# Patient Record
Sex: Female | Born: 1935 | Race: White | Hispanic: No | Smoking: Never smoker
Health system: Southern US, Community
[De-identification: ages and names within clinical notes are randomized; demographics above are authoritative.]

## PROBLEM LIST (undated history)

## (undated) ENCOUNTER — Emergency Department (HOSPITAL_COMMUNITY): Admission: EM | Payer: No Typology Code available for payment source

## (undated) DIAGNOSIS — Z9889 Other specified postprocedural states: Secondary | ICD-10-CM

## (undated) DIAGNOSIS — Z87440 Personal history of urinary (tract) infections: Secondary | ICD-10-CM

## (undated) DIAGNOSIS — Z973 Presence of spectacles and contact lenses: Secondary | ICD-10-CM

## (undated) DIAGNOSIS — K219 Gastro-esophageal reflux disease without esophagitis: Secondary | ICD-10-CM

## (undated) DIAGNOSIS — IMO0002 Reserved for concepts with insufficient information to code with codable children: Secondary | ICD-10-CM

## (undated) DIAGNOSIS — Z8042 Family history of malignant neoplasm of prostate: Secondary | ICD-10-CM

## (undated) DIAGNOSIS — Z860100 Personal history of colon polyps, unspecified: Secondary | ICD-10-CM

## (undated) DIAGNOSIS — G8929 Other chronic pain: Secondary | ICD-10-CM

## (undated) DIAGNOSIS — N312 Flaccid neuropathic bladder, not elsewhere classified: Secondary | ICD-10-CM

## (undated) DIAGNOSIS — E785 Hyperlipidemia, unspecified: Secondary | ICD-10-CM

## (undated) DIAGNOSIS — K449 Diaphragmatic hernia without obstruction or gangrene: Secondary | ICD-10-CM

## (undated) DIAGNOSIS — R112 Nausea with vomiting, unspecified: Secondary | ICD-10-CM

## (undated) DIAGNOSIS — N183 Chronic kidney disease, stage 3 unspecified: Secondary | ICD-10-CM

## (undated) DIAGNOSIS — Z923 Personal history of irradiation: Secondary | ICD-10-CM

## (undated) DIAGNOSIS — M545 Low back pain, unspecified: Secondary | ICD-10-CM

## (undated) DIAGNOSIS — C50411 Malignant neoplasm of upper-outer quadrant of right female breast: Principal | ICD-10-CM

## (undated) DIAGNOSIS — Z978 Presence of other specified devices: Secondary | ICD-10-CM

## (undated) DIAGNOSIS — Z803 Family history of malignant neoplasm of breast: Secondary | ICD-10-CM

## (undated) DIAGNOSIS — E119 Type 2 diabetes mellitus without complications: Secondary | ICD-10-CM

## (undated) DIAGNOSIS — R339 Retention of urine, unspecified: Secondary | ICD-10-CM

## (undated) DIAGNOSIS — Z800 Family history of malignant neoplasm of digestive organs: Secondary | ICD-10-CM

## (undated) DIAGNOSIS — I452 Bifascicular block: Secondary | ICD-10-CM

## (undated) DIAGNOSIS — D649 Anemia, unspecified: Secondary | ICD-10-CM

## (undated) DIAGNOSIS — Z974 Presence of external hearing-aid: Secondary | ICD-10-CM

## (undated) DIAGNOSIS — K5790 Diverticulosis of intestine, part unspecified, without perforation or abscess without bleeding: Secondary | ICD-10-CM

## (undated) DIAGNOSIS — M1990 Unspecified osteoarthritis, unspecified site: Secondary | ICD-10-CM

## (undated) DIAGNOSIS — Z972 Presence of dental prosthetic device (complete) (partial): Secondary | ICD-10-CM

## (undated) DIAGNOSIS — R600 Localized edema: Secondary | ICD-10-CM

## (undated) DIAGNOSIS — Z960 Presence of urogenital implants: Secondary | ICD-10-CM

## (undated) HISTORY — PX: BACK SURGERY: SHX140

## (undated) HISTORY — DX: Family history of malignant neoplasm of prostate: Z80.42

## (undated) HISTORY — DX: Diverticulosis of intestine, part unspecified, without perforation or abscess without bleeding: K57.90

## (undated) HISTORY — DX: Family history of malignant neoplasm of digestive organs: Z80.0

## (undated) HISTORY — DX: Gastro-esophageal reflux disease without esophagitis: K21.9

## (undated) HISTORY — PX: BREAST SURGERY: SHX581

## (undated) HISTORY — PX: TOTAL KNEE ARTHROPLASTY: SHX125

## (undated) HISTORY — DX: Unspecified osteoarthritis, unspecified site: M19.90

## (undated) HISTORY — DX: Malignant neoplasm of upper-outer quadrant of right female breast: C50.411

## (undated) HISTORY — PX: JOINT REPLACEMENT: SHX530

## (undated) HISTORY — DX: Reserved for concepts with insufficient information to code with codable children: IMO0002

## (undated) HISTORY — DX: Family history of malignant neoplasm of breast: Z80.3

## (undated) HISTORY — PX: HERNIA REPAIR: SHX51

## (undated) HISTORY — PX: CATARACT EXTRACTION W/ INTRAOCULAR LENS  IMPLANT, BILATERAL: SHX1307

## (undated) HISTORY — PX: EYE SURGERY: SHX253

## (undated) NOTE — *Deleted (*Deleted)
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  History of chronic back pain with decompression microdiscectomy 2017.  Per chart review patient lives with spouse.  1 level home with ramped entrance.  Husband is limited physically.  Daughter does assist both both patient and her father mid afternoon to dinnertime daily.  Patient used a rolling walker prior to admission.  Patient has been at [facility] in the past for debility.  Presented 04/24/2020 with complaints of increasing back pain radiating to the lower extremities.  MRI and imaging lumbar showed multilevel lumbar spinal listhesis and spinal stenosis.  Patient underwent bilateral redo of L2-3 3-4 and L4-5 laminotomy/laminectomy/foraminotomies to decompress the bilateral L3-4 and L5 nerve roots as well as transforaminal lumbar interbody fusion 04/24/2020 per Dr. Arnoldo Morale.  Back brace when out of bed.  Hospital course anemia 6.9-7.5 as well as bouts of hypotension.  Bouts of hypoglycemia 04/28/2020 of 35 and received diabetic gel.  Therapy evaluations completed and patient was admitted for a comprehensive rehab program.  Review of Systems  Constitutional: Negative for chills and fever.  HENT: Negative for hearing loss.   Eyes: Negative for blurred vision, double vision and discharge.  Respiratory: Negative for cough and shortness of breath.   Cardiovascular: Positive for leg swelling. Negative for chest pain.  Gastrointestinal: Positive for constipation. Negative for heartburn, nausea and vomiting.       GERD  Genitourinary: Negative for  dysuria, flank pain and hematuria.       Bouts of urinary incontinence  Musculoskeletal: Positive for back pain, joint pain and myalgias.  Skin: Negative for rash.  All other systems reviewed and are negative.  Past Medical History:  Diagnosis Date  . Anemia   . Arthritis   . Bilateral edema of lower extremity   . 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 Chronic lower back pain   . CKD (chronic kidney disease) stage 3, GFR 30-59 ml/min (HCC)   . DDD (degenerative disc disease)    lumbar  . Diverticulosis   . Family history of breast cancer   . Family history of pancreatic cancer   . Family history of prostate cancer   . Flaccid neuropathic bladder, not elsewhere classified   . Foley catheter in place   . GERD (gastroesophageal reflux disease)   . Hemorrhoids   . Hiatal hernia   . History of colon polyps   . History of radiation therapy 09-12-2015 to 10-12-2015   42.72 gray in 16 fractions directed right breast w/ boost of 12 gray in 6 fractions directed at the lumpectomy cavity- Total dose: 54.72y  . History of recurrent UTIs   . Hyperlipidemia   . PONV (postoperative nausea and vomiting)   . RBBB (right bundle branch block with left anterior fascicular block)   . Type 2 diabetes mellitus ([place])   . Urinary retention with incomplete bladder emptying   . Wears dentures    full upper  and lower partial  . Wears glasses   . Wears hearing aid    bilateral but does not wear   Past Surgical History:  Procedure Laterality Date  . BACK SURGERY    . BREAST LUMPECTOMY WITH RADIOACTIVE SEED LOCALIZATION Right 08/03/2015   Procedure: BREAST LUMPECTOMY WITH RADIOACTIVE SEED LOCALIZATION;  Surgeon: [name], MD;  Location: [facility];  Service: General;   Laterality: Right;  . BREAST SURGERY    . CARDIOVASCULAR STRESS TEST  01/14/2012   Low risk nuclear study w/ small mild apical and apical lateral reversible perfusion defect represents a small area of ischemia versus shifting breast artifact/  normal LV funciton and wall motion, ef 86%  . CARPAL TUNNEL RELEASE Right 1977  . CATARACT EXTRACTION W/ INTRAOCULAR LENS  IMPLANT, BILATERAL Bilateral left 1996/  right 1997  . CLOSED RIGHT KNEE MANIPULATION  08/11/2002   post TKA  . CYSTOSTOMY N/A 05/17/2016   Procedure: CYSTOSCOPY WITH  SUPRAPUBIC PLACMENT;  Surgeon: [name], MD;  Location: [facility];  Service: Urology;  Laterality: N/A;  . EYE SURGERY    . GANGLION CYST EXCISION Right 12/09/2001   right palm  . HERNIA REPAIR    . JOINT REPLACEMENT    . KNEE ARTHROSCOPY Right 12/14/2002   w/ Lysis Adhesions  . LUMBAR LAMINECTOMY/DECOMPRESSION MICRODISCECTOMY N/A 03/06/2016   Procedure: CENTRAL DECOMPRESSION L3 - L4 ,L4 - L5;  Surgeon: [name], MD;  Location: [facility];  Service: Orthopedics;  Laterality: N/A;  . SHOULDER HEMI-ARTHROPLASTY Right 02/20/2009   avascular necrosis   . TOTAL KNEE ARTHROPLASTY Bilateral right 06-23-2002/  left 07-11-2008  . [place]  . VAGINAL HYSTERECTOMY  1975   Family History  Problem Relation Age of Onset  . Pancreatic cancer Mother 50  . Diabetes Mother   . Prostate cancer Brother   . Diabetes Father   . Heart disease Father   . Diabetes Brother   . Heart attack Brother   . Diabetes Sister   . Diabetes Daughter   . Diabetes Son   . Coronary artery disease Brother        MI in his 64s  . Colon cancer Neg Hx   . Stomach cancer Neg Hx    Social History:  reports that she has never smoked. She has never used smokeless tobacco. She reports that she does not drink alcohol and does not use drugs. Allergies:  Allergies  Allergen Reactions  . Phenergan [Promethazine]     Slurred speech, acted very confused    Medications Prior to Admission  Medication Sig Dispense Refill  . acetaminophen (TYLENOL) 500 MG tablet Take 500 mg by mouth every 6 (six) hours as needed for mild pain or moderate pain.     Marland Kitchen atorvastatin (LIPITOR) 10 MG tablet Take 1 tablet (10 mg total) by mouth daily. 30 tablet 0  . bisacodyl (DULCOLAX) 10 MG suppository Place 1 suppository (10 mg total) rectally daily as needed for moderate constipation. 12 suppository 0  . esomeprazole (NEXIUM) 40 MG capsule TAKE 1 CAPSULE(40 MG) BY MOUTH DAILY (Patient taking differently: Take 40 mg by mouth daily. ) 90 capsule 1  . furosemide (LASIX) 40 MG tablet Take 1 tablet (40 mg total) by mouth daily. Or as directed 30 tablet 0  . gabapentin (NEURONTIN) 300 MG capsule Take 1 capsule (300 mg total) by mouth at bedtime. 90 capsule 1  . glipiZIDE (GLUCOTROL) 10 MG tablet Take 1 tablet (10 mg  total) by mouth in the morning. 90 tablet 3  . ondansetron (ZOFRAN-ODT) 4 MG disintegrating tablet Take 1 tablet (4 mg total) by mouth every 8 (eight) hours as needed for nausea or vomiting. 15 tablet 0  . Potassium 99 MG TABS Take 1 tablet (99 mg total) by mouth daily. 30 tablet 0  . traMADol (ULTRAM) 50 MG tablet Take 50 mg by mouth 2 (two) times daily.    Marland Kitchen aspirin EC 81 MG tablet Take 81 mg by mouth daily at 12 noon.     . Cholecalciferol (VITAMIN D) 50 MCG (2000 UT) tablet Take 2,000 Units by mouth daily.    . cyanocobalamin 1000 MCG tablet Take 1,000 mcg by mouth at bedtime.     Marland Kitchen glipiZIDE (GLUCOTROL XL) 5 MG 24 hr tablet Take 1 tablet (5 mg total) by mouth every evening. (Patient not taking: Reported on 04/25/2020) 30 tablet 0  . Multiple Vitamins-Minerals (PRESERVISION AREDS PO) Take 1 capsule by mouth in the morning and at bedtime.    . polyethylene glycol (MIRALAX / GLYCOLAX) 17 g packet Take 17 g by mouth daily as needed for moderate constipation or severe constipation. (Patient not taking: Reported on 04/25/2020) 14 each 0    Drug Regimen Review Drug  regimen was reviewed and remains appropriate with no significant issues identified  Home: Home Living Family/patient expects to be discharged to:: Private residence Living Arrangements: Spouse/significant other Available Help at Discharge: Family, Available 24 hours/day Type of Home: House Home Access: Ramped entrance Home Layout: One level Bathroom Shower/Tub: Multimedia programmer: Handicapped height Home Equipment: Environmental consultant - 2 wheels, Bedside commode, Shower seat Additional Comments: reports daughter can help a few days   Functional History: Prior Function Level of Independence: Needs assistance Gait / Transfers Assistance Needed: pt reports walking with RW since d/c from SNF a few months ago ADL's / Homemaking Assistance Needed: Pt reports assist for sponge bathing on posterior aspect of body, husband/daughter do cleaning and meal prep Comments: Pt reports 0 falls  Functional Status:  Mobility: Bed Mobility Overal bed mobility: Needs Assistance Bed Mobility: Supine to Sit, Rolling Rolling: Min assist Supine to sit: Mod assist Sit to sidelying: Min assist General bed mobility comments: Pt was received sitting up in the recliner.  Transfers Overall transfer level: Needs assistance Equipment used: Rolling walker (2 wheeled) Transfers: Sit to/from Stand, [employer] Transfers Sit to Stand: Min assist Stand pivot transfers: Min assist General transfer comment: VC's for hand placement on seated surface for safety. Pt with difficulty scooting out to prepare for stand, and with increased effort to transition hands from arm rests of chair to the walker.  Ambulation/Gait Ambulation/Gait assistance: Min guard, Min assist Gait Distance (Feet): 50 Feet Assistive device: Rolling walker (2 wheeled) Gait Pattern/deviations: Step-through pattern, Decreased stride length, Shuffle, Trunk flexed General Gait Details: Min guard with occasional min assist provided for knee buckling  episodes. Pt reports difficulty advancing the RW and assist provided for pain control - especially during turns.  Gait velocity: Decreased Gait velocity interpretation: <1.31 ft/sec, indicative of household ambulator    ADL: ADL Overall ADL's : Needs assistance/impaired Eating/Feeding: Set up, Bed level Eating/Feeding Details (indicate cue type and reason): supervision for safety as pt continues to be lethargic although dtr states pt always talks with her eyes closed Grooming: Supervision/safety, Set up, Sitting, Standing Grooming Details (indicate cue type and reason): education on not bending for ADLs, pt attempted to stand at sink for oral care but noted  to lean on sink. deferred oral care to sitting in recliner. Upper Body Bathing: Moderate assistance Lower Body Bathing: Total assistance Lower Body Bathing Details (indicate cue type and reason): education provided on not bending, however pt states she does sponge baths Upper Body Dressing : Sitting, Total assistance Upper Body Dressing Details (indicate cue type and reason): unable to don brace , total (A)  Lower Body Dressing: Maximal assistance, Sit to/from stand, Cueing for safety, Cueing for sequencing, With adaptive equipment Lower Body Dressing Details (indicate cue type and reason): pt able to don pants with reacher with MAX A. pt required cues for sequencing of task and overall technique. pt reports she prefers to wear gowns at home Toilet Transfer: Minimal assistance, RW, Ambulation Toilet Transfer Details (indicate cue type and reason): cues for RW mgmt and cues to remind pt to keep eyes open during ambulation. pt reports she rarely sits on the toilet d/t her catheter and wearing diaper. Toileting - Clothing Manipulation Details (indicate cue type and reason): education and visual demo of maintaining back precautions during posterior pericare. pt able to demo lateral leans from recliner but would likely need MAX A for cleanliness.  issued dtr and pt education on availbale DME for pericare with pt and dtr verbalizing understanding Tub/Shower Transfer Details (indicate cue type and reason): pt reports completing sponge baths at home Functional mobility during ADLs: Minimal assistance, Cueing for safety, Rolling walker General ADL Comments: pt continues to be limited by lethargy, pain, back precautions and decreased ability to care for self impacting pts ability to complete BADLs  Cognition: Cognition Overall Cognitive Status: Difficult to assess Orientation Level: Oriented X4 Cognition Arousal/Alertness: Lethargic Behavior During Therapy: Flat affect Overall Cognitive Status: Difficult to assess General Comments: Limited verbalizations throughout session with eyes closed often. Difficult to assess due to: Hard of hearing/deaf  Physical Exam: Blood pressure (!) 102/53, pulse 99, temperature 98.8 F (37.1 C), temperature source Axillary, resp. rate 16, height 5' 0.5" (1.537 m), weight 66.7 kg, SpO2 93 %. Physical Exam Skin:    Comments: Back incision is dressed.  Neurological:     Comments: Patient is alert.  Complains of back pain.  Makes eye contact with examiner.  Oriented x3 and follows commands.     Results for orders placed or performed during the hospital encounter of 04/24/20 (from the past 48 hour(s))  CBC with Differential/Platelet     Status: Abnormal   Collection Time: 04/26/20  7:36 AM  Result Value Ref Range   WBC 8.3 4.0 - 10.5 K/uL   RBC 2.56 (L) 3.87 - 5.11 MIL/uL   Hemoglobin 7.5 (L) 12.0 - 15.0 g/dL   HCT 23.7 (L) 36 - 46 %   MCV 92.6 80.0 - 100.0 fL   MCH 29.3 26.0 - 34.0 pg   MCHC 31.6 30.0 - 36.0 g/dL   RDW 14.2 11.5 - 15.5 %   Platelets 163 150 - 400 K/uL   nRBC 0.0 0.0 - 0.2 %   Neutrophils Relative % 69 %   Neutro Abs 5.8 1.7 - 7.7 K/uL   Lymphocytes Relative 21 %   Lymphs Abs 1.7 0.7 - 4.0 K/uL   Monocytes Relative 8 %   Monocytes Absolute 0.7 0.1 - 1.0 K/uL   Eosinophils  Relative 1 %   Eosinophils Absolute 0.0 0.0 - 0.5 K/uL   Basophils Relative 0 %   Basophils Absolute 0.0 0.0 - 0.1 K/uL   Immature Granulocytes 1 %   Abs Immature Granulocytes 0.04  0.00 - 0.07 K/uL    Comment: Performed at [facility], [place] [address], [place], [place] [address]  Glucose, capillary     Status: Abnormal   Collection Time: 04/26/20 11:23 AM  Result Value Ref Range   Glucose-Capillary 148 (H) 70 - 99 mg/dL    Comment: Glucose reference range applies only to samples taken after fasting for at least 8 hours.  Glucose, capillary     Status: Abnormal   Collection Time: 04/26/20  5:39 PM  Result Value Ref Range   Glucose-Capillary 163 (H) 70 - 99 mg/dL    Comment: Glucose reference range applies only to samples taken after fasting for at least 8 hours.  Glucose, capillary     Status: Abnormal   Collection Time: 04/26/20  9:52 PM  Result Value Ref Range   Glucose-Capillary 154 (H) 70 - 99 mg/dL    Comment: Glucose reference range applies only to samples taken after fasting for at least 8 hours.   Comment 1 Notify RN    Comment 2 Document in Chart   Glucose, capillary     Status: Abnormal   Collection Time: 04/27/20  6:25 AM  Result Value Ref Range   Glucose-Capillary 156 (H) 70 - 99 mg/dL    Comment: Glucose reference range applies only to samples taken after fasting for at least 8 hours.   Comment 1 Notify RN    Comment 2 Document in Chart    No results found.     Medical Problem List and Plan: 1.  Decreased functional mobility secondary to lumbar spondylolisthesis/spinal stenosis/radiculopathy.  Status post bilateral redo L2-3 3-4 and L4-5 laminotomy foraminotomies and decompression with transforaminal lumbar interbody fusion 04/24/2020.  Back brace when out of bed.  -patient may *** shower  -ELOS/Goals: *** 2.  Antithrombotics: -DVT/anticoagulation: SCDs.  Check vascular study  -antiplatelet therapy: N/A 3. Pain Management: Neurontin 300 mg nightly,  tramadol 50 mg twice daily, Flexeril and hydrocodone as needed 4. Mood: Provide emotional support  -antipsychotic agents: N/A 5. Neuropsych: This patient is capable of making decisions on her own behalf. 6. Skin/Wound Care: Routine skin checks 7. Fluids/Electrolytes/Nutrition: Routine in and outs with follow-up chemistries 8.  Acute blood loss anemia.  Continue iron supplement.  Follow-up CBC 9.  Type 2 diabetes mellitus.  Glucotrol 10 mg every morning 5 mg every evening.  Monitor blood sugars closely. 10.  CKD stage III.  Creatinine baseline 1.43.  Follow-up chemistries 11.  Flaccid neurogenic bladder.  Patient with chronic Foley tube.DO NOT REMOVE FOLEY TUBE. 12.  Hypertension.  Lasix 40 mg daily.  Monitor with increased mobility 13.  GERD.  Protonix 14.  Hyperlipidemia.  Lipitor 15.  History of right breast cancer with lumpectomy and radiation 2017.  Follow-up outpatient oncology 16.  Constipation.  MiraLAX as needed. ***  Lavon Paganini Ell Tiso, PA-C 04/28/2020

---
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
## 1973-07-08 HISTORY — PX: VAGINAL HYSTERECTOMY: SUR661

## 1975-07-09 HISTORY — PX: CARPAL TUNNEL RELEASE: SHX101

## 1998-08-28 ENCOUNTER — Other Ambulatory Visit: Admission: RE | Admit: 1998-08-28 | Discharge: 1998-08-28 | Payer: Self-pay | Admitting: Obstetrics and Gynecology

## 1998-09-05 ENCOUNTER — Encounter: Payer: Self-pay | Admitting: Obstetrics and Gynecology

## 1998-09-05 ENCOUNTER — Ambulatory Visit (HOSPITAL_COMMUNITY): Admission: RE | Admit: 1998-09-05 | Discharge: 1998-09-05 | Payer: Self-pay | Admitting: Obstetrics and Gynecology

## 1998-09-25 ENCOUNTER — Ambulatory Visit: Admission: RE | Admit: 1998-09-25 | Discharge: 1998-09-25 | Payer: Self-pay | Admitting: Family Medicine

## 1999-10-02 ENCOUNTER — Other Ambulatory Visit: Admission: RE | Admit: 1999-10-02 | Discharge: 1999-10-02 | Payer: Self-pay | Admitting: Family Medicine

## 2001-12-09 ENCOUNTER — Ambulatory Visit (HOSPITAL_BASED_OUTPATIENT_CLINIC_OR_DEPARTMENT_OTHER): Admission: RE | Admit: 2001-12-09 | Discharge: 2001-12-09 | Payer: Self-pay | Admitting: *Deleted

## 2001-12-09 ENCOUNTER — Encounter (INDEPENDENT_AMBULATORY_CARE_PROVIDER_SITE_OTHER): Payer: Self-pay | Admitting: *Deleted

## 2001-12-09 HISTORY — PX: GANGLION CYST EXCISION: SHX1691

## 2002-06-18 ENCOUNTER — Encounter: Payer: Self-pay | Admitting: Orthopedic Surgery

## 2002-06-23 ENCOUNTER — Inpatient Hospital Stay (HOSPITAL_COMMUNITY): Admission: RE | Admit: 2002-06-23 | Discharge: 2002-06-29 | Payer: Self-pay | Admitting: Orthopedic Surgery

## 2002-07-19 ENCOUNTER — Encounter: Admission: RE | Admit: 2002-07-19 | Discharge: 2002-10-17 | Payer: Self-pay | Admitting: Orthopedic Surgery

## 2002-08-11 ENCOUNTER — Ambulatory Visit (HOSPITAL_COMMUNITY): Admission: RE | Admit: 2002-08-11 | Discharge: 2002-08-12 | Payer: Self-pay | Admitting: Orthopedic Surgery

## 2002-08-11 HISTORY — PX: OTHER SURGICAL HISTORY: SHX169

## 2002-10-18 ENCOUNTER — Encounter: Admission: RE | Admit: 2002-10-18 | Discharge: 2002-11-15 | Payer: Self-pay | Admitting: Orthopedic Surgery

## 2002-12-14 HISTORY — PX: KNEE ARTHROSCOPY: SUR90

## 2002-12-15 ENCOUNTER — Inpatient Hospital Stay (HOSPITAL_COMMUNITY): Admission: EM | Admit: 2002-12-15 | Discharge: 2002-12-17 | Payer: Self-pay | Admitting: Orthopedic Surgery

## 2002-12-20 ENCOUNTER — Encounter: Admission: RE | Admit: 2002-12-20 | Discharge: 2003-02-21 | Payer: Self-pay | Admitting: Orthopedic Surgery

## 2003-09-28 ENCOUNTER — Other Ambulatory Visit: Admission: RE | Admit: 2003-09-28 | Discharge: 2003-09-28 | Payer: Self-pay | Admitting: Family Medicine

## 2004-05-24 ENCOUNTER — Ambulatory Visit: Payer: Self-pay | Admitting: Family Medicine

## 2004-10-02 ENCOUNTER — Ambulatory Visit: Payer: Self-pay | Admitting: Family Medicine

## 2004-10-08 ENCOUNTER — Encounter: Payer: Self-pay | Admitting: Cardiology

## 2004-10-09 ENCOUNTER — Ambulatory Visit: Payer: Self-pay

## 2004-10-25 ENCOUNTER — Ambulatory Visit: Payer: Self-pay | Admitting: Family Medicine

## 2005-05-11 ENCOUNTER — Ambulatory Visit: Payer: Self-pay | Admitting: Family Medicine

## 2005-10-03 ENCOUNTER — Other Ambulatory Visit: Admission: RE | Admit: 2005-10-03 | Discharge: 2005-10-03 | Payer: Self-pay | Admitting: Family Medicine

## 2005-10-03 ENCOUNTER — Encounter: Payer: Self-pay | Admitting: Family Medicine

## 2005-10-03 ENCOUNTER — Ambulatory Visit: Payer: Self-pay | Admitting: Family Medicine

## 2005-10-10 ENCOUNTER — Encounter: Payer: Self-pay | Admitting: Family Medicine

## 2005-10-10 LAB — CONVERTED CEMR LAB: Pap Smear: NORMAL

## 2005-11-21 LAB — CONVERTED CEMR LAB
OCCULT 1: POSITIVE
OCCULT 2: NEGATIVE
OCCULT 3: POSITIVE

## 2005-11-22 ENCOUNTER — Ambulatory Visit: Payer: Self-pay | Admitting: Family Medicine

## 2006-03-24 ENCOUNTER — Encounter: Payer: Self-pay | Admitting: Family Medicine

## 2006-04-01 ENCOUNTER — Ambulatory Visit: Payer: Self-pay | Admitting: Family Medicine

## 2006-10-07 ENCOUNTER — Ambulatory Visit: Payer: Self-pay | Admitting: Family Medicine

## 2006-10-07 LAB — CONVERTED CEMR LAB
ALT: 17 units/L (ref 0–40)
AST: 20 units/L (ref 0–37)
Albumin: 4 g/dL (ref 3.5–5.2)
Alkaline Phosphatase: 55 units/L (ref 39–117)
BUN: 19 mg/dL (ref 6–23)
Basophils Absolute: 0 10*3/uL (ref 0.0–0.1)
Basophils Relative: 0.7 % (ref 0.0–1.0)
Bilirubin, Direct: 0.1 mg/dL (ref 0.0–0.3)
CO2: 32 meq/L (ref 19–32)
Calcium: 10 mg/dL (ref 8.4–10.5)
Chloride: 100 meq/L (ref 96–112)
Cholesterol: 192 mg/dL (ref 0–200)
Creatinine, Ser: 1.1 mg/dL (ref 0.4–1.2)
Creatinine,U: 160.1 mg/dL
Direct LDL: 95.4 mg/dL
Eosinophils Absolute: 0.2 10*3/uL (ref 0.0–0.6)
Eosinophils Relative: 2.8 % (ref 0.0–5.0)
GFR calc Af Amer: 62 mL/min
GFR calc non Af Amer: 52 mL/min
Glucose, Bld: 114 mg/dL — ABNORMAL HIGH (ref 70–99)
HCT: 40.8 % (ref 36.0–46.0)
HDL: 48 mg/dL (ref >39.0)
Hemoglobin: 14.1 g/dL (ref 12.0–15.0)
Hgb A1c MFr Bld: 5.8 % (ref 4.6–6.0)
Lymphocytes Relative: 36.4 % (ref 12.0–46.0)
MCHC: 34.6 g/dL (ref 30.0–36.0)
MCV: 87.7 fL (ref 78.0–100.0)
Microalb Creat Ratio: 2.5 mg/g (ref 0.0–30.0)
Microalb, Ur: 0.4 mg/dL (ref 0.0–1.9)
Monocytes Absolute: 0.5 10*3/uL (ref 0.2–0.7)
Monocytes Relative: 8.9 % (ref 3.0–11.0)
Neutro Abs: 3.1 10*3/uL (ref 1.4–7.7)
Neutrophils Relative %: 51.2 % (ref 43.0–77.0)
Platelets: 253 10*3/uL (ref 150–400)
Potassium: 4.2 meq/L (ref 3.5–5.1)
RBC: 4.65 M/uL (ref 3.87–5.11)
RDW: 12.5 % (ref 11.5–14.6)
Sodium: 139 meq/L (ref 135–145)
TSH: 2.21 microintl units/mL (ref 0.35–5.50)
Total Bilirubin: 0.8 mg/dL (ref 0.3–1.2)
Total CHOL/HDL Ratio: 4
Total Protein: 7.9 g/dL (ref 6.0–8.3)
Triglycerides: 259 mg/dL (ref 0–149)
VLDL: 52 mg/dL — ABNORMAL HIGH (ref 0–40)
WBC: 5.9 10*3/uL (ref 4.5–10.5)

## 2006-11-07 ENCOUNTER — Encounter: Payer: Self-pay | Admitting: Family Medicine

## 2006-11-07 DIAGNOSIS — R42 Dizziness and giddiness: Secondary | ICD-10-CM | POA: Insufficient documentation

## 2006-11-07 DIAGNOSIS — E785 Hyperlipidemia, unspecified: Secondary | ICD-10-CM | POA: Insufficient documentation

## 2006-11-07 DIAGNOSIS — K219 Gastro-esophageal reflux disease without esophagitis: Secondary | ICD-10-CM | POA: Insufficient documentation

## 2006-11-10 ENCOUNTER — Ambulatory Visit: Payer: Self-pay | Admitting: Gastroenterology

## 2006-11-27 ENCOUNTER — Ambulatory Visit: Payer: Self-pay | Admitting: Gastroenterology

## 2006-11-27 LAB — HM COLONOSCOPY

## 2006-12-07 LAB — HM MAMMOGRAPHY

## 2007-03-10 ENCOUNTER — Telehealth: Payer: Self-pay | Admitting: Family Medicine

## 2007-03-26 ENCOUNTER — Encounter: Payer: Self-pay | Admitting: Family Medicine

## 2007-03-27 ENCOUNTER — Encounter: Payer: Self-pay | Admitting: Family Medicine

## 2007-04-07 ENCOUNTER — Encounter: Payer: Self-pay | Admitting: Family Medicine

## 2007-04-30 ENCOUNTER — Ambulatory Visit: Payer: Self-pay | Admitting: Family Medicine

## 2007-10-08 ENCOUNTER — Other Ambulatory Visit: Admission: RE | Admit: 2007-10-08 | Discharge: 2007-10-08 | Payer: Self-pay | Admitting: Family Medicine

## 2007-10-08 ENCOUNTER — Ambulatory Visit: Payer: Self-pay | Admitting: Family Medicine

## 2007-10-08 ENCOUNTER — Encounter: Payer: Self-pay | Admitting: Family Medicine

## 2007-10-08 DIAGNOSIS — M899 Disorder of bone, unspecified: Secondary | ICD-10-CM | POA: Insufficient documentation

## 2007-10-08 DIAGNOSIS — M1990 Unspecified osteoarthritis, unspecified site: Secondary | ICD-10-CM | POA: Insufficient documentation

## 2007-10-08 DIAGNOSIS — D649 Anemia, unspecified: Secondary | ICD-10-CM | POA: Insufficient documentation

## 2007-10-08 DIAGNOSIS — T50995A Adverse effect of other drugs, medicaments and biological substances, initial encounter: Secondary | ICD-10-CM | POA: Insufficient documentation

## 2007-10-08 DIAGNOSIS — R609 Edema, unspecified: Secondary | ICD-10-CM | POA: Insufficient documentation

## 2007-10-08 DIAGNOSIS — E039 Hypothyroidism, unspecified: Secondary | ICD-10-CM | POA: Insufficient documentation

## 2007-10-08 DIAGNOSIS — M129 Arthropathy, unspecified: Secondary | ICD-10-CM

## 2007-10-08 DIAGNOSIS — M940 Chondrocostal junction syndrome [Tietze]: Secondary | ICD-10-CM | POA: Insufficient documentation

## 2007-10-08 DIAGNOSIS — M949 Disorder of cartilage, unspecified: Secondary | ICD-10-CM

## 2007-10-08 DIAGNOSIS — N390 Urinary tract infection, site not specified: Secondary | ICD-10-CM | POA: Insufficient documentation

## 2007-10-08 LAB — CONVERTED CEMR LAB: Pap Smear: NORMAL

## 2007-10-15 ENCOUNTER — Encounter: Payer: Self-pay | Admitting: Family Medicine

## 2007-10-21 ENCOUNTER — Ambulatory Visit: Payer: Self-pay | Admitting: Family Medicine

## 2007-10-21 LAB — CONVERTED CEMR LAB
OCCULT 1: NEGATIVE
OCCULT 2: NEGATIVE
OCCULT 3: NEGATIVE

## 2007-10-22 ENCOUNTER — Encounter: Payer: Self-pay | Admitting: Family Medicine

## 2007-10-29 ENCOUNTER — Encounter: Payer: Self-pay | Admitting: Family Medicine

## 2007-12-11 ENCOUNTER — Telehealth: Payer: Self-pay | Admitting: Family Medicine

## 2007-12-15 ENCOUNTER — Ambulatory Visit: Payer: Self-pay | Admitting: Family Medicine

## 2007-12-24 LAB — CONVERTED CEMR LAB: Vit D, 1,25-Dihydroxy: 42 (ref 30–89)

## 2008-01-26 ENCOUNTER — Ambulatory Visit: Payer: Self-pay | Admitting: Family Medicine

## 2008-02-08 LAB — CONVERTED CEMR LAB: Hgb A1c MFr Bld: 5.8 % (ref 4.6–6.0)

## 2008-03-10 ENCOUNTER — Ambulatory Visit (HOSPITAL_COMMUNITY): Admission: RE | Admit: 2008-03-10 | Discharge: 2008-03-10 | Payer: Self-pay | Admitting: Orthopedic Surgery

## 2008-04-13 ENCOUNTER — Ambulatory Visit: Payer: Self-pay | Admitting: Family Medicine

## 2008-04-27 ENCOUNTER — Encounter: Payer: Self-pay | Admitting: Family Medicine

## 2008-05-18 ENCOUNTER — Ambulatory Visit: Payer: Self-pay | Admitting: Family Medicine

## 2008-06-07 ENCOUNTER — Ambulatory Visit: Payer: Self-pay | Admitting: Family Medicine

## 2008-07-11 ENCOUNTER — Inpatient Hospital Stay (HOSPITAL_COMMUNITY): Admission: RE | Admit: 2008-07-11 | Discharge: 2008-07-15 | Payer: Self-pay | Admitting: Orthopedic Surgery

## 2008-08-23 ENCOUNTER — Ambulatory Visit: Payer: Self-pay | Admitting: Family Medicine

## 2008-08-23 DIAGNOSIS — E0590 Thyrotoxicosis, unspecified without thyrotoxic crisis or storm: Secondary | ICD-10-CM | POA: Insufficient documentation

## 2008-08-23 DIAGNOSIS — Z96659 Presence of unspecified artificial knee joint: Secondary | ICD-10-CM | POA: Insufficient documentation

## 2008-08-23 DIAGNOSIS — M7150 Other bursitis, not elsewhere classified, unspecified site: Secondary | ICD-10-CM | POA: Insufficient documentation

## 2008-08-25 ENCOUNTER — Ambulatory Visit: Payer: Self-pay

## 2008-08-25 ENCOUNTER — Encounter: Payer: Self-pay | Admitting: Family Medicine

## 2008-08-25 LAB — CONVERTED CEMR LAB
Free T4: 0.6 ng/dL (ref 0.6–1.6)
T3 Uptake Ratio: 40.4 % — ABNORMAL HIGH (ref 22.5–37.0)
T3, Free: 2.7 pg/mL (ref 2.3–4.2)
T4, Total: 7.8 ug/dL (ref 5.0–12.5)
TSH: 1.34 microintl units/mL (ref 0.35–5.50)

## 2008-09-01 ENCOUNTER — Ambulatory Visit: Payer: Self-pay | Admitting: Family Medicine

## 2008-09-01 LAB — CONVERTED CEMR LAB: Hemoglobin: 12.6 g/dL

## 2008-10-17 ENCOUNTER — Encounter: Payer: Self-pay | Admitting: Family Medicine

## 2008-11-02 ENCOUNTER — Ambulatory Visit: Payer: Self-pay | Admitting: Family Medicine

## 2008-11-02 LAB — CONVERTED CEMR LAB
Blood in Urine, dipstick: NEGATIVE
Glucose, Urine, Semiquant: NEGATIVE
Nitrite: NEGATIVE
Specific Gravity, Urine: 1.025
Urobilinogen, UA: 0.2
WBC Urine, dipstick: NEGATIVE
pH: 5

## 2008-11-04 LAB — CONVERTED CEMR LAB
ALT: 14 units/L (ref 0–35)
AST: 16 units/L (ref 0–37)
Albumin: 3.9 g/dL (ref 3.5–5.2)
Alkaline Phosphatase: 78 units/L (ref 39–117)
BUN: 21 mg/dL (ref 6–23)
Basophils Absolute: 0 10*3/uL (ref 0.0–0.1)
Basophils Relative: 0.4 % (ref 0.0–3.0)
Bilirubin, Direct: 0 mg/dL (ref 0.0–0.3)
CO2: 33 meq/L — ABNORMAL HIGH (ref 19–32)
Calcium: 9.4 mg/dL (ref 8.4–10.5)
Chloride: 105 meq/L (ref 96–112)
Cholesterol: 207 mg/dL — ABNORMAL HIGH (ref 0–200)
Creatinine, Ser: 1.2 mg/dL (ref 0.4–1.2)
Creatinine,U: 273 mg/dL
Direct LDL: 114.2 mg/dL
Eosinophils Absolute: 0.2 10*3/uL (ref 0.0–0.7)
Eosinophils Relative: 3.2 % (ref 0.0–5.0)
GFR calc non Af Amer: 46.34 mL/min (ref >60)
Glucose, Bld: 121 mg/dL — ABNORMAL HIGH (ref 70–99)
HCT: 39.6 % (ref 36.0–46.0)
HDL: 45.2 mg/dL (ref >39.00)
Hemoglobin: 13.7 g/dL (ref 12.0–15.0)
Hgb A1c MFr Bld: 6.1 % (ref 4.6–6.5)
Lymphocytes Relative: 38.8 % (ref 12.0–46.0)
Lymphs Abs: 2 10*3/uL (ref 0.7–4.0)
MCHC: 34.6 g/dL (ref 30.0–36.0)
MCV: 87.1 fL (ref 78.0–100.0)
Microalb Creat Ratio: 3.7 mg/g (ref 0.0–30.0)
Microalb, Ur: 1 mg/dL (ref 0.0–1.9)
Monocytes Absolute: 0.4 10*3/uL (ref 0.1–1.0)
Monocytes Relative: 8.7 % (ref 3.0–12.0)
Neutro Abs: 2.5 10*3/uL (ref 1.4–7.7)
Neutrophils Relative %: 48.9 % (ref 43.0–77.0)
Platelets: 243 10*3/uL (ref 150.0–400.0)
Potassium: 4 meq/L (ref 3.5–5.1)
RBC: 4.55 M/uL (ref 3.87–5.11)
RDW: 14.5 % (ref 11.5–14.6)
Sodium: 143 meq/L (ref 135–145)
TSH: 2.31 microintl units/mL (ref 0.35–5.50)
Total Bilirubin: 0.9 mg/dL (ref 0.3–1.2)
Total CHOL/HDL Ratio: 5
Total Protein: 7.5 g/dL (ref 6.0–8.3)
Triglycerides: 207 mg/dL — ABNORMAL HIGH (ref 0.0–149.0)
VLDL: 41.4 mg/dL — ABNORMAL HIGH (ref 0.0–40.0)
Vit D, 25-Hydroxy: 29 ng/mL — ABNORMAL LOW (ref 30–89)
WBC: 5.1 10*3/uL (ref 4.5–10.5)

## 2008-11-21 ENCOUNTER — Ambulatory Visit: Payer: Self-pay | Admitting: Family Medicine

## 2008-12-07 ENCOUNTER — Telehealth (INDEPENDENT_AMBULATORY_CARE_PROVIDER_SITE_OTHER): Payer: Self-pay | Admitting: *Deleted

## 2008-12-19 ENCOUNTER — Encounter: Admission: RE | Admit: 2008-12-19 | Discharge: 2008-12-19 | Payer: Self-pay | Admitting: Specialist

## 2008-12-19 ENCOUNTER — Encounter: Payer: Self-pay | Admitting: Family Medicine

## 2008-12-22 ENCOUNTER — Ambulatory Visit: Payer: Self-pay | Admitting: Family Medicine

## 2008-12-22 ENCOUNTER — Telehealth: Payer: Self-pay | Admitting: Family Medicine

## 2008-12-22 DIAGNOSIS — F341 Dysthymic disorder: Secondary | ICD-10-CM | POA: Insufficient documentation

## 2008-12-22 DIAGNOSIS — M479 Spondylosis, unspecified: Secondary | ICD-10-CM | POA: Insufficient documentation

## 2008-12-22 DIAGNOSIS — M25519 Pain in unspecified shoulder: Secondary | ICD-10-CM | POA: Insufficient documentation

## 2009-01-20 ENCOUNTER — Ambulatory Visit: Payer: Self-pay | Admitting: Family Medicine

## 2009-01-20 LAB — CONVERTED CEMR LAB
OCCULT 1: NEGATIVE
OCCULT 2: NEGATIVE
OCCULT 3: NEGATIVE

## 2009-01-24 ENCOUNTER — Encounter: Payer: Self-pay | Admitting: Family Medicine

## 2009-01-25 ENCOUNTER — Ambulatory Visit: Payer: Self-pay | Admitting: Family Medicine

## 2009-02-20 ENCOUNTER — Ambulatory Visit (HOSPITAL_COMMUNITY): Admission: RE | Admit: 2009-02-20 | Discharge: 2009-02-22 | Payer: Self-pay | Admitting: Orthopedic Surgery

## 2009-02-20 HISTORY — PX: SHOULDER HEMI-ARTHROPLASTY: SHX5049

## 2009-05-17 ENCOUNTER — Ambulatory Visit: Payer: Self-pay | Admitting: Family Medicine

## 2009-06-10 ENCOUNTER — Encounter: Payer: Self-pay | Admitting: Family Medicine

## 2009-11-15 ENCOUNTER — Ambulatory Visit: Payer: Self-pay | Admitting: Family Medicine

## 2009-11-15 ENCOUNTER — Other Ambulatory Visit: Admission: RE | Admit: 2009-11-15 | Discharge: 2009-11-15 | Payer: Self-pay | Admitting: Family Medicine

## 2009-11-15 DIAGNOSIS — E119 Type 2 diabetes mellitus without complications: Secondary | ICD-10-CM | POA: Insufficient documentation

## 2009-11-15 DIAGNOSIS — E669 Obesity, unspecified: Secondary | ICD-10-CM | POA: Insufficient documentation

## 2009-11-15 DIAGNOSIS — E559 Vitamin D deficiency, unspecified: Secondary | ICD-10-CM | POA: Insufficient documentation

## 2009-11-15 LAB — HM PAP SMEAR

## 2009-12-06 ENCOUNTER — Ambulatory Visit: Payer: Self-pay | Admitting: Family Medicine

## 2009-12-25 ENCOUNTER — Encounter: Payer: Self-pay | Admitting: Family Medicine

## 2009-12-25 LAB — CONVERTED CEMR LAB
OCCULT 1: NEGATIVE
OCCULT 2: NEGATIVE
OCCULT 3: NEGATIVE

## 2010-04-24 ENCOUNTER — Ambulatory Visit: Payer: Self-pay | Admitting: Family Medicine

## 2010-06-11 ENCOUNTER — Encounter: Payer: Self-pay | Admitting: Family Medicine

## 2010-07-31 ENCOUNTER — Telehealth: Payer: Self-pay | Admitting: Family Medicine

## 2010-08-05 LAB — CONVERTED CEMR LAB
ALT: 12 units/L (ref 0–35)
ALT: 19 units/L (ref 0–35)
AST: 15 units/L (ref 0–37)
AST: 20 units/L (ref 0–37)
Albumin: 4 g/dL (ref 3.5–5.2)
Albumin: 4 g/dL (ref 3.5–5.2)
Alkaline Phosphatase: 58 units/L (ref 39–117)
Alkaline Phosphatase: 61 units/L (ref 39–117)
BUN: 21 mg/dL (ref 6–23)
BUN: 27 mg/dL — ABNORMAL HIGH (ref 6–23)
Basophils Absolute: 0 10*3/uL (ref 0.0–0.1)
Basophils Absolute: 0 10*3/uL (ref 0.0–0.1)
Basophils Relative: 0.8 % (ref 0.0–1.0)
Basophils Relative: 0.8 % (ref 0.0–3.0)
Bilirubin Urine: NEGATIVE
Bilirubin Urine: NEGATIVE
Bilirubin, Direct: 0.1 mg/dL (ref 0.0–0.3)
Bilirubin, Direct: 0.1 mg/dL (ref 0.0–0.3)
Blood in Urine, dipstick: NEGATIVE
Blood in Urine, dipstick: NEGATIVE
CO2: 31 meq/L (ref 19–32)
CO2: 33 meq/L — ABNORMAL HIGH (ref 19–32)
Calcium: 9.5 mg/dL (ref 8.4–10.5)
Calcium: 9.7 mg/dL (ref 8.4–10.5)
Chloride: 100 meq/L (ref 96–112)
Chloride: 101 meq/L (ref 96–112)
Cholesterol: 202 mg/dL — ABNORMAL HIGH (ref 0–200)
Cholesterol: 209 mg/dL (ref 0–200)
Creatinine, Ser: 1.1 mg/dL (ref 0.4–1.2)
Creatinine, Ser: 1.1 mg/dL (ref 0.4–1.2)
Creatinine,U: 218.9 mg/dL
Creatinine,U: 225.6 mg/dL
Direct LDL: 110.7 mg/dL
Direct LDL: 86.1 mg/dL
Eosinophils Absolute: 0.1 10*3/uL (ref 0.0–0.7)
Eosinophils Absolute: 0.2 10*3/uL (ref 0.0–0.7)
Eosinophils Relative: 2.8 % (ref 0.0–5.0)
Eosinophils Relative: 3.4 % (ref 0.0–5.0)
GFR calc Af Amer: 62 mL/min
GFR calc non Af Amer: 51 mL/min
GFR calc non Af Amer: 51.09 mL/min (ref >60)
Glucose, Bld: 118 mg/dL — ABNORMAL HIGH (ref 70–99)
Glucose, Bld: 128 mg/dL — ABNORMAL HIGH (ref 70–99)
Glucose, Urine, Semiquant: NEGATIVE
Glucose, Urine, Semiquant: NEGATIVE
HCT: 36 % (ref 36.0–46.0)
HCT: 40.6 % (ref 36.0–46.0)
HDL: 44.3 mg/dL (ref >39.00)
HDL: 48.6 mg/dL (ref >39.0)
Hemoglobin: 12.5 g/dL (ref 12.0–15.0)
Hemoglobin: 13.4 g/dL (ref 12.0–15.0)
Hgb A1c MFr Bld: 6 % (ref 4.6–6.0)
Hgb A1c MFr Bld: 6.1 % (ref 4.6–6.5)
Ketones, urine, test strip: NEGATIVE
Ketones, urine, test strip: NEGATIVE
Lymphocytes Relative: 37.9 % (ref 12.0–46.0)
Lymphocytes Relative: 38.7 % (ref 12.0–46.0)
Lymphs Abs: 1.7 10*3/uL (ref 0.7–4.0)
MCHC: 32.9 g/dL (ref 30.0–36.0)
MCHC: 34.8 g/dL (ref 30.0–36.0)
MCV: 89.7 fL (ref 78.0–100.0)
MCV: 90.5 fL (ref 78.0–100.0)
Microalb Creat Ratio: 0.5 mg/g (ref 0.0–30.0)
Microalb Creat Ratio: 8.7 mg/g (ref 0.0–30.0)
Microalb, Ur: 1.2 mg/dL (ref 0.0–1.9)
Microalb, Ur: 1.9 mg/dL (ref 0.0–1.9)
Monocytes Absolute: 0.4 10*3/uL (ref 0.1–1.0)
Monocytes Absolute: 0.5 10*3/uL (ref 0.1–1.0)
Monocytes Relative: 8.5 % (ref 3.0–12.0)
Monocytes Relative: 9.3 % (ref 3.0–12.0)
Neutro Abs: 2.2 10*3/uL (ref 1.4–7.7)
Neutro Abs: 2.5 10*3/uL (ref 1.4–7.7)
Neutrophils Relative %: 48.6 % (ref 43.0–77.0)
Neutrophils Relative %: 49.2 % (ref 43.0–77.0)
Nitrite: NEGATIVE
Nitrite: NEGATIVE
Pap Smear: NEGATIVE
Pap Smear: NORMAL
Platelets: 207 10*3/uL (ref 150.0–400.0)
Platelets: 242 10*3/uL (ref 150–400)
Potassium: 4 meq/L (ref 3.5–5.1)
Potassium: 4.5 meq/L (ref 3.5–5.1)
RBC: 3.98 M/uL (ref 3.87–5.11)
RBC: 4.53 M/uL (ref 3.87–5.11)
RDW: 12.6 % (ref 11.5–14.6)
RDW: 13.4 % (ref 11.5–14.6)
Sodium: 140 meq/L (ref 135–145)
Sodium: 143 meq/L (ref 135–145)
Specific Gravity, Urine: 1.02
Specific Gravity, Urine: 1.025
TSH: 2.04 microintl units/mL (ref 0.35–5.50)
TSH: 2.46 microintl units/mL (ref 0.35–5.50)
Total Bilirubin: 0.7 mg/dL (ref 0.3–1.2)
Total Bilirubin: 0.7 mg/dL (ref 0.3–1.2)
Total CHOL/HDL Ratio: 4.3
Total CHOL/HDL Ratio: 5
Total Protein: 7 g/dL (ref 6.0–8.3)
Total Protein: 7.2 g/dL (ref 6.0–8.3)
Triglycerides: 218 mg/dL (ref 0–149)
Triglycerides: 357 mg/dL — ABNORMAL HIGH (ref 0.0–149.0)
Urobilinogen, UA: 0.2
Urobilinogen, UA: 0.2
VLDL: 44 mg/dL — ABNORMAL HIGH (ref 0–40)
VLDL: 71.4 mg/dL — ABNORMAL HIGH (ref 0.0–40.0)
Vit D, 1,25-Dihydroxy: 26 — ABNORMAL LOW (ref 30–89)
Vit D, 25-Hydroxy: 51 ng/mL (ref 30–89)
WBC Urine, dipstick: NEGATIVE
WBC Urine, dipstick: NEGATIVE
WBC: 4.5 10*3/uL (ref 4.5–10.5)
WBC: 5 10*3/uL (ref 4.5–10.5)
pH: 5
pH: 7.5

## 2010-08-07 NOTE — Assessment & Plan Note (Signed)
Summary: patient fasting/mhf   Vital Signs:  Patient Profile:   77 Years Old Female Weight:      162 pounds Temp:     98 degrees F Pulse rate:   88 / minute BP sitting:   140 / 80  Vitals Entered By: g hudy rn                 Chief Complaint:  yrly ck up c/o knee pain back pain.  History of Present Illness: Pt in for yearly evaluation colonoscopic 11/27/06, diverticulosis, polyps CBGs 95-115 complains of pain left knee, has had knee replacement rt knee,also pain back after walking very much Low back pain occasional cough, non productive, [place] persist, 1 nexium per day to schedule mammogram and bone density cramps at nifht relieved with 1 tsp mustard EKG repeated and unchanged, had adenosine myoview without ischemia no other complaints     Current Allergies (reviewed today): No known allergies     Risk Factors:  Mammogram History:     Date of Last Mammogram:  12/07/2006    Results:  Abnormal Left   Colonoscopy History:     Date of Last Colonoscopy:  11/27/2006    Results:  Diverticulosis   PAP Smear History:     Date of Last PAP Smear:  10/03/2005    Results:  Normal    Review of Systems      See HPI  General      Denies chills, fatigue, fever, loss of appetite, malaise, sleep disorder, sweats, weakness, and weight loss.  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  CV      See HPI  Resp      Denies chest discomfort, chest pain with inspiration, cough, coughing up blood, excessive snoring, hypersomnolence, morning headaches, pleuritic, shortness of breath, sputum productive, and wheezing.  GI      See HPI  GU      Denies abnormal vaginal bleeding, decreased libido, discharge, dysuria, genital sores, hematuria, incontinence, nocturia, urinary  frequency, and urinary hesitancy.  MS      Denies joint pain, joint redness, joint swelling, loss of strength, low back pain, mid back pain, muscle aches, muscle , cramps, muscle weakness, stiffness, and thoracic pain.  Derm      Denies changes in color of skin, changes in nail beds, dryness, excessive perspiration, flushing, hair loss, insect bite(s), itching, lesion(s), poor wound healing, and rash.  Neuro      Denies brief paralysis, difficulty with concentration, disturbances in coordination, falling down, headaches, inability to speak, memory loss, numbness, poor balance, seizures, sensation of room spinning, tingling, tremors, visual disturbances, and weakness.  Psych      Denies alternate hallucination ( auditory/visual), anxiety, depression, easily angered, easily tearful, irritability, mental problems, panic attacks, sense of great danger, suicidal thoughts/plans, thoughts of violence, unusual visions or sounds, and thoughts /plans of harming others.  Endo      Denies cold intolerance, excessive hunger, excessive thirst, excessive urination, heat intolerance, polyuria, and weight change.  Heme      Denies abnormal bruising, bleeding, enlarge lymph nodes, fevers, pallor, and skin discoloration.  Allergy      Denies hives or rash, itching eyes, persistent infections, seasonal allergies, and sneezing.  ENT      Denies decreased hearing, difficulty  swallowing, ear discharge, earache, hoarseness, nasal congestion, nosebleeds, postnasal drainage, ringing in ears, sinus pressure, and sore throat.   Physical Exam  General:     Well-developed,well-nourished,in no acute distress; alert,appropriate and cooperative throughout examinationoverweight-appearing.   Head:     Normocephalic and atraumatic without obvious abnormalities. No apparent alopecia or balding. Eyes:     glasses otherwise neg Ears:     External ear exam shows no significant lesions or deformities.  Otoscopic  examination reveals clear canals, tympanic membranes are intact bilaterally without bulging, retraction, inflammation or discharge. Hearing is grossly normal bilaterally. Nose:     External nasal examination shows no deformity or inflammation. Nasal mucosa are pink and moist without lesions or exudates. Mouth:     Oral mucosa and oropharynx without lesions or exudates.  Teeth in good repair. Neck:     No deformities, masses, or tenderness noted. Chest Wall:     tenderness rt costochondral joints 3-5 Breasts:     No mass, nodules, thickening, tenderness, bulging, retraction, inflamation, nipple discharge or skin changes noted.   Lungs:     Normal respiratory effort, chest expands symmetrically. Lungs are clear to auscultation, no crackles or wheezes. Heart:     Normal rate and regular rhythm. S1 and S2 normal without gallop, murmur, click, rub or other extra sounds. Abdomen:     Bowel sounds positive,abdomen soft and non-tender without masses, organomegaly or hernias noted. Rectal:     NE Genitalia:     NE, pap next yr Msk:     tender left knee and left SI joint Pulses:     R and L carotid,radial,femoral,dorsalis pedis and posterior tibial pulses are full and equal bilaterally Extremities:     rt knee replace, left knee tender,left pretibial edema and right pretibial edema.   Neurologic:     No cranial nerve deficits noted. Station and gait are normal. Plantar reflexes are down-going bilaterally. DTRs are symmetrical throughout. Sensory, motor and coordinative functions appear intact. Skin:     Intact without suspicious lesions or rashes Cervical Nodes:     No lymphadenopathy noted Axillary Nodes:     No palpable lymphadenopathy Inguinal Nodes:     No significant adenopathy Psych:     Cognition and judgment appear intact. Alert and cooperative with normal attention span and concentration. No apparent delusions, illusions, hallucinations    Impression & Recommendations:   Problem # 1:  ARTHRITIS (ICD-716.90) Assessment: Deteriorated start diclofenac 75mg  two times a day   Problem # 2:  DM (ICD-250.00) Assessment: Improved  The following medications were removed from the medication list:    Glipizide 5 Mg Tabs (Glipizide) .Marland Kitchen... Take 1 tablet by mouth once a day  Her updated medication list for this problem includes:    Glipizide Xl 5 Mg Tb24 (Glipizide) ..... Once daily  Orders: Venipuncture [id number]) TLB-A1C / Hgb A1C (Glycohemoglobin) (83036-A1C) TLB-Microalbumin/Creat Ratio, Urine (82043-MALB)   Problem # 3:  COSTOCHONDRITIS (ICD-733.6) Assessment: New  Her updated medication list for this problem includes:    Vitamin D 50000 Unit Caps (Ergocalciferol) .Marland Kitchen... Take 1 by mouth weeklky for 8 weeks.   Problem # 4:  GERD (ICD-530.81) Assessment: Unchanged  Her updated medication list for this problem includes:    Nexium 40 Mg Cpdr (Esomeprazole magnesium) ..... Once daily   Complete Medication List: 1)  Onetouch Finepoint Lancets Misc (Lancets) .... Use 1 strip as directed 2)  Onetouch Ultra Test Strp (Glucose blood) 3)  Glipizide Xl 5 Mg Tb24 (Glipizide) .Marland KitchenMarland KitchenMarland Kitchen  Once daily 4)  Lipitor 10 Mg Tabs (Atorvastatin calcium) .... Once daily 5)  Nexium 40 Mg Cpdr (Esomeprazole magnesium) .... Once daily 6)  Estradiol 1 Mg Tabs (Estradiol) .... Once daily 7)  Calcium With Vit D  .... Two times a day 8)  Furosemide 40 Mg Tabs (Furosemide) .Marland Kitchen.. 1 each morning for edema 9)  Diclofenac Sodium 75 Mg Tbec (Diclofenac sodium) .Marland Kitchen.. 1 two times a day after meals for arthritis 10)  Vitamin D 50000 Unit Caps (Ergocalciferol) .... Take 1 by mouth weeklky for 8 weeks.  Other Orders: T- * Misc. Laboratory test [id number]) UA Dipstick w/o Micro (automated)  (81003) EKG w/ Interpretation (93000) TLB-Lipid Panel (80061-LIPID) TLB-BMP (Basic Metabolic Panel-BMET) (99991111) TLB-CBC Platelet - w/Differential (85025-CBCD) TLB-Hepatic/Liver Function Pnl  (80076-HEPATIC) TLB-TSH (Thyroid Stimulating Hormone) PB:[id number])  Colorectal Screening:  Colonoscopy Results:    Date of Exam: 11/27/2006    Results: Diverticulosis  Next Colonoscopy Due:    11/27/2011  PAP Screening:    Last PAP smear:  10/10/2005  PAP Smear Results:    Date of Exam:  10/03/2005    Results:  Normal  Mammogram Screening:    Last Mammogram:  12/07/2006  Mammogram Results:    Date of Exam:  12/07/2006    Results:  Abnormal Left  Mammogram Comments:    per pt was repeated  Next Mammogram Due:    12/07/2007  Osteoporosis Risk Assessment:  Risk Factors for Fracture or Low Bone Density:   Race (White or Asian):     yes   Thyroid disease:     yes  Immunization & Chemoprophylaxis:    Influenza vaccine: Fluvax MCR  (04/30/2007)    Pneumovax: Pneumovax  (05/09/1999)   Patient Instructions: 1)  EKG unchaned and no symptoms so no furtur tsting needed at this time 2)  start new medications as instructed    Prescriptions: VITAMIN D 16109 UNIT  CAPS (ERGOCALCIFEROL) take 1 by mouth weeklky for 8 weeks.  #8 x 0   Entered by:   Levora Angel, RN   Authorized by:   Emeterio Reeve MD   Signed by:   Levora Angel, RN on 10/19/2007   Method used:   Electronically sent to ...       [facility] [address] [place], [place]  [address]       Ph: [phone]       Fax: [phone]   RxID:   [id number] DICLOFENAC SODIUM 75 MG  TBEC (DICLOFENAC SODIUM) 1 two times a day after meals for arthritis  #60 x 11   Entered and Authorized by:   Emeterio Reeve MD   Signed by:   Emeterio Reeve MD on 10/08/2007   Method used:   Electronically sent to ...       [facility] [address] [place], [place]  [address]       Ph: [phone]       Fax: [phone]   RxID:   [id number] FUROSEMIDE 40 MG  TABS (FUROSEMIDE) 1 each morning for edema  #30 x 11   Entered and  Authorized by:   Emeterio Reeve MD   Signed by:   Emeterio Reeve MD on 10/08/2007   Method used:   Electronically sent to .[facility]  [facility] [address] [place],   [address]       Ph: [phone]       Fax: [phone]   RxID:   [id number] ESTRADIOL 1 MG  TABS (ESTRADIOL) once daily  #90 x 3   Entered and Authorized by:   Emeterio Reeve MD   Signed by:   Emeterio Reeve MD on 10/08/2007   Method used:   Print then Give to Patient   RxID:   [id number] NEXIUM 40 MG  CPDR (ESOMEPRAZOLE MAGNESIUM) once daily  #90 x 3   Entered and Authorized by:   Emeterio Reeve MD   Signed by:   Emeterio Reeve MD on 10/08/2007   Method used:   Print then Give to Patient   RxID:   [id number] LIPITOR 10 MG  TABS (ATORVASTATIN CALCIUM) once daily  #90 x 3   Entered and Authorized by:   Emeterio Reeve MD   Signed by:   Emeterio Reeve MD on 10/08/2007   Method used:   Print then Give to Patient   RxID:   [id number] GLIPIZIDE XL 5 MG  TB24 (GLIPIZIDE) once daily  #90 x 3   Entered and Authorized by:   Emeterio Reeve MD   Signed by:   Emeterio Reeve MD on 10/08/2007   Method used:   Print then Give to Patient   RxID:   [id number]  ] Laboratory Results   Urine Tests    Routine Urinalysis   Color: yellow Appearance: Clear Glucose: negative   (Normal Range: Negative) Bilirubin: negative   (Normal Range: Negative) Ketone: negative   (Normal Range: Negative) Spec. Gravity: 1.020   (Normal Range: 1.003-1.035) Blood: negative   (Normal Range: Negative) pH: 7.5   (Normal Range: 5.0-8.0) Protein: 1+   (Normal Range: Negative) Urobilinogen: 0.2   (Normal Range: 0-1) Nitrite: negative   (Normal Range: Negative) Leukocyte Esterace: negative   (Normal Range: Negative)    Comments: ...................................................................Milica Zimonjic  October 08, 2007 10:48 AM

## 2010-08-07 NOTE — Assessment & Plan Note (Signed)
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 Pt cries all the time also has new pain in left upper arm and neck. Had MRI  rt shoulder this week and to see Dr. Theda Sers next week diabetes doing fine  Allergies (verified): No Known Drug Allergies  Review of Systems General:  See HPI; Denies chills, fatigue, fever, loss of appetite, malaise, sleep disorder, sweats, weakness, and weight loss. CV:  Denies bluish discoloration of lips or nails, chest pain or discomfort, difficulty breathing at night, difficulty breathing while lying down, fainting, fatigue, leg cramps with exertion, lightheadness, near fainting, palpitations, shortness of breath with exertion, swelling of feet, swelling of hands, and weight gain. Resp:  Denies chest discomfort, chest pain with inspiration, cough, coughing up blood, excessive snoring, hypersomnolence, morning headaches, pleuritic, shortness of breath, sputum productive, and wheezing. GI:  Denies abdominal pain, bloody stools, change in bowel habits, constipation, dark tarry stools, diarrhea, excessive appetite, gas, hemorrhoids, indigestion, loss of appetite, nausea, vomiting, vomiting blood, and yellowish skin color. GU:  Denies abnormal vaginal bleeding, decreased libido, discharge, dysuria, genital sores, hematuria, incontinence, nocturia, urinary frequency, and urinary hesitancy. MS:  Complains of joint pain; rt shoulder painful on movement, neck painful and referred pain to left upper arm.  Physical  Exam  General:  Well-developed,well-nourished,in no acute distress; alert,appropriate and cooperative throughout examinationoverweight-appearing.   Lungs:  Normal respiratory effort, chest expands symmetrically. Lungs are clear to auscultation, no crackles or wheezes. Heart:  Normal rate and regular rhythm. S1 and S2 normal without gallop, murmur, click, rub or other extra sounds. Msk:  cervical tenderness to palpation pain on movement rt shoulder Pulses:  R and L carotid,radial,femoral,dorsalis pedis and posterior tibial pulses are full and equal bilaterally Extremities:  No clubbing, cyanosis, edema, or deformity noted with normal full range of motion of all joints.   Psych:  tense and depressed, cries easily   Impression & Recommendations:  Problem # 1:  SHOULDER PAIN, RIGHT (ICD-719.41) Assessment Deteriorated  Her updated medication list for this problem includes:    Adult Aspirin Ec Low Strength 81 Mg Tbec (Aspirin)    Methocarbamol 500 Mg Tabs (Methocarbamol) .Marland Kitchen... 1 by mouth three times a day.    Hydrocodone-acetaminophen 7.5-325 Mg Tabs (Hydrocodone-acetaminophen) .Marland Kitchen... 1 to 2 tabs every 4 to 6 hours for pain.    Percocet 10-650 Mg Tabs (Oxycodone-acetaminophen) .Marland Kitchen... 1 q4h as needed pain    Diclofenac Sodium 75 Mg Tbec (Diclofenac sodium) .Marland Kitchen... 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 Use 1 strip as directed 2)  Onetouch Ultra Test Strp (Glucose blood) 3)   Glipizide Xl 5 Mg Tb24 (Glipizide) .... Once daily 4)  Lipitor 10 Mg Tabs (Atorvastatin calcium) .... Once daily 5)  Nexium 40 Mg Cpdr (Esomeprazole magnesium) .... Once daily 6)  Estradiol 1 Mg Tabs (Estradiol) .... Once daily 7)  Calcium With Vit D  .... Two times a day 8)  Furosemide 40 Mg Tabs (Furosemide) .Marland Kitchen.. 1 each morning for edema 9)  Adult Aspirin Ec Low Strength 81 Mg Tbec (Aspirin) 10)  Methocarbamol 500 Mg Tabs (Methocarbamol) .Marland Kitchen.. 1 by mouth three times a day. 11)  Hydrocodone-acetaminophen 7.5-325 Mg Tabs (Hydrocodone-acetaminophen) .Marland Kitchen.. 1 to 2 tabs every 4 to 6 hours for pain. 12)  Percocet 10-650 Mg Tabs (Oxycodone-acetaminophen) .Marland Kitchen.. 1 q4h as needed pain 13)  Temazepam 30 Mg Caps (Temazepam) .Marland Kitchen.. 1 nhs for sleep 14)  Vitamin D (ergocalciferol) 50000 Unit Caps (Ergocalciferol) .Marland Kitchen.. 1 by mouth weekly 15)  Diclofenac Sodium 75 Mg Tbec (Diclofenac sodium) .Marland Kitchen.. 1 two times a day pc 16)  Lexapro 10 Mg Tabs (Escitalopram oxalate) .Marland Kitchen.. 1 two times a day for depressi0on  Patient Instructions: 1)  Depressed and cries easily over death of son 2)  to take Lexapro 10 mg each day 3)  Start Diclofenac  two times a day 4)   for neck and left arm, also may help rt shoulder 5)  Letter written for couts Prescriptions: VITAMIN D (ERGOCALCIFEROL) 50000 UNIT CAPS (ERGOCALCIFEROL) 1 by mouth weekly  #12 x 1   Entered by:   Levora Angel, RN   Authorized by:   Emeterio Reeve MD   Signed by:   Levora Angel, RN on 12/22/2008   Method used:   Electronically to        [facility] [id number] (retail)       [place], [place]  [address]       Ph: [phone]       Fax: [phone]   RxID:   [id number]

## 2010-08-07 NOTE — Letter (Signed)
Summary: Results Follow-up Letter  [facility]   [place], [place] [address]   Phone: [phone]  Fax: [phone]    10/22/2007  [place], [place]  [address]  Dear [name],   The following are the results of your recent test(s):   [name].   Sincerely,  gina , Armed forces operational officer at [employer]

## 2010-08-07 NOTE — Assessment & Plan Note (Signed)
Summary: surgical clearance/mhf   Vital Signs:  Patient Profile:   77 Years Old Female Weight:      158 pounds O2 Sat:      95 % Temp:     98.2 degrees F Pulse rate:   95 / minute BP sitting:   150 / 90  (left arm)  Vitals Entered By: Levora Angel, RN (June 07, 2008 5:16 PM)                 Chief Complaint:  surgical clearance and needs report for dr Maureen Ralphs.  History of Present Illness: Pt in for mewdical clearance  for knee replacement lef knee in January No other complaints CBG 90-100 ,under good control system review negative relates pain lateral aspect rt knee ,previously knee replacement, only taking 1 diclofenac once daily,instructed two times a day Pt had planned on earlier surgery but son was killed and surgery was postponed was previously cleared in July        Current Allergies: No known allergies      Review of Systems      See HPI  General      Denies chills, fatigue, fever, loss of appetite, malaise, sleep disorder, sweats, weakness, and weight loss.  Eyes      Denies blurring, discharge, double vision, eye irritation, eye pain, halos, itching, light sensitivity, red eye, vision loss-1 eye, and vision loss-both eyes.  ENT      Denies decreased hearing, difficulty swallowing, ear discharge, earache, hoarseness, nasal congestion, nosebleeds, postnasal drainage, ringing in ears, sinus pressure, and sore throat.  CV      Denies bluish discoloration of lips or nails, chest pain or discomfort, difficulty breathing at night, difficulty breathing while lying down, fainting, fatigue, leg cramps with exertion, lightheadness, near fainting, palpitations, shortness of breath with exertion, swelling of feet, swelling of hands, and weight gain.  Resp      Denies chest discomfort, chest pain with inspiration, cough, coughing up blood, excessive snoring, hypersomnolence, morning headaches, pleuritic, shortness of breath, sputum productive, and wheezing.   GI      Denies abdominal pain, bloody stools, change in bowel habits, constipation, dark tarry stools, diarrhea, excessive appetite, gas, hemorrhoids, indigestion, loss of appetite, nausea, vomiting, vomiting blood, and yellowish skin color.  GU      Denies abnormal vaginal bleeding, decreased libido, discharge, dysuria, genital sores, hematuria, incontinence, nocturia, urinary frequency, and urinary hesitancy.  MS      Denies joint pain, joint redness, joint swelling, loss of strength, low back pain, mid back pain, muscle aches, muscle , cramps, muscle weakness, stiffness, and thoracic pain.  Derm      Denies changes in color of skin, changes in nail beds, dryness, excessive perspiration, flushing, hair loss, insect bite(s), itching, lesion(s), poor wound healing, and rash.  Neuro      Denies brief paralysis, difficulty with concentration, disturbances in coordination, falling down, headaches, inability to speak, memory loss, numbness, poor balance, seizures, sensation of room spinning, tingling, tremors, visual disturbances, and weakness.  Psych      Denies alternate hallucination ( auditory/visual), anxiety, depression, easily angered, easily tearful, irritability, mental problems, panic attacks, sense of great danger, suicidal thoughts/plans, thoughts of violence, unusual visions or sounds, and thoughts /plans of harming others.  Endo      Denies cold intolerance, excessive hunger, excessive thirst, excessive urination, heat intolerance, polyuria, and weight change.  Heme      Denies abnormal bruising, bleeding, enlarge lymph nodes,  fevers, pallor, and skin discoloration.  Allergy      Denies hives or rash, itching eyes, persistent infections, seasonal allergies, and sneezing.   Physical Exam  General:     Well-developed,well-nourished,in no acute distress; alert,appropriate and cooperative throughout examinationoverweight-appearing.   Head:     Normocephalic and atraumatic  without obvious abnormalities. No apparent alopecia or balding. Eyes:     No corneal or conjunctival inflammation noted. EOMI. Perrla. Funduscopic exam benign, without hemorrhages, exudates or papilledema. Vision grossly normal. Ears:     External ear exam shows no significant lesions or deformities.  Otoscopic examination reveals clear canals, tympanic membranes are intact bilaterally without bulging, retraction, inflammation or discharge. Hearing is grossly normal bilaterally. Nose:     External nasal examination shows no deformity or inflammation. Nasal mucosa are pink and moist without lesions or exudates. Mouth:     Oral mucosa and oropharynx without lesions or exudates.  Teeth in good repair. Neck:     No deformities, masses, or tenderness noted. Chest Wall:     No deformities, masses, or tenderness noted. Breasts:     No mass, nodules, thickening, tenderness, bulging, retraction, inflamation, nipple discharge or skin changes noted.   Lungs:     Normal respiratory effort, chest expands symmetrically. Lungs are clear to auscultation, no crackles or wheezes. Heart:     Normal rate and regular rhythm. S1 and S2 normal without gallop, murmur, click, rub or other extra sounds. Abdomen:     Bowel sounds positive,abdomen soft and non-tender without masses, organomegaly or hernias noted. Rectal:     NE Genitalia:     NE Msk:     Rt knee replacement, tender lateral aspect Left knee swollen, tender medial aspect Pulses:     R and L carotid,radial,femoral,dorsalis pedis and posterior tibial pulses are full and equal bilaterally Extremities:     left pretibial edema and right pretibial edema.   Neurologic:     No cranial nerve deficits noted. Station and gait are normal. Plantar reflexes are down-going bilaterally. DTRs are symmetrical throughout. Sensory, motor and coordinative functions appear intact.    Impression & Recommendations:  Problem # 1:  UNSPECIFIED PRE-OPERATIVE  EXAMINATION (ICD-V72.84) Assessment: New  Problem # 2:  ARTHRITIS (ICD-716.90) Assessment: Unchanged  Complete Medication List: 1)  Onetouch Finepoint Lancets Misc (Lancets) .... Use 1 strip as directed 2)  Onetouch Ultra Test Strp (Glucose blood) 3)  Glipizide Xl 5 Mg Tb24 (Glipizide) .... Once daily 4)  Lipitor 10 Mg Tabs (Atorvastatin calcium) .... Once daily 5)  Nexium 40 Mg Cpdr (Esomeprazole magnesium) .... Once daily 6)  Estradiol 1 Mg Tabs (Estradiol) .... Once daily 7)  Calcium With Vit D  .... Two times a day 8)  Furosemide 40 Mg Tabs (Furosemide) .Marland Kitchen.. 1 each morning for edema 9)  Diclofenac Sodium 75 Mg Tbec (Diclofenac sodium) .Marland Kitchen.. 1 two times a day after meals for arthritis   Patient Instructions: 1)  Medically clear for Knee replacement  by Dr. Wynelle Link 2)  To have left knee replaced 3)  Sice lateral aspect of rt knee hurts atime, to increase diclofenac to two times a day 4)  If knee is painful after increasing dosage to change to abumetone 750 mg two times a day   ]

## 2010-08-07 NOTE — Progress Notes (Signed)
Summary: Needs new script  one touch ultra smart lancets and strips  Phone Note Call from Patient   Summary of Call: Pharmacy calling to say that they no longer have any record of patient's lancets and test strips. Patient needs a new script sent to the pharmacy. Pharmacy # [phone] [facility]) Initial call taken by: Marcelino Freestone RMA,  December 22, 2008 4:21 PM  Follow-up for Phone Call        pls call pt and find out from her what type of blood sugar machine she has that her pharmacy don't know and in order to order supplies we need to know which type machine she has  thanks  Additional Follow-up for Phone Call Additional follow up Details #1::        No answer. Additional Follow-up by: Marcelino Freestone RMA,  December 22, 2008 4:53 PM    Additional Follow-up for Phone Call Additional follow up Details #2::    One touch Ultra smart. Patient wants lancets and strips sent to [facility] [address] Follow-up by: Marcelino Freestone RMA,  December 22, 2008 4:55 PM  New/Updated Medications: ONETOUCH TEST  STRP (GLUCOSE BLOOD) test daily ONETOUCH LANCETS  MISC (LANCETS) test daily ONETOUCH FINEPOINT LANCETS  MISC (LANCETS)    Prescriptions: ONETOUCH LANCETS  MISC (LANCETS) test daily  #100 x 2   Entered by:   Levora Angel, RN   Authorized by:   Emeterio Reeve MD   Signed by:   Levora Angel, RN on 12/22/2008   Method used:   Electronically to        [id number] (retail)       [place], [place]  [address]       Ph: [phone]       Fax: [phone]   RxID:   [id number] [facility] TEST  STRP (GLUCOSE BLOOD) test daily  #100 x 2   Entered by:   Levora Angel, RN   Authorized by:   Emeterio Reeve MD   Signed by:   Levora Angel, RN on 12/22/2008   Method used:   Electronically to        [id number]* (retail)       [place],   [address]       Ph: [phone]       Fax: [phone]   RxID:    [id number]

## 2010-08-07 NOTE — Letter (Signed)
Summary: [facility] [facility] [employer] RE: Son's Estate  [place] [facility] [address] RE: Son's Estate   Imported By: Laural Benes 12/27/2008 15:23:42  _____________________________________________________________________  External Attachment:    Type:   Image     Comment:   External Document

## 2010-08-07 NOTE — Assessment & Plan Note (Signed)
Summary: flu shot/jls   Nurse Visit    Prior Medications: ONETOUCH FINEPOINT LANCETS  MISC (LANCETS) Use 1 strip as directed ONETOUCH ULTRA TEST  STRP (GLUCOSE BLOOD)  GLIPIZIDE XL 5 MG  TB24 (GLIPIZIDE) once daily LIPITOR 10 MG  TABS (ATORVASTATIN CALCIUM) once daily NEXIUM 40 MG  CPDR (ESOMEPRAZOLE MAGNESIUM) once daily ESTRADIOL 1 MG  TABS (ESTRADIOL) once daily CALCIUM WITH VIT D () two times a day FUROSEMIDE 40 MG  TABS (FUROSEMIDE) 1 each morning for edema DICLOFENAC SODIUM 75 MG  TBEC (DICLOFENAC SODIUM) 1 two times a day after meals for arthritis Current Allergies: No known allergies   Flu Vaccine Consent Questions     Do you have a history of severe allergic reactions to this vaccine? no    Any prior history of allergic reactions to egg and/or gelatin? no    Do you have a sensitivity to the preservative Thimersol? no    Do you have a past history of Guillan-Barre Syndrome? no    Do you currently have an acute febrile illness? no    Have you ever had a severe reaction to latex? no    Vaccine information given and explained to patient? yes    Are you currently pregnant? no    Lot Number:[id number]   Site Given  Left Deltoid IM   Orders Added: 1)  Admin 1st Vaccine [90471] 2)  Flu Vaccine 11yrs + [place].Seats    ]

## 2010-08-07 NOTE — Letter (Signed)
Summary: [name]   Imported By: Laural Benes 02/15/2009 10:21:44  _____________________________________________________________________  External Attachment:    Type:   Image     Comment:   External Document

## 2010-08-07 NOTE — Assessment & Plan Note (Signed)
Summary: flu shot//ccm   Nurse Visit   Review of Systems       Flu Vaccine Consent Questions     Do you have a history of severe allergic reactions to this vaccine? no    Any prior history of allergic reactions to egg and/or gelatin? no    Do you have a sensitivity to the preservative Thimersol? no    Do you have a past history of Guillan-Barre Syndrome? no    Do you currently have an acute febrile illness? no    Have you ever had a severe reaction to latex? no    Vaccine information given and explained to patient? yes    Are you currently pregnant? no    Lot Number:[id number]   Exp Date:01/04/2010   Site Given  Left Deltoid IM    Allergies: No Known Drug Allergies  Orders Added: 1)  Flu Vaccine 71yrs + [90658] 2)  Administration Flu vaccine - MCR [id number]

## 2010-08-07 NOTE — Assessment & Plan Note (Signed)
Summary: surgical clearance for knee replacement/mhf   Vital Signs:  Patient Profile:   77 Years Old Female Weight:      164 pounds Temp:     98.1 degrees F Pulse rate:   82 / minute BP sitting:   132 / 80  (left arm)  Vitals Entered By: Levora Angel, RN (January 26, 2008 4:14 PM)                 Chief Complaint:  needs clearance fro left knee surgery on sept  14 with dr Alphonzo Severance.  History of Present Illness: Pt in for evaluation regarding surgery Knee replacement ,left , on Sept 14 Has pain also  rt knee but left much worse CBGs 94-106 ROS neg otherwise     Current Allergies (reviewed today): No known allergies      Review of Systems      See HPI  General      Denies chills, fatigue, fever, loss of appetite, malaise, sleep disorder, sweats, weakness, and weight loss.  Eyes      Denies blurring, discharge, double vision, eye irritation, eye pain, halos, itching, light sensitivity, red eye, vision loss-1 eye, and vision loss-both eyes.  ENT      Denies decreased hearing, difficulty swallowing, ear discharge, earache, hoarseness, nasal congestion, nosebleeds, postnasal drainage, ringing in ears, sinus pressure, and sore throat.  CV      Denies bluish discoloration of lips or nails, chest pain or discomfort, difficulty breathing at night, difficulty breathing while lying down, fainting, fatigue, leg cramps with exertion, lightheadness, near fainting, palpitations, shortness of breath with exertion, swelling of feet, swelling of hands, and weight gain.  Resp      Denies chest discomfort, chest pain with inspiration, cough, coughing up blood, excessive snoring, hypersomnolence, morning headaches, pleuritic, shortness of breath, sputum productive, and wheezing.  GI      Denies abdominal pain, bloody stools, change in bowel habits, constipation, dark tarry stools, diarrhea, excessive appetite, gas, hemorrhoids, indigestion, loss of appetite, nausea, vomiting, vomiting  blood, and yellowish skin color.  MS      See HPI  Derm      Denies changes in color of skin, changes in nail beds, dryness, excessive perspiration, flushing, hair loss, insect bite(s), itching, lesion(s), poor wound healing, and rash.  Neuro      Denies brief paralysis, difficulty with concentration, disturbances in coordination, falling down, headaches, inability to speak, memory loss, numbness, poor balance, seizures, sensation of room spinning, tingling, tremors, visual disturbances, and weakness.  Psych      Denies alternate hallucination ( auditory/visual), anxiety, depression, easily angered, easily tearful, irritability, mental problems, panic attacks, sense of great danger, suicidal thoughts/plans, thoughts of violence, unusual visions or sounds, and thoughts /plans of harming others.   Physical Exam  General:     Well-developed,well-nourished,in no acute distress; alert,appropriate and cooperative throughout examination Head:     Normocephalic and atraumatic without obvious abnormalities. No apparent alopecia or balding. Eyes:     No corneal or conjunctival inflammation noted. EOMI. Perrla. Funduscopic exam benign, without hemorrhages, exudates or papilledema. Vision grossly normal. Ears:     External ear exam shows no significant lesions or deformities.  Otoscopic examination reveals clear canals, tympanic membranes are intact bilaterally without bulging, retraction, inflammation or discharge. Hearing is grossly normal bilaterally. Nose:     External nasal examination shows no deformity or inflammation. Nasal mucosa are pink and moist without lesions or exudates. Mouth:  Oral mucosa and oropharynx without lesions or exudates.  Teeth in good repair. Neck:     No deformities, masses, or tenderness noted. Chest Wall:     No deformities, masses, or tenderness noted. Breasts:     No mass, nodules, thickening, tenderness, bulging, retraction, inflamation, nipple discharge or  skin changes noted.   Lungs:     Normal respiratory effort, chest expands symmetrically. Lungs are clear to auscultation, no crackles or wheezes. Heart:     Normal rate and regular rhythm. S1 and S2 normal without gallop, murmur, click, rub or other extra sounds. Abdomen:     Bowel sounds positive,abdomen soft and non-tender without masses, organomegaly or hernias noted. Rectal:     NE  Genitalia:     NE Msk:     Pain and tenderness both knees , left greater than rt Pulses:     R and L carotid,radial,femoral,dorsalis pedis and posterior tibial pulses are full and equal bilaterally Extremities:     No clubbing, cyanosis, edema, or deformity noted with normal full range of motion of all joints.   Neurologic:     No cranial nerve deficits noted. Station and gait are normal. Plantar reflexes are down-going bilaterally. DTRs are symmetrical throughout. Sensory, motor and coordinative functions appear intact.    Impression & Recommendations:  Problem # 1:  ARTHRITIS (ICD-716.90) Assessment: Deteriorated  Problem # 2:  OSTEOPENIA (ICD-733.90) Assessment: Unchanged  The following medications were removed from the medication list:    Vitamin D 50000 Unit Caps (Ergocalciferol) .Marland Kitchen... Take 1 by mouth weeklky for 8 weeks.   Problem # 3:  GERD (ICD-530.81) Assessment: Improved  Problem # 4:  DM (ICD-250.00) Assessment: Improved  Her updated medication list for this problem includes:    Glipizide Xl 5 Mg Tb24 (Glipizide) ..... Once daily  Orders: Venipuncture IM:[id number]) TLB-A1C / Hgb A1C (Glycohemoglobin) (83036-A1C)   Complete Medication List: 1)  Onetouch Finepoint Lancets Misc (Lancets) .... Use 1 strip as directed 2)  Onetouch Ultra Test Strp (Glucose blood) 3)  Glipizide Xl 5 Mg Tb24 (Glipizide) .... Once daily 4)  Lipitor 10 Mg Tabs (Atorvastatin calcium) .... Once daily 5)  Nexium 40 Mg Cpdr (Esomeprazole magnesium) .... Once daily 6)  Estradiol 1 Mg Tabs (Estradiol) ....  Once daily 7)  Calcium With Vit D  .... Two times a day 8)  Furosemide 40 Mg Tabs (Furosemide) .Marland Kitchen.. 1 each morning for edema 9)  Diclofenac Sodium 75 Mg Tbec (Diclofenac sodium) .Marland Kitchen.. 1 two times a day after meals for arthritis   Patient Instructions: 1)  satisfactory for surgery   ]

## 2010-08-07 NOTE — Letter (Signed)
Summary: Results Follow-up Letter  [facility]   [place], [place] [address]   Phone: [phone]  Fax: [phone]    12/25/2009  [place], [place]  [address]  Dear Ms. Vorhees,   The following are the results of your recent test(s):     Sincerely,       Dr Viona Gilmore.R.Stafford  at [facility]: Results Follow-up Letter negative pt aware.

## 2010-08-07 NOTE — Assessment & Plan Note (Signed)
Summary: FLU SHOT/CCM  Nurse Visit    Prior Medications: GLIPIZIDE 5 MG TABS (GLIPIZIDE) Take 1 tablet by mouth once a day ONETOUCH FINEPOINT LANCETS  MISC (LANCETS) Use 1 strip as directed ONETOUCH ULTRA TEST  STRP (GLUCOSE BLOOD)     Influenza Vaccine    Vaccine Type: Fluvax MCR    Given by: Townsend Roger, CMA  Flu Vaccine Consent Questions    Do you have a history of severe allergic reactions to this vaccine? no    Any prior history of allergic reactions to egg and/or gelatin? no    Do you have a sensitivity to the preservative Thimersol? no    Do you have a past history of Guillan-Barre Syndrome? no    Do you currently have an acute febrile illness? no    Have you ever had a severe reaction to latex? no    Vaccine information given and explained to patient? yes    Are you currently pregnant? no   Impression & Recommendations:  lot [id number], EXP 30 jun 09, [employer]  right deltoid IM, 0.5 cc.  Complete Medication List: 1)  Glipizide 5 Mg Tabs (Glipizide) .... Take 1 tablet by mouth once a day 2)  Onetouch Finepoint Lancets Misc (Lancets) .... Use 1 strip as directed 3)  Onetouch Ultra Test Strp (Glucose blood)   Orders Added: 1)  Influenza Vaccine MCR [00025]    ]

## 2010-08-07 NOTE — Assessment & Plan Note (Signed)
Summary: pneumonia shot/mhf   Nurse Visit   Vital Signs:  Patient Profile:   77 Years Old Female O2 Sat:      95 % Temp:     97.6 degrees F Pulse rate:   89 / minute  Vitals Entered By: Levora Angel, RN (May 18, 2008 11:27 AM)                 Prior Medications: ONETOUCH FINEPOINT LANCETS  MISC (LANCETS) Use 1 strip as directed ONETOUCH ULTRA TEST  STRP (GLUCOSE BLOOD)  GLIPIZIDE XL 5 MG  TB24 (GLIPIZIDE) once daily LIPITOR 10 MG  TABS (ATORVASTATIN CALCIUM) once daily NEXIUM 40 MG  CPDR (ESOMEPRAZOLE MAGNESIUM) once daily ESTRADIOL 1 MG  TABS (ESTRADIOL) once daily CALCIUM WITH VIT D () two times a day FUROSEMIDE 40 MG  TABS (FUROSEMIDE) 1 each morning for edema DICLOFENAC SODIUM 75 MG  TBEC (DICLOFENAC SODIUM) 1 two times a day after meals for arthritis Current Allergies: No known allergies    Pneumovax Vaccine    Vaccine Type: Pneumovax    Site: left deltoid    Mfr: Merck    Dose: 0.5 ml    Route: IM    Given by: Levora Angel, RN    Exp. Date: 06/09/3009    Lot #: [id number]   Orders Added: 1)  Pneumococcal Vaccine [id number] 2)  Admin 1st Vaccine Joey.[email or web address]    ]

## 2010-08-07 NOTE — Assessment & Plan Note (Signed)
Summary: arm and shoulder pain/mhf   Vital Signs:  Patient Profile:   77 Years Old Female Weight:      159 pounds O2 Sat:      95 % Temp:     98.0 degrees F Pulse rate:   82 / minute BP sitting:   130 / 70  (left arm) Cuff size:   regular  Vitals Entered By: Levora Angel, RN (August 23, 2008 9:59 AM)                 Chief Complaint:  Sweeling in left leg. Right arm and shoulder pain. Marland Kitchen  History of Present Illness: Pt had left knee replaced Jan 4th, doing OK until past 2-3 weks and has edema lower lrg, some pain anteriorily, has not taken furosemide Having pain in rt shoulder at rest and also on movement, unable to elevate arm no other complaints    Current Allergies: No known allergies      Review of Systems      See HPI  General      Denies chills, fatigue, fever, loss of appetite, malaise, sleep disorder, sweats, weakness, and weight loss.  ENT      Denies decreased hearing, difficulty swallowing, ear discharge, earache, hoarseness, nasal congestion, nosebleeds, postnasal drainage, ringing in ears, sinus pressure, and sore throat.  CV      Denies bluish discoloration of lips or nails, chest pain or discomfort, difficulty breathing at night, difficulty breathing while lying down, fainting, fatigue, leg cramps with exertion, lightheadness, near fainting, palpitations, shortness of breath with exertion, swelling of feet, swelling of hands, and weight gain.  Resp      Denies chest discomfort, chest pain with inspiration, cough, coughing up blood, excessive snoring, hypersomnolence, morning headaches, pleuritic, shortness of breath, sputum productive, and wheezing.  GI      Denies abdominal pain, bloody stools, change in bowel habits, constipation, dark tarry stools, diarrhea, excessive appetite, gas, hemorrhoids, indigestion, loss of appetite, nausea, vomiting, vomiting blood, and yellowish skin color.  GU      Denies abnormal vaginal bleeding, decreased  libido, discharge, dysuria, genital sores, hematuria, incontinence, nocturia, urinary frequency, and urinary hesitancy.  MS      See HPI      Complains of joint pain.      edema left lower leg   Physical Exam  General:     Well-developed,well-nourished,in no acute distress; alert,appropriate and cooperative throughout examination Lungs:     Normal respiratory effort, chest expands symmetrically. Lungs are clear to auscultation, no crackles or wheezes. Heart:     Normal rate and regular rhythm. S1 and S2 normal without gallop, murmur, click, rub or other extra sounds. Msk:     left knee replacement and 2 plus edema lower leg, tender over tibia but no calf tenderness rt shoulder, tenderness over subdeltoid bursa, pain on movement arm whn attemptint raise ARM Extremities:     left pretibial edema.      Impression & Recommendations:  Problem # 1:  TOTAL KNEE REPLACEMENT, LEFT, HX OF (ICD-V43.65) Assessment: New  Problem # 2:  BURSITIS (T8460880.3) Assessment: New  Orders: T-Shoulder Right (73030TC) Joint Aspirate / Injection, Large (20610) Depo- Medrol 40mg  (J1030) Depo- Medrol 80mg  (J1040)   Problem # 3:  EDEMA (ICD-782.3) Assessment: Deteriorated  Her updated medication list for this problem includes:    Furosemide 40 Mg Tabs (Furosemide) .Marland Kitchen... 1 each morning for edema   Problem # 4:  DM (ICD-250.00) Assessment: Improved  Her updated medication list for this problem includes:    Glipizide Xl 5 Mg Tb24 (Glipizide) ..... Once daily    Adult Aspirin Ec Low Strength 81 Mg Tbec (Aspirin)   Complete Medication List: 1)  Onetouch Finepoint Lancets Misc (Lancets) .... Use 1 strip as directed 2)  Onetouch Ultra Test Strp (Glucose blood) 3)  Glipizide Xl 5 Mg Tb24 (Glipizide) .... Once daily 4)  Lipitor 10 Mg Tabs (Atorvastatin calcium) .... Once daily 5)  Nexium 40 Mg Cpdr (Esomeprazole magnesium) .... Once daily 6)  Estradiol 1 Mg Tabs (Estradiol) .... Once daily 7)   Calcium With Vit D  .... Two times a day 8)  Furosemide 40 Mg Tabs (Furosemide) .Marland Kitchen.. 1 each morning for edema 9)  Diclofenac Sodium 75 Mg Tbec (Diclofenac sodium) .Marland Kitchen.. 1 two times a day after meals for arthritis 10)  Adult Aspirin Ec Low Strength 81 Mg Tbec (Aspirin) 11)  Lyrica 75 Mg Caps (Pregabalin) .Marland Kitchen.. 1 by mouth two times a day. 12)  Methocarbamol 500 Mg Tabs (Methocarbamol) .Marland Kitchen.. 1 by mouth three times a day. 13)  Hydrocodone-acetaminophen 7.5-325 Mg Tabs (Hydrocodone-acetaminophen) .Marland Kitchen.. 1 to 2 tabs every 4 to 6 hours for pain. 14)  Sterapred Ds 12 Day 10 Mg Tabs (Prednisone) .... As directed per pkge 15)  Percocet 10-650 Mg Tabs (Oxycodone-acetaminophen) .Marland Kitchen.. 1 q4h as needed pain  Other Orders: Venipuncture [id number]) Doppler Referral (Doppler) TLB-TSH (Thyroid Stimulating Hormone) (84443-TSH) TLB-T3 Uptake (84479-T3UP) TLB-T4 (Thyrox), Free [phone]) TLB-T4 (Thyrox), Total (84436-T4) TLB-T3, Free (Triiodothyronine) (84481-T3FREE) Hgb [id number])   Patient Instructions: 1)  Since knee surgery has now started having edema left leg ,to get doppler leg 2)  start diclofenac 75 mg  two times a day 3)  start furosemide 40 mg each day 4)  subdeltoid bursitis rt shoulder, injedted with depomedrol 5)  cold pack 20 mintes, wait 5 minutes and apply heat 20-30 minutes 6)  cont lyrica   Prescriptions: PERCOCET 10-650 MG TABS (OXYCODONE-ACETAMINOPHEN) 1 q4h as needed pain  #100 x 0   Entered and Authorized by:   Emeterio Reeve MD   Signed by:   Emeterio Reeve MD on 09/01/2008   Method used:   Print then Give to Patient   RxID:   [id number] STERAPRED DS 12 DAY 10 MG TABS (PREDNISONE) as directed per pkge  #1 x 1   Entered and Authorized by:   Emeterio Reeve MD   Signed by:   Emeterio Reeve MD on 09/01/2008   Method used:   Electronically to        [id number] (retail)       [place], [place]  [address]       Ph:  [phone]       Fax: [phone]   RxID:   [id number] METHOCARBAMOL 500 MG TABS (METHOCARBAMOL) 1 by mouth three times a day.  #90 x 11   Entered and Authorized by:   Emeterio Reeve MD   Signed by:   Emeterio Reeve MD on 09/01/2008   Method used:   Electronically to        [facility]. [id number] (retail)       [place], [place]  [address]       Ph: [phone]       Fax: [phone]   RxID:  [id number]  

## 2010-08-07 NOTE — Letter (Signed)
Summary: Request for Surgical Clearance/[facility]  Request for Surgical Clearance/[facility]   Imported By: Laural Benes 03/09/2008 13:04:12  _____________________________________________________________________  External Attachment:    Type:   Image     Comment:   External Document

## 2010-08-07 NOTE — Letter (Signed)
Summary: Results Follow-up Letter  [facility]   [place], [place] [address]   Phone: [phone]  Fax: [phone]    10/15/2007  [place], [place]  [address]  Dear Ms. Kawecki,   The following are the results of your recent test(s):  Test     Result     Pap Smear    Normal____yes  ____    Sincerely,  gina , RN  at [facility]

## 2010-08-07 NOTE — Consult Note (Signed)
Summary: Dr Herbert Deaner note  Dr Herbert Deaner note   Imported By: Jamelle Haring 11/06/2007 08:39:16  _____________________________________________________________________  External Attachment:    Type:   Image     Comment:   Dr Herbert Deaner note

## 2010-08-07 NOTE — Assessment & Plan Note (Signed)
Summary: shoulder still hurts,stay cold alltime/jls   Vital Signs:  Patient Profile:   77 Years Old Female Weight:      151 pounds O2 Sat:      96 % Temp:     97.5 degrees F Pulse rate:   94 / minute BP sitting:   140 / 70  (left arm) Cuff size:   regular  Vitals Entered By: Levora Angel, RN (September 01, 2008 11:10 AM)                 Chief Complaint:  shoulder/arm pain. stays cold all the time.Marland Kitchen  History of Present Illness: Pt continues having rt shoulder pain despite in jection on 08/23/08 slight improvement pt complains of being cold all the time, recent TSH normal , to check Hgb Knee replacement is improving, continues to have therapy CBGS 100-110 gerd under control    Current Allergies: No known allergies      Review of Systems      See HPI  General      Denies chills, fatigue, fever, loss of appetite, malaise, sleep disorder, sweats, weakness, and weight loss.  CV      Denies bluish discoloration of lips or nails, chest pain or discomfort, difficulty breathing at night, difficulty breathing while lying down, fainting, fatigue, leg cramps with exertion, lightheadness, near fainting, palpitations, shortness of breath with exertion, swelling of feet, swelling of hands, and weight gain.  Resp      Denies chest discomfort, chest pain with inspiration, cough, coughing up blood, excessive snoring, hypersomnolence, morning headaches, pleuritic, shortness of breath, sputum productive, and wheezing.  GI      Denies abdominal pain, bloody stools, change in bowel habits, constipation, dark tarry stools, diarrhea, excessive appetite, gas, hemorrhoids, indigestion, loss of appetite, nausea, vomiting, vomiting blood, and yellowish skin color.  GU      Denies abnormal vaginal bleeding, decreased libido, discharge, dysuria, genital sores, hematuria, incontinence, nocturia, urinary frequency, and urinary hesitancy.  MS      See HPI      Complains of joint pain.  left knee replacement , edema improved rt shoulder continues to be very painful   Physical Exam  General:     Well-developed,well-nourished,in no acute distress; alert,appropriate and cooperative throughout examination Lungs:     Normal respiratory effort, chest expands symmetrically. Lungs are clear to auscultation, no crackles or wheezes. Heart:     Normal rate and regular rhythm. S1 and S2 normal without gallop, murmur, click, rub or other extra sounds. Msk:     edema left leg only trace rt shoulder continues to have kimited movement and painful Extremities:     only trace edema left leg    Impression & Recommendations:  Problem # 1:  EDEMA (ICD-782.3) Assessment: Improved  Her updated medication list for this problem includes:    Furosemide 40 Mg Tabs (Furosemide) .Marland Kitchen... 1 each morning for edema   Problem # 2:  BURSITIS ([id number]) Assessment: Unchanged  Complete Medication List: 1)  Onetouch Finepoint Lancets Misc (Lancets) .... Use 1 strip as directed 2)  Onetouch Ultra Test Strp (Glucose blood) 3)  Glipizide Xl 5 Mg Tb24 (Glipizide) .... Once daily 4)  Lipitor 10 Mg Tabs (Atorvastatin calcium) .... Once daily 5)  Nexium 40 Mg Cpdr (Esomeprazole magnesium) .... Once daily 6)  Estradiol 1 Mg Tabs (Estradiol) .... Once daily 7)  Calcium With Vit D  .... Two times a day 8)  Furosemide 40 Mg Tabs (Furosemide) .Marland KitchenMarland KitchenMarland Kitchen 1  each morning for edema 9)  Diclofenac Sodium 75 Mg Tbec (Diclofenac sodium) .Marland Kitchen.. 1 two times a day after meals for arthritis 10)  Adult Aspirin Ec Low Strength 81 Mg Tbec (Aspirin) 11)  Lyrica 75 Mg Caps (Pregabalin) .Marland Kitchen.. 1 by mouth two times a day. 12)  Methocarbamol 500 Mg Tabs (Methocarbamol) .Marland Kitchen.. 1 by mouth three times a day. 13)  Hydrocodone-acetaminophen 7.5-325 Mg Tabs (Hydrocodone-acetaminophen) .Marland Kitchen.. 1 to 2 tabs every 4 to 6 hours for pain. 14)  Sterapred Ds 12 Day 10 Mg Tabs (Prednisone) .... As directed per pkge 15)  Percocet 10-650 Mg Tabs  (Oxycodone-acetaminophen) .Marland Kitchen.. 1 q4h as needed pain  Other Orders: Hgb [id number]) Fingerstick [id number])   Patient Instructions: 1)  Hgb 12.6 good 2)  To tx bursitis with sterapred dosepke 3)  increase vicodin for pain 4)  to Xray rt shoulder    Laboratory Results   Blood Tests    Date/Time Reported: September 01, 2008 12:09 PM    CBC HGB:  12.6 g/dL   (Normal Range: 13.0-17.0 in Males, 12.0-15.0 in Females) Comments: Doy Hutching, CMA  September 01, 2008 12:09 PM

## 2010-08-07 NOTE — Assessment & Plan Note (Signed)
Summary: FLU SHOT/PT SPOUSE HAS APPT AT 10:15AM/[facility]   Nurse Visit   Allergies: No Known Drug Allergies  Review of Systems       Flu Vaccine Consent Questions     Do you have a history of severe allergic reactions to this vaccine? no    Any prior history of allergic reactions to egg and/or gelatin? no    Do you have a sensitivity to the preservative Thimersol? no    Do you have a past history of Guillan-Barre Syndrome? no    Do you currently have an acute febrile illness? no    Have you ever had a severe reaction to latex? no    Vaccine information given and explained to patient? yes    Are you currently pregnant? no    Lot Number:[id number]   Exp Date:01/05/2011   Site Given  Left Deltoid IM Gardenia Phlegm RMA  April 24, 2010 10:11 AM    Orders Added: 1)  Flu Vaccine 15yrs + MEDICARE PATIENTS [Q2039] 2)  Administration Flu vaccine - MCR [id number]

## 2010-08-07 NOTE — Assessment & Plan Note (Signed)
Summary: emp patient fasting/mhf   Vital Signs:  Patient profile:   77 year old female Height:      61 inches Weight:      150 pounds BMI:     28.44 O2 Sat:      96 % Temp:     98.2 degrees F Pulse rate:   102 / minute BP sitting:   140 / 80  (left arm)  Vitals Entered By: Levora Angel, RN (November 02, 2008 9:17 AM)  Contraindications/Deferment of Procedures/Staging:    Test/Procedure: TD vaccine    Reason for deferment: declined   History of Present Illness: Pt in to discuss problems with shoulders and knees , as well as diabetes which is under good control a6t present, cbgs 105-110 geneerALLY nEEDS 90 DAY REFILLS MEDICATIONS pAP09, BONE DENSITY 09, EYE EXAM dR. [place] DUE 2013 mammogram 2009 neg, to schedule Shoulder much improved, tx Dr. Virgina Jock replacements doing well, continues to need analgesics at times Problem insomnia  Allergies (verified): No Known Drug Allergies  Review of Systems      See HPI  The patient denies anorexia, fever, weight loss, weight gain, vision loss, decreased hearing, hoarseness, chest pain, syncope, dyspnea on exertion, peripheral edema, prolonged cough, headaches, hemoptysis, abdominal pain, melena, hematochezia, severe indigestion/heartburn, hematuria, incontinence, genital sores, muscle weakness, suspicious skin lesions, transient blindness, difficulty walking, depression, unusual weight change, abnormal bleeding, enlarged lymph nodes, angioedema, breast masses, and testicular masses.   General:  See HPI; Denies chills, fatigue, fever, loss of appetite, malaise, sleep disorder, sweats, weakness, and weight loss. Eyes:  Denies blurring, discharge, double vision, eye irritation, eye pain, halos, itching, light sensitivity, red eye, vision loss-1 eye, and vision loss-both eyes; glasses long term. ENT:  Denies decreased hearing, difficulty swallowing, ear discharge, earache, hoarseness, nasal congestion, nosebleeds,  postnasal drainage, ringing in ears, sinus pressure, and sore throat. CV:  Denies bluish discoloration of lips or nails, chest pain or discomfort, difficulty breathing at night, difficulty breathing while lying down, fainting, fatigue, leg cramps with exertion, lightheadness, near fainting, palpitations, shortness of breath with exertion, swelling of feet, swelling of hands, and weight gain. Resp:  Denies chest discomfort, chest pain with inspiration, cough, coughing up blood, excessive snoring, hypersomnolence, morning headaches, pleuritic, shortness of breath, sputum productive, and wheezing. GI:  Complains of indigestion; denies abdominal pain, bloody stools, change in bowel habits, constipation, dark tarry stools, diarrhea, excessive appetite, gas, hemorrhoids, loss of appetite, nausea, vomiting, vomiting blood, and yellowish skin color; gerd controlled with Nexium. GU:  Denies abnormal vaginal bleeding, decreased libido, discharge, dysuria, genital sores, hematuria, incontinence, nocturia, urinary frequency, and urinary hesitancy. MS:  See HPI. Derm:  Denies changes in color of skin, changes in nail beds, dryness, excessive perspiration, flushing, hair loss, insect bite(s), itching, lesion(s), poor wound healing, and rash. Neuro:  Denies brief paralysis, difficulty with concentration, disturbances in coordination, falling down, headaches, inability to speak, memory loss, numbness, poor balance, seizures, sensation of room spinning, tingling, tremors, visual disturbances, and weakness. Psych:  Denies alternate hallucination ( auditory/visual), anxiety, depression, easily angered, easily tearful, irritability, mental problems, panic attacks, sense of great danger, suicidal thoughts/plans, thoughts of violence, unusual visions or sounds, and thoughts /plans of harming others. Endo:  Denies cold intolerance, excessive hunger, excessive thirst, excessive urination, heat intolerance, polyuria, and weight  change. Heme:  Denies abnormal bruising, bleeding, enlarge lymph nodes, fevers, pallor, and skin discoloration. Allergy:  Denies hives or rash, itching eyes, persistent infections, seasonal allergies,  and sneezing.  Physical Exam  General:  Well-developed,well-nourished,in no acute distress; alert,appropriate and cooperative throughout examination Head:  Normocephalic and atraumatic without obvious abnormalities. No apparent alopecia or balding. Eyes:  No corneal or conjunctival inflammation noted. EOMI. Perrla. Funduscopic exam benign, without hemorrhages, exudates or papilledema. Vision grossly normal. Ears:  External ear exam shows no significant lesions or deformities.  Otoscopic examination reveals clear canals, tympanic membranes are intact bilaterally without bulging, retraction, inflammation or discharge. Hearing is grossly normal bilaterally. Nose:  External nasal examination shows no deformity or inflammation. Nasal mucosa are pink and moist without lesions or exudates. Mouth:  Oral mucosa and oropharynx without lesions or exudates.  Teeth in good repair. Neck:  No deformities, masses, or tenderness noted. Chest Wall:  No deformities, masses, or tenderness noted. Breasts:  No mass, nodules, thickening, tenderness, bulging, retraction, inflamation, nipple discharge or skin changes noted.   Lungs:  Normal respiratory effort, chest expands symmetrically. Lungs are clear to auscultation, no crackles or wheezes. Heart:  Normal rate and regular rhythm. S1 and S2 normal without gallop, murmur, click, rub or other extra sounds. Abdomen:  Bowel sounds positive,abdomen soft and non-tender without masses, organomegaly or hernias noted. Rectal:  NE Genitalia:  NE Msk:  No deformity or scoliosis noted of thoracic or lumbar spine.  , knee replacements, shouder much improved with movement, cont therpy left knee painful and tender, hardly past 90 degrees Pulses:  R and L  carotid,radial,femoral,dorsalis pedis and posterior tibial pulses are full and equal bilaterally Extremities:  No clubbing, cyanosis, edema, or deformity noted with normal full range of motion of all joints.   Neurologic:  No cranial nerve deficits noted. Station and gait are normal. Plantar reflexes are down-going bilaterally. DTRs are symmetrical throughout. Sensory, motor and coordinative functions appear intact. Skin:  Intact without suspicious lesions or rashes Cervical Nodes:  No lymphadenopathy noted Axillary Nodes:  No palpable lymphadenopathy Inguinal Nodes:  No significant adenopathy Psych:  Cognition and judgment appear intact. Alert and cooperative with normal attention span and concentration. No apparent delusions, illusions, hallucinations   Impression & Recommendations:  Problem # 1:  TOTAL KNEE REPLACEMENT, LEFT, HX OF (ICD-V43.65) Assessment Improved  Problem # 2:  BURSITIS (T8460880.3) Assessment: Improved  Problem # 3:  EDEMA (ICD-782.3) Assessment: Improved  Her updated medication list for this problem includes:    Furosemide 40 Mg Tabs (Furosemide) .Marland Kitchen... 1 each morning for edema  Problem # 4:  ARTHRITIS (ICD-716.90) Assessment: Improved  Problem # 5:  HYPERLIPIDEMIA (ICD-272.4) Assessment: Improved  Her updated medication list for this problem includes:    Lipitor 10 Mg Tabs (Atorvastatin calcium) ..... Once daily  Orders: TLB-Lipid Panel (80061-LIPID) TLB-Hepatic/Liver Function Pnl (80076-HEPATIC)  Problem # 6:  GERD (ICD-530.81) Assessment: Improved  Her updated medication list for this problem includes:    Nexium 40 Mg Cpdr (Esomeprazole magnesium) ..... Once daily  Orders: Prescription Created Electronically [phone])  Complete Medication List: 1)  Onetouch Finepoint Lancets Misc (Lancets) .... Use 1 strip as directed 2)  Onetouch Ultra Test Strp (Glucose blood) 3)  Glipizide Xl 5 Mg Tb24 (Glipizide) .... Once daily 4)  Lipitor 10 Mg Tabs  (Atorvastatin calcium) .... Once daily 5)  Nexium 40 Mg Cpdr (Esomeprazole magnesium) .... Once daily 6)  Estradiol 1 Mg Tabs (Estradiol) .... Once daily 7)  Calcium With Vit D  .... Two times a day 8)  Furosemide 40 Mg Tabs (Furosemide) .Marland Kitchen.. 1 each morning for edema 9)  Adult Aspirin Ec Low Strength  81 Mg Tbec (Aspirin) 10)  Methocarbamol 500 Mg Tabs (Methocarbamol) .Marland Kitchen.. 1 by mouth three times a day. 11)  Hydrocodone-acetaminophen 7.5-325 Mg Tabs (Hydrocodone-acetaminophen) .Marland Kitchen.. 1 to 2 tabs every 4 to 6 hours for pain. 12)  Percocet 10-650 Mg Tabs (Oxycodone-acetaminophen) .Marland Kitchen.. 1 q4h as needed pain 13)  Temazepam 30 Mg Caps (Temazepam) .Marland Kitchen.. 1 nhs for sleep 14)  Freestyle Control Solution Liqd (Blood glucose calibration) 15)  Freestyle Lite Test Strp (Glucose blood) .... Check bs 3x week 16)  Freestyle Lancets Misc (Lancets)  Other Orders: UA Dipstick w/o Micro (automated)  (81003) Venipuncture [id number]) T-Vitamin D (25-Hydroxy) [id number]) TLB-BMP (Basic Metabolic Panel-BMET) (99991111) TLB-CBC Platelet - w/Differential (85025-CBCD) TLB-TSH (Thyroid Stimulating Hormone) (84443-TSH) TLB-A1C / Hgb A1C (Glycohemoglobin) (83036-A1C) TLB-Microalbumin/Creat Ratio, Urine (82043-MALB)  Patient Instructions: 1)  to left knee cold pacgk 15 minutes, wait 5-10 minuteds then apply heat 20-30 minutes 2)  continue exercises rt shoulder 3)  refilled medications Prescriptions: TEMAZEPAM 30 MG CAPS (TEMAZEPAM) 1 nhs for sleep  #30 x 5   Entered and Authorized by:   Emeterio Reeve MD   Signed by:   Emeterio Reeve MD on 11/02/2008   Method used:   Print then Give to Patient   RxID:   [id number] FUROSEMIDE 40 MG  TABS (FUROSEMIDE) 1 each morning for edema  #30 x 11   Entered and Authorized by:   Emeterio Reeve MD   Signed by:   Emeterio Reeve MD on 11/02/2008   Method used:   Electronically to        [id number] (retail)       [place], [place]  [address]       Ph: [phone]       Fax: [phone]   RxID:   [id number] ESTRADIOL 1 MG  TABS (ESTRADIOL) once daily  #90 x 3   Entered and Authorized by:   Emeterio Reeve MD   Signed by:   Emeterio Reeve MD on 11/02/2008   Method used:   Electronically to        [facility] (mail-order)             ,          Ph: [phone]       Fax: [phone]   RxID:   [id number] NEXIUM 40 MG  CPDR (ESOMEPRAZOLE MAGNESIUM) once daily  #90 x 3   Entered and Authorized by:   Emeterio Reeve MD   Signed by:   Emeterio Reeve MD on 11/02/2008   Method used:   Electronically to        [facility] (mail-order)             ,          Ph: [phone]       Fax: [phone]   RxID:   [id number] LIPITOR 10 MG  TABS (ATORVASTATIN CALCIUM) once daily  #90 x 3   Entered and Authorized by:   Emeterio Reeve MD   Signed by:   Emeterio Reeve MD on 11/02/2008   Method used:   Faxed to ...       [facility] (mail-order)             ,          Ph: [phone]  Fax: [phone]   [id number] GLIPIZIDE XL 5 MG  TB24 (GLIPIZIDE) once daily  #90 x 3   Entered and Authorized by:   Emeterio Reeve MD   Signed by:   Emeterio Reeve MD on 11/02/2008   Method used:   Electronically to        [facility] (mail-order)             ,          Ph: [phone]       Fax: [phone]   RxID:   [id number]        Preventive Care Screening  Hemoccult:    Date:  10/21/2007    Results:  negative x 3   Pap Smear:    Date:  10/08/2007    Results:  normal   Bone Density:    Date:  10/07/2006    Results:  abnormal std dev   Laboratory Results   Urine Tests    Routine Urinalysis   Color: yellow Appearance: Clear Glucose: negative   (Normal Range: Negative) Bilirubin: 1+   (Normal Range: Negative) Ketone: trace (5)   (Normal Range: Negative) Spec. Gravity: 1.025   (Normal  Range: 1.003-1.035) Blood: negative   (Normal Range: Negative) pH: 5.0   (Normal Range: 5.0-8.0) Protein: 1+   (Normal Range: Negative) Urobilinogen: 0.2   (Normal Range: 0-1) Nitrite: negative   (Normal Range: Negative) Leukocyte Esterace: negative   (Normal Range: Negative)    Comments: Joyce Gross  November 02, 2008 1:57 PM

## 2010-08-07 NOTE — Assessment & Plan Note (Signed)
Summary: EMP-WILL FAST//CCM   Vital Signs:  Patient profile:   77 year old female Menstrual status:  hysterectomy Height:      61 inches Weight:      164 pounds BMI:     31.10 O2 Sat:      94 % Temp:     98 degrees F Pulse rate:   100 / minute BP sitting:   140 / 76  (left arm)  Vitals Entered By: Levora Angel, RN (Nov 15, 2009 8:38 AM)  Contraindications/Deferment of Procedures/Staging:    Test/Procedure: TD vaccine    Reason for deferment: declined  CC: refill to [facility] go over problems fasting for labs   Is Patient Diabetic? Yes     Menstrual Status hysterectomy Last PAP Result normal   History of Present Illness: This 77 year old white overweight very female is in to go over her medical problems and refill of less certain medication she is known diabetic and her CBGs have been 100-120 He relates her right shoulder which had surgery at 4 and 7 and she has no other major complaints except that she has gained considerable amount of weight Head mammogram and 2010 and will schedule this year. Colonoscopic exam is due in 2013 EKG last year was normal and no chest pain or coronary symptoms Hysterectomy 1975 No other complaint  Allergies (verified): No Known Drug Allergies  Past History:  Past Medical History: Last updated: 11/07/2006 Dizziness or vertigo GERD Hyperlipidemia  Past Surgical History: Carpal tunnel release Hysterectomy partial  1975  Total knee replacement volar retinacular ganglion rt index finger  Review of Systems  The patient denies anorexia, fever, weight loss, weight gain, vision loss, decreased hearing, hoarseness, chest pain, syncope, dyspnea on exertion, peripheral edema, prolonged cough, headaches, hemoptysis, abdominal pain, melena, hematochezia, severe indigestion/heartburn, hematuria, incontinence, genital sores, muscle weakness, suspicious skin lesions, transient blindness, difficulty walking, depression, unusual weight change, abnormal  bleeding, enlarged lymph nodes, angioedema, breast masses, and testicular masses.    Physical Exam  General:  Well-developed,well-nourished,in no acute distress; alert,appropriate and cooperative throughout examinationoverweight-appearing.   Head:  Normocephalic and atraumatic without obvious abnormalities. No apparent alopecia or balding. Eyes:  No corneal or conjunctival inflammation noted. EOMI. Perrla. Funduscopic exam benign, without hemorrhages, exudates or papilledema. Vision grossly normal. Ears:  External ear exam shows no significant lesions or deformities.  Otoscopic examination reveals clear canals, tympanic membranes are intact bilaterally without bulging, retraction, inflammation or discharge. Hearing is grossly normal bilaterally. Nose:  External nasal examination shows no deformity or inflammation. Nasal mucosa are pink and moist without lesions or exudates. Mouth:  Oral mucosa and oropharynx without lesions or exudates.  Teeth in good repair. Neck:  No deformities, masses, or tenderness noted. Chest Wall:  No deformities, masses, or tenderness noted. Breasts:  No mass, nodules, thickening, tenderness, bulging, retraction, inflamation, nipple discharge or skin changes noted.   Lungs:  Normal respiratory effort, chest expands symmetrically. Lungs are clear to auscultation, no crackles or wheezes. Heart:  Normal rate and regular rhythm. S1 and S2 normal without gallop, murmur, click, rub or other extra sounds. Abdomen:  Bowel sounds positive,abdomen soft and non-tender without masses, organomegaly or hernias noted. Rectal:  not examined Genitalia:  none exam Msk:  No deformity or scoliosis noted of thoracic or lumbar spine.  right shoulder scar full range of motion of the right shoulderleft knee replacement on the exam otherwise Pulses:  R and L carotid,radial,femoral,dorsalis pedis and posterior tibial pulses are full and equal bilaterally  Extremities:  No clubbing, cyanosis,  edema, or deformity noted with normal full range of motion of all joints.   Neurologic:  No cranial nerve deficits noted. Station and gait are normal. Plantar reflexes are down-going bilaterally. DTRs are symmetrical throughout. Sensory, motor and coordinative functions appear intact. Skin:  Intact without suspicious lesions or rashes Cervical Nodes:  No lymphadenopathy noted Axillary Nodes:  No palpable lymphadenopathy Inguinal Nodes:  No significant adenopathy Psych:  Cognition and judgment appear intact. Alert and cooperative with normal attention span and concentration. No apparent delusions, illusions, hallucinations   Impression & Recommendations:  Problem # 1:  ARTHRITIS, CERVICAL SPINE (ICD-721.90) Assessment Improved  Problem # 2:  EDEMA (ICD-782.3) Assessment: Improved  Her updated medication list for this problem includes:    Furosemide 40 Mg Tabs (Furosemide) .Marland Kitchen... 1 each morning for edema  Problem # 3:  TOTAL KNEE REPLACEMENT, LEFT, HX OF (ICD-V43.65) Assessment: Improved  Problem # 4:  ARTHRITIS (ICD-716.90) Assessment: Improved  Problem # 5:  EXOGENOUS OBESITY (ICD-278.00) Assessment: New weight reduction diet and increase exercise  Problem # 6:  DIABETES MELLITUS, TYPE II, CONTROLLED (ICD-250.00) Assessment: Improved  Her updated medication list for this problem includes:    Glipizide Xl 5 Mg Tb24 (Glipizide) ..... Once daily    Adult Aspirin Ec Low Strength 81 Mg Tbec (Aspirin)  Orders: Prescription Created Electronically [phone])  Problem # 7:  OSTEOPENIA (ICD-733.90) Assessment: Unchanged  Her updated medication list for this problem includes:    Vitamin D (ergocalciferol) 50000 Unit Caps (Ergocalciferol) .Marland Kitchen... 1 by mouth weekly  Orders: T-Vitamin D (25-Hydroxy) [phone])  Complete Medication List: 1)  Onetouch Finepoint Lancets Misc (Lancets) .... Use 1 strip as directed 2)  Onetouch Ultra Test Strp (Glucose blood) 3)  Glipizide Xl 5 Mg Tb24  (Glipizide) .... Once daily 4)  Lipitor 10 Mg Tabs (Atorvastatin calcium) .... Once daily 5)  Nexium 40 Mg Cpdr (Esomeprazole magnesium) .... Once daily 6)  Estradiol 1 Mg Tabs (Estradiol) .... Once daily 7)  Calcium With Vit D  .... Two times a day 8)  Furosemide 40 Mg Tabs (Furosemide) .Marland Kitchen.. 1 each morning for edema 9)  Adult Aspirin Ec Low Strength 81 Mg Tbec (Aspirin) 10)  Hydrocodone-acetaminophen 7.5-325 Mg Tabs (Hydrocodone-acetaminophen) .Marland Kitchen.. 1 to 2 tabs every 4 to 6 hours for pain. 11)  Percocet 10-650 Mg Tabs (Oxycodone-acetaminophen) .Marland Kitchen.. 1 q4h as needed pain 12)  Vitamin D (ergocalciferol) 50000 Unit Caps (Ergocalciferol) .Marland Kitchen.. 1 by mouth weekly 13)  Diclofenac Sodium 75 Mg Tbec (Diclofenac sodium) .Marland Kitchen.. 1 two times a day pc 14)  Onetouch Test Strp (Glucose blood) .... Test daily 15)  Onetouch Lancets Misc (Lancets) .... Test daily 16)  Onetouch Finepoint Lancets Misc (Lancets)  Other Orders: Venipuncture [id number]) UA Dipstick w/o Micro (automated)  (81003) EKG w/ Interpretation (93000) TLB-Lipid Panel (80061-LIPID) TLB-BMP (Basic Metabolic Panel-BMET) (99991111) TLB-CBC Platelet - w/Differential (85025-CBCD) TLB-Hepatic/Liver Function Pnl (80076-HEPATIC) TLB-TSH (Thyroid Stimulating Hormone) (84443-TSH) TLB-A1C / Hgb A1C (Glycohemoglobin) (83036-A1C) TLB-Microalbumin/Creat Ratio, Urine (82043-MALB)  Patient Instructions: 1)  It is important that you exercise reguarly at least 20 minutes 5 times a week. If you develop chest pain, have severe difficulty breathing, or feel very tired, stop exercising immediately and seek medical attention.  2)  You need to lose weight. Consider a lower calorie diet and regular exercise.  3)  have refilled her necessary medications and will call you regarding the labs to Prescriptions: FUROSEMIDE 40 MG  TABS (FUROSEMIDE) 1 each morning for edema  #  90 x 3   Entered and Authorized by:   Emeterio Reeve MD   Signed by:   Emeterio Reeve MD on 11/15/2009   Method used:   Faxed to ...       [facility] (mail-order)             ,          Ph: [phone]       Fax: [phone]   [id number] ESTRADIOL 1 MG  TABS (ESTRADIOL) once daily  #90 x 3   Entered and Authorized by:   Emeterio Reeve MD   Signed by:   Emeterio Reeve MD on 11/15/2009   Method used:   Faxed to ...       [facility] (mail-order)             ,          Ph: [phone]       Fax: [phone]   [id number] NEXIUM 40 MG  CPDR (ESOMEPRAZOLE MAGNESIUM) once daily  #90 x 3   Entered and Authorized by:   Emeterio Reeve MD   Signed by:   Emeterio Reeve MD on 11/15/2009   Method used:   Faxed to ...       [facility] (mail-order)             ,          Ph: [phone]       Fax: [phone]   [id number] LIPITOR 10 MG  TABS (ATORVASTATIN CALCIUM) once daily  #90 x 3   Entered and Authorized by:   Emeterio Reeve MD   Signed by:   Emeterio Reeve MD on 11/15/2009   Method used:   Faxed to ...       [facility] (mail-order)             ,          Ph: [phone]       Fax: [phone]   [id number] FUROSEMIDE 40 MG  TABS (FUROSEMIDE) 1 each morning for edema  #90 x 3   Entered and Authorized by:   Emeterio Reeve MD   Signed by:   Emeterio Reeve MD on 11/15/2009   Method used:   Electronically to        [facility] (mail-order)             ,          Ph: [phone]       Fax: [phone]   [id number] ESTRADIOL 1 MG  TABS (ESTRADIOL) once daily  #90 x 3   Entered and Authorized by:   Emeterio Reeve MD   Signed by:   Emeterio Reeve MD on 11/15/2009   Method used:   Electronically to        [facility] (mail-order)             ,          Ph: [phone]       Fax: [phone]   RxID:   [id number] NEXIUM 40 MG  CPDR (ESOMEPRAZOLE MAGNESIUM) once daily  #90 x 3   Entered and Authorized  by:   Emeterio Reeve MD   Signed by:  Emeterio Reeve MD on 11/15/2009   Method used:   Electronically to        [facility] (mail-order)             ,          Ph: [phone]       Fax: [phone]   RxID:   [id number] LIPITOR 10 MG  TABS (ATORVASTATIN CALCIUM) once daily  #90 x 3   Entered and Authorized by:   Emeterio Reeve MD   Signed by:   Emeterio Reeve MD on 11/15/2009   Method used:   Electronically to        [facility] (mail-order)             ,          Ph: [phone]       Fax: [phone]   RxID:   [id number] GLIPIZIDE XL 5 MG  TB24 (GLIPIZIDE) once daily  #90 x 3   Entered and Authorized by:   Emeterio Reeve MD   Signed by:   Emeterio Reeve MD on 11/15/2009   Method used:   Electronically to        [facility] (mail-order)             ,          Ph: [phone]       Fax: [phone]   RxID:   [id number]   Laboratory Results   Urine Tests  Date/Time Recieved: Nov 15, 2009 11:08 AM  Date/Time Reported: Nov 15, 2009 11:08 AM   Routine Urinalysis   Color: yellow Appearance: Clear Glucose: negative   (Normal Range: Negative) Bilirubin: negative   (Normal Range: Negative) Ketone: negative   (Normal Range: Negative) Spec. Gravity: 1.025   (Normal Range: 1.003-1.035) Blood: negative   (Normal Range: Negative) pH: 5.0   (Normal Range: 5.0-8.0) Protein: trace   (Normal Range: Negative) Urobilinogen: 0.2   (Normal Range: 0-1) Nitrite: negative   (Normal Range: Negative) Leukocyte Esterace: negative   (Normal Range: Negative)    Comments: Doy Hutching, CMA  Nov 15, 2009 11:08 AM

## 2010-08-07 NOTE — Assessment & Plan Note (Signed)
Summary: medical clearance/dm   Vital Signs:  Patient profile:   77 year old female Weight:      158 pounds O2 Sat:      95 % Temp:     98.5 degrees F Pulse rate:   100 / minute BP sitting:   140 / 82  (left arm) Cuff size:   regular  Vitals Entered By: Levora Angel, RN (January 25, 2009 1:58 PM) CC: needs medical release for surgical clearance.    History of Present Illness: Pt in for medical clearance surgery rt shouder. Has no complaints , has been doing well Diabetes under control with good CBGs 110-120 Systems review negative  Allergies (verified): No Known Drug Allergies  Review of Systems      See HPI General:  Denies chills, fatigue, fever, loss of appetite, malaise, sleep disorder, sweats, weakness, and weight loss. Eyes:  Denies blurring, discharge, double vision, eye irritation, eye pain, halos, itching, light sensitivity, red eye, vision loss-1 eye, and vision loss-both eyes. ENT:  Denies decreased hearing, difficulty swallowing, ear discharge, earache, hoarseness, nasal congestion, nosebleeds, postnasal drainage, ringing in ears, sinus pressure, and sore throat. GI:  Denies abdominal pain, bloody stools, change in bowel habits, constipation, dark tarry stools, diarrhea, excessive appetite, gas, hemorrhoids, indigestion, loss of appetite, nausea, vomiting, vomiting blood, and yellowish skin color. GU:  Denies abnormal vaginal bleeding, decreased libido, discharge, dysuria, genital sores, hematuria, incontinence, nocturia, urinary frequency, and urinary hesitancy. MS:  See HPI; Complains of joint pain; rt shoulder pain, for surgery. Derm:  Denies changes in color of skin, changes in nail beds, dryness, excessive perspiration, flushing, hair loss, insect bite(s), itching, lesion(s), poor wound healing, and rash. Neuro:  Denies brief paralysis, difficulty with concentration, disturbances in coordination, falling down, headaches, inability to speak, memory loss, numbness,  poor balance, seizures, sensation of room spinning, tingling, tremors, visual disturbances, and weakness.  Physical Exam  General:  Well-developed,well-nourished,in no acute distress; alert,appropriate and cooperative throughout examination Head:  Normocephalic and atraumatic without obvious abnormalities. No apparent alopecia or balding. Eyes:  No corneal or conjunctival inflammation noted. EOMI. Perrla. Funduscopic exam benign, without hemorrhages, exudates or papilledema. Vision grossly normal. Ears:  External ear exam shows no significant lesions or deformities.  Otoscopic examination reveals clear canals, tympanic membranes are intact bilaterally without bulging, retraction, inflammation or discharge. Hearing is grossly normal bilaterally. Nose:  External nasal examination shows no deformity or inflammation. Nasal mucosa are pink and moist without lesions or exudates. Mouth:  Oral mucosa and oropharynx without lesions or exudates.  Teeth in good repair. Neck:  No deformities, masses, or tenderness noted. Chest Wall:  No deformities, masses, or tenderness noted. Breasts:  NE Lungs:  Normal respiratory effort, chest expands symmetrically. Lungs are clear to auscultation, no crackles or wheezes. Heart:  Normal rate and regular rhythm. S1 and S2 normal without gallop, murmur, click, rub or other extra sounds. Abdomen:  Bowel sounds positive,abdomen soft and non-tender without masses, organomegaly or hernias noted. Rectal:  NE Genitalia:  NE Msk:  shoulder pain on movement Pulses:  R and L carotid,radial,femoral,dorsalis pedis and posterior tibial pulses are full and equal bilaterally Extremities:  No clubbing, cyanosis, edema, or deformity noted with normal full range of motion of all joints.   Neurologic:  No cranial nerve deficits noted. Station and gait are normal. Plantar reflexes are down-going bilaterally. DTRs are symmetrical throughout. Sensory, motor and coordinative functions appear  intact. Skin:  Intact without suspicious lesions or rashes Cervical Nodes:  No lymphadenopathy noted Axillary Nodes:  No palpable lymphadenopathy Inguinal Nodes:  No significant adenopathy Psych:  Cognition and judgment appear intact. Alert and cooperative with normal attention span and concentration. No apparent delusions, illusions, hallucinations   Impression & Recommendations:  Problem # 1:  SHOULDER PAIN, RIGHT (ICD-719.41) Assessment Deteriorated  Her updated medication list for this problem includes:    Adult Aspirin Ec Low Strength 81 Mg Tbec (Aspirin)    Methocarbamol 500 Mg Tabs (Methocarbamol) .Marland Kitchen... 1 by mouth three times a day.    Hydrocodone-acetaminophen 7.5-325 Mg Tabs (Hydrocodone-acetaminophen) .Marland Kitchen... 1 to 2 tabs every 4 to 6 hours for pain.    Percocet 10-650 Mg Tabs (Oxycodone-acetaminophen) .Marland Kitchen... 1 q4h as needed pain    Diclofenac Sodium 75 Mg Tbec (Diclofenac sodium) .Marland Kitchen... 1 two times a day pc  Problem # 2:  ARTHRITIS, CERVICAL SPINE (ICD-721.90) Assessment: Improved  Problem # 3:  TOTAL KNEE REPLACEMENT, LEFT, HX OF (ICD-V43.65) Assessment: Improved  Problem # 4:  ARTHRITIS (ICD-716.90) Assessment: Unchanged  Problem # 5:  DM (ICD-250.00)  Her updated medication list for this problem includes:    Glipizide Xl 5 Mg Tb24 (Glipizide) ..... Once daily    Adult Aspirin Ec Low Strength 81 Mg Tbec (Aspirin)  Problem # 6:  HYPERLIPIDEMIA (ICD-272.4) Assessment: Improved  Her updated medication list for this problem includes:    Lipitor 10 Mg Tabs (Atorvastatin calcium) ..... Once daily  Problem # 7:  GERD (ICD-530.81) Assessment: Improved  Her updated medication list for this problem includes:    Nexium 40 Mg Cpdr (Esomeprazole magnesium) ..... Once daily  Complete Medication List: 1)  Onetouch Finepoint Lancets Misc (Lancets) .... Use 1 strip as directed 2)  Onetouch Ultra Test Strp (Glucose blood) 3)  Glipizide Xl 5 Mg Tb24 (Glipizide) .... Once daily  4)  Lipitor 10 Mg Tabs (Atorvastatin calcium) .... Once daily 5)  Nexium 40 Mg Cpdr (Esomeprazole magnesium) .... Once daily 6)  Estradiol 1 Mg Tabs (Estradiol) .... Once daily 7)  Calcium With Vit D  .... Two times a day 8)  Furosemide 40 Mg Tabs (Furosemide) .Marland Kitchen.. 1 each morning for edema 9)  Adult Aspirin Ec Low Strength 81 Mg Tbec (Aspirin) 10)  Methocarbamol 500 Mg Tabs (Methocarbamol) .Marland Kitchen.. 1 by mouth three times a day. 11)  Hydrocodone-acetaminophen 7.5-325 Mg Tabs (Hydrocodone-acetaminophen) .Marland Kitchen.. 1 to 2 tabs every 4 to 6 hours for pain. 12)  Percocet 10-650 Mg Tabs (Oxycodone-acetaminophen) .Marland Kitchen.. 1 q4h as needed pain 13)  Temazepam 30 Mg Caps (Temazepam) .Marland Kitchen.. 1 nhs for sleep 14)  Vitamin D (ergocalciferol) 50000 Unit Caps (Ergocalciferol) .Marland Kitchen.. 1 by mouth weekly 15)  Diclofenac Sodium 75 Mg Tbec (Diclofenac sodium) .Marland Kitchen.. 1 two times a day pc 16)  Lexapro 10 Mg Tabs (Escitalopram oxalate) .Marland Kitchen.. 1 two times a day for depressi0on 17)  Onetouch Test Strp (Glucose blood) .... Test daily 18)  Onetouch Lancets Misc (Lancets) .... Test daily 19)  Onetouch Finepoint Lancets Misc (Lancets)  Patient Instructions: 1)  Pt examined and found to be satisfactory for surgery at this time

## 2010-08-09 NOTE — Progress Notes (Signed)
Summary: REFILL REQUEST FOR GENERIC LIPITOR  Phone Note Refill Request Message from:  Patient on July 31, 2010 4:19 PM  Refills Requested: Medication #1:  LIPITOR 10 MG  TABS once daily (GENERIC FOR MED)   Notes: Pt needs GENERIC for medication sent to [facility]  at  [phone].    Initial call taken by: Duanne Moron,  July 31, 2010 4:21 PM  Follow-up for Phone Call        Rx called to pharmacy Follow-up by: Townsend Roger, [place],  July 31, 2010 5:19 PM    Prescriptions: LIPITOR 10 MG  TABS (ATORVASTATIN CALCIUM) once daily  #90 x 3   Entered by:   Townsend Roger, [place] by:   Emeterio Reeve MD   Signed by:   Townsend Roger, CMA on 07/31/2010   Method used:   Electronically to        [facility]* (mail-order)       [place]  [address]       Ph: [phone]       Fax: [phone]   RxID:   [id number]

## 2010-10-11 ENCOUNTER — Ambulatory Visit (INDEPENDENT_AMBULATORY_CARE_PROVIDER_SITE_OTHER): Payer: Medicare Other | Admitting: Family Medicine

## 2010-10-11 VITALS — BP 150/74 | HR 101 | Temp 98.3°F | Wt 163.0 lb

## 2010-10-11 DIAGNOSIS — E119 Type 2 diabetes mellitus without complications: Secondary | ICD-10-CM

## 2010-10-11 DIAGNOSIS — B369 Superficial mycosis, unspecified: Secondary | ICD-10-CM

## 2010-10-11 DIAGNOSIS — N61 Mastitis without abscess: Secondary | ICD-10-CM

## 2010-10-11 MED ORDER — CLOTRIMAZOLE-BETAMETHASONE 1-0.05 % EX CREA: TOPICAL_CREAM | CUTANEOUS | Status: AC

## 2010-10-11 MED ORDER — DOXYCYCLINE HYCLATE 100 MG PO TABS: 100.0000 mg | ORAL_TABLET | Freq: Two times a day (BID) | ORAL | Status: AC

## 2010-10-13 LAB — BASIC METABOLIC PANEL WITH GFR
BUN: 13 mg/dL (ref 6–23)
BUN: 15 mg/dL (ref 6–23)
CO2: 27 meq/L (ref 19–32)
CO2: 27 meq/L (ref 19–32)
Calcium: 8.4 mg/dL (ref 8.4–10.5)
Calcium: 8.9 mg/dL (ref 8.4–10.5)
Chloride: 101 meq/L (ref 96–112)
Chloride: 102 meq/L (ref 96–112)
Creatinine, Ser: 0.93 mg/dL (ref 0.4–1.2)
Creatinine, Ser: 1.24 mg/dL — ABNORMAL HIGH (ref 0.4–1.2)
GFR calc non Af Amer: 42 mL/min — ABNORMAL LOW
GFR calc non Af Amer: 58 mL/min — ABNORMAL LOW
Glucose, Bld: 129 mg/dL — ABNORMAL HIGH (ref 70–99)
Glucose, Bld: 132 mg/dL — ABNORMAL HIGH (ref 70–99)
Potassium: 3.9 meq/L (ref 3.5–5.1)
Potassium: 4.1 meq/L (ref 3.5–5.1)
Sodium: 135 meq/L (ref 135–145)
Sodium: 137 meq/L (ref 135–145)

## 2010-10-13 LAB — CBC
HCT: 28.8 % — ABNORMAL LOW (ref 36.0–46.0)
HCT: 34.7 % — ABNORMAL LOW (ref 36.0–46.0)
HCT: 38.6 % (ref 36.0–46.0)
Hemoglobin: 10 g/dL — ABNORMAL LOW (ref 12.0–15.0)
Hemoglobin: 12.3 g/dL (ref 12.0–15.0)
Hemoglobin: 13.2 g/dL (ref 12.0–15.0)
MCHC: 34.6 g/dL (ref 30.0–36.0)
MCHC: 35.3 g/dL (ref 30.0–36.0)
MCHC: 34.3 g/dL (ref 30.0–36.0)
MCV: 87.8 fL (ref 78.0–100.0)
MCV: 89.9 fL (ref 78.0–100.0)
MCV: 87.6 fL (ref 78.0–100.0)
Platelets: 181 10*3/uL (ref 150–400)
Platelets: 212 10*3/uL (ref 150–400)
Platelets: 202 10*3/uL (ref 150–400)
RBC: 3.21 MIL/uL — ABNORMAL LOW (ref 3.87–5.11)
RBC: 3.96 MIL/uL (ref 3.87–5.11)
RBC: 4.41 MIL/uL (ref 3.87–5.11)
RDW: 14 % (ref 11.5–15.5)
RDW: 14.5 % (ref 11.5–15.5)
RDW: 14.2 % (ref 11.5–15.5)
WBC: 7.3 10*3/uL (ref 4.0–10.5)
WBC: 7.4 10*3/uL (ref 4.0–10.5)
WBC: 5 10*3/uL (ref 4.0–10.5)

## 2010-10-13 LAB — DIFFERENTIAL
Basophils Absolute: 0 10*3/uL (ref 0.0–0.1)
Basophils Relative: 1 % (ref 0–1)
Eosinophils Absolute: 0.2 10*3/uL (ref 0.0–0.7)
Eosinophils Relative: 4 % (ref 0–5)
Lymphocytes Relative: 35 % (ref 12–46)
Lymphs Abs: 1.8 10*3/uL (ref 0.7–4.0)
Monocytes Absolute: 0.5 10*3/uL (ref 0.1–1.0)
Monocytes Relative: 9 % (ref 3–12)
Neutro Abs: 2.5 10*3/uL (ref 1.7–7.7)
Neutrophils Relative %: 50 % (ref 43–77)

## 2010-10-13 LAB — BASIC METABOLIC PANEL
BUN: 19 mg/dL (ref 6–23)
CO2: 30 mEq/L (ref 19–32)
Calcium: 9.5 mg/dL (ref 8.4–10.5)
Chloride: 102 mEq/L (ref 96–112)
Creatinine, Ser: 0.99 mg/dL (ref 0.4–1.2)
GFR calc Af Amer: >60 mL/min (ref >60)
GFR calc non Af Amer: 55 mL/min — ABNORMAL LOW (ref >60)
Glucose, Bld: 93 mg/dL (ref 70–99)
Potassium: 4.3 mEq/L (ref 3.5–5.1)
Sodium: 139 mEq/L (ref 135–145)

## 2010-10-13 LAB — GLUCOSE, CAPILLARY
Glucose-Capillary: 112 mg/dL — ABNORMAL HIGH (ref 70–99)
Glucose-Capillary: 113 mg/dL — ABNORMAL HIGH (ref 70–99)
Glucose-Capillary: 124 mg/dL — ABNORMAL HIGH (ref 70–99)
Glucose-Capillary: 129 mg/dL — ABNORMAL HIGH (ref 70–99)
Glucose-Capillary: 136 mg/dL — ABNORMAL HIGH (ref 70–99)
Glucose-Capillary: 138 mg/dL — ABNORMAL HIGH (ref 70–99)
Glucose-Capillary: 138 mg/dL — ABNORMAL HIGH (ref 70–99)

## 2010-10-13 LAB — URINALYSIS, ROUTINE W REFLEX MICROSCOPIC
Bilirubin Urine: NEGATIVE
Glucose, UA: NEGATIVE mg/dL
Hgb urine dipstick: NEGATIVE
Ketones, ur: NEGATIVE mg/dL
Nitrite: NEGATIVE
Protein, ur: NEGATIVE mg/dL
Specific Gravity, Urine: 1.018 (ref 1.005–1.030)
Urobilinogen, UA: 0.2 mg/dL (ref 0.0–1.0)
pH: 6.5 (ref 5.0–8.0)

## 2010-10-13 LAB — PROTIME-INR
INR: 1 (ref 0.00–1.49)
Prothrombin Time: 12.9 seconds (ref 11.6–15.2)

## 2010-10-13 LAB — APTT: aPTT: 27 s (ref 24–37)

## 2010-10-18 ENCOUNTER — Telehealth: Payer: Self-pay | Admitting: *Deleted

## 2010-10-18 NOTE — Telephone Encounter (Signed)
Pt is doing much better, and the swelling is better and her nipple is better. No more leaking.

## 2010-10-22 LAB — BASIC METABOLIC PANEL
BUN: 12 mg/dL (ref 6–23)
BUN: 15 mg/dL (ref 6–23)
BUN: 16 mg/dL (ref 6–23)
BUN: 18 mg/dL (ref 6–23)
CO2: 27 mEq/L (ref 19–32)
CO2: 28 mEq/L (ref 19–32)
CO2: 28 mEq/L (ref 19–32)
CO2: 29 mEq/L (ref 19–32)
Calcium: 7.9 mg/dL — ABNORMAL LOW (ref 8.4–10.5)
Calcium: 7.9 mg/dL — ABNORMAL LOW (ref 8.4–10.5)
Calcium: 8.2 mg/dL — ABNORMAL LOW (ref 8.4–10.5)
Calcium: 8.3 mg/dL — ABNORMAL LOW (ref 8.4–10.5)
Chloride: 102 mEq/L (ref 96–112)
Chloride: 104 mEq/L (ref 96–112)
Chloride: 105 mEq/L (ref 96–112)
Chloride: 106 mEq/L (ref 96–112)
Creatinine, Ser: 0.94 mg/dL (ref 0.4–1.2)
Creatinine, Ser: 1.02 mg/dL (ref 0.4–1.2)
Creatinine, Ser: 1.04 mg/dL (ref 0.4–1.2)
Creatinine, Ser: 1.1 mg/dL (ref 0.4–1.2)
GFR calc Af Amer: 58 mL/min — ABNORMAL LOW (ref >60)
GFR calc Af Amer: >60 mL/min (ref >60)
GFR calc Af Amer: >60 mL/min (ref >60)
GFR calc Af Amer: >60 mL/min (ref >60)
GFR calc non Af Amer: 48 mL/min — ABNORMAL LOW (ref >60)
GFR calc non Af Amer: 52 mL/min — ABNORMAL LOW (ref >60)
GFR calc non Af Amer: 53 mL/min — ABNORMAL LOW (ref >60)
GFR calc non Af Amer: 58 mL/min — ABNORMAL LOW (ref >60)
Glucose, Bld: 125 mg/dL — ABNORMAL HIGH (ref 70–99)
Glucose, Bld: 131 mg/dL — ABNORMAL HIGH (ref 70–99)
Glucose, Bld: 169 mg/dL — ABNORMAL HIGH (ref 70–99)
Glucose, Bld: 61 mg/dL — ABNORMAL LOW (ref 70–99)
Potassium: 3.7 mEq/L (ref 3.5–5.1)
Potassium: 4 mEq/L (ref 3.5–5.1)
Potassium: 4.1 mEq/L (ref 3.5–5.1)
Potassium: 4.2 mEq/L (ref 3.5–5.1)
Sodium: 137 mEq/L (ref 135–145)
Sodium: 137 mEq/L (ref 135–145)
Sodium: 138 mEq/L (ref 135–145)
Sodium: 139 mEq/L (ref 135–145)

## 2010-10-22 LAB — CBC
HCT: 23.7 % — ABNORMAL LOW (ref 36.0–46.0)
HCT: 26.8 % — ABNORMAL LOW (ref 36.0–46.0)
HCT: 28.3 % — ABNORMAL LOW (ref 36.0–46.0)
HCT: 30.4 % — ABNORMAL LOW (ref 36.0–46.0)
Hemoglobin: 10.5 g/dL — ABNORMAL LOW (ref 12.0–15.0)
Hemoglobin: 8.1 g/dL — ABNORMAL LOW (ref 12.0–15.0)
Hemoglobin: 9 g/dL — ABNORMAL LOW (ref 12.0–15.0)
Hemoglobin: 9.6 g/dL — ABNORMAL LOW (ref 12.0–15.0)
MCHC: 33.5 g/dL (ref 30.0–36.0)
MCHC: 33.9 g/dL (ref 30.0–36.0)
MCHC: 34.1 g/dL (ref 30.0–36.0)
MCHC: 34.4 g/dL (ref 30.0–36.0)
MCV: 91 fL (ref 78.0–100.0)
MCV: 91 fL (ref 78.0–100.0)
MCV: 91.7 fL (ref 78.0–100.0)
MCV: 91.9 fL (ref 78.0–100.0)
Platelets: 170 10*3/uL (ref 150–400)
Platelets: 175 10*3/uL (ref 150–400)
Platelets: 176 10*3/uL (ref 150–400)
Platelets: 192 10*3/uL (ref 150–400)
RBC: 2.58 MIL/uL — ABNORMAL LOW (ref 3.87–5.11)
RBC: 2.93 MIL/uL — ABNORMAL LOW (ref 3.87–5.11)
RBC: 3.11 MIL/uL — ABNORMAL LOW (ref 3.87–5.11)
RBC: 3.34 MIL/uL — ABNORMAL LOW (ref 3.87–5.11)
RDW: 12.8 % (ref 11.5–15.5)
RDW: 12.8 % (ref 11.5–15.5)
RDW: 13 % (ref 11.5–15.5)
RDW: 14 % (ref 11.5–15.5)
WBC: 5.4 10*3/uL (ref 4.0–10.5)
WBC: 5.7 10*3/uL (ref 4.0–10.5)
WBC: 5.9 10*3/uL (ref 4.0–10.5)
WBC: 7.1 10*3/uL (ref 4.0–10.5)

## 2010-10-22 LAB — GLUCOSE, CAPILLARY
Glucose-Capillary: 119 mg/dL — ABNORMAL HIGH (ref 70–99)
Glucose-Capillary: 128 mg/dL — ABNORMAL HIGH (ref 70–99)
Glucose-Capillary: 131 mg/dL — ABNORMAL HIGH (ref 70–99)
Glucose-Capillary: 135 mg/dL — ABNORMAL HIGH (ref 70–99)
Glucose-Capillary: 135 mg/dL — ABNORMAL HIGH (ref 70–99)
Glucose-Capillary: 136 mg/dL — ABNORMAL HIGH (ref 70–99)
Glucose-Capillary: 143 mg/dL — ABNORMAL HIGH (ref 70–99)
Glucose-Capillary: 155 mg/dL — ABNORMAL HIGH (ref 70–99)
Glucose-Capillary: 157 mg/dL — ABNORMAL HIGH (ref 70–99)
Glucose-Capillary: 165 mg/dL — ABNORMAL HIGH (ref 70–99)
Glucose-Capillary: 188 mg/dL — ABNORMAL HIGH (ref 70–99)
Glucose-Capillary: 41 mg/dL — ABNORMAL LOW (ref 70–99)
Glucose-Capillary: 46 mg/dL — ABNORMAL LOW (ref 70–99)
Glucose-Capillary: 61 mg/dL — ABNORMAL LOW (ref 70–99)
Glucose-Capillary: 74 mg/dL (ref 70–99)
Glucose-Capillary: 78 mg/dL (ref 70–99)
Glucose-Capillary: 89 mg/dL (ref 70–99)
Glucose-Capillary: 95 mg/dL (ref 70–99)
Glucose-Capillary: 99 mg/dL (ref 70–99)

## 2010-10-22 LAB — PROTIME-INR
INR: 1.1 (ref 0.00–1.49)
INR: 1.4 (ref 0.00–1.49)
INR: 1.7 — ABNORMAL HIGH (ref 0.00–1.49)
INR: 1.8 — ABNORMAL HIGH (ref 0.00–1.49)
Prothrombin Time: 14.3 seconds (ref 11.6–15.2)
Prothrombin Time: 17.2 seconds — ABNORMAL HIGH (ref 11.6–15.2)
Prothrombin Time: 20.8 seconds — ABNORMAL HIGH (ref 11.6–15.2)
Prothrombin Time: 21.5 seconds — ABNORMAL HIGH (ref 11.6–15.2)

## 2010-10-22 LAB — TYPE AND SCREEN
ABO/RH(D): O POS
Antibody Screen: NEGATIVE

## 2010-10-22 LAB — ABO/RH: ABO/RH(D): O POS

## 2010-10-22 LAB — PREPARE RBC (CROSSMATCH)

## 2010-10-22 NOTE — Telephone Encounter (Signed)
Does pt need to come back to see Dr. Joni Fears?

## 2010-10-23 NOTE — Telephone Encounter (Signed)
If pt feels she is completely weel does not need to come in, but if any reoccurance of problem, will be happy to se her

## 2010-10-26 ENCOUNTER — Encounter: Payer: Self-pay | Admitting: Family Medicine

## 2010-10-26 NOTE — Progress Notes (Signed)
  Subjective:    Patient ID: [name], female    DOB: 17-Oct-1929, 77 y.o.   MRN: [id number] This 77 year old white married female diabetes mellitus type 2 complaining of right breast itching around her nipple approximately 2 weeks ago and then in the past today began to oozing liquid had no pain Burns when it itches her scratches at. She had no symptoms of the left breast CBG 109 this a.m. Patient had a mammogram June 12, 1999 level was negative No other complaint HPI    Review of SystemsC. History of present illness    Objective:   Physical Exam the patient is a well-developed well-nourished white female slightly overweight appears to be in no distress Right breast examination reveals erythematous slightly swollen difficult for her to the left Right we'll lower lateral quadrant is thickened tender to palpation no masses felt Left breast normal Axilla negative no lymphadenopathy       Assessment & Plan:

## 2010-10-26 NOTE — Patient Instructions (Signed)
Her diagnosis is out of cystic mastitis and the right breast 2 treat with doxycycline 100 mg b.i.d. For 10 days Apply  Feet 23rd of minutes t.i.d. Lotrisone to rash around the nipple

## 2010-11-20 NOTE — H&P (Signed)
[name], [name]               ACCOUNT NO.:  [id number]   MEDICAL RECORD NO.:  [id number]          PATIENT TYPE:  INP   LOCATION:                               FACILITY:  [facility]   PHYSICIAN:  Gaynelle Arabian, M.D.    DATE OF BIRTH:  [birth year]   DATE OF ADMISSION:  07/11/2008  DATE OF DISCHARGE:                              HISTORY & PHYSICAL   CHIEF COMPLAINT:  Left knee pain.   HISTORY OF PRESENT ILLNESS:  The patient is a 77 year old female who has  been seen by Dr. Wynelle Link for ongoing left knee pain.  She has had  progressive worsening pain and difficulty and stiffness with her knee.  She is found to have bone-on-bone arthritis.  She has been seen by Dr.  Joni Fears preoperatively and felt to be satisfactory physically for her  knee surgery.  Risks and benefits discussed.  Patient subsequently  admitted to the hospital.   ALLERGIES:  NO KNOWN DRUG ALLERGIES.   CURRENT MEDICATIONS:  Lipitor, Nexium, glipizide, diclofenac, estradiol,  calcium, furosemide.   PAST MEDICAL HISTORY:  Migraines, vertigo, cataracts, varicose veins,  hiatal hernia, non-insulin-dependent diabetes mellitus, postmenopausal.   PAST SURGICAL HISTORY:  Partial hysterectomy, right knee replacement,  hernia repair.   FAMILY HISTORY:  Deceased father, deceased at 36 with heart problems.  Mother deceased at age 68 with pancreatic cancer.   SOCIAL HISTORY:  Married, retired, nonsmoker, no alcohol, two children.  Husband will be assistive with care after surgery.   REVIEW OF SYSTEMS:  GENERAL:  No fevers, chills, night sweats.  NEURO:  No seizures, syncope or paralysis.  RESPIRATORY:  No shortness of  breath, productive cough or hemoptysis.  CARDIOVASCULAR:  No chest pain,  no orthopnea.  GI:  A little bit nocturia, no dysuria or hematuria.  MUSCULOSKELETAL:  Joint pain at the left knee.   PHYSICAL EXAMINATION:  VITAL SIGNS:  Pulse 76, respirations 14, blood  pressure 148/74.  GENERAL:  A 35-year white  female, well-nourished, well-developed, mildly  anxious, no acute distress.  Alert and oriented and cooperative,  pleasant.  HEENT: Normocephalic, atraumatic.  Pupils are round and reactive, EOMs  intact.  NECK:  Supple.  CHEST:  Clear.  HEART:  Regular rate and rhythm, no murmur, S1-S2.  Abdomen:  Soft, nontender.  Bowel sounds present.  Rectal, breasts genitalia not done and not pertinent to the present  illness.  EXTREMITIES:  Left knee slight varus, marked crepitus.  Range of motion  5-120.   IMPRESSION:  Osteoarthritis left knee.   PLAN:  The patient admitted to [facility] to undergo a left  total knee surgery, will be performed by Dr. Gaynelle Arabian.      Alexzandrew L. Perkins, P.A.C.      Gaynelle Arabian, M.D.  Electronically Signed    ALP/MEDQ  D:  07/10/2008  T:  07/11/2008  Job:  [id number]   cc:   Gaynelle Arabian, M.D.  Fax: [phone]   Shanna Cisco., MD  [place]  [place] [address]

## 2010-11-20 NOTE — Op Note (Signed)
[name], [name]               ACCOUNT NO.:  [id number]   MEDICAL RECORD NO.:  [id number]          PATIENT TYPE:  INP   LOCATION:  [place]                         FACILITY:  [facility]   PHYSICIAN:  Gaynelle Arabian, M.D.    DATE OF BIRTH:  1929-11-04   DATE OF PROCEDURE:  07/11/2008  DATE OF DISCHARGE:                               OPERATIVE REPORT   PREOPERATIVE DIAGNOSIS:  Osteoarthritis left knee.   POSTOPERATIVE DIAGNOSIS:  Osteoarthritis left knee.   PROCEDURE:  Left total knee arthroplasty.   SURGEON:  Gaynelle Arabian, M.D.   ASSISTANT:  Alexzandrew L. Perkins, P.A.C.   ANESTHESIA:  Spinal.   ESTIMATED BLOOD LOSS:  Minimal.   DRAIN:  None.   TOURNIQUET TIME:  33 minutes at 300 mmHg.   COMPLICATIONS:  None.   CONDITION:  Stable to recovery.   BRIEF CLINICAL NOTE:  [name] is a 77 year old female with end-stage  arthritis of the left knee with progressively worsening pain and  dysfunction.  She has failed nonoperative management and presents now  for a left total knee arthroplasty.   PROCEDURE IN DETAIL:  After the successful administration of spinal  anesthetic a tourniquet was placed high on her left thigh and the left  lower extremity prepped and draped in the usual sterile fashion.  The  extremity was wrapped in an Esmarch, knee flexed and tourniquet inflated  to 300 mmHg.  A midline incision was made with a 10 blade through  subcutaneous tissue to the level of the extensor mechanism.  A fresh  blade was used make a medial parapatellar arthrotomy.  Soft tissue over  the proximal medial tibia was subperiosteally elevated to the joint line  with the knife and into the semimembranosus bursa with a Cobb elevator.  Soft tissue laterally was elevated with attention being paid to avoid  the patellar tendon on the tibial tubercle.  The patella subluxed  laterally and the knee flexed 90 degrees and ACL and PCL removed.  A  drill was used create a starting hole in the distal  femur and the canal  was thoroughly irrigated.  A 5 degrees left valgus alignment guide was  placed and referencing off the posterior condyles, rotation marked and  block pin to remove 10 mm off the distal femur.  Distal femoral  resection was made with an oscillating saw.  A sizing block was placed,  a size 3 was the most appropriate.  Rotation was marked off the  epicondylar axis.  A size 3 cutting block was placed and the anterior,  posterior and chamfer cuts made.   The tibia subluxed forward and menisci removed.  Extramedullary tibial  alignment guide was placed referencing proximally at the medial aspect  of the tibial tubercle and distally along the second metatarsal axis and  tibial crest.  A block pin to remove 10 mm off the non-deficient lateral  side.  Tibial resection was made with an oscillating saw.  A size 2.5  was most the appropriate tibial component and the proximal tibia was  prepared with the modular drill and keel punch for the  size 2.5.  Femoral preparation was completed with the intercondylar cut for the  size 3.   A size 2.5 mobile bearing tibial trial, size 3 posterior stabilized  femoral trial and a 10 mm posterior stabilized rotating platform insert  trial were placed.  With the 10, full extension was achieved with  excellent varus-valgus, anterior and posterior balance throughout full  range of motion.  The patella was everted and thickness measured to be  22 mm.  Freehand resection was taken to 12 mm, 38 template was placed,  lug holes were drilled, trial patella was placed and it tracked  normally.  Osteophytes removed off the posterior femur with the trial in  place.  All trials were removed and the cut bone surfaces were prepared  with pulsatile lavage.  Cement was mixed and once ready for  implantation, the size 2.5 mobile bearing tibial tray, size 3 posterior  stabilized femur and the 38 patella were cemented into place and patella  was held with a  clamp.  The trial 10-mm insert was placed and the knee  held in full extension and all extruded cement removed.  When the cement  had fully hardened, then the permanent 10-mm posterior stabilized  rotating platform insert was placed into the tibial tray.  The wound was  copiously irrigated with saline solution.  FloSeal was then injected on  the posterior capsule, mediolateral gutters and suprapatellar area.  A  moist sponge was placed and tourniquet released for a total time of 33  minutes.  The sponges held for 2 minutes and then removed.  Minimal  bleeding was encountered.  The bleeding that was encountered was stopped  with electrocautery.  The wound was again irrigated with saline solution  and the arthrotomy closed with interrupted #1 PDS.  Flexion against  gravity was 135 degrees.  Subcu was then closed with interrupted 2-0  Vicryl and subcuticular running 4-0 Monocryl.  The incision was cleaned  and dried and Steri-Strips and a bulky sterile dressing applied.  She  was then placed into a knee immobilizer, awakened and transported to  recovery in stable condition.      Gaynelle Arabian, M.D.  Electronically Signed     FA/MEDQ  D:  07/11/2008  T:  07/11/2008  Job:  [id number]

## 2010-11-20 NOTE — Discharge Summary (Signed)
NAMEAMAHRI, HOPPES               ACCOUNT NO.:  [id number]   MEDICAL RECORD NO.:  [id number]          PATIENT TYPE:  OIB   LOCATION:  5034                         FACILITY:  [facility]   PHYSICIAN:  Marcello Moores B. Dixon, P.A.  DATE OF BIRTH:  12-06-1929   DATE OF ADMISSION:  02/20/2009  DATE OF DISCHARGE:  02/22/2009                               DISCHARGE SUMMARY   ADMISSION DIAGNOSIS:  Right shoulder avascular necrosis.   DISCHARGE DIAGNOSIS:  Right shoulder avascular necrosis, status post  hemiarthroplasty.   BRIEF HISTORY:  The patient is a 77 year old female with worsening right  shoulder pain secondary to avascular necrosis.  The patient was elected  for surgical management for pain relief.   PROCEDURE:  The patient had right shoulder hemiarthroplasty by Dr.  Esmond Plants on February 20, 2009.  Assistant was [employer], PA-C.  General anesthesia was used.  No complications.   HOSPITAL COURSE:  The patient was admitted on February 20, 2009 for the  above-stated procedure, which she tolerated well.  After adequate time  in the postanesthesia care unit, she was transferred up to 5000.  On  postop day #1, the patient complained about some mild pain to the right  shoulder, but was able to work with physical therapy on postop day #1  and 2#, was feeling much better on postop day #2.  Labs were within  acceptable limits.  Neurologically, she is intact.  Thus, she was  discharged home after physical therapy.   DISCHARGE PLAN:  The patient will be discharged home with follow up in 2  weeks with Dr. Esmond Plants.   Her condition is stable.  Diet is regular.  The patient has allergies to  [facility] and AMITRIPTYLINE.   DISCHARGE MEDICATIONS:  1. Aspirin 81 mg p.o. daily.  2. Voltaren 75 mg b.i.d. p.r.n.  3. Estradiol 1 mg daily.  4. Lasix 40 mg daily.  5. Glucotrol 5 mg daily.  6. Insulin per sliding scale.  7. Protonix 40 mg daily.  8. Robaxin 500 mg p.o. q.6 h.  9. Percocet 5/325 one  to two tablets p.o. q.4-6 h. p.r.n. pain.   FOLLOWUP:  The patient is to follow up back with Dr. Esmond Plants in 2  weeks.      Thomas B. Mayo Ao.    TBD/MEDQ  D:  02/22/2009  T:  02/22/2009  Job:  [id number]

## 2010-11-20 NOTE — Op Note (Signed)
NAMEARIELLA, [name]               ACCOUNT NO.:  [id number]   MEDICAL RECORD NO.:  [id number]          PATIENT TYPE:  OIB   LOCATION:  5034                         FACILITY:  [facility]   PHYSICIAN:  Doran Heater. Veverly Fells, M.D. DATE OF BIRTH:  Nov 18, 1929   DATE OF PROCEDURE:  02/20/2009  DATE OF DISCHARGE:                               OPERATIVE REPORT   PREOPERATIVE DIAGNOSIS:  Right shoulder avascular necrosis with  collapse.   POSTOPERATIVE DIAGNOSIS:  Right shoulder avascular necrosis with  collapse.   PROCEDURE PERFORMED:  Right shoulder hemiarthroplasty using [place].   ATTENDING SURGEON:  Doran Heater. Veverly Fells, MD   ASSISTANT:  Abbott Pao. Dixon, PA-C   ANESTHESIA:  General anesthesia was used plus interscalene block.   ESTIMATED BLOOD LOSS:  Minimal.   FLUID REPLACEMENT:  1200 mL of crystalloid.   INSTRUMENT COUNTS:  Correct.   COMPLICATIONS:  None.   Antibiotics were given.   INDICATIONS:  The patient is a 77 year old female with worsening right  shoulder pain secondary to AVN of her humeral head with collapse.  The  patient has failed conservative management up to this point and has had  cortisone injection therapy, activity modifications and presents now  with refractory shoulder pain desiring operative treatment.  Informed  consent obtained.   DESCRIPTION OF PROCEDURE:  After an adequate level of anesthesia  achieved, the patient was positioned in the modified beach-chair  position.  Right shoulder exam under anesthesia, the patient had severe  stiffness including about a 20 degree arc of internal and external  rotation and a passive forward elevation _____________about 60 degrees.  Following this, we went in and sterilely prepped and draped the shoulder  and the arm in the usual manner.  We entered the shoulder through  deltopectoral approach using a 10 blade scalpel.  Dissection was carried  sharply down through the subcutaneous tissues.  We  identified the  cephalic vein and took that laterally with the deltoid, the pectoralis  was taken medially and the upper centimeter of its tendon released.  The  conjoined tendon was identified and taken medially.  We identified the  subscapularis tendon and released that off of the lesser tuberosity  placing #2 FiberWire sutures into the free edge of the tendon.  We then  went ahead and released the soft tissues off the humerus externally  rotating the humerus.  The humeral head was completely collapsed with  the articular cartilage basically shearing off.  We went ahead and  performed an osteotomy with an oscillating saw using a neck resection  guide with about 10-15 degrees of retroversion on the cut.  Once we made  that cut, we inspected the glenoid.  There was a degenerative labral  tear which we basically removed the torn portion of the labrum and  performed a biceps tenotomy and tenodesis.  We then went ahead and  inspected the remaining cartilage on the glenoid which was intact and  the posterior, inferior and anterior labrum was intact.  We released the  subscapularis that was free to move from the underlying capsule, it  was  contracted and attached to the structures.  We were careful to protect  the axillary nerve during the procedure.  At this point, we went ahead  and prepared our humerus with sequential reaming.  We reamed up to a  size 8.  In trying to ream to attempt, we simply could not get the  reamer down and went ahead and selected the size 8 stem.  We broach for  an 8.  With the 8 stem in place, we reduced the shoulder with a 18 x 40  head and we were happy with soft tissue balancing and tensioning.  At  this point, we removed the trial components.  We then went ahead and  selected the real size 8 stem.  We placed a #2 FiberWire suture through  drill holes in the humerus to help repair of the subscap.  We then went  ahead and used some impaction grafting technique for  the proximal  humerus and impacted the size 8 Global Advantage Stem by DePuy in place  again with about 10 degrees of retroversion.  At this point, we had  excellent coverage with the size 40 head so we went ahead and selected  the real size 40 x 18 head and impacted that into place and we did have  one round-the-world suture through the medial fin that we had placed  prior to putting the stem down.  We then repaired meticulously the  subscapularis anatomically with combination of the Mason-Allen sutures  that we had in the free edge of the tendon also the sutures through the  drill holes in the bone and through the stem and then oversewn the  rotator interval.  We were happy with that low profile repair which was  not under significant tension even external rotating up to about 45  degrees.  We thoroughly irrigated and closed the deltopectoral interval  with 0 Vicryl suture followed by 2-0 Vicryl subcutaneous closure and 4-0  Monocryl for the skin.  Steri-Strips applied followed by a sterile  dressing.  The patient tolerated the surgery well.      Doran Heater. Veverly Fells, M.D.  Electronically Signed     SRN/MEDQ  D:  02/21/2009  T:  02/21/2009  Job:  [id number]

## 2010-11-20 NOTE — H&P (Signed)
[name], [name]               ACCOUNT NO.:  [id number]   MEDICAL RECORD NO.:  [id number]          PATIENT TYPE:  INP   LOCATION:  NA                           FACILITY:  [facility]   PHYSICIAN:  Gaynelle Arabian, M.D.    DATE OF BIRTH:  1930/04/07   DATE OF ADMISSION:  03/21/2008  DATE OF DISCHARGE:                              HISTORY & PHYSICAL   DATE OF OFFICE VISIT/ HISTORY AND PHYSICAL:  February 11, 2008.   DATE OF ADMISSION:  March 21, 2008   CHIEF COMPLAINT:  Left knee pain.   HISTORY OF PRESENT ILLNESS:  The patient is a 77 year old female who was  seen by Dr. Wynelle Link for evaluation of her knees.  Has been ongoing for  quite some time now.  She has had a right total knee in the past and  actually is doing pretty well with this.  Unfortunately, the left knee  has end-stage arthritis.  She is felt to benefit from undergoing  surgical intervention.  Risks and benefits have been discussed.  She  elects to proceed with surgery.  She has been seen by Dr. Joni Fears and  felt to be stable for surgery.   ALLERGIES:  No known drug allergies.   CURRENT MEDICATIONS:  Glipizide XL, Lipitor, Estradiol, diclofenac,  calcium, fish oil, Women's Nutrition and furosemide.   PAST MEDICAL HISTORY:  1. Migraines.  2. Vertigo.  3. History of cataracts status post bilateral surgery.  4. Hiatal hernia.  5. Non-insulin dependent diabetes mellitus.  6. History of arthritis.   PAST SURGICAL HISTORY:  1. Right knee replacement.  2. Cataract left eye.  3. Cataract right eye.  4. Partial hysterectomy.  5. Hernia repair.   FAMILY HISTORY:  Father deceased at age 58 with diabetes and history of  gangrene.  Mother deceased at age 20 with diabetes and pancreatic  cancer.  One brother deceased age 52 with pneumonia and diabetes.  Another brother deceased in his 8s with MI, prostate cancer and  aneurysm.  Another sister deceased with osteoporosis, history of stroke,  passed away about age 39 or  29.   SOCIAL HISTORY:  Married, retired.  Nonsmoker.  No alcohol.  Two  children.  Her husband will be assisting with her care after surgery.   REVIEW OF SYSTEMS:  GENERAL:  No fevers, chills or night sweats.  NEURO:  No seizures, syncope or paralysis.  RESPIRATORY: No shortness of breath,  productive cough or hemoptysis.  CARDIOVASCULAR:  No chest pain,  orthopnea.  GI:  No nausea, vomiting, diarrhea or constipation.  GU:  Little bit of nocturia.  No dysuria, hematuria.  MUSCULOSKELETAL: Left  knee.   PHYSICAL EXAMINATION:  VITAL SIGNS: Pulse 72, respirations 12, blood  pressure 138/70.  GENERAL:  A 77 year old white female, well-nourished, well-developed, no  acute distress.  She is alert, oriented and cooperative, pleasant.  Good  historian.  HEENT: Normocephalic, atraumatic.  Pupils are round and reactive.  Oropharynx clear.  EOMs intact.  NECK:  Supple.  CHEST: Clear.  HEART: Regular rate and rhythm without murmur.  ABDOMEN: Soft, nontender.  Bowel  sounds present.  RECTAL/BREASTS/GENITALIA:  Not done.  Not pertinent to present illness.  EXTREMITIES:  Left knee slight varus, marked crepitus, tender more  medial than lateral.  Range of motion 5-120.  No instability.   IMPRESSION:  Osteoarthritis left knee.   PLAN:  The patient will be admitted to [facility] to undergo a  left total knee replacement arthroplasty.  Surgery will be performed by  Dr. Gaynelle Arabian.      Alexzandrew L. Perkins, P.A.C.      Gaynelle Arabian, M.D.  Electronically Signed    ALP/MEDQ  D:  03/20/2008  T:  03/20/2008  Job:  [id number]   cc:   Shanna Cisco., MD  [address]  [place]  [place] [address]   Gaynelle Arabian, M.D.  Fax: [phone]

## 2010-11-20 NOTE — Discharge Summary (Signed)
[name], [name]               ACCOUNT NO.:  [id number]   MEDICAL RECORD NO.:  [id number]          PATIENT TYPE:  INP   LOCATION:  [place]                         FACILITY:  [facility]   PHYSICIAN:  Gaynelle Arabian, M.D.    DATE OF BIRTH:  [birth year]   DATE OF ADMISSION:  07/11/2008  DATE OF DISCHARGE:  07/15/2008                               DISCHARGE SUMMARY   ADMITTING DIAGNOSES:  1. Osteoarthritis, left knee.  2. Migraines.  3. Vertigo.  4. Cataracts.  5. Varicose veins.  6. Hiatal hernia.  7. Non-insulin-dependent diabetes mellitus.  8. Postmenopausal.   DISCHARGE DIAGNOSES:  1. Osteoarthritis, left knee, status post left total knee replacement      arthroplasty.  2. Acute postop blood loss anemia.  3. Sinus tachycardia.  4. Status post transfusion without sequelae.  5. Migraines.  6. Vertigo.  7. Cataracts.  8. Varicose veins.  9. Hiatal hernia.  10.Non-insulin-dependent diabetes mellitus.  11.Postmenopausal.   PROCEDURE:  July 11, 2008, left total knee.   SURGEON:  Dr. Wynelle Link.   ASSISTANT:  Arlee Muslim, P.A.-C.   Spinal anesthesia.   CONSULTS:  None.   BRIEF HISTORY:  Ms. Schwarting is a 77 year old female with end-stage  arthritis of the left knee, progressively worsening pain and  dysfunction, failed nonoperative management, now presents for a left  total knee arthroplasty.   LABORATORY DATA:  Preop CBC showed hemoglobin 12.0, hematocrit of 35.6,  white count 4.1, red cell count 3.88, and platelets 192.  Postop  hemoglobin 9.0, drifted down to 9, got as low as 8.1, given 2 units of  blood, post transfusion hemoglobin 10.5 and 30.4.  PT/PTT preop 13.3 and  27 respectively.  INR 1.0.  Serial pro times followed per Coumadin  protocol.  Last noted PT/INR 20.8 and 1.7.  Chem panel on admission all  within normal limits.  Serial BMETs were followed.  Electrolytes  remained within normal limits.  Preop UA negative.  Blood group type O+.  EKG, July 05, 2008,  normal sinus rhythm, possible left atrial  enlargement, low-voltage QRS, left anterior fascicular block, right  bundle branch block, no previous tracing, confirmed by Dr. Aldona Bar.   HOSPITAL COURSE:  Patient admitted to [facility], taken to the  OR, underwent above-stated procedure without complication.  Patient  tolerated procedure well and later transferred to recovery room and then  to the orthopedic floor.  Had a rough night on the evening of surgery  with pain, doing a little bit better on the morning of day 1.  Got  discharge planning involved since patient wanted to go home after the  hospital stay.  Patient had a little bit of fast pulse rate, 100 to 110,  felt to be due to a pain component.  Hemoglobin was down a little bit to  9.6 so we put her on iron supplement.  Started on sliding scale because  of her diabetes.  She did have a little bit of soft blood pressure with  systolic about [id number] to 123456.  She did have Duramorph under spinal so we  gave supplementation  fluids to help keep her pressure up.  Started  getting up out of bed on day 1.  By day 2, patient was still had some  tachycardia, it was felt it was due to pain component, possibly due to  low hemoglobin.  She did not want to do blood.  She was asymptomatic  with the exception of the fast heart rate.  She continued on the iron.  Her urine output which started out a little bit slow but picked up  greatly and had excellent urine output by day 2.  Dressing change and  incision looked good.  She was getting up and tolerating therapy,  walking about 70 feet or so.  Dressing changed on day 2, incision looked  good.  By day 3, unfortunately her pulse rate was still between 110 and  115 but her hemoglobin was much lower to 8.1.  It was felt the sinus  tachycardia was due to the blood loss.  Recommended that she take the  blood.  She was in agreement.  She had to get 2 units at that time.  She  felt much better after  the blood.  By day 4, hemoglobin was up to 10.5  and sinus tachycardia had improved.  Her pulse was back down to high 90s  around 100.  At this point, she had been weaned over to p.o. meds,  ambulating well, and was discharged home.   DISCHARGE PLAN:  1. Patient discharged home on July 15, 2008.  2. Discharge diagnoses:  Please see above.  3. Discharge meds:  Percocet, Robaxin, and Coumadin.   DIET:  Heart-healthy, diabetic diet.   ACTIVITIES:  Weightbearing as tolerated, left leg.  Total knee protocol.  Home health PT.  Home health nursing.   FOLLOWUP:  Two weeks.   DISPOSITION:  Home.   CONDITION UPON DISCHARGE:  Improved.      [name], P.A.C.      Gaynelle Arabian, M.D.  Electronically Signed    ALP/MEDQ  D:  08/30/2008  T:  08/30/2008  Job:  [id number]   cc:   Gaynelle Arabian, M.D.  Fax: [phone]   Shanna Cisco., MD  [address]  [place] [address]

## 2010-11-20 NOTE — H&P (Signed)
[name], [name]               ACCOUNT NO.:  [id number]   MEDICAL RECORD NO.:  [id number]          PATIENT TYPE:  AMB   LOCATION:  DAY                          FACILITY:  [facility]   PHYSICIAN:  Doran Heater. Veverly Fells, M.D. DATE OF BIRTH:  1929-12-09   DATE OF ADMISSION:  02/20/2009  DATE OF DISCHARGE:                              HISTORY & PHYSICAL   CHIEF COMPLAINTS:  Right shoulder pain.   HISTORY OF PRESENT ILLNESS:  The patient is a 77 year old female with  worsening right shoulder pain secondary to avascular necrosis.  The  patient has elected for right total shoulder arthroplasty versus  hemiarthroplasty by Dr. Esmond Plants.   PAST MEDICAL HISTORY:  Migraines, hyperlipidemia, and diabetes.   FAMILY MEDICAL HISTORY:  Pancreatic cancer.   SOCIAL HISTORY:  The patient of Dr. Gladys Damme.  Does not smoke or use  alcohol.   ALLERGIES:  None.   MEDICATIONS:  1. Glipizide 5 mg daily.  2. Lipitor 10 mg daily.  3. Nexium 40 mg daily.  4. Estradiol 1 mg daily.  5. Lasix 40 mg 3 tablets a week.  6. Diclofenac 75 mg b.i.d.  7. Aspirin 81 mg daily.  8. Colace 100 mg b.i.d. p.r.n.  9. Tylenol Arthritis p.r.n.   REVIEW OF SYSTEMS:  Pain with range of motion of the right upper  extremity.   PHYSICAL EXAMINATION:  VITAL SIGNS:  Pulse 80, respirations 61, and  blood pressure 128/82.  GENERAL:  The patient is a healthy-appearing 77 year old female in no  acute distress, pleasant mood and affect, alert and oriented x3.  NECK:  Full range of motion without any tenderness.  CHEST:  Active breath sounds bilaterally.  No wheezes, rhonchi, or  rales.  HEART:  Regular rate and rhythm.  No murmur.  ABDOMEN:  Nontender and nondistended.  EXTREMITIES:  Moderate tenderness of the bilateral shoulder.  She has  forward flexion of 90 degrees of external rotation and 20 degrees of  internal rotation.  ABDOMEN:  No rashes or edema noted.  NEUROLOGIC:  Otherwise, she is intact.   X-rays show right  shoulder avascular necrosis.   IMPRESSION:  Right shoulder avascular necrosis.   PLAN OF ACTION:  A right total shoulder arthroplasty or hemiarthroplasty  by Dr. Esmond Plants.      Thomas B. Doren Custard, P.A.      Doran Heater. Veverly Fells, M.D.  Electronically Signed    TBD/MEDQ  D:  02/08/2009  T:  02/08/2009  Job:  [id number]

## 2010-11-21 ENCOUNTER — Encounter: Payer: Self-pay | Admitting: Family Medicine

## 2010-11-21 ENCOUNTER — Ambulatory Visit (INDEPENDENT_AMBULATORY_CARE_PROVIDER_SITE_OTHER): Payer: Medicare Other | Admitting: Family Medicine

## 2010-11-21 DIAGNOSIS — E039 Hypothyroidism, unspecified: Secondary | ICD-10-CM

## 2010-11-21 DIAGNOSIS — Z780 Asymptomatic menopausal state: Secondary | ICD-10-CM

## 2010-11-21 DIAGNOSIS — E559 Vitamin D deficiency, unspecified: Secondary | ICD-10-CM

## 2010-11-21 DIAGNOSIS — E119 Type 2 diabetes mellitus without complications: Secondary | ICD-10-CM

## 2010-11-21 DIAGNOSIS — E785 Hyperlipidemia, unspecified: Secondary | ICD-10-CM

## 2010-11-21 DIAGNOSIS — D649 Anemia, unspecified: Secondary | ICD-10-CM

## 2010-11-21 DIAGNOSIS — R609 Edema, unspecified: Secondary | ICD-10-CM

## 2010-11-21 DIAGNOSIS — R3915 Urgency of urination: Secondary | ICD-10-CM

## 2010-11-21 DIAGNOSIS — I10 Essential (primary) hypertension: Secondary | ICD-10-CM

## 2010-11-21 LAB — POCT URINALYSIS DIPSTICK
Bilirubin, UA: NEGATIVE
Blood, UA: NEGATIVE
Glucose, UA: NEGATIVE
Ketones, UA: NEGATIVE
Leukocytes, UA: NEGATIVE
Nitrite, UA: NEGATIVE
Spec Grav, UA: 1.02
Urobilinogen, UA: 0.2
pH, UA: 7

## 2010-11-21 LAB — LIPID PANEL
Cholesterol: 172 mg/dL (ref 0–200)
HDL: 44.3 mg/dL (ref >39.00)
Total CHOL/HDL Ratio: 4
Triglycerides: 280 mg/dL — ABNORMAL HIGH (ref 0.0–149.0)
VLDL: 56 mg/dL — ABNORMAL HIGH (ref 0.0–40.0)

## 2010-11-21 LAB — CBC WITH DIFFERENTIAL/PLATELET
Basophils Absolute: 0 10*3/uL (ref 0.0–0.1)
Basophils Relative: 0.9 % (ref 0.0–3.0)
Eosinophils Absolute: 0.2 10*3/uL (ref 0.0–0.7)
Eosinophils Relative: 4.3 % (ref 0.0–5.0)
HCT: 36.4 % (ref 36.0–46.0)
Hemoglobin: 12.5 g/dL (ref 12.0–15.0)
Lymphocytes Relative: 39 % (ref 12.0–46.0)
Lymphs Abs: 1.6 10*3/uL (ref 0.7–4.0)
MCHC: 34.4 g/dL (ref 30.0–36.0)
MCV: 90.2 fl (ref 78.0–100.0)
Monocytes Absolute: 0.4 10*3/uL (ref 0.1–1.0)
Monocytes Relative: 8.9 % (ref 3.0–12.0)
Neutro Abs: 2 10*3/uL (ref 1.4–7.7)
Neutrophils Relative %: 46.9 % (ref 43.0–77.0)
Platelets: 206 10*3/uL (ref 150.0–400.0)
RBC: 4.04 Mil/uL (ref 3.87–5.11)
RDW: 13.7 % (ref 11.5–14.6)
WBC: 4.2 10*3/uL — ABNORMAL LOW (ref 4.5–10.5)

## 2010-11-21 LAB — BASIC METABOLIC PANEL
BUN: 18 mg/dL (ref 6–23)
CO2: 30 mEq/L (ref 19–32)
Calcium: 9.2 mg/dL (ref 8.4–10.5)
Chloride: 105 mEq/L (ref 96–112)
Creatinine, Ser: 1.2 mg/dL (ref 0.4–1.2)
GFR: 45.22 mL/min — ABNORMAL LOW (ref >60.00)
Glucose, Bld: 111 mg/dL — ABNORMAL HIGH (ref 70–99)
Potassium: 4.3 mEq/L (ref 3.5–5.1)
Sodium: 142 mEq/L (ref 135–145)

## 2010-11-21 LAB — HEPATIC FUNCTION PANEL
ALT: 16 U/L (ref 0–35)
AST: 17 U/L (ref 0–37)
Albumin: 3.6 g/dL (ref 3.5–5.2)
Alkaline Phosphatase: 60 U/L (ref 39–117)
Bilirubin, Direct: 0.1 mg/dL (ref 0.0–0.3)
Total Bilirubin: 0.5 mg/dL (ref 0.3–1.2)
Total Protein: 6.4 g/dL (ref 6.0–8.3)

## 2010-11-21 LAB — LDL CHOLESTEROL, DIRECT: Direct LDL: 94.8 mg/dL

## 2010-11-21 LAB — HEMOGLOBIN A1C: Hgb A1c MFr Bld: 6.6 % — ABNORMAL HIGH (ref 4.6–6.5)

## 2010-11-21 LAB — TSH: TSH: 2.82 u[IU]/mL (ref 0.35–5.50)

## 2010-11-21 MED ORDER — ATORVASTATIN CALCIUM 10 MG PO TABS: ORAL_TABLET | ORAL | Status: DC

## 2010-11-21 MED ORDER — ESTRADIOL 1 MG PO TABS: 0.5000 mg | ORAL_TABLET | Freq: Every day | ORAL | Status: DC

## 2010-11-21 MED ORDER — FUROSEMIDE 40 MG PO TABS: 40.0000 mg | ORAL_TABLET | Freq: Every day | ORAL | Status: DC

## 2010-11-21 MED ORDER — GLIPIZIDE ER 5 MG PO TB24: 5.0000 mg | ORAL_TABLET | Freq: Every day | ORAL | Status: DC

## 2010-11-21 MED ORDER — ESOMEPRAZOLE MAGNESIUM 40 MG PO CPDR: 40.0000 mg | DELAYED_RELEASE_CAPSULE | Freq: Every day | ORAL | Status: DC

## 2010-11-21 MED ORDER — GLUCOSE BLOOD VI STRP: ORAL_STRIP | Status: DC

## 2010-11-21 MED ORDER — ONETOUCH ULTRASOFT LANCETS MISC: Status: DC

## 2010-11-22 LAB — VITAMIN D 25 HYDROXY (VIT D DEFICIENCY, FRACTURES): Vit D, 25-Hydroxy: 42 ng/mL (ref 30–89)

## 2010-11-23 ENCOUNTER — Encounter: Payer: Self-pay | Admitting: Family Medicine

## 2010-11-23 NOTE — Op Note (Signed)
   [name], [name]                           ACCOUNT NO.:  [id number]   MEDICAL RECORD NO.:  [id number]                   PATIENT TYPE:  OIB   LOCATION:  2876                                 FACILITY:  [facility]   PHYSICIAN:  Gaynelle Arabian, M.D.                 DATE OF BIRTH:  04/06/1930   DATE OF PROCEDURE:  08/11/2002  DATE OF DISCHARGE:                                 OPERATIVE REPORT   PREOPERATIVE DIAGNOSIS:  Arthrofibrosis of right total knee.   POSTOPERATIVE DIAGNOSIS:  Arthrofibrosis of right total knee.   OPERATION PERFORMED:  Closed manipulation, right knee.   SURGEON:  Gaynelle Arabian, M.D.   ASSISTANT:  None.   ANESTHESIA:  General.   COMPLICATIONS:  None.   CONDITION:  Stable to recovery.   INDICATIONS FOR PROCEDURE:  The patient is a 77 year old female who had a  right total knee arthroplasty done approximately seven weeks ago and has had  difficulty obtaining her range of motion.  She has had physical therapy  without benefit.  She has pain and stiffness with a range only from 0 to 60.  She presents now for closed manipulation.   DESCRIPTION OF PROCEDURE:  After successful administration of general  anesthetic with gentle manipulation I put my chest onto her proximal tibia  and gently flexed the knee.  You could audibly hear the adhesions lysing.  I  was able to get her flexed easily to 120 degrees.  Her flexing against  gravity was about 100 and then I could get her down to 120 to 125.  Her  patella mobility improved dramatically.  Once the manipulation was  completed, she was awakened and transported to recovery in stable condition.                                                Gaynelle Arabian, M.D.    FA/MEDQ  D:  08/11/2002  T:  08/11/2002  Job:  [id number]

## 2010-11-23 NOTE — H&P (Signed)
[name], [name]                           ACCOUNT NO.:  [id number]   MEDICAL RECORD NO.:  [id number]                   PATIENT TYPE:  INP   LOCATION:  [id number]                                 FACILITY:  [facility]   PHYSICIAN:  Gaynelle Arabian, M.D.                 DATE OF BIRTH:  05/23/1930   DATE OF ADMISSION:  06/23/2002  DATE OF DISCHARGE:                                HISTORY & PHYSICAL   DATE OF OFFICE VISIT HISTORY AND PHYSICAL:  June 11, 2002.   CHIEF COMPLAINT:  Right knee pain.   HISTORY OF PRESENT ILLNESS:  The patient is a 77 year old female who has a  longstanding history of bilateral knee pain and low back pain.  She has been  seen and evaluated by Dr. Wynelle Link for bilateral knee pain.  The right knee  is more symptomatic than the left.  The knees have been progressive over the  past year.  However, she has had problems over the past 10 years.  She has  tried medications and is currently on Vioxx without benefit.  She is  experiencing pain throughout the day and night.  It started to interfere  with her daily activities including cleaning her own home.  She was seen in  the office where x-rays reveal severe and significant joint space narrowing  with bone-on-bone contact in the right knee moreso than the left.  It is  felt she has reached a point where she could benefit from undergoing knee  replacement.  The risks and benefits were discussed.  The patient has  elected to proceed with surgery.   ALLERGIES:  No known drug allergies.   CURRENT MEDICATIONS:  1. Lipitor 10 mg daily.  2. Vioxx 25 mg daily.  3. Prevacid 30 mg daily.  4. Estradiol 2 mg daily.  5. Vitamin E 400 IU daily.  6. Vitamin C 500 mg daily.  7. Calcium 600 mg twice a day.  8. Valium 5 mg 1/2 tablet p.o. p.r.n.   PAST MEDICAL HISTORY:  1. Migraines.  2. Hiatal hernia.  3. Elevated cholesterol.   PAST SURGICAL HISTORY:  1. Umbilical hernia in 123XX123.  2. Partial hysterectomy in 1975.  3.  Right carpal tunnel release in 1977.  4. Left eye cataract surgery in 1996.  5. Right eye cataract surgery in 1997.  6. Right hand ganglion cyst surgery in June 2003.   SOCIAL HISTORY:  The patient is married.  Retired from [employer].  She worked there for 40 years.  Nonsmoker, no alcohol.  Has two  children.  One-story home.   FAMILY HISTORY:  Brother deceased with an aneurysm.  She has a sister who  has had a stroke.  Mother deceased with pancreatic cancer.  Heart disease  and diabetes run in the family.   REVIEW OF SYSTEMS:  GENERAL:  No fevers, chills, or night sweats.  NEUROLOGIC:  No seizures, syncope, or paralysis.  RESPIRATORY:  No shortness  of breath, productive cough, or hemoptysis.  CARDIOVASCULAR:  No chest pain,  angina, or orthopnea.  GASTROINTESTINAL:  No nausea, vomiting, diarrhea, or  constipation.  GENITOURINARY:  No dysuria or hematuria.  She does have some  urinary frequency, with some nocturia.  MUSCULOSKELETAL:  Pertinent to that  of the right knee found in the history of present illness.   PHYSICAL EXAMINATION:  VITAL SIGNS:  Pulse 80, respirations 16, blood  pressure 148/76.  GENERAL:  The patient is a 77 year old female.  Well-nourished, well-  developed.  Appears in no acute distress.  She is alert, oriented,  cooperative.  HEENT:  Normocephalic, atraumatic.  Pupils round and reactive.  Oropharynx  is clear.  EOMs are intact.  The patient does have upper denture plate with  a partial lower plate.  NECK:  Supple.  CHEST:  Clear to auscultation anterior and posterior chest walls.  HEART:  Regular rate and rhythm.  S1, S2 noted.  No rubs, thrills,  palpitations.  ABDOMEN:  Soft and nontender.  Bowel sounds are present.  RECTAL, BREASTS, GENITALIA:  Not done.  Not pertinent to present illness.  EXTREMITIES:  Limited to that of the right lower extremity.  The right knee  shows range of motion of 0-110 degrees.  There is no instability.   Marked  crepitus noted.  Motor function is intact.   IMPRESSION:  1. Osteoarthritis bilateral knees, right more symptomatic than left.  2. History of migraines.  3. Hiatal hernia.  4. Hypercholesterolemia.   PLAN:  The patient will be admitted to [facility] to undergo a  right total knee replacement arthroplasty.  Surgery will be performed by Dr.  Gaynelle Arabian.     Alexzandrew L. Dara Lords, P.A.              Gaynelle Arabian, M.D.    ALP/MEDQ  D:  06/24/2002  T:  06/25/2002  Job:  [id number]

## 2010-11-23 NOTE — Op Note (Signed)
NAMEJAHNELLE, HULLUM                           ACCOUNT NO.:  [id number]   MEDICAL RECORD NO.:  [id number]                   PATIENT TYPE:  INP   LOCATION:  0011                                 FACILITY:  [facility]   PHYSICIAN:  Gaynelle Arabian, M.D.                 DATE OF BIRTH:  1929-07-19   DATE OF PROCEDURE:  06/23/2002  DATE OF DISCHARGE:                                 OPERATIVE REPORT   PREOPERATIVE DIAGNOSES:  Osteoarthritis, right knee.   POSTOPERATIVE DIAGNOSES:  Osteoarthritis, right knee.   PROCEDURE:  Right total knee arthroplasty.   SURGEON:  Gaynelle Arabian, M.D.   ASSISTANT:  Arlee Muslim, P.A.-C.   ANESTHESIA:  Spinal.   ESTIMATED BLOOD LOSS:  Minimal.   DRAINS:  Hemovac x1.   COMPLICATIONS:  None.   TOURNIQUET TIME:  45 minutes at 300 mmHg.   CONDITION:  Stable to recovery.   BRIEF CLINICAL NOTE:  [name] is a 77 year old female with significant  osteoarthritis of the right greater than left knee with pain refractory to  nonoperative management. She presents now for elective right total knee  arthroplasty.   DESCRIPTION OF PROCEDURE:  After successful administration of spinal  anesthetic, a tourniquet was placed high on the right thigh and right lower  extremity prepped and draped in the usual sterile fashion. Extremities  wrapped in Esmarch, knee flexed, tourniquet inflated to 300 mmHg. A standard  midline incision was made with a 10 blade through subcutaneous tissue to the  level of the extensor mechanism. A fresh blade was used to make a medial  parapatellar arthrotomy and then the soft tissue over the proximal medial  tibia subperiosteally elevated to the joint line with a knife into the  semimembranosus bursa with a curved osteotome. The soft tissue over the  proximal lateral tibia was also elevated with attention being paid to  avoiding the patella tendon on the tibial tubercle. Lateral patellofemoral  ligament was cut, patella everted, knee  flexed to 90 degrees, ACL and PCL  removed. A drill was used to create a starting hole in the distal femur,  canal was irrigated and a 5 degree right valgus alignment guide placed.  Referencing off the posterior condyles, rotations marked and a block pinned  to remove 9 mm off the distal femur. Distal femoral resection was made with  an oscillating saw. A size three is the most appropriate femoral component.  Rotations marked off the epicondylar axis and a size 3 cutting block pinned  and the anterior and posterior cuts made.   Tibia subluxed forward and the menisci removed. The extramedullary tibial  alignment guide is placed referencing proximally at the medial aspect of the  tibial tubercle and distally along the second metatarsal axis and tibial  crest. The block was pinned to remove 10 mm off the nondeficient lateral  side. Tibial resection was made with an oscillating saw. A  size 2.5 was the  most appropriate tibial size and the tibial baseplate is placed with 2.5 and  the proximal tibia prepared with the modular drill and keel punch.   The femur is then completed with the intercondylar and chamfer cuts. A size  3 posterior stabilized femoral trial and size 2.5 mobile bearing tibial tray  trial and a 10 mm posterior stabilized rotating platform insert trial are  placed. Full extension is achieved with excellent varus and valgus balance  throughout full range of motion. The trials are all removed and then the  patella everted and thickness measured to be 21 mm. Free hand resection was  taken down to 12 mm. A 35 template placed, lug holes drilled, trial patella  placed and tracks normally. Please note that the trials were still in place  at this point. The trials are now removed and posterior femur is visualized  and there were no osteophytes present with the trial on. So that trial was  subsequently removed and cut bone surfaces prepared with pulsatile lavage.  Cement is mixed and  once ready for implantation, a 2.5 mobile tibial tray,  size 3 right posterior stabilized femur and the 35 patella is cemented into  place. The patella is held with a patella clamp. The trial 10 mm posterior  stabilized rotating platform insert trial is placed and the knee held in  full extension, all extruded cement removed. Once the cement is fully  hardened and the permanent 10 mm size 3 rotating platform posterior  stabilized insert was placed into the tibial tray. Again full extension was  achieved with excellent varus and valgus balance throughout full range of  motion. The wound was copiously irrigated with antibiotic solution and the  extensor mechanism closed over a Hemovac drain with interrupted #1 PDS. The  tourniquet was released with a total tourniquet time of 45 minutes, minor  bleeding stopped with cautery. Flexion against gravity was 135 degrees. The  subcu was closed with interrupted 2-0 Vicryl, subcuticular with running 4-0  Monocryl. The incision was cleaned and dried and Steri-Strips and a bulky  sterile dressing applied. The drain was hooked to suction, knee immobilizer  placed and she was awakened and transported to the recovery room in stable  condition.                                               Gaynelle Arabian, M.D.    FA/MEDQ  D:  06/23/2002  T:  06/23/2002  Job:  [id number]

## 2010-11-23 NOTE — Discharge Summary (Signed)
[name], [name]                           ACCOUNT NO.:  [id number]   MEDICAL RECORD NO.:  [id number]                   PATIENT TYPE:  INP   LOCATION:  [id number]                                 FACILITY:  [facility]   PHYSICIAN:  Gaynelle Arabian, M.D.                 DATE OF BIRTH:  [birth year]   DATE OF ADMISSION:  06/23/2002  DATE OF DISCHARGE:  06/29/2002                                 DISCHARGE SUMMARY   ADMISSION DIAGNOSES:  1. Osteoarthritis bilateral knees, right more symptomatic than left.  2. History of migraines.  3. Hiatal hernia.  4. Hypercholesterolemia.   DISCHARGE DIAGNOSES:  1. Osteoarthritis right knee, status post right total knee replacement     arthroplasty.  2. Osteoarthritis left knee.  3. Postoperative hyponatremia, improved.  4. History of migraines.  5. Hiatal hernia.  6. Hypercholesterolemia.   PROCEDURE:  The patient was taken to the operating room on June 23, 2002, and underwent a right total knee replacement arthroplasty.  Surgeon  was Dr. Pilar Plate Aluisio.  Assistant was [employer], P.A.C.  Surgery under  spinal anesthesia.  Estimated blood loss minimal.  Hemovac drain x 1.  Tourniquet time 45 minutes at 300 mmHg.   CONSULTATIONS:  Rehabilitation services.   BRIEF HISTORY:  The patient is a 77 year old female with a longstanding  history of bilateral knee pain and low back pain.  She has been evaluated by  Dr. Wynelle Link for ongoing knee pain.  The right is more symptomatic and  problematic than the left.  The knee has been progressive over the past  several years.  She was seen in the office with significant joint space  narrowing and bone-on-bone contact of the right knee moreso than the left.  It was felt she has reached a point where she could benefit from undergoing  surgery.  The risks and benefits were discussed.  The patient was  subsequently admitted to the hospital.   LABORATORY DATA:  CBC on admission showed a hemoglobin 12.9,  hematocrit  36.6, white cell count 4.5, red cell count 4.12, differential within normal  limits.  Postoperative H&H 11.1 and 32.3.  Last noted H&H 10.9 and 31.0.  PT  and PTT on admission were 13.0 and 28 respectively with an INR of 0.9.  Serial protimes were followed per Coumadin protocol.  Last noted PT and INR  21.8 and 2.2.  Chemistry panel on admission all within normal limits.  Follow-up BMET showed a drop in sodium to 129 and then further down the next  day to 127.  Sodium was back up to 134 prior to discharge.  Glucose went up  from 97 to 193 and was back down to 158.  Calcium dropped from 9.8 to 7.8  and was back up to 8.9.  Urinalysis on admission was negative.  Follow-up UA  on June 29, 2002, was negative.  Follow-up UA on June 27, 2002, only  showed trace ketones, rare epithelial cells, and 0-2 white cells, and  positive protein, positive glucose.  Blood group type O positive.  Urine  culture negative x 2.   EKG dated June 18, 2002:  Normal sinus rhythm.  Left axis deviation.  No  old tracings to compare.  Confirmed by Dr. Peter Martinique.  Follow-up EKG  June 27, 2002:  Sinus tachycardia.  Left axis deviation.  Abnormal EKG,  unconfirmed.   Chest x-ray dated June 18, 2002:  No active disease.   HOSPITAL COURSE:  The patient was admitted to [facility], taken to  the OR, underwent the above-stated procedure without complications.  The  patient tolerated the procedure well and was later transferred to the  recovery room and then to the orthopedic floor for continued postoperative  care.  The patient was given 24 hours of postoperative antibiotics, placed  on Coumadin for DVT prophylaxis.  PCA medications were used for  postoperative pain control.  Hemovac drain placed at the time of surgery was  pulled on postoperative day #1.  The patient was noted to have a drop in  sodium and potassium on postoperative day #1.  Fluids were adjusted.  She  was  also noted to have an elevated glucose of 193.  Dextrose was removed.  Glucose did improve.  Her sodium and potassium again dropped a little lower  by postoperative day #2.  She was placed on fluid restrictions.  Dressing  changes were initiated on postoperative day #2.  Incision was healing well.  A rehabilitation consult was called for consideration of inpatient  rehabilitation.  It was felt the patient may benefit from a short stay in  inpatient rehabilitation versus home, depending on her progress.  By day #3  the sodium and potassium did improve.  However, she developed some urinary  frequency.  A UA and culture were taken.  UA and culture did prove to be  negative for urinary tract infection.  Incision continued to heal well.  She  was seen by physical therapy and occupational therapy for gait training,  ambulation, and ADLs.  She was up to 80 feet by postoperative day #2 and  continued to progress up to 160 feet by postoperative day #4.  It was felt  that she was doing so well that she would not require inpatient  rehabilitation.  Therefore, discharge planning assisted with her care for  home health.  By June 28, 2002, the patient was doing much better with  her therapy, however, had not had a bowel movement.  Continued to work on  her mobility and also her bowel program.  By the following day of June 29, 2002, the patient was doing much better, had started moving her bowels.  She was tolerating her p.o. medications.  The wound looked excellent, and it  was decided she could be discharged home.   DISCHARGE PLAN:  1. The patient was discharged home on June 29, 2002.  2. Discharge diagnoses:  Please see above.  3. Discharge medications:  Coumadin for DVT prophylaxis.  Restoril p.r.n.     sleep.  Robaxin p.r.n. spasm.  Percocet p.r.n. pain.  4. Diet:  Low-cholesterol diet. 5. Activity:  Full weightbearing.  Home health PT and home health nursing     through [facility].  Total knee protocol.  6. Follow-up:  In 2 to 2-1/2 weeks from surgery.  Call the office for an  appointment.    DISPOSITION:  Home.   CONDITION ON DISCHARGE:  Improved.     Alexzandrew L. Dara Lords, P.A.              Gaynelle Arabian, M.D.    ALP/MEDQ  D:  07/28/2002  T:  07/28/2002  Job:  [id number]   cc:   Rehabilitation Services   Shanna Cisco., M.D.  [address] [place]  [place] [address]  Fax: [phone]

## 2010-11-23 NOTE — Op Note (Signed)
[name], [name]                      ACCOUNT NO.:  [id number]   MEDICAL RECORD NO.:  [id number]                   PATIENT TYPE:  AMB   LOCATION:  DAY                                  FACILITY:  [facility]   PHYSICIAN:  Gaynelle Arabian, M.D.                 DATE OF BIRTH:  10-Dec-1929   DATE OF PROCEDURE:  12/14/2002  DATE OF DISCHARGE:                                 OPERATIVE REPORT   PREOPERATIVE DIAGNOSIS:  Right knee arthrofibrosis.   POSTOPERATIVE DIAGNOSIS:  Right knee arthrofibrosis.   PROCEDURE:  Right knee arthroscopy with lysis of adhesions.   SURGEON:  Gaynelle Arabian, M.D.   ASSISTANT:  None.   ANESTHESIA:  Epidural.   ESTIMATED BLOOD LOSS:  Minimal.   DRAINS:  Hemovac x1.   COMPLICATIONS:  None. Condition stable to recovery.   INDICATIONS FOR PROCEDURE:  [name] is a 77 year old female  who had a  right  total knee arthroplasty done in December of 2003. She had  postoperative stiffness and  underwent closed manipulation in approximately  February. Post manipulation we able to flex her close to 120 degrees. She  never regained that amount of flexion. She was achieving close to 120  degrees in physical therapy passively and never actively flexed greater than  85 or 90. She has had lateral pain and she has very poor patellar mobility.  X-rays have looked fine all along. She presents now for arthroscopy,  lysis  of adhesions and associated manipulation.   DESCRIPTION OF PROCEDURE:  After successful administration of epidural  anesthesia, exam under anesthesia was performed and range of motion is 0 to  95 passively. We then prepped and draped the right lower extremity in the  usual sterile fashion. A standard superomedial inferolateral incision was  made for the arthroscopy. The inferior  cannula was passed superomedial and  the camera passed inferolateral. I describe the visualization proceeds.   The were some adhesions in the suprapatellar area. I used a  spinal needle to  localized the inferomedial portal. A small  incision was made and then a  shaver was placed. We debrided all of those adhesions with the shaver. The  majority of the adhesions were in the medial and lateral gutter and those  were also debrided with the shaver. She also had adhesions in the fat pad  and those were debrided. I then checked patellar mobility and  it was  excellent.   We removed the arthroscopic equipment and function against gravity was now  105 degrees and I could easily get her flexed to approximately 115 without  any difficulty. We then replaced the inflow cannula and passed the Hemovac  drain in through that portal. The inflow cannula was removed and all the  portal sites were then closed with interrupted 4-0 nylon. The drain was not  sewn in.   Once again her patellar mobility was back to normal. I could  easily flex her  to 115. Once the wounds were closed a bulky sterile dressing was applied.  She was subsequently taken to the recovery room in stable condition. She  will be on a continuous postoperative epidural anesthetic and will remain in  CPM.                                               Gaynelle Arabian, M.D.    FA/MEDQ  D:  12/14/2002  T:  12/14/2002  Job:  [id number]

## 2010-11-23 NOTE — Progress Notes (Signed)
  Subjective:    Patient ID: [name], female    DOB: 1930-05-08, 77 y.o.   MRN: [id number] This 77 year old white married female is in to discuss her multiple medical problems refill her medications and get necessary lab studies she relates that her breast that had mastitis is doing fine with no pain. She continues to have 1+ pretibial edema but is controlled in general with diuretics she did describe low back pain especially after sleeping but then after moving or walking the pain is decreased. Her CBGs 1:15 to 120 joint or bone 5 she continues to weigh herself so the sign and no weight loss colonoscopic exam due to 2013 immunizations are up-to-date HPI    Review of Systems see history of present illness     Objective:   Physical Exam This patient is a white female who is slightly overweight but in no distress HEENT negative carotids good thyroid nonpalpable Heart examination reveals no cardiomegaly heart sounds are good without murmurs Lungs clear to palpation percussion and auscultation no dullness no rales no wheezing Breasts full no masses palpable slight cystic area in the right lower quadrant of the right breast nipples were normal axilla clear no lymphadenopathy Abdomen liver spleen and kidneys are nonpalpable no masses felt bowel sounds normal Pelvic examination not done had Pap smear one year ago Extremities 1+ pretibial edema joints appear normal Well-healed operative scar over the left knee from knee replacement Neurological exam no abnormalities       Assessment & Plan:  Fetal patient is doing her well and should continue on her present medications Diabetes mellitus well-controlled Arthritis well controlled Postmenopausal syndrome controlled with Estrace GERD controlled with Nexium

## 2010-11-23 NOTE — Op Note (Signed)
[place]  Patient:    [name], [name] Visit Number: [id number] MRN: [id number]          Service Type: DSU Location: [facility] Attending Physician:  Carlisle Cater Dictated by:   Daryl Eastern, M.D. Proc. Date: 12/09/01 Admit Date:  12/09/2001   CC:         Scharlene Gloss., M.D.   Operative Report  PREOPERATIVE DIAGNOSIS:  Mass, right index finger.  POSTOPERATIVE DIAGNOSIS:  Mass, right index finger.  OPERATION PERFORMED:  Excision of mass, right index finger in the palm.  SURGEON:  Daryl Eastern, M.D.  ANESTHESIA:  0.5% Marcaine local with sedation.  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  DESCRIPTION OF PROCEDURE:  Under 0.5% Marcaine local anesthesia with a tourniquet on the right arm, the right hand was prepped and draped in the usual sterile fashion.  After exsanguinating the limb, the tourniquet was inflated to 250 mHg.  A V-shaped incision was made just distal to the proximal palmar crease in line with the ring finger.  Blunt dissection was carried through the subcutaneous tissues down to the mass.  It was completely excised and appeared to arise from the tendon sheath.  The tendon sheath was partially opened and no triggering was identified or inflammation around the tendon was not noted.  The wound was irrigated with saline.  The skin was closed with 4-0 nylon sutures.  Sterile dressings were applied.  The patient tolerated the procedure well.  The tourniquet was released with good circulation to the hand.  She went to the recovery room awake and stable in good condition. Dictated by:   Daryl Eastern, M.D. Attending Physician:  Carlisle Cater DD:  12/09/01 TD:  12/10/01 Job: [id number]

## 2010-12-04 NOTE — Progress Notes (Signed)
Quick Note:  Pt is aware. ______ 

## 2010-12-06 ENCOUNTER — Other Ambulatory Visit: Payer: Medicare Other

## 2010-12-06 DIAGNOSIS — D649 Anemia, unspecified: Secondary | ICD-10-CM

## 2011-04-10 LAB — COMPREHENSIVE METABOLIC PANEL
ALT: 18
AST: 21
Albumin: 3.7
Alkaline Phosphatase: 55
BUN: 24 — ABNORMAL HIGH
CO2: 26
Calcium: 9.6
Chloride: 107
Creatinine, Ser: 1.3 — ABNORMAL HIGH
GFR calc Af Amer: 48 — ABNORMAL LOW
GFR calc non Af Amer: 40 — ABNORMAL LOW
Glucose, Bld: 114 — ABNORMAL HIGH
Potassium: 4.1
Sodium: 140
Total Bilirubin: 0.7
Total Protein: 6.8

## 2011-04-10 LAB — PROTIME-INR
INR: 1
Prothrombin Time: 13.1

## 2011-04-10 LAB — CBC
HCT: 35.6 — ABNORMAL LOW
Hemoglobin: 12.2
MCHC: 34.3
MCV: 91.7
Platelets: 207
RBC: 3.88
RDW: 12.7
WBC: 4.2

## 2011-04-10 LAB — URINALYSIS, ROUTINE W REFLEX MICROSCOPIC
Bilirubin Urine: NEGATIVE
Glucose, UA: NEGATIVE
Hgb urine dipstick: NEGATIVE
Ketones, ur: NEGATIVE
Nitrite: NEGATIVE
Protein, ur: NEGATIVE
Specific Gravity, Urine: 1.026
Urobilinogen, UA: 0.2
pH: 5

## 2011-04-10 LAB — APTT: aPTT: 27

## 2011-04-11 LAB — URINALYSIS, ROUTINE W REFLEX MICROSCOPIC
Bilirubin Urine: NEGATIVE
Glucose, UA: NEGATIVE mg/dL
Hgb urine dipstick: NEGATIVE
Ketones, ur: NEGATIVE mg/dL
Nitrite: NEGATIVE
Protein, ur: NEGATIVE mg/dL
Specific Gravity, Urine: 1.024 (ref 1.005–1.030)
Urobilinogen, UA: 0.2 mg/dL (ref 0.0–1.0)
pH: 5.5 (ref 5.0–8.0)

## 2011-04-11 LAB — CBC
HCT: 35.6 % — ABNORMAL LOW (ref 36.0–46.0)
Hemoglobin: 12 g/dL (ref 12.0–15.0)
MCHC: 33.8 g/dL (ref 30.0–36.0)
MCV: 91.8 fL (ref 78.0–100.0)
Platelets: 192 10*3/uL (ref 150–400)
RBC: 3.88 MIL/uL (ref 3.87–5.11)
RDW: 12.7 % (ref 11.5–15.5)
WBC: 4.1 10*3/uL (ref 4.0–10.5)

## 2011-04-11 LAB — COMPREHENSIVE METABOLIC PANEL
ALT: 17 U/L (ref 0–35)
AST: 16 U/L (ref 0–37)
Albumin: 3.5 g/dL (ref 3.5–5.2)
Alkaline Phosphatase: 54 U/L (ref 39–117)
BUN: 22 mg/dL (ref 6–23)
CO2: 28 mEq/L (ref 19–32)
Calcium: 9.2 mg/dL (ref 8.4–10.5)
Chloride: 105 mEq/L (ref 96–112)
Creatinine, Ser: 1.09 mg/dL (ref 0.4–1.2)
GFR calc Af Amer: 59 mL/min — ABNORMAL LOW (ref >60)
GFR calc non Af Amer: 49 mL/min — ABNORMAL LOW (ref >60)
Glucose, Bld: 103 mg/dL — ABNORMAL HIGH (ref 70–99)
Potassium: 4.2 mEq/L (ref 3.5–5.1)
Sodium: 141 mEq/L (ref 135–145)
Total Bilirubin: 0.5 mg/dL (ref 0.3–1.2)
Total Protein: 6.8 g/dL (ref 6.0–8.3)

## 2011-04-11 LAB — PROTIME-INR
INR: 1 (ref 0.00–1.49)
Prothrombin Time: 13.3 seconds (ref 11.6–15.2)

## 2011-04-11 LAB — APTT: aPTT: 27 seconds (ref 24–37)

## 2011-05-07 ENCOUNTER — Ambulatory Visit: Payer: 59

## 2011-05-08 ENCOUNTER — Ambulatory Visit (INDEPENDENT_AMBULATORY_CARE_PROVIDER_SITE_OTHER): Payer: Medicare Other

## 2011-05-08 DIAGNOSIS — Z23 Encounter for immunization: Secondary | ICD-10-CM

## 2011-06-26 ENCOUNTER — Encounter: Payer: Self-pay | Admitting: Internal Medicine

## 2011-08-22 DIAGNOSIS — H16149 Punctate keratitis, unspecified eye: Secondary | ICD-10-CM | POA: Diagnosis not present

## 2011-08-22 DIAGNOSIS — H1045 Other chronic allergic conjunctivitis: Secondary | ICD-10-CM | POA: Diagnosis not present

## 2011-08-22 DIAGNOSIS — H04129 Dry eye syndrome of unspecified lacrimal gland: Secondary | ICD-10-CM | POA: Diagnosis not present

## 2011-08-22 DIAGNOSIS — H16429 Pannus (corneal), unspecified eye: Secondary | ICD-10-CM | POA: Diagnosis not present

## 2011-09-09 DIAGNOSIS — H1045 Other chronic allergic conjunctivitis: Secondary | ICD-10-CM | POA: Diagnosis not present

## 2011-09-09 DIAGNOSIS — H5789 Other specified disorders of eye and adnexa: Secondary | ICD-10-CM | POA: Diagnosis not present

## 2011-09-09 DIAGNOSIS — H04129 Dry eye syndrome of unspecified lacrimal gland: Secondary | ICD-10-CM | POA: Diagnosis not present

## 2011-09-09 DIAGNOSIS — H16149 Punctate keratitis, unspecified eye: Secondary | ICD-10-CM | POA: Diagnosis not present

## 2011-09-16 DIAGNOSIS — Z961 Presence of intraocular lens: Secondary | ICD-10-CM | POA: Diagnosis not present

## 2011-09-16 DIAGNOSIS — H16229 Keratoconjunctivitis sicca, not specified as Sjogren's, unspecified eye: Secondary | ICD-10-CM | POA: Diagnosis not present

## 2011-09-16 DIAGNOSIS — H16129 Filamentary keratitis, unspecified eye: Secondary | ICD-10-CM | POA: Diagnosis not present

## 2011-09-16 DIAGNOSIS — H1045 Other chronic allergic conjunctivitis: Secondary | ICD-10-CM | POA: Diagnosis not present

## 2011-10-23 DIAGNOSIS — H5710 Ocular pain, unspecified eye: Secondary | ICD-10-CM | POA: Diagnosis not present

## 2011-10-23 DIAGNOSIS — Z961 Presence of intraocular lens: Secondary | ICD-10-CM | POA: Diagnosis not present

## 2011-10-23 DIAGNOSIS — H04129 Dry eye syndrome of unspecified lacrimal gland: Secondary | ICD-10-CM | POA: Diagnosis not present

## 2011-10-23 DIAGNOSIS — H40019 Open angle with borderline findings, low risk, unspecified eye: Secondary | ICD-10-CM | POA: Diagnosis not present

## 2011-10-23 DIAGNOSIS — E119 Type 2 diabetes mellitus without complications: Secondary | ICD-10-CM | POA: Diagnosis not present

## 2011-10-31 ENCOUNTER — Encounter: Payer: Self-pay | Admitting: Internal Medicine

## 2011-11-06 ENCOUNTER — Encounter: Payer: Self-pay | Admitting: Internal Medicine

## 2011-11-25 ENCOUNTER — Ambulatory Visit (AMBULATORY_SURGERY_CENTER): Payer: Medicare Other | Admitting: *Deleted

## 2011-11-25 VITALS — Ht 62.0 in | Wt 163.0 lb

## 2011-11-25 DIAGNOSIS — Z1211 Encounter for screening for malignant neoplasm of colon: Secondary | ICD-10-CM

## 2011-11-25 MED ORDER — PEG-KCL-NACL-NASULF-NA ASC-C 100 G PO SOLR: ORAL | Status: DC

## 2011-11-26 ENCOUNTER — Encounter: Payer: 59 | Admitting: Family Medicine

## 2011-11-26 ENCOUNTER — Encounter: Payer: 59 | Admitting: Internal Medicine

## 2011-12-03 ENCOUNTER — Encounter: Payer: Self-pay | Admitting: Internal Medicine

## 2011-12-03 ENCOUNTER — Ambulatory Visit (INDEPENDENT_AMBULATORY_CARE_PROVIDER_SITE_OTHER): Payer: Medicare Other | Admitting: Internal Medicine

## 2011-12-03 VITALS — BP 124/80 | HR 89 | Temp 97.5°F | Ht 61.5 in | Wt 164.0 lb

## 2011-12-03 DIAGNOSIS — E785 Hyperlipidemia, unspecified: Secondary | ICD-10-CM

## 2011-12-03 DIAGNOSIS — Z0000 Encounter for general adult medical examination without abnormal findings: Secondary | ICD-10-CM | POA: Diagnosis not present

## 2011-12-03 DIAGNOSIS — R9431 Abnormal electrocardiogram [ECG] [EKG]: Secondary | ICD-10-CM

## 2011-12-03 DIAGNOSIS — M949 Disorder of cartilage, unspecified: Secondary | ICD-10-CM

## 2011-12-03 DIAGNOSIS — K219 Gastro-esophageal reflux disease without esophagitis: Secondary | ICD-10-CM

## 2011-12-03 DIAGNOSIS — E119 Type 2 diabetes mellitus without complications: Secondary | ICD-10-CM

## 2011-12-03 DIAGNOSIS — M1990 Unspecified osteoarthritis, unspecified site: Secondary | ICD-10-CM

## 2011-12-03 DIAGNOSIS — R0789 Other chest pain: Secondary | ICD-10-CM

## 2011-12-03 DIAGNOSIS — M6740 Ganglion, unspecified site: Secondary | ICD-10-CM

## 2011-12-03 DIAGNOSIS — IMO0002 Reserved for concepts with insufficient information to code with codable children: Secondary | ICD-10-CM

## 2011-12-03 DIAGNOSIS — Z780 Asymptomatic menopausal state: Secondary | ICD-10-CM

## 2011-12-03 DIAGNOSIS — M899 Disorder of bone, unspecified: Secondary | ICD-10-CM

## 2011-12-03 DIAGNOSIS — D649 Anemia, unspecified: Secondary | ICD-10-CM

## 2011-12-03 DIAGNOSIS — L299 Pruritus, unspecified: Secondary | ICD-10-CM

## 2011-12-03 DIAGNOSIS — IMO0001 Reserved for inherently not codable concepts without codable children: Secondary | ICD-10-CM | POA: Diagnosis not present

## 2011-12-03 LAB — HEPATIC FUNCTION PANEL
ALT: 14 U/L (ref 0–35)
AST: 16 U/L (ref 0–37)
Albumin: 3.6 g/dL (ref 3.5–5.2)
Alkaline Phosphatase: 64 U/L (ref 39–117)
Bilirubin, Direct: 0 mg/dL (ref 0.0–0.3)
Total Bilirubin: 0.7 mg/dL (ref 0.3–1.2)
Total Protein: 7 g/dL (ref 6.0–8.3)

## 2011-12-03 LAB — BASIC METABOLIC PANEL
BUN: 27 mg/dL — ABNORMAL HIGH (ref 6–23)
CO2: 29 mEq/L (ref 19–32)
Calcium: 9 mg/dL (ref 8.4–10.5)
Chloride: 104 mEq/L (ref 96–112)
Creatinine, Ser: 1.2 mg/dL (ref 0.4–1.2)
GFR: 46.87 mL/min — ABNORMAL LOW (ref >60.00)
Glucose, Bld: 119 mg/dL — ABNORMAL HIGH (ref 70–99)
Potassium: 3.8 mEq/L (ref 3.5–5.1)
Sodium: 141 mEq/L (ref 135–145)

## 2011-12-03 LAB — CBC WITH DIFFERENTIAL/PLATELET
Basophils Absolute: 0 10*3/uL (ref 0.0–0.1)
Basophils Relative: 1 % (ref 0.0–3.0)
Eosinophils Absolute: 0.1 10*3/uL (ref 0.0–0.7)
Eosinophils Relative: 3.1 % (ref 0.0–5.0)
HCT: 36.5 % (ref 36.0–46.0)
Hemoglobin: 12.2 g/dL (ref 12.0–15.0)
Lymphocytes Relative: 44.1 % (ref 12.0–46.0)
Lymphs Abs: 1.8 10*3/uL (ref 0.7–4.0)
MCHC: 33.5 g/dL (ref 30.0–36.0)
MCV: 89.4 fl (ref 78.0–100.0)
Monocytes Absolute: 0.4 10*3/uL (ref 0.1–1.0)
Monocytes Relative: 9.6 % (ref 3.0–12.0)
Neutro Abs: 1.8 10*3/uL (ref 1.4–7.7)
Neutrophils Relative %: 42.2 % — ABNORMAL LOW (ref 43.0–77.0)
Platelets: 197 10*3/uL (ref 150.0–400.0)
RBC: 4.09 Mil/uL (ref 3.87–5.11)
RDW: 14.1 % (ref 11.5–14.6)
WBC: 4.2 10*3/uL — ABNORMAL LOW (ref 4.5–10.5)

## 2011-12-03 LAB — MICROALBUMIN / CREATININE URINE RATIO
Creatinine,U: 197.5 mg/dL
Microalb Creat Ratio: 0.4 mg/g (ref 0.0–30.0)
Microalb, Ur: 0.8 mg/dL (ref 0.0–1.9)

## 2011-12-03 LAB — LIPID PANEL
Cholesterol: 168 mg/dL (ref 0–200)
HDL: 45.1 mg/dL (ref >39.00)
Total CHOL/HDL Ratio: 4
Triglycerides: 210 mg/dL — ABNORMAL HIGH (ref 0.0–149.0)
VLDL: 42 mg/dL — ABNORMAL HIGH (ref 0.0–40.0)

## 2011-12-03 LAB — HEMOGLOBIN A1C: Hgb A1c MFr Bld: 6.8 % — ABNORMAL HIGH (ref 4.6–6.5)

## 2011-12-03 LAB — TSH: TSH: 1.81 u[IU]/mL (ref 0.35–5.50)

## 2011-12-03 LAB — LDL CHOLESTEROL, DIRECT: Direct LDL: 91.7 mg/dL

## 2011-12-03 MED ORDER — TRIAMCINOLONE ACETONIDE 0.5 % EX OINT: TOPICAL_OINTMENT | Freq: Two times a day (BID) | CUTANEOUS | Status: DC

## 2011-12-03 NOTE — Patient Instructions (Addendum)
Like to know about laboratory test when they are back. If the cysts on her hands and feet get worse or causing you symptoms and call us for referral evaluation  Unsure why you have itching on her back although sometimes a pinch nerve contuses her skin condition can try cortisone prescription twice a day for week or so and see how it does.  Will get a referral to  see cardiology about the chest pressure but it is not typical at this point of your heart  but you do have risk factors.

## 2011-12-03 NOTE — Progress Notes (Signed)
Subjective:    Patient ID: [name], female    DOB: 06/23/1930, 78 y.o.   MRN: [id number]  HPI Patient comes in today for a Medicare wellness visit followup of disease management and transfer of care from Dr. Joni Fears. This is her first visit with me as new PCP. She has a number of medical problems and medications.   hrt low dose   at half of 1 Estrace. For a number of years Dr. Joni Fears had her continue this to help her bone health no side effects. She's had a hysterectomy.   She has had a history of mastitis in itchy rash on the right breast your ago redo with antibiotic and a topical apparently mammogram was normal. She has occasional itching and uses the cream no lumps or rashes..   DM:  129 ok .  On medication without side effect or hypoglycemia  LIPIDS  No se of meds  GERD treatment : Nexium : Has of [place]  Gets pain if not taking     Having some  New chest heaviness lasting about 15-20 minutes to 30 minutes without associated symptoms or triggers comes and goes occurs not infrequently. She doesn't know what it is and asks if it could be her heart she apparently has had a chemical stress test a number of years ago which was normal or low risk   Vit d  3 x per week. On high dose  unsure when last level was done  Cyst on feet. Noticed left foot with a couple nodules on the arch these are nontender and not associated with numbness  Has a nodule on the left palm ring finger without associated weakness numbness redness.  She takes Lasix for swelling at half a pill.  Gets itching in mid back along the spine and she uses thinks to scratch it denies any specific rash but does feel flaky at times denies any significant neck pain or burning radiation Review of Systems ROS:  GEN/ HEENT: No fever, significant weight changes sweats headaches vision problems hearing changes, CV/ PULM; No chest pain shortness of breath cough, syncope,edema  change in exercise tolerance. GI /GU: No  adominal pain, vomiting, change in bowel habits. No blood in the stool. No significant GU symptoms. She does have long-standing fecal incontinence for a number of years to get a colonoscopy soon. SKIN/HEME: ,no acute skin rashes suspicious lesions or bleeding. No lymphadenopathy, nodules, masses.  Cyst in feet  NEURO/ PSYCH:  No neurologic signs such as weakness numbness. No depression anxiety. IMM/ Allergy: No unusual infections.  Allergy .   REST of 12 system review negative except as per HPI    Hearing: ok   Vision:  No limitations at present .  Safety:  Has smoke detector and wears seat belts.  No firearms. No excess sun exposure. Sees dentist regularly.  Falls: none  Advance directive :  Reviewed  Has one.  Memory: Felt to be good  , no concern from her or her family.  Depression: No anhedonia unusual crying or depressive symptoms  Nutrition: Eats well balanced diet; adequate calcium and vitamin D. No swallowing chewiing problems.  Injury: no major injuries in the last six months.  Other healthcare providers:  Reviewed today .  Social:  Lives with husband married. No pets.   Preventive parameters: up-to-date on colonoscopy, mammogram, immunizations. Last dexa ? A few years ago.ADLS:   There are no problems or need for assistance  driving, feeding, obtaining food, dressing, toileting  and bathing, managing money using phone. She is independent.  Neg td no exercise    Outpatient Encounter Prescriptions as of 12/03/2011  Medication Sig Dispense Refill  . Ascorbic Acid (VITAMIN C) 100 MG tablet Take 100 mg by mouth daily.        Marland Kitchen aspirin 81 MG chewable tablet Chew 81 mg by mouth daily.        Marland Kitchen atorvastatin (LIPITOR) 10 MG tablet qd1  90 tablet  3  . Biotin 10 MG TABS Take by mouth.        . Blood Glucose Calibration (OT ULTRA/FASTTK CNTRL SOLN) SOLN by In Vitro route. Use as instructed       . Calcium Carbonate-Vitamin D (CALCIUM 500 + D PO) Take by mouth. 3 days         .  calcium-vitamin D (OSCAL WITH D) 250-125 MG-UNIT per tablet Take 1 tablet by mouth daily.        Marland Kitchen docusate sodium (COLACE) 100 MG capsule Take 100 mg by mouth 2 (two) times daily.        Marland Kitchen esomeprazole (NEXIUM) 40 MG capsule Take 1 capsule (40 mg total) by mouth daily before breakfast.  90 capsule  3  . estradiol (ESTRACE) 1 MG tablet Take 0.5 tablets (0.5 mg total) by mouth daily.  90 tablet  3  . fish oil-omega-3 fatty acids 1000 MG capsule Take by mouth daily.        . furosemide (LASIX) 40 MG tablet Take 1 tablet (40 mg total) by mouth daily. For edema  90 tablet  3  . glipiZIDE (GLUCOTROL) 5 MG 24 hr tablet Take 1 tablet (5 mg total) by mouth daily.  90 tablet  3  . glucose blood (ONE TOUCH ULTRA TEST) test strip Use as instructed;  100 each  3  . Lancets (ONETOUCH ULTRASOFT) lancets Use as instructed  100 each  11  . peg 3350 powder (MOVIPREP) 100 G SOLR moviprep as directed  1 kit  0  . vitamin B-12 (CYANOCOBALAMIN) 1000 MCG tablet Take 1,000 mcg by mouth daily.      . Vitamin D, Cholecalciferol, 400 UNITS CHEW Chew by mouth.        . triamcinolone ointment (KENALOG) 0.5 % Apply topically 2 (two) times daily.  30 g  0   Past history family history social history reviewed in the electronic medical record.      Objective:   Physical Exam BP 124/80  Pulse 89  Temp(Src) 97.5 F (36.4 C) (Oral)  Ht 5' 1.5" (1.562 m)  Wt 164 lb (74.39 kg)  BMI 30.49 kg/m2  SpO2 96%  Physical Exam: Vital signs reviewed [id number] is a well-developed well-nourished alert cooperative  white female who appears her stated age in no acute distress.  HEENT: normocephalic atraumatic , Eyes: PERRL EOM's full, conjunctiva clear, Nares: paten,t no deformity discharge or tenderness., Ears: no deformity EAC's clear TMs with normal landmarks. Mouth: clear OP, no lesions, edema.  Moist mucous membranes. Dentition partials and dentures NECK: supple without masses, thyromegaly or bruits. CHEST/PULM:  Clear to  auscultation and percussion breath sounds equal no wheeze , rales or rhonchi. No chest wall deformities or tenderness. Breast: normal by inspection . No dimpling, discharge, masses, tenderness or discharge . Slight amount of flaking right nipple but no masses or discharge axilla is clear  CV: PMI is nondisplaced, S1 S2 no gallops, , rubs. 2/6 systolic murmur left sternal border regular rhythm Peripheral pulses are full without  delay.No JVD .  ABDOMEN: Bowel sounds normal nontender  No guard or rebound, no hepato splenomegal no CVA tenderness.  No hernia. Extremtities:  No clubbing cyanosis or edema, no acute joint swelling or redness no focal atrophy NEURO:  Oriented x3, cranial nerves 3-12 appear to be intact, no obvious focal weakness,gait within normal limits no abnormal reflexes or asymmetrical SKIN: No acute rashes normal turgor, color, no bruising or petechiae.  Back shows some midline scaling follicular type rash excoriated areas no suspicious lesion left hand small cyst ring tendon alos arch of ffot with nt cyt area  Nl function PSYCH: Oriented, good eye contact, no obvious depression anxiety, cognition and judgment appear normal. LN: no cervical axillary inguinal adenopathy Diabetic foot exam shows normal pulses no suspicious calluses all sirs sensation to monofilament intact.  EKG shows right bundle branch block with bifascicular block no acute changes no previous EKG can be found in the electronic record to compare although the order is that it was done in 2009.    Assessment & Plan: Preventive Health Care Counseled regarding healthy nutrition, exercise, sleep, injury prevention, calcium vit d and healthy weight .     Preventive Health Care Counseled regarding healthy nutrition, exercise, sleep, injury prevention, calcium vit d and healthy weight . Diabetes  no obvious complications continue medications at this time. No neuropathy obvious Lipids no side effects of medicine check  level today GERDStable requires medication Replacement therapy minimal amount estrogen only risk-benefit discussed continue at this time. Swelling taking Lasix as needed no potassium replacement we'll check chemistry today History of right breast mastitis exam looks pretty normal today we'll follow closely. Degenerative joint disease with history of shoulder and knee replacements Dr.A Reynaldo Minium. Chest pressure atypical undefined; chest symptoms does have a risk for cardiovascular with history of diabetes at age etc. Will do cardiology opinion as to opinion of further testing. Will refer to Dr. Stanford Breed who is her husband's cardiologist. Vit D switch over to over-the-counter cells and units no reason to maintain on high-dose at this time. GI to get colonoscopy soon Dr. Henrene Pastor Itching on back  uncertain etiology but she is in a scratch itch cycle use topical steroid and follow up if persistent or progressive. No obvious neuropathy. Cyst  Hand and arch of foot watch and get referral if symptomatic If laboratory studies are good we can see her in 6 months.

## 2011-12-05 MED ORDER — ESTRADIOL 1 MG PO TABS: 0.5000 mg | ORAL_TABLET | Freq: Every day | ORAL | Status: DC

## 2011-12-05 MED ORDER — FUROSEMIDE 40 MG PO TABS: 40.0000 mg | ORAL_TABLET | Freq: Every day | ORAL | Status: DC

## 2011-12-05 MED ORDER — GLIPIZIDE ER 5 MG PO TB24: 5.0000 mg | ORAL_TABLET | Freq: Every day | ORAL | Status: DC

## 2011-12-05 MED ORDER — ATORVASTATIN CALCIUM 10 MG PO TABS: 10.0000 mg | ORAL_TABLET | Freq: Every day | ORAL | Status: DC

## 2011-12-05 MED ORDER — GLUCOSE BLOOD VI STRP: ORAL_STRIP | Status: DC

## 2011-12-05 MED ORDER — ESOMEPRAZOLE MAGNESIUM 40 MG PO CPDR: 40.0000 mg | DELAYED_RELEASE_CAPSULE | Freq: Every day | ORAL | Status: DC

## 2011-12-05 MED ORDER — ONETOUCH ULTRASOFT LANCETS MISC: Status: DC

## 2011-12-05 MED ORDER — ONETOUCH ULTRA CONTROL VI SOLN: Status: DC

## 2011-12-05 NOTE — Progress Notes (Signed)
Addended by: Colleen Can on: 12/05/2011 03:12 PM   Modules accepted: Orders

## 2011-12-09 ENCOUNTER — Encounter: Payer: Self-pay | Admitting: Internal Medicine

## 2011-12-09 ENCOUNTER — Ambulatory Visit (AMBULATORY_SURGERY_CENTER): Payer: Medicare Other | Admitting: Internal Medicine

## 2011-12-09 VITALS — BP 149/76 | HR 87 | Temp 98.5°F | Resp 20 | Ht 62.0 in | Wt 163.0 lb

## 2011-12-09 DIAGNOSIS — Z860100 Personal history of colon polyps, unspecified: Secondary | ICD-10-CM

## 2011-12-09 DIAGNOSIS — K5730 Diverticulosis of large intestine without perforation or abscess without bleeding: Secondary | ICD-10-CM

## 2011-12-09 DIAGNOSIS — Z1211 Encounter for screening for malignant neoplasm of colon: Secondary | ICD-10-CM | POA: Diagnosis not present

## 2011-12-09 DIAGNOSIS — D126 Benign neoplasm of colon, unspecified: Secondary | ICD-10-CM

## 2011-12-09 LAB — GLUCOSE, CAPILLARY
Glucose-Capillary: 169 mg/dL — ABNORMAL HIGH (ref 70–99)
Glucose-Capillary: 144 mg/dL — ABNORMAL HIGH (ref 70–99)

## 2011-12-09 MED ORDER — SODIUM CHLORIDE 0.9 % IV SOLN: 500.0000 mL | INTRAVENOUS | Status: DC

## 2011-12-09 NOTE — Progress Notes (Signed)
Patient did not experience any of the following events: a burn prior to discharge; a fall within the facility; wrong site/side/patient/procedure/implant event; or a hospital transfer or hospital admission upon discharge from the facility. (G8907) Patient did not have preoperative order for IV antibiotic SSI prophylaxis. (G8918)  

## 2011-12-09 NOTE — Patient Instructions (Signed)
Discharge instructions given with verbal understanding. Handouts on polyps,diverticulosis and hemorrhoids given. Resume previous medications. YOU HAD AN ENDOSCOPIC PROCEDURE TODAY AT THE [facility]: Refer to the procedure report that was given to you for any specific questions about what was found during the examination.  If the procedure report does not answer your questions, please call your gastroenterologist to clarify.  If you requested that your care partner not be given the details of your procedure findings, then the procedure report has been included in a sealed envelope for you to review at your convenience later.  YOU SHOULD EXPECT: Some feelings of bloating in the abdomen. Passage of more gas than usual.  Walking can help get rid of the air that was put into your GI tract during the procedure and reduce the bloating. If you had a lower endoscopy (such as a colonoscopy or flexible sigmoidoscopy) you may notice spotting of blood in your stool or on the toilet paper. If you underwent a bowel prep for your procedure, then you may not have a normal bowel movement for a few days.  DIET: Your first meal following the procedure should be a light meal and then it is ok to progress to your normal diet.  A half-sandwich or bowl of soup is an example of a good first meal.  Heavy or fried foods are harder to digest and may make you feel nauseous or bloated.  Likewise meals heavy in dairy and vegetables can cause extra gas to form and this can also increase the bloating.  Drink plenty of fluids but you should avoid alcoholic beverages for 24 hours.  ACTIVITY: Your care partner should take you home directly after the procedure.  You should plan to take it easy, moving slowly for the rest of the day.  You can resume normal activity the day after the procedure however you should NOT DRIVE or use heavy machinery for 24 hours (because of the sedation medicines used during the test).    SYMPTOMS TO  REPORT IMMEDIATELY: A gastroenterologist can be reached at any hour.  During normal business hours, 8:30 AM to 5:00 PM Monday through Friday, call [phone].  After hours and on weekends, please call the GI answering service at [phone] who will take a message and have the physician on call contact you.   Following lower endoscopy (colonoscopy or flexible sigmoidoscopy):  Excessive amounts of blood in the stool  Significant tenderness or worsening of abdominal pains  Swelling of the abdomen that is new, acute  Fever of 100F or higher  FOLLOW UP: If any biopsies were taken you will be contacted by phone or by letter within the next 1-3 weeks.  Call your gastroenterologist if you have not heard about the biopsies in 3 weeks.  Our staff will call the home number listed on your records the next business day following your procedure to check on you and address any questions or concerns that you may have at that time regarding the information given to you following your procedure. This is a courtesy call and so if there is no answer at the home number and we have not heard from you through the emergency physician on call, we will assume that you have returned to your regular daily activities without incident.  SIGNATURES/CONFIDENTIALITY: You and/or your care partner have signed paperwork which will be entered into your electronic medical record.  These signatures attest to the fact that that the information above on your After Visit   Summary has been reviewed and is understood.  Full responsibility of the confidentiality of this discharge information lies with you and/or your care-partner.  

## 2011-12-09 NOTE — Op Note (Signed)
[place] [address]. [place], [place]  [id number]  COLONOSCOPY PROCEDURE REPORT  PATIENT:  Ashley, [name]  MR#:  [id number] BIRTHDATE:  [birth year], 81 yrs. old  GENDER:  female ENDOSCOPIST:  Docia Chuck. Geri Seminole, MD REF. BY:  Surveillance Program Recall, PROCEDURE DATE:  12/09/2011 PROCEDURE:  Colonoscopy with snare polypectomy x 1 ASA CLASS:  Class II INDICATIONS:  history of polyps, surveillance and high-risk screening index 11-2006 [facility]) MEDICATIONS:   MAC sedation, administered by CRNA, propofol (Diprivan) 170 mg IV  DESCRIPTION OF PROCEDURE:   After the risks benefits and alternatives of the procedure were thoroughly explained, informed consent was obtained.  Digital rectal exam was performed and revealed no abnormalities.   The LB [id number] endoscope was introduced through the anus and advanced to the cecum, which was identified by both the appendix and ileocecal valve, without limitations.  The quality of the prep was excellent, using MoviPrep.  The instrument was then slowly withdrawn as the colon was fully examined. <<PROCEDUREIMAGES>>  FINDINGS:  A diminutive polyp was found in the mid transverse colon and snared without cautery. Retrieval was successful. Moderate diverticulosis was found in the sigmoid colon.  Otherwise normal colonoscopy without other polyps, masses, vascular ectasias, or inflammatory changes.   Retroflexed views in the rectum revealed internal hemorrhoids.    The time to cecum =  6:10 minutes. The scope was then withdrawn in   10;49  minutes from the cecum and the procedure completed.  COMPLICATIONS:  None  ENDOSCOPIC IMPRESSION: 1) Diminutive polyp in the mid transverse colon - removed 2) Moderate diverticulosis in the sigmoid colon 3) Otherwise normal colonoscopy 4) Internal  and external hemorrhoids  RECOMMENDATIONS: 1) Return to the care of your primary provider. GI follow up  as needed  ______________________________ Docia Chuck. Geri Seminole, MD  CC:  Burnis Medin, MD;  The Patient  n. eSIGNED:   Docia Chuck. Geri Seminole at 12/09/2011 10:18 AM  Jill Poling, [id number]

## 2011-12-10 ENCOUNTER — Telehealth: Payer: Self-pay | Admitting: *Deleted

## 2011-12-10 ENCOUNTER — Telehealth: Payer: Self-pay | Admitting: Family Medicine

## 2011-12-10 MED ORDER — ESOMEPRAZOLE MAGNESIUM 40 MG PO CPDR: 40.0000 mg | DELAYED_RELEASE_CAPSULE | Freq: Every day | ORAL | Status: DC

## 2011-12-10 NOTE — Telephone Encounter (Signed)
  Follow up Call-  Call back number 12/09/2011  Post procedure Call Back phone  # [phone]  Permission to leave phone message Yes     Patient questions:  Do you have a fever, pain , or abdominal swelling? no Pain Score  0 *  Have you tolerated food without any problems? yes  Have you been able to return to your normal activities? yes  Do you have any questions about your discharge instructions: Diet   no Medications  no Follow up visit  no  Do you have questions or concerns about your Care? no  Actions: * If pain score is 4 or above: No action needed, pain <4.

## 2011-12-10 NOTE — Telephone Encounter (Signed)
Pulled from Triage vmail. Pt would like you to call her about a RX to be sent to [facility] - she did not say which med it was for. Thanks.

## 2011-12-10 NOTE — Telephone Encounter (Signed)
Pt request nexium be sent to [facility]-done

## 2011-12-13 ENCOUNTER — Encounter: Payer: Self-pay | Admitting: Internal Medicine

## 2012-01-02 ENCOUNTER — Ambulatory Visit (INDEPENDENT_AMBULATORY_CARE_PROVIDER_SITE_OTHER): Payer: Medicare Other | Admitting: Cardiology

## 2012-01-02 ENCOUNTER — Encounter: Payer: Self-pay | Admitting: Cardiology

## 2012-01-02 VITALS — BP 140/77 | HR 90 | Wt 159.0 lb

## 2012-01-02 DIAGNOSIS — R079 Chest pain, unspecified: Secondary | ICD-10-CM

## 2012-01-02 DIAGNOSIS — R0789 Other chest pain: Secondary | ICD-10-CM | POA: Diagnosis not present

## 2012-01-02 DIAGNOSIS — E785 Hyperlipidemia, unspecified: Secondary | ICD-10-CM | POA: Diagnosis not present

## 2012-01-02 DIAGNOSIS — R9431 Abnormal electrocardiogram [ECG] [EKG]: Secondary | ICD-10-CM

## 2012-01-02 NOTE — Progress Notes (Signed)
HPI: 78 year old female for evaluation of chest pain. She has no prior history of cardiac disease. Over the past year she has had occasional chest pain. It is described as a pressure. It only occurs at night. It does not radiate. No associated symptoms. No associated water brash. Not pleuritic, positional, exertional or related to food. She goes back to sleep and the pain resolves. She does not have exertional chest pain but she does have some dyspnea on exertion. Pedal edema is chronic and controlled with diuretics. No orthopnea or PND. Because of her chest pain cardiology was asked to evaluate.   Current Outpatient Prescriptions  Medication Sig Dispense Refill  . Ascorbic Acid (VITAMIN C) 100 MG tablet Take 100 mg by mouth daily.        Marland Kitchen aspirin 81 MG chewable tablet Chew 81 mg by mouth daily.        Marland Kitchen atorvastatin (LIPITOR) 10 MG tablet Take 1 tablet (10 mg total) by mouth daily. qd1  90 tablet  3  . Biotin 1000 MCG tablet Take 1,000 mcg by mouth daily.      . Blood Glucose Calibration (OT ULTRA/FASTTK CNTRL SOLN) SOLN Use as instructed  1 each  3  . Calcium Carbonate-Vitamin D (CALCIUM 500 + D PO) Take by mouth. 3 days         . calcium-vitamin D (OSCAL WITH D) 250-125 MG-UNIT per tablet Take 1 tablet by mouth daily.        Marland Kitchen docusate sodium (COLACE) 100 MG capsule Take 100 mg by mouth daily.       Marland Kitchen esomeprazole (NEXIUM) 40 MG capsule Take 1 capsule (40 mg total) by mouth daily before breakfast.  90 capsule  3  . estradiol (ESTRACE) 1 MG tablet Take 0.5 tablets (0.5 mg total) by mouth daily.  90 tablet  3  . fish oil-omega-3 fatty acids 1000 MG capsule Take by mouth daily.        . furosemide (LASIX) 40 MG tablet Take 1 tablet (40 mg total) by mouth daily. For edema  90 tablet  3  . glipiZIDE (GLUCOTROL XL) 5 MG 24 hr tablet Take 1 tablet (5 mg total) by mouth daily.  90 tablet  3  . glucose blood (ONE TOUCH ULTRA TEST) test strip Use as instructed;  100 each  3  . Lancets (ONETOUCH  ULTRASOFT) lancets Use as instructed  100 each  11  . peg 3350 powder (MOVIPREP) 100 G SOLR moviprep as directed  1 kit  0  . triamcinolone ointment (KENALOG) 0.5 % Apply topically 2 (two) times daily.  30 g  0  . vitamin B-12 (CYANOCOBALAMIN) 1000 MCG tablet Take 1,000 mcg by mouth daily.      . Vitamin D, Cholecalciferol, 400 UNITS CHEW Chew by mouth.          No Known Allergies  Past Medical History  Diagnosis Date  . Arthritis   . Diabetes mellitus   . Colon polyps   . Diverticulosis   . Hemorrhoids   . Dyslipidemia   . GERD (gastroesophageal reflux disease)     Past Surgical History  Procedure Date  . Joint replacement 2010    shoulder  . Left knee replacement left 2010  . Right knee replacedment 2003  . Umbilical hernia repair [id number]  . Vaginal hysterectomy 1975  . Carpal tunnel release 1977    right hand  . Cataract extraction w/ intraocular lens  implant, bilateral     left 1996;  right 1997  . Ganglion cyst excision 2003    right hand    History   Social History  . Marital Status: Married    Spouse Name: N/A    Number of Children: 2  . Years of Education: N/A   Occupational History  . Not on file.   Social History Main Topics  . Smoking status: Never Smoker   . Smokeless tobacco: Never Used  . Alcohol Use: No  . Drug Use: No  . Sexually Active: No   Other Topics Concern  . Not on file   Social History Narrative   HH 2 marriedWorks for [employer] for 23 years retiredNo pets     Family History  Problem Relation Age of Onset  . Colon cancer Neg Hx   . Stomach cancer Neg Hx   . Coronary artery disease Brother     MI in his 61s    ROS: arthralgias but no fevers or chills, productive cough, hemoptysis, dysphasia, odynophagia, melena, hematochezia, dysuria, hematuria, rash, seizure activity, orthopnea, PND, pedal edema, claudication. Remaining systems are negative.  Physical Exam:   Blood pressure 140/77, pulse 90, weight 72.122 kg (159  lb).  General:  Well developed/well nourished in NAD Skin warm/dry Patient not depressed No peripheral clubbing Back-normal HEENT-normal/normal eyelids Neck supple/normal carotid upstroke bilaterally; no bruits; no JVD; no thyromegaly chest - CTA/ normal expansion CV - RRR/normal S1 and S2; no rubs or gallops;  PMI nondisplaced, 1/6 systolic murmur left sternal border. Abdomen -NT/ND, no HSM, no mass, + bowel sounds, no bruit 2+ femoral pulses, no bruits Ext-no edema, chords, 2+ DP, varicosities noted  Neuro-grossly nonfocal  ECG Dec 03 2011-sinus rhythm, right bundle branch block, left anterior fascicular block.

## 2012-01-02 NOTE — Patient Instructions (Addendum)
Your physician recommends that you schedule a follow-up appointment in:  AS NEEDED PENDING TEST RESULTS  Your physician has requested that you have a lexiscan myoview. For further information please visit [email or web address]. Please follow instruction sheet, as given.   

## 2012-01-02 NOTE — Assessment & Plan Note (Signed)
Management per primary care. 

## 2012-01-02 NOTE — Assessment & Plan Note (Signed)
Symptoms are atypical but multiple risk factors including diabetes mellitus. Will arrange Myoview for risk stratification.

## 2012-01-02 NOTE — Assessment & Plan Note (Signed)
Right bundle branch block and left anterior fascicular block noted on electrocardiogram.No history of syncope.

## 2012-01-13 ENCOUNTER — Ambulatory Visit (INDEPENDENT_AMBULATORY_CARE_PROVIDER_SITE_OTHER): Payer: Medicare Other | Admitting: Internal Medicine

## 2012-01-13 ENCOUNTER — Encounter: Payer: Self-pay | Admitting: Internal Medicine

## 2012-01-13 VITALS — BP 154/70 | HR 97 | Temp 98.0°F | Wt 161.0 lb

## 2012-01-13 DIAGNOSIS — J312 Chronic pharyngitis: Secondary | ICD-10-CM

## 2012-01-13 DIAGNOSIS — R0982 Postnasal drip: Secondary | ICD-10-CM

## 2012-01-13 DIAGNOSIS — R490 Dysphonia: Secondary | ICD-10-CM | POA: Diagnosis not present

## 2012-01-13 DIAGNOSIS — J329 Chronic sinusitis, unspecified: Secondary | ICD-10-CM | POA: Diagnosis not present

## 2012-01-13 MED ORDER — FLUTICASONE PROPIONATE 50 MCG/ACT NA SUSP: 2.0000 | Freq: Every day | NASAL | Status: DC

## 2012-01-13 MED ORDER — AMOXICILLIN 500 MG PO CAPS: 500.0000 mg | ORAL_CAPSULE | Freq: Three times a day (TID) | ORAL | Status: AC

## 2012-01-13 NOTE — Patient Instructions (Signed)
  Your sore throat could be from a low grade sinus infection with postnasal drainage.  Suggest we give you an antibiotic empirically begin on nasal cortisone in addition you can try an over-the-counter antihistamine such as Claritin generic.  If you're not getting better in another 2 weeks contact us and we will arrange for an ear nose and throat Dr. to see you.   Silent acid reflux could also cause these symptoms even though you're on medication.

## 2012-01-13 NOTE — Progress Notes (Signed)
  Subjective:    Patient ID: [name], female    DOB: 1930/01/08, 78 y.o.   MRN: [id number]  HPI Patient comes in today for SDA for  new problem evaluation.Work in.  [name] working her into the clinic because her husband is here for his appointment. She is concerned because she has had what she calls a sore throat since her last visit here in May. She states it feels burning feels like she has postnasal drainage and cough things up sometimes clear sometimes green. Denies any fever face pain or history of allergies. Her right ear is itchy at times. She states that her voice has become hoarse but her reflux is stable. She coughs occasionally uses sugar-free candy and occasional cough drops.  She is getting a cardiac stress test chemical tomorrow. Review of Systems Negative for fever weight loss or chills hearing loss bleeding. No true dysphasia.No hx of documented allergies . Asthma Rest as per history of present illness states her blood sugars pretty good with fasting blood sugars in the low 100s.  Past history family history social history reviewed in the electronic medical record. '    Objective:   Physical Exam BP 154/70  Pulse 97  Temp 98 F (36.7 C) (Oral)  Wt 161 lb (73.029 kg)  SpO2 95% Well-developed well-nourished healthy-appearing in no acute distress she is mildly hoarse. Cough is an upper airway type cough and minimal. Normocephalic TMs clear wax in left canal right slightly flaky but no redness or swelling Nares slight congestion face nontender OP low-lying palate slight erythema no drainage is seen Neck shows no adenopathy or bruit heard. Chest clear to auscultation quite respirations cardiac S1-S2 no gallops or rubs. No murmur. Skin no acute bruising or petechiae     Assessment & Plan:   Prolonged sore throat with hoarseness and possible postnasal drainage.  She appears to be under adequate treatment for reflux. Would treat for a low-grade sinusitis with  amoxicillin and Flonase and she can add over-the-counter loratadine. If not improving and persistent would have ear nose and throat evaluate.  DM stable  GERD rx.

## 2012-01-14 ENCOUNTER — Ambulatory Visit (HOSPITAL_COMMUNITY): Payer: Medicare Other | Attending: Cardiology | Admitting: Radiology

## 2012-01-14 VITALS — BP 141/82 | HR 85 | Ht 61.5 in | Wt 159.0 lb

## 2012-01-14 DIAGNOSIS — R0789 Other chest pain: Secondary | ICD-10-CM | POA: Diagnosis not present

## 2012-01-14 DIAGNOSIS — E119 Type 2 diabetes mellitus without complications: Secondary | ICD-10-CM | POA: Diagnosis not present

## 2012-01-14 DIAGNOSIS — E785 Hyperlipidemia, unspecified: Secondary | ICD-10-CM | POA: Diagnosis not present

## 2012-01-14 DIAGNOSIS — Z8249 Family history of ischemic heart disease and other diseases of the circulatory system: Secondary | ICD-10-CM | POA: Insufficient documentation

## 2012-01-14 DIAGNOSIS — R0602 Shortness of breath: Secondary | ICD-10-CM

## 2012-01-14 DIAGNOSIS — R079 Chest pain, unspecified: Secondary | ICD-10-CM | POA: Diagnosis not present

## 2012-01-14 DIAGNOSIS — R0609 Other forms of dyspnea: Secondary | ICD-10-CM | POA: Insufficient documentation

## 2012-01-14 DIAGNOSIS — R0989 Other specified symptoms and signs involving the circulatory and respiratory systems: Secondary | ICD-10-CM | POA: Insufficient documentation

## 2012-01-14 DIAGNOSIS — R002 Palpitations: Secondary | ICD-10-CM | POA: Insufficient documentation

## 2012-01-14 HISTORY — PX: CARDIOVASCULAR STRESS TEST: SHX262

## 2012-01-14 MED ORDER — TECHNETIUM TC 99M TETROFOSMIN IV KIT
11.0000 | PACK | Freq: Once | INTRAVENOUS | Status: AC | PRN
  Administered 2012-01-14: 11 via INTRAVENOUS

## 2012-01-14 MED ORDER — REGADENOSON 0.4 MG/5ML IV SOLN
0.4000 mg | Freq: Once | INTRAVENOUS | Status: AC
  Administered 2012-01-14: 0.4 mg via INTRAVENOUS

## 2012-01-14 MED ORDER — TECHNETIUM TC 99M TETROFOSMIN IV KIT
33.0000 | PACK | Freq: Once | INTRAVENOUS | Status: AC | PRN
  Administered 2012-01-14: 33 via INTRAVENOUS

## 2012-01-14 NOTE — Progress Notes (Signed)
[place] [address] [phone]  Cardiology Nuclear Med Study  [name] is a 78 y.o. female     MRN : [id number]     DOB: [birth year]  Procedure Date: 01/14/2012  Nuclear Med Background Indication for Stress Test:  Evaluation for Ischemia  History:  '[id number], EF=76%. Cardiac Risk Factors: Family History - CAD, Lipids and NIDDM  Symptoms:  Chest Pressure.  (last episode of chest discomfort is now, 1/10), DOE and Palpitations   Nuclear Pre-Procedure Caffeine/Decaff Intake:  None > 12 hrs NPO After: 8:00am   Lungs:  clear O2 Sat: 98% on room air. IV 0.9% NS with Angio Cath:  22g  IV Site: R Hand x 1, tolerated well IV Started by:  Irven Baltimore, RN  Chest Size (in):  42 Cup Size: C  Height: 5' 1.5" (1.562 m)  Weight:  159 lb (72.122 kg)  BMI:  Body mass index is 29.56 kg/(m^2). Tech Comments:  Held glipizide this am    Nuclear Med Study 1 or 2 day study: 1 day  Stress Test Type:  Treadmill/Lexiscan  Reading MD: Loralie Champagne, MD  Order Authorizing Provider:  Kirk Ruths, MD  Resting Radionuclide: Technetium 27m Tetrofosmin  Resting Radionuclide Dose: 11.0 mCi   Stress Radionuclide:  Technetium 36m Tetrofosmin  Stress Radionuclide Dose: 33.0 mCi           Stress Protocol Rest HR: 85 Stress HR: 125  Rest BP: 141/82 Stress BP: 172/59  Exercise Time (min): 2:00 METS: n/a   Predicted Max HR: 141 bpm % Max HR: 88.65 bpm Rate Pressure Product: 21500   Dose of Adenosine (mg):  n/a Dose of Lexiscan: 0.4 mg  Dose of Atropine (mg): n/a Dose of Dobutamine: n/a mcg/kg/min (at max HR)  Stress Test Technologist: Letta Moynahan, CMA-N  Nuclear Technologist:  Charlton Amor, CNMT     Rest Procedure:  Myocardial perfusion imaging was performed at rest 45 minutes following the intravenous administration of Technetium 87m Tetrofosmin.  Rest ECG: RBBB/LAFB.  Stress Procedure:  The patient received IV  Lexiscan 0.4 mg over 15-seconds with concurrent low level exercise and then Technetium 55m Tetrofosmin was injected at 30-seconds while the patient continued walking one more minute. There were no significant changes with Lexiscan. Quantitative spect images were obtained after a 45-minute delay.  Stress ECG: No significant change from baseline ECG  QPS Raw Data Images:  Normal; no motion artifact; normal heart/lung ratio. Stress Images:  Small, mild perfusion defect involving apical lateral wall and apex.  Rest Images:  Normal homogeneous uptake in all areas of the myocardium. Subtraction (SDS):  Small, reversible apical lateral and apical perfusion defect.  Transient Ischemic Dilatation (Normal <1.22):  0.74 Lung/Heart Ratio (Normal <0.45):  0.11  Quantitative Gated Spect Images QGS EDV:  36 ml QGS ESV:  5 ml  Impression Exercise Capacity:  Lexiscan with low level exercise. BP Response:  Normal blood pressure response. Clinical Symptoms:  Nausea, lightheaded.  ECG Impression:  No significant ST segment change suggestive of ischemia. Comparison with Prior Nuclear Study: Prior study showed no perfusion defect.   Overall Impression:  Low risk stress nuclear study.  Small mild apical and apical lateral reversible perfusion defect represents a small area of ischemia versus shifting breast artifact.   LV Ejection Fraction: 86%.  LV Wall Motion:  NL LV Function; NL Wall Motion  Loralie Champagne 01/14/2012

## 2012-01-15 ENCOUNTER — Other Ambulatory Visit: Payer: Medicare Other

## 2012-02-28 ENCOUNTER — Ambulatory Visit (INDEPENDENT_AMBULATORY_CARE_PROVIDER_SITE_OTHER): Payer: Medicare Other | Admitting: Cardiology

## 2012-02-28 ENCOUNTER — Encounter: Payer: Self-pay | Admitting: Cardiology

## 2012-02-28 VITALS — BP 135/63 | HR 90 | Ht 61.5 in | Wt 160.8 lb

## 2012-02-28 DIAGNOSIS — E785 Hyperlipidemia, unspecified: Secondary | ICD-10-CM | POA: Diagnosis not present

## 2012-02-28 DIAGNOSIS — R9430 Abnormal result of cardiovascular function study, unspecified: Secondary | ICD-10-CM | POA: Insufficient documentation

## 2012-02-28 NOTE — Progress Notes (Signed)
HPI: Pleasant female for fu of chest pain and abnormal functional study. Myoview in July of 2013 showed an ejection fraction of 86%. There was a small apical and apical lateral perfusion defect suggestive of small area of ischemia versus shifting breast attenuation. Since I last saw her, she denies dyspnea on exertion, orthopnea, PND, pedal edema or syncope. She does not have exertional chest pain. She occasionally has indigestion.   Current Outpatient Prescriptions  Medication Sig Dispense Refill  . Ascorbic Acid (VITAMIN C) 100 MG tablet Take 100 mg by mouth daily.        Marland Kitchen aspirin 81 MG chewable tablet Chew 81 mg by mouth daily.        Marland Kitchen atorvastatin (LIPITOR) 10 MG tablet Take 1 tablet (10 mg total) by mouth daily. qd1  90 tablet  3  . Biotin 1000 MCG tablet Take 1,000 mcg by mouth daily.      . Blood Glucose Calibration (OT ULTRA/FASTTK CNTRL SOLN) SOLN Use as instructed  1 each  3  . Calcium Carbonate-Vitamin D (CALCIUM 500 + D PO) Take by mouth. 3 days         . docusate sodium (COLACE) 100 MG capsule Take 100 mg by mouth daily.       Marland Kitchen esomeprazole (NEXIUM) 40 MG capsule Take 1 capsule (40 mg total) by mouth daily before breakfast.  90 capsule  3  . estradiol (ESTRACE) 1 MG tablet Take 0.5 tablets (0.5 mg total) by mouth daily.  90 tablet  3  . fish oil-omega-3 fatty acids 1000 MG capsule Take by mouth daily.        . furosemide (LASIX) 40 MG tablet Take 1 tablet (40 mg total) by mouth daily. For edema  90 tablet  3  . glipiZIDE (GLUCOTROL XL) 5 MG 24 hr tablet Take 1 tablet (5 mg total) by mouth daily.  90 tablet  3  . glucose blood (ONE TOUCH ULTRA TEST) test strip Use as instructed;  100 each  3  . Lancets (ONETOUCH ULTRASOFT) lancets Use as instructed  100 each  11  . vitamin B-12 (CYANOCOBALAMIN) 1000 MCG tablet Take 1,000 mcg by mouth daily.      . Vitamin D, Cholecalciferol, 400 UNITS CHEW Chew by mouth.           Past Medical History  Diagnosis Date  . Arthritis   .  Diabetes mellitus   . Colon polyps   . Diverticulosis   . Hemorrhoids   . Dyslipidemia   . GERD (gastroesophageal reflux disease)     Past Surgical History  Procedure Date  . Joint replacement 2010    shoulder  . Left knee replacement left 2010  . Right knee replacedment 2003  . Umbilical hernia repair [id number]  . Vaginal hysterectomy 1975  . Carpal tunnel release 1977    right hand  . Cataract extraction w/ intraocular lens  implant, bilateral     left 1996; right 1997  . Ganglion cyst excision 2003    right hand    History   Social History  . Marital Status: Married    Spouse Name: N/A    Number of Children: 2  . Years of Education: N/A   Occupational History  . Not on file.   Social History Main Topics  . Smoking status: Never Smoker   . Smokeless tobacco: Never Used  . Alcohol Use: No  . Drug Use: No  . Sexually Active: No   Other Topics  Concern  . Not on file   Social History Narrative   HH 2 marriedWorks for [employer] for 40 years retiredNo pets     ROS: no fevers or chills, productive cough, hemoptysis, dysphasia, odynophagia, melena, hematochezia, dysuria, hematuria, rash, seizure activity, orthopnea, PND, pedal edema, claudication. Remaining systems are negative.  Physical Exam: Well-developed well-nourished in no acute distress.  Skin is warm and dry.  HEENT is normal.  Neck is supple.  Chest is clear to auscultation with normal expansion.  Cardiovascular exam is regular rate and rhythm.  Abdominal exam nontender or distended. No masses palpated. Extremities show no edema. neuro grossly intact

## 2012-02-28 NOTE — Patient Instructions (Addendum)
Your physician wants you to follow-up in: 6 MONTHS WITH DR CRENSHAW You will receive a reminder letter in the mail two months in advance. If you don't receive a letter, please call our office to schedule the follow-up appointment.  

## 2012-02-28 NOTE — Assessment & Plan Note (Signed)
Continue statin.lipids and liver monitored by primary care. 

## 2012-02-28 NOTE — Assessment & Plan Note (Signed)
I have reviewed the patient's nuclear study with she and her husband. There is a small area of apical ischemia versus shifting breast attenuation. It is not a high risk study. She is not having exertional chest pain. I reviewed the options of cardiac catheterization now versus observation with followup nuclear studies in the future/cardiac catheterization if symptoms change. She would prefer conservative therapy at this point. Continue aspirin and statin. Patient instructed on symptoms such as exertional chest pain and we can reconsider if her symptoms change. Followup 6 months.

## 2012-04-21 ENCOUNTER — Ambulatory Visit (INDEPENDENT_AMBULATORY_CARE_PROVIDER_SITE_OTHER): Payer: Medicare Other | Admitting: Family Medicine

## 2012-04-21 DIAGNOSIS — Z23 Encounter for immunization: Secondary | ICD-10-CM | POA: Diagnosis not present

## 2012-06-09 ENCOUNTER — Other Ambulatory Visit (INDEPENDENT_AMBULATORY_CARE_PROVIDER_SITE_OTHER): Payer: Medicare Other

## 2012-06-09 DIAGNOSIS — E119 Type 2 diabetes mellitus without complications: Secondary | ICD-10-CM | POA: Diagnosis not present

## 2012-06-09 LAB — BASIC METABOLIC PANEL
BUN: 27 mg/dL — ABNORMAL HIGH (ref 6–23)
CO2: 31 mEq/L (ref 19–32)
Calcium: 9.1 mg/dL (ref 8.4–10.5)
Chloride: 103 mEq/L (ref 96–112)
Creatinine, Ser: 1.1 mg/dL (ref 0.4–1.2)
GFR: 48.71 mL/min — ABNORMAL LOW (ref >60.00)
Glucose, Bld: 152 mg/dL — ABNORMAL HIGH (ref 70–99)
Potassium: 3.7 mEq/L (ref 3.5–5.1)
Sodium: 141 mEq/L (ref 135–145)

## 2012-06-09 LAB — HEMOGLOBIN A1C: Hgb A1c MFr Bld: 7 % — ABNORMAL HIGH (ref 4.6–6.5)

## 2012-06-12 DIAGNOSIS — Z1231 Encounter for screening mammogram for malignant neoplasm of breast: Secondary | ICD-10-CM | POA: Diagnosis not present

## 2012-06-12 LAB — HM MAMMOGRAPHY: HM Mammogram: NEGATIVE

## 2012-06-16 ENCOUNTER — Ambulatory Visit (INDEPENDENT_AMBULATORY_CARE_PROVIDER_SITE_OTHER): Payer: Medicare Other | Admitting: Internal Medicine

## 2012-06-16 ENCOUNTER — Encounter: Payer: Self-pay | Admitting: Internal Medicine

## 2012-06-16 VITALS — BP 128/80 | Temp 97.7°F | Wt 161.0 lb

## 2012-06-16 DIAGNOSIS — H8190 Unspecified disorder of vestibular function, unspecified ear: Secondary | ICD-10-CM

## 2012-06-16 DIAGNOSIS — E119 Type 2 diabetes mellitus without complications: Secondary | ICD-10-CM | POA: Diagnosis not present

## 2012-06-16 DIAGNOSIS — L84 Corns and callosities: Secondary | ICD-10-CM | POA: Diagnosis not present

## 2012-06-16 DIAGNOSIS — R42 Dizziness and giddiness: Secondary | ICD-10-CM | POA: Diagnosis not present

## 2012-06-16 DIAGNOSIS — H8390 Unspecified disease of inner ear, unspecified ear: Secondary | ICD-10-CM

## 2012-06-16 DIAGNOSIS — Z79899 Other long term (current) drug therapy: Secondary | ICD-10-CM

## 2012-06-16 MED ORDER — DIAZEPAM 2 MG PO TABS: ORAL_TABLET | ORAL | Status: DC

## 2012-06-16 NOTE — Progress Notes (Signed)
Chief Complaint  Patient presents with  . Follow-up    mutiple problems   . Dizziness    hx of same  . Diabetes    HPI:  Patient comes in today for follow up of  multiple medical problems.  Since last visit  Eye infection  ? allergy  And infection very itchy .   Using vaseline on eye lids. Has seen yey doc in past .  No vision change with this  Patient asks for  Valium to use as needed as dr Joni Fears prescribed this with help and pt states doesn't make her fall but actually helps her intermittent vertigo and  prevents her falling  She takes a med and then lays down and then is fine. She states that Valium "helps dizzy inner ear ."   As requested has  Tried antivert no help.   Episodes   Happens   Out in wind.   And to beauty . Shop [place] . Used 1/4 of a 5 mg in the past?  DM good sugars  Has corn on foot and using sanding not getting better no pain now no ulcers   ROS: See pertinent positives and negatives per HPI.no cp sob falling bleeding   Past Medical History  Diagnosis Date  . Arthritis   . Diabetes mellitus   . Colon polyps   . Diverticulosis   . Hemorrhoids   . Dyslipidemia   . GERD (gastroesophageal reflux disease)     Family History  Problem Relation Age of Onset  . Colon cancer Neg Hx   . Stomach cancer Neg Hx   . Coronary artery disease Brother     MI in his 37s    History   Social History  . Marital Status: Married    Spouse Name: N/A    Number of Children: 2  . Years of Education: N/A   Social History Main Topics  . Smoking status: Never Smoker   . Smokeless tobacco: Never Used  . Alcohol Use: No  . Drug Use: No  . Sexually Active: No   Other Topics Concern  . None   Social History Narrative   HH 2 marriedWorks for [employer] for 40 years retiredNo pets     Outpatient Encounter Prescriptions as of 06/16/2012  Medication Sig Dispense Refill  . Ascorbic Acid (VITAMIN C) 100 MG tablet Take 100 mg by mouth daily.        Marland Kitchen aspirin 81 MG  chewable tablet Chew 81 mg by mouth daily.        Marland Kitchen atorvastatin (LIPITOR) 10 MG tablet Take 1 tablet (10 mg total) by mouth daily. qd1  90 tablet  3  . Biotin 1000 MCG tablet Take 1,000 mcg by mouth daily.      . Blood Glucose Calibration (OT ULTRA/FASTTK CNTRL SOLN) SOLN Use as instructed  1 each  3  . Calcium Carbonate-Vitamin D (CALCIUM 500 + D PO) Take by mouth. 3 days         . docusate sodium (COLACE) 100 MG capsule Take 100 mg by mouth daily.       Marland Kitchen esomeprazole (NEXIUM) 40 MG capsule Take 1 capsule (40 mg total) by mouth daily before breakfast.  90 capsule  3  . estradiol (ESTRACE) 1 MG tablet Take 0.5 tablets (0.5 mg total) by mouth daily.  90 tablet  3  . fish oil-omega-3 fatty acids 1000 MG capsule Take by mouth daily.        Marland Kitchen  furosemide (LASIX) 40 MG tablet Take 1 tablet (40 mg total) by mouth daily. For edema  90 tablet  3  . glipiZIDE (GLUCOTROL XL) 5 MG 24 hr tablet Take 1 tablet (5 mg total) by mouth daily.  90 tablet  3  . glucose blood (ONE TOUCH ULTRA TEST) test strip Use as instructed;  100 each  3  . Lancets (ONETOUCH ULTRASOFT) lancets Use as instructed  100 each  11  . vitamin B-12 (CYANOCOBALAMIN) 1000 MCG tablet Take 1,000 mcg by mouth daily.      . Vitamin D, Cholecalciferol, 400 UNITS CHEW Chew by mouth.        . diazepam (VALIUM) 2 MG tablet Take one as needed for vertigo can repeat x 1 if needed maximum 3 per day.  30 tablet  1    EXAM:  BP 128/80  Temp 97.7 F (36.5 C) (Oral)  Wt 161 lb (73.029 kg)  There is no height on file to calculate BMI.  GENERAL: vitals reviewed and listed above, alert, oriented, appears well hydrated and in no acute distress  HEENT: atraumatic, conjunctiva  clear,  Eyelids rubbed right  No discharge or rash no obvious abnormalities on inspection of external nose and ears OP : no lesion edema or exudate   NECK: no obvious masses on inspection palpation   LUNGS: clear to auscultation bilaterally, no wheezes, rales or rhonchi,  good air movement  CV: HRRR, no clubbing cyanosis or  peripheral edema nl cap refill  Feet in good repair  MS: moves all extremities without noticeable focal  Abnormality gait seems reasonable steady and unassisted   Non focal neuro exam  Foot with 2-3 mm  corn at 4th metatarsal no redness or ulcers  PSYCH: pleasant and cooperative, no obvious depression or anxiety Lab Results  Component Value Date   WBC 4.2* 12/03/2011   HGB 12.2 12/03/2011   HCT 36.5 12/03/2011   PLT 197.0 12/03/2011   GLUCOSE 152* 06/09/2012   CHOL 168 12/03/2011   TRIG 210.0* 12/03/2011   HDL 45.10 12/03/2011   LDLDIRECT 91.7 12/03/2011   ALT 14 12/03/2011   AST 16 12/03/2011   NA 141 06/09/2012   K 3.7 06/09/2012   CL 103 06/09/2012   CREATININE 1.1 06/09/2012   BUN 27* 06/09/2012   CO2 31 06/09/2012   TSH 1.81 12/03/2011   INR 1.0 02/15/2009   HGBA1C 7.0* 06/09/2012   MICROALBUR 0.8 12/03/2011    ASSESSMENT AND PLAN:  Discussed the following assessment and plan:  1. DIABETES MELLITUS, TYPE II, CONTROLLED   2. Dysfunction of inner ear per pt report     used valium in past with help   3. Vertigo recurrent episodic     recurrent  for years  per pt no progression states valium works for heras needed use 2-3 x per week max.  4. Corn of foot    appears minor but has risk  patient is aware of risk of valium and my concerns and my  hesitation to prescribe because of risk of this med  . She is certain  that this decreases her risk of falling by hx . And is emphatic that this works the best. As in the past.  Will prescribe  with caution. And follow  Not to drive or undertake other risk movements if on this med.   Not felt that balance to helpful and she states doesn't have menieres sx .  Or other neuro sx.  -Patient advised to  return or notify health care team  immediately if symptoms worsen or persist or new concerns arise.  Patient Instructions  Check bp  Readings at home.  To ensure ok Check  glucose readings  About 2  hours after eating occassionally to make sure not going to high ,  Can use valium with caution for the vertigo sx .  Trial of 2 mg  As needed  And contact hour office bout how this is doing .   These meds can cause risk of falling or mental fogginess.   Check you r feet soak before treating the corn on your foot.   Labs and rov in 4 months or so .    Standley Brooking. Panosh M.D.

## 2012-06-16 NOTE — Patient Instructions (Signed)
Check bp  Readings at home.  To ensure ok Check  glucose readings  About 2 hours after eating occassionally to make sure not going to high ,  Can use valium with caution for the vertigo sx .  Trial of 2 mg  As needed  And contact hour office bout how this is doing .   These meds can cause risk of falling or mental fogginess.   Check you r feet soak before treating the corn on your foot.   Labs and rov in 4 months or so .

## 2012-08-15 ENCOUNTER — Ambulatory Visit (INDEPENDENT_AMBULATORY_CARE_PROVIDER_SITE_OTHER): Payer: Medicare Other | Admitting: Emergency Medicine

## 2012-08-15 VITALS — BP 158/78 | HR 98 | Temp 98.0°F | Resp 16 | Ht 62.5 in | Wt 165.0 lb

## 2012-08-15 DIAGNOSIS — B029 Zoster without complications: Secondary | ICD-10-CM | POA: Diagnosis not present

## 2012-08-15 MED ORDER — VALACYCLOVIR HCL 1 G PO TABS: 1000.0000 mg | ORAL_TABLET | Freq: Three times a day (TID) | ORAL | Status: DC

## 2012-08-15 MED ORDER — HYDROCODONE-ACETAMINOPHEN 5-500 MG PO TABS: 1.0000 | ORAL_TABLET | ORAL | Status: DC | PRN

## 2012-08-15 NOTE — Patient Instructions (Addendum)

## 2012-08-15 NOTE — Progress Notes (Signed)
[facility] [address], [place] [address] [phone]  Date:  08/15/2012   Name:  [name]   DOB:  March 30, 1930   MRN:  [id number]  PCP:  Lottie Dawson, MD    Chief Complaint: Rash   History of Present Illness:  [name] is a 79 y.o. very pleasant female patient who presents with the following:  Rash on left shoulder and back.  History of chicken pox.  No fever or chills.  Rash present for one week.  No other complaints.  Rash burns and itches  Patient Active Problem List  Diagnosis  . HYPERTHYROIDISM  . HYPOTHYROIDISM  . DIABETES MELLITUS, TYPE II, CONTROLLED  . VITAMIN D DEFICIENCY  . HYPERLIPIDEMIA  . EXOGENOUS OBESITY  . ANEMIA  . ANXIETY DEPRESSION  . GERD  . ARTHRITIS  . OSTEOPENIA  . TOTAL KNEE REPLACEMENT, LEFT, HX OF  . Chest pressure  . Itching  mid back  . Abnormal EKG  . Medicare annual wellness visit, subsequent  . DJD (degenerative joint disease)  . Chronic sore throat  . Hoarse  . Nonspecific abnormal unspecified cardiovascular function study    Past Medical History  Diagnosis Date  . Arthritis   . Diabetes mellitus   . Colon polyps   . Diverticulosis   . Hemorrhoids   . Dyslipidemia   . GERD (gastroesophageal reflux disease)     Past Surgical History  Procedure Laterality Date  . Joint replacement  2010    shoulder  . Left knee replacement left  2010  . Right knee replacedment  2003  . Umbilical hernia repair  1958  . Vaginal hysterectomy  1975  . Carpal tunnel release  1977    right hand  . Cataract extraction w/ intraocular lens  implant, bilateral      left 1996; right 1997  . Ganglion cyst excision  2003    right hand    History  Substance Use Topics  . Smoking status: Never Smoker   . Smokeless tobacco: Never Used  . Alcohol Use: No    Family History  Problem Relation Age of Onset  . Colon cancer Neg Hx   . Stomach cancer Neg Hx   . Coronary artery disease Brother     MI  in his 66s    No Known Allergies  Medication list has been reviewed and updated.  Current Outpatient Prescriptions on File Prior to Visit  Medication Sig Dispense Refill  . Ascorbic Acid (VITAMIN C) 100 MG tablet Take 100 mg by mouth daily.        Marland Kitchen aspirin 81 MG chewable tablet Chew 81 mg by mouth daily.        Marland Kitchen atorvastatin (LIPITOR) 10 MG tablet Take 1 tablet (10 mg total) by mouth daily. qd1  90 tablet  3  . Biotin 1000 MCG tablet Take 1,000 mcg by mouth daily.      . Blood Glucose Calibration (OT ULTRA/FASTTK CNTRL SOLN) SOLN Use as instructed  1 each  3  . Calcium Carbonate-Vitamin D (CALCIUM 500 + D PO) Take by mouth. 3 days         . diazepam (VALIUM) 2 MG tablet Take one as needed for vertigo can repeat x 1 if needed maximum 3 per day.  30 tablet  1  . docusate sodium (COLACE) 100 MG capsule Take 100 mg by mouth daily.       Marland Kitchen esomeprazole (NEXIUM) 40 MG capsule  Take 1 capsule (40 mg total) by mouth daily before breakfast.  90 capsule  3  . estradiol (ESTRACE) 1 MG tablet Take 0.5 tablets (0.5 mg total) by mouth daily.  90 tablet  3  . fish oil-omega-3 fatty acids 1000 MG capsule Take by mouth daily.        . furosemide (LASIX) 40 MG tablet Take 1 tablet (40 mg total) by mouth daily. For edema  90 tablet  3  . glipiZIDE (GLUCOTROL XL) 5 MG 24 hr tablet Take 1 tablet (5 mg total) by mouth daily.  90 tablet  3  . glucose blood (ONE TOUCH ULTRA TEST) test strip Use as instructed;  100 each  3  . Lancets (ONETOUCH ULTRASOFT) lancets Use as instructed  100 each  11  . vitamin B-12 (CYANOCOBALAMIN) 1000 MCG tablet Take 1,000 mcg by mouth daily.      . Vitamin D, Cholecalciferol, 400 UNITS CHEW Chew by mouth.         No current facility-administered medications on file prior to visit.    Review of Systems:  As per HPI, otherwise negative.    Physical Examination: Filed Vitals:   08/15/12 1320  BP: 158/78  Pulse: 98  Temp: 98 F (36.7 C)  Resp: 16   Filed Vitals:    08/15/12 1320  Height: 5' 2.5" (1.588 m)  Weight: 165 lb (74.844 kg)   Body mass index is 29.68 kg/(m^2). Ideal Body Weight: Weight in (lb) to have BMI = 25: 138.6   GEN: WDWN, NAD, Non-toxic, Alert & Oriented x 3 HEENT: Atraumatic, Normocephalic.  Ears and Nose: No external deformity. EXTR: No clubbing/cyanosis/edema NEURO: Normal gait.  PSYCH: Normally interactive. Conversant. Not depressed or anxious appearing.  Calm demeanor.  BACK:  Rash characteristic of shingles  Assessment and Plan: Shingles famvir vicodin  Roselee Culver, MD

## 2012-09-21 ENCOUNTER — Telehealth: Payer: Self-pay | Admitting: Internal Medicine

## 2012-09-21 NOTE — Telephone Encounter (Signed)
Pt and her husband came in last year on 12/03/11 and both had physicals. Pt would like to do that again this year, but no appt available. They will come in separate in they have to. But still none available except well child in the am. Pt will come in fasting. Medicare wellness.  Pls advise.

## 2012-09-24 NOTE — Telephone Encounter (Signed)
Pt decline 12-02-12. Pt will be here on 12-09-12 with her spouse for cpxs

## 2012-09-24 NOTE — Telephone Encounter (Signed)
May 28 is open

## 2012-10-12 ENCOUNTER — Telehealth: Payer: Self-pay | Admitting: Internal Medicine

## 2012-10-12 DIAGNOSIS — M129 Arthropathy, unspecified: Secondary | ICD-10-CM

## 2012-10-12 DIAGNOSIS — E119 Type 2 diabetes mellitus without complications: Secondary | ICD-10-CM

## 2012-10-12 DIAGNOSIS — E785 Hyperlipidemia, unspecified: Secondary | ICD-10-CM

## 2012-10-12 DIAGNOSIS — E039 Hypothyroidism, unspecified: Secondary | ICD-10-CM

## 2012-10-12 DIAGNOSIS — D649 Anemia, unspecified: Secondary | ICD-10-CM

## 2012-10-12 NOTE — Telephone Encounter (Addendum)
Could you put in labs for pt for her CPX? I scheduled both he and his wife on 6/18 and labs 6/11. Thank you!

## 2012-10-13 DIAGNOSIS — H9110 Presbycusis, unspecified ear: Secondary | ICD-10-CM | POA: Diagnosis not present

## 2012-10-13 DIAGNOSIS — H905 Unspecified sensorineural hearing loss: Secondary | ICD-10-CM | POA: Diagnosis not present

## 2012-10-13 DIAGNOSIS — H903 Sensorineural hearing loss, bilateral: Secondary | ICD-10-CM | POA: Diagnosis not present

## 2012-10-13 DIAGNOSIS — H6120 Impacted cerumen, unspecified ear: Secondary | ICD-10-CM | POA: Diagnosis not present

## 2012-10-20 NOTE — Telephone Encounter (Signed)
Done  Didn't put in date  Just future orders

## 2012-10-21 NOTE — Telephone Encounter (Signed)
Thank you :)

## 2012-10-26 DIAGNOSIS — H43819 Vitreous degeneration, unspecified eye: Secondary | ICD-10-CM | POA: Diagnosis not present

## 2012-10-26 DIAGNOSIS — H04129 Dry eye syndrome of unspecified lacrimal gland: Secondary | ICD-10-CM | POA: Diagnosis not present

## 2012-10-26 DIAGNOSIS — E119 Type 2 diabetes mellitus without complications: Secondary | ICD-10-CM | POA: Diagnosis not present

## 2012-10-26 DIAGNOSIS — Z961 Presence of intraocular lens: Secondary | ICD-10-CM | POA: Diagnosis not present

## 2012-10-26 DIAGNOSIS — H40019 Open angle with borderline findings, low risk, unspecified eye: Secondary | ICD-10-CM | POA: Diagnosis not present

## 2012-10-26 LAB — HM DIABETES EYE EXAM

## 2012-10-27 ENCOUNTER — Encounter: Payer: Self-pay | Admitting: Internal Medicine

## 2012-12-09 ENCOUNTER — Encounter: Payer: Medicare Other | Admitting: Internal Medicine

## 2012-12-16 ENCOUNTER — Other Ambulatory Visit (INDEPENDENT_AMBULATORY_CARE_PROVIDER_SITE_OTHER): Payer: Medicare Other

## 2012-12-16 DIAGNOSIS — E119 Type 2 diabetes mellitus without complications: Secondary | ICD-10-CM | POA: Diagnosis not present

## 2012-12-16 DIAGNOSIS — D649 Anemia, unspecified: Secondary | ICD-10-CM | POA: Diagnosis not present

## 2012-12-16 DIAGNOSIS — E785 Hyperlipidemia, unspecified: Secondary | ICD-10-CM | POA: Diagnosis not present

## 2012-12-16 DIAGNOSIS — M129 Arthropathy, unspecified: Secondary | ICD-10-CM

## 2012-12-16 DIAGNOSIS — E039 Hypothyroidism, unspecified: Secondary | ICD-10-CM | POA: Diagnosis not present

## 2012-12-16 LAB — BASIC METABOLIC PANEL
BUN: 31 mg/dL — ABNORMAL HIGH (ref 6–23)
CO2: 29 mEq/L (ref 19–32)
Calcium: 9.8 mg/dL (ref 8.4–10.5)
Chloride: 103 mEq/L (ref 96–112)
Creatinine, Ser: 1.6 mg/dL — ABNORMAL HIGH (ref 0.4–1.2)
GFR: 33.13 mL/min — ABNORMAL LOW (ref >60.00)
Glucose, Bld: 147 mg/dL — ABNORMAL HIGH (ref 70–99)
Potassium: 4.2 mEq/L (ref 3.5–5.1)
Sodium: 140 mEq/L (ref 135–145)

## 2012-12-16 LAB — LDL CHOLESTEROL, DIRECT: Direct LDL: 94.8 mg/dL

## 2012-12-16 LAB — LIPID PANEL
Cholesterol: 171 mg/dL (ref 0–200)
HDL: 39.9 mg/dL (ref >39.00)
Total CHOL/HDL Ratio: 4
Triglycerides: 216 mg/dL — ABNORMAL HIGH (ref 0.0–149.0)
VLDL: 43.2 mg/dL — ABNORMAL HIGH (ref 0.0–40.0)

## 2012-12-16 LAB — CBC WITH DIFFERENTIAL/PLATELET
Basophils Absolute: 0 10*3/uL (ref 0.0–0.1)
Basophils Relative: 0.7 % (ref 0.0–3.0)
Eosinophils Absolute: 0.1 10*3/uL (ref 0.0–0.7)
Eosinophils Relative: 3 % (ref 0.0–5.0)
HCT: 39.4 % (ref 36.0–46.0)
Hemoglobin: 13.4 g/dL (ref 12.0–15.0)
Lymphocytes Relative: 39.9 % (ref 12.0–46.0)
Lymphs Abs: 1.8 10*3/uL (ref 0.7–4.0)
MCHC: 33.9 g/dL (ref 30.0–36.0)
MCV: 91.1 fl (ref 78.0–100.0)
Monocytes Absolute: 0.4 10*3/uL (ref 0.1–1.0)
Monocytes Relative: 8.1 % (ref 3.0–12.0)
Neutro Abs: 2.2 10*3/uL (ref 1.4–7.7)
Neutrophils Relative %: 48.3 % (ref 43.0–77.0)
Platelets: 233 10*3/uL (ref 150.0–400.0)
RBC: 4.33 Mil/uL (ref 3.87–5.11)
RDW: 14 % (ref 11.5–14.6)
WBC: 4.5 10*3/uL (ref 4.5–10.5)

## 2012-12-16 LAB — HEPATIC FUNCTION PANEL
ALT: 16 U/L (ref 0–35)
AST: 20 U/L (ref 0–37)
Albumin: 3.7 g/dL (ref 3.5–5.2)
Alkaline Phosphatase: 66 U/L (ref 39–117)
Bilirubin, Direct: 0 mg/dL (ref 0.0–0.3)
Total Bilirubin: 0.8 mg/dL (ref 0.3–1.2)
Total Protein: 7.3 g/dL (ref 6.0–8.3)

## 2012-12-16 LAB — HEMOGLOBIN A1C: Hgb A1c MFr Bld: 6.7 % — ABNORMAL HIGH (ref 4.6–6.5)

## 2012-12-16 LAB — TSH: TSH: 3.38 u[IU]/mL (ref 0.35–5.50)

## 2012-12-23 ENCOUNTER — Ambulatory Visit (INDEPENDENT_AMBULATORY_CARE_PROVIDER_SITE_OTHER): Payer: Medicare Other | Admitting: Internal Medicine

## 2012-12-23 ENCOUNTER — Encounter: Payer: Self-pay | Admitting: Internal Medicine

## 2012-12-23 VITALS — BP 114/62 | HR 73 | Temp 98.1°F | Ht 61.25 in | Wt 161.0 lb

## 2012-12-23 DIAGNOSIS — N289 Disorder of kidney and ureter, unspecified: Secondary | ICD-10-CM | POA: Insufficient documentation

## 2012-12-23 DIAGNOSIS — Z974 Presence of external hearing-aid: Secondary | ICD-10-CM | POA: Insufficient documentation

## 2012-12-23 DIAGNOSIS — E119 Type 2 diabetes mellitus without complications: Secondary | ICD-10-CM

## 2012-12-23 DIAGNOSIS — M1990 Unspecified osteoarthritis, unspecified site: Secondary | ICD-10-CM | POA: Diagnosis not present

## 2012-12-23 DIAGNOSIS — Z8619 Personal history of other infectious and parasitic diseases: Secondary | ICD-10-CM | POA: Insufficient documentation

## 2012-12-23 DIAGNOSIS — Z79890 Hormone replacement therapy (postmenopausal): Secondary | ICD-10-CM | POA: Diagnosis not present

## 2012-12-23 DIAGNOSIS — Z9889 Other specified postprocedural states: Secondary | ICD-10-CM

## 2012-12-23 DIAGNOSIS — E785 Hyperlipidemia, unspecified: Secondary | ICD-10-CM | POA: Insufficient documentation

## 2012-12-23 DIAGNOSIS — Z0000 Encounter for general adult medical examination without abnormal findings: Secondary | ICD-10-CM | POA: Diagnosis not present

## 2012-12-23 DIAGNOSIS — Z780 Asymptomatic menopausal state: Secondary | ICD-10-CM

## 2012-12-23 MED ORDER — GLIPIZIDE ER 5 MG PO TB24: 5.0000 mg | ORAL_TABLET | Freq: Every day | ORAL | Status: DC

## 2012-12-23 MED ORDER — ESTRADIOL 1 MG PO TABS: 0.5000 mg | ORAL_TABLET | Freq: Every day | ORAL | Status: DC

## 2012-12-23 MED ORDER — ONETOUCH ULTRASOFT LANCETS MISC: Status: DC

## 2012-12-23 MED ORDER — ONETOUCH ULTRA CONTROL VI SOLN: Status: DC

## 2012-12-23 MED ORDER — ATORVASTATIN CALCIUM 10 MG PO TABS: 10.0000 mg | ORAL_TABLET | Freq: Every day | ORAL | Status: DC

## 2012-12-23 MED ORDER — DIAZEPAM 2 MG PO TABS: ORAL_TABLET | ORAL | Status: DC

## 2012-12-23 MED ORDER — GLUCOSE BLOOD VI STRP: ORAL_STRIP | Status: DC

## 2012-12-23 MED ORDER — FUROSEMIDE 40 MG PO TABS: 40.0000 mg | ORAL_TABLET | Freq: Every day | ORAL | Status: DC

## 2012-12-23 MED ORDER — ESOMEPRAZOLE MAGNESIUM 40 MG PO CPDR: 40.0000 mg | DELAYED_RELEASE_CAPSULE | Freq: Every day | ORAL | Status: DC

## 2012-12-23 NOTE — Patient Instructions (Addendum)
Continue lifestyle intervention healthy eating and exercise . Diabetes is doing well.   Try weaning  Off the estrogen to  Get off as this is a risk for  Stroke  And clotting  .    Only take the valium with caution as it is a risk for falling .   Use some hydrocortisone on the itchy dry skin patch on the back  Up to 2-3 x per day.   You kidney function is  Decreased recently  Check lab in a month . If ok then plan rehceck with lab and diabetes check in 6 months or as needed   Check into cost of tetanus booster   And we can give any time  Medicare may not pay for this vaccine unless an injury.

## 2012-12-23 NOTE — Progress Notes (Signed)
Chief Complaint  Patient presents with  . Medicare Wellness  . Diabetes  . Hyperlipidemia    HPI: Patient comes in today for Preventive Medicare wellness visit . And followup of multiple disease management. No major injuries, ed visits ,hospitalizations , new medications since last visit. She did have shingles in this winter and was seen at the urgent care is a lot better but still has a little rash near its itchiness in her upper back and middle back for a long time. Puts a lot of lotions on it. Had cardiology and stress test evaluation no acute findings option of doing a catheterization because of indeterminate breast attenuation on the scan and she has no symptoms and it's been a year and decided not to get the catheter Diabetes blood sugars are good without lows occasionally going up higher recently. No side effects of medications. No retinopathy by history.  Her hearing is getting worse she is getting hearing aids readjusted.  In regard to her dizziness anxiety she only occasionally takes it does diazepam she took one a few weeks ago.  Taking Lipitor needs refill Has been on estradiol for many years needs a refill hasn't really tried to wean off. GI continues on Nexium. She takes a number of vitamin supplements and asks about these. As she is her they're supposed to be good for your health. She has not had a vitamin prescribed by an eye doctor   Hearing: Getting hearing aids  Vision:  No limitations at present . Last eye check UTD has glasses  Safety:  Has smoke detector and wears seat belts.  No firearms. No excess sun exposure. Sees dentist regularly.  Falls: None recently  Advance directive :  Reviewed    Memory: Felt to be good  , no concern from her or her family.  Depression: No anhedonia unusual crying or depressive symptoms  Nutrition: Eats well balanced diet; adequate calcium and vitamin D. No swallowing chewing problems.  Injury: no major injuries in the last  six months.  Other healthcare providers:  Reviewed today .  Social:  Lives with spouse married. No pets.   Preventive parameters: up-to-date  Reviewed except for tetanus  ADLS:   There are no problems or need for assistance  driving, feeding, obtaining food, dressing, toileting and bathing, managing money using phone. She is independent.  EXERCISE/ HABITS  Per week   No tobacco   or etoh   ROS: Breasts are sore a lot no acute changes or nodules GEN/ HEENT: No fever, significant weight changes sweats headaches vision problems hearing changes, CV/ PULM; No chest pain shortness of breath cough, syncope,edema  change in exercise tolerance. GI /GU: No adominal pain, vomiting, change in bowel habits. No blood in the stool. No significant GU symptoms. SKIN/HEME: ,no acute skin rashes suspicious lesions or bleeding. No lymphadenopathy, nodules, masses.  NEURO/ PSYCH:  No neurologic signs such as weakness numbness. No depression anxiety. IMM/ Allergy: No unusual infections.  Allergy .   REST of 12 system review negative except as per HPI   Past Medical History  Diagnosis Date  . Arthritis   . Diabetes mellitus   . Colon polyps   . Diverticulosis   . Hemorrhoids   . Dyslipidemia   . GERD (gastroesophageal reflux disease)     Family History  Problem Relation Age of Onset  . Colon cancer Neg Hx   . Stomach cancer Neg Hx   . Coronary artery disease Brother     MI  in his 52s    History   Social History  . Marital Status: Married    Spouse Name: N/A    Number of Children: 2  . Years of Education: N/A   Social History Main Topics  . Smoking status: Never Smoker   . Smokeless tobacco: Never Used  . Alcohol Use: No  . Drug Use: No  . Sexually Active: No   Other Topics Concern  . None   Social History Narrative   HH 2 married   Works for [employer] for 40 years retired   No pets     Outpatient Encounter Prescriptions as of 12/23/2012  Medication Sig Dispense Refill  .  Ascorbic Acid (VITAMIN C) 100 MG tablet Take 100 mg by mouth daily.        Marland Kitchen aspirin 81 MG chewable tablet Chew 81 mg by mouth daily.        Marland Kitchen atorvastatin (LIPITOR) 10 MG tablet Take 1 tablet (10 mg total) by mouth daily. qd1  90 tablet  3  . Blood Glucose Calibration (OT ULTRA/FASTTK CNTRL SOLN) SOLN Use as instructed  1 each  11  . Calcium Carbonate-Vitamin D (CALCIUM 500 + D PO) Take by mouth 2 (two) times daily.       . Cholecalciferol (VITAMIN D3) 2000 UNITS TABS Take by mouth. Taking 3 times weekly      . diazepam (VALIUM) 2 MG tablet Take one as needed for vertigo can repeat x 1 if needed maximum 3 per day.  30 tablet  0  . docusate sodium (COLACE) 100 MG capsule Take 100 mg by mouth daily.       Marland Kitchen esomeprazole (NEXIUM) 40 MG capsule Take 1 capsule (40 mg total) by mouth daily before breakfast.  90 capsule  3  . estradiol (ESTRACE) 1 MG tablet Take 0.5 tablets (0.5 mg total) by mouth daily.  90 tablet  3  . fish oil-omega-3 fatty acids 1000 MG capsule Take by mouth daily.        . furosemide (LASIX) 40 MG tablet Take 1 tablet (40 mg total) by mouth daily. For edema  90 tablet  3  . glipiZIDE (GLUCOTROL XL) 5 MG 24 hr tablet Take 1 tablet (5 mg total) by mouth daily.  90 tablet  3  . Lancets (ONETOUCH ULTRASOFT) lancets Use as instructed  100 each  11  . pyridOXINE (VITAMIN B-6) 100 MG tablet Take 100 mg by mouth daily.      Marland Kitchen thiamine (VITAMIN B-1) 100 MG tablet Take 100 mg by mouth daily.      . [DISCONTINUED] atorvastatin (LIPITOR) 10 MG tablet Take 1 tablet (10 mg total) by mouth daily. qd1  90 tablet  3  . [DISCONTINUED] Blood Glucose Calibration (OT ULTRA/FASTTK CNTRL SOLN) SOLN Use as instructed  1 each  3  . [DISCONTINUED] Blood Glucose Calibration (OT ULTRA/FASTTK CNTRL SOLN) SOLN Use as instructed  1 each  11  . [DISCONTINUED] diazepam (VALIUM) 2 MG tablet Take one as needed for vertigo can repeat x 1 if needed maximum 3 per day.  30 tablet  1  . [DISCONTINUED] esomeprazole  (NEXIUM) 40 MG capsule Take 1 capsule (40 mg total) by mouth daily before breakfast.  90 capsule  3  . [DISCONTINUED] estradiol (ESTRACE) 1 MG tablet Take 0.5 tablets (0.5 mg total) by mouth daily.  90 tablet  3  . [DISCONTINUED] furosemide (LASIX) 40 MG tablet Take 1 tablet (40 mg total) by mouth daily. For edema  90 tablet  3  . [DISCONTINUED] glipiZIDE (GLUCOTROL XL) 5 MG 24 hr tablet Take 1 tablet (5 mg total) by mouth daily.  90 tablet  3  . [DISCONTINUED] Lancets (ONETOUCH ULTRASOFT) lancets Use as instructed  100 each  11  . [DISCONTINUED] Lancets (ONETOUCH ULTRASOFT) lancets Use as instructed  100 each  11  . glucose blood (ONE TOUCH ULTRA TEST) test strip Use as instructed;  100 each  11  . HYDROcodone-acetaminophen (VICODIN) 5-500 MG per tablet Take 1 tablet by mouth every 4 (four) hours as needed for pain.  20 tablet  0  . valACYclovir (VALTREX) 1000 MG tablet Take 1 tablet (1,000 mg total) by mouth 3 (three) times daily.  30 tablet  0  . vitamin B-12 (CYANOCOBALAMIN) 1000 MCG tablet Take 1,000 mcg by mouth daily.      . [DISCONTINUED] Biotin 1000 MCG tablet Take 1,000 mcg by mouth daily.      . [DISCONTINUED] glucose blood (ONE TOUCH ULTRA TEST) test strip Use as instructed;  100 each  3  . [DISCONTINUED] glucose blood (ONE TOUCH ULTRA TEST) test strip Use as instructed;  100 each  11  . [DISCONTINUED] Vitamin D, Cholecalciferol, 400 UNITS CHEW Chew by mouth.         No facility-administered encounter medications on file as of 12/23/2012.    EXAM:  BP 114/62  Pulse 73  Temp(Src) 98.1 F (36.7 C) (Oral)  Ht 5' 1.25" (1.556 m)  Wt 161 lb (73.029 kg)  BMI 30.16 kg/m2  SpO2 95%  Body mass index is 30.16 kg/(m^2).  Physical Exam: Vital signs reviewed [id number] is a well-developed well-nourished alert cooperative   who appears stated age in no acute distress.  HEENT: normocephalic atraumatic , Eyes: PERRL EOM's full, conjunctiva clear, Nares: paten,t no deformity discharge or  tenderness., Ears: no deformity EAC's clear TMs with normal landmarks. Mouth: clear OP, no lesions, edema.  Moist mucous membranes. Dentition in adequate repair. NECK: supple without masses, thyromegaly or bruits. CHEST/PULM:  Clear to auscultation and percussion breath sounds equal no wheeze , rales or rhonchi. No chest wall deformities or tenderness. Breast: normal by inspection . No dimpling, discharge, masses, tenderness or discharge . CV: PMI is nondisplaced, S1 S2 no gallops, murmurs, rubs. Peripheral pulses are full without delay.No JVD .  ABDOMEN: Bowel sounds normal nontender  No guard or rebound, no hepato splenomegal no CVA tenderness.  No hernia. Extremtities:  No clubbing cyanosis or edema, no acute joint swelling or redness no focal atrophy NEURO:  Oriented x3, cranial nerves 3-12 appear to be intact, no obvious focal weakness,gait within normal limits no abnormal reflexes or asymmetrical SKIN: No acute rashes normal turgor, color, no bruising or petechiae. Large flesh-colored tried scaly area middle back. Feeding pigment area of shingles left shoulder. Right ear lobe small 2-3 mm scaly round area PSYCH: Oriented, good eye contact, no obvious depression anxiety, cognition and judgment appear normal. LN: no cervical axillary inguinal adenopathy No noted deficits in memory, attention, and speech.   Lab Results  Component Value Date   WBC 4.5 12/16/2012   HGB 13.4 12/16/2012   HCT 39.4 12/16/2012   PLT 233.0 12/16/2012   GLUCOSE 147* 12/16/2012   CHOL 171 12/16/2012   TRIG 216.0* 12/16/2012   HDL 39.90 12/16/2012   LDLDIRECT 94.8 12/16/2012   ALT 16 12/16/2012   AST 20 12/16/2012   NA 140 12/16/2012   K 4.2 12/16/2012   CL 103 12/16/2012   CREATININE 1.6*  12/16/2012   BUN 31* 12/16/2012   CO2 29 12/16/2012   TSH 3.38 12/16/2012   INR 1.0 02/15/2009   HGBA1C 6.7* 12/16/2012   MICROALBUR 0.8 12/03/2011    ASSESSMENT AND PLAN:  Discussed the following assessment and plan:  Medicare  annual wellness visit, subsequent  DIABETES MELLITUS, TYPE II, CONTROLLED  Postmenopausal HRT (hormone replacement therapy) - Discussed weaning risk may be higher than benefit of estrogen therapy but okay to refill today  HYPERLIPIDEMIA  DJD (degenerative joint disease)  Hyperlipidemia - Plan: atorvastatin (LIPITOR) 10 MG tablet  Post-menopause - Plan: estradiol (ESTRACE) 1 MG tablet  Renal insufficiency - Creatinine elevated today 1.6 as opposed to 1.1 1.2 no obvious insight worse blood pressure good recheck in a month  History of shingles  Wears hearing aid Discussed with patient caution with supplementation and high-dose vitamins as they can cause negative effects as well as positive effects suggest she does get a multivitamin with B. vitamins in it. She's not taking anti-inflammatories and to avoid these does take Tylenol for pain if needed. Tetanus vaccine night usually covered by Medicare and less than injuries she can check into insurance and get it at next visit. Patient Care Team: Burnis Medin, MD as PCP - General (Internal Medicine) Johna Sheriff, MD (Ophthalmology)  Patient Instructions  Continue lifestyle intervention healthy eating and exercise . Diabetes is doing well.   Try weaning  Off the estrogen to  Get off as this is a risk for  Stroke  And clotting  .    Only take the valium with caution as it is a risk for falling .   Use some hydrocortisone on the itchy dry skin patch on the back  Up to 2-3 x per day.   You kidney function is  Decreased recently  Check lab in a month . If ok then plan rehceck with lab and diabetes check in 6 months or as needed   Check into cost of tetanus booster   And we can give any time  Medicare may not pay for this vaccine unless an injury.     Standley Brooking. Jenifer Struve M.D.  Health Maintenance  Topic Date Due  . Foot Exam  02/20/1940  . Tetanus/tdap  [birth year]/21/1951  . Urine Microalbumin  12/02/2012  . Influenza Vaccine  03/08/2013   . Ophthalmology Exam  10/26/2013  . Colonoscopy  12/08/2021  . Pneumococcal Polysaccharide Vaccine Age 53 And Over  Completed  . Zostavax  Completed

## 2012-12-24 ENCOUNTER — Other Ambulatory Visit: Payer: Self-pay | Admitting: Family Medicine

## 2012-12-24 DIAGNOSIS — E119 Type 2 diabetes mellitus without complications: Secondary | ICD-10-CM

## 2013-01-11 ENCOUNTER — Ambulatory Visit: Payer: Medicare Other | Admitting: Family Medicine

## 2013-01-11 ENCOUNTER — Encounter: Payer: Medicare Other | Admitting: Internal Medicine

## 2013-01-11 ENCOUNTER — Ambulatory Visit (INDEPENDENT_AMBULATORY_CARE_PROVIDER_SITE_OTHER): Payer: Medicare Other | Admitting: Family Medicine

## 2013-01-11 ENCOUNTER — Other Ambulatory Visit (INDEPENDENT_AMBULATORY_CARE_PROVIDER_SITE_OTHER): Payer: Medicare Other

## 2013-01-11 DIAGNOSIS — E119 Type 2 diabetes mellitus without complications: Secondary | ICD-10-CM

## 2013-01-11 DIAGNOSIS — Z23 Encounter for immunization: Secondary | ICD-10-CM

## 2013-01-11 LAB — BASIC METABOLIC PANEL
BUN: 26 mg/dL — ABNORMAL HIGH (ref 6–23)
CO2: 30 mEq/L (ref 19–32)
Calcium: 8.8 mg/dL (ref 8.4–10.5)
Chloride: 106 mEq/L (ref 96–112)
Creatinine, Ser: 1.2 mg/dL (ref 0.4–1.2)
GFR: 46.74 mL/min — ABNORMAL LOW (ref >60.00)
Glucose, Bld: 233 mg/dL — ABNORMAL HIGH (ref 70–99)
Potassium: 4.1 mEq/L (ref 3.5–5.1)
Sodium: 139 mEq/L (ref 135–145)

## 2013-01-11 LAB — MICROALBUMIN / CREATININE URINE RATIO
Creatinine,U: 138.6 mg/dL
Microalb Creat Ratio: 0.5 mg/g (ref 0.0–30.0)
Microalb, Ur: 0.7 mg/dL (ref 0.0–1.9)

## 2013-01-14 ENCOUNTER — Other Ambulatory Visit: Payer: Self-pay

## 2013-03-22 ENCOUNTER — Ambulatory Visit (INDEPENDENT_AMBULATORY_CARE_PROVIDER_SITE_OTHER): Payer: Medicare Other | Admitting: *Deleted

## 2013-03-22 DIAGNOSIS — Z23 Encounter for immunization: Secondary | ICD-10-CM | POA: Diagnosis not present

## 2013-04-05 DIAGNOSIS — H40019 Open angle with borderline findings, low risk, unspecified eye: Secondary | ICD-10-CM | POA: Diagnosis not present

## 2013-04-05 DIAGNOSIS — H04129 Dry eye syndrome of unspecified lacrimal gland: Secondary | ICD-10-CM | POA: Diagnosis not present

## 2013-06-14 ENCOUNTER — Other Ambulatory Visit (INDEPENDENT_AMBULATORY_CARE_PROVIDER_SITE_OTHER): Payer: Medicare Other

## 2013-06-14 DIAGNOSIS — IMO0001 Reserved for inherently not codable concepts without codable children: Secondary | ICD-10-CM | POA: Diagnosis not present

## 2013-06-14 DIAGNOSIS — Z1231 Encounter for screening mammogram for malignant neoplasm of breast: Secondary | ICD-10-CM | POA: Diagnosis not present

## 2013-06-14 LAB — HEMOGLOBIN A1C: Hgb A1c MFr Bld: 7.2 % — ABNORMAL HIGH (ref 4.6–6.5)

## 2013-06-17 ENCOUNTER — Encounter: Payer: Self-pay | Admitting: Internal Medicine

## 2013-06-17 ENCOUNTER — Ambulatory Visit (INDEPENDENT_AMBULATORY_CARE_PROVIDER_SITE_OTHER): Payer: Medicare Other | Admitting: Internal Medicine

## 2013-06-17 VITALS — BP 124/54 | HR 69 | Temp 97.9°F | Wt 160.0 lb

## 2013-06-17 DIAGNOSIS — E119 Type 2 diabetes mellitus without complications: Secondary | ICD-10-CM | POA: Diagnosis not present

## 2013-06-17 DIAGNOSIS — R6889 Other general symptoms and signs: Secondary | ICD-10-CM

## 2013-06-17 DIAGNOSIS — N289 Disorder of kidney and ureter, unspecified: Secondary | ICD-10-CM | POA: Diagnosis not present

## 2013-06-17 DIAGNOSIS — M1990 Unspecified osteoarthritis, unspecified site: Secondary | ICD-10-CM

## 2013-06-17 DIAGNOSIS — R0981 Nasal congestion: Secondary | ICD-10-CM

## 2013-06-17 DIAGNOSIS — J3489 Other specified disorders of nose and nasal sinuses: Secondary | ICD-10-CM

## 2013-06-17 LAB — CBC WITH DIFFERENTIAL/PLATELET
Basophils Absolute: 0 10*3/uL (ref 0.0–0.1)
Basophils Relative: 0.7 % (ref 0.0–3.0)
Eosinophils Absolute: 0.3 10*3/uL (ref 0.0–0.7)
Eosinophils Relative: 6.2 % — ABNORMAL HIGH (ref 0.0–5.0)
HCT: 38 % (ref 36.0–46.0)
Hemoglobin: 12.7 g/dL (ref 12.0–15.0)
Lymphocytes Relative: 35.6 % (ref 12.0–46.0)
Lymphs Abs: 1.7 10*3/uL (ref 0.7–4.0)
MCHC: 33.5 g/dL (ref 30.0–36.0)
MCV: 88.9 fl (ref 78.0–100.0)
Monocytes Absolute: 0.5 10*3/uL (ref 0.1–1.0)
Monocytes Relative: 10.7 % (ref 3.0–12.0)
Neutro Abs: 2.2 10*3/uL (ref 1.4–7.7)
Neutrophils Relative %: 46.8 % (ref 43.0–77.0)
Platelets: 224 10*3/uL (ref 150.0–400.0)
RBC: 4.28 Mil/uL (ref 3.87–5.11)
RDW: 13.8 % (ref 11.5–14.6)
WBC: 4.7 10*3/uL (ref 4.5–10.5)

## 2013-06-17 LAB — BASIC METABOLIC PANEL
BUN: 25 mg/dL — ABNORMAL HIGH (ref 6–23)
CO2: 32 mEq/L (ref 19–32)
Calcium: 9.6 mg/dL (ref 8.4–10.5)
Chloride: 102 mEq/L (ref 96–112)
Creatinine, Ser: 1.4 mg/dL — ABNORMAL HIGH (ref 0.4–1.2)
GFR: 39.3 mL/min — ABNORMAL LOW (ref >60.00)
Glucose, Bld: 94 mg/dL (ref 70–99)
Potassium: 4 mEq/L (ref 3.5–5.1)
Sodium: 140 mEq/L (ref 135–145)

## 2013-06-17 LAB — T4, FREE: Free T4: 0.78 ng/dL (ref 0.60–1.60)

## 2013-06-17 LAB — IBC PANEL
Iron: 74 ug/dL (ref 42–145)
Saturation Ratios: 23.9 % (ref 20.0–50.0)
Transferrin: 221.4 mg/dL (ref 212.0–360.0)

## 2013-06-17 LAB — FERRITIN: Ferritin: 78.1 ng/mL (ref 10.0–291.0)

## 2013-06-17 LAB — TSH: TSH: 1.63 u[IU]/mL (ref 0.35–5.50)

## 2013-06-17 MED ORDER — FLUTICASONE PROPIONATE 50 MCG/ACT NA SUSP: NASAL | Status: DC

## 2013-06-17 NOTE — Progress Notes (Signed)
Chief Complaint  Patient presents with  . Follow-up    HPI: Patient comes in today for follow up of  multiple medical problems.    DM :no lows  FOOT EXAM   Diabetic Foot exam was performed with the following findings: Yes 12/23/2012 12:00 PM  Hasn't checked her blood sugars recently. No major changes in her vision  Has noted increased stuffiness in her nose she gets it every winter sometimes with sneezing makes it hard for debridement night when she really just nasal positioning. No treatment. Occasional nasal blood. No face pain fever significant cough.  RISK MEDS valium for vertigo: last time she used it was 3 weeks ago only takes it at bedtime no side effects that way no falling.  Describes cold intolerance more in the last 6 months had anemia when she was younger asks about iron and other causes. Does and even have to use the air conditioning in the summer.  Had mamm[facility] last week .   ROS: See pertinent positives and negatives per HPI. No current chest pain shortness of breath falling new neurologic symptoms.  Past Medical History  Diagnosis Date  . Arthritis   . Diabetes mellitus   . Colon polyps   . Diverticulosis   . Hemorrhoids   . Dyslipidemia   . GERD (gastroesophageal reflux disease)     Family History  Problem Relation Age of Onset  . Colon cancer Neg Hx   . Stomach cancer Neg Hx   . Coronary artery disease Brother     MI in his 30s    History   Social History  . Marital Status: Married    Spouse Name: N/A    Number of Children: 2  . Years of Education: N/A   Social History Main Topics  . Smoking status: Never Smoker   . Smokeless tobacco: Never Used  . Alcohol Use: No  . Drug Use: No  . Sexual Activity: No   Other Topics Concern  . None   Social History Narrative   HH 2 married   Works for [employer] for 40 years retired   No pets     Outpatient Encounter Prescriptions as of 06/17/2013  Medication Sig  . Ascorbic Acid (VITAMIN C)  100 MG tablet Take 100 mg by mouth daily.    Marland Kitchen aspirin 81 MG chewable tablet Chew 81 mg by mouth daily.    Marland Kitchen atorvastatin (LIPITOR) 10 MG tablet Take 1 tablet (10 mg total) by mouth daily. qd1  . Blood Glucose Calibration (OT ULTRA/FASTTK CNTRL SOLN) SOLN Use as instructed  . Calcium Carbonate-Vitamin D (CALCIUM 500 + D PO) Take by mouth 2 (two) times daily.   . Cholecalciferol (VITAMIN D3) 2000 UNITS TABS Take by mouth. Taking 3 times weekly  . diazepam (VALIUM) 2 MG tablet Take one as needed for vertigo can repeat x 1 if needed maximum 3 per day.  . esomeprazole (NEXIUM) 40 MG capsule Take 1 capsule (40 mg total) by mouth daily before breakfast.  . furosemide (LASIX) 40 MG tablet Take 1 tablet (40 mg total) by mouth daily. For edema  . glipiZIDE (GLUCOTROL XL) 5 MG 24 hr tablet Take 1 tablet (5 mg total) by mouth daily.  Marland Kitchen glucose blood (ONE TOUCH ULTRA TEST) test strip Use as instructed;  . Lancets (ONETOUCH ULTRASOFT) lancets Use as instructed  . thiamine (VITAMIN B-1) 100 MG tablet Take 100 mg by mouth daily.  . fluticasone (FLONASE) 50 MCG/ACT nasal spray 2 spray each  nostril qd  . [DISCONTINUED] docusate sodium (COLACE) 100 MG capsule Take 100 mg by mouth daily.   . [DISCONTINUED] estradiol (ESTRACE) 1 MG tablet Take 0.5 tablets (0.5 mg total) by mouth daily.  . [DISCONTINUED] fish oil-omega-3 fatty acids 1000 MG capsule Take by mouth daily.    . [DISCONTINUED] HYDROcodone-acetaminophen (VICODIN) 5-500 MG per tablet Take 1 tablet by mouth every 4 (four) hours as needed for pain.  . [DISCONTINUED] pyridOXINE (VITAMIN B-6) 100 MG tablet Take 100 mg by mouth daily.  . [DISCONTINUED] valACYclovir (VALTREX) 1000 MG tablet Take 1 tablet (1,000 mg total) by mouth 3 (three) times daily.  . [DISCONTINUED] vitamin B-12 (CYANOCOBALAMIN) 1000 MCG tablet Take 1,000 mcg by mouth daily.    EXAM:  BP 124/54  Pulse 69  Temp(Src) 97.9 F (36.6 C) (Oral)  Wt 160 lb (72.576 kg)  SpO2 95%  Body  mass index is 29.98 kg/(m^2).  GENERAL: vitals reviewed and listed above, alert, oriented, appears well hydrated and in no acute distress looks well well-groomed wearing glasses cognition intact HEENT: atraumatic, conjunctiva  clear, no obvious abnormalities on inspection of external nose and ears TMs are clear nares turbinates +1 to +2 no discharge face nontender OP : no lesion edema or exudate  NECK: no obvious masses on inspection palpation no adenopathy LUNGS: clear to auscultation bilaterally, no wheezes, rales or rhonchi, good air movement CV: HRRR, no clubbing cyanosis or  peripheral edema nl cap refill  MS: moves all extremities without noticeable focal  abnormality PSYCH: pleasant and cooperative, no obvious depression or anxiety Reviewed last set of labs below. Lab Results  Component Value Date   WBC 4.5 12/16/2012   HGB 13.4 12/16/2012   HCT 39.4 12/16/2012   PLT 233.0 12/16/2012   GLUCOSE 233* 01/11/2013   CHOL 171 12/16/2012   TRIG 216.0* 12/16/2012   HDL 39.90 12/16/2012   LDLDIRECT 94.8 12/16/2012   ALT 16 12/16/2012   AST 20 12/16/2012   NA 139 01/11/2013   K 4.1 01/11/2013   CL 106 01/11/2013   CREATININE 1.2 01/11/2013   BUN 26* 01/11/2013   CO2 30 01/11/2013   TSH 3.38 12/16/2012   INR 1.0 02/15/2009   HGBA1C 7.2* 06/14/2013   MICROALBUR 0.7 01/11/2013    ASSESSMENT AND PLAN:  Discussed the following assessment and plan:  DIABETES MELLITUS, TYPE II, CONTROLLED - Minor increase but would not change her medicine avoid hypoglycemia check readings as discussed - Plan: fluticasone (FLONASE) 50 MCG/ACT nasal spray, CBC with Differential, IBC Panel, Ferritin, TSH, T4, free, Basic metabolic panel  Nasal congestion - Nasal stuffiness could be dryness but with sneezing history and every winter could also be allergic saline nose washes trial of nasal steroids  - Plan: fluticasone (FLONASE) 50 MCG/ACT nasal spray, CBC with Differential, IBC Panel, Ferritin, TSH, T4, free, Basic metabolic  panel  Renal insufficiency - Recheck creatinine today. - Plan: fluticasone (FLONASE) 50 MCG/ACT nasal spray, CBC with Differential, IBC Panel, Ferritin, TSH, T4, free, Basic metabolic panel  DJD (degenerative joint disease) - Plan: fluticasone (FLONASE) 50 MCG/ACT nasal spray, CBC with Differential, IBC Panel, Ferritin, TSH, T4, free, Basic metabolic panel  Cold intolerance - Check iron studies thyroid. - Plan: fluticasone (FLONASE) 50 MCG/ACT nasal spray, CBC with Differential, IBC Panel, Ferritin, TSH, T4, free, Basic metabolic panel  -Patient advised to return or notify health care team  if symptoms worsen or persist or new concerns arise.  Patient Instructions   Goal is to have your fasting  blood sugar below 126 but above 100.  Check your blood sugar readings about 3 times a week fasting  If these are good check 3 times a week 2 hours after eating to see how high it peaks   goal below 160  Contact us if you're having to low blood sugars were too high.  Sometimes cold intolerance is related to iron deficiency or thyroid problems.    last anemia and thyroid check was in June of last year and was normal  However we can check these numbers earlier than June.  Nasal stuffiness in the winter could be from allergy and or dryness.  Try nasal saline sprays every night and as needed to moisturize the nose.  Also try a nasal cortisone prescription for Flonase or over-the-counter Nasacort 2 sprays in each side of the nose every day. If not helping after 2 weeks then it probably won't help.  Wellness and labs in 6 months but we can check for iron and thyroid earlier.   Standley Brooking. Panosh M.D.  Pre visit review using our clinic review tool, if applicable. No additional management support is needed unless otherwise documented below in the visit note.

## 2013-06-17 NOTE — Patient Instructions (Signed)
   Goal is to have your fasting blood sugar below 126 but above 100.  Check your blood sugar readings about 3 times a week fasting  If these are good check 3 times a week 2 hours after eating to see how high it peaks   goal below 160  Contact us if you're having to low blood sugars were too high.  Sometimes cold intolerance is related to iron deficiency or thyroid problems.    last anemia and thyroid check was in June of last year and was normal  However we can check these numbers earlier than June.  Nasal stuffiness in the winter could be from allergy and or dryness.  Try nasal saline sprays every night and as needed to moisturize the nose.  Also try a nasal cortisone prescription for Flonase or over-the-counter Nasacort 2 sprays in each side of the nose every day. If not helping after 2 weeks then it probably won't help.  Wellness and labs in 6 months but we can check for iron and thyroid earlier.

## 2013-06-21 ENCOUNTER — Ambulatory Visit: Payer: Medicare Other | Admitting: Internal Medicine

## 2013-06-22 ENCOUNTER — Encounter: Payer: Self-pay | Admitting: Family Medicine

## 2013-07-05 ENCOUNTER — Encounter: Payer: Self-pay | Admitting: Internal Medicine

## 2013-09-08 ENCOUNTER — Telehealth: Payer: Self-pay | Admitting: Family Medicine

## 2013-09-08 MED ORDER — GLUCOSE BLOOD VI STRP: ORAL_STRIP | Status: DC

## 2013-09-08 MED ORDER — ONETOUCH ULTRASOFT LANCETS MISC: Status: DC

## 2013-09-08 NOTE — Telephone Encounter (Signed)
All medications were filled for 1 year in June 2014 except for lancets and test strips.  Sent those to [facility].  Nothing else needs to be filled.

## 2013-09-08 NOTE — Telephone Encounter (Signed)
glipiZIDE (GLUCOTROL XL) 5 MG 24 hr tablet atorvastatin (LIPITOR) 10 MG tablet esomeprazole (NEXIUM) 40 MG capsule furosemide (LASIX) 40 MG tablet Lancets (ONETOUCH ULTRASOFT) lancets glucose blood (ONE TOUCH ULTRA TEST) test strip   Pt would like these sent to [facility]

## 2013-11-10 DIAGNOSIS — H903 Sensorineural hearing loss, bilateral: Secondary | ICD-10-CM | POA: Diagnosis not present

## 2013-11-10 DIAGNOSIS — H905 Unspecified sensorineural hearing loss: Secondary | ICD-10-CM | POA: Diagnosis not present

## 2013-11-15 DIAGNOSIS — E119 Type 2 diabetes mellitus without complications: Secondary | ICD-10-CM | POA: Diagnosis not present

## 2013-11-15 DIAGNOSIS — H40019 Open angle with borderline findings, low risk, unspecified eye: Secondary | ICD-10-CM | POA: Diagnosis not present

## 2013-11-15 DIAGNOSIS — H35039 Hypertensive retinopathy, unspecified eye: Secondary | ICD-10-CM | POA: Diagnosis not present

## 2013-11-15 DIAGNOSIS — H524 Presbyopia: Secondary | ICD-10-CM | POA: Diagnosis not present

## 2013-11-15 LAB — HM DIABETES EYE EXAM

## 2013-11-16 ENCOUNTER — Encounter: Payer: Self-pay | Admitting: Internal Medicine

## 2013-12-16 ENCOUNTER — Encounter: Payer: Self-pay | Admitting: Internal Medicine

## 2013-12-16 ENCOUNTER — Ambulatory Visit (INDEPENDENT_AMBULATORY_CARE_PROVIDER_SITE_OTHER): Payer: Medicare Other | Admitting: Internal Medicine

## 2013-12-16 VITALS — BP 134/70 | Temp 98.2°F | Ht 61.0 in | Wt 162.0 lb

## 2013-12-16 DIAGNOSIS — R6889 Other general symptoms and signs: Secondary | ICD-10-CM

## 2013-12-16 DIAGNOSIS — M19049 Primary osteoarthritis, unspecified hand: Secondary | ICD-10-CM

## 2013-12-16 DIAGNOSIS — Z9889 Other specified postprocedural states: Secondary | ICD-10-CM

## 2013-12-16 DIAGNOSIS — Z0000 Encounter for general adult medical examination without abnormal findings: Secondary | ICD-10-CM | POA: Diagnosis not present

## 2013-12-16 DIAGNOSIS — N289 Disorder of kidney and ureter, unspecified: Secondary | ICD-10-CM

## 2013-12-16 DIAGNOSIS — M1990 Unspecified osteoarthritis, unspecified site: Secondary | ICD-10-CM | POA: Diagnosis not present

## 2013-12-16 DIAGNOSIS — Z23 Encounter for immunization: Secondary | ICD-10-CM

## 2013-12-16 DIAGNOSIS — K625 Hemorrhage of anus and rectum: Secondary | ICD-10-CM

## 2013-12-16 DIAGNOSIS — E119 Type 2 diabetes mellitus without complications: Secondary | ICD-10-CM

## 2013-12-16 DIAGNOSIS — E785 Hyperlipidemia, unspecified: Secondary | ICD-10-CM | POA: Diagnosis not present

## 2013-12-16 DIAGNOSIS — Z79899 Other long term (current) drug therapy: Secondary | ICD-10-CM | POA: Insufficient documentation

## 2013-12-16 DIAGNOSIS — R9431 Abnormal electrocardiogram [ECG] [EKG]: Secondary | ICD-10-CM

## 2013-12-16 DIAGNOSIS — Z974 Presence of external hearing-aid: Secondary | ICD-10-CM

## 2013-12-16 LAB — CBC WITH DIFFERENTIAL/PLATELET
Basophils Absolute: 0 10*3/uL (ref 0.0–0.1)
Basophils Relative: 0.7 % (ref 0.0–3.0)
Eosinophils Absolute: 0.2 10*3/uL (ref 0.0–0.7)
Eosinophils Relative: 3.7 % (ref 0.0–5.0)
HCT: 36 % (ref 36.0–46.0)
Hemoglobin: 12.2 g/dL (ref 12.0–15.0)
Lymphocytes Relative: 35.8 % (ref 12.0–46.0)
Lymphs Abs: 1.7 10*3/uL (ref 0.7–4.0)
MCHC: 33.9 g/dL (ref 30.0–36.0)
MCV: 88.2 fl (ref 78.0–100.0)
Monocytes Absolute: 0.4 10*3/uL (ref 0.1–1.0)
Monocytes Relative: 8.6 % (ref 3.0–12.0)
Neutro Abs: 2.4 10*3/uL (ref 1.4–7.7)
Neutrophils Relative %: 51.2 % (ref 43.0–77.0)
Platelets: 216 10*3/uL (ref 150.0–400.0)
RBC: 4.09 Mil/uL (ref 3.87–5.11)
RDW: 14.1 % (ref 11.5–15.5)
WBC: 4.7 10*3/uL (ref 4.0–10.5)

## 2013-12-16 LAB — LIPID PANEL
Cholesterol: 173 mg/dL (ref 0–200)
HDL: 38.1 mg/dL — ABNORMAL LOW (ref >39.00)
LDL Cholesterol: 83 mg/dL (ref 0–99)
NonHDL: 134.9
Total CHOL/HDL Ratio: 5
Triglycerides: 260 mg/dL — ABNORMAL HIGH (ref 0.0–149.0)
VLDL: 52 mg/dL — ABNORMAL HIGH (ref 0.0–40.0)

## 2013-12-16 LAB — FERRITIN: Ferritin: 59.5 ng/mL (ref 10.0–291.0)

## 2013-12-16 LAB — TSH: TSH: 1.82 u[IU]/mL (ref 0.35–4.50)

## 2013-12-16 LAB — HEPATIC FUNCTION PANEL
ALT: 15 U/L (ref 0–35)
AST: 18 U/L (ref 0–37)
Albumin: 3.9 g/dL (ref 3.5–5.2)
Alkaline Phosphatase: 75 U/L (ref 39–117)
Bilirubin, Direct: 0 mg/dL (ref 0.0–0.3)
Total Bilirubin: 0.4 mg/dL (ref 0.2–1.2)
Total Protein: 7.1 g/dL (ref 6.0–8.3)

## 2013-12-16 LAB — BASIC METABOLIC PANEL
BUN: 25 mg/dL — ABNORMAL HIGH (ref 6–23)
CO2: 30 mEq/L (ref 19–32)
Calcium: 9.7 mg/dL (ref 8.4–10.5)
Chloride: 103 mEq/L (ref 96–112)
Creatinine, Ser: 1.3 mg/dL — ABNORMAL HIGH (ref 0.4–1.2)
GFR: 40.97 mL/min — ABNORMAL LOW (ref >60.00)
Glucose, Bld: 124 mg/dL — ABNORMAL HIGH (ref 70–99)
Potassium: 3.7 mEq/L (ref 3.5–5.1)
Sodium: 140 mEq/L (ref 135–145)

## 2013-12-16 LAB — IBC PANEL
Iron: 71 ug/dL (ref 42–145)
Saturation Ratios: 24.2 % (ref 20.0–50.0)
Transferrin: 209.2 mg/dL — ABNORMAL LOW (ref 212.0–360.0)

## 2013-12-16 LAB — HEMOGLOBIN A1C: Hgb A1c MFr Bld: 7.6 % — ABNORMAL HIGH (ref 4.6–6.5)

## 2013-12-16 MED ORDER — FUROSEMIDE 40 MG PO TABS
40.0000 mg | ORAL_TABLET | Freq: Every day | ORAL | Status: DC
Start: 2013-12-16 — End: 2014-02-24

## 2013-12-16 MED ORDER — ESOMEPRAZOLE MAGNESIUM 40 MG PO CPDR: 40.0000 mg | DELAYED_RELEASE_CAPSULE | Freq: Every day | ORAL | Status: DC

## 2013-12-16 MED ORDER — GLIPIZIDE ER 5 MG PO TB24: 5.0000 mg | ORAL_TABLET | Freq: Every day | ORAL | Status: DC

## 2013-12-16 MED ORDER — ATORVASTATIN CALCIUM 10 MG PO TABS: 10.0000 mg | ORAL_TABLET | Freq: Every day | ORAL | Status: DC

## 2013-12-16 NOTE — Patient Instructions (Addendum)
i think this is a hemorrhoidal issue  Try tucks  Plan to get you to see Gi cause of  Persistent sx.  Will notify you  of labs when available. prevnar today .   Hemorrhoids Hemorrhoids are swollen veins around the rectum or anus. There are two types of hemorrhoids:   Internal hemorrhoids. These occur in the veins just inside the rectum. They may poke through to the outside and become irritated and painful.  External hemorrhoids. These occur in the veins outside the anus and can be felt as a painful swelling or hard lump near the anus. CAUSES  Pregnancy.   Obesity.   Constipation or diarrhea.   Straining to have a bowel movement.   Sitting for long periods on the toilet.  Heavy lifting or other activity that caused you to strain.  Anal intercourse. SYMPTOMS   Pain.   Anal itching or irritation.   Rectal bleeding.   Fecal leakage.   Anal swelling.   One or more lumps around the anus.  DIAGNOSIS  Your caregiver may be able to diagnose hemorrhoids by visual examination. Other examinations or tests that may be performed include:   Examination of the rectal area with a gloved hand (digital rectal exam).   Examination of anal canal using a small tube (scope).   A blood test if you have lost a significant amount of blood.  A test to look inside the colon (sigmoidoscopy or colonoscopy). TREATMENT Most hemorrhoids can be treated at home. However, if symptoms do not seem to be getting better or if you have a lot of rectal bleeding, your caregiver may perform a procedure to help make the hemorrhoids get smaller or remove them completely. Possible treatments include:   Placing a rubber band at the base of the hemorrhoid to cut off the circulation (rubber band ligation).   Injecting a chemical to shrink the hemorrhoid (sclerotherapy).   Using a tool to burn the hemorrhoid (infrared light therapy).   Surgically removing the hemorrhoid (hemorrhoidectomy).    Stapling the hemorrhoid to block blood flow to the tissue (hemorrhoid stapling).  HOME CARE INSTRUCTIONS   Eat foods with fiber, such as whole grains, beans, nuts, fruits, and vegetables. Ask your doctor about taking products with added fiber in them (fibersupplements).  Increase fluid intake. Drink enough water and fluids to keep your urine clear or pale yellow.   Exercise regularly.   Go to the bathroom when you have the urge to have a bowel movement. Do not wait.   Avoid straining to have bowel movements.   Keep the anal area dry and clean. Use wet toilet paper or moist towelettes after a bowel movement.   Medicated creams and suppositories may be used or applied as directed.   Only take over-the-counter or prescription medicines as directed by your caregiver.   Take warm sitz baths for 15 20 minutes, 3 4 times a day to ease pain and discomfort.   Place ice packs on the hemorrhoids if they are tender and swollen. Using ice packs between sitz baths may be helpful.   Put ice in a plastic bag.   Place a towel between your skin and the bag.   Leave the ice on for 15 20 minutes, 3 4 times a day.   Do not use a donut-shaped pillow or sit on the toilet for long periods. This increases blood pooling and pain.  SEEK MEDICAL CARE IF:  You have increasing pain and swelling that is not controlled  by treatment or medicine.  You have uncontrolled bleeding.  You have difficulty or you are unable to have a bowel movement.  You have pain or inflammation outside the area of the hemorrhoids. MAKE SURE YOU:  Understand these instructions.  Will watch your condition.  Will get help right away if you are not doing well or get worse. Document Released: 06/21/2000 Document Revised: 06/10/2012 Document Reviewed: 04/28/2012 [facility] Patient Information 2014 [place].

## 2013-12-16 NOTE — Progress Notes (Signed)
Chief Complaint  Patient presents with  . Medicare Wellness    med refillm hand issues and rectal hem bleeding    HPI: Patient comes in today for Preventive Medicare wellness visit . And multiple med issues  No major injuries, ed visits ,hospitalizations , new medications since last visit. DM:Not checking  No low bg  LIPID: Taking  lipitor  No obv se  DIzziness : take valium ocass 2 x per month no se  Taking lasix 20 mg per day 1/2 40 not taking k supplement but hgh k diet   New Right hand  Hard to close fingers and grip  To see hand Dr Amedeo Plenty.   ? If hemorrhoid in rectal area  For about a long time but now with bm has some blood.   Every time for Months    Last colon  Across from [place] .  ? When and ok   Health Maintenance  Topic Date Due  . Hemoglobin A1c  12/13/2013  . Foot Exam  12/23/2013  . Urine Microalbumin  01/11/2014  . Influenza Vaccine  02/05/2014  . Ophthalmology Exam  11/16/2014  . Colonoscopy  12/08/2021  . Tetanus/tdap  01/12/2023  . Pneumococcal Polysaccharide Vaccine Age 87 And Over  Completed  . Zostavax  Completed   Health Maintenance Review     Hearing: hearing aids   Vision:  No limitations at present . Last eye check UTD hecker   Safety:  Has smoke detector and wears seat belts.  No firearms. No excess sun exposure. Sees dentist regularly.  Falls: no  Advance directive :  Reviewed    Memory: Felt to be good  , no concern from her or her family.  Depression: No anhedonia unusual crying or depressive symptoms  Nutrition: Eats well balanced diet; adequate calcium and vitamin D. No swallowing chewing problems.  Injury: no major injuries in the last six months.  Other healthcare providers:  Reviewed today .  Social:  Lives with spouse married. No pets.   Preventive parameters: up-to-date  Reviewed   ADLS:   There are no problems or need for assistance  driving, feeding, obtaining food, dressing, toileting and bathing, managing money  using phone. She is independent.  EXERCISE/ HABITS  noexerPer week   No tobacco   no etoh   ROS:  GEN/ HEENT: No fever, significant weight changes sweats headaches vision problems hearing changes, CV/ PULM; No chest pain shortness of breath cough, syncope,edema  change in exercise tolerance. Had neg eval for abn ekg in the past  GI /GU: No adominal pain, vomiting, change in bowel habitsNo significant GU symptoms. See above  SKIN/HEME: ,no acute skin rashes suspicious lesions or bleeding. No lymphadenopathy, nodules, masses.  NEURO/ PSYCH:  No neurologic signs such as weakness numbness. No depression anxiety. IMM/ Allergy: No unusual infections.  Allergy .   REST of 12 system review negative except as per HPI  Past Medical History  Diagnosis Date  . Arthritis   . Diabetes mellitus   . Colon polyps   . Diverticulosis   . Hemorrhoids   . Dyslipidemia   . GERD (gastroesophageal reflux disease)     Family History  Problem Relation Age of Onset  . Colon cancer Neg Hx   . Stomach cancer Neg Hx   . Coronary artery disease Brother     MI in his 12s    History   Social History  . Marital Status: Married    Spouse Name: N/A  Number of Children: 2  . Years of Education: N/A   Social History Main Topics  . Smoking status: Never Smoker   . Smokeless tobacco: Never Used  . Alcohol Use: No  . Drug Use: No  . Sexual Activity: No   Other Topics Concern  . None   Social History Narrative   HH 2 married   Works for [employer] for 40 years retired   No pets     Outpatient Encounter Prescriptions as of 12/16/2013  Medication Sig  . aspirin 81 MG chewable tablet Chew 81 mg by mouth daily.    Marland Kitchen atorvastatin (LIPITOR) 10 MG tablet Take 1 tablet (10 mg total) by mouth daily. qd1  . Blood Glucose Calibration (OT ULTRA/FASTTK CNTRL SOLN) SOLN Use as instructed  . Calcium Carbonate-Vitamin D (CALCIUM 500 + D PO) Take by mouth 2 (two) times daily.   . Cholecalciferol (VITAMIN D3)  2000 UNITS TABS Take by mouth. Taking 3 times weekly  . diazepam (VALIUM) 2 MG tablet Take one as needed for vertigo can repeat x 1 if needed maximum 3 per day.  . esomeprazole (NEXIUM) 40 MG capsule Take 1 capsule (40 mg total) by mouth daily before breakfast.  . furosemide (LASIX) 40 MG tablet Take 1 tablet (40 mg total) by mouth daily. For edema  . glipiZIDE (GLUCOTROL XL) 5 MG 24 hr tablet Take 1 tablet (5 mg total) by mouth daily.  Marland Kitchen glucose blood (ONE TOUCH ULTRA TEST) test strip Use as instructed;  . Lancets (ONETOUCH ULTRASOFT) lancets Use as instructed  . [DISCONTINUED] atorvastatin (LIPITOR) 10 MG tablet Take 1 tablet (10 mg total) by mouth daily. qd1  . [DISCONTINUED] esomeprazole (NEXIUM) 40 MG capsule Take 1 capsule (40 mg total) by mouth daily before breakfast.  . [DISCONTINUED] furosemide (LASIX) 40 MG tablet Take 1 tablet (40 mg total) by mouth daily. For edema  . [DISCONTINUED] glipiZIDE (GLUCOTROL XL) 5 MG 24 hr tablet Take 1 tablet (5 mg total) by mouth daily.  . [DISCONTINUED] Ascorbic Acid (VITAMIN C) 100 MG tablet Take 100 mg by mouth daily.    . [DISCONTINUED] fluticasone (FLONASE) 50 MCG/ACT nasal spray 2 spray each nostril qd  . [DISCONTINUED] thiamine (VITAMIN B-1) 100 MG tablet Take 100 mg by mouth daily.    EXAM:  BP 134/70  Temp(Src) 98.2 F (36.8 C) (Oral)  Ht 5\' 1"  (1.549 m)  Wt 162 lb (73.483 kg)  BMI 30.63 kg/m2  Body mass index is 30.63 kg/(m^2).  Physical Exam: Vital signs reviewed [id number] is a well-developed well-nourished alert cooperative   who appears stated age in no acute distress. Glasses  HEENT: normocephalic atraumatic , Eyes: PERRL EOM's full, conjunctiva clear, Nares: paten,t no deformity discharge or tenderness., Ears: no deformity EAC's clear TMs with normal landmarks. Mouth: clear OP, no lesions, edema.  Moist mucous membranes. Dentition in adequate repair. NECK: supple without masses, thyromegaly or bruits.Breast: normal by inspection  . No dimpling, discharge, masses, tenderness or discharge . CHEST/PULM:  Clear to auscultation and percussion breath sounds equal no wheeze , rales or rhonchi. No chest wall deformities or tenderness. CV: PMI is nondisplaced, S1 S2 no gallops, murmurs, rubs. Peripheral pulses are full without delay.No JVD .  ABDOMEN: Bowel sounds normal nontender  No guard or rebound, no hepato splenomegal no CVA tenderness.  No hernia. Extremtities:  No clubbing cyanosis or edema, no acute joint swelling or redness no focal atrophy right had arthritis  Dec rom unable to extend fingers  Vv  legs no sig edema NEURO:  Oriented x3, cranial nerves 3-12 appear to be intact, no obvious focal weakness,gait within normal limits no abnormal reflexes or asymmetrical SKIN: No acute rashes normal turgor, color, no bruising or petechiae. Rectal multiple tags  No masses or bleeding noted  PSYCH: Oriented, good eye contact, no obvious depression anxiety, cognition and judgment appear normal. LN: no cervical axillary inguinal adenopathy No noted deficits in memory, attention, and speech. Diabetic foot exam sense intact left dp 1+ r 2+  Lab Results  Component Value Date   WBC 4.7 12/16/2013   HGB 12.2 12/16/2013   HCT 36.0 12/16/2013   PLT 216.0 12/16/2013   GLUCOSE 124* 12/16/2013   CHOL 173 12/16/2013   TRIG 260.0* 12/16/2013   HDL 38.10* 12/16/2013   LDLDIRECT 94.8 12/16/2012   LDLCALC 83 12/16/2013   ALT 15 12/16/2013   AST 18 12/16/2013   NA 140 12/16/2013   K 3.7 12/16/2013   CL 103 12/16/2013   CREATININE 1.3* 12/16/2013   BUN 25* 12/16/2013   CO2 30 12/16/2013   TSH 1.82 12/16/2013   INR 1.0 02/15/2009   HGBA1C 7.6* 12/16/2013   MICROALBUR 0.7 01/11/2013    ASSESSMENT AND PLAN:  Discussed the following assessment and plan:  Medicare annual wellness visit, subsequent - utd advise prevnar  - Plan: EKG 12-Lead  DIABETES MELLITUS, TYPE II, CONTROLLED - no lows su higher risk discussed but bene more than risk at this timee -  Plan: Basic metabolic panel, CBC with Differential, Hepatic function panel, Hemoglobin A1c, Lipid panel, TSH, IBC panel, Ferritin  HYPERLIPIDEMIA - Plan: Basic metabolic panel, CBC with Differential, Hepatic function panel, Hemoglobin A1c, Lipid panel, TSH, IBC panel, Ferritin  Renal insufficiency - check cr and potassium as on lasiz eeithout supplement - Plan: Basic metabolic panel, CBC with Differential, Hepatic function panel, Hemoglobin A1c, Lipid panel, TSH, IBC panel, Ferritin  DJD (degenerative joint disease) - Plan: Basic metabolic panel, CBC with Differential, Hepatic function panel, Hemoglobin A1c, Lipid panel, TSH, IBC panel, Ferritin  Wears hearing aid - Plan: Basic metabolic panel, CBC with Differential, Hepatic function panel, Hemoglobin A1c, Lipid panel, TSH, IBC panel, Ferritin  Cold intolerance - Plan: Basic metabolic panel, CBC with Differential, Hepatic function panel, Hemoglobin A1c, Lipid panel, TSH, IBC panel, Ferritin  Medication management - Plan: Basic metabolic panel, CBC with Differential, Hepatic function panel, Hemoglobin A1c, Lipid panel, TSH, IBC panel, Ferritin  Abnormal EKG - no sig change  - Plan: Basic metabolic panel, CBC with Differential, Hepatic function panel, Hemoglobin A1c, Lipid panel, TSH, IBC panel, Ferritin, EKG 12-Lead  Hyperlipidemia - Plan: atorvastatin (LIPITOR) 10 MG tablet  Need for vaccination with 13-polyvalent pneumococcal conjugate vaccine - Plan: Pneumococcal conjugate vaccine 13-valent  Hand arthritis - agree with seeing dr Amedeo Plenty.  Rectal bleeding - with bm had hemorroidal tags no ov mass on exam  local care see GI - Plan: Ambulatory referral to Gastroenterology Counseled   About prevention nd "screening" UTD fall prevention esp when needs valium  Patient Care Team: Burnis Medin, MD as PCP - General (Internal Medicine) Johna Sheriff, MD (Ophthalmology)  Patient Instructions  i think this is a hemorrhoidal issue  Try tucks    Plan to get you to see Gi cause of  Persistent sx.  Will notify you  of labs when available. prevnar today .   Hemorrhoids Hemorrhoids are swollen veins around the rectum or anus. There are two types of hemorrhoids:   Internal hemorrhoids. These  occur in the veins just inside the rectum. They may poke through to the outside and become irritated and painful.  External hemorrhoids. These occur in the veins outside the anus and can be felt as a painful swelling or hard lump near the anus. CAUSES  Pregnancy.   Obesity.   Constipation or diarrhea.   Straining to have a bowel movement.   Sitting for long periods on the toilet.  Heavy lifting or other activity that caused you to strain.  Anal intercourse. SYMPTOMS   Pain.   Anal itching or irritation.   Rectal bleeding.   Fecal leakage.   Anal swelling.   One or more lumps around the anus.  DIAGNOSIS  Your caregiver may be able to diagnose hemorrhoids by visual examination. Other examinations or tests that may be performed include:   Examination of the rectal area with a gloved hand (digital rectal exam).   Examination of anal canal using a small tube (scope).   A blood test if you have lost a significant amount of blood.  A test to look inside the colon (sigmoidoscopy or colonoscopy). TREATMENT Most hemorrhoids can be treated at home. However, if symptoms do not seem to be getting better or if you have a lot of rectal bleeding, your caregiver may perform a procedure to help make the hemorrhoids get smaller or remove them completely. Possible treatments include:   Placing a rubber band at the base of the hemorrhoid to cut off the circulation (rubber band ligation).   Injecting a chemical to shrink the hemorrhoid (sclerotherapy).   Using a tool to burn the hemorrhoid (infrared light therapy).   Surgically removing the hemorrhoid (hemorrhoidectomy).   Stapling the hemorrhoid to block blood flow to  the tissue (hemorrhoid stapling).  HOME CARE INSTRUCTIONS   Eat foods with fiber, such as whole grains, beans, nuts, fruits, and vegetables. Ask your doctor about taking products with added fiber in them (fibersupplements).  Increase fluid intake. Drink enough water and fluids to keep your urine clear or pale yellow.   Exercise regularly.   Go to the bathroom when you have the urge to have a bowel movement. Do not wait.   Avoid straining to have bowel movements.   Keep the anal area dry and clean. Use wet toilet paper or moist towelettes after a bowel movement.   Medicated creams and suppositories may be used or applied as directed.   Only take over-the-counter or prescription medicines as directed by your caregiver.   Take warm sitz baths for 15 20 minutes, 3 4 times a day to ease pain and discomfort.   Place ice packs on the hemorrhoids if they are tender and swollen. Using ice packs between sitz baths may be helpful.   Put ice in a plastic bag.   Place a towel between your skin and the bag.   Leave the ice on for 15 20 minutes, 3 4 times a day.   Do not use a donut-shaped pillow or sit on the toilet for long periods. This increases blood pooling and pain.  SEEK MEDICAL CARE IF:  You have increasing pain and swelling that is not controlled by treatment or medicine.  You have uncontrolled bleeding.  You have difficulty or you are unable to have a bowel movement.  You have pain or inflammation outside the area of the hemorrhoids. MAKE SURE YOU:  Understand these instructions.  Will watch your condition.  Will get help right away if you are not doing well  or get worse. Document Released: 06/21/2000 Document Revised: 06/10/2012 Document Reviewed: 04/28/2012 [facility] Patient Information 2014 [place].     Standley Brooking. Nawaf Strange M.D.    Pre visit review using our clinic review tool, if applicable. No additional management support is needed unless  otherwise documented below in the visit note.

## 2013-12-17 DIAGNOSIS — M6530 Trigger finger, unspecified finger: Secondary | ICD-10-CM | POA: Diagnosis not present

## 2013-12-21 ENCOUNTER — Encounter: Payer: Self-pay | Admitting: Internal Medicine

## 2013-12-24 ENCOUNTER — Telehealth: Payer: Self-pay | Admitting: Internal Medicine

## 2013-12-24 NOTE — Telephone Encounter (Signed)
Pt states she never received her lab results that were done on 12/16/13.

## 2013-12-27 NOTE — Telephone Encounter (Signed)
Patient notified of results by telephone.  See results.

## 2014-01-21 ENCOUNTER — Encounter: Payer: Self-pay | Admitting: Internal Medicine

## 2014-01-21 ENCOUNTER — Ambulatory Visit (INDEPENDENT_AMBULATORY_CARE_PROVIDER_SITE_OTHER): Payer: Medicare Other | Admitting: Internal Medicine

## 2014-01-21 VITALS — BP 144/60 | Temp 98.3°F | Wt 159.0 lb

## 2014-01-21 DIAGNOSIS — E119 Type 2 diabetes mellitus without complications: Secondary | ICD-10-CM

## 2014-01-21 DIAGNOSIS — R300 Dysuria: Secondary | ICD-10-CM

## 2014-01-21 LAB — POCT URINALYSIS DIPSTICK
Bilirubin, UA: NEGATIVE
Blood, UA: 3
Glucose, UA: NEGATIVE
Nitrite, UA: POSITIVE
Protein, UA: 3
Spec Grav, UA: 1.025
Urobilinogen, UA: 0.2
pH, UA: 6.5

## 2014-01-21 MED ORDER — SULFAMETHOXAZOLE-TMP DS 800-160 MG PO TABS: 1.0000 | ORAL_TABLET | Freq: Two times a day (BID) | ORAL | Status: DC

## 2014-01-21 NOTE — Patient Instructions (Signed)
This is a uti   As you siuspected  takd antibiotic   And fluids  Expect to feel much better with in 2-3 days or less Will notify you  When  Culture test is  Available. Sometimes we need to change antibiotic  Type.    Urinary Tract Infection Urinary tract infections (UTIs) can develop anywhere along your urinary tract. Your urinary tract is your body's drainage system for removing wastes and extra water. Your urinary tract includes two kidneys, two ureters, a bladder, and a urethra. Your kidneys are a pair of bean-shaped organs. Each kidney is about the size of your fist. They are located below your ribs, one on each side of your spine. CAUSES Infections are caused by microbes, which are microscopic organisms, including fungi, viruses, and bacteria. These organisms are so small that they can only be seen through a microscope. Bacteria are the microbes that most commonly cause UTIs. SYMPTOMS  Symptoms of UTIs may vary by age and gender of the patient and by the location of the infection. Symptoms in young women typically include a frequent and intense urge to urinate and a painful, burning feeling in the bladder or urethra during urination. Older women and men are more likely to be tired, shaky, and weak and have muscle aches and abdominal pain. A fever may mean the infection is in your kidneys. Other symptoms of a kidney infection include pain in your back or sides below the ribs, nausea, and vomiting. DIAGNOSIS To diagnose a UTI, your caregiver will ask you about your symptoms. Your caregiver also will ask to provide a urine sample. The urine sample will be tested for bacteria and white blood cells. White blood cells are made by your body to help fight infection. TREATMENT  Typically, UTIs can be treated with medication. Because most UTIs are caused by a bacterial infection, they usually can be treated with the use of antibiotics. The choice of antibiotic and length of treatment depend on your  symptoms and the type of bacteria causing your infection. HOME CARE INSTRUCTIONS  If you were prescribed antibiotics, take them exactly as your caregiver instructs you. Finish the medication even if you feel better after you have only taken some of the medication.  Drink enough water and fluids to keep your urine clear or pale yellow.  Avoid caffeine, tea, and carbonated beverages. They tend to irritate your bladder.  Empty your bladder often. Avoid holding urine for long periods of time.  Empty your bladder before and after sexual intercourse.  After a bowel movement, women should cleanse from front to back. Use each tissue only once. SEEK MEDICAL CARE IF:   You have back pain.  You develop a fever.  Your symptoms do not begin to resolve within 3 days. SEEK IMMEDIATE MEDICAL CARE IF:   You have severe back pain or lower abdominal pain.  You develop chills.  You have nausea or vomiting.  You have continued burning or discomfort with urination. MAKE SURE YOU:   Understand these instructions.  Will watch your condition.  Will get help right away if you are not doing well or get worse. Document Released: 04/03/2005 Document Revised: 12/24/2011 Document Reviewed: 08/02/2011 [facility] Patient Information 2015 [place], [place]. This information is not intended to replace advice given to you by your health care provider. Make sure you discuss any questions you have with your health care provider.

## 2014-01-21 NOTE — Progress Notes (Signed)
Pre visit review using our clinic review tool, if applicable. No additional management support is needed unless otherwise documented below in the visit note.   Chief Complaint  Patient presents with  . Dysuria    Sx started on 01/20/14.  Marland Kitchen Chills  . Urinary Frequency    HPI: Patient comes in today for SDA for  new problem evaluation. Onset yesterday of frequency urgency and dysuria hurts from urinary up to mid chest when voids  No fever but cold last pm and  righ tlow back pain .   Never had uti  Or kidney infection has some nausea but no v or d.   No meds for this  ROS: See pertinent positives and negatives per HPI.no cp  No allergy to antibiotic no change in bowel habits blood abd pain otherwise  Sugars are   OK  Past Medical History  Diagnosis Date  . Arthritis   . Diabetes mellitus   . Colon polyps   . Diverticulosis   . Hemorrhoids   . Dyslipidemia   . GERD (gastroesophageal reflux disease)     Family History  Problem Relation Age of Onset  . Colon cancer Neg Hx   . Stomach cancer Neg Hx   . Coronary artery disease Brother     MI in his 32s    History   Social History  . Marital Status: Married    Spouse Name: N/A    Number of Children: 2  . Years of Education: N/A   Social History Main Topics  . Smoking status: Never Smoker   . Smokeless tobacco: Never Used  . Alcohol Use: No  . Drug Use: No  . Sexual Activity: No   Other Topics Concern  . None   Social History Narrative   HH 2 married   Works for [employer] for 40 years retired   No pets     Outpatient Encounter Prescriptions as of 01/21/2014  Medication Sig  . aspirin 81 MG chewable tablet Chew 81 mg by mouth daily.    Marland Kitchen atorvastatin (LIPITOR) 10 MG tablet Take 1 tablet (10 mg total) by mouth daily. qd1  . Blood Glucose Calibration (OT ULTRA/FASTTK CNTRL SOLN) SOLN Use as instructed  . Calcium Carbonate-Vitamin D (CALCIUM 500 + D PO) Take by mouth 2 (two) times daily.   . Cholecalciferol  (VITAMIN D3) 2000 UNITS TABS Take by mouth. Taking 3 times weekly  . diazepam (VALIUM) 2 MG tablet Take one as needed for vertigo can repeat x 1 if needed maximum 3 per day.  . esomeprazole (NEXIUM) 40 MG capsule Take 1 capsule (40 mg total) by mouth daily before breakfast.  . furosemide (LASIX) 40 MG tablet Take 1 tablet (40 mg total) by mouth daily. For edema  . glipiZIDE (GLUCOTROL XL) 5 MG 24 hr tablet Take 1 tablet (5 mg total) by mouth daily.  Marland Kitchen glucose blood (ONE TOUCH ULTRA TEST) test strip Use as instructed;  . Lancets (ONETOUCH ULTRASOFT) lancets Use as instructed  . sulfamethoxazole-trimethoprim (BACTRIM DS) 800-160 MG per tablet Take 1 tablet by mouth 2 (two) times daily.    EXAM:  BP 144/60  Temp(Src) 98.3 F (36.8 C) (Oral)  Wt 159 lb (72.122 kg)  Body mass index is 30.06 kg/(m^2).  GENERAL: vitals reviewed and listed above, alert, oriented, appears well hydrated and in no acute distress coat no  Non toxic  HEENT: atraumatic, conjunctiva  clear, no obvious abnormalities on inspection of external nose and ears NECK: no  obvious masses on inspection palpation   CV: HRRR, no clubbing cyanosis or  peripheral edema nl cap refill  Abdomen:  Sof,t normal bowel sounds without hepatosplenomegaly, no guarding rebound or masses no CVA tenderness but ppoints to right LB no masses noted  MS: moves all extremities without noticeable focal  abnormality PSYCH: pleasant and cooperative, no obvious depression or anxiety oriented  Nl gait and affect   ASSESSMENT AND PLAN:  Discussed the following assessment and plan:  Dysuria - Plan: POCT urinalysis dipstick, Urine culture  Type II or unspecified type diabetes mellitus without mention of complication, not stated as uncontrolled dont think this is upper infection but does have anorexia with chills no fever   Not allergic to sulfa  rx for 5 days pending cx  Disc about  Dx and    Expectant management.  -Patient advised to return or notify  health care team  if symptoms worsen ,persist or new concerns arise.  Patient Instructions  This is a uti   As you siuspected  takd antibiotic   And fluids  Expect to feel much better with in 2-3 days or less Will notify you  When  Culture test is  Available. Sometimes we need to change antibiotic  Type.    Urinary Tract Infection Urinary tract infections (UTIs) can develop anywhere along your urinary tract. Your urinary tract is your body's drainage system for removing wastes and extra water. Your urinary tract includes two kidneys, two ureters, a bladder, and a urethra. Your kidneys are a pair of bean-shaped organs. Each kidney is about the size of your fist. They are located below your ribs, one on each side of your spine. CAUSES Infections are caused by microbes, which are microscopic organisms, including fungi, viruses, and bacteria. These organisms are so small that they can only be seen through a microscope. Bacteria are the microbes that most commonly cause UTIs. SYMPTOMS  Symptoms of UTIs may vary by age and gender of the patient and by the location of the infection. Symptoms in young women typically include a frequent and intense urge to urinate and a painful, burning feeling in the bladder or urethra during urination. Older women and men are more likely to be tired, shaky, and weak and have muscle aches and abdominal pain. A fever may mean the infection is in your kidneys. Other symptoms of a kidney infection include pain in your back or sides below the ribs, nausea, and vomiting. DIAGNOSIS To diagnose a UTI, your caregiver will ask you about your symptoms. Your caregiver also will ask to provide a urine sample. The urine sample will be tested for bacteria and white blood cells. White blood cells are made by your body to help fight infection. TREATMENT  Typically, UTIs can be treated with medication. Because most UTIs are caused by a bacterial infection, they usually can be treated with  the use of antibiotics. The choice of antibiotic and length of treatment depend on your symptoms and the type of bacteria causing your infection. HOME CARE INSTRUCTIONS  If you were prescribed antibiotics, take them exactly as your caregiver instructs you. Finish the medication even if you feel better after you have only taken some of the medication.  Drink enough water and fluids to keep your urine clear or pale yellow.  Avoid caffeine, tea, and carbonated beverages. They tend to irritate your bladder.  Empty your bladder often. Avoid holding urine for long periods of time.  Empty your bladder before and after sexual  intercourse.  After a bowel movement, women should cleanse from front to back. Use each tissue only once. SEEK MEDICAL CARE IF:   You have back pain.  You develop a fever.  Your symptoms do not begin to resolve within 3 days. SEEK IMMEDIATE MEDICAL CARE IF:   You have severe back pain or lower abdominal pain.  You develop chills.  You have nausea or vomiting.  You have continued burning or discomfort with urination. MAKE SURE YOU:   Understand these instructions.  Will watch your condition.  Will get help right away if you are not doing well or get worse. Document Released: 04/03/2005 Document Revised: 12/24/2011 Document Reviewed: 08/02/2011 [facility] Patient Information 2015 [place], [place]. This information is not intended to replace advice given to you by your health care provider. Make sure you discuss any questions you have with your health care provider.     Standley Brooking. Panosh M.D.  Total visit 20mins > 50% spent counseling and coordinating care first UTI  Underlying dm

## 2014-01-24 LAB — URINE CULTURE: Colony Count: >=100000

## 2014-01-24 NOTE — Progress Notes (Signed)
Quick Note:  Tell patient that urine culture shows e coli sensitive to medication given . Should resolve with current treatment .FU if not better. ______ 

## 2014-02-04 ENCOUNTER — Telehealth: Payer: Self-pay | Admitting: Internal Medicine

## 2014-02-04 MED ORDER — CEPHALEXIN 500 MG PO CAPS: 500.0000 mg | ORAL_CAPSULE | Freq: Three times a day (TID) | ORAL | Status: DC

## 2014-02-04 NOTE — Telephone Encounter (Signed)
Pt seen 7/17 for poss UTI. Pt given antibiotics, however, pt still having issues. Pt states it still hurts some to urinate, but the worse part is pt feels like she cannot empty her bladder all the way, pt states she was up and down all night last night. Pt would like refill on the antibiotic.  [facility]/ [place] and [address]

## 2014-02-04 NOTE — Telephone Encounter (Signed)
Sent to the pharmacy by e-scribe.  Pt notified and denies fever.

## 2014-02-24 ENCOUNTER — Encounter: Payer: Self-pay | Admitting: Internal Medicine

## 2014-02-24 ENCOUNTER — Ambulatory Visit (INDEPENDENT_AMBULATORY_CARE_PROVIDER_SITE_OTHER): Payer: Medicare Other | Admitting: Internal Medicine

## 2014-02-24 VITALS — BP 124/60 | HR 96 | Ht 61.0 in | Wt 163.0 lb

## 2014-02-24 DIAGNOSIS — K649 Unspecified hemorrhoids: Secondary | ICD-10-CM

## 2014-02-24 DIAGNOSIS — Z86010 Personal history of colonic polyps: Secondary | ICD-10-CM

## 2014-02-24 DIAGNOSIS — K219 Gastro-esophageal reflux disease without esophagitis: Secondary | ICD-10-CM | POA: Diagnosis not present

## 2014-02-24 DIAGNOSIS — K625 Hemorrhage of anus and rectum: Secondary | ICD-10-CM | POA: Diagnosis not present

## 2014-02-24 DIAGNOSIS — K648 Other hemorrhoids: Secondary | ICD-10-CM

## 2014-02-24 MED ORDER — HYDROCORTISONE ACETATE 25 MG RE SUPP: 25.0000 mg | Freq: Every day | RECTAL | Status: DC

## 2014-02-24 NOTE — Patient Instructions (Signed)
We have sent the following medications to your pharmacy for you to pick up at your convenience:  Anusol [facility] Suppositories  Per Dr. Henrene Pastor, mix 1-2 tablespoons of metamucil with juice or water daily.  You may also use sitz baths

## 2014-02-24 NOTE — Progress Notes (Signed)
HISTORY OF PRESENT ILLNESS:  [name] is a 80 y.o. female with the below listed medical history who is sent today regarding rectal bleeding. The patient has had multiple colonoscopies in the past. Most recent colonoscopy in June 2013 revealing moderate sigmoid diverticulosis, diminutive transverse colon polyp (serrated adenoma), and internal and external hemorrhoids. She sent today regarding rectal bleeding. Patient reports a 3 month history of scant bright red blood and mucus on the tissue only. No blood in the bowel movement or toilet bowl. No rectal pain. No change in bowel habits, abdominal pain, or weight loss. GI review of systems is otherwise negative. She does have a history of GERD which is well controlled with Nexium  REVIEW OF SYSTEMS:  All non-GI ROS negative except for sleeping problems, excessive urination, urinary frequency with pain  Past Medical History  Diagnosis Date  . Arthritis   . Diabetes mellitus   . Colon polyps     adenomatous  . Diverticulosis   . Hemorrhoids   . Dyslipidemia   . GERD (gastroesophageal reflux disease)   . Renal insufficiency   . DDD (degenerative disc disease)   . Shingles     Past Surgical History  Procedure Laterality Date  . Total shoulder arthroplasty Right 2010  . Total knee arthroplasty Right 2003  . Total knee arthroplasty Left 2010  . Umbilical hernia repair  1958  . Vaginal hysterectomy  1975  . Carpal tunnel release Right 1977  . Cataract extraction w/ intraocular lens  implant, bilateral Bilateral     left 1996; right 1997  . Ganglion cyst excision Right 2003    hand    Social History [name]  reports that she has never smoked. She has never used smokeless tobacco. She reports that she does not drink alcohol or use illicit drugs.  family history includes Coronary artery disease in her brother; Diabetes in her brother, daughter, father, mother, sister, and son; Heart attack in her brother; Heart disease in  her father; Pancreatic cancer in her mother; Prostate cancer in her brother. There is no history of Colon cancer or Stomach cancer.  No Known Allergies     PHYSICAL EXAMINATION: Vital signs: BP 124/60  Pulse 96  Ht 5\' 1"  (1.549 m)  Wt 163 lb (73.936 kg)  BMI 30.81 kg/m2  Constitutional: generally well-appearing elderly female, no acute distress Psychiatric: alert and oriented x3, cooperative Eyes: extraocular movements intact, anicteric, conjunctiva pink Mouth: oral pharynx moist, no lesions Neck: supple no lymphadenopathy Cardiovascular: heart regular rate and rhythm, no murmur Lungs: clear to auscultation bilaterally Abdomen: soft, obese, nontender, nondistended, no obvious ascites, no peritoneal signs, normal bowel sounds, no organomegaly Rectal: Obvious noninflamed external hemorrhoids. No mass or tenderness. Brown Hemoccult negative stool Extremities: no lower extremity edema bilaterally Skin: no lesions on visible extremities Neuro: No focal deficits.   ASSESSMENT:  #1. Minor rectal bleeding secondary to hemorrhoids #2. Multiple prior colonoscopies. Last examination 2013 as described. #3. GERD. Controlled with PPI #4. General medical problems   PLAN:  #1. Education on hemorrhoids #2. Metamucil one to 2 tablespoons daily #3. Prescribe Anusol suppositories at night #4. Sitz baths #5. Followup as needed. Return to the care of your PCP

## 2014-03-07 DIAGNOSIS — H40019 Open angle with borderline findings, low risk, unspecified eye: Secondary | ICD-10-CM | POA: Diagnosis not present

## 2014-04-28 ENCOUNTER — Ambulatory Visit: Payer: Medicare Other

## 2014-05-11 ENCOUNTER — Ambulatory Visit (INDEPENDENT_AMBULATORY_CARE_PROVIDER_SITE_OTHER): Payer: Medicare Other

## 2014-05-11 DIAGNOSIS — Z23 Encounter for immunization: Secondary | ICD-10-CM | POA: Diagnosis not present

## 2014-06-23 DIAGNOSIS — Z1231 Encounter for screening mammogram for malignant neoplasm of breast: Secondary | ICD-10-CM | POA: Diagnosis not present

## 2014-06-23 LAB — HM MAMMOGRAPHY

## 2014-06-24 ENCOUNTER — Encounter: Payer: Self-pay | Admitting: Family Medicine

## 2014-06-29 ENCOUNTER — Encounter: Payer: Self-pay | Admitting: Internal Medicine

## 2014-09-25 ENCOUNTER — Ambulatory Visit (INDEPENDENT_AMBULATORY_CARE_PROVIDER_SITE_OTHER): Payer: Medicare Other | Admitting: Family Medicine

## 2014-09-25 VITALS — BP 130/60 | HR 91 | Temp 97.7°F | Resp 16 | Ht 61.0 in | Wt 159.0 lb

## 2014-09-25 DIAGNOSIS — E119 Type 2 diabetes mellitus without complications: Secondary | ICD-10-CM

## 2014-09-25 DIAGNOSIS — N390 Urinary tract infection, site not specified: Secondary | ICD-10-CM

## 2014-09-25 DIAGNOSIS — R300 Dysuria: Secondary | ICD-10-CM

## 2014-09-25 DIAGNOSIS — A499 Bacterial infection, unspecified: Secondary | ICD-10-CM | POA: Diagnosis not present

## 2014-09-25 LAB — POCT URINALYSIS DIPSTICK
Bilirubin, UA: NEGATIVE
Glucose, UA: NEGATIVE
Ketones, UA: NEGATIVE
Nitrite, UA: POSITIVE
Spec Grav, UA: 1.015
Urobilinogen, UA: 0.2
pH, UA: 5.5

## 2014-09-25 LAB — POCT UA - MICROSCOPIC ONLY
Casts, Ur, LPF, POC: NEGATIVE
Crystals, Ur, HPF, POC: NEGATIVE
Mucus, UA: NEGATIVE
Yeast, UA: NEGATIVE

## 2014-09-25 LAB — POCT GLYCOSYLATED HEMOGLOBIN (HGB A1C): Hemoglobin A1C: 6.9

## 2014-09-25 LAB — GLUCOSE, POCT (MANUAL RESULT ENTRY): POC Glucose: 184 mg/dl — AB (ref 70–99)

## 2014-09-25 MED ORDER — CEPHALEXIN 500 MG PO CAPS: 500.0000 mg | ORAL_CAPSULE | Freq: Three times a day (TID) | ORAL | Status: DC

## 2014-09-25 NOTE — Patient Instructions (Signed)

## 2014-09-25 NOTE — Progress Notes (Signed)
Chief Complaint:  Chief Complaint  Patient presents with  . Dysuria    x1    HPI: [name] is a 81 y.o. female who is here for  dyuria last night and laso increase frequency, staterted last night , she feels a sense of urgency but cannot urinate very much. She had pretty significant urinary tract infection in July 2015 and urine culture crew out Escherichia coli. She she does not feel quite at once to catch this one before she starts having fevers, chills. She currently denies any fevers chills, nausea, vomiting, abdominal pain, back pain.  Her diabetes is well controlled, she does not check her sugars. She was last seen for diabetes and had her A1c checked in June 2015. There is been a lot of stress with her husband. He had to be hospitalized for congestive heart failure. She has not been watching her diet.   Lab Results  Component Value Date   HGBA1C 6.9 09/25/2014   HGBA1C 7.6* 12/16/2013   HGBA1C 7.2* 06/14/2013   Lab Results  Component Value Date   MICROALBUR 0.7 01/11/2013   LDLCALC 83 12/16/2013   CREATININE 1.3* 12/16/2013     Past Medical History  Diagnosis Date  . Arthritis   . Diabetes mellitus   . Colon polyps     adenomatous  . Diverticulosis   . Hemorrhoids   . Dyslipidemia   . GERD (gastroesophageal reflux disease)   . Renal insufficiency   . DDD (degenerative disc disease)   . Shingles    Past Surgical History  Procedure Laterality Date  . Total shoulder arthroplasty Right 2010  . Total knee arthroplasty Right 2003  . Total knee arthroplasty Left 2010  . Umbilical hernia repair  1958  . Vaginal hysterectomy  1975  . Carpal tunnel release Right 1977  . Cataract extraction w/ intraocular lens  implant, bilateral Bilateral     left 1996; right 1997  . Ganglion cyst excision Right 2003    hand   History   Social History  . Marital Status: Married    Spouse Name: N/A  . Number of Children: 2  . Years of Education: N/A    Occupational History  . Retired    Social History Main Topics  . Smoking status: Never Smoker   . Smokeless tobacco: Never Used  . Alcohol Use: No  . Drug Use: No  . Sexual Activity: No   Other Topics Concern  . None   Social History Narrative   HH 2 married   Works for [employer] for 40 years retired   No pets    Family History  Problem Relation Age of Onset  . Colon cancer Neg Hx   . Stomach cancer Neg Hx   . Coronary artery disease Brother     MI in his 49s  . Pancreatic cancer Mother   . Prostate cancer Brother   . Diabetes Mother   . Diabetes Father   . Diabetes Brother   . Diabetes Sister   . Diabetes Daughter   . Diabetes Son   . Heart attack Brother   . Heart disease Father    No Known Allergies Prior to Admission medications   Medication Sig Start Date End Date Taking? Authorizing Provider  aspirin 81 MG chewable tablet Chew 81 mg by mouth daily.     Yes Historical Provider, MD  atorvastatin (LIPITOR) 10 MG tablet Take 1 tablet (10 mg total) by mouth daily. qd1  12/16/13  Yes Burnis Medin, MD  Blood Glucose Calibration (OT ULTRA/FASTTK CNTRL SOLN) SOLN Use as instructed 12/23/12  Yes Burnis Medin, MD  Calcium Carbonate-Vitamin D (CALCIUM 500 + D PO) Take by mouth 2 (two) times daily.    Yes Historical Provider, MD  Cholecalciferol (VITAMIN D3) 2000 UNITS TABS Take by mouth. Taking 3 times weekly   Yes Historical Provider, MD  Cyanocobalamin (VITAMIN B-12 PO) Take 1 tablet by mouth daily.   Yes Historical Provider, MD  diazepam (VALIUM) 2 MG tablet Take one as needed for vertigo can repeat x 1 if needed maximum 3 per day. 12/23/12  Yes Burnis Medin, MD  esomeprazole (NEXIUM) 40 MG capsule Take 1 capsule (40 mg total) by mouth daily before breakfast. 12/16/13  Yes Burnis Medin, MD  furosemide (LASIX) 40 MG tablet Take 20 mg by mouth every other day. For edema 12/16/13  Yes Burnis Medin, MD  glipiZIDE (GLUCOTROL XL) 5 MG 24 hr tablet Take 1 tablet (5 mg  total) by mouth daily. 12/16/13  Yes Burnis Medin, MD  glucose blood (ONE TOUCH ULTRA TEST) test strip Use as instructed; 09/08/13  Yes Burnis Medin, MD  Lancets (ONETOUCH ULTRASOFT) lancets Use as instructed 09/08/13  Yes Burnis Medin, MD     ROS: The patient denies fevers, chills, night sweats, unintentional weight loss, chest pain, palpitations, wheezing, dyspnea on exertion, nausea, vomiting, abdominal pain,  hematuria, melena, numbness, weakness, or tingling.    All other systems have been reviewed and were otherwise negative with the exception of those mentioned in the HPI and as above.    PHYSICAL EXAM: Filed Vitals:   09/25/14 1316  BP: 130/60  Pulse: 91  Temp: 97.7 F (36.5 C)  Resp: 16   Filed Vitals:   09/25/14 1316  Height: 5\' 1"  (1.549 m)  Weight: 159 lb (72.122 kg)   Body mass index is 30.06 kg/(m^2).  General: Alert, no acute distress HEENT:  Normocephalic, atraumatic, oropharynx patent. EOMI, PERRLA Cardiovascular:  Regular rate and rhythm, no rubs murmurs or gallops.  Radial pulse intact. No pedal edema.  Respiratory: Clear to auscultation bilaterally.  No wheezes, rales, or rhonchi.  No cyanosis, no use of accessory musculature GI: No organomegaly, abdomen is soft and non-tender, positive bowel sounds.  No masses. Skin: No rashes. Neurologic: Facial musculature symmetric. Psychiatric: Patient is appropriate throughout our interaction. Lymphatic: No cervical lymphadenopathy Musculoskeletal: Gait intact. No CVA tenderness   LABS: Results for orders placed or performed in visit on 09/25/14  POCT urinalysis dipstick  Result Value Ref Range   Color, UA yellow    Clarity, UA cloudy    Glucose, UA neg    Bilirubin, UA neg    Ketones, UA neg    Spec Grav, UA 1.015    Blood, UA moderate    pH, UA 5.5    Protein, UA trace    Urobilinogen, UA 0.2    Nitrite, UA positive    Leukocytes, UA large (3+)   POCT UA - Microscopic Only  Result Value Ref Range    WBC, Ur, HPF, POC 30-35    RBC, urine, microscopic tntc    Bacteria, U Microscopic 4+    Mucus, UA neg    Epithelial cells, urine per micros 3-4    Crystals, Ur, HPF, POC neg    Casts, Ur, LPF, POC neg    Yeast, UA neg   POCT glycosylated hemoglobin (Hb A1C)  Result Value  Ref Range   Hemoglobin A1C 6.9   POCT glucose (manual entry)  Result Value Ref Range   POC Glucose 184 (A) 70 - 99 mg/dl     EKG/XRAY:   Primary read interpreted by Dr. Marin Comment at [facility]   ASSESSMENT/PLAN: Encounter Diagnoses  Name Primary?  . Dysuria Yes  . UTI (urinary tract infection), bacterial   . Type 2 diabetes mellitus without complication    Keflex 5 mg by mouth 3 times a day Urine Culture pending Follow-up when necessary Diabetes is well controlled, she will follow-up with her PCP for further evaluation.  Gross sideeffects, risk and benefits, and alternatives of medications d/w patient. Patient is aware that all medications have potential sideeffects and we are unable to predict every sideeffect or drug-drug interaction that may occur.  Jalal Rauch, [place], DO 09/25/2014 2:22 PM

## 2014-09-27 LAB — URINE CULTURE: Colony Count: >=100000

## 2014-10-04 ENCOUNTER — Telehealth: Payer: Self-pay | Admitting: Family Medicine

## 2014-10-04 MED ORDER — CEPHALEXIN 500 MG PO CAPS: 500.0000 mg | ORAL_CAPSULE | Freq: Three times a day (TID) | ORAL | Status: DC

## 2014-10-04 NOTE — Telephone Encounter (Signed)
LM that will extend Keflex for another 3 days for a total of 10

## 2014-11-14 DIAGNOSIS — H903 Sensorineural hearing loss, bilateral: Secondary | ICD-10-CM | POA: Diagnosis not present

## 2014-11-24 ENCOUNTER — Other Ambulatory Visit: Payer: Self-pay | Admitting: Internal Medicine

## 2014-11-24 NOTE — Telephone Encounter (Signed)
Sent to the pharmacy by e-scribe.  Pt has upcoming CPX in Oct 2016.

## 2014-11-28 ENCOUNTER — Other Ambulatory Visit: Payer: Self-pay | Admitting: Family Medicine

## 2014-11-29 MED ORDER — ATORVASTATIN CALCIUM 10 MG PO TABS: 10.0000 mg | ORAL_TABLET | Freq: Every day | ORAL | Status: DC

## 2014-11-29 MED ORDER — GLIPIZIDE ER 5 MG PO TB24: 5.0000 mg | ORAL_TABLET | Freq: Every day | ORAL | Status: DC

## 2014-11-29 MED ORDER — ESOMEPRAZOLE MAGNESIUM 40 MG PO CPDR: DELAYED_RELEASE_CAPSULE | ORAL | Status: DC

## 2014-11-29 MED ORDER — FUROSEMIDE 40 MG PO TABS: ORAL_TABLET | ORAL | Status: DC

## 2014-12-07 DIAGNOSIS — L299 Pruritus, unspecified: Secondary | ICD-10-CM | POA: Diagnosis not present

## 2014-12-07 DIAGNOSIS — L820 Inflamed seborrheic keratosis: Secondary | ICD-10-CM | POA: Diagnosis not present

## 2014-12-27 ENCOUNTER — Ambulatory Visit (INDEPENDENT_AMBULATORY_CARE_PROVIDER_SITE_OTHER): Payer: Medicare Other

## 2014-12-27 ENCOUNTER — Ambulatory Visit (INDEPENDENT_AMBULATORY_CARE_PROVIDER_SITE_OTHER): Payer: Medicare Other | Admitting: Family Medicine

## 2014-12-27 VITALS — BP 116/68 | HR 106 | Temp 98.4°F | Resp 17 | Ht 62.0 in | Wt 158.0 lb

## 2014-12-27 DIAGNOSIS — M545 Low back pain, unspecified: Secondary | ICD-10-CM

## 2014-12-27 DIAGNOSIS — R300 Dysuria: Secondary | ICD-10-CM | POA: Diagnosis not present

## 2014-12-27 DIAGNOSIS — E119 Type 2 diabetes mellitus without complications: Secondary | ICD-10-CM

## 2014-12-27 DIAGNOSIS — Z79899 Other long term (current) drug therapy: Secondary | ICD-10-CM | POA: Diagnosis not present

## 2014-12-27 DIAGNOSIS — H9201 Otalgia, right ear: Secondary | ICD-10-CM | POA: Diagnosis not present

## 2014-12-27 LAB — POCT URINALYSIS DIPSTICK
Bilirubin, UA: NEGATIVE
Blood, UA: NEGATIVE
Glucose, UA: NEGATIVE
Ketones, UA: NEGATIVE
Nitrite, UA: NEGATIVE
Protein, UA: NEGATIVE
Spec Grav, UA: 1.01
Urobilinogen, UA: 0.2
pH, UA: 5

## 2014-12-27 LAB — POCT UA - MICROSCOPIC ONLY
Casts, Ur, LPF, POC: NEGATIVE
Crystals, Ur, HPF, POC: NEGATIVE
Yeast, UA: NEGATIVE

## 2014-12-27 LAB — POCT GLYCOSYLATED HEMOGLOBIN (HGB A1C): Hemoglobin A1C: 7

## 2014-12-27 MED ORDER — CEPHALEXIN 500 MG PO CAPS: 500.0000 mg | ORAL_CAPSULE | Freq: Two times a day (BID) | ORAL | Status: DC

## 2014-12-27 NOTE — Progress Notes (Signed)
 Chief Complaint:  Chief Complaint  Patient presents with  . Dysuria    3-4 days   . Flank Pain    HPI: [name] is a 81 y.o. female who is here for a recheck on her UTI symptoms and also flank pain. Still has some burning off-and-on since March, mostly, sometimes is incontinent but fo rthe most part she is not incontinent. Denies fevers chills, nausea, abd pain, in March she had an Escherichia coli infection She grew put ecoli and was on keflex.  She has had left sided back/flank pain. It is dull and achy, she notices it when she moves. This been going on for the last 3 weeks and she does have arthritis. No known injury. Again denies nausea vomiting abdominal pain or fevers.  Denies any hematuria or melena. No rashes. She's had the shingles before and this does not feel like it. She does not have an appt with Dr Regis Bill until October. Her diabetes is well under control. Denies neuropathy, CP, SOB, hypoglycemia, palpitations, presyncope/syncope, polydipsia, polyuria, nausea, vomiting, abd pain She is up-to-date on her eye exam  Lab Results  Component Value Date   HGBA1C 7.0 12/27/2014   HGBA1C 6.9 09/25/2014   HGBA1C 7.6* 12/16/2013   Lab Results  Component Value Date   MICROALBUR <0.2 12/27/2014   LDLCALC 83 12/16/2013   CREATININE 1.43* 12/27/2014     Past Medical History  Diagnosis Date  . Arthritis   . Diabetes mellitus   . Colon polyps     adenomatous  . Diverticulosis   . Hemorrhoids   . Dyslipidemia   . GERD (gastroesophageal reflux disease)   . Renal insufficiency   . DDD (degenerative disc disease)   . Shingles    Past Surgical History  Procedure Laterality Date  . Total shoulder arthroplasty Right 2010  . Total knee arthroplasty Right 2003  . Total knee arthroplasty Left 2010  . Umbilical hernia repair  1958  . Vaginal hysterectomy  1975  . Carpal tunnel release Right 1977  . Cataract extraction w/ intraocular lens  implant, bilateral  Bilateral     left 1996; right 1997  . Ganglion cyst excision Right 2003    hand   History   Social History  . Marital Status: Married    Spouse Name: N/A  . Number of Children: 2  . Years of Education: N/A   Occupational History  . Retired    Social History Main Topics  . Smoking status: Never Smoker   . Smokeless tobacco: Never Used  . Alcohol Use: No  . Drug Use: No  . Sexual Activity: No   Other Topics Concern  . None   Social History Narrative   HH 2 married   Works for [employer] for 40 years retired   No pets    Family History  Problem Relation Age of Onset  . Colon cancer Neg Hx   . Stomach cancer Neg Hx   . Coronary artery disease Brother     MI in his 75s  . Pancreatic cancer Mother   . Prostate cancer Brother   . Diabetes Mother   . Diabetes Father   . Diabetes Brother   . Diabetes Sister   . Diabetes Daughter   . Diabetes Son   . Heart attack Brother   . Heart disease Father    No Known Allergies Prior to Admission medications   Medication Sig Start Date End Date Taking? Authorizing Provider  aspirin 81 MG chewable tablet Chew 81 mg by mouth daily.     Yes Historical Provider, MD  atorvastatin (LIPITOR) 10 MG tablet Take 1 tablet (10 mg total) by mouth daily. 11/29/14  Yes Burnis Medin, MD  Blood Glucose Calibration (OT ULTRA/FASTTK CNTRL SOLN) SOLN Use as instructed 12/23/12  Yes Burnis Medin, MD  Calcium Carbonate-Vitamin D (CALCIUM 500 + D PO) Take by mouth 2 (two) times daily.    Yes Historical Provider, MD  cephALEXin (KEFLEX) 500 MG capsule Take 1 capsule (500 mg total) by mouth 3 (three) times daily. 10/04/14  Yes  P , DO  Cholecalciferol (VITAMIN D3) 2000 UNITS TABS Take by mouth. Taking 3 times weekly   Yes Historical Provider, MD  Cyanocobalamin (VITAMIN B-12 PO) Take 1 tablet by mouth daily.   Yes Historical Provider, MD  diazepam (VALIUM) 2 MG tablet Take one as needed for vertigo can repeat x 1 if needed maximum 3 per day.  12/23/12  Yes Burnis Medin, MD  esomeprazole (NEXIUM) 40 MG capsule TAKE 1 CAPSULE DAILY BEFOREBREAKFAST 11/29/14  Yes Burnis Medin, MD  furosemide (LASIX) 40 MG tablet Take 20 mg by mouth every other day. For edema 12/16/13  Yes Burnis Medin, MD  furosemide (LASIX) 40 MG tablet TAKE 1 TABLET DAILY FOR    EDEMA 11/29/14  Yes Burnis Medin, MD  glipiZIDE (GLUCOTROL XL) 5 MG 24 hr tablet Take 1 tablet (5 mg total) by mouth daily. 11/29/14  Yes Burnis Medin, MD  glucose blood (ONE TOUCH ULTRA TEST) test strip Use as instructed; 09/08/13  Yes Burnis Medin, MD  Lancets (ONETOUCH ULTRASOFT) lancets Use as instructed 09/08/13  Yes Burnis Medin, MD     ROS: The patient denies fevers, chills, night sweats, unintentional weight loss, chest pain, palpitations, wheezing, dyspnea on exertion, nausea, vomiting, abdominal pain, dysuria, hematuria, melena, numbness, weakness, or tingling.   All other systems have been reviewed and were otherwise negative with the exception of those mentioned in the HPI and as above.    PHYSICAL EXAM: Filed Vitals:   12/27/14 1721  BP: 116/68  Pulse: 106  Temp: 98.4 F (36.9 C)  Resp: 17   Filed Vitals:   12/27/14 1721  Height: 5\' 2"  (1.575 m)  Weight: 158 lb (71.668 kg)   Body mass index is 28.89 kg/(m^2).   General: Alert, no acute distress HEENT:  Normocephalic, atraumatic, oropharynx patent. EOMI, PERRLA Cardiovascular:  Regular rate and rhythm, no rubs murmurs or gallops.  No Carotid bruits, radial pulse intact. No pedal edema.  Respiratory: Clear to auscultation bilaterally.  No wheezes, rales, or rhonchi.  No cyanosis, no use of accessory musculature GI: No organomegaly, abdomen is soft and non-tender, positive bowel sounds.  No masses. Skin: No rashes. Neurologic: Facial musculature symmetric. Psychiatric: Patient is appropriate throughout our interaction. Lymphatic: No cervical lymphadenopathy Musculoskeletal: Gait intact.   LABS: Results for  orders placed or performed in visit on 12/27/14  POCT UA - Microscopic Only  Result Value Ref Range   WBC, Ur, HPF, POC 2-4    RBC, urine, microscopic 0-2    Bacteria, U Microscopic trace    Mucus, UA trace    Epithelial cells, urine per micros 1-3    Crystals, Ur, HPF, POC neg    Casts, Ur, LPF, POC neg    Yeast, UA neg   POCT urinalysis dipstick  Result Value Ref Range   Color, UA yellow  Clarity, UA clear    Glucose, UA neg    Bilirubin, UA neg    Ketones, UA neg    Spec Grav, UA 1.010    Blood, UA neg    pH, UA 5.0    Protein, UA neg    Urobilinogen, UA 0.2    Nitrite, UA neg    Leukocytes, UA Trace (A) Negative  POCT glycosylated hemoglobin (Hb A1C)  Result Value Ref Range   Hemoglobin A1C 7.0      EKG/XRAY:   Primary read interpreted by Dr. Marin Comment at [facility]. Neg for fx or dislocation, no renal stones    ASSESSMENT/PLAN: Encounter Diagnoses  Name Primary?  . Dysuria Yes  . Medication management   . Type 2 diabetes mellitus without complication   . Left-sided low back pain without sciatica    Rx Keflex Urine culture pending  Labs pending Follow-up as needed  Gross sideeffects, risk and benefits, and alternatives of medications d/w patient. Patient is aware that all medications have potential sideeffects and we are unable to predict every sideeffect or drug-drug interaction that may occur.  , [place], DO 12/27/2014 7:29 PM

## 2014-12-28 LAB — COMPLETE METABOLIC PANEL WITH GFR
ALT: 16 U/L (ref 0–35)
AST: 14 U/L (ref 0–37)
Albumin: 4.3 g/dL (ref 3.5–5.2)
Alkaline Phosphatase: 86 U/L (ref 39–117)
BUN: 26 mg/dL — ABNORMAL HIGH (ref 6–23)
CO2: 29 mEq/L (ref 19–32)
Calcium: 9.7 mg/dL (ref 8.4–10.5)
Chloride: 101 mEq/L (ref 96–112)
Creat: 1.43 mg/dL — ABNORMAL HIGH (ref 0.50–1.10)
GFR, Est African American: 39 mL/min — ABNORMAL LOW
GFR, Est Non African American: 34 mL/min — ABNORMAL LOW
Glucose, Bld: 139 mg/dL — ABNORMAL HIGH (ref 70–99)
Potassium: 4 mEq/L (ref 3.5–5.3)
Sodium: 138 mEq/L (ref 135–145)
Total Bilirubin: 0.4 mg/dL (ref 0.2–1.2)
Total Protein: 7.2 g/dL (ref 6.0–8.3)

## 2014-12-28 LAB — MICROALBUMIN, URINE: Microalb, Ur: <0.2 mg/dL (ref <2.0)

## 2014-12-29 LAB — URINE CULTURE: Colony Count: 15000

## 2015-02-14 ENCOUNTER — Other Ambulatory Visit: Payer: Self-pay | Admitting: Family Medicine

## 2015-02-14 MED ORDER — ONETOUCH ULTRASOFT LANCETS MISC: Status: DC

## 2015-02-14 MED ORDER — GLUCOSE BLOOD VI STRP
ORAL_STRIP | Status: DC
Start: 2015-02-14 — End: 2015-10-10

## 2015-03-16 DIAGNOSIS — H40013 Open angle with borderline findings, low risk, bilateral: Secondary | ICD-10-CM | POA: Diagnosis not present

## 2015-03-16 DIAGNOSIS — Z961 Presence of intraocular lens: Secondary | ICD-10-CM | POA: Diagnosis not present

## 2015-03-16 DIAGNOSIS — E119 Type 2 diabetes mellitus without complications: Secondary | ICD-10-CM | POA: Diagnosis not present

## 2015-03-16 DIAGNOSIS — H524 Presbyopia: Secondary | ICD-10-CM | POA: Diagnosis not present

## 2015-03-16 DIAGNOSIS — H35033 Hypertensive retinopathy, bilateral: Secondary | ICD-10-CM | POA: Diagnosis not present

## 2015-03-16 LAB — HM DIABETES EYE EXAM

## 2015-03-20 ENCOUNTER — Encounter: Payer: Self-pay | Admitting: Family Medicine

## 2015-04-27 ENCOUNTER — Telehealth: Payer: Self-pay | Admitting: Family Medicine

## 2015-04-27 NOTE — Telephone Encounter (Signed)
Left a message for patient to return call to see if Dr. Regis Bill is her primary doctor or if she sees [name] for her primary care.  If she sees [name], she needs to set up an appointment to document fall history

## 2015-05-01 ENCOUNTER — Encounter: Payer: Self-pay | Admitting: Internal Medicine

## 2015-05-01 ENCOUNTER — Ambulatory Visit (INDEPENDENT_AMBULATORY_CARE_PROVIDER_SITE_OTHER): Payer: Medicare Other | Admitting: Internal Medicine

## 2015-05-01 VITALS — BP 130/66 | Temp 97.8°F | Ht 60.75 in | Wt 158.5 lb

## 2015-05-01 DIAGNOSIS — N289 Disorder of kidney and ureter, unspecified: Secondary | ICD-10-CM

## 2015-05-01 DIAGNOSIS — K219 Gastro-esophageal reflux disease without esophagitis: Secondary | ICD-10-CM

## 2015-05-01 DIAGNOSIS — M1990 Unspecified osteoarthritis, unspecified site: Secondary | ICD-10-CM

## 2015-05-01 DIAGNOSIS — Z0000 Encounter for general adult medical examination without abnormal findings: Secondary | ICD-10-CM | POA: Diagnosis not present

## 2015-05-01 DIAGNOSIS — E785 Hyperlipidemia, unspecified: Secondary | ICD-10-CM | POA: Diagnosis not present

## 2015-05-01 DIAGNOSIS — E119 Type 2 diabetes mellitus without complications: Secondary | ICD-10-CM | POA: Diagnosis not present

## 2015-05-01 DIAGNOSIS — G4700 Insomnia, unspecified: Secondary | ICD-10-CM

## 2015-05-01 DIAGNOSIS — Z23 Encounter for immunization: Secondary | ICD-10-CM | POA: Diagnosis not present

## 2015-05-01 DIAGNOSIS — Z79899 Other long term (current) drug therapy: Secondary | ICD-10-CM

## 2015-05-01 DIAGNOSIS — Z974 Presence of external hearing-aid: Secondary | ICD-10-CM | POA: Diagnosis not present

## 2015-05-01 LAB — HEPATIC FUNCTION PANEL
ALT: 13 U/L (ref 0–35)
AST: 13 U/L (ref 0–37)
Albumin: 4.2 g/dL (ref 3.5–5.2)
Alkaline Phosphatase: 90 U/L (ref 39–117)
Bilirubin, Direct: 0.1 mg/dL (ref 0.0–0.3)
Total Bilirubin: 0.6 mg/dL (ref 0.2–1.2)
Total Protein: 7.1 g/dL (ref 6.0–8.3)

## 2015-05-01 LAB — BASIC METABOLIC PANEL
BUN: 26 mg/dL — ABNORMAL HIGH (ref 6–23)
CO2: 30 mEq/L (ref 19–32)
Calcium: 9.7 mg/dL (ref 8.4–10.5)
Chloride: 104 mEq/L (ref 96–112)
Creatinine, Ser: 1.25 mg/dL — ABNORMAL HIGH (ref 0.40–1.20)
GFR: 43.48 mL/min — ABNORMAL LOW (ref >60.00)
Glucose, Bld: 157 mg/dL — ABNORMAL HIGH (ref 70–99)
Potassium: 4.4 mEq/L (ref 3.5–5.1)
Sodium: 142 mEq/L (ref 135–145)

## 2015-05-01 LAB — POCT URINALYSIS DIP (MANUAL ENTRY)
Bilirubin, UA: NEGATIVE
Blood, UA: NEGATIVE
Glucose, UA: NEGATIVE
Ketones, POC UA: NEGATIVE
Nitrite, UA: NEGATIVE
Protein Ur, POC: 100 — AB
Spec Grav, UA: 1.02
Urobilinogen, UA: 0.2
pH, UA: 6

## 2015-05-01 LAB — LIPID PANEL
Cholesterol: 170 mg/dL (ref 0–200)
HDL: 36.2 mg/dL — ABNORMAL LOW (ref >39.00)
NonHDL: 133.49
Total CHOL/HDL Ratio: 5
Triglycerides: 242 mg/dL — ABNORMAL HIGH (ref 0.0–149.0)
VLDL: 48.4 mg/dL — ABNORMAL HIGH (ref 0.0–40.0)

## 2015-05-01 LAB — CBC WITH DIFFERENTIAL/PLATELET
Basophils Absolute: 0 10*3/uL (ref 0.0–0.1)
Basophils Relative: 0.9 % (ref 0.0–3.0)
Eosinophils Absolute: 0.2 10*3/uL (ref 0.0–0.7)
Eosinophils Relative: 5.6 % — ABNORMAL HIGH (ref 0.0–5.0)
HCT: 38.9 % (ref 36.0–46.0)
Hemoglobin: 12.9 g/dL (ref 12.0–15.0)
Lymphocytes Relative: 38.5 % (ref 12.0–46.0)
Lymphs Abs: 1.5 10*3/uL (ref 0.7–4.0)
MCHC: 33.3 g/dL (ref 30.0–36.0)
MCV: 89 fl (ref 78.0–100.0)
Monocytes Absolute: 0.4 10*3/uL (ref 0.1–1.0)
Monocytes Relative: 9.4 % (ref 3.0–12.0)
Neutro Abs: 1.8 10*3/uL (ref 1.4–7.7)
Neutrophils Relative %: 45.6 % (ref 43.0–77.0)
Platelets: 223 10*3/uL (ref 150.0–400.0)
RBC: 4.37 Mil/uL (ref 3.87–5.11)
RDW: 14.2 % (ref 11.5–15.5)
WBC: 4 10*3/uL (ref 4.0–10.5)

## 2015-05-01 LAB — LDL CHOLESTEROL, DIRECT: Direct LDL: 85 mg/dL

## 2015-05-01 LAB — T4, FREE: Free T4: 0.7 ng/dL (ref 0.60–1.60)

## 2015-05-01 LAB — HEMOGLOBIN A1C: Hgb A1c MFr Bld: 7.4 % — ABNORMAL HIGH (ref 4.6–6.5)

## 2015-05-01 LAB — TSH: TSH: 2.06 u[IU]/mL (ref 0.35–4.50)

## 2015-05-01 MED ORDER — GLIPIZIDE ER 5 MG PO TB24: 5.0000 mg | ORAL_TABLET | Freq: Every day | ORAL | Status: DC

## 2015-05-01 MED ORDER — GABAPENTIN 300 MG PO CAPS: 300.0000 mg | ORAL_CAPSULE | Freq: Every day | ORAL | Status: DC

## 2015-05-01 MED ORDER — ONETOUCH LANCETS MISC: Status: DC

## 2015-05-01 MED ORDER — GLUCOSE BLOOD VI STRP: ORAL_STRIP | Status: DC

## 2015-05-01 MED ORDER — ATORVASTATIN CALCIUM 10 MG PO TABS: 10.0000 mg | ORAL_TABLET | Freq: Every day | ORAL | Status: DC

## 2015-05-01 MED ORDER — ESOMEPRAZOLE MAGNESIUM 40 MG PO CPDR: DELAYED_RELEASE_CAPSULE | ORAL | Status: DC

## 2015-05-01 MED ORDER — FUROSEMIDE 40 MG PO TABS: ORAL_TABLET | ORAL | Status: DC

## 2015-05-01 NOTE — Patient Instructions (Addendum)
Will notify you  of labs when available. Will refill you meds  As discusse d temazepam is a high risk medication  For falls and memory fogginess .  We need to try something else to help with sleep    Because of   The restless ness you get lets try gabapentin at night  For a month or se and see if helps. Can cause drowsiness that resolves with time.   Try alternating  nexium with  Ranitidine orzantac or famotidine pepcid otc on days to wean .     Insomnia Insomnia is a sleep disorder that makes it difficult to fall asleep or to stay asleep. Insomnia can cause tiredness (fatigue), low energy, difficulty concentrating, mood swings, and poor performance at work or school.  There are three different ways to classify insomnia:  Difficulty falling asleep.  Difficulty staying asleep.  Waking up too early in the morning. Any type of insomnia can be long-term (chronic) or short-term (acute). Both are common. Short-term insomnia usually lasts for three months or less. Chronic insomnia occurs at least three times a week for longer than three months. CAUSES  Insomnia may be caused by another condition, situation, or substance, such as:  Anxiety.  Certain medicines.  Gastroesophageal reflux disease (GERD) or other gastrointestinal conditions.  Asthma or other breathing conditions.  Restless legs syndrome, sleep apnea, or other sleep disorders.  Chronic pain.  Menopause. This may include hot flashes.  Stroke.  Abuse of alcohol, tobacco, or illegal drugs.  Depression.  Caffeine.   Neurological disorders, such as Alzheimer disease.  An overactive thyroid (hyperthyroidism). The cause of insomnia may not be known. RISK FACTORS Risk factors for insomnia include:  Gender. Women are more commonly affected than men.  Age. Insomnia is more common as you get older.  Stress. This may involve your professional or personal life.  Income. Insomnia is more common in people with lower  income.  Lack of exercise.   Irregular work schedule or night shifts.  Traveling between different time zones. SIGNS AND SYMPTOMS If you have insomnia, trouble falling asleep or trouble staying asleep is the main symptom. This may lead to other symptoms, such as:  Feeling fatigued.  Feeling nervous about going to sleep.  Not feeling rested in the morning.  Having trouble concentrating.  Feeling irritable, anxious, or depressed. TREATMENT  Treatment for insomnia depends on the cause. If your insomnia is caused by an underlying condition, treatment will focus on addressing the condition. Treatment may also include:   Medicines to help you sleep.  Counseling or therapy.  Lifestyle adjustments. HOME CARE INSTRUCTIONS   Take medicines only as directed by your health care provider.  Keep regular sleeping and waking hours. Avoid naps.  Keep a sleep diary to help you and your health care provider figure out what could be causing your insomnia. Include:   When you sleep.  When you wake up during the night.  How well you sleep.   How rested you feel the next day.  Any side effects of medicines you are taking.  What you eat and drink.   Make your bedroom a comfortable place where it is easy to fall asleep:  Put up shades or special blackout curtains to block light from outside.  Use a white noise machine to block noise.  Keep the temperature cool.   Exercise regularly as directed by your health care provider. Avoid exercising right before bedtime.  Use relaxation techniques to manage stress. Ask your  health care provider to suggest some techniques that may work well for you. These may include:  Breathing exercises.  Routines to release muscle tension.  Visualizing peaceful scenes.  Cut back on alcohol, caffeinated beverages, and cigarettes, especially close to bedtime. These can disrupt your sleep.  Do not overeat or eat spicy foods right before bedtime.  This can lead to digestive discomfort that can make it hard for you to sleep.  Limit screen use before bedtime. This includes:  Watching TV.  Using your smartphone, tablet, and computer.  Stick to a routine. This can help you fall asleep faster. Try to do a quiet activity, brush your teeth, and go to bed at the same time each night.  Get out of bed if you are still awake after 15 minutes of trying to sleep. Keep the lights down, but try reading or doing a quiet activity. When you feel sleepy, go back to bed.  Make sure that you drive carefully. Avoid driving if you feel very sleepy.  Keep all follow-up appointments as directed by your health care provider. This is important. SEEK MEDICAL CARE IF:   You are tired throughout the day or have trouble in your daily routine due to sleepiness.  You continue to have sleep problems or your sleep problems get worse. SEEK IMMEDIATE MEDICAL CARE IF:   You have serious thoughts about hurting yourself or someone else.   This information is not intended to replace advice given to you by your health care provider. Make sure you discuss any questions you have with your health care provider.   Document Released: 06/21/2000 Document Revised: 03/15/2015 Document Reviewed: 03/25/2014 Elsevier Interactive Patient Education 2016 [place] Maintenance, Female Adopting a healthy lifestyle and getting preventive care can go a long way to promote health and wellness. Talk with your health care provider about what schedule of regular examinations is right for you. This is a good chance for you to check in with your provider about disease prevention and staying healthy. In between checkups, there are plenty of things you can do on your own. Experts have done a lot of research about which lifestyle changes and preventive measures are most likely to keep you healthy. Ask your health care provider for more information. WEIGHT AND DIET  Eat a healthy  diet  Be sure to include plenty of vegetables, fruits, low-fat dairy products, and lean protein.  Do not eat a lot of foods high in solid fats, added sugars, or salt.  Get regular exercise. This is one of the most important things you can do for your health.  Most adults should exercise for at least 150 minutes each week. The exercise should increase your heart rate and make you sweat (moderate-intensity exercise).  Most adults should also do strengthening exercises at least twice a week. This is in addition to the moderate-intensity exercise.  Maintain a healthy weight  Body mass index (BMI) is a measurement that can be used to identify possible weight problems. It estimates body fat based on height and weight. Your health care provider can help determine your BMI and help you achieve or maintain a healthy weight.  For females 44 years of age and older:   A BMI below 18.5 is considered underweight.  A BMI of 18.5 to 24.9 is normal.  A BMI of 25 to 29.9 is considered overweight.  A BMI of 30 and above is considered obese.  Watch levels of cholesterol and blood lipids  You  should start having your blood tested for lipids and cholesterol at 79 years of age, then have this test every 5 years.  You may need to have your cholesterol levels checked more often if:  Your lipid or cholesterol levels are high.  You are older than 79 years of age.  You are at high risk for heart disease.  CANCER SCREENING   Lung Cancer  Lung cancer screening is recommended for adults 36-42 years old who are at high risk for lung cancer because of a history of smoking.  A yearly low-dose CT scan of the lungs is recommended for people who:  Currently smoke.  Have quit within the past 15 years.  Have at least a 30-pack-year history of smoking. A pack year is smoking an average of one pack of cigarettes a day for 1 year.  Yearly screening should continue until it has been 15 years since you  quit.  Yearly screening should stop if you develop a health problem that would prevent you from having lung cancer treatment.  Breast Cancer  Practice breast self-awareness. This means understanding how your breasts normally appear and feel.  It also means doing regular breast self-exams. Let your health care provider know about any changes, no matter how small.  If you are in your 20s or 30s, you should have a clinical breast exam (CBE) by a health care provider every 1-3 years as part of a regular health exam.  If you are 66 or older, have a CBE every year. Also consider having a breast X-ray (mammogram) every year.  If you have a family history of breast cancer, talk to your health care provider about genetic screening.  If you are at high risk for breast cancer, talk to your health care provider about having an MRI and a mammogram every year.  Breast cancer gene (BRCA) assessment is recommended for women who have family members with BRCA-related cancers. BRCA-related cancers include:  Breast.  Ovarian.  Tubal.  Peritoneal cancers.  Results of the assessment will determine the need for genetic counseling and BRCA1 and BRCA2 testing. Cervical Cancer Your health care provider may recommend that you be screened regularly for cancer of the pelvic organs (ovaries, uterus, and vagina). This screening involves a pelvic examination, including checking for microscopic changes to the surface of your cervix (Pap test). You may be encouraged to have this screening done every 3 years, beginning at age 4.  For women ages 79-65, health care providers may recommend pelvic exams and Pap testing every 3 years, or they may recommend the Pap and pelvic exam, combined with testing for human papilloma virus (HPV), every 5 years. Some types of HPV increase your risk of cervical cancer. Testing for HPV may also be done on women of any age with unclear Pap test results.  Other health care providers may  not recommend any screening for nonpregnant women who are considered low risk for pelvic cancer and who do not have symptoms. Ask your health care provider if a screening pelvic exam is right for you.  If you have had past treatment for cervical cancer or a condition that could lead to cancer, you need Pap tests and screening for cancer for at least 20 years after your treatment. If Pap tests have been discontinued, your risk factors (such as having a new sexual partner) need to be reassessed to determine if screening should resume. Some women have medical problems that increase the chance of getting cervical cancer. In these  cases, your health care provider may recommend more frequent screening and Pap tests. Colorectal Cancer  This type of cancer can be detected and often prevented.  Routine colorectal cancer screening usually begins at 79 years of age and continues through 79 years of age.  Your health care provider may recommend screening at an earlier age if you have risk factors for colon cancer.  Your health care provider may also recommend using home test kits to check for hidden blood in the stool.  A small camera at the end of a tube can be used to examine your colon directly (sigmoidoscopy or colonoscopy). This is done to check for the earliest forms of colorectal cancer.  Routine screening usually begins at age 63.  Direct examination of the colon should be repeated every 5-10 years through 79 years of age. However, you may need to be screened more often if early forms of precancerous polyps or small growths are found. Skin Cancer  Check your skin from head to toe regularly.  Tell your health care provider about any new moles or changes in moles, especially if there is a change in a mole's shape or color.  Also tell your health care provider if you have a mole that is larger than the size of a pencil eraser.  Always use sunscreen. Apply sunscreen liberally and repeatedly  throughout the day.  Protect yourself by wearing long sleeves, pants, a wide-brimmed hat, and sunglasses whenever you are outside. HEART DISEASE, DIABETES, AND HIGH BLOOD PRESSURE   High blood pressure causes heart disease and increases the risk of stroke. High blood pressure is more likely to develop in:  People who have blood pressure in the high end of the normal range (130-139/85-89 mm Hg).  People who are overweight or obese.  People who are African American.  If you are 24-64 years of age, have your blood pressure checked every 3-5 years. If you are 66 years of age or older, have your blood pressure checked every year. You should have your blood pressure measured twice--once when you are at a hospital or clinic, and once when you are not at a hospital or clinic. Record the average of the two measurements. To check your blood pressure when you are not at a hospital or clinic, you can use:  An automated blood pressure machine at a pharmacy.  A home blood pressure monitor.  If you are between 39 years and 68 years old, ask your health care provider if you should take aspirin to prevent strokes.  Have regular diabetes screenings. This involves taking a blood sample to check your fasting blood sugar level.  If you are at a normal weight and have a low risk for diabetes, have this test once every three years after 79 years of age.  If you are overweight and have a high risk for diabetes, consider being tested at a younger age or more often. PREVENTING INFECTION  Hepatitis B  If you have a higher risk for hepatitis B, you should be screened for this virus. You are considered at high risk for hepatitis B if:  You were born in a country where hepatitis B is common. Ask your health care provider which countries are considered high risk.  Your parents were born in a high-risk country, and you have not been immunized against hepatitis B (hepatitis B vaccine).  You have HIV or  AIDS.  You use needles to inject street drugs.  You live with someone who has hepatitis  B.  You have had sex with someone who has hepatitis B.  You get hemodialysis treatment.  You take certain medicines for conditions, including cancer, organ transplantation, and autoimmune conditions. Hepatitis C  Blood testing is recommended for:  Everyone born from [birth year] through 1963.  Anyone with known risk factors for hepatitis C. Sexually transmitted infections (STIs)  You should be screened for sexually transmitted infections (STIs) including gonorrhea and chlamydia if:  You are sexually active and are younger than 79 years of age.  You are older than 79 years of age and your health care provider tells you that you are at risk for this type of infection.  Your sexual activity has changed since you were last screened and you are at an increased risk for chlamydia or gonorrhea. Ask your health care provider if you are at risk.  If you do not have HIV, but are at risk, it may be recommended that you take a prescription medicine daily to prevent HIV infection. This is called pre-exposure prophylaxis (PrEP). You are considered at risk if:  You are sexually active and do not regularly use condoms or know the HIV status of your partner(s).  You take drugs by injection.  You are sexually active with a partner who has HIV. Talk with your health care provider about whether you are at high risk of being infected with HIV. If you choose to begin PrEP, you should first be tested for HIV. You should then be tested every 3 months for as long as you are taking PrEP.  PREGNANCY   If you are premenopausal and you may become pregnant, ask your health care provider about preconception counseling.  If you may become pregnant, take 400 to 800 micrograms (mcg) of folic acid every day.  If you want to prevent pregnancy, talk to your health care provider about birth control (contraception). OSTEOPOROSIS AND  MENOPAUSE   Osteoporosis is a disease in which the bones lose minerals and strength with aging. This can result in serious bone fractures. Your risk for osteoporosis can be identified using a bone density scan.  If you are 62 years of age or older, or if you are at risk for osteoporosis and fractures, ask your health care provider if you should be screened.  Ask your health care provider whether you should take a calcium or vitamin D supplement to lower your risk for osteoporosis.  Menopause may have certain physical symptoms and risks.  Hormone replacement therapy may reduce some of these symptoms and risks. Talk to your health care provider about whether hormone replacement therapy is right for you.  HOME CARE INSTRUCTIONS   Schedule regular health, dental, and eye exams.  Stay current with your immunizations.   Do not use any tobacco products including cigarettes, chewing tobacco, or electronic cigarettes.  If you are pregnant, do not drink alcohol.  If you are breastfeeding, limit how much and how often you drink alcohol.  Limit alcohol intake to no more than 1 drink per day for nonpregnant women. One drink equals 12 ounces of beer, 5 ounces of wine, or 1 ounces of hard liquor.  Do not use street drugs.  Do not share needles.  Ask your health care provider for help if you need support or information about quitting drugs.  Tell your health care provider if you often feel depressed.  Tell your health care provider if you have ever been abused or do not feel safe at home.   This information is  not intended to replace advice given to you by your health care provider. Make sure you discuss any questions you have with your health care provider.   Document Released: 01/07/2011 Document Revised: 07/15/2014 Document Reviewed: 05/26/2013 Elsevier Interactive Patient Education [employer].

## 2015-05-01 NOTE — Progress Notes (Signed)
Pre visit review using our clinic review tool, if applicable. No additional management support is needed unless otherwise documented below in the visit note.  Chief Complaint  Patient presents with  . Medicare Wellness    disease managemnet also     HPI: [name] 81 y.o. comes in today for Preventive Medicare wellness visit . Also has cm controleld renal insuff  Ht elevated lipids  And other issues  Concerns and meds  DM no lows numbness  Change in status needs new machine and strips not checking recently   LIPIDS  Low dose atrova   RIght side sore when sleeps a certain way but no cp sob falling  Limitations   Bleeding hemorrhoids evaluated but still happening at times   Blood ton tissues  What to try ?   Asks for  Temazepam 30 for sleep cause  Couple friends told her works and they sleep well.   She has neot slept well in  Years   Mind active ? If restless leg sx   No osa gets fidgety at times near bed   GERD trying to decrease nexium and taking qod  If stops more gets ferd sx   Health Maintenance  Topic Date Due  . DEXA SCAN  02/20/1997  . FOOT EXAM  12/17/2014  . HEMOGLOBIN A1C  10/30/2015  . URINE MICROALBUMIN  12/27/2015  . INFLUENZA VACCINE  02/06/2016  . OPHTHALMOLOGY EXAM  03/15/2016  . TETANUS/TDAP  01/12/2023  . ZOSTAVAX  Completed  . PNA vac Low Risk Adult  Completed   Health Maintenance Review LIFESTYLE:  TAD no Sugar beverages:no Sleep:see above     MEDICARE DOCUMENT QUESTIONS  TO SCAN   Hearing: ok  With hearing aid   Vision:  No limitations at present . Last eye check UTD  Safety:  Has smoke detector and wears seat belts.  No firearms. No excess sun exposure. Sees dentist regularly.  Falls: n  Advance directive :  Reviewed  Has one.  Memory: Felt to be good  , no concern from her or her family.  Depression: No anhedonia unusual crying or depressive symptoms  Nutrition: Eats well balanced diet; adequate calcium and vitamin D. No  swallowing chewing problems.  Injury: no major injuries in the last six months.  Other healthcare providers:  Reviewed today .  Social:  Lives with spouse married.   Preventive parameters: up-to-date  Reviewed   ADLS:   There are no problems or need for assistance  driving, feeding, obtaining food, dressing, toileting and bathing, managing money using phone. She is independent.     ROS:  See hpi  GEN/ HEENT: No fever, significant weight changes sweats headaches vision problems hearing changes, CV/ PULM; No chest pain shortness of breath cough, syncope,edema  change in exercise tolerance. GI /GU: No adominal pain, vomiting, change in bowel habits. . No significant GU symptoms. SKIN/HEME: ,no acute skin rashes suspicious lesions or bleeding. No lymphadenopathy, nodules, masses.  NEURO/ PSYCH:  No neurologic signs such as weakness numbness. No depression anxiety. IMM/ Allergy: No unusual infections.  Allergy .   REST of 12 system review negative except as per HPI   Past Medical History  Diagnosis Date  . Arthritis   . Diabetes mellitus   . Colon polyps     adenomatous  . Diverticulosis   . Hemorrhoids   . Dyslipidemia   . GERD (gastroesophageal reflux disease)   . Renal insufficiency   . DDD (degenerative disc disease)   .  Shingles     Family History  Problem Relation Age of Onset  . Colon cancer Neg Hx   . Stomach cancer Neg Hx   . Coronary artery disease Brother     MI in his 25s  . Pancreatic cancer Mother   . Prostate cancer Brother   . Diabetes Mother   . Diabetes Father   . Diabetes Brother   . Diabetes Sister   . Diabetes Daughter   . Diabetes Son   . Heart attack Brother   . Heart disease Father     Social History   Social History  . Marital Status: Married    Spouse Name: N/A  . Number of Children: 2  . Years of Education: N/A   Occupational History  . Retired    Social History Main Topics  . Smoking status: Never Smoker   . Smokeless  tobacco: Never Used  . Alcohol Use: No  . Drug Use: No  . Sexual Activity: No   Other Topics Concern  . None   Social History Narrative   HH 2 married   Works for [employer] for 40 years retired   No pets     Outpatient Encounter Prescriptions as of 05/01/2015  Medication Sig  . aspirin 81 MG chewable tablet Chew 81 mg by mouth daily.    Marland Kitchen atorvastatin (LIPITOR) 10 MG tablet Take 1 tablet (10 mg total) by mouth daily.  . Blood Glucose Calibration (OT ULTRA/FASTTK CNTRL SOLN) SOLN Use as instructed  . Cholecalciferol (VITAMIN D3) 2000 UNITS TABS Take by mouth. Taking 3 times weekly  . Cyanocobalamin (VITAMIN B-12 PO) Take 1 tablet by mouth daily.  . diazepam (VALIUM) 2 MG tablet Take one as needed for vertigo can repeat x 1 if needed maximum 3 per day.  . esomeprazole (NEXIUM) 40 MG capsule TAKE 1 CAPSULE DAILY BEFOREBREAKFAST  . furosemide (LASIX) 40 MG tablet TAKE 1 TABLET DAILY FOR    EDEMA  . glipiZIDE (GLUCOTROL XL) 5 MG 24 hr tablet Take 1 tablet (5 mg total) by mouth daily.  Marland Kitchen glucose blood (ONE TOUCH ULTRA TEST) test strip Use as instructed;  . Lancets (ONETOUCH ULTRASOFT) lancets Use as instructed  . [DISCONTINUED] atorvastatin (LIPITOR) 10 MG tablet Take 1 tablet (10 mg total) by mouth daily.  . [DISCONTINUED] esomeprazole (NEXIUM) 40 MG capsule TAKE 1 CAPSULE DAILY BEFOREBREAKFAST  . [DISCONTINUED] furosemide (LASIX) 40 MG tablet TAKE 1 TABLET DAILY FOR    EDEMA  . [DISCONTINUED] glipiZIDE (GLUCOTROL XL) 5 MG 24 hr tablet Take 1 tablet (5 mg total) by mouth daily.  Marland Kitchen gabapentin (NEURONTIN) 300 MG capsule Take 1 capsule (300 mg total) by mouth at bedtime.  Marland Kitchen glucose blood (ONETOUCH VERIO) test strip Use to test blood sugar twice daily  . ONE TOUCH LANCETS MISC Use to test blood sugar twice daily  . [DISCONTINUED] Calcium Carbonate-Vitamin D (CALCIUM 500 + D PO) Take by mouth 2 (two) times daily.   . [DISCONTINUED] cephALEXin (KEFLEX) 500 MG capsule Take 1 capsule (500 mg  total) by mouth 3 (three) times daily.  . [DISCONTINUED] cephALEXin (KEFLEX) 500 MG capsule Take 1 capsule (500 mg total) by mouth 2 (two) times daily.  . [DISCONTINUED] furosemide (LASIX) 40 MG tablet Take 20 mg by mouth every other day. For edema   No facility-administered encounter medications on file as of 05/01/2015.    EXAM:  BP 130/66 mmHg  Temp(Src) 97.8 F (36.6 C) (Oral)  Ht 5' 0.75" (1.543 m)  Wt 158 lb 8 oz (71.895 kg)  BMI 30.20 kg/m2  Body mass index is 30.2 kg/(m^2).  Physical Exam: Vital signs reviewed HVL:UVPQ is a well-developed well-nourished alert cooperative   who appears stated age in no acute distress.  HEENT: normocephalic atraumatic , Eyes: PERRL EOM's full, conjunctiva clear, Nares: paten,t no deformity discharge or tenderness., Ears: no deformity EAC's clear TMs with normal landmarks. Mouth: clear OP, no lesions, edema.  Moist mucous membranes. Dentition in adequate repair. NECK: supple without masses, thyromegaly or bruits. CHEST/PULM:  Clear to auscultation and percussion breath sounds equal no wheeze , rales or rhonchi. No chest wall deformities or tenderness.Breast: normal by inspection . No dimpling, discharge, masses, tenderness or discharge . CV: PMI is nondisplaced, S1 S2 no gallops, murmurs, rubs. Peripheral pulses are full without delay.No JVD .  ABDOMEN: Bowel sounds normal nontender  No guard or rebound, no hepato splenomegal no CVA tenderness.   Extremtities:  No clubbing cyanosis or edema, no acute joint swelling or redness no focal atrophy NEURO:  Oriented x3, cranial nerves 3-12 appear to be intact, no obvious focal weakness,gait within normal limits no abnormal reflexes or asymmetrical SKIN: No acute rashes normal turgor, color, no bruising or petechiae. PSYCH: Oriented, good eye contact, no obvious depression anxiety, cognition and judgment appear normal. LN: no cervical axillary inguinal adenopathy No noted deficits in memory, attention,  and speech. Diabetic Foot Exam - Simple   Simple Foot Form  Diabetic Foot exam was performed with the following findings:  Yes 05/01/2015 11:11 AM  Visual Inspection  No deformities, no ulcerations, no other skin breakdown bilaterally:  Yes  Sensation Testing  Intact to touch and monofilament testing bilaterally:  Yes  Pulse Check  Posterior Tibialis and Dorsalis pulse intact bilaterally:  Yes  Comments       Lab Results  Component Value Date   WBC 4.0 05/01/2015   HGB 12.9 05/01/2015   HCT 38.9 05/01/2015   PLT 223.0 05/01/2015   GLUCOSE 157* 05/01/2015   CHOL 170 05/01/2015   TRIG 242.0* 05/01/2015   HDL 36.20* 05/01/2015   LDLDIRECT 85.0 05/01/2015   LDLCALC 83 12/16/2013   ALT 13 05/01/2015   AST 13 05/01/2015   NA 142 05/01/2015   K 4.4 05/01/2015   CL 104 05/01/2015   CREATININE 1.25* 05/01/2015   BUN 26* 05/01/2015   CO2 30 05/01/2015   TSH 2.06 05/01/2015   INR 1.0 02/15/2009   HGBA1C 7.4* 05/01/2015   MICROALBUR <0.2 12/27/2014    ASSESSMENT AND PLAN:  Discussed the following assessment and plan:  Medicare annual wellness visit, subsequent  Type 2 diabetes mellitus without complication, without long-term current use of insulin (HCC) - Plan: Basic metabolic panel, CBC with Differential/Platelet, Hemoglobin A1c, Hepatic function panel, Lipid panel, TSH, T4, free, POCT urinalysis dipstick, Microalbumin / creatinine urine ratio  Renal insufficiency - Plan: Basic metabolic panel, CBC with Differential/Platelet, Hemoglobin A1c, Hepatic function panel, Lipid panel, TSH, T4, free  Wears hearing aid  Osteoarthritis, unspecified osteoarthritis type, unspecified site - Plan: Basic metabolic panel, CBC with Differential/Platelet, Hemoglobin A1c, Hepatic function panel, Lipid panel, TSH, T4, free  Medication management - Plan: Basic metabolic panel, CBC with Differential/Platelet, Hemoglobin A1c, Hepatic function panel, Lipid panel, TSH, T4, free  Hyperlipidemia -  Plan: Basic metabolic panel, CBC with Differential/Platelet, Hemoglobin A1c, Hepatic function panel, Lipid panel, TSH, T4, free  Need for prophylactic vaccination and inoculation against influenza - Plan: Flu vaccine HIGH DOSE PF (Fluzone High dose)  Insomnia - Plan: Basic metabolic panel, CBC with Differential/Platelet, Hemoglobin A1c, Hepatic function panel, Lipid panel, TSH, T4, free  Gastroesophageal reflux disease, esophagitis presence not specified - Plan: Basic metabolic panel, CBC with Differential/Platelet, Hemoglobin A1c, Hepatic function panel, Lipid panel, TSH, T4, free  disc about sleep problematic and  temaz is high risk med and more risk than benefit and i dont usually initiate this rx  For her situation.  Disc sleep  ? If has  rls and could try gabapentin although that also   has some risk  agreew with tyring to wean the ppi  Dm machine given  Get strips  Patient Care Team: Burnis Medin, MD as PCP - General (Internal Medicine) Monna Fam, MD (Ophthalmology)  Patient Instructions   Will notify you  of labs when available. Will refill you meds  As discusse d temazepam is a high risk medication  For falls and memory fogginess .  We need to try something else to help with sleep    Because of   The restless ness you get lets try gabapentin at night  For a month or se and see if helps. Can cause drowsiness that resolves with time.   Try alternating  nexium with  Ranitidine orzantac or famotidine pepcid otc on days to wean .     Insomnia Insomnia is a sleep disorder that makes it difficult to fall asleep or to stay asleep. Insomnia can cause tiredness (fatigue), low energy, difficulty concentrating, mood swings, and poor performance at work or school.  There are three different ways to classify insomnia:  Difficulty falling asleep.  Difficulty staying asleep.  Waking up too early in the morning. Any type of insomnia can be long-term (chronic) or short-term (acute).  Both are common. Short-term insomnia usually lasts for three months or less. Chronic insomnia occurs at least three times a week for longer than three months. CAUSES  Insomnia may be caused by another condition, situation, or substance, such as:  Anxiety.  Certain medicines.  Gastroesophageal reflux disease (GERD) or other gastrointestinal conditions.  Asthma or other breathing conditions.  Restless legs syndrome, sleep apnea, or other sleep disorders.  Chronic pain.  Menopause. This may include hot flashes.  Stroke.  Abuse of alcohol, tobacco, or illegal drugs.  Depression.  Caffeine.   Neurological disorders, such as Alzheimer disease.  An overactive thyroid (hyperthyroidism). The cause of insomnia may not be known. RISK FACTORS Risk factors for insomnia include:  Gender. Women are more commonly affected than men.  Age. Insomnia is more common as you get older.  Stress. This may involve your professional or personal life.  Income. Insomnia is more common in people with lower income.  Lack of exercise.   Irregular work schedule or night shifts.  Traveling between different time zones. SIGNS AND SYMPTOMS If you have insomnia, trouble falling asleep or trouble staying asleep is the main symptom. This may lead to other symptoms, such as:  Feeling fatigued.  Feeling nervous about going to sleep.  Not feeling rested in the morning.  Having trouble concentrating.  Feeling irritable, anxious, or depressed. TREATMENT  Treatment for insomnia depends on the cause. If your insomnia is caused by an underlying condition, treatment will focus on addressing the condition. Treatment may also include:   Medicines to help you sleep.  Counseling or therapy.  Lifestyle adjustments. HOME CARE INSTRUCTIONS   Take medicines only as directed by your health care provider.  Keep regular sleeping and waking hours. Avoid  naps.  Keep a sleep diary to help you and your  health care provider figure out what could be causing your insomnia. Include:   When you sleep.  When you wake up during the night.  How well you sleep.   How rested you feel the next day.  Any side effects of medicines you are taking.  What you eat and drink.   Make your bedroom a comfortable place where it is easy to fall asleep:  Put up shades or special blackout curtains to block light from outside.  Use a white noise machine to block noise.  Keep the temperature cool.   Exercise regularly as directed by your health care provider. Avoid exercising right before bedtime.  Use relaxation techniques to manage stress. Ask your health care provider to suggest some techniques that may work well for you. These may include:  Breathing exercises.  Routines to release muscle tension.  Visualizing peaceful scenes.  Cut back on alcohol, caffeinated beverages, and cigarettes, especially close to bedtime. These can disrupt your sleep.  Do not overeat or eat spicy foods right before bedtime. This can lead to digestive discomfort that can make it hard for you to sleep.  Limit screen use before bedtime. This includes:  Watching TV.  Using your smartphone, tablet, and computer.  Stick to a routine. This can help you fall asleep faster. Try to do a quiet activity, brush your teeth, and go to bed at the same time each night.  Get out of bed if you are still awake after 15 minutes of trying to sleep. Keep the lights down, but try reading or doing a quiet activity. When you feel sleepy, go back to bed.  Make sure that you drive carefully. Avoid driving if you feel very sleepy.  Keep all follow-up appointments as directed by your health care provider. This is important. SEEK MEDICAL CARE IF:   You are tired throughout the day or have trouble in your daily routine due to sleepiness.  You continue to have sleep problems or your sleep problems get worse. SEEK IMMEDIATE MEDICAL CARE  IF:   You have serious thoughts about hurting yourself or someone else.   This information is not intended to replace advice given to you by your health care provider. Make sure you discuss any questions you have with your health care provider.   Document Released: 06/21/2000 Document Revised: 03/15/2015 Document Reviewed: 03/25/2014 Elsevier Interactive Patient Education 2016 [place] Maintenance, Female Adopting a healthy lifestyle and getting preventive care can go a long way to promote health and wellness. Talk with your health care provider about what schedule of regular examinations is right for you. This is a good chance for you to check in with your provider about disease prevention and staying healthy. In between checkups, there are plenty of things you can do on your own. Experts have done a lot of research about which lifestyle changes and preventive measures are most likely to keep you healthy. Ask your health care provider for more information. WEIGHT AND DIET  Eat a healthy diet  Be sure to include plenty of vegetables, fruits, low-fat dairy products, and lean protein.  Do not eat a lot of foods high in solid fats, added sugars, or salt.  Get regular exercise. This is one of the most important things you can do for your health.  Most adults should exercise for at least 150 minutes each week. The exercise should increase your heart rate and  make you sweat (moderate-intensity exercise).  Most adults should also do strengthening exercises at least twice a week. This is in addition to the moderate-intensity exercise.  Maintain a healthy weight  Body mass index (BMI) is a measurement that can be used to identify possible weight problems. It estimates body fat based on height and weight. Your health care provider can help determine your BMI and help you achieve or maintain a healthy weight.  For females 82 years of age and older:   A BMI below 18.5 is considered  underweight.  A BMI of 18.5 to 24.9 is normal.  A BMI of 25 to 29.9 is considered overweight.  A BMI of 30 and above is considered obese.  Watch levels of cholesterol and blood lipids  You should start having your blood tested for lipids and cholesterol at 79 years of age, then have this test every 5 years.  You may need to have your cholesterol levels checked more often if:  Your lipid or cholesterol levels are high.  You are older than 79 years of age.  You are at high risk for heart disease.  CANCER SCREENING   Lung Cancer  Lung cancer screening is recommended for adults 19-6 years old who are at high risk for lung cancer because of a history of smoking.  A yearly low-dose CT scan of the lungs is recommended for people who:  Currently smoke.  Have quit within the past 15 years.  Have at least a 30-pack-year history of smoking. A pack year is smoking an average of one pack of cigarettes a day for 1 year.  Yearly screening should continue until it has been 15 years since you quit.  Yearly screening should stop if you develop a health problem that would prevent you from having lung cancer treatment.  Breast Cancer  Practice breast self-awareness. This means understanding how your breasts normally appear and feel.  It also means doing regular breast self-exams. Let your health care provider know about any changes, no matter how small.  If you are in your 20s or 30s, you should have a clinical breast exam (CBE) by a health care provider every 1-3 years as part of a regular health exam.  If you are 49 or older, have a CBE every year. Also consider having a breast X-ray (mammogram) every year.  If you have a family history of breast cancer, talk to your health care provider about genetic screening.  If you are at high risk for breast cancer, talk to your health care provider about having an MRI and a mammogram every year.  Breast cancer gene (BRCA) assessment is  recommended for women who have family members with BRCA-related cancers. BRCA-related cancers include:  Breast.  Ovarian.  Tubal.  Peritoneal cancers.  Results of the assessment will determine the need for genetic counseling and BRCA1 and BRCA2 testing. Cervical Cancer Your health care provider may recommend that you be screened regularly for cancer of the pelvic organs (ovaries, uterus, and vagina). This screening involves a pelvic examination, including checking for microscopic changes to the surface of your cervix (Pap test). You may be encouraged to have this screening done every 3 years, beginning at age 42.  For women ages 25-65, health care providers may recommend pelvic exams and Pap testing every 3 years, or they may recommend the Pap and pelvic exam, combined with testing for human papilloma virus (HPV), every 5 years. Some types of HPV increase your risk of cervical cancer. Testing  for HPV may also be done on women of any age with unclear Pap test results.  Other health care providers may not recommend any screening for nonpregnant women who are considered low risk for pelvic cancer and who do not have symptoms. Ask your health care provider if a screening pelvic exam is right for you.  If you have had past treatment for cervical cancer or a condition that could lead to cancer, you need Pap tests and screening for cancer for at least 20 years after your treatment. If Pap tests have been discontinued, your risk factors (such as having a new sexual partner) need to be reassessed to determine if screening should resume. Some women have medical problems that increase the chance of getting cervical cancer. In these cases, your health care provider may recommend more frequent screening and Pap tests. Colorectal Cancer  This type of cancer can be detected and often prevented.  Routine colorectal cancer screening usually begins at 79 years of age and continues through 79 years of  age.  Your health care provider may recommend screening at an earlier age if you have risk factors for colon cancer.  Your health care provider may also recommend using home test kits to check for hidden blood in the stool.  A small camera at the end of a tube can be used to examine your colon directly (sigmoidoscopy or colonoscopy). This is done to check for the earliest forms of colorectal cancer.  Routine screening usually begins at age 33.  Direct examination of the colon should be repeated every 5-10 years through 79 years of age. However, you may need to be screened more often if early forms of precancerous polyps or small growths are found. Skin Cancer  Check your skin from head to toe regularly.  Tell your health care provider about any new moles or changes in moles, especially if there is a change in a mole's shape or color.  Also tell your health care provider if you have a mole that is larger than the size of a pencil eraser.  Always use sunscreen. Apply sunscreen liberally and repeatedly throughout the day.  Protect yourself by wearing long sleeves, pants, a wide-brimmed hat, and sunglasses whenever you are outside. HEART DISEASE, DIABETES, AND HIGH BLOOD PRESSURE   High blood pressure causes heart disease and increases the risk of stroke. High blood pressure is more likely to develop in:  People who have blood pressure in the high end of the normal range (130-139/85-89 mm Hg).  People who are overweight or obese.  People who are African American.  If you are 64-47 years of age, have your blood pressure checked every 3-5 years. If you are 5 years of age or older, have your blood pressure checked every year. You should have your blood pressure measured twice--once when you are at a hospital or clinic, and once when you are not at a hospital or clinic. Record the average of the two measurements. To check your blood pressure when you are not at a hospital or clinic, you can  use:  An automated blood pressure machine at a pharmacy.  A home blood pressure monitor.  If you are between 44 years and 58 years old, ask your health care provider if you should take aspirin to prevent strokes.  Have regular diabetes screenings. This involves taking a blood sample to check your fasting blood sugar level.  If you are at a normal weight and have a low risk for diabetes,  have this test once every three years after 78 years of age.  If you are overweight and have a high risk for diabetes, consider being tested at a younger age or more often. PREVENTING INFECTION  Hepatitis B  If you have a higher risk for hepatitis B, you should be screened for this virus. You are considered at high risk for hepatitis B if:  You were born in a country where hepatitis B is common. Ask your health care provider which countries are considered high risk.  Your parents were born in a high-risk country, and you have not been immunized against hepatitis B (hepatitis B vaccine).  You have HIV or AIDS.  You use needles to inject street drugs.  You live with someone who has hepatitis B.  You have had sex with someone who has hepatitis B.  You get hemodialysis treatment.  You take certain medicines for conditions, including cancer, organ transplantation, and autoimmune conditions. Hepatitis C  Blood testing is recommended for:  Everyone born from [birth year] through 1963.  Anyone with known risk factors for hepatitis C. Sexually transmitted infections (STIs)  You should be screened for sexually transmitted infections (STIs) including gonorrhea and chlamydia if:  You are sexually active and are younger than 79 years of age.  You are older than 79 years of age and your health care provider tells you that you are at risk for this type of infection.  Your sexual activity has changed since you were last screened and you are at an increased risk for chlamydia or gonorrhea. Ask your health care  provider if you are at risk.  If you do not have HIV, but are at risk, it may be recommended that you take a prescription medicine daily to prevent HIV infection. This is called pre-exposure prophylaxis (PrEP). You are considered at risk if:  You are sexually active and do not regularly use condoms or know the HIV status of your partner(s).  You take drugs by injection.  You are sexually active with a partner who has HIV. Talk with your health care provider about whether you are at high risk of being infected with HIV. If you choose to begin PrEP, you should first be tested for HIV. You should then be tested every 3 months for as long as you are taking PrEP.  PREGNANCY   If you are premenopausal and you may become pregnant, ask your health care provider about preconception counseling.  If you may become pregnant, take 400 to 800 micrograms (mcg) of folic acid every day.  If you want to prevent pregnancy, talk to your health care provider about birth control (contraception). OSTEOPOROSIS AND MENOPAUSE   Osteoporosis is a disease in which the bones lose minerals and strength with aging. This can result in serious bone fractures. Your risk for osteoporosis can be identified using a bone density scan.  If you are 32 years of age or older, or if you are at risk for osteoporosis and fractures, ask your health care provider if you should be screened.  Ask your health care provider whether you should take a calcium or vitamin D supplement to lower your risk for osteoporosis.  Menopause may have certain physical symptoms and risks.  Hormone replacement therapy may reduce some of these symptoms and risks. Talk to your health care provider about whether hormone replacement therapy is right for you.  HOME CARE INSTRUCTIONS   Schedule regular health, dental, and eye exams.  Stay current with your immunizations.  Do not use any tobacco products including cigarettes, chewing tobacco, or  electronic cigarettes.  If you are pregnant, do not drink alcohol.  If you are breastfeeding, limit how much and how often you drink alcohol.  Limit alcohol intake to no more than 1 drink per day for nonpregnant women. One drink equals 12 ounces of beer, 5 ounces of wine, or 1 ounces of hard liquor.  Do not use street drugs.  Do not share needles.  Ask your health care provider for help if you need support or information about quitting drugs.  Tell your health care provider if you often feel depressed.  Tell your health care provider if you have ever been abused or do not feel safe at home.   This information is not intended to replace advice given to you by your health care provider. Make sure you discuss any questions you have with your health care provider.   Document Released: 01/07/2011 Document Revised: 07/15/2014 Document Reviewed: 05/26/2013 Elsevier Interactive Patient Education 2016 [place] K. Brilynn Biasi M.D.

## 2015-05-06 DIAGNOSIS — G4700 Insomnia, unspecified: Secondary | ICD-10-CM | POA: Insufficient documentation

## 2015-05-16 ENCOUNTER — Other Ambulatory Visit: Payer: Self-pay | Admitting: Internal Medicine

## 2015-05-16 DIAGNOSIS — R8290 Unspecified abnormal findings in urine: Secondary | ICD-10-CM

## 2015-05-23 ENCOUNTER — Other Ambulatory Visit (INDEPENDENT_AMBULATORY_CARE_PROVIDER_SITE_OTHER): Payer: Medicare Other

## 2015-05-23 DIAGNOSIS — R8290 Unspecified abnormal findings in urine: Secondary | ICD-10-CM

## 2015-05-23 LAB — POCT URINALYSIS DIPSTICK
Bilirubin, UA: NEGATIVE
Blood, UA: NEGATIVE
Glucose, UA: NEGATIVE
Ketones, UA: NEGATIVE
Nitrite, UA: NEGATIVE
Protein, UA: NEGATIVE
Spec Grav, UA: 1.02
Urobilinogen, UA: 0.2
pH, UA: 5.5

## 2015-06-27 DIAGNOSIS — Z803 Family history of malignant neoplasm of breast: Secondary | ICD-10-CM | POA: Diagnosis not present

## 2015-06-27 DIAGNOSIS — Z1231 Encounter for screening mammogram for malignant neoplasm of breast: Secondary | ICD-10-CM | POA: Diagnosis not present

## 2015-06-27 LAB — HM MAMMOGRAPHY

## 2015-06-28 ENCOUNTER — Encounter: Payer: Self-pay | Admitting: Family Medicine

## 2015-07-06 DIAGNOSIS — N63 Unspecified lump in breast: Secondary | ICD-10-CM | POA: Diagnosis not present

## 2015-07-06 DIAGNOSIS — Z1231 Encounter for screening mammogram for malignant neoplasm of breast: Secondary | ICD-10-CM | POA: Diagnosis not present

## 2015-07-06 DIAGNOSIS — Z803 Family history of malignant neoplasm of breast: Secondary | ICD-10-CM | POA: Diagnosis not present

## 2015-07-19 ENCOUNTER — Other Ambulatory Visit: Payer: Self-pay | Admitting: Radiology

## 2015-07-19 DIAGNOSIS — C50911 Malignant neoplasm of unspecified site of right female breast: Secondary | ICD-10-CM | POA: Diagnosis not present

## 2015-07-19 DIAGNOSIS — N63 Unspecified lump in breast: Secondary | ICD-10-CM | POA: Diagnosis not present

## 2015-07-19 DIAGNOSIS — Z1231 Encounter for screening mammogram for malignant neoplasm of breast: Secondary | ICD-10-CM | POA: Diagnosis not present

## 2015-07-19 DIAGNOSIS — Z803 Family history of malignant neoplasm of breast: Secondary | ICD-10-CM | POA: Diagnosis not present

## 2015-07-21 ENCOUNTER — Encounter: Payer: Self-pay | Admitting: *Deleted

## 2015-07-21 ENCOUNTER — Telehealth: Payer: Self-pay | Admitting: *Deleted

## 2015-07-21 DIAGNOSIS — C50411 Malignant neoplasm of upper-outer quadrant of right female breast: Secondary | ICD-10-CM

## 2015-07-21 HISTORY — DX: Malignant neoplasm of upper-outer quadrant of right female breast: C50.411

## 2015-07-21 NOTE — Telephone Encounter (Signed)
Confirmed [facility] for 07/26/15 at 1230 .  Instructions and contact information given.

## 2015-07-25 ENCOUNTER — Encounter: Payer: Self-pay | Admitting: Internal Medicine

## 2015-07-26 ENCOUNTER — Ambulatory Visit (HOSPITAL_BASED_OUTPATIENT_CLINIC_OR_DEPARTMENT_OTHER): Payer: Medicare Other | Admitting: Hematology and Oncology

## 2015-07-26 ENCOUNTER — Encounter: Payer: Self-pay | Admitting: Nurse Practitioner

## 2015-07-26 ENCOUNTER — Other Ambulatory Visit (HOSPITAL_BASED_OUTPATIENT_CLINIC_OR_DEPARTMENT_OTHER): Payer: Medicare Other

## 2015-07-26 ENCOUNTER — Ambulatory Visit
Admission: RE | Admit: 2015-07-26 | Discharge: 2015-07-26 | Disposition: A | Discharge status: Home / Self Care | Payer: Medicare Other | Source: Ambulatory Visit | Attending: Radiation Oncology | Admitting: Radiation Oncology

## 2015-07-26 ENCOUNTER — Ambulatory Visit: Payer: Medicare Other | Admitting: Physical Therapy

## 2015-07-26 ENCOUNTER — Other Ambulatory Visit: Payer: Self-pay | Admitting: General Surgery

## 2015-07-26 ENCOUNTER — Encounter: Payer: Self-pay | Admitting: Hematology and Oncology

## 2015-07-26 VITALS — BP 140/54 | HR 89 | Temp 98.0°F | Resp 18 | Ht 60.75 in | Wt 158.3 lb

## 2015-07-26 DIAGNOSIS — C50411 Malignant neoplasm of upper-outer quadrant of right female breast: Secondary | ICD-10-CM

## 2015-07-26 DIAGNOSIS — Z170 Estrogen receptor positive status [ER+]: Secondary | ICD-10-CM

## 2015-07-26 DIAGNOSIS — C50911 Malignant neoplasm of unspecified site of right female breast: Secondary | ICD-10-CM | POA: Diagnosis not present

## 2015-07-26 LAB — COMPREHENSIVE METABOLIC PANEL
ALT: 13 U/L (ref 0–55)
AST: 14 U/L (ref 5–34)
Albumin: 4 g/dL (ref 3.5–5.0)
Alkaline Phosphatase: 90 U/L (ref 40–150)
Anion Gap: 10 mEq/L (ref 3–11)
BUN: 32.3 mg/dL — ABNORMAL HIGH (ref 7.0–26.0)
CO2: 29 mEq/L (ref 22–29)
Calcium: 9.8 mg/dL (ref 8.4–10.4)
Chloride: 102 mEq/L (ref 98–109)
Creatinine: 1.5 mg/dL — ABNORMAL HIGH (ref 0.6–1.1)
EGFR: 32 mL/min/{1.73_m2} — ABNORMAL LOW (ref >90)
Glucose: 140 mg/dl (ref 70–140)
Potassium: 4.1 mEq/L (ref 3.5–5.1)
Sodium: 141 mEq/L (ref 136–145)
Total Bilirubin: 0.55 mg/dL (ref 0.20–1.20)
Total Protein: 7.4 g/dL (ref 6.4–8.3)

## 2015-07-26 LAB — CBC WITH DIFFERENTIAL/PLATELET
BASO%: 1.4 % (ref 0.0–2.0)
Basophils Absolute: 0.1 10*3/uL (ref 0.0–0.1)
EOS%: 3.9 % (ref 0.0–7.0)
Eosinophils Absolute: 0.2 10*3/uL (ref 0.0–0.5)
HCT: 37.8 % (ref 34.8–46.6)
HGB: 12.5 g/dL (ref 11.6–15.9)
LYMPH%: 37.5 % (ref 14.0–49.7)
MCH: 29.4 pg (ref 25.1–34.0)
MCHC: 33.2 g/dL (ref 31.5–36.0)
MCV: 88.4 fL (ref 79.5–101.0)
MONO#: 0.5 10*3/uL (ref 0.1–0.9)
MONO%: 9.8 % (ref 0.0–14.0)
NEUT#: 2.4 10*3/uL (ref 1.5–6.5)
NEUT%: 47.4 % (ref 38.4–76.8)
Platelets: 230 10*3/uL (ref 145–400)
RBC: 4.27 10*6/uL (ref 3.70–5.45)
RDW: 13.9 % (ref 11.2–14.5)
WBC: 5 10*3/uL (ref 3.9–10.3)
lymph#: 1.9 10*3/uL (ref 0.9–3.3)

## 2015-07-26 NOTE — Assessment & Plan Note (Signed)
Right breast needle biopsy 10:00 07/19/2015: Invasive ductal carcinoma grade 1, ER 90%, PR 100%, HER-2 negative ratio 1.3, Ki-67 5%, 0.7 cm right breast distortion , T1 BN 0 stage I a clinical stage  Pathology and radiology review:Discussed with the patient, the details of pathology including the type of breast cancer,the clinical staging, the significance of ER, PR and HER-2/neu receptors and the implications for treatment. After reviewing the pathology in detail, we proceeded to discuss the different treatment options between surgery, and radiation,   Recommendation: 1. Breast conserving surgery ( no need to do an axillary staging) 2. Followed by adjuvant radiation therapy  Patient does not need chemotherapy or antiestrogen therapy because she is ER/PR negative. Return to clinic after surgery to discuss final pathology report. It is quite probable that she might follow with Dr. Donne Hazel for surveillance of breast cancer down the line and may not need to see [place] in the medical oncology setting.

## 2015-07-26 NOTE — Progress Notes (Signed)
[name] is a very pleasant 82 y.o. female from [place], [place] with newly diagnosed invasive ductal carcinoma of the right breast.  Biopsy results revealed the tumor's prognostic profile is ER negative, PR negative, and HER2/neu negative.  She presents today with her husband and daughter to the [facility]) for treatment consideration and recommendations from the breast surgeon, radiation oncologist, and medical oncologist.     I briefly met with [name] and her family during her [facility] visit today. We discussed the purpose of the Survivorship Clinic, which will include monitoring for recurrence, coordinating completion of age and gender-appropriate cancer screenings, promotion of overall wellness, as well as managing potential late/long-term side effects of anti-cancer treatments.    The treatment plan for [name] will likely include surgery and radiation therapy. She will meet with the Genetics Counselor due to her triple negative status . As of today, the intent of treatment for [name] is cure, therefore she will be eligible for the Survivorship Clinic upon her completion of treatment.  Her survivorship care plan (SCP) document will be drafted and updated throughout the course of her treatment trajectory. She will receive the SCP in an office visit with myself in the Survivorship Clinic once she has completed treatment.   [name] was encouraged to ask questions and all questions were answered to her satisfaction.  She was given my business card and encouraged to contact me with any concerns regarding survivorship.  I look forward to participating in her care.   Kenn File, [place] [phone]

## 2015-07-26 NOTE — Progress Notes (Signed)
Radiation Oncology         [phone] ________________________________  Initial Outpatient Consultation  Name: [name] MRN: [id number]  Date: 07/26/2015  DOB: 1930/05/17  KC:LEXNTZ,GYFVC KOTVAN, MD  [name], MD   REFERRING PHYSICIAN: [name], MD  DIAGNOSIS:    ICD-9-CM ICD-10-CM   1. Breast cancer of upper-outer quadrant of right female breast ([place]) 174.4 C50.411     T1a invasive ductal carcinoma of the right breast, ER 0%, PR 0%, Her2-neu negative, and KI67 20%.  HISTORY OF PRESENT ILLNESS::[name] is a 82 y.o. female who presents to the clinic with an abnormal mammogram. She had a biopsy on 07/19/2015 revealing a 5 mm mass showing right invasive ductal carcinoma that is ER 0%, PR 0%, Her2-neu negative, and Ki67 20%. She was not having pain in her breast until the ultrasound. After that, she has had some pain. She denies nipple discharge. She mentions that one of her nipples has been itching for a couple years.  She is accompanied by her husband and daughter.   Breast cancer of upper-outer quadrant of right female breast ([place])   07/19/2015 Initial Diagnosis Right breast biopsy: Invasive ductal carcinoma grade 2-3, PR 0%, PR 0%, Ki-67 20%, HER-2 negative ratio 1.77; 5 mm abnormality at 10:00 position, T1 BN 0 stage I a clinical stage   PREVIOUS RADIATION THERAPY: No  PAST MEDICAL HISTORY:  has a past medical history of Arthritis; Diabetes mellitus; Colon polyps; Diverticulosis; Hemorrhoids; Dyslipidemia; GERD (gastroesophageal reflux disease); Renal insufficiency; DDD (degenerative disc disease); Shingles; and Breast cancer of upper-outer quadrant of right female breast ([place]) (07/21/2015).    PAST SURGICAL HISTORY: Past Surgical History  Procedure Laterality Date  . Total shoulder arthroplasty Right 2010  . Total knee arthroplasty Right 2003  . Total knee arthroplasty Left 2010  . Umbilical hernia repair  1958  . Vaginal hysterectomy  1975    . Carpal tunnel release Right 1977  . Cataract extraction w/ intraocular lens  implant, bilateral Bilateral     left 1996; right 1997  . Ganglion cyst excision Right 2003    hand    FAMILY HISTORY: family history includes Coronary artery disease in her brother; Diabetes in her brother, daughter, father, mother, sister, and son; Heart attack in her brother; Heart disease in her father; Pancreatic cancer in her mother; Prostate cancer in her brother. There is no history of Colon cancer or Stomach cancer.   In terms of breast cancer risk profile:  She menarched at early age of 27 and went to menopause at age 67  She had 2 pregnancy, her first child was born at age 12  She has received birth control pills for approximately 10 years.  She was never exposed to fertility medications or hormone replacement therapy.  She has family history of Breast/GYN/GI cancer Patient's niece had breast cancer age 50 and 69, mother had pancreatic cancer and her brother had prostate cancer  SOCIAL HISTORY:  reports that she has never smoked. She has never used smokeless tobacco. She reports that she does not drink alcohol or use illicit drugs.  ALLERGIES: Review of patient's allergies indicates no known allergies.  MEDICATIONS:  Current Outpatient Prescriptions  Medication Sig Dispense Refill  . aspirin 81 MG chewable tablet Chew 81 mg by mouth daily.      Marland Kitchen atorvastatin (LIPITOR) 10 MG tablet Take 1 tablet (10 mg total) by mouth daily. 90 tablet 3  . Blood Glucose Calibration (OT ULTRA/FASTTK CNTRL SOLN) SOLN  Use as instructed 1 each 11  . Cholecalciferol (VITAMIN D3) 2000 UNITS TABS Take by mouth. Taking 3 times weekly    . Cyanocobalamin (VITAMIN B-12 PO) Take 1 tablet by mouth daily.    . diazepam (VALIUM) 2 MG tablet Take one as needed for vertigo can repeat x 1 if needed maximum 3 per day. 30 tablet 0  . esomeprazole (NEXIUM) 40 MG capsule TAKE 1 CAPSULE DAILY BEFOREBREAKFAST 90 capsule 3  .  furosemide (LASIX) 40 MG tablet TAKE 1 TABLET DAILY FOR    EDEMA 90 tablet 3  . gabapentin (NEURONTIN) 300 MG capsule Take 1 capsule (300 mg total) by mouth at bedtime. 30 capsule 3  . glipiZIDE (GLUCOTROL XL) 5 MG 24 hr tablet Take 1 tablet (5 mg total) by mouth daily. 90 tablet 3  . glucose blood (ONE TOUCH ULTRA TEST) test strip Use as instructed; 300 each 5  . glucose blood (ONETOUCH VERIO) test strip Use to test blood sugar twice daily 300 each 3  . Lancets (ONETOUCH ULTRASOFT) lancets Use as instructed 300 each 5  . ONE TOUCH LANCETS MISC Use to test blood sugar twice daily 600 each 3   No current facility-administered medications for this encounter.    REVIEW OF SYSTEMS:  A 15 point review of systems is documented in the electronic medical record. This was obtained by the nursing staff. However, I reviewed this with the patient to discuss relevant findings and make appropriate changes.  Pertinent items are noted in HPI.   PHYSICAL EXAM:  Vitals - 1 value per visit 2/95/1884  SYSTOLIC 166  DIASTOLIC 54  Pulse 89  Temperature 98  Respirations 18  Weight (lb) 158.3  Height 5' .75"  BMI 30.16  VISIT REPORT    General: Alert and oriented, in no acute distress HEENT: Head is normocephalic.  Neck: Neck is supple, no palpable cervical or supraclavicular lymphadenopathy. Heart: Regular in rate and rhythm with no murmurs, rubs, or gallops. Chest: Clear to auscultation bilaterally, with no rhonchi, wheezes, or rales. Abdomen: Soft, nontender, nondistended, with no rigidity or guarding. Extremities: No cyanosis or edema. Lymphatics: see Neck Exam Skin: No concerning lesions. Musculoskeletal: symmetric strength and muscle tone throughout. Neurologic: . No obvious focalities. Speech is fluent. Coordination is intact. Psychiatric: Judgment and insight are intact. Affect is appropriate.  In the 8:30 position of her right breast she had some bruising from the biopsy. No palpable mass  nipple discharge or bleeding in either breast   ECOG = 0  LABORATORY DATA:  Lab Results  Component Value Date   WBC 5.0 07/26/2015   HGB 12.5 07/26/2015   HCT 37.8 07/26/2015   MCV 88.4 07/26/2015   PLT 230 07/26/2015   NEUTROABS 2.4 07/26/2015   Lab Results  Component Value Date   NA 141 07/26/2015   K 4.1 07/26/2015   CL 104 05/01/2015   CO2 29 07/26/2015   GLUCOSE 140 07/26/2015   CREATININE 1.5* 07/26/2015   CALCIUM 9.8 07/26/2015      RADIOGRAPHY: Imaging performed at [facility] and reviewed in multidisciplinary clinic and conference    IMPRESSION: Ms. Larsson is a 82 yo female with right invasive ductal carcinoma. She is a good candidate for a lumpectomy. I would recommend radiation after the lumpectomy particularly since she would not benefit from adjuvant hormonal therapy.  We discussed her pathology report.  PLAN: She will be scheduled for a lumpectomy. Following the lumpectomy, since hormone therapy is not an option, we  will start radiation treatment. We discussed the possible side effects of radiation. She will also be scheduled for an appointment with genetics.    ------------------------------------------------  Blair Promise, PhD, MD    This document serves as a record of services personally performed by Gery Pray, MD. It was created on his behalf by Lendon Collar, a trained medical scribe. The creation of this record is based on the scribe's personal observations and the provider's statements to them. This document has been checked and approved by the attending provider.

## 2015-07-26 NOTE — Progress Notes (Signed)
[facility] NOTE  Patient Care Team: Burnis Medin, MD as PCP - General (Internal Medicine) Monna Fam, MD (Ophthalmology)  CHIEF COMPLAINTS/PURPOSE OF CONSULTATION:  Newly diagnosed breast cancer  HISTORY OF PRESENTING ILLNESS:  [name] 82 y.o. female is here because of recent diagnosis of Right breast cancer. Patient had a screening mammogram that revealed a right breast mass that was 5 mm at 10:00 position. Biopsy performed under ultrasound revealed intermediate to high-grade invasive ductal carcinoma that was ER 0% PR 0% HER-2 negative with a Ki-67 of 20%. She was presented this morning in the multidisciplinary tumor board and she is here today at the [facility] clinic to discuss a treatment plan. She was today accompanied by her husband and her daughter.  I reviewed her records extensively and collaborated the history with the patient.  SUMMARY OF ONCOLOGIC HISTORY:   Breast cancer of upper-outer quadrant of right female breast ([place])   07/19/2015 Initial Diagnosis Right breast biopsy: Invasive ductal carcinoma grade 2-3, PR 0%, PR 0%, Ki-67 20%, HER-2 negative ratio 1.77; 5 mm abnormality at 10:00 position, T1 BN 0 stage I a clinical stage    In terms of breast cancer risk profile:  She menarched at early age of 31 and went to menopause at age 26  She had 2 pregnancy, her first child was born at age 27  She has received birth control pills for approximately 10 years.  She was never exposed to fertility medications or hormone replacement therapy.  She has  family history of Breast/GYN/GI cancer Patient's niece had breast cancer age 61 and 74, mother had pancreatic cancer and her brother had prostate cancer  MEDICAL HISTORY:  Past Medical History  Diagnosis Date  . Arthritis   . Diabetes mellitus   . Colon polyps     adenomatous  . Diverticulosis   . Hemorrhoids   . Dyslipidemia   . GERD (gastroesophageal reflux disease)   . Renal insufficiency    . DDD (degenerative disc disease)   . Shingles   . Breast cancer of upper-outer quadrant of right female breast ([place]) 07/21/2015  . Breast cancer [facility])     SURGICAL HISTORY: Past Surgical History  Procedure Laterality Date  . Total shoulder arthroplasty Right 2010  . Total knee arthroplasty Right 2003  . Total knee arthroplasty Left 2010  . Umbilical hernia repair  1958  . Vaginal hysterectomy  1975  . Carpal tunnel release Right 1977  . Cataract extraction w/ intraocular lens  implant, bilateral Bilateral     left 1996; right 1997  . Ganglion cyst excision Right 2003    hand    SOCIAL HISTORY: Social History   Social History  . Marital Status: Married    Spouse Name: N/A  . Number of Children: 2  . Years of Education: N/A   Occupational History  . Retired    Social History Main Topics  . Smoking status: Never Smoker   . Smokeless tobacco: Never Used  . Alcohol Use: No  . Drug Use: No  . Sexual Activity: No   Other Topics Concern  . Not on file   Social History Narrative   HH 2 married   Works for [employer] for 40 years retired   No pets     FAMILY HISTORY: Family History  Problem Relation Age of Onset  . Colon cancer Neg Hx   . Stomach cancer Neg Hx   . Coronary artery disease Brother  MI in his 1s  . Pancreatic cancer Mother   . Diabetes Mother   . Prostate cancer Brother   . Diabetes Father   . Heart disease Father   . Diabetes Brother   . Diabetes Sister   . Diabetes Daughter   . Diabetes Son   . Heart attack Brother     ALLERGIES:  has No Known Allergies.  MEDICATIONS:  Current Outpatient Prescriptions  Medication Sig Dispense Refill  . aspirin 81 MG chewable tablet Chew 81 mg by mouth daily.      Marland Kitchen atorvastatin (LIPITOR) 10 MG tablet Take 1 tablet (10 mg total) by mouth daily. 90 tablet 3  . Blood Glucose Calibration (OT ULTRA/FASTTK CNTRL SOLN) SOLN Use as instructed 1 each 11  . calcium citrate-vitamin D (CITRACAL+D) 315-200  MG-UNIT tablet Take 1 tablet by mouth 2 (two) times daily.    . Cholecalciferol (VITAMIN D3) 2000 UNITS TABS Take by mouth. Taking 3 times weekly    . Cyanocobalamin (VITAMIN B-12 PO) Take 1 tablet by mouth daily.    Marland Kitchen esomeprazole (NEXIUM) 40 MG capsule TAKE 1 CAPSULE DAILY BEFOREBREAKFAST 90 capsule 3  . furosemide (LASIX) 40 MG tablet TAKE 1 TABLET DAILY FOR    EDEMA 90 tablet 3  . gabapentin (NEURONTIN) 300 MG capsule Take 1 capsule (300 mg total) by mouth at bedtime. 30 capsule 3  . glipiZIDE (GLUCOTROL XL) 5 MG 24 hr tablet Take 1 tablet (5 mg total) by mouth daily. 90 tablet 3  . glucose blood (ONE TOUCH ULTRA TEST) test strip Use as instructed; 300 each 5  . glucose blood (ONETOUCH VERIO) test strip Use to test blood sugar twice daily 300 each 3  . Lancets (ONETOUCH ULTRASOFT) lancets Use as instructed 300 each 5  . ONE TOUCH LANCETS MISC Use to test blood sugar twice daily 600 each 3  . vitamin C (ASCORBIC ACID) 500 MG tablet Take 500 mg by mouth daily.     No current facility-administered medications for this visit.    REVIEW OF SYSTEMS:   Constitutional: Denies fevers, chills or abnormal night sweats Eyes: Denies blurriness of vision, double vision or watery eyes Ears, nose, mouth, throat, and face: Denies mucositis or sore throat Respiratory: Denies cough, dyspnea or wheezes Cardiovascular: Denies palpitation, chest discomfort or lower extremity swelling Gastrointestinal:  Denies nausea, heartburn or change in bowel habits Skin: Denies abnormal skin rashes Lymphatics: Denies new lymphadenopathy or easy bruising Neurological:Denies numbness, tingling or new weaknesses Behavioral/Psych: Mood is stable, no new changes  Breast:  Denies any palpable lumps or discharge All other systems were reviewed with the patient and are negative.  PHYSICAL EXAMINATION: ECOG PERFORMANCE STATUS: 1 - Symptomatic but completely ambulatory  Filed Vitals:   07/26/15 1248  BP: 140/54  Pulse:  89  Temp: 98 F (36.7 C)  Resp: 18   Filed Weights   07/26/15 1248  Weight: 158 lb 4.8 oz (71.804 kg)    GENERAL:alert, no distress and comfortable SKIN: skin color, texture, turgor are normal, no rashes or significant lesions EYES: normal, conjunctiva are pink and non-injected, sclera clear OROPHARYNX:no exudate, no erythema and lips, buccal mucosa, and tongue normal  NECK: supple, thyroid normal size, non-tender, without nodularity LYMPH:  no palpable lymphadenopathy in the cervical, axillary or inguinal LUNGS: clear to auscultation and percussion with normal breathing effort HEART: regular rate & rhythm and no murmurs and no lower extremity edema ABDOMEN:abdomen soft, non-tender and normal bowel sounds Musculoskeletal:no cyanosis of digits and no  clubbing  PSYCH: alert & oriented x 3 with fluent speech NEURO: no focal motor/sensory deficits BREAST: No palpable nodules in breast. No palpable axillary or supraclavicular lymphadenopathy (exam performed in the presence of a chaperone)   LABORATORY DATA:  I have reviewed the data as listed Lab Results  Component Value Date   WBC 5.0 07/26/2015   HGB 12.5 07/26/2015   HCT 37.8 07/26/2015   MCV 88.4 07/26/2015   PLT 230 07/26/2015   Lab Results  Component Value Date   NA 141 07/26/2015   K 4.1 07/26/2015   CL 104 05/01/2015   CO2 29 07/26/2015    RADIOGRAPHIC STUDIES: I have personally reviewed the radiological reports and agreed with the findings in the report.  ASSESSMENT AND PLAN:  Breast cancer of upper-outer quadrant of right female breast ([place]) Right breast needle biopsy 10:00 07/19/2015: Invasive ductal carcinoma grade 1, ER 90%, PR 100%, HER-2 negative ratio 1.3, Ki-67 5%, 0.7 cm right breast distortion , T1 BN 0 stage I a clinical stage  Pathology and radiology review:Discussed with the patient, the details of pathology including the type of breast cancer,the clinical staging, the significance of ER, PR and  HER-2/neu receptors and the implications for treatment. After reviewing the pathology in detail, we proceeded to discuss the different treatment options between surgery, and radiation,   Recommendation: 1. Breast conserving surgery ( no need to do an axillary staging) 2. Followed by adjuvant radiation therapy  Patient does not need chemotherapy or antiestrogen therapy because she is ER/PR negative. Return to clinic after surgery to discuss final pathology report. It is quite probable that she might follow with Dr. Donne Hazel for surveillance of breast cancer down the line and may not need to see [place] in the medical oncology setting.  All questions were answered. The patient knows to call the clinic with any problems, questions or concerns.    Rulon Eisenmenger, MD 07/26/2015

## 2015-07-28 ENCOUNTER — Encounter (HOSPITAL_BASED_OUTPATIENT_CLINIC_OR_DEPARTMENT_OTHER): Payer: Self-pay | Admitting: *Deleted

## 2015-07-28 NOTE — Progress Notes (Signed)
  [facility] Psychosocial Distress Screening Counseling Intern   Clinical Social Work was referred by distress screening protocol.  The patient scored a 1 on the Psychosocial Distress Thermometer which indicates Mild distress. Counseling Intern to assess for distress and other psychosocial needs.   ONCBCN DISTRESS SCREENING 07/26/2015  Screening Type Initial Screening  Distress experienced in past week (1-10) 1  Physical Problem type Pain  Referral to support programs Yes   Counseling Intern Note: Briefly met with Pt, husband, and daughter at the end of [facility]. Pt and family expressed low level of distress compatible with Pt distress screening.  Counseling intern introduced and provided information about services available at the support center.  Pt and family stated that they would reach out as needed.   No specific support center follow up is needed at this time.  Vaughan Sine Counseling Intern [phone]

## 2015-08-01 ENCOUNTER — Encounter (HOSPITAL_BASED_OUTPATIENT_CLINIC_OR_DEPARTMENT_OTHER): Payer: Self-pay | Admitting: *Deleted

## 2015-08-01 ENCOUNTER — Encounter (HOSPITAL_BASED_OUTPATIENT_CLINIC_OR_DEPARTMENT_OTHER)
Admission: RE | Admit: 2015-08-01 | Discharge: 2015-08-01 | Disposition: A | Discharge status: Home / Self Care | Payer: Medicare Other | Source: Ambulatory Visit | Attending: General Surgery | Admitting: General Surgery

## 2015-08-01 DIAGNOSIS — Z1231 Encounter for screening mammogram for malignant neoplasm of breast: Secondary | ICD-10-CM | POA: Diagnosis not present

## 2015-08-01 DIAGNOSIS — C50411 Malignant neoplasm of upper-outer quadrant of right female breast: Secondary | ICD-10-CM | POA: Diagnosis not present

## 2015-08-01 DIAGNOSIS — Z803 Family history of malignant neoplasm of breast: Secondary | ICD-10-CM | POA: Diagnosis not present

## 2015-08-01 DIAGNOSIS — E7800 Pure hypercholesterolemia, unspecified: Secondary | ICD-10-CM | POA: Diagnosis not present

## 2015-08-01 DIAGNOSIS — E119 Type 2 diabetes mellitus without complications: Secondary | ICD-10-CM | POA: Diagnosis not present

## 2015-08-01 DIAGNOSIS — Z0000 Encounter for general adult medical examination without abnormal findings: Secondary | ICD-10-CM | POA: Diagnosis not present

## 2015-08-01 DIAGNOSIS — K219 Gastro-esophageal reflux disease without esophagitis: Secondary | ICD-10-CM | POA: Diagnosis not present

## 2015-08-01 DIAGNOSIS — Z86010 Personal history of colonic polyps: Secondary | ICD-10-CM | POA: Diagnosis not present

## 2015-08-01 DIAGNOSIS — Z90710 Acquired absence of both cervix and uterus: Secondary | ICD-10-CM | POA: Diagnosis not present

## 2015-08-01 DIAGNOSIS — C50911 Malignant neoplasm of unspecified site of right female breast: Secondary | ICD-10-CM | POA: Diagnosis not present

## 2015-08-01 DIAGNOSIS — N63 Unspecified lump in breast: Secondary | ICD-10-CM | POA: Diagnosis not present

## 2015-08-01 DIAGNOSIS — Z7984 Long term (current) use of oral hypoglycemic drugs: Secondary | ICD-10-CM | POA: Diagnosis not present

## 2015-08-01 NOTE — Progress Notes (Signed)
ekg cleared by Dr. Maryland Pink

## 2015-08-02 ENCOUNTER — Ambulatory Visit (HOSPITAL_BASED_OUTPATIENT_CLINIC_OR_DEPARTMENT_OTHER): Payer: Medicare Other | Admitting: Genetic Counselor

## 2015-08-02 ENCOUNTER — Encounter: Payer: Self-pay | Admitting: Genetic Counselor

## 2015-08-02 ENCOUNTER — Other Ambulatory Visit: Payer: Medicare Other

## 2015-08-02 DIAGNOSIS — Z800 Family history of malignant neoplasm of digestive organs: Secondary | ICD-10-CM | POA: Insufficient documentation

## 2015-08-02 DIAGNOSIS — C50411 Malignant neoplasm of upper-outer quadrant of right female breast: Secondary | ICD-10-CM | POA: Diagnosis not present

## 2015-08-02 DIAGNOSIS — Z315 Encounter for genetic counseling: Secondary | ICD-10-CM

## 2015-08-02 DIAGNOSIS — Z8042 Family history of malignant neoplasm of prostate: Secondary | ICD-10-CM | POA: Diagnosis not present

## 2015-08-02 DIAGNOSIS — Z803 Family history of malignant neoplasm of breast: Secondary | ICD-10-CM | POA: Insufficient documentation

## 2015-08-02 NOTE — Progress Notes (Signed)
REFERRING PROVIDER: Burnis Medin, MD [place], [place] [address]   Ashley Lose, MD  PRIMARY PROVIDER:  Lottie Dawson, MD  PRIMARY REASON FOR VISIT:  1. Breast cancer of upper-outer quadrant of right female breast ([place])   2. Family history of breast cancer   3. Family history of prostate cancer   4. Family history of pancreatic cancer      HISTORY OF PRESENT ILLNESS:   [name], a 82 y.o. female, was seen for a [facility] cancer genetics consultation at the request of Dr. Lindi [name] due to a personal and family history of cancer.  [name] presents to clinic today to discuss the possibility of a hereditary predisposition to cancer, genetic testing, and to further clarify her future cancer risks, as well as potential cancer risks for family members.   In 2017, at the age of 24, [name] was diagnosed with invasive ductal carcinoma of the breast. She is planning on lumpectomy and radiaiton.     CANCER HISTORY:    Breast cancer of upper-outer quadrant of right female breast ([place])   07/19/2015 Initial Diagnosis Right breast biopsy: Invasive ductal carcinoma grade 2-3, PR 0%, PR 0%, Ki-67 20%, HER-2 negative ratio 1.77; 5 mm abnormality at 10:00 position, T1 BN 0 stage I a clinical stage     HORMONAL RISK FACTORS:  Menarche was at age 41.  First live birth at age 70.  OCP use for approximately 4 years.  Ovaries intact: yes.  Hysterectomy: yes.  Menopausal status: postmenopausal.  HRT use: 0 years. Colonoscopy: yes; history of colon polyps. Mammogram within the last year: yes. Number of breast biopsies: 1. Up to date with pelvic exams:  n/a. Any excessive radiation exposure in the past:  no  Past Medical History  Diagnosis Date  . Arthritis   . Diabetes mellitus   . Colon polyps     adenomatous  . Diverticulosis   . Hemorrhoids   . Dyslipidemia   . GERD (gastroesophageal reflux disease)   . Renal insufficiency   . DDD (degenerative disc  disease)   . Shingles   . Breast cancer of upper-outer quadrant of right female breast ([place]) 07/21/2015  . Breast cancer ([place])   . Peripheral edema     ankles, takes lasix  . PONV (postoperative nausea and vomiting)   . Family history of breast cancer   . Family history of prostate cancer   . Family history of pancreatic cancer     Past Surgical History  Procedure Laterality Date  . Total shoulder arthroplasty Right 2010  . Total knee arthroplasty Right 2003  . Total knee arthroplasty Left 2010  . Umbilical hernia repair  1958  . Vaginal hysterectomy  1975  . Carpal tunnel release Right 1977  . Cataract extraction w/ intraocular lens  implant, bilateral Bilateral     left 1996; right 1997  . Ganglion cyst excision Right 2003    hand  . Joint replacement      Social History   Social History  . Marital Status: Married    Spouse Name: N/A  . Number of Children: 2  . Years of Education: N/A   Occupational History  . Retired    Social History Main Topics  . Smoking status: Never Smoker   . Smokeless tobacco: Never Used  . Alcohol Use: No  . Drug Use: No  . Sexual Activity: No   Other Topics Concern  . None   Social History Narrative  HH 2 married   Works for [employer] for 40 years retired   No pets      FAMILY HISTORY:  We obtained a detailed, 4-generation family history.  Significant diagnoses are listed below: Family History  Problem Relation Age of Onset  . Colon cancer Neg Hx   . Stomach cancer Neg Hx   . Coronary artery disease Brother     MI in his 20s  . Pancreatic cancer Mother 75  . Diabetes Mother   . Prostate cancer Brother   . Diabetes Father   . Heart disease Father   . Diabetes Brother   . Heart attack Brother   . Diabetes Sister   . Diabetes Daughter   . Diabetes Son     The patient has one son, who passed away in a MVA, and a duaghter who is cancer free.  She has two sisters and three brothers.  One brother had prostate cancer.   One sister and one brother each had a daughter with breast cancer, one diagnosed under 67.  The patient's mother was diagnosed with pancreatic cancer at 76.  There is no other reported family history of cancer.  Patient's maternal ancestors are of Caucasian descent. There is no reported Ashkenazi Jewish ancestry. There is no known consanguinity.  GENETIC COUNSELING ASSESSMENT: [name] is a 82 y.o. female with a personal and family history of breast cancer and family history of prostate cancer and pancreatic cancer which is somewhat suggestive of a hereditary cancer syndrome and predisposition to cancer. We, therefore, discussed and recommended the following at today's visit.   DISCUSSION: We discussed that only about 5-10% of breast cancer is hereditary with most cases being due to BRCA mutations.  We reviewed the characteristics, features and inheritance patterns of hereditary cancer syndromes. We also discussed genetic testing, including the appropriate family members to test, the process of testing, insurance coverage and turn-around-time for results. We discussed the implications of a negative, positive and/or variant of uncertain significant result. We recommended [name] pursue genetic testing for the Breast/Ovarian cancer gene panel. The Breast/Ovarian gene panel offered by [facility] includes sequencing and rearrangement analysis for the following 20 genes:  ATM, BARD1, BRCA1, BRCA2, BRIP1, CDH1, CHEK2, EPCAM, FANCC, MLH1, MSH2, MSH6, NBN, PALB2, PMS2, PTEN, RAD51C, RAD51D, TP53, and XRCC2.     Based on [name]'s personal and family history of cancer, she meets medical criteria for genetic testing. Despite that she meets criteria, she may still have an out of pocket cost. We discussed that if her out of pocket cost for testing is over $100, the laboratory will call and confirm whether she wants to proceed with testing.  If the out of pocket cost of testing is less than $100 she will be billed  by the genetic testing laboratory.   PLAN: After considering the risks, benefits, and limitations, [name]  provided informed consent to pursue genetic testing and the blood sample was sent to [facility] for analysis of the Breast/Ovarian Cancer panel. Results should be available within approximately 2-3 weeks' time, at which point they will be disclosed by telephone to [name], as will any additional recommendations warranted by these results. [name] will receive a summary of her genetic counseling visit and a copy of her results once available. This information will also be available in Epic. We encouraged [name] to remain in contact with cancer genetics annually so that we can continuously update the family history and inform her of  any changes in cancer genetics and testing that may be of benefit for her family. [name] questions were answered to her satisfaction today. Our contact information was provided should additional questions or concerns arise.  Lastly, we encouraged [name] to remain in contact with cancer genetics annually so that we can continuously update the family history and inform her of any changes in cancer genetics and testing that may be of benefit for this family.   Ms.  [name] questions were answered to her satisfaction today. Our contact information was provided should additional questions or concerns arise. Thank you for the referral and allowing [place] to share in the care of your patient.   Temitayo Covalt P. Florene Glen, [place], [facility] Certified Genetic Counselor [email or web address] phone: [phone]  The patient was seen for a total of 60 minutes in face-to-face genetic counseling.  This patient was discussed with Drs. Magrinat, [name] and/or Burr Medico who agrees with the above.    _______________________________________________________________________ For Office Staff:  Number of people involved in session: 2 Was an Intern/ student involved with case:  yes

## 2015-08-03 ENCOUNTER — Ambulatory Visit (HOSPITAL_BASED_OUTPATIENT_CLINIC_OR_DEPARTMENT_OTHER): Payer: Medicare Other | Admitting: Anesthesiology

## 2015-08-03 ENCOUNTER — Encounter (HOSPITAL_BASED_OUTPATIENT_CLINIC_OR_DEPARTMENT_OTHER): Payer: Self-pay | Admitting: *Deleted

## 2015-08-03 ENCOUNTER — Encounter (HOSPITAL_BASED_OUTPATIENT_CLINIC_OR_DEPARTMENT_OTHER)
Admission: RE | Disposition: A | Discharge status: Home / Self Care | Payer: Self-pay | Source: Ambulatory Visit | Attending: General Surgery

## 2015-08-03 ENCOUNTER — Ambulatory Visit (HOSPITAL_BASED_OUTPATIENT_CLINIC_OR_DEPARTMENT_OTHER)
Admission: RE | Admit: 2015-08-03 | Discharge: 2015-08-03 | Disposition: A | Discharge status: Home / Self Care | Payer: Medicare Other | Source: Ambulatory Visit | Attending: General Surgery | Admitting: General Surgery

## 2015-08-03 DIAGNOSIS — Z7984 Long term (current) use of oral hypoglycemic drugs: Secondary | ICD-10-CM | POA: Diagnosis not present

## 2015-08-03 DIAGNOSIS — E119 Type 2 diabetes mellitus without complications: Secondary | ICD-10-CM | POA: Insufficient documentation

## 2015-08-03 DIAGNOSIS — C50411 Malignant neoplasm of upper-outer quadrant of right female breast: Secondary | ICD-10-CM | POA: Diagnosis not present

## 2015-08-03 DIAGNOSIS — Z86010 Personal history of colonic polyps: Secondary | ICD-10-CM | POA: Insufficient documentation

## 2015-08-03 DIAGNOSIS — K219 Gastro-esophageal reflux disease without esophagitis: Secondary | ICD-10-CM | POA: Diagnosis not present

## 2015-08-03 DIAGNOSIS — Z0000 Encounter for general adult medical examination without abnormal findings: Secondary | ICD-10-CM | POA: Diagnosis not present

## 2015-08-03 DIAGNOSIS — C50911 Malignant neoplasm of unspecified site of right female breast: Secondary | ICD-10-CM | POA: Insufficient documentation

## 2015-08-03 DIAGNOSIS — E7800 Pure hypercholesterolemia, unspecified: Secondary | ICD-10-CM | POA: Insufficient documentation

## 2015-08-03 DIAGNOSIS — N63 Unspecified lump in breast: Secondary | ICD-10-CM | POA: Diagnosis not present

## 2015-08-03 DIAGNOSIS — Z803 Family history of malignant neoplasm of breast: Secondary | ICD-10-CM | POA: Diagnosis not present

## 2015-08-03 DIAGNOSIS — Z90710 Acquired absence of both cervix and uterus: Secondary | ICD-10-CM | POA: Insufficient documentation

## 2015-08-03 DIAGNOSIS — Z1231 Encounter for screening mammogram for malignant neoplasm of breast: Secondary | ICD-10-CM | POA: Diagnosis not present

## 2015-08-03 HISTORY — PX: BREAST LUMPECTOMY WITH RADIOACTIVE SEED LOCALIZATION: SHX6424

## 2015-08-03 HISTORY — DX: Other specified postprocedural states: Z98.890

## 2015-08-03 HISTORY — DX: Nausea with vomiting, unspecified: R11.2

## 2015-08-03 LAB — GLUCOSE, CAPILLARY
Glucose-Capillary: 127 mg/dL — ABNORMAL HIGH (ref 65–99)
Glucose-Capillary: 114 mg/dL — ABNORMAL HIGH (ref 65–99)

## 2015-08-03 SURGERY — BREAST LUMPECTOMY WITH RADIOACTIVE SEED LOCALIZATION
Anesthesia: General | Site: Breast | Laterality: Right

## 2015-08-03 MED ORDER — OXYCODONE HCL 5 MG/5ML PO SOLN: 5.0000 mg | Freq: Once | ORAL | Status: AC | PRN

## 2015-08-03 MED ORDER — MEPERIDINE HCL 25 MG/ML IJ SOLN: 6.2500 mg | INTRAMUSCULAR | Status: DC | PRN

## 2015-08-03 MED ORDER — HYDROMORPHONE HCL 1 MG/ML IJ SOLN
INTRAMUSCULAR | Status: AC
  Filled 2015-08-03: qty 1

## 2015-08-03 MED ORDER — HYDROMORPHONE HCL 1 MG/ML IJ SOLN
0.2500 mg | INTRAMUSCULAR | Status: DC | PRN
  Administered 2015-08-03: 0.25 mg via INTRAVENOUS

## 2015-08-03 MED ORDER — OXYCODONE HCL 5 MG PO TABS
ORAL_TABLET | ORAL | Status: AC
  Filled 2015-08-03: qty 1

## 2015-08-03 MED ORDER — FENTANYL CITRATE (PF) 100 MCG/2ML IJ SOLN
50.0000 ug | INTRAMUSCULAR | Status: DC | PRN
  Administered 2015-08-03: 50 ug via INTRAVENOUS

## 2015-08-03 MED ORDER — SCOPOLAMINE 1 MG/3DAYS TD PT72: 1.0000 | MEDICATED_PATCH | Freq: Once | TRANSDERMAL | Status: DC | PRN

## 2015-08-03 MED ORDER — ONDANSETRON HCL 4 MG/2ML IJ SOLN
INTRAMUSCULAR | Status: DC | PRN
  Administered 2015-08-03: 4 mg via INTRAVENOUS

## 2015-08-03 MED ORDER — BUPIVACAINE HCL (PF) 0.25 % IJ SOLN
INTRAMUSCULAR | Status: DC | PRN
  Administered 2015-08-03: 8 mL

## 2015-08-03 MED ORDER — MIDAZOLAM HCL 2 MG/2ML IJ SOLN: 1.0000 mg | INTRAMUSCULAR | Status: DC | PRN

## 2015-08-03 MED ORDER — CEFAZOLIN SODIUM-DEXTROSE 2-3 GM-% IV SOLR
2.0000 g | INTRAVENOUS | Status: AC
  Administered 2015-08-03: 2 g via INTRAVENOUS

## 2015-08-03 MED ORDER — LACTATED RINGERS IV SOLN
INTRAVENOUS | Status: DC
  Administered 2015-08-03 (×2): via INTRAVENOUS

## 2015-08-03 MED ORDER — FENTANYL CITRATE (PF) 100 MCG/2ML IJ SOLN: 25.0000 ug | INTRAMUSCULAR | Status: DC | PRN

## 2015-08-03 MED ORDER — HYDROCODONE-ACETAMINOPHEN 10-325 MG PO TABS: 1.0000 | ORAL_TABLET | Freq: Four times a day (QID) | ORAL | Status: DC | PRN

## 2015-08-03 MED ORDER — LIDOCAINE HCL (CARDIAC) 20 MG/ML IV SOLN
INTRAVENOUS | Status: AC
  Filled 2015-08-03: qty 5

## 2015-08-03 MED ORDER — PHENYLEPHRINE HCL 10 MG/ML IJ SOLN
INTRAMUSCULAR | Status: DC | PRN
  Administered 2015-08-03: 80 ug via INTRAVENOUS

## 2015-08-03 MED ORDER — PROPOFOL 10 MG/ML IV BOLUS
INTRAVENOUS | Status: DC | PRN
  Administered 2015-08-03: 150 mg via INTRAVENOUS

## 2015-08-03 MED ORDER — GLYCOPYRROLATE 0.2 MG/ML IJ SOLN: 0.2000 mg | Freq: Once | INTRAMUSCULAR | Status: DC | PRN

## 2015-08-03 MED ORDER — OXYCODONE HCL 5 MG PO TABS
5.0000 mg | ORAL_TABLET | Freq: Once | ORAL | Status: AC | PRN
  Administered 2015-08-03: 5 mg via ORAL

## 2015-08-03 MED ORDER — CEFAZOLIN SODIUM-DEXTROSE 2-3 GM-% IV SOLR
INTRAVENOUS | Status: AC
  Filled 2015-08-03: qty 50

## 2015-08-03 MED ORDER — LIDOCAINE HCL (CARDIAC) 20 MG/ML IV SOLN
INTRAVENOUS | Status: DC | PRN
  Administered 2015-08-03: 50 mg via INTRAVENOUS

## 2015-08-03 MED ORDER — ONDANSETRON HCL 4 MG/2ML IJ SOLN
INTRAMUSCULAR | Status: AC
  Filled 2015-08-03: qty 2

## 2015-08-03 MED ORDER — FENTANYL CITRATE (PF) 100 MCG/2ML IJ SOLN
INTRAMUSCULAR | Status: AC
  Filled 2015-08-03: qty 2

## 2015-08-03 SURGICAL SUPPLY — 57 items
APPLIER CLIP 9.375 MED OPEN (MISCELLANEOUS) ×2
APR CLP MED 9.3 20 MLT OPN (MISCELLANEOUS) ×1
BINDER BREAST LRG (GAUZE/BANDAGES/DRESSINGS) IMPLANT
BINDER BREAST MEDIUM (GAUZE/BANDAGES/DRESSINGS) IMPLANT
BINDER BREAST XLRG (GAUZE/BANDAGES/DRESSINGS) ×2 IMPLANT
BINDER BREAST XXLRG (GAUZE/BANDAGES/DRESSINGS) IMPLANT
BLADE SURG 15 STRL LF DISP TIS (BLADE) ×1 IMPLANT
BLADE SURG 15 STRL SS (BLADE) ×2
CANISTER SUC SOCK COL 7IN (MISCELLANEOUS) IMPLANT
CANISTER SUCT 1200ML W/VALVE (MISCELLANEOUS) IMPLANT
CHLORAPREP W/TINT 26ML (MISCELLANEOUS) ×2 IMPLANT
CLIP APPLIE 9.375 MED OPEN (MISCELLANEOUS) ×1 IMPLANT
COVER BACK TABLE 60X90IN (DRAPES) ×2 IMPLANT
COVER MAYO STAND STRL (DRAPES) ×2 IMPLANT
COVER PROBE W GEL 5X96 (DRAPES) ×2 IMPLANT
DECANTER SPIKE VIAL GLASS SM (MISCELLANEOUS) IMPLANT
DEVICE DUBIN W/COMP PLATE 8390 (MISCELLANEOUS) ×2 IMPLANT
DRAPE LAPAROSCOPIC ABDOMINAL (DRAPES) ×2 IMPLANT
DRAPE UTILITY XL STRL (DRAPES) ×2 IMPLANT
DRSG TEGADERM 4X4.75 (GAUZE/BANDAGES/DRESSINGS) IMPLANT
ELECT COATED BLADE 2.86 ST (ELECTRODE) ×2 IMPLANT
ELECT REM PT RETURN 9FT ADLT (ELECTROSURGICAL) ×2
ELECTRODE REM PT RTRN 9FT ADLT (ELECTROSURGICAL) ×1 IMPLANT
GLOVE BIO SURGEON STRL SZ7 (GLOVE) ×4 IMPLANT
GLOVE BIOGEL M STRL SZ7.5 (GLOVE) ×2 IMPLANT
GLOVE BIOGEL PI IND STRL 7.5 (GLOVE) ×1 IMPLANT
GLOVE BIOGEL PI IND STRL 8 (GLOVE) ×1 IMPLANT
GLOVE BIOGEL PI INDICATOR 7.5 (GLOVE) ×1
GLOVE BIOGEL PI INDICATOR 8 (GLOVE) ×1
GOWN STRL REUS W/ TWL LRG LVL3 (GOWN DISPOSABLE) ×2 IMPLANT
GOWN STRL REUS W/TWL LRG LVL3 (GOWN DISPOSABLE) ×4
ILLUMINATOR WAVEGUIDE N/F (MISCELLANEOUS) IMPLANT
KIT MARKER MARGIN INK (KITS) ×2 IMPLANT
LIGHT WAVEGUIDE WIDE FLAT (MISCELLANEOUS) IMPLANT
LIQUID BAND (GAUZE/BANDAGES/DRESSINGS) ×2 IMPLANT
MARKER SKIN DUAL TIP RULER LAB (MISCELLANEOUS) ×2 IMPLANT
NEEDLE HYPO 25X1 1.5 SAFETY (NEEDLE) ×2 IMPLANT
NS IRRIG 1000ML POUR BTL (IV SOLUTION) IMPLANT
PACK BASIN DAY SURGERY FS (CUSTOM PROCEDURE TRAY) ×2 IMPLANT
PENCIL BUTTON HOLSTER BLD 10FT (ELECTRODE) ×2 IMPLANT
SLEEVE SCD COMPRESS KNEE MED (MISCELLANEOUS) ×2 IMPLANT
SPONGE GAUZE 4X4 12PLY STER LF (GAUZE/BANDAGES/DRESSINGS) IMPLANT
SPONGE LAP 4X18 X RAY DECT (DISPOSABLE) ×2 IMPLANT
STRIP CLOSURE SKIN 1/2X4 (GAUZE/BANDAGES/DRESSINGS) ×2 IMPLANT
SUT MNCRL AB 4-0 PS2 18 (SUTURE) ×2 IMPLANT
SUT MON AB 5-0 PS2 18 (SUTURE) IMPLANT
SUT SILK 2 0 SH (SUTURE) ×2 IMPLANT
SUT VIC AB 2-0 SH 27 (SUTURE) ×2
SUT VIC AB 2-0 SH 27XBRD (SUTURE) ×1 IMPLANT
SUT VIC AB 3-0 SH 27 (SUTURE) ×2
SUT VIC AB 3-0 SH 27X BRD (SUTURE) ×1 IMPLANT
SUT VIC AB 5-0 PS2 18 (SUTURE) IMPLANT
SYR CONTROL 10ML LL (SYRINGE) ×2 IMPLANT
TOWEL OR 17X24 6PK STRL BLUE (TOWEL DISPOSABLE) ×2 IMPLANT
TOWEL OR NON WOVEN STRL DISP B (DISPOSABLE) ×2 IMPLANT
TUBE CONNECTING 20X1/4 (TUBING) IMPLANT
YANKAUER SUCT BULB TIP NO VENT (SUCTIONS) ×2 IMPLANT

## 2015-08-03 NOTE — Op Note (Signed)
Preoperative diagnosis: stage I right breast cancer Postoperative diagnosis: same as above Procedure: Right breast seed guided lumpectomy Surgeon Dr Serita Grammes Anes general  EBL: minimal Comps none Specimen:  1. right breast marked with paint 2. Additional anterior, medial and superior margins marked short superior, long lateral, double deep Sponge count correct at completion dispo to recovery stable  Indications: this is an 54 yof who has clinical stage I right breast cancer. We discussed proceeding with lumpectomy after being seen in our mdc. She had radioactive seed placed prior to beginning. I had these mm in the OR.   Procedure: After informed consent was obtained the patient was taken to the OR.  She was given ancef. SCDs were in place. She was prepped and draped in the standard sterile surgical fashion. A timeout was performed. I infiltrated marcaine in the far lateral right breast. then made a curvilinear incision.   I then removed the seed with an attempt to get a clear margin.  I did confirm removal of both the seed and the clip with mammography.I removed some additional anterior, medial and superior tissue to clear these margins as it appeared close. I placed clips in the cavity. .  I closed the deep tissue with 2-0 vicryl. The superficial tissue was closedwith 3-0 vicryl and the skin was then closed with 4-0 monocryl Glue and steristrips were applied. A breast binder was placed. She was extubated and transferred to recovery stable

## 2015-08-03 NOTE — Interval H&P Note (Signed)
History and Physical Interval Note:  08/03/2015 2:55 PM  [name]  has presented today for surgery, with the diagnosis of right breast cancer  The various methods of treatment have been discussed with the patient and family. After consideration of risks, benefits and other options for treatment, the patient has consented to  Procedure(s): BREAST LUMPECTOMY WITH RADIOACTIVE SEED LOCALIZATION (Right) as a surgical intervention .  The patient's history has been reviewed, patient examined, no change in status, stable for surgery.  I have reviewed the patient's chart and labs.  Questions were answered to the patient's satisfaction.     [name]

## 2015-08-03 NOTE — Anesthesia Preprocedure Evaluation (Signed)
Anesthesia Evaluation  Patient identified by MRN, date of birth, ID band Patient awake    Reviewed: Allergy & Precautions, NPO status , Patient's Chart, lab work & pertinent test results  History of Anesthesia Complications (+) PONV  Airway Mallampati: II  TM Distance: >3 FB Neck ROM: Full    Dental  (+) Teeth Intact, Dental Advisory Given   Pulmonary    breath sounds clear to auscultation       Cardiovascular  Rhythm:Regular Rate:Normal     Neuro/Psych    GI/Hepatic GERD  Medicated and Controlled,  Endo/Other  diabetes, Type 2, Oral Hypoglycemic Agents  Renal/GU      Musculoskeletal   Abdominal   Peds  Hematology   Anesthesia Other Findings   Reproductive/Obstetrics                             Anesthesia Physical Anesthesia Plan  ASA: III  Anesthesia Plan: General   Post-op Pain Management:    Induction: Intravenous  Airway Management Planned: LMA  Additional Equipment:   Intra-op Plan:   Post-operative Plan: Extubation in OR  Informed Consent: I have reviewed the patients History and Physical, chart, labs and discussed the procedure including the risks, benefits and alternatives for the proposed anesthesia with the patient or authorized representative who has indicated his/her understanding and acceptance.   Dental advisory given  Plan Discussed with: CRNA, Anesthesiologist and Surgeon  Anesthesia Plan Comments:         Anesthesia Quick Evaluation

## 2015-08-03 NOTE — Anesthesia Procedure Notes (Signed)
Procedure Name: LMA Insertion Date/Time: 08/03/2015 3:14 PM Performed by: Lieutenant Diego Pre-anesthesia Checklist: Patient identified, Emergency Drugs available, Suction available and Patient being monitored Patient Re-evaluated:Patient Re-evaluated prior to inductionOxygen Delivery Method: Circle System Utilized Preoxygenation: Pre-oxygenation with 100% oxygen Intubation Type: IV induction Ventilation: Mask ventilation without difficulty LMA: LMA inserted LMA Size: 4.0 Number of attempts: 1 Airway Equipment and Method: Bite block Placement Confirmation: positive ETCO2 and breath sounds checked- equal and bilateral Tube secured with: Tape Dental Injury: Teeth and Oropharynx as per pre-operative assessment

## 2015-08-03 NOTE — Discharge Instructions (Signed)
[facility] °Office Phone Number [phone] ° °POST OP INSTRUCTIONS ° °Always review your discharge instruction sheet given to you by the facility where your surgery was performed. ° °IF YOU HAVE DISABILITY OR FAMILY LEAVE FORMS, YOU MUST BRING THEM TO THE OFFICE FOR PROCESSING.  DO NOT GIVE THEM TO YOUR DOCTOR. ° °1. A prescription for pain medication may be given to you upon discharge.  Take your pain medication as prescribed, if needed.  If narcotic pain medicine is not needed, then you may take acetaminophen (Tylenol), naprosyn (Alleve) or ibuprofen (Advil) as needed. °2. Take your usually prescribed medications unless otherwise directed °3. If you need a refill on your pain medication, please contact your pharmacy.  They will contact our office to request authorization.  Prescriptions will not be filled after 5pm or on week-ends. °4. You should eat very light the first 24 hours after surgery, such as soup, crackers, pudding, etc.  Resume your normal diet the day after surgery. °5. Most patients will experience some swelling and bruising in the breast.  Ice packs and a good support bra will help.  Wear the breast binder provided or a sports bra for 72 hours day and night.  After that wear a sports bra during the day until you return to the office. Swelling and bruising can take several days to resolve.  °6. It is common to experience some constipation if taking pain medication after surgery.  Increasing fluid intake and taking a stool softener will usually help or prevent this problem from occurring.  A mild laxative (Milk of Magnesia or Miralax) should be taken according to package directions if there are no bowel movements after 48 hours. °7. Unless discharge instructions indicate otherwise, you may remove your bandages 48 hours after surgery and you may shower at that time.  You may have steri-strips (small skin tapes) in place directly over the incision.  These strips should be left on the  skin for 7-10 days and will come off on their own.  If your surgeon used skin glue on the incision, you may shower in 24 hours.  The glue will flake off over the next 2-3 weeks.  Any sutures or staples will be removed at the office during your follow-up visit. °8. ACTIVITIES:  You may resume regular daily activities (gradually increasing) beginning the next day.  Wearing a good support bra or sports bra minimizes pain and swelling.  You may have sexual intercourse when it is comfortable. °a. You may drive when you no longer are taking prescription pain medication, you can comfortably wear a seatbelt, and you can safely maneuver your car and apply brakes. °b. RETURN TO WORK:  ______________________________________________________________________________________ °9. You should see your doctor in the office for a follow-up appointment approximately two weeks after your surgery.  Your doctor’s nurse will typically make your follow-up appointment when she calls you with your pathology report.  Expect your pathology report 3-4 business days after your surgery.  You may call to check if you do not hear from us after three days. °10. OTHER INSTRUCTIONS: _______________________________________________________________________________________________ _____________________________________________________________________________________________________________________________________ °_____________________________________________________________________________________________________________________________________ °_____________________________________________________________________________________________________________________________________ ° °WHEN TO CALL DR WAKEFIELD: °1. Fever over 101.0 °2. Nausea and/or vomiting. °3. Extreme swelling or bruising. °4. Continued bleeding from incision. °5. Increased pain, redness, or drainage from the incision. ° °The clinic staff is available to answer your questions during regular  business hours.  Please don’t hesitate to call and ask to speak to one of the nurses for clinical concerns.  If   you have a medical emergency, go to the nearest emergency room or call 911.  A surgeon from [facility] is always on call at the hospital. ° °For further questions, please visit [email or web address] ° ° ° °Post Anesthesia Home Care Instructions ° °Activity: °Get plenty of rest for the remainder of the day. A responsible adult should stay with you for 24 hours following the procedure.  °For the next 24 hours, DO NOT: °-Drive a car °-Operate machinery °-Drink alcoholic beverages °-Take any medication unless instructed by your physician °-Make any legal decisions or sign important papers. ° °Meals: °Start with liquid foods such as gelatin or soup. Progress to regular foods as tolerated. Avoid greasy, spicy, heavy foods. If nausea and/or vomiting occur, drink only clear liquids until the nausea and/or vomiting subsides. Call your physician if vomiting continues. ° °Special Instructions/Symptoms: °Your throat may feel dry or sore from the anesthesia or the breathing tube placed in your throat during surgery. If this causes discomfort, gargle with warm salt water. The discomfort should disappear within 24 hours. ° °If you had a scopolamine patch placed behind your ear for the management of post- operative nausea and/or vomiting: ° °1. The medication in the patch is effective for 72 hours, after which it should be removed.  Wrap patch in a tissue and discard in the trash. Wash hands thoroughly with soap and water. °2. You may remove the patch earlier than 72 hours if you experience unpleasant side effects which may include dry mouth, dizziness or visual disturbances. °3. Avoid touching the patch. Wash your hands with soap and water after contact with the patch. °  ° °

## 2015-08-03 NOTE — Transfer of Care (Signed)
Immediate Anesthesia Transfer of Care Note  Patient: [name]  Procedure(s) Performed: Procedure(s): BREAST LUMPECTOMY WITH RADIOACTIVE SEED LOCALIZATION (Right)  Patient Location: PACU  Anesthesia Type:General  Level of Consciousness: awake  Airway & Oxygen Therapy: Patient Spontanous Breathing and Patient connected to face mask oxygen  Post-op Assessment: Report given to RN and Post -op Vital signs reviewed and stable  Post vital signs: Reviewed and stable  Last Vitals:  Filed Vitals:   08/03/15 1349  BP: 135/56  Pulse: 96  Temp: 36.4 C  Resp: 18    Complications: No apparent anesthesia complications

## 2015-08-03 NOTE — H&P (Signed)
82 yof who is otherwise fairly healthy with no prior breast history presents with a screening mm that shows density c breasts and a right breast mass. US shows a 5 mm mass in the right breast, negative axilla. she has no breast complaints. this underwent biopsy that shows a grade II-III IDC that is triple negative with Ki of 20%. she is here with daughter and husband to discuss options.    Other Problems Conni Slipper, RN; 07/25/2015 4:43 PM) Diabetes Mellitus Gastroesophageal Reflux Disease Hypercholesterolemia  Past Surgical History Conni Slipper, RN; 07/25/2015 4:43 PM) Breast Biopsy Right. Cataract Surgery Bilateral. Colon Polyp Removal - Colonoscopy Hysterectomy (not due to cancer) - Partial Knee Surgery Bilateral. Shoulder Surgery Right.  Diagnostic Studies History Conni Slipper, RN; 07/25/2015 4:43 PM) Colonoscopy 1-5 years ago Mammogram within last year Pap Smear >5 years ago  Medication History Conni Slipper, RN; 07/25/2015 4:43 PM) No Current Medications Medications Reconciled  Social History Conni Slipper, RN; 07/25/2015 4:43 PM) No alcohol use No caffeine use No drug use Tobacco use Never smoker.  Family History Conni Slipper, RN; 07/25/2015 4:43 PM) Cerebrovascular Accident Sister. Diabetes Mellitus Brother, Daughter, Father, Mother, Sister. Malignant Neoplasm Of Pancreas Mother. Migraine Headache Daughter.  Pregnancy / Birth History Conni Slipper, RN; 07/25/2015 4:43 PM) Age at menarche 82 years. Gravida 2 Maternal age 41-20 Para 2  Review of Systems Conni Slipper RN; 07/25/2015 4:43 PM) General Not Present- Appetite Loss, Chills, Fatigue, Fever, Night Sweats, Weight Gain and Weight Loss. Skin Not Present- Change in Wart/Mole, Dryness, Hives, Jaundice, New Lesions, Non-Healing Wounds, Rash and Ulcer. HEENT Present- Hearing Loss and Wears glasses/contact lenses. Not Present- Earache, Hoarseness, Nose Bleed, Oral Ulcers, Ringing in the Ears,  Seasonal Allergies, Sinus Pain, Sore Throat, Visual Disturbances and Yellow Eyes. Breast Not Present- Breast Mass, Breast Pain, Nipple Discharge and Skin Changes. Cardiovascular Not Present- Chest Pain, Difficulty Breathing Lying Down, Leg Cramps, Palpitations, Rapid Heart Rate, Shortness of Breath and Swelling of Extremities. Gastrointestinal Present- Bloody Stool and Indigestion. Not Present- Abdominal Pain, Bloating, Change in Bowel Habits, Chronic diarrhea, Constipation, Difficulty Swallowing, Excessive gas, Gets full quickly at meals, Hemorrhoids, Nausea, Rectal Pain and Vomiting. Female Genitourinary Present- Painful Urination. Not Present- Frequency, Nocturia, Pelvic Pain and Urgency. Musculoskeletal Not Present- Back Pain, Joint Pain, Joint Stiffness, Muscle Pain, Muscle Weakness and Swelling of Extremities. Neurological Present- Trouble walking. Not Present- Decreased Memory, Fainting, Headaches, Numbness, Seizures, Tingling, Tremor and Weakness. Psychiatric Not Present- Anxiety, Bipolar, Change in Sleep Pattern, Depression, Fearful and Frequent crying. Endocrine Not Present- Cold Intolerance, Excessive Hunger, Hair Changes, Heat Intolerance, Hot flashes and New Diabetes. Hematology Not Present- Easy Bruising, Excessive bleeding, Gland problems, HIV and Persistent Infections.   Physical Exam [name] MD; 07/26/2015 2:28 PM) General Mental Status-Alert. Orientation-Oriented X3. Chest and Lung Exam Chest and lung exam reveals -on auscultation, normal breath sounds, no adventitious sounds and normal vocal resonance. Breast Nipples-No Discharge. Breast Lump-No Palpable Breast Mass. Cardiovascular Cardiovascular examination reveals -normal heart sounds, regular rate and rhythm with no murmurs. Lymphatic Head & Neck General Head & Neck Lymphatics: Bilateral - Description - Normal. Axillary General Axillary Region: Bilateral - Description - Normal. Note: no Temple  adenopathy   Assessment & Plan [name] MD; 07/26/2015 2:33 PM) STAGE I BREAST CANCER, RIGHT (C50.911) Story: Right breast seed guided lumpectomy  We discussed the staging and pathophysiology of breast cancer. We discussed all of the different options for treatment for breast cancer including surgery, chemotherapy, radiation therapy, Herceptin, and antiestrogen  therapy. we elected not to do a sentinel node biopsy in conjunction with rad onc and med onc. We discussed the options for treatment of the breast cancer which included lumpectomy versus a mastectomy. We discussed the performance of the lumpectomy with radioactive seed placement. We discussed a 5% chance of a positive margin requiring reexcision in the operating room. We also discussed that she will likely need radiation therapy (this is usually 5-7 weeks) if she undergoes lumpectomy. We discussed the mastectomy (removal of whole breast) and the postoperative care for that as well. We discussed that there is no difference in her survival whether she undergoes lumpectomy with radiation therapy or antiestrogen therapy versus a mastectomy. There is also no real difference between her recurrence in the breast. We discussed the risks of operation including bleeding, infection, possible reoperation. She understands her further therapy will be based on what her stages at the time of her operation.

## 2015-08-04 ENCOUNTER — Encounter (HOSPITAL_BASED_OUTPATIENT_CLINIC_OR_DEPARTMENT_OTHER): Payer: Self-pay | Admitting: General Surgery

## 2015-08-04 NOTE — Anesthesia Postprocedure Evaluation (Signed)
Anesthesia Post Note  Patient: [name]  Procedure(s) Performed: Procedure(s) (LRB): BREAST LUMPECTOMY WITH RADIOACTIVE SEED LOCALIZATION (Right)  Patient location during evaluation: PACU Anesthesia Type: General Level of consciousness: awake and alert Pain management: pain level controlled Vital Signs Assessment: post-procedure vital signs reviewed and stable Respiratory status: spontaneous breathing, nonlabored ventilation and respiratory function stable Cardiovascular status: blood pressure returned to baseline and stable Postop Assessment: no signs of nausea or vomiting Anesthetic complications: no    Last Vitals:  Filed Vitals:   08/03/15 1630 08/03/15 1649  BP: 160/83 155/75  Pulse: 85 87  Temp:  36.4 C  Resp: 13 16    Last Pain:  Filed Vitals:   08/03/15 1735  PainSc: 4                  Jarvin Ogren A

## 2015-08-09 NOTE — Assessment & Plan Note (Signed)
Rt. Lumpectomy 08/03/15: 1.2 cm, Grade 3, with DCIS (in situ margin 0.1 cm), LN not taken, Er 0%, PR 0%, Her 2 Neg ratio: 1.77 T1bNx (stage 1A)  Pathology counseling: I discussed the final pathology report of the patient provided  a copy of this report. I discussed the margins as well as lymph node surgeries. We also discussed the final staging along with previously performed ER/PR and HER-2/neu testing.  Recommendation: Adj XRT Not a candidate for chemo given her age. F/U with Rad Onc and surgery for surveillance

## 2015-08-10 ENCOUNTER — Encounter: Payer: Self-pay | Admitting: Hematology and Oncology

## 2015-08-10 ENCOUNTER — Ambulatory Visit (HOSPITAL_BASED_OUTPATIENT_CLINIC_OR_DEPARTMENT_OTHER): Payer: Medicare Other | Admitting: Hematology and Oncology

## 2015-08-10 VITALS — BP 142/58 | HR 69 | Temp 97.4°F | Resp 18 | Ht 61.0 in | Wt 159.4 lb

## 2015-08-10 DIAGNOSIS — C50411 Malignant neoplasm of upper-outer quadrant of right female breast: Secondary | ICD-10-CM

## 2015-08-10 DIAGNOSIS — Z171 Estrogen receptor negative status [ER-]: Secondary | ICD-10-CM

## 2015-08-10 NOTE — Progress Notes (Signed)
Patient Care Team: Burnis Medin, MD as PCP - General (Internal Medicine) Monna Fam, MD (Ophthalmology) Sylvan Cheese, NP as Nurse Practitioner (Hematology and Oncology)  DIAGNOSIS: Breast cancer of upper-outer quadrant of right female breast [facility] [place])   Staging form: Breast, AJCC 7th Edition     Clinical stage from 07/26/2015: Stage IA (T1a, N0, M0) - Unsigned       Staging comments: Staged at breast conference 1.18.17    SUMMARY OF ONCOLOGIC HISTORY:   Breast cancer of upper-outer quadrant of right female breast ([place])   07/19/2015 Initial Diagnosis Right breast biopsy: Invasive ductal carcinoma grade 2-3, PR 0%, PR 0%, Ki-67 20%, HER-2 negative ratio 1.77; 5 mm abnormality at 10:00 position, T1 BN 0 stage I a clinical stage   08/03/2015 Surgery Rt. Lumpectomy: 1.2 cm, Grade 3, with DCIS (in situ margin 0.1 cm), LN not taken, Er 0%, PR 0%, Her 2 Neg ratio: 1.77 T1bNx (stage 1A)    CHIEF COMPLIANT: Follow-up after recent lumpectomy  INTERVAL HISTORY: [name] is a 82 year old with above-mentioned history of right breast cancer treated with lumpectomy and is here today to discuss the pathology report. She is recovering fairly well from surgery. She was doing quite well until just started doing a lot of activity and since then she is slightly sore in the right breast. She denies any redness or discharge around the surgical area. She has felt moderately fatigued for the past couple of days.  REVIEW OF SYSTEMS:   Constitutional: Denies fevers, chills or abnormal weight loss Eyes: Denies blurriness of vision Ears, nose, mouth, throat, and face: Denies mucositis or sore throat Respiratory: Denies cough, dyspnea or wheezes Cardiovascular: Denies palpitation, chest discomfort Gastrointestinal:  Denies nausea, heartburn or change in bowel habits Skin: Denies abnormal skin rashes Lymphatics: Denies new lymphadenopathy or easy bruising Neurological:Denies numbness, tingling  or new weaknesses Behavioral/Psych: Mood is stable, no new changes  Extremities: No lower extremity edema Breast:  Soreness in the breast from recent breast surgery All other systems were reviewed with the patient and are negative.  I have reviewed the past medical history, past surgical history, social history and family history with the patient and they are unchanged from previous note.  ALLERGIES:  has No Known Allergies.  MEDICATIONS:  Current Outpatient Prescriptions  Medication Sig Dispense Refill  . aspirin 81 MG chewable tablet Chew 81 mg by mouth daily.      Marland Kitchen atorvastatin (LIPITOR) 10 MG tablet Take 1 tablet (10 mg total) by mouth daily. 90 tablet 3  . Blood Glucose Calibration (OT ULTRA/FASTTK CNTRL SOLN) SOLN Use as instructed 1 each 11  . calcium citrate-vitamin D (CITRACAL+D) 315-200 MG-UNIT tablet Take 1 tablet by mouth 2 (two) times daily.    . Cholecalciferol (VITAMIN D3) 2000 UNITS TABS Take by mouth. Taking 3 times weekly    . Cyanocobalamin (VITAMIN B-12 PO) Take 1 tablet by mouth daily.    Marland Kitchen esomeprazole (NEXIUM) 40 MG capsule TAKE 1 CAPSULE DAILY BEFOREBREAKFAST 90 capsule 3  . furosemide (LASIX) 40 MG tablet TAKE 1 TABLET DAILY FOR    EDEMA (Patient taking differently: 20 mg. TAKE 1 TABLET DAILY FOR    EDEMA) 90 tablet 3  . gabapentin (NEURONTIN) 300 MG capsule Take 1 capsule (300 mg total) by mouth at bedtime. 30 capsule 3  . glipiZIDE (GLUCOTROL XL) 5 MG 24 hr tablet Take 1 tablet (5 mg total) by mouth daily. 90 tablet 3  . glucose blood (ONE TOUCH  ULTRA TEST) test strip Use as instructed; 300 each 5  . glucose blood (ONETOUCH VERIO) test strip Use to test blood sugar twice daily 300 each 3  . HYDROcodone-acetaminophen (NORCO) 10-325 MG tablet Take 1 tablet by mouth every 6 (six) hours as needed. 10 tablet 0  . Lancets (ONETOUCH ULTRASOFT) lancets Use as instructed 300 each 5  . ONE TOUCH LANCETS MISC Use to test blood sugar twice daily 600 each 3  . vitamin C  (ASCORBIC ACID) 500 MG tablet Take 500 mg by mouth daily. Reported on 08/10/2015     No current facility-administered medications for this visit.    PHYSICAL EXAMINATION: ECOG PERFORMANCE STATUS: 1 - Symptomatic but completely ambulatory  Filed Vitals:   08/10/15 1128  BP: 142/58  Pulse: 69  Temp: 97.4 F (36.3 C)  Resp: 18   Filed Weights   08/10/15 1128  Weight: 159 lb 6.4 oz (72.303 kg)    GENERAL:alert, no distress and comfortable SKIN: skin color, texture, turgor are normal, no rashes or significant lesions EYES: normal, Conjunctiva are pink and non-injected, sclera clear OROPHARYNX:no exudate, no erythema and lips, buccal mucosa, and tongue normal  NECK: supple, thyroid normal size, non-tender, without nodularity LYMPH:  no palpable lymphadenopathy in the cervical, axillary or inguinal LUNGS: clear to auscultation and percussion with normal breathing effort HEART: regular rate & rhythm and no murmurs and no lower extremity edema ABDOMEN:abdomen soft, non-tender and normal bowel sounds MUSCULOSKELETAL:no cyanosis of digits and no clubbing  NEURO: alert & oriented x 3 with fluent speech, no focal motor/sensory deficits EXTREMITIES: No lower extremity edema  LABORATORY DATA:  I have reviewed the data as listed   Chemistry      Component Value Date/Time   NA 141 07/26/2015 1224   NA 142 05/01/2015 1113   K 4.1 07/26/2015 1224   K 4.4 05/01/2015 1113   CL 104 05/01/2015 1113   CO2 29 07/26/2015 1224   CO2 30 05/01/2015 1113   BUN 32.3* 07/26/2015 1224   BUN 26* 05/01/2015 1113   CREATININE 1.5* 07/26/2015 1224   CREATININE 1.25* 05/01/2015 1113   CREATININE 1.43* 12/27/2014 1903      Component Value Date/Time   CALCIUM 9.8 07/26/2015 1224   CALCIUM 9.7 05/01/2015 1113   ALKPHOS 90 07/26/2015 1224   ALKPHOS 90 05/01/2015 1113   AST 14 07/26/2015 1224   AST 13 05/01/2015 1113   ALT 13 07/26/2015 1224   ALT 13 05/01/2015 1113   BILITOT 0.55 07/26/2015 1224    BILITOT 0.6 05/01/2015 1113       Lab Results  Component Value Date   WBC 5.0 07/26/2015   HGB 12.5 07/26/2015   HCT 37.8 07/26/2015   MCV 88.4 07/26/2015   PLT 230 07/26/2015   NEUTROABS 2.4 07/26/2015     ASSESSMENT & PLAN:  Breast cancer of upper-outer quadrant of right female breast ([place]) Rt. Lumpectomy 08/03/15: 1.2 cm, Grade 3, with DCIS (in situ margin 0.1 cm), LN not taken, Er 0%, PR 0%, Her 2 Neg ratio: 1.77 T1bNx (stage 1A)  Pathology counseling: I discussed the final pathology report of the patient provided  a copy of this report. I discussed the margins as well as lymph node surgeries. We also discussed the final staging along with previously performed ER/PR and HER-2/neu testing.  Recommendation: Adj XRT Not a candidate for chemo given her age. F/U with Rad Onc and surgery for surveillance  return to see [place] on an as-needed basis  No orders of the defined types were placed in this encounter.   The patient has a good understanding of the overall plan. she agrees with it. she will call with any problems that may develop before the next visit here.   Rulon Eisenmenger, MD 08/10/2015

## 2015-08-11 NOTE — Progress Notes (Signed)
Location of Breast Cancer: Breast cancer of upper-outer quadrant of right female breast   Histology per Pathology Report:   08/03/15 Diagnosis 1. Breast, lumpectomy, right - INVASIVE DUCTAL CARCINOMA, SEE COMMENT. - NEGATIVE FOR LYMPH VASCULAR INVASION - INVASIVE TUMOR IS 1 MM FROM NEAREST MARGIN (SUPERIOR) - HIGH GRADE DUCTAL CARCINOMA IN SITU. - IN SITU CARCINOMA IS 0.1 MM FROM NEAREST MARGIN (SUPERIOR AND POSTERIOR) - PREVIOUS BIOPSY SITE - SEE TUMOR SYNOPTIC TEMPLATE BELOW. 2. Breast, excision, right superior margin - BENIGN BREAST TISSUE, SEE COMMENT. - NEGATIVE FOR ATYPIA AND MALIGNANCY. 1 of 4 FINAL for SYDNEE, LAMOUR B [phone]) Diagnosis(continued) - MICROCALCIFICATIONS PRESENT. - SURGICAL MARGIN, NEGATIVE FOR ATYPIA AND MALIGNANCY. 3. Breast, excision, right medial margin - BENIGN BREAST TISSUE, SEE COMMENT. - NEGATIVE FOR ATYPIA AND MALIGNANCY. - SURGICAL MARGIN, NEGATIVE FOR ATYPIA OR MALIGNANCY. 4. Breast, excision, right anterior margin - BENIGN BREAST TISSUE, SEE COMMENT. - NEGATIVE FOR ATYPIA AND MALIGNANCY. - CALCIFICATIONS PRESENT. - SURGICAL MARGIN, NEGATIVE FOR ATYPIA AND MALIGNANCY.  07/19/15 Diagnosis Breast, right, needle core biopsy - INVASIVE DUCTAL CARCINOMA. - SEE COMMENT.  Receptor Status: ER(0%), PR (0%), Her2-neu ("EQUIVOCAL" on 08/03/15)  Did patient present with symptoms (if so, please note symptoms) or was this found on screening mammography?:   Past/Anticipated interventions by surgeon, if any: 08/03/15 -Procedure: BREAST LUMPECTOMY WITH RADIOACTIVE SEED LOCALIZATION;  Surgeon: [name], MD;  Location: [facility];  Service: General;  Laterality: Right;  She reports she has a follow up appointment with Dr. Donne Hazel on 08/24/15.   Past/Anticipated interventions by medical oncology, if any: Not a candidate for chemo per Dr. Lindi Adie.  Lymphedema issues, if any:  No  Pain issues, if any: She describes a "knot" in her  Right breast where she had her surgery. She does complain of pain over her entire breast area when she moves her arm certain ways.   SAFETY ISSUES:  Prior radiation? No  Pacemaker/ICD? No  Possible current pregnancy? No  Is the patient on methotrexate? No  Current Complaints / other details:  She has no other concerns.

## 2015-08-17 ENCOUNTER — Ambulatory Visit
Admission: RE | Admit: 2015-08-17 | Discharge: 2015-08-17 | Disposition: A | Discharge status: Home / Self Care | Payer: Medicare Other | Source: Ambulatory Visit | Attending: Radiation Oncology | Admitting: Radiation Oncology

## 2015-08-17 ENCOUNTER — Encounter: Payer: Self-pay | Admitting: Radiation Oncology

## 2015-08-17 VITALS — BP 113/45 | HR 82 | Temp 98.2°F | Ht 61.0 in | Wt 161.1 lb

## 2015-08-17 DIAGNOSIS — Z7982 Long term (current) use of aspirin: Secondary | ICD-10-CM | POA: Insufficient documentation

## 2015-08-17 DIAGNOSIS — Z79899 Other long term (current) drug therapy: Secondary | ICD-10-CM | POA: Diagnosis not present

## 2015-08-17 DIAGNOSIS — Z510 Encounter for antineoplastic radiation therapy: Secondary | ICD-10-CM | POA: Insufficient documentation

## 2015-08-17 DIAGNOSIS — C50411 Malignant neoplasm of upper-outer quadrant of right female breast: Secondary | ICD-10-CM | POA: Insufficient documentation

## 2015-08-17 NOTE — Progress Notes (Signed)
Radiation Oncology         [phone] ________________________________  Name: [name] MRN: [id number]  Date: 08/17/2015  DOB: [birth year]  Re-Evaluation Note  CC: Ashley Dawson, MD  Ashley Bookbinder, MD  No diagnosis found.  Diagnosis:   Stage IA (T1c, Nx, Mx )invasive ductal carcinoma of the upper-outer quadrant of the right female breast, ER (0%), PR (0%), Her2-neu negative.  Narrative:  The patient returns today for a re-evaluation.  She describes a "knot" in her right breast where she had her surgery. She does complain of mild pain over her entire breast area when she moves her arm certain ways. She completed her definitive surgery on January 26. The patient's tumor measured 1.2 cm in size and was poorly differentiated. Surgical margins were clear however posteriorly the margin was close for the invasive and noninvasive tumor. Patient did have reexcision of other margins while in the operating room room. A sentinel node procedure was not performed light of the patient's age.  ALLERGIES:  has No Known Allergies.  Meds: Current Outpatient Prescriptions  Medication Sig Dispense Refill  . aspirin 81 MG chewable tablet Chew 81 mg by mouth daily.      Marland Kitchen atorvastatin (LIPITOR) 10 MG tablet Take 1 tablet (10 mg total) by mouth daily. 90 tablet 3  . Blood Glucose Calibration (OT ULTRA/FASTTK CNTRL SOLN) SOLN Use as instructed 1 each 11  . calcium citrate-vitamin D (CITRACAL+D) 315-200 MG-UNIT tablet Take 1 tablet by mouth 2 (two) times daily.    . Cholecalciferol (VITAMIN D3) 2000 UNITS TABS Take by mouth. Taking 3 times weekly    . Cyanocobalamin (VITAMIN B-12 PO) Take 1 tablet by mouth daily.    Marland Kitchen esomeprazole (NEXIUM) 40 MG capsule TAKE 1 CAPSULE DAILY BEFOREBREAKFAST 90 capsule 3  . furosemide (LASIX) 40 MG tablet TAKE 1 TABLET DAILY FOR    EDEMA (Patient taking differently: 20 mg. TAKE 1 TABLET DAILY FOR    EDEMA) 90 tablet 3  . gabapentin (NEURONTIN) 300 MG capsule  Take 1 capsule (300 mg total) by mouth at bedtime. 30 capsule 3  . glipiZIDE (GLUCOTROL XL) 5 MG 24 hr tablet Take 1 tablet (5 mg total) by mouth daily. 90 tablet 3  . glucose blood (ONE TOUCH ULTRA TEST) test strip Use as instructed; 300 each 5  . glucose blood (ONETOUCH VERIO) test strip Use to test blood sugar twice daily 300 each 3  . Lancets (ONETOUCH ULTRASOFT) lancets Use as instructed 300 each 5  . ONE TOUCH LANCETS MISC Use to test blood sugar twice daily 600 each 3  . vitamin C (ASCORBIC ACID) 500 MG tablet Take 500 mg by mouth daily. Reported on 08/10/2015    . HYDROcodone-acetaminophen (NORCO) 10-325 MG tablet Take 1 tablet by mouth every 6 (six) hours as needed. (Patient not taking: Reported on 08/17/2015) 10 tablet 0   No current facility-administered medications for this encounter.    Physical Findings: The patient is in no acute distress. Patient is alert and oriented.  height is _0  (1.549 m) and weight is 161 lb 1.6 oz (73.074 kg). Her temperature is 98.2 F (36.8 C). Her blood pressure is 113/45 and her pulse is 82. .  No significant changes. The lungs are clear to auscultation. The heart has a regular rhythm and rate. The abdomen is soft and nontender with normal bowel sounds. No palpable subclavicular or axillary adenopathy. No inguinal adenopathy is appreciated. Bruising in the central and medial aspect of the  right breast. Lumpectomy scar in the lateral aspect of the breast at the 10:00 o'clock position. Steri strips in place. She might have a seroma adjacent to her lumpectomy scar.  Lab Findings: Lab Results  Component Value Date   WBC 5.0 07/26/2015   HGB 12.5 07/26/2015   HCT 37.8 07/26/2015   MCV 88.4 07/26/2015   PLT 230 07/26/2015    Radiographic Findings: No results found.  Impression:  [name] is an 82 yo female with invasive ductal carcinoma of the upper-outer quadrant of the right female breast, ER(0%), PR(0%), and Her2-neu negative. She is not a good  candidate for chemotherapy per Dr. Lindi Adie and will not receive hormone therapy because she is ER and PR negative. She is a good candidate for radiation therapy as part of breast conservation therapy  Plan: I spoke to the patient today regarding her diagnosis and options for treatment. We discussed the role of radiation in decreasing local failures in patients who undergo lumpectomy. We discussed the process of simulation and the placement tattoos. We discussed 6-7 weeks of treatment as an outpatient. I doubt the patient is a candidate for hypofractionated treatment in light of her breast size. Patient will receive a boost to the lumpectomy cavity given her close surgical margins.  We discussed the possibility of asymptomatic lung damage. We discussed the low likelihood of secondary malignancies. We discussed the possible side effects including but not limited to skin redness, fatigue, permanent skin darkening, and breast swelling.  She has a follow up appointment with Dr. Donne Hazel 08/24/2015. She will be scheduled for a planning session in about 3 weeks, when she is 5 weeks post-op, with treatment to begin approximately 6 weeks postop.  ____________________________________ -----------------------------------  Blair Promise, PhD, MD    This document serves as a record of services personally performed by Gery Pray, MD. It was created on his behalf by Lendon Collar, a trained medical scribe. The creation of this record is based on the scribe's personal observations and the provider's statements to them. This document has been checked and approved by the attending provider.

## 2015-08-18 ENCOUNTER — Encounter: Payer: Self-pay | Admitting: Genetic Counselor

## 2015-08-18 ENCOUNTER — Telehealth: Payer: Self-pay | Admitting: Genetic Counselor

## 2015-08-18 ENCOUNTER — Ambulatory Visit: Payer: Self-pay | Admitting: Genetic Counselor

## 2015-08-18 DIAGNOSIS — Z800 Family history of malignant neoplasm of digestive organs: Secondary | ICD-10-CM

## 2015-08-18 DIAGNOSIS — Z803 Family history of malignant neoplasm of breast: Secondary | ICD-10-CM

## 2015-08-18 DIAGNOSIS — Z1379 Encounter for other screening for genetic and chromosomal anomalies: Secondary | ICD-10-CM

## 2015-08-18 DIAGNOSIS — C50411 Malignant neoplasm of upper-outer quadrant of right female breast: Secondary | ICD-10-CM

## 2015-08-18 DIAGNOSIS — Z8042 Family history of malignant neoplasm of prostate: Secondary | ICD-10-CM

## 2015-08-18 NOTE — Addendum Note (Signed)
Encounter addended by: Jacqulyn Liner, RN on: 08/18/2015 12:10 PM<BR>     Documentation filed: Charges VN

## 2015-08-18 NOTE — Progress Notes (Signed)
HPI: Ms. Sansone was previously seen in the [place] clinic due to a personal and family history of cancer and concerns regarding a hereditary predisposition to cancer. Please refer to our prior cancer genetics clinic note for more information regarding Ms. Youse's medical, social and family histories, and our assessment and recommendations, at the time. Ms. Partridge recent genetic test results were disclosed to her, as were recommendations warranted by these results. These results and recommendations are discussed in more detail below.  FAMILY HISTORY:  We obtained a detailed, 4-generation family history.  Significant diagnoses are listed below: Family History  Problem Relation Age of Onset  . Colon cancer Neg Hx   . Stomach cancer Neg Hx   . Coronary artery disease Brother     MI in his 66s  . Pancreatic cancer Mother 61  . Diabetes Mother   . Prostate cancer Brother   . Diabetes Father   . Heart disease Father   . Diabetes Brother   . Heart attack Brother   . Diabetes Sister   . Diabetes Daughter   . Diabetes Son     The patient has one son, who passed away in a MVA, and a duaghter who is cancer free. She has two sisters and three brothers. One brother had prostate cancer. One sister and one brother each had a daughter with breast cancer, one diagnosed under 56. The patient's mother was diagnosed with pancreatic cancer at 68. There is no other reported family history of cancer. Patient's maternal ancestors are of Caucasian descent. There is no reported Ashkenazi Jewish ancestry. There is no known consanguinity.  GENETIC TEST RESULTS: At the time of Ms. Lobdell's visit, we recommended she pursue genetic testing of the Breast/Ovarian cancer gene panel. The Breast/Ovarian gene panel offered by [facility] includes sequencing and rearrangement analysis for the following 20 genes:  ATM, BARD1, BRCA1, BRCA2, BRIP1, CDH1, CHEK2, EPCAM, FANCC, MLH1, MSH2, MSH6, NBN, PALB2,  PMS2, PTEN, RAD51C, RAD51D, TP53, and XRCC2.   The report date is August 16, 2015.  Genetic testing was normal, and did not reveal a deleterious mutation in these genes. The test report has been scanned into EPIC and is located under the Molecular Pathology section of the Results Review tab.   We discussed with Ms. Heinrichs that since the current genetic testing is not perfect, it is possible there may be a gene mutation in one of these genes that current testing cannot detect, but that chance is small. We also discussed, that it is possible that another gene that has not yet been discovered, or that we have not yet tested, is responsible for the cancer diagnoses in the family, and it is, therefore, important to remain in touch with cancer genetics in the future so that we can continue to offer Ms. Motl the most up to date genetic testing.   CANCER SCREENING RECOMMENDATIONS: Given Ms. Wenzl's personal and family histories, we must interpret these negative results with some caution.  Families with features suggestive of hereditary risk for cancer tend to have multiple family members with cancer, diagnoses in multiple generations and diagnoses before the age of 42. Ms. Dufrane family exhibits some of these features. Thus this result may simply reflect our current inability to detect all mutations within these genes or there may be a different gene that has not yet been discovered or tested.   As Ms. Guilbert is the oldest in her family to develop cancer, there is the chance that she has  a sporadic form of breast cancer while her family members are affected with cancer because of a hereditary cancer syndrome.  Based on this information, we recommend that her nieces undergo genetic testing, if they have not already done so.  RECOMMENDATIONS FOR FAMILY MEMBERS: Women in this family might be at some increased risk of developing cancer, over the general population risk, simply due to the family history of  cancer. We recommended women in this family have a yearly mammogram beginning at age 55, or 106 years younger than the earliest onset of cancer, an an annual clinical breast exam, and perform monthly breast self-exams. Women in this family should also have a gynecological exam as recommended by their primary provider. All family members should have a colonoscopy by age 36.  Based on Ms. Cavey's family history, we recommended her nieces, who was diagnosed with breast cancer in their 23-50s, have genetic counseling and testing. Ms. Rosamilia will let us know if we can be of any assistance in coordinating genetic counseling and/or testing for this family member.   FOLLOW-UP: Lastly, we discussed with Ms. Laski that cancer genetics is a rapidly advancing field and it is possible that new genetic tests will be appropriate for her and/or her family members in the future. We encouraged her to remain in contact with cancer genetics on an annual basis so we can update her personal and family histories and let her know of advances in cancer genetics that may benefit this family.   Our contact number was provided. Ms. Dufford questions were answered to her satisfaction, and she knows she is welcome to call us at anytime with additional questions or concerns.   Roma Kayser, MS, [facility] Certified Genetic Counselor [email or web address]

## 2015-08-18 NOTE — Telephone Encounter (Signed)
Revealed negative genetic testing.  Discussed that this does not explain why she has breast cancer and why there is breast cancer in the family.  Recommend that her nieces consider genetic testing.  The patient states that she is not in touch with her nieces.

## 2015-08-24 ENCOUNTER — Telehealth: Payer: Self-pay | Admitting: Family Medicine

## 2015-08-24 ENCOUNTER — Other Ambulatory Visit: Payer: Self-pay | Admitting: Family Medicine

## 2015-08-24 ENCOUNTER — Other Ambulatory Visit: Payer: Self-pay | Admitting: Internal Medicine

## 2015-08-24 DIAGNOSIS — E119 Type 2 diabetes mellitus without complications: Secondary | ICD-10-CM

## 2015-08-24 NOTE — Telephone Encounter (Signed)
Pt has been schedule.

## 2015-08-24 NOTE — Telephone Encounter (Signed)
Pt is scheduled on 10/4815 for a follow up.  She needs to be scheduled for lab work before she comes in.  I have placed the order.  Please help her to make a lab appointment.  She does not need to fast.  Thanks!

## 2015-09-05 ENCOUNTER — Ambulatory Visit
Admission: RE | Admit: 2015-09-05 | Discharge: 2015-09-05 | Disposition: A | Discharge status: Home / Self Care | Payer: Medicare Other | Source: Ambulatory Visit | Attending: Radiation Oncology | Admitting: Radiation Oncology

## 2015-09-05 DIAGNOSIS — Z79899 Other long term (current) drug therapy: Secondary | ICD-10-CM | POA: Diagnosis not present

## 2015-09-05 DIAGNOSIS — C50411 Malignant neoplasm of upper-outer quadrant of right female breast: Secondary | ICD-10-CM

## 2015-09-05 DIAGNOSIS — Z510 Encounter for antineoplastic radiation therapy: Secondary | ICD-10-CM | POA: Diagnosis not present

## 2015-09-05 DIAGNOSIS — Z7982 Long term (current) use of aspirin: Secondary | ICD-10-CM | POA: Diagnosis not present

## 2015-09-05 NOTE — Progress Notes (Signed)
  Radiation Oncology         [phone] ________________________________  Name: [name] MRN: [id number]  Date: 09/05/2015  DOB: 12/30/1929  Optical Surface Tracking Plan:  Since intensity modulated radiotherapy (IMRT) and 3D conformal radiation treatment methods are predicated on accurate and precise positioning for treatment, intrafraction motion monitoring is medically necessary to ensure accurate and safe treatment delivery.  The ability to quantify intrafraction motion without excessive ionizing radiation dose can only be performed with optical surface tracking. Accordingly, surface imaging offers the opportunity to obtain 3D measurements of patient position throughout IMRT and 3D treatments without excessive radiation exposure.  I am ordering optical surface tracking for this patient's upcoming course of radiotherapy. ________________________________  Gery Pray, MD 09/05/2015 8:23 PM    Reference:   Particia Jasper, et al. Surface imaging-based analysis of intrafraction motion for breast radiotherapy patients.Journal of [place], n. 6, nov. 2014. ISSN [id number].   Available at: <http://www.jacmp.org/index.php/jacmp/article/view/4957>.

## 2015-09-05 NOTE — Progress Notes (Signed)
  Radiation Oncology         [phone] ________________________________  Name: [name] MRN: [id number]  Date: 09/05/2015  DOB: [birth year]  SIMULATION AND TREATMENT PLANNING NOTE    ICD-9-CM ICD-10-CM   1. 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  NARRATIVE:  The patient was brought to the [facility].  Identity was confirmed.  All relevant records and images related to the planned course of therapy were reviewed.  The patient freely provided informed written consent to proceed with treatment after reviewing the details related to the planned course of therapy. The consent form was witnessed and verified by the simulation staff.  Then, the patient was set-up in a stable reproducible  supine position for radiation therapy.  CT images were obtained.  Surface markings were placed.  The CT images were loaded into the planning software.  Then the target and avoidance structures were contoured.  Treatment planning then occurred.  The radiation prescription was entered and confirmed.  Then, I designed and supervised the construction of a total of 3 medically necessary complex treatment devices.  I have requested : 3D Simulation  I have requested a DVH of the following structures: heart lungs, lumpectomy cavity.  I have ordered:dose calc.  PLAN:  The patient will receive 50.4 Gy in 28 fractions Followed by a boost to the lumpectomy cavity of 12 gray and a cumulative dose of 62.4 gray.  ________________________________  -----------------------------------  Blair Promise, PhD, MD

## 2015-09-07 ENCOUNTER — Other Ambulatory Visit: Payer: Self-pay | Admitting: *Deleted

## 2015-09-07 ENCOUNTER — Telehealth: Payer: Self-pay | Admitting: Hematology and Oncology

## 2015-09-07 NOTE — Telephone Encounter (Signed)
s.w. pt advised on May appt....pt ok and aware

## 2015-09-11 DIAGNOSIS — Z79899 Other long term (current) drug therapy: Secondary | ICD-10-CM | POA: Diagnosis not present

## 2015-09-11 DIAGNOSIS — Z510 Encounter for antineoplastic radiation therapy: Secondary | ICD-10-CM | POA: Diagnosis not present

## 2015-09-11 DIAGNOSIS — C50411 Malignant neoplasm of upper-outer quadrant of right female breast: Secondary | ICD-10-CM | POA: Diagnosis not present

## 2015-09-11 DIAGNOSIS — Z7982 Long term (current) use of aspirin: Secondary | ICD-10-CM | POA: Diagnosis not present

## 2015-09-12 ENCOUNTER — Ambulatory Visit
Admission: RE | Admit: 2015-09-12 | Discharge: 2015-09-12 | Disposition: A | Discharge status: Home / Self Care | Payer: Medicare Other | Source: Ambulatory Visit | Attending: Radiation Oncology | Admitting: Radiation Oncology

## 2015-09-12 DIAGNOSIS — Z7982 Long term (current) use of aspirin: Secondary | ICD-10-CM | POA: Diagnosis not present

## 2015-09-12 DIAGNOSIS — C50411 Malignant neoplasm of upper-outer quadrant of right female breast: Secondary | ICD-10-CM | POA: Diagnosis not present

## 2015-09-12 DIAGNOSIS — Z510 Encounter for antineoplastic radiation therapy: Secondary | ICD-10-CM | POA: Diagnosis not present

## 2015-09-12 DIAGNOSIS — Z79899 Other long term (current) drug therapy: Secondary | ICD-10-CM | POA: Diagnosis not present

## 2015-09-12 NOTE — Progress Notes (Signed)
  Radiation Oncology         [phone] ________________________________  Name: [name] MRN: [id number]  Date: 09/12/2015  DOB: Feb 06, 1930  Simulation Verification Note    ICD-9-CM ICD-10-CM   1. Breast cancer of upper-outer quadrant of right female breast ([place]) 174.4 C50.411     Status: outpatient  NARRATIVE: The patient was brought to the treatment unit and placed in the planned treatment position. The clinical setup was verified. Then port films were obtained and uploaded to the radiation oncology medical record software.  The treatment beams were carefully compared against the planned radiation fields. The position location and shape of the radiation fields was reviewed. They targeted volume of tissue appears to be appropriately covered by the radiation beams. Organs at risk appear to be excluded as planned.  Based on my personal review, I approved the simulation verification. The patient's treatment will proceed as planned.  -----------------------------------  Blair Promise, PhD, MD

## 2015-09-13 ENCOUNTER — Ambulatory Visit
Admission: RE | Admit: 2015-09-13 | Discharge: 2015-09-13 | Disposition: A | Discharge status: Home / Self Care | Payer: Medicare Other | Source: Ambulatory Visit | Attending: Radiation Oncology | Admitting: Radiation Oncology

## 2015-09-13 DIAGNOSIS — C50411 Malignant neoplasm of upper-outer quadrant of right female breast: Secondary | ICD-10-CM | POA: Diagnosis not present

## 2015-09-13 DIAGNOSIS — Z510 Encounter for antineoplastic radiation therapy: Secondary | ICD-10-CM | POA: Diagnosis not present

## 2015-09-13 DIAGNOSIS — Z79899 Other long term (current) drug therapy: Secondary | ICD-10-CM | POA: Diagnosis not present

## 2015-09-13 DIAGNOSIS — Z7982 Long term (current) use of aspirin: Secondary | ICD-10-CM | POA: Diagnosis not present

## 2015-09-14 ENCOUNTER — Ambulatory Visit
Admission: RE | Admit: 2015-09-14 | Discharge: 2015-09-14 | Disposition: A | Discharge status: Home / Self Care | Payer: Medicare Other | Source: Ambulatory Visit | Attending: Radiation Oncology | Admitting: Radiation Oncology

## 2015-09-14 DIAGNOSIS — Z510 Encounter for antineoplastic radiation therapy: Secondary | ICD-10-CM | POA: Diagnosis not present

## 2015-09-14 DIAGNOSIS — Z7982 Long term (current) use of aspirin: Secondary | ICD-10-CM | POA: Diagnosis not present

## 2015-09-14 DIAGNOSIS — C50411 Malignant neoplasm of upper-outer quadrant of right female breast: Secondary | ICD-10-CM | POA: Diagnosis not present

## 2015-09-14 DIAGNOSIS — Z79899 Other long term (current) drug therapy: Secondary | ICD-10-CM | POA: Diagnosis not present

## 2015-09-15 ENCOUNTER — Ambulatory Visit
Admission: RE | Admit: 2015-09-15 | Discharge: 2015-09-15 | Disposition: A | Discharge status: Home / Self Care | Payer: Medicare Other | Source: Ambulatory Visit | Attending: Radiation Oncology | Admitting: Radiation Oncology

## 2015-09-15 DIAGNOSIS — Z510 Encounter for antineoplastic radiation therapy: Secondary | ICD-10-CM | POA: Diagnosis not present

## 2015-09-15 DIAGNOSIS — C50411 Malignant neoplasm of upper-outer quadrant of right female breast: Secondary | ICD-10-CM | POA: Diagnosis not present

## 2015-09-15 DIAGNOSIS — Z7982 Long term (current) use of aspirin: Secondary | ICD-10-CM | POA: Diagnosis not present

## 2015-09-15 DIAGNOSIS — Z79899 Other long term (current) drug therapy: Secondary | ICD-10-CM | POA: Diagnosis not present

## 2015-09-18 ENCOUNTER — Ambulatory Visit
Admission: RE | Admit: 2015-09-18 | Discharge: 2015-09-18 | Disposition: A | Discharge status: Home / Self Care | Payer: Medicare Other | Source: Ambulatory Visit | Attending: Radiation Oncology | Admitting: Radiation Oncology

## 2015-09-18 DIAGNOSIS — Z7982 Long term (current) use of aspirin: Secondary | ICD-10-CM | POA: Diagnosis not present

## 2015-09-18 DIAGNOSIS — C50411 Malignant neoplasm of upper-outer quadrant of right female breast: Secondary | ICD-10-CM | POA: Diagnosis not present

## 2015-09-18 DIAGNOSIS — Z79899 Other long term (current) drug therapy: Secondary | ICD-10-CM | POA: Diagnosis not present

## 2015-09-18 DIAGNOSIS — Z510 Encounter for antineoplastic radiation therapy: Secondary | ICD-10-CM | POA: Diagnosis not present

## 2015-09-19 ENCOUNTER — Ambulatory Visit
Admission: RE | Admit: 2015-09-19 | Discharge: 2015-09-19 | Disposition: A | Discharge status: Home / Self Care | Payer: Medicare Other | Source: Ambulatory Visit | Attending: Radiation Oncology | Admitting: Radiation Oncology

## 2015-09-19 ENCOUNTER — Encounter: Payer: Self-pay | Admitting: Radiation Oncology

## 2015-09-19 VITALS — BP 113/50 | HR 87 | Temp 97.6°F | Ht 61.0 in | Wt 161.9 lb

## 2015-09-19 DIAGNOSIS — Z510 Encounter for antineoplastic radiation therapy: Secondary | ICD-10-CM | POA: Diagnosis not present

## 2015-09-19 DIAGNOSIS — C50411 Malignant neoplasm of upper-outer quadrant of right female breast: Secondary | ICD-10-CM | POA: Diagnosis not present

## 2015-09-19 DIAGNOSIS — Z79899 Other long term (current) drug therapy: Secondary | ICD-10-CM | POA: Diagnosis not present

## 2015-09-19 DIAGNOSIS — Z7982 Long term (current) use of aspirin: Secondary | ICD-10-CM | POA: Diagnosis not present

## 2015-09-19 MED ORDER — RADIAPLEXRX EX GEL
Freq: Once | CUTANEOUS | Status: AC
  Administered 2015-09-19: 12:00:00 via TOPICAL

## 2015-09-19 MED ORDER — ALRA NON-METALLIC DEODORANT (RAD-ONC)
1.0000 "application " | Freq: Once | TOPICAL | Status: AC
  Administered 2015-09-19: 1 via TOPICAL

## 2015-09-19 NOTE — Progress Notes (Signed)
Pt here for patient teaching.  Pt given Radiation and You booklet, skin care instructions, Alra deodorant and Radiaplex gel. Pt reports they have not watched the Radiation Therapy Education video, but were given a link to watch at home.  Reviewed areas of pertinence such as fatigue, skin changes, breast tenderness, breast swelling, cough, shortness of breath, earaches and taste changes . Pt able to give teach back of to pat skin, use unscented/gentle soap and drink plenty of water,apply Radiaplex bid, avoid applying anything to skin within 4 hours of treatment, avoid wearing an under wire bra and to use an electric razor if they must shave. Pt verbalizes understanding of information given and will contact nursing with any questions or concerns.     [email or web address]/[email or web address]

## 2015-09-19 NOTE — Progress Notes (Signed)
  Radiation Oncology         [phone] ________________________________  Name: [name] MRN: [id number]  Date: 09/19/2015  DOB: 05-11-1930  Weekly Radiation Therapy Management    ICD-9-CM ICD-10-CM   1. Breast cancer of upper-outer quadrant of right female breast ([place]) 174.4 C50.411      Current Dose: 13.35 Gy     Planned Dose:  54.72 Gy  Narrative . . . . . . . . The patient presents for routine under treatment assessment.                                   The patient is without complaint. Tolerating treatment well thus far, no fatigue                                 Set-up films were reviewed.                                 The chart was checked. Physical Findings. . .  height is 5\' 1"  (1.549 m) and weight is 161 lb 14.4 oz (73.437 kg). Her temperature is 97.6 F (36.4 C). Her blood pressure is 113/50 and her pulse is 87. . The lungs are clear. The heart has a regular rhythm and rate. The right breast area shows some mild erythema centrally but no significant radiation reaction. Impression . . . . . . . The patient is tolerating radiation. Plan . . . . . . . . . . . . Continue treatment as planned.  ________________________________   Blair Promise, PhD, MD

## 2015-09-19 NOTE — Progress Notes (Signed)
Ms. Ryon is here for her 5th fraction of radiation to her Right Breast. She denies pain or fatigue. She reports no changes to her breast at this time. She was provided with radiaplex cream today, which she will use twice daily.  BP 113/50 mmHg  Pulse 87  Temp(Src) 97.6 F (36.4 C)  Ht 5\' 1"  (1.549 m)  Wt 161 lb 14.4 oz (73.437 kg)  BMI 30.61 kg/m2   Wt Readings from Last 3 Encounters:  09/19/15 161 lb 14.4 oz (73.437 kg)  08/17/15 161 lb 1.6 oz (73.074 kg)  08/10/15 159 lb 6.4 oz (72.303 kg)

## 2015-09-20 ENCOUNTER — Ambulatory Visit
Admission: RE | Admit: 2015-09-20 | Discharge: 2015-09-20 | Disposition: A | Discharge status: Home / Self Care | Payer: Medicare Other | Source: Ambulatory Visit | Attending: Radiation Oncology | Admitting: Radiation Oncology

## 2015-09-20 ENCOUNTER — Telehealth: Payer: Self-pay | Admitting: *Deleted

## 2015-09-20 DIAGNOSIS — Z79899 Other long term (current) drug therapy: Secondary | ICD-10-CM | POA: Diagnosis not present

## 2015-09-20 DIAGNOSIS — C50411 Malignant neoplasm of upper-outer quadrant of right female breast: Secondary | ICD-10-CM | POA: Diagnosis not present

## 2015-09-20 DIAGNOSIS — Z7982 Long term (current) use of aspirin: Secondary | ICD-10-CM | POA: Diagnosis not present

## 2015-09-20 DIAGNOSIS — Z510 Encounter for antineoplastic radiation therapy: Secondary | ICD-10-CM | POA: Diagnosis not present

## 2015-09-20 NOTE — Telephone Encounter (Signed)
Spoke with patient to follow up after start of radiation.  She states she is doing well.  Encouraged her to call with any needs or concerns. 

## 2015-09-21 ENCOUNTER — Ambulatory Visit
Admission: RE | Admit: 2015-09-21 | Discharge: 2015-09-21 | Disposition: A | Discharge status: Home / Self Care | Payer: Medicare Other | Source: Ambulatory Visit | Attending: Radiation Oncology | Admitting: Radiation Oncology

## 2015-09-21 DIAGNOSIS — Z510 Encounter for antineoplastic radiation therapy: Secondary | ICD-10-CM | POA: Diagnosis not present

## 2015-09-21 DIAGNOSIS — Z79899 Other long term (current) drug therapy: Secondary | ICD-10-CM | POA: Diagnosis not present

## 2015-09-21 DIAGNOSIS — Z7982 Long term (current) use of aspirin: Secondary | ICD-10-CM | POA: Diagnosis not present

## 2015-09-21 DIAGNOSIS — C50411 Malignant neoplasm of upper-outer quadrant of right female breast: Secondary | ICD-10-CM | POA: Diagnosis not present

## 2015-09-22 ENCOUNTER — Ambulatory Visit
Admission: RE | Admit: 2015-09-22 | Discharge: 2015-09-22 | Disposition: A | Discharge status: Home / Self Care | Payer: Medicare Other | Source: Ambulatory Visit | Attending: Radiation Oncology | Admitting: Radiation Oncology

## 2015-09-22 DIAGNOSIS — Z510 Encounter for antineoplastic radiation therapy: Secondary | ICD-10-CM | POA: Diagnosis not present

## 2015-09-22 DIAGNOSIS — Z7982 Long term (current) use of aspirin: Secondary | ICD-10-CM | POA: Diagnosis not present

## 2015-09-22 DIAGNOSIS — Z79899 Other long term (current) drug therapy: Secondary | ICD-10-CM | POA: Diagnosis not present

## 2015-09-22 DIAGNOSIS — C50411 Malignant neoplasm of upper-outer quadrant of right female breast: Secondary | ICD-10-CM | POA: Diagnosis not present

## 2015-09-25 ENCOUNTER — Ambulatory Visit
Admission: RE | Admit: 2015-09-25 | Discharge: 2015-09-25 | Disposition: A | Discharge status: Home / Self Care | Payer: Medicare Other | Source: Ambulatory Visit | Attending: Radiation Oncology | Admitting: Radiation Oncology

## 2015-09-25 DIAGNOSIS — Z510 Encounter for antineoplastic radiation therapy: Secondary | ICD-10-CM | POA: Diagnosis not present

## 2015-09-25 DIAGNOSIS — Z7982 Long term (current) use of aspirin: Secondary | ICD-10-CM | POA: Diagnosis not present

## 2015-09-25 DIAGNOSIS — Z79899 Other long term (current) drug therapy: Secondary | ICD-10-CM | POA: Diagnosis not present

## 2015-09-25 DIAGNOSIS — C50411 Malignant neoplasm of upper-outer quadrant of right female breast: Secondary | ICD-10-CM | POA: Diagnosis not present

## 2015-09-26 ENCOUNTER — Encounter: Payer: Self-pay | Admitting: Radiation Oncology

## 2015-09-26 ENCOUNTER — Ambulatory Visit
Admission: RE | Admit: 2015-09-26 | Discharge: 2015-09-26 | Disposition: A | Discharge status: Home / Self Care | Payer: Medicare Other | Source: Ambulatory Visit | Attending: Radiation Oncology | Admitting: Radiation Oncology

## 2015-09-26 VITALS — BP 124/43 | HR 74 | Temp 97.7°F | Ht 61.0 in | Wt 159.8 lb

## 2015-09-26 DIAGNOSIS — C50411 Malignant neoplasm of upper-outer quadrant of right female breast: Secondary | ICD-10-CM | POA: Diagnosis not present

## 2015-09-26 DIAGNOSIS — Z510 Encounter for antineoplastic radiation therapy: Secondary | ICD-10-CM | POA: Diagnosis not present

## 2015-09-26 DIAGNOSIS — Z79899 Other long term (current) drug therapy: Secondary | ICD-10-CM | POA: Diagnosis not present

## 2015-09-26 DIAGNOSIS — Z7982 Long term (current) use of aspirin: Secondary | ICD-10-CM | POA: Diagnosis not present

## 2015-09-26 NOTE — Progress Notes (Addendum)
[name] has completed 10 fractions to her right breast.  She denies having pain or fatigue.  The skin on her right breast is pink which she says fades in the evenings.  She has used radiaplex once.  Advised her to apply it twice a day.  She reports she has 3 raised spots on her neck that are itching.  She is wondering if they are caused by radiation.  BP 124/43 mmHg  Pulse 74  Temp(Src) 97.7 F (36.5 C) (Oral)  Ht 5\' 1"  (1.549 m)  Wt 159 lb 12.8 oz (72.485 kg)  BMI 30.21 kg/m2

## 2015-09-26 NOTE — Progress Notes (Signed)
  Radiation Oncology         [phone] ________________________________  Name: LENNOX KANTOR MRN: [id number]  Date: 09/26/2015  DOB: 1929/08/21  Weekly Radiation Therapy Management    ICD-9-CM ICD-10-CM   1. Breast cancer of upper-outer quadrant of right female breast ([place]) 174.4 C50.411      Current Dose: 26.7 Gy     Planned Dose:  54.72 Gy  Narrative . . . . . . . . The patient presents for routine under treatment assessment.                                  Eather Poepping has completed 10 fractions to her right breast. She denies having pain or fatigue. The skin on her right breast is pink which she says fades in the evenings. She has used radiaplex once. Advised her to apply it twice a day. She reports she has 3 raised spots on her neck that are itching. She is wondering if they are caused by radiation.                                 Set-up films were reviewed.                                 The chart was checked. Physical Findings. . .  height is 5\' 1"  (1.549 m) and weight is 159 lb 12.8 oz (72.485 kg). Her oral temperature is 97.7 F (36.5 C). Her blood pressure is 124/43 and her pulse is 74. . The lungs are clear. The heart has a regular rhythm and rate. The patient has some edema of the right breast and erythema centrally. This appears to be radiation effect and not infection. She also has erythema along the anterior neck region possibly an insect bite the along the right neck region. No signs of infection in this area Impression . . . . . . . The patient is tolerating radiation. Plan . . . . . . . . . . . . Continue treatment as planned. Recommend patient use Benadryl cream or hydrocortisone cream for her itching along the neck. If the rash worsens I recommend she follow-up with her primary care physician. This area is outside of her radiation field.  ________________________________   Blair Promise, PhD, MD

## 2015-09-27 ENCOUNTER — Ambulatory Visit
Admission: RE | Admit: 2015-09-27 | Discharge: 2015-09-27 | Disposition: A | Discharge status: Home / Self Care | Payer: Medicare Other | Source: Ambulatory Visit | Attending: Radiation Oncology | Admitting: Radiation Oncology

## 2015-09-27 DIAGNOSIS — C50411 Malignant neoplasm of upper-outer quadrant of right female breast: Secondary | ICD-10-CM | POA: Diagnosis not present

## 2015-09-27 DIAGNOSIS — Z7982 Long term (current) use of aspirin: Secondary | ICD-10-CM | POA: Diagnosis not present

## 2015-09-27 DIAGNOSIS — Z510 Encounter for antineoplastic radiation therapy: Secondary | ICD-10-CM | POA: Diagnosis not present

## 2015-09-27 DIAGNOSIS — Z79899 Other long term (current) drug therapy: Secondary | ICD-10-CM | POA: Diagnosis not present

## 2015-09-28 ENCOUNTER — Ambulatory Visit
Admission: RE | Admit: 2015-09-28 | Discharge: 2015-09-28 | Disposition: A | Discharge status: Home / Self Care | Payer: Medicare Other | Source: Ambulatory Visit | Attending: Radiation Oncology | Admitting: Radiation Oncology

## 2015-09-28 DIAGNOSIS — Z510 Encounter for antineoplastic radiation therapy: Secondary | ICD-10-CM | POA: Diagnosis not present

## 2015-09-28 DIAGNOSIS — Z7982 Long term (current) use of aspirin: Secondary | ICD-10-CM | POA: Diagnosis not present

## 2015-09-28 DIAGNOSIS — Z79899 Other long term (current) drug therapy: Secondary | ICD-10-CM | POA: Diagnosis not present

## 2015-09-28 DIAGNOSIS — C50411 Malignant neoplasm of upper-outer quadrant of right female breast: Secondary | ICD-10-CM | POA: Diagnosis not present

## 2015-09-29 ENCOUNTER — Ambulatory Visit
Admission: RE | Admit: 2015-09-29 | Discharge: 2015-09-29 | Disposition: A | Discharge status: Home / Self Care | Payer: Medicare Other | Source: Ambulatory Visit | Attending: Radiation Oncology | Admitting: Radiation Oncology

## 2015-09-29 DIAGNOSIS — C50411 Malignant neoplasm of upper-outer quadrant of right female breast: Secondary | ICD-10-CM | POA: Diagnosis not present

## 2015-09-29 DIAGNOSIS — Z510 Encounter for antineoplastic radiation therapy: Secondary | ICD-10-CM | POA: Diagnosis not present

## 2015-09-29 DIAGNOSIS — Z7982 Long term (current) use of aspirin: Secondary | ICD-10-CM | POA: Diagnosis not present

## 2015-09-29 DIAGNOSIS — Z79899 Other long term (current) drug therapy: Secondary | ICD-10-CM | POA: Diagnosis not present

## 2015-10-02 ENCOUNTER — Ambulatory Visit
Admission: RE | Admit: 2015-10-02 | Discharge: 2015-10-02 | Disposition: A | Discharge status: Home / Self Care | Payer: Medicare Other | Source: Ambulatory Visit | Attending: Radiation Oncology | Admitting: Radiation Oncology

## 2015-10-02 DIAGNOSIS — Z510 Encounter for antineoplastic radiation therapy: Secondary | ICD-10-CM | POA: Diagnosis not present

## 2015-10-02 DIAGNOSIS — C50411 Malignant neoplasm of upper-outer quadrant of right female breast: Secondary | ICD-10-CM | POA: Diagnosis not present

## 2015-10-02 DIAGNOSIS — Z7982 Long term (current) use of aspirin: Secondary | ICD-10-CM | POA: Diagnosis not present

## 2015-10-02 DIAGNOSIS — Z79899 Other long term (current) drug therapy: Secondary | ICD-10-CM | POA: Diagnosis not present

## 2015-10-03 ENCOUNTER — Ambulatory Visit
Admission: RE | Admit: 2015-10-03 | Discharge: 2015-10-03 | Disposition: A | Discharge status: Home / Self Care | Payer: Medicare Other | Source: Ambulatory Visit | Attending: Radiation Oncology | Admitting: Radiation Oncology

## 2015-10-03 ENCOUNTER — Ambulatory Visit: Payer: Medicare Other | Admitting: Radiation Oncology

## 2015-10-03 ENCOUNTER — Inpatient Hospital Stay: Admission: RE | Admit: 2015-10-03 | Payer: Self-pay | Source: Ambulatory Visit | Admitting: Radiation Oncology

## 2015-10-03 ENCOUNTER — Other Ambulatory Visit: Payer: Medicare Other

## 2015-10-03 ENCOUNTER — Encounter: Payer: Self-pay | Admitting: Radiation Oncology

## 2015-10-03 ENCOUNTER — Other Ambulatory Visit (INDEPENDENT_AMBULATORY_CARE_PROVIDER_SITE_OTHER): Payer: Medicare Other

## 2015-10-03 VITALS — BP 118/54 | HR 88 | Temp 97.9°F | Resp 18 | Ht 61.0 in | Wt 160.0 lb

## 2015-10-03 DIAGNOSIS — Z510 Encounter for antineoplastic radiation therapy: Secondary | ICD-10-CM | POA: Diagnosis not present

## 2015-10-03 DIAGNOSIS — E119 Type 2 diabetes mellitus without complications: Secondary | ICD-10-CM | POA: Diagnosis not present

## 2015-10-03 DIAGNOSIS — Z7982 Long term (current) use of aspirin: Secondary | ICD-10-CM | POA: Diagnosis not present

## 2015-10-03 DIAGNOSIS — C50411 Malignant neoplasm of upper-outer quadrant of right female breast: Secondary | ICD-10-CM | POA: Diagnosis not present

## 2015-10-03 DIAGNOSIS — Z79899 Other long term (current) drug therapy: Secondary | ICD-10-CM | POA: Diagnosis not present

## 2015-10-03 LAB — HEMOGLOBIN A1C: Hgb A1c MFr Bld: 7.7 % — ABNORMAL HIGH (ref 4.6–6.5)

## 2015-10-03 NOTE — Progress Notes (Signed)
[name] has completed 15 fractions to her right breast.  She reports having a pulling feeling in her right breast when she raised her right arm for treatment.  She denies having fatigue.  She is using radiaplex.  The skin on her right breast is red with dermatitis on the upper portion of her chest.  She reports having itching.  Advised her to try hydrocortisone cream.  BP 118/54 mmHg  Pulse 88  Temp(Src) 97.9 F (36.6 C) (Oral)  Resp 18  Ht 5\' 1"  (1.549 m)  Wt 160 lb (72.576 kg)  BMI 30.25 kg/m2

## 2015-10-03 NOTE — Progress Notes (Signed)
  Radiation Oncology         [phone] ________________________________  Name: [name] MRN: [id number]  Date: 10/03/2015  DOB: [birth year]  Weekly Radiation Therapy Management    ICD-9-CM ICD-10-CM   1. Breast cancer of upper-outer quadrant of right female breast ([place]) 174.4 C50.411      Current Dose: 40.05 Gy     Planned Dose:  54.72 Gy  Narrative . . . . . . . . The patient presents for routine under treatment assessment.                                  [name] has completed 15 fractions to her right breast. She reports having a pulling feeling in her right breast when she raised her right arm for treatment. She denies having fatigue. She is using radiaplex.  She reports having itching. Advised her to try hydrocortisone cream.                                 Set-up films were reviewed.                                 The chart was checked. Physical Findings. . .  height is 5\' 1"  (1.549 m) and weight is 160 lb (72.576 kg). Her oral temperature is 97.9 F (36.6 C). Her blood pressure is 118/54 and her pulse is 88. Her respiration is 18. . Weight essentially stable.  No significant changes. The skin on her right breast is red with dermatitis on the upper portion of her chest. The lungs are clear. The heart has a regular rhythm and rate Impression . . . . . . . The patient is tolerating radiation. Plan . . . . . . . . . . . . Continue treatment as planned.  ________________________________   Blair Promise, PhD, MD

## 2015-10-04 ENCOUNTER — Encounter: Payer: Self-pay | Admitting: Radiation Oncology

## 2015-10-04 ENCOUNTER — Ambulatory Visit
Admission: RE | Admit: 2015-10-04 | Discharge: 2015-10-04 | Disposition: A | Discharge status: Home / Self Care | Payer: Medicare Other | Source: Ambulatory Visit | Attending: Radiation Oncology | Admitting: Radiation Oncology

## 2015-10-04 DIAGNOSIS — Z510 Encounter for antineoplastic radiation therapy: Secondary | ICD-10-CM | POA: Diagnosis not present

## 2015-10-04 DIAGNOSIS — C50411 Malignant neoplasm of upper-outer quadrant of right female breast: Secondary | ICD-10-CM

## 2015-10-04 DIAGNOSIS — Z79899 Other long term (current) drug therapy: Secondary | ICD-10-CM | POA: Diagnosis not present

## 2015-10-04 DIAGNOSIS — Z7982 Long term (current) use of aspirin: Secondary | ICD-10-CM | POA: Diagnosis not present

## 2015-10-04 NOTE — Progress Notes (Signed)
  Radiation Oncology         [phone] ________________________________  Name: [name] MRN: [id number]  Date: 10/04/2015  DOB: 08/04/1929  Simulation  Note    ICD-9-CM ICD-10-CM   1. Breast cancer of upper-outer quadrant of right female breast ([place]) 174.4 C50.411     Status: outpatient  NARRATIVE:Earlier today the patient underwent additional planning for radiation therapy directed at the right breast area. Patient's treatment planning CT scan was reviewed and she had set up of a field directed at the lumpectomy cavity. The patient will be treated with a 3 field photon set up given the depth within the breast area. 3 custom fields were developed for the patient's treatment. A computerized isodose plan will be generated for treatment. Combination of 6 and 10 megavoltage photons were used to deliver the patient's treatment. Patient will receive 6 treatments at 2 gray per fraction for a boost dose of 12 gray -----------------------------------  Blair Promise, PhD, MD

## 2015-10-05 ENCOUNTER — Ambulatory Visit
Admission: RE | Admit: 2015-10-05 | Discharge: 2015-10-05 | Disposition: A | Discharge status: Home / Self Care | Payer: Medicare Other | Source: Ambulatory Visit | Attending: Radiation Oncology | Admitting: Radiation Oncology

## 2015-10-05 DIAGNOSIS — Z79899 Other long term (current) drug therapy: Secondary | ICD-10-CM | POA: Diagnosis not present

## 2015-10-05 DIAGNOSIS — Z7982 Long term (current) use of aspirin: Secondary | ICD-10-CM | POA: Diagnosis not present

## 2015-10-05 DIAGNOSIS — Z510 Encounter for antineoplastic radiation therapy: Secondary | ICD-10-CM | POA: Diagnosis not present

## 2015-10-05 DIAGNOSIS — C50411 Malignant neoplasm of upper-outer quadrant of right female breast: Secondary | ICD-10-CM | POA: Diagnosis not present

## 2015-10-06 ENCOUNTER — Ambulatory Visit
Admission: RE | Admit: 2015-10-06 | Discharge: 2015-10-06 | Disposition: A | Discharge status: Home / Self Care | Payer: Medicare Other | Source: Ambulatory Visit | Attending: Radiation Oncology | Admitting: Radiation Oncology

## 2015-10-06 DIAGNOSIS — Z7982 Long term (current) use of aspirin: Secondary | ICD-10-CM | POA: Diagnosis not present

## 2015-10-06 DIAGNOSIS — Z510 Encounter for antineoplastic radiation therapy: Secondary | ICD-10-CM | POA: Diagnosis not present

## 2015-10-06 DIAGNOSIS — Z79899 Other long term (current) drug therapy: Secondary | ICD-10-CM | POA: Diagnosis not present

## 2015-10-06 DIAGNOSIS — C50411 Malignant neoplasm of upper-outer quadrant of right female breast: Secondary | ICD-10-CM | POA: Diagnosis not present

## 2015-10-09 ENCOUNTER — Ambulatory Visit
Admission: RE | Admit: 2015-10-09 | Discharge: 2015-10-09 | Disposition: A | Discharge status: Home / Self Care | Payer: Medicare Other | Source: Ambulatory Visit | Attending: Radiation Oncology | Admitting: Radiation Oncology

## 2015-10-09 DIAGNOSIS — C50411 Malignant neoplasm of upper-outer quadrant of right female breast: Secondary | ICD-10-CM | POA: Diagnosis not present

## 2015-10-09 DIAGNOSIS — Z79899 Other long term (current) drug therapy: Secondary | ICD-10-CM | POA: Diagnosis not present

## 2015-10-09 DIAGNOSIS — Z7982 Long term (current) use of aspirin: Secondary | ICD-10-CM | POA: Diagnosis not present

## 2015-10-09 DIAGNOSIS — Z510 Encounter for antineoplastic radiation therapy: Secondary | ICD-10-CM | POA: Diagnosis not present

## 2015-10-09 NOTE — Progress Notes (Signed)
Chief Complaint  Patient presents with  . 6 Month Follow Up    HPI: [name] 82 y.o.  Comes in for fu Chronic disease management since lasst visit dx with breast cancer and rx wti h lumpectomy and   Finishing up radiation rx to r breast  DM  Not checked   Sugars but no lows  Eating some more  No new meds  stevia .   Sweet tea sometime.    Pre bought  Breast cancer dx  Recent  No exercise  Vision no change  No numbness other changes   ROS: See pertinent positives and negatives per HPI.  Past Medical History  Diagnosis Date  . Arthritis   . Diabetes mellitus   . Colon polyps     adenomatous  . Diverticulosis   . Hemorrhoids   . Dyslipidemia   . GERD (gastroesophageal reflux disease)   . Renal insufficiency   . DDD (degenerative disc disease)   . Shingles   . Breast cancer of upper-outer quadrant of right female breast ([place]) 07/21/2015  . Breast cancer ([place])   . Peripheral edema     ankles, takes lasix  . PONV (postoperative nausea and vomiting)   . Family history of breast cancer   . Family history of prostate cancer   . Family history of pancreatic cancer     Family History  Problem Relation Age of Onset  . Colon cancer Neg Hx   . Stomach cancer Neg Hx   . Coronary artery disease Brother     MI in his 80s  . Pancreatic cancer Mother 37  . Diabetes Mother   . Prostate cancer Brother   . Diabetes Father   . Heart disease Father   . Diabetes Brother   . Heart attack Brother   . Diabetes Sister   . Diabetes Daughter   . Diabetes Son     Social History   Social History  . Marital Status: Married    Spouse Name: N/A  . Number of Children: 2  . Years of Education: N/A   Occupational History  . Retired    Social History Main Topics  . Smoking status: Never Smoker   . Smokeless tobacco: Never Used  . Alcohol Use: No  . Drug Use: No  . Sexual Activity: No   Other Topics Concern  . Not on file   Social History Narrative   HH 2 married   Works for [employer] for 40 years retired   No pets     Outpatient Prescriptions Prior to Visit  Medication Sig Dispense Refill  . aspirin 81 MG chewable tablet Chew 81 mg by mouth daily.      Marland Kitchen atorvastatin (LIPITOR) 10 MG tablet Take 1 tablet (10 mg total) by mouth daily. 90 tablet 3  . Blood Glucose Calibration (OT ULTRA/FASTTK CNTRL SOLN) SOLN Use as instructed 1 each 11  . calcium citrate-vitamin D (CITRACAL+D) 315-200 MG-UNIT tablet Take 1 tablet by mouth 2 (two) times daily.    . Cholecalciferol (VITAMIN D3) 2000 UNITS TABS Take by mouth. Taking 3 times weekly    . Cyanocobalamin (VITAMIN B-12 PO) Take 1 tablet by mouth daily.    Marland Kitchen esomeprazole (NEXIUM) 40 MG capsule TAKE 1 CAPSULE DAILY BEFOREBREAKFAST (Patient taking differently: TAKE 1 CAPSULE every other day.) 90 capsule 3  . furosemide (LASIX) 40 MG tablet TAKE 1 TABLET DAILY FOR    EDEMA (Patient taking differently: Take 20 mg by mouth daily. )  90 tablet 3  . gabapentin (NEURONTIN) 300 MG capsule TAKE 1 CAPSULE(300 MG) BY MOUTH AT BEDTIME 30 capsule 5  . glipiZIDE (GLUCOTROL XL) 5 MG 24 hr tablet Take 1 tablet (5 mg total) by mouth daily. 90 tablet 3  . glucose blood (ONETOUCH VERIO) test strip Use to test blood sugar twice daily 300 each 3  . hyaluronate sodium (RADIAPLEXRX) GEL Apply 1 application topically once.    . ONE TOUCH LANCETS MISC Use to test blood sugar twice daily 600 each 3  . vitamin C (ASCORBIC ACID) 500 MG tablet Take 500 mg by mouth daily. Takes one tablet as needed.    . Lancets (ONETOUCH ULTRASOFT) lancets Use as instructed 300 each 5  . glucose blood (ONE TOUCH ULTRA TEST) test strip Use as instructed; 300 each 5   No facility-administered medications prior to visit.     EXAM:  BP 132/72 mmHg  Pulse 102  Temp(Src) 97.9 F (36.6 C) (Oral)  Ht 5\' 1"  (1.549 m)  Wt 159 lb 14.4 oz (72.53 kg)  BMI 30.23 kg/m2  SpO2 96%  Body mass index is 30.23 kg/(m^2).  GENERAL: vitals reviewed and listed above,  alert, oriented, appears well hydrated and in no acute distress HEENT: atraumatic, conjunctiva  clear, no obvious abnormalities on inspection of external nose and ears   NECK: no obvious masses on inspection palpation  Right breats area pink with dermatitis mild r LUNGS: clear to auscultation bilaterally, no wheezes, rales or rhonchi, good air movement CV: HRRR, no clubbing cyanosis or  peripheral edema nl cap refill  MS: moves all extremities without noticeable focal  Abnormality feet  No lesion nl pulse  PSYCH: pleasant and cooperative, no obvious depression or anxiety Lab Results  Component Value Date   WBC 5.0 07/26/2015   HGB 12.5 07/26/2015   HCT 37.8 07/26/2015   PLT 230 07/26/2015   GLUCOSE 140 07/26/2015   CHOL 170 05/01/2015   TRIG 242.0* 05/01/2015   HDL 36.20* 05/01/2015   LDLDIRECT 85.0 05/01/2015   LDLCALC 83 12/16/2013   ALT 13 07/26/2015   AST 14 07/26/2015   NA 141 07/26/2015   K 4.1 07/26/2015   CL 104 05/01/2015   CREATININE 1.5* 07/26/2015   BUN 32.3* 07/26/2015   CO2 29 07/26/2015   TSH 2.06 05/01/2015   INR 1.0 02/15/2009   HGBA1C 7.7* 10/03/2015   MICROALBUR <0.2 12/27/2014   Diabetes Health Maintenance Due  Topic Date Due  . URINE MICROALBUMIN  12/27/2015  . OPHTHALMOLOGY EXAM  03/15/2016  . HEMOGLOBIN A1C  04/04/2016  . FOOT EXAM  04/30/2016    ASSESSMENT AND PLAN:  Discussed the following assessment and plan:  Type 2 diabetes mellitus without complication, without long-term current use of insulin ([facility]) - 7.7 intesnify lsi no sugar drinks  below 8 acceptable but check 3 months to ensure not rising  pt aware   Renal insufficiency  Wears hearing aid  Malignant neoplasm of right female breast, unspecified site of breast ([place]) - in active radiation rx   -Patient advised to return or notify health care team  if symptoms worsen ,persist or new concerns arise.  Patient Instructions  Stop the sugar in the sweet tea .     To seen   can help and  pay attention.  No change in med at this time. Plan  HG a1c in 3 months and then rov  To make sure not  Getting out of control .  Let us know  if you want [place] to arrange a nutrition referral .      Standley Brooking. Adelma Bowdoin M.D.

## 2015-10-10 ENCOUNTER — Ambulatory Visit
Admission: RE | Admit: 2015-10-10 | Discharge: 2015-10-10 | Disposition: A | Discharge status: Home / Self Care | Payer: Medicare Other | Source: Ambulatory Visit | Attending: Radiation Oncology | Admitting: Radiation Oncology

## 2015-10-10 ENCOUNTER — Ambulatory Visit (INDEPENDENT_AMBULATORY_CARE_PROVIDER_SITE_OTHER): Payer: Medicare Other | Admitting: Internal Medicine

## 2015-10-10 ENCOUNTER — Encounter: Payer: Self-pay | Admitting: Radiation Oncology

## 2015-10-10 ENCOUNTER — Encounter: Payer: Self-pay | Admitting: Internal Medicine

## 2015-10-10 ENCOUNTER — Ambulatory Visit: Payer: Medicare Other | Admitting: Internal Medicine

## 2015-10-10 VITALS — BP 132/72 | HR 88 | Temp 97.9°F | Ht 61.0 in | Wt 159.9 lb

## 2015-10-10 VITALS — BP 129/51 | HR 93 | Temp 98.1°F | Resp 16 | Ht 61.0 in | Wt 159.7 lb

## 2015-10-10 DIAGNOSIS — Z974 Presence of external hearing-aid: Secondary | ICD-10-CM

## 2015-10-10 DIAGNOSIS — E119 Type 2 diabetes mellitus without complications: Secondary | ICD-10-CM | POA: Diagnosis not present

## 2015-10-10 DIAGNOSIS — C50911 Malignant neoplasm of unspecified site of right female breast: Secondary | ICD-10-CM | POA: Diagnosis not present

## 2015-10-10 DIAGNOSIS — Z923 Personal history of irradiation: Secondary | ICD-10-CM | POA: Insufficient documentation

## 2015-10-10 DIAGNOSIS — R54 Age-related physical debility: Secondary | ICD-10-CM

## 2015-10-10 DIAGNOSIS — C50411 Malignant neoplasm of upper-outer quadrant of right female breast: Secondary | ICD-10-CM | POA: Insufficient documentation

## 2015-10-10 DIAGNOSIS — Z7982 Long term (current) use of aspirin: Secondary | ICD-10-CM | POA: Diagnosis not present

## 2015-10-10 DIAGNOSIS — Z510 Encounter for antineoplastic radiation therapy: Secondary | ICD-10-CM | POA: Diagnosis not present

## 2015-10-10 DIAGNOSIS — IMO0001 Reserved for inherently not codable concepts without codable children: Secondary | ICD-10-CM

## 2015-10-10 DIAGNOSIS — N289 Disorder of kidney and ureter, unspecified: Secondary | ICD-10-CM

## 2015-10-10 DIAGNOSIS — Z79899 Other long term (current) drug therapy: Secondary | ICD-10-CM | POA: Diagnosis not present

## 2015-10-10 DIAGNOSIS — L598 Other specified disorders of the skin and subcutaneous tissue related to radiation: Secondary | ICD-10-CM | POA: Insufficient documentation

## 2015-10-10 MED ORDER — RADIAPLEXRX EX GEL
Freq: Once | CUTANEOUS | Status: AC
  Administered 2015-10-10: 11:00:00 via TOPICAL

## 2015-10-10 NOTE — Progress Notes (Signed)
Pre visit review using our clinic review tool, if applicable. No additional management support is needed unless otherwise documented below in the visit note. 

## 2015-10-10 NOTE — Patient Instructions (Signed)
Stop the sugar in the sweet tea .     To seen   can help and pay attention.  No change in med at this time. Plan  HG a1c in 3 months and then rov  To make sure not  Getting out of control .  Let us know if you want [place] to arrange a nutrition referral .

## 2015-10-10 NOTE — Addendum Note (Signed)
Encounter addended by: Jacqulyn Liner, RN on: 10/10/2015 10:35 AM<BR>     Documentation filed: Inpatient MAR

## 2015-10-10 NOTE — Progress Notes (Signed)
  Radiation Oncology         [phone] ________________________________  Name: KEYDRA OSORNO MRN: [id number]  Date: 10/10/2015  DOB: Aug 25, 1929  Weekly Radiation Therapy Management    ICD-9-CM ICD-10-CM   1. Breast cancer of upper-outer quadrant of right female breast ([facility]) 174.4 C50.411 hyaluronate sodium (RADIAPLEXRX) gel     Current Dose: 50.72 Gy     Planned Dose:  54.72 Gy  Narrative . . . . . . . . The patient presents for routine under treatment assessment.                                                                    Set-up films were reviewed.                                 The chart was reviewed.                                   Aryonna Verderber has completed 20 fractions to her right breast. She denies having pain and fatigue. She is using radiplex gel and has been given a refill.. She reports having itching on her right breast. She has been given a one month follow up appointment.   Physical Findings. . .  height is 5\' 1"  (1.549 m) and weight is 159 lb 11.2 oz (72.439 kg). Her oral temperature is 98.1 F (36.7 C). Her blood pressure is 129/51 and her pulse is 93. Her respiration is 16. . Weight essentially stable.  No significant changes. The right breast area shows radiation dermatitis  in the upper-inner aspect. No moist desquamation Impression . . . . . . . The patient is tolerating radiation. Plan . . . . . . . . . . . . Continue treatment as planned.  Advised to use hydrocortisone cream if itching gets significant.  ________________________________   Blair Promise, PhD, MD

## 2015-10-10 NOTE — Progress Notes (Signed)
[name] has completed 20 fractions to her right breast.  She denies having pain and fatigue.  She is using radiplex gel and has been given a refill..  She reports having itching on her right breast.  She has been given a one month follow up appointment.  The skin on her right breast is red with dermatitis on the upper portion.  BP 129/51 mmHg  Pulse 93  Temp(Src) 98.1 F (36.7 C) (Oral)  Resp 16  Ht 5\' 1"  (1.549 m)  Wt 159 lb 11.2 oz (72.439 kg)  BMI 30.19 kg/m2

## 2015-10-11 ENCOUNTER — Ambulatory Visit
Admission: RE | Admit: 2015-10-11 | Discharge: 2015-10-11 | Disposition: A | Discharge status: Home / Self Care | Payer: Medicare Other | Source: Ambulatory Visit | Attending: Radiation Oncology | Admitting: Radiation Oncology

## 2015-10-11 DIAGNOSIS — C50411 Malignant neoplasm of upper-outer quadrant of right female breast: Secondary | ICD-10-CM | POA: Diagnosis not present

## 2015-10-11 DIAGNOSIS — Z79899 Other long term (current) drug therapy: Secondary | ICD-10-CM | POA: Diagnosis not present

## 2015-10-11 DIAGNOSIS — Z510 Encounter for antineoplastic radiation therapy: Secondary | ICD-10-CM | POA: Diagnosis not present

## 2015-10-11 DIAGNOSIS — Z7982 Long term (current) use of aspirin: Secondary | ICD-10-CM | POA: Diagnosis not present

## 2015-10-12 ENCOUNTER — Ambulatory Visit
Admission: RE | Admit: 2015-10-12 | Discharge: 2015-10-12 | Disposition: A | Discharge status: Home / Self Care | Payer: Medicare Other | Source: Ambulatory Visit | Attending: Radiation Oncology | Admitting: Radiation Oncology

## 2015-10-12 ENCOUNTER — Ambulatory Visit: Payer: Medicare Other

## 2015-10-12 ENCOUNTER — Encounter: Payer: Self-pay | Admitting: Radiation Oncology

## 2015-10-12 DIAGNOSIS — Z79899 Other long term (current) drug therapy: Secondary | ICD-10-CM | POA: Diagnosis not present

## 2015-10-12 DIAGNOSIS — Z510 Encounter for antineoplastic radiation therapy: Secondary | ICD-10-CM | POA: Diagnosis not present

## 2015-10-12 DIAGNOSIS — Z7982 Long term (current) use of aspirin: Secondary | ICD-10-CM | POA: Diagnosis not present

## 2015-10-12 DIAGNOSIS — C50411 Malignant neoplasm of upper-outer quadrant of right female breast: Secondary | ICD-10-CM | POA: Diagnosis not present

## 2015-10-13 ENCOUNTER — Ambulatory Visit: Payer: Medicare Other

## 2015-10-16 ENCOUNTER — Telehealth: Payer: Self-pay | Admitting: Oncology

## 2015-10-16 ENCOUNTER — Ambulatory Visit: Payer: Medicare Other

## 2015-10-16 NOTE — Telephone Encounter (Signed)
[facility] and verified with her that her follow up with Dr. Sondra Come will be on 11/30/15.  She verbalized agreement and understanding.

## 2015-10-17 ENCOUNTER — Ambulatory Visit: Payer: Medicare Other

## 2015-10-17 ENCOUNTER — Ambulatory Visit: Payer: Medicare Other | Admitting: Radiation Oncology

## 2015-10-18 ENCOUNTER — Ambulatory Visit: Payer: Medicare Other

## 2015-10-19 ENCOUNTER — Ambulatory Visit: Payer: Medicare Other

## 2015-10-20 ENCOUNTER — Other Ambulatory Visit: Payer: Self-pay | Admitting: Adult Health

## 2015-10-20 ENCOUNTER — Ambulatory Visit: Payer: Medicare Other

## 2015-10-20 DIAGNOSIS — C50411 Malignant neoplasm of upper-outer quadrant of right female breast: Secondary | ICD-10-CM

## 2015-10-23 ENCOUNTER — Ambulatory Visit: Payer: Medicare Other

## 2015-10-24 ENCOUNTER — Ambulatory Visit: Payer: Medicare Other

## 2015-10-25 ENCOUNTER — Ambulatory Visit
Admission: RE | Admit: 2015-10-25 | Discharge: 2015-10-25 | Disposition: A | Discharge status: Home / Self Care | Payer: Medicare Other | Source: Ambulatory Visit | Attending: Radiation Oncology | Admitting: Radiation Oncology

## 2015-10-26 ENCOUNTER — Ambulatory Visit: Payer: Medicare Other

## 2015-10-27 ENCOUNTER — Ambulatory Visit: Payer: Medicare Other

## 2015-10-28 NOTE — Progress Notes (Signed)
Radiation Oncology         [phone] ________________________________  Name: [name] MRN: [id number]  Date: 10/12/2015  DOB: 26-Jan-1930  End of Treatment Note   ICD-9-CM ICD-10-CM    1. 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     Indication for treatment:  Breast conservation       Radiation treatment dates:   09/13/2015-10/12/2015  Site/dose:   42.72 gray in 16 fractions directed right breast with a boost of 12 gray in 6 fractions directed at the lumpectomy cavity  Beams/energy:   Primary: 3D-Conformal Other/ 6X Photon  Boost: 3 Field/ 10X, 6X Photon  Narrative: The patient tolerated radiation treatment relatively well.   No significant fatigue or pain. She did report some itching within the right breast. No moist desquamation.  Plan: The patient has completed radiation treatment. The patient will return to radiation oncology clinic for routine followup in one month. I advised them to call or return sooner if they have any questions or concerns related to their recovery or treatment.  -----------------------------------  Blair Promise, PhD, MD

## 2015-10-30 ENCOUNTER — Ambulatory Visit: Payer: Medicare Other

## 2015-10-30 ENCOUNTER — Telehealth: Payer: Self-pay | Admitting: *Deleted

## 2015-10-30 NOTE — Telephone Encounter (Signed)
Called pt to congratulate on completion of xrt. Relate doing well and without complaints. Discussed survivorship program and referral. Encourage pt to call with needs or questions. Received verbal understanding.

## 2015-11-03 ENCOUNTER — Encounter: Payer: Self-pay | Admitting: Radiation Oncology

## 2015-11-09 ENCOUNTER — Telehealth: Payer: Self-pay | Admitting: Hematology and Oncology

## 2015-11-09 ENCOUNTER — Encounter: Payer: Self-pay | Admitting: Hematology and Oncology

## 2015-11-09 ENCOUNTER — Ambulatory Visit (HOSPITAL_BASED_OUTPATIENT_CLINIC_OR_DEPARTMENT_OTHER): Payer: Medicare Other | Admitting: Hematology and Oncology

## 2015-11-09 VITALS — BP 134/49 | HR 75 | Temp 97.7°F | Resp 18 | Ht 61.0 in | Wt 162.5 lb

## 2015-11-09 DIAGNOSIS — C50411 Malignant neoplasm of upper-outer quadrant of right female breast: Secondary | ICD-10-CM

## 2015-11-09 DIAGNOSIS — Z853 Personal history of malignant neoplasm of breast: Secondary | ICD-10-CM

## 2015-11-09 NOTE — Telephone Encounter (Signed)
appt made and avs printed °

## 2015-11-09 NOTE — Progress Notes (Signed)
Patient Care Team: Burnis Medin, MD as PCP - General (Internal Medicine) Monna Fam, MD (Ophthalmology) Sylvan Cheese, NP as Nurse Practitioner (Hematology and Oncology)  DIAGNOSIS: Breast cancer of upper-outer quadrant of right female breast [facility])   Staging form: Breast, AJCC 7th Edition     Clinical stage from 07/26/2015: Stage IA (T1a, N0, M0) - Unsigned       Staging comments: Staged at breast conference 1.18.17  SUMMARY OF ONCOLOGIC HISTORY:   Breast cancer of upper-outer quadrant of right female breast ([place])   07/19/2015 Initial Diagnosis Right breast biopsy: Invasive ductal carcinoma grade 2-3, PR 0%, PR 0%, Ki-67 20%, HER-2 negative ratio 1.77; 5 mm abnormality at 10:00 position, T1 BN 0 stage I a clinical stage   08/03/2015 Surgery Rt. Lumpectomy: 1.2 cm, Grade 3, with DCIS (in situ margin 0.1 cm), LN not taken, Er 0%, PR 0%, Her 2 Neg ratio: 1.77 T1bNx (stage 1A)   09/13/2015 - 10/12/2015 Radiation Therapy Adjuvant radiation therapy with Dr. Sondra Barges    CHIEF COMPLIANT: follow-up of her radiation therapy  INTERVAL HISTORY: JOSY PEADEN is a 82 year old with above-mentioned history of right breast IDC status post lumpectomy and radiation, final pathology only revealed DCIS. She finished radiation therapy and is here to discuss follow-up. Because she is ER/PR negative she did not need antiestrogen treatment. She is still sore from the recent radiation and feels of the right breast is slightly swollen.  REVIEW OF SYSTEMS:   Constitutional: Denies fevers, chills or abnormal weight loss Eyes: Denies blurriness of vision Ears, nose, mouth, throat, and face: Denies mucositis or sore throat Respiratory: Denies cough, dyspnea or wheezes Cardiovascular: Denies palpitation, chest discomfort Gastrointestinal:  Denies nausea, heartburn or change in bowel habits Skin: Denies abnormal skin rashes Lymphatics: Denies new lymphadenopathy or easy bruising Neurological:Denies  numbness, tingling or new weaknesses Behavioral/Psych: Mood is stable, no new changes  Extremities: No lower extremity edema Breast: right breast radiation changes and swelling All other systems were reviewed with the patient and are negative.  I have reviewed the past medical history, past surgical history, social history and family history with the patient and they are unchanged from previous note.  ALLERGIES:  has No Known Allergies.  MEDICATIONS:  Current Outpatient Prescriptions  Medication Sig Dispense Refill  . aspirin 81 MG chewable tablet Chew 81 mg by mouth daily.      Marland Kitchen atorvastatin (LIPITOR) 10 MG tablet Take 1 tablet (10 mg total) by mouth daily. 90 tablet 3  . Blood Glucose Calibration (OT ULTRA/FASTTK CNTRL SOLN) SOLN Use as instructed 1 each 11  . calcium citrate-vitamin D (CITRACAL+D) 315-200 MG-UNIT tablet Take 1 tablet by mouth 2 (two) times daily.    . Cholecalciferol (VITAMIN D3) 2000 UNITS TABS Take by mouth. Taking 3 times weekly    . Cyanocobalamin (VITAMIN B-12 PO) Take 1 tablet by mouth daily.    Marland Kitchen esomeprazole (NEXIUM) 40 MG capsule TAKE 1 CAPSULE DAILY BEFOREBREAKFAST (Patient taking differently: TAKE 1 CAPSULE every other day.) 90 capsule 3  . furosemide (LASIX) 40 MG tablet TAKE 1 TABLET DAILY FOR    EDEMA (Patient taking differently: Take 20 mg by mouth daily. ) 90 tablet 3  . gabapentin (NEURONTIN) 300 MG capsule TAKE 1 CAPSULE(300 MG) BY MOUTH AT BEDTIME 30 capsule 5  . glipiZIDE (GLUCOTROL XL) 5 MG 24 hr tablet Take 1 tablet (5 mg total) by mouth daily. 90 tablet 3  . glucose blood (ONETOUCH VERIO) test strip Use to  test blood sugar twice daily 300 each 3  . hyaluronate sodium (RADIAPLEXRX) GEL Apply 1 application topically once.    . ONE TOUCH LANCETS MISC Use to test blood sugar twice daily 600 each 3  . vitamin C (ASCORBIC ACID) 500 MG tablet Take 500 mg by mouth daily. Takes one tablet as needed.     No current facility-administered medications for  this visit.    PHYSICAL EXAMINATION: ECOG PERFORMANCE STATUS: 1 - Symptomatic but completely ambulatory  Filed Vitals:   11/09/15 1015  BP: 134/49  Pulse: 75  Temp: 97.7 F (36.5 C)  Resp: 18   Filed Weights   11/09/15 1015  Weight: 162 lb 8 oz (73.71 kg)    GENERAL:alert, no distress and comfortable SKIN: skin color, texture, turgor are normal, no rashes or significant lesions EYES: normal, Conjunctiva are pink and non-injected, sclera clear OROPHARYNX:no exudate, no erythema and lips, buccal mucosa, and tongue normal  NECK: supple, thyroid normal size, non-tender, without nodularity LYMPH:  no palpable lymphadenopathy in the cervical, axillary or inguinal LUNGS: clear to auscultation and percussion with normal breathing effort HEART: regular rate & rhythm and no murmurs and no lower extremity edema ABDOMEN:abdomen soft, non-tender and normal bowel sounds MUSCULOSKELETAL:no cyanosis of digits and no clubbing  NEURO: alert & oriented x 3 with fluent speech, no focal motor/sensory deficits EXTREMITIES: No lower extremity edema  LABORATORY DATA:  I have reviewed the data as listed   Chemistry      Component Value Date/Time   NA 141 07/26/2015 1224   NA 142 05/01/2015 1113   K 4.1 07/26/2015 1224   K 4.4 05/01/2015 1113   CL 104 05/01/2015 1113   CO2 29 07/26/2015 1224   CO2 30 05/01/2015 1113   BUN 32.3* 07/26/2015 1224   BUN 26* 05/01/2015 1113   CREATININE 1.5* 07/26/2015 1224   CREATININE 1.25* 05/01/2015 1113   CREATININE 1.43* 12/27/2014 1903      Component Value Date/Time   CALCIUM 9.8 07/26/2015 1224   CALCIUM 9.7 05/01/2015 1113   ALKPHOS 90 07/26/2015 1224   ALKPHOS 90 05/01/2015 1113   AST 14 07/26/2015 1224   AST 13 05/01/2015 1113   ALT 13 07/26/2015 1224   ALT 13 05/01/2015 1113   BILITOT 0.55 07/26/2015 1224   BILITOT 0.6 05/01/2015 1113       Lab Results  Component Value Date   WBC 5.0 07/26/2015   HGB 12.5 07/26/2015   HCT 37.8  07/26/2015   MCV 88.4 07/26/2015   PLT 230 07/26/2015   NEUTROABS 2.4 07/26/2015     ASSESSMENT & PLAN:  Breast cancer of upper-outer quadrant of right female breast ([place]) Rt. Lumpectomy 08/03/15: 1.2 cm, Grade 3, with DCIS (in situ margin 0.1 cm), LN not taken, Er 0%, PR 0%, Her 2 Neg ratio: 1.77 T1bNx (stage 1A) Completed adjuvant radiation 10/12/2015  Treatment plan: Surveillance with breast exams and annual mammograms (end of the year 2017) Follow-up with survivorship clinic.   No orders of the defined types were placed in this encounter.   The patient has a good understanding of the overall plan. she agrees with it. she will call with any problems that may develop before the next visit here.   Rulon Eisenmenger, MD 11/09/2015

## 2015-11-09 NOTE — Assessment & Plan Note (Signed)
Rt. Lumpectomy 08/03/15: 1.2 cm, Grade 3, with DCIS (in situ margin 0.1 cm), LN not taken, Er 0%, PR 0%, Her 2 Neg ratio: 1.77 T1bNx (stage 1A) Completed adjuvant radiation 10/12/2015  Treatment plan: Surveillance with breast exams and annual mammograms Follow-up with survivorship clinic.

## 2015-11-16 DIAGNOSIS — H903 Sensorineural hearing loss, bilateral: Secondary | ICD-10-CM | POA: Diagnosis not present

## 2015-11-27 ENCOUNTER — Encounter: Payer: Self-pay | Admitting: Oncology

## 2015-11-30 ENCOUNTER — Ambulatory Visit
Admission: RE | Admit: 2015-11-30 | Discharge: 2015-11-30 | Disposition: A | Discharge status: Home / Self Care | Payer: Medicare Other | Source: Ambulatory Visit | Attending: Radiation Oncology | Admitting: Radiation Oncology

## 2015-11-30 ENCOUNTER — Encounter: Payer: Self-pay | Admitting: Radiation Oncology

## 2015-11-30 VITALS — BP 152/50 | HR 89 | Temp 98.3°F | Resp 18 | Ht 61.0 in | Wt 164.0 lb

## 2015-11-30 DIAGNOSIS — Y842 Radiological procedure and radiotherapy as the cause of abnormal reaction of the patient, or of later complication, without mention of misadventure at the time of the procedure: Secondary | ICD-10-CM | POA: Diagnosis not present

## 2015-11-30 DIAGNOSIS — C50411 Malignant neoplasm of upper-outer quadrant of right female breast: Secondary | ICD-10-CM | POA: Diagnosis not present

## 2015-11-30 NOTE — Progress Notes (Signed)
Radiation Oncology         [phone] ________________________________  Name: [name] MRN: [id number]  Date: 11/30/2015  DOB: June 02, 1930  Follow-Up Visit Note  CC: Lottie Dawson, MD  Nicholas Lose, MD    ICD-9-CM ICD-10-CM   1. Breast cancer of upper-outer quadrant of right female breast ([place]) 174.4 C50.411     DIAGNOSIS: Stage IA (T1c, Nx, Mx )invasive ductal carcinoma of the upper-outer quadrant of the right female breast, ER (0%), PR (0%), Her2-neu negative.  Indication for treatment:  Breast conservation       Radiation treatment dates:   09/13/2015-10/12/2015  Site/dose:   42.72 gray in 16 fractions directed right breast with a boost of 12 gray in 6 fractions directed at the lumpectomy cavity  Interval History:  [name] here for follow up. She denies having pain and fatigue. The skin on her right breast has hyperpigmentation. Denies nipple discharge or bleeding. She reports some right sided axillary soreness.  ALLERGIES:  has No Known Allergies.  Meds: Current Outpatient Prescriptions  Medication Sig Dispense Refill  . aspirin 81 MG chewable tablet Chew 81 mg by mouth daily.      Marland Kitchen atorvastatin (LIPITOR) 10 MG tablet Take 1 tablet (10 mg total) by mouth daily. 90 tablet 3  . Blood Glucose Calibration (OT ULTRA/FASTTK CNTRL SOLN) SOLN Use as instructed 1 each 11  . calcium citrate-vitamin D (CITRACAL+D) 315-200 MG-UNIT tablet Take 1 tablet by mouth 2 (two) times daily.    . Cholecalciferol (VITAMIN D3) 2000 UNITS TABS Take by mouth. Taking 3 times weekly    . Cyanocobalamin (VITAMIN B-12 PO) Take 1 tablet by mouth daily.    Marland Kitchen esomeprazole (NEXIUM) 40 MG capsule TAKE 1 CAPSULE DAILY BEFOREBREAKFAST (Patient taking differently: TAKE 1 CAPSULE every other day.) 90 capsule 3  . furosemide (LASIX) 40 MG tablet TAKE 1 TABLET DAILY FOR    EDEMA (Patient taking differently: Take 20 mg by mouth daily. ) 90 tablet 3  . gabapentin (NEURONTIN) 300 MG capsule TAKE  1 CAPSULE(300 MG) BY MOUTH AT BEDTIME 30 capsule 5  . glipiZIDE (GLUCOTROL XL) 5 MG 24 hr tablet Take 1 tablet (5 mg total) by mouth daily. 90 tablet 3  . glucose blood (ONETOUCH VERIO) test strip Use to test blood sugar twice daily 300 each 3  . ONE TOUCH LANCETS MISC Use to test blood sugar twice daily 600 each 3  . hyaluronate sodium (RADIAPLEXRX) GEL Apply 1 application topically once. Reported on 11/30/2015    . vitamin C (ASCORBIC ACID) 500 MG tablet Take 500 mg by mouth daily. Reported on 11/30/2015     No current facility-administered medications for this encounter.    Physical Findings: The patient is in no acute distress. Patient is alert and oriented.  height is _0  (1.549 m) and weight is 164 lb (74.39 kg). Her oral temperature is 98.3 F (36.8 C). Her blood pressure is 152/50 and her pulse is 89. Her respiration is 18. .  No significant changes. No palpable cervical, supraclavicular or axillary lymphoadenopathy. The heart has a regular rate and rhythm. The lungs are clear to auscultation. Right breast with mild edema and hyperpigmentation changes but well healed. No dominant mass appreciated. No nipple discharge or bleeding.  Lab Findings: Lab Results  Component Value Date   WBC 5.0 07/26/2015   HGB 12.5 07/26/2015   HCT 37.8 07/26/2015   MCV 88.4 07/26/2015   PLT 230 07/26/2015    Radiographic Findings: No  results found.  Impression:  The patient is recovering from the effects of radiation.  No evidence of disease recurrence on clinical exam.   Plan:  RadOnc follow up in 6 months. Patient has been set up with survivorship. She will not be following up with medical oncology.  -----------------------------------  Blair Promise, PhD, MD  This document serves as a record of services personally performed by Gery Pray, MD. It was created on his behalf by Derek Mound, a trained medical scribe. The creation of this record is based on the scribe's personal observations  and the provider's statements to them. This document has been checked and approved by the attending provider.

## 2015-11-30 NOTE — Progress Notes (Signed)
[name] here for follow up.  She denies having pain and fatigue.  The skin on her right breast has hyperpigmentation.  BP 152/50 mmHg  Pulse 89  Temp(Src) 98.3 F (36.8 C) (Oral)  Resp 18  Ht 5\' 1"  (1.549 m)  Wt 164 lb (74.39 kg)  BMI 31.00 kg/m2   Wt Readings from Last 3 Encounters:  11/30/15 164 lb (74.39 kg)  11/09/15 162 lb 8 oz (73.71 kg)  10/10/15 159 lb 14.4 oz (72.53 kg)

## 2015-12-01 ENCOUNTER — Telehealth: Payer: Self-pay | Admitting: Oncology

## 2015-12-01 NOTE — Addendum Note (Signed)
Encounter addended by: Jacqulyn Liner, RN on: 12/01/2015  7:48 AM<BR>     Documentation filed: Charges VN

## 2015-12-01 NOTE — Telephone Encounter (Signed)
Called [name] and advised her that an appointment has been made for 06/06/16 at 11:40 am.  She verbalized understanding and agreement.

## 2015-12-05 NOTE — Addendum Note (Signed)
Encounter addended by: Jacqulyn Liner, RN on: 12/05/2015  9:58 AM<BR>     Documentation filed: Charges VN

## 2015-12-08 NOTE — Addendum Note (Signed)
Encounter addended by: Jacqulyn Liner, RN on: 12/08/2015  1:40 PM<BR>     Documentation filed: Charges VN

## 2015-12-15 ENCOUNTER — Ambulatory Visit (INDEPENDENT_AMBULATORY_CARE_PROVIDER_SITE_OTHER): Payer: Medicare Other | Admitting: Internal Medicine

## 2015-12-15 VITALS — BP 124/76 | HR 85 | Temp 98.1°F | Resp 16 | Ht 61.5 in | Wt 161.0 lb

## 2015-12-15 DIAGNOSIS — W57XXXA Bitten or stung by nonvenomous insect and other nonvenomous arthropods, initial encounter: Secondary | ICD-10-CM | POA: Diagnosis not present

## 2015-12-15 DIAGNOSIS — R05 Cough: Secondary | ICD-10-CM | POA: Diagnosis not present

## 2015-12-15 DIAGNOSIS — S40262A Insect bite (nonvenomous) of left shoulder, initial encounter: Secondary | ICD-10-CM

## 2015-12-15 DIAGNOSIS — R059 Cough, unspecified: Secondary | ICD-10-CM

## 2015-12-15 DIAGNOSIS — J0100 Acute maxillary sinusitis, unspecified: Secondary | ICD-10-CM

## 2015-12-15 MED ORDER — BENZONATATE 100 MG PO CAPS: 100.0000 mg | ORAL_CAPSULE | Freq: Three times a day (TID) | ORAL | Status: DC | PRN

## 2015-12-15 MED ORDER — TRIAMCINOLONE ACETONIDE 0.1 % EX CREA: 1.0000 "application " | TOPICAL_CREAM | Freq: Two times a day (BID) | CUTANEOUS | Status: DC

## 2015-12-15 MED ORDER — AMOXICILLIN 875 MG PO TABS: 875.0000 mg | ORAL_TABLET | Freq: Two times a day (BID) | ORAL | Status: DC

## 2015-12-15 NOTE — Progress Notes (Signed)
By signing my name below, I, Mesha Guinyard, attest that this documentation has been prepared under the direction and in the presence of Tami Lin, MD.  Electronically Signed: Verlee Monte, Medical Scribe. 12/15/2015. 10:44 AM.  Subjective:    Patient ID: [name], female    DOB: 06-26-1930, 82 y.o.   MRN: [id number]  HPI Chief Complaint  Patient presents with  . Insect Bite    on shoulders and back, happen on Monday     HPI Comments: [name] is a 82 y.o. female who presents to the [facility] complaining of sore throat. Pt has experienced productive coughing with yellowish sputum in the back of her throat that's mainly at night. Pt is horse, trouble swallowing, and post nasal drainage. Pt doesn't have fever, ear popping, SOB, or congestion.  Pt is okay with taking amoxicillen. She's used it when she went to the dentist.  Pt complains of multiple bug bites on her left shoulder blade onset 4 days ago. Pt is experiencing itchiness, and localized erythema on the site of the bites. Pt was working in the yard under a magnolia tree when she felt itchiness on her left shoulder. Pt stratched her shoulder with dirty hands. Pt's has experienced shingles was on the same site but it was painful . Patient Active Problem List   Diagnosis Date Noted  . Genetic testing 08/18/2015  . Family history of breast cancer   . Family history of prostate cancer   . Family history of pancreatic cancer   . Breast cancer of upper-outer quadrant of right female breast ([place]) 07/21/2015  . Insomnia 05/06/2015  . Medication management 12/16/2013  . Hand arthritis 12/16/2013  . Rectal bleeding 12/16/2013  . Cold intolerance 06/17/2013  . Renal insufficiency 12/23/2012  . Post-menopause 12/23/2012  . Hyperlipidemia 12/23/2012  . Postmenopausal HRT (hormone replacement therapy) 12/23/2012  . History of shingles 12/23/2012  . Wears hearing aid 12/23/2012  . Nonspecific  abnormal unspecified cardiovascular function study 02/28/2012  . Chronic sore throat 01/13/2012  . Hoarse 01/13/2012  . Chest pressure 12/03/2011  . Itching  mid back 12/03/2011  . Abnormal EKG 12/03/2011  . Medicare annual wellness visit, subsequent 12/03/2011  . DJD (degenerative joint disease) 12/03/2011  . DIABETES MELLITUS, TYPE II, CONTROLLED 11/15/2009  . VITAMIN D DEFICIENCY 11/15/2009  . EXOGENOUS OBESITY 11/15/2009  . ANXIETY DEPRESSION 12/22/2008  . HYPERTHYROIDISM 08/23/2008  . TOTAL KNEE REPLACEMENT, LEFT, HX OF 08/23/2008  . HYPOTHYROIDISM 10/08/2007  . ANEMIA 10/08/2007  . ARTHRITIS 10/08/2007  . OSTEOPENIA 10/08/2007  . HYPERLIPIDEMIA 11/07/2006  . GERD 11/07/2006     No Known Allergies Prior to Admission medications   Medication Sig Start Date End Date Taking? Authorizing Provider  aspirin 81 MG chewable tablet Chew 81 mg by mouth daily.     Yes Historical Provider, MD  atorvastatin (LIPITOR) 10 MG tablet Take 1 tablet (10 mg total) by mouth daily. 05/01/15  Yes Burnis Medin, MD  Blood Glucose Calibration (OT ULTRA/FASTTK CNTRL SOLN) SOLN Use as instructed 12/23/12  Yes Burnis Medin, MD  calcium citrate-vitamin D (CITRACAL+D) 315-200 MG-UNIT tablet Take 1 tablet by mouth 2 (two) times daily.   Yes Historical Provider, MD  Cholecalciferol (VITAMIN D3) 2000 UNITS TABS Take by mouth. Taking 3 times weekly   Yes Historical Provider, MD  Cyanocobalamin (VITAMIN B-12 PO) Take 1 tablet by mouth daily.   Yes Historical Provider, MD  esomeprazole (NEXIUM) 40 MG capsule TAKE 1 CAPSULE  DAILY BEFOREBREAKFAST Patient taking differently: TAKE 1 CAPSULE every other day. 05/01/15  Yes Burnis Medin, MD  furosemide (LASIX) 40 MG tablet TAKE 1 TABLET DAILY FOR    EDEMA Patient taking differently: Take 20 mg by mouth daily.  05/01/15  Yes Burnis Medin, MD  gabapentin (NEURONTIN) 300 MG capsule TAKE 1 CAPSULE(300 MG) BY MOUTH AT BEDTIME 08/24/15  Yes Burnis Medin, MD    glipiZIDE (GLUCOTROL XL) 5 MG 24 hr tablet Take 1 tablet (5 mg total) by mouth daily. 05/01/15  Yes Burnis Medin, MD  glucose blood (ONETOUCH VERIO) test strip Use to test blood sugar twice daily 05/01/15  Yes Burnis Medin, MD  ONE [facility] LANCETS MISC Use to test blood sugar twice daily 05/01/15  Yes Burnis Medin, MD  vitamin C (ASCORBIC ACID) 500 MG tablet Take 500 mg by mouth daily. Reported on 11/30/2015   Yes Historical Provider, MD   Social History   Social History  . Marital Status: Married    Spouse Name: N/A  . Number of Children: 2  . Years of Education: N/A   Occupational History  . Retired    Social History Main Topics  . Smoking status: Never Smoker   . Smokeless tobacco: Never Used  . Alcohol Use: No  . Drug Use: No  . Sexual Activity: No   Other Topics Concern  . Not on file   Social History Narrative   HH 2 married   Works for [employer] for 40 years retired   No pets     Review of Systems  Constitutional: Negative for fever.  HENT: Positive for sore throat and trouble swallowing. Negative for ear pain.   Respiratory: Positive for cough. Negative for shortness of breath.     Objective:  BP 124/76 mmHg  Pulse 85  Temp(Src) 98.1 F (36.7 C) (Oral)  Resp 16  Ht 5' 1.5" (1.562 m)  Wt 161 lb (73.029 kg)  BMI 29.93 kg/m2  SpO2 96%  Physical Exam  Constitutional: She appears well-developed and well-nourished. No distress.  HENT:  Head: Normocephalic and atraumatic.  Checked the conjunctiva- clear Nares with boggy turbinates Throat slightly red without exudate No anterior cervial adenopathy No masses  Eyes: Conjunctivae are normal.  Neck: Neck supple.  Cardiovascular: Normal rate.   Pulmonary/Chest: Effort normal.  Chest clear, no auscultation  Neurological: She is alert.  Skin: Skin is warm and dry.  Left shoulder posterior and thoracic: several erythemtaous papules- linear, or at least grouped. No vasiculation. No secondary infection.   Psychiatric: She has a normal mood and affect. Her behavior is normal.  Nursing note and vitals reviewed.    Assessment & Plan:  Cough  Acute maxillary sinusitis, recurrence not specified  Insect bite  Meds ordered this encounter  Medications  . amoxicillin (AMOXIL) 875 MG tablet    Sig: Take 1 tablet (875 mg total) by mouth 2 (two) times daily.    Dispense:  20 tablet    Refill:  0  . benzonatate (TESSALON PERLES) 100 MG capsule    Sig: Take 1 capsule (100 mg total) by mouth 3 (three) times daily as needed for cough.    Dispense:  20 capsule    Refill:  0  . triamcinolone cream (KENALOG) 0.1 %    Sig: Apply 1 application topically 2 (two) times daily.    Dispense:  30 g    Refill:  0   F/u 1 w not well  I  have completed the patient encounter in its entirety as documented by the scribe, with editing by me where necessary. Candid Bovey P. Laney Pastor, M.D.'

## 2015-12-15 NOTE — Patient Instructions (Signed)
     IF you received an x-ray today, you will receive an invoice from [facility]. Please contact [facility] at [phone] with questions or concerns regarding your invoice.   IF you received labwork today, you will receive an invoice from [facility]. Please contact [facility] at [phone] with questions or concerns regarding your invoice.   Our billing staff will not be able to assist you with questions regarding bills from these companies.  You will be contacted with the lab results as soon as they are available. The fastest way to get your results is to activate your My Chart account. Instructions are located on the last page of this paperwork. If you have not heard from us regarding the results in 2 weeks, please contact this office.      

## 2015-12-21 ENCOUNTER — Ambulatory Visit (HOSPITAL_BASED_OUTPATIENT_CLINIC_OR_DEPARTMENT_OTHER): Payer: Medicare Other | Admitting: Nurse Practitioner

## 2015-12-21 ENCOUNTER — Encounter: Payer: Self-pay | Admitting: Nurse Practitioner

## 2015-12-21 VITALS — BP 133/60 | HR 89 | Temp 98.4°F | Resp 18 | Ht 61.5 in | Wt 160.9 lb

## 2015-12-21 DIAGNOSIS — Z853 Personal history of malignant neoplasm of breast: Secondary | ICD-10-CM | POA: Diagnosis not present

## 2015-12-21 DIAGNOSIS — C50411 Malignant neoplasm of upper-outer quadrant of right female breast: Secondary | ICD-10-CM

## 2015-12-21 NOTE — Progress Notes (Signed)
CLINIC:  Cancer Survivorship   REASON FOR VISIT:  Routine follow-up post-treatment for a recent history of breast cancer.  BRIEF ONCOLOGIC HISTORY:  Oncology History         Breast cancer of upper-outer quadrant of right female breast ([place])   06/27/2015 Mammogram New oval focal asymmetry in right breast, posterior depth superior region   07/19/2015 Initial Diagnosis Right breast biopsy: Invasive ductal carcinoma grade 2-3, PR 0%, PR 0%, Ki-67 20%, HER-2 negative ratio 1.77; 5 mm abnormality at 10:00 position   07/19/2015 Clinical Stage Stage IA: T1b N0   08/02/2015 Procedure Breast/Ovarian panel (geneDx) no deleterious mutations at ATM, BARD1, BRCA1, BRCA2, BRIP1, CDH1, CHEK2, EPCAM, FANCC, MLH1, MSH2, MSH6, NBN, PALB2, PMS2, PTEN, RAD51C, RAD51D, TP53, and XRCC2   08/03/2015 Surgery Rt. Lumpectomy: IDC, 1.2 cm, Grade 3, with DCIS (in situ margin 0.1 cm), LN not taken, Er 0%, PR 0%, Her 2 Neg equivocal on FISH (ratio 1.93),  Negative for HER2 on IHC.   08/03/2015 Pathologic Stage Stage IA: T1c Nx   09/13/2015 - 10/12/2015 Radiation Therapy Adjuvant radiation therapy: 42.72 gray in 16 fractions directed right breast with a boost of 12 gray in 6 fractions directed at the lumpectomy cavity. Total dose: 54.72 Gy    INTERVAL HISTORY:  [name] presents to the [facility] today for our initial meeting to review her survivorship care plan detailing her treatment course for breast cancer, as well as monitoring long-term side effects of that treatment, education regarding health maintenance, screening, and overall wellness and health promotion.     Overall, [name] reports feeling quite well since completing her radiation therapy approximately two months ago.  She denies headache, cough, shortness of breath or bone pain.  She has a good appetite and denies any weight loss.  She has had some difficulty sleeping and does report minimal fatigue, adding, "but at my age, who wouldn't be tired?"   She reports continued heaviness in her right breast and the presence of some thickening post treatment.   She has seen Drs. Kinard and Swisher who felt that she was doing well. She is completing a course of amoxicillin that was prescribed to her after her visit to an urgent care for sore throat. She is still a little hoarse today, but denies any fever or chills.  She also was recently bitten by a spider (per the physician at the urgent care) along her left shoulder, and this is improving.    REVIEW OF SYSTEMS:  General: Fatigue as above. Denies fever, chills, unintentional weight loss, or night sweats.   HEENT: Wears glasses. Denies visual changes, hearing loss, mouth sores or difficulty swallowing. Cardiac: Denies palpitations, chest pain, and lower extremity edema.  Respiratory: Denies wheeze or dyspnea on exertion.  Breast: As above. GI: Denies abdominal pain, constipation, diarrhea, nausea, or vomiting.  GU: Denies dysuria, hematuria, vaginal bleeding, vaginal discharge, or vaginal dryness.  Musculoskeletal: As above. Neuro: Denies recent fall or numbness / tingling in her extremities. Skin: Denies rash, pruritis, or open wounds.  Psych: Denies depression, anxiety, insomnia, or memory loss.   A 14-point review of systems was completed and was negative, except as noted above.   ONCOLOGY TREATMENT TEAM:  1. Surgeon:  Dr. Donne Hazel at [facility]  2. Medical Oncologist: Dr. Lindi Adie 3. Radiation Oncologist: Dr. Sondra Come    PAST MEDICAL/SURGICAL HISTORY:  Past Medical History  Diagnosis Date  . Arthritis   . Diabetes mellitus   . Colon polyps  adenomatous  . Diverticulosis   . Hemorrhoids   . Dyslipidemia   . GERD (gastroesophageal reflux disease)   . Renal insufficiency   . DDD (degenerative disc disease)   . Shingles   . Breast cancer of upper-outer quadrant of right female breast ([place]) 07/21/2015  . Breast cancer ([place])   . Peripheral edema     ankles, takes  lasix  . PONV (postoperative nausea and vomiting)   . Family history of breast cancer   . Family history of prostate cancer   . Family history of pancreatic cancer   . Radiation 09/13/15-10/12/15    42.72 gray to right breast with lumpectomy cavity boost of 12 gray   Past Surgical History  Procedure Laterality Date  . Total shoulder arthroplasty Right 2010  . Total knee arthroplasty Right 2003  . Total knee arthroplasty Left 2010  . Umbilical hernia repair  1958  . Vaginal hysterectomy  1975  . Carpal tunnel release Right 1977  . Cataract extraction w/ intraocular lens  implant, bilateral Bilateral     left 1996; right 1997  . Ganglion cyst excision Right 2003    hand  . Joint replacement    . Breast lumpectomy with radioactive seed localization Right 08/03/2015    Procedure: BREAST LUMPECTOMY WITH RADIOACTIVE SEED LOCALIZATION;  Surgeon: [name], MD;  Location: [place];  Service: General;  Laterality: Right;     ALLERGIES:  No Known Allergies   CURRENT MEDICATIONS:  Current Outpatient Prescriptions on File Prior to Visit  Medication Sig Dispense Refill  . amoxicillin (AMOXIL) 875 MG tablet Take 1 tablet (875 mg total) by mouth 2 (two) times daily. 20 tablet 0  . aspirin 81 MG chewable tablet Chew 81 mg by mouth daily.      Marland Kitchen atorvastatin (LIPITOR) 10 MG tablet Take 1 tablet (10 mg total) by mouth daily. 90 tablet 3  . benzonatate (TESSALON PERLES) 100 MG capsule Take 1 capsule (100 mg total) by mouth 3 (three) times daily as needed for cough. 20 capsule 0  . Blood Glucose Calibration (OT ULTRA/FASTTK CNTRL SOLN) SOLN Use as instructed 1 each 11  . calcium citrate-vitamin D (CITRACAL+D) 315-200 MG-UNIT tablet Take 1 tablet by mouth 2 (two) times daily.    . Cholecalciferol (VITAMIN D3) 2000 UNITS TABS Take by mouth. Taking 3 times weekly    . Cyanocobalamin (VITAMIN B-12 PO) Take 1 tablet by mouth daily.    Marland Kitchen esomeprazole (NEXIUM) 40 MG capsule TAKE 1  CAPSULE DAILY BEFOREBREAKFAST (Patient taking differently: TAKE 1 CAPSULE every other day.) 90 capsule 3  . furosemide (LASIX) 40 MG tablet TAKE 1 TABLET DAILY FOR    EDEMA (Patient taking differently: Take 20 mg by mouth daily. ) 90 tablet 3  . gabapentin (NEURONTIN) 300 MG capsule TAKE 1 CAPSULE(300 MG) BY MOUTH AT BEDTIME 30 capsule 5  . glipiZIDE (GLUCOTROL XL) 5 MG 24 hr tablet Take 1 tablet (5 mg total) by mouth daily. 90 tablet 3  . glucose blood (ONETOUCH VERIO) test strip Use to test blood sugar twice daily 300 each 3  . ONE TOUCH LANCETS MISC Use to test blood sugar twice daily 600 each 3  . triamcinolone cream (KENALOG) 0.1 % Apply 1 application topically 2 (two) times daily. 30 g 0  . vitamin C (ASCORBIC ACID) 500 MG tablet Take 500 mg by mouth daily. Reported on 12/21/2015     No current facility-administered medications on file prior to visit.  ONCOLOGIC FAMILY HISTORY:  Family History  Problem Relation Age of Onset  . Colon cancer Neg Hx   . Stomach cancer Neg Hx   . Coronary artery disease Brother     MI in his 43s  . Pancreatic cancer Mother 58  . Diabetes Mother   . Prostate cancer Brother   . Diabetes Father   . Heart disease Father   . Diabetes Brother   . Heart attack Brother   . Diabetes Sister   . Diabetes Daughter   . Diabetes Son      GENETIC COUNSELING/TESTING: No    SOCIAL HISTORY:  DENNIS KILLILEA is married and lives with her spouse in [place], [place].  She has 2 children. Ms. Mcenroe is currently retired.  She denies any current or history of tobacco, alcohol, or illicit drug use.     PHYSICAL EXAMINATION:  Vital Signs: Filed Vitals:   12/21/15 1021  BP: 133/60  Pulse: 89  Temp: 98.4 F (36.9 C)  Resp: 18   ECOG Performance Status: 1  General: Well-nourished, well-appearing female in no acute distress.  She is unaccompanied in clinic today.   HEENT: Head is atraumatic and normocephalic.  Pupils equal and reactive to light  and accomodation. Conjunctivae clear without exudate.  Sclerae anicteric. Oral mucosa is pink, moist, and intact without lesions.  Oropharynx is pink without lesions or erythema.  Lymph: No cervical, supraclavicular, infraclavicular, or axillary lymphadenopathy noted on palpation.  Cardiovascular: Regular rate and rhythm without murmurs, rubs, or gallops. Respiratory: Clear to auscultation bilaterally. Chest expansion symmetric without accessory muscle use on inspiration or expiration.  GI: Abdomen soft and round. No tenderness to palpation. Bowel sounds normoactive in 4 quadrants. GU: Deferred.  Neuro: No focal deficits. Steady gait.  Psych: Mood and affect normal and appropriate for situation.  Extremities: No edema, cyanosis, or clubbing.  Skin: Warm and dry. No open lesions noted. Four small areas of healing along left shoulder at site of reported spider bite.  No erythema, swelling, puncture wound, or induration noted.    LABORATORY DATA:  None for this visit.  DIAGNOSTIC IMAGING:  None for this visit.     ASSESSMENT AND PLAN:   1. Breast cancer: Stage IA invasive ductal carcinoma of the right breast (07/2015),  ER negative, PR negative, HER2/neu negative, S/P lumpectomy (07/2015) without SLNB, no chemotherapy recommended due to age, followed by adjuvant radiation therapy to the right breast completed 10/2015, now followed in a program of surveillance.  Ms. Weichert is doing well without clinical symptoms worrisome for disease recurrence. She will follow-up with her radiation oncologist,  Dr. Sondra Come in November 2017 as well as Dr. Donne Hazel.  She will return to the survivorship clinic in one year's time for eval with history and physical examination per surveillance protocol.  She was advised to report any new mass or lump in her breast, shortness of breath, bone pain or headache to [place] if they occur. Her mammogram will be due in December 2017.  A comprehensive survivorship care plan and treatment  summary was reviewed with the patient today detailing her breast cancer diagnosis, treatment course, potential late/long-term effects of treatment, appropriate follow-up care with recommendations for the future, and patient education resources.  A copy of this summary, along with a letter will be sent to the patient's primary care provider via in basket message after today's visit.  Ms. Biddle is welcome to return to the Survivorship Clinic in the future, as needed; no follow-up will  be scheduled at this time.    2. Cancer screening:  Due to Ms. Guarino's history and her age, she should receive screening for skin cancers as well as continued breast cancer screening.  She was told after this most recent colonoscopy that she no longer needs them and she is S/P hysterectomy. The information and recommendations are listed on the patient's comprehensive care plan/treatment summary and were reviewed in detail with the patient.    3. Health maintenance and wellness promotion: Ms. Fuquay was encouraged to consume 5-7 servings of fruits and vegetables per day. We reviewed the "Nutrition Rainbow" handout, as well as discussed recommendations to maximize nutrition and minimize recurrence, such as increased intake of fruits, vegetables, lean proteins, and minimizing the intake of red meats and processed foods.  She was also encouraged to engage in moderate to vigorous exercise for 30 minutes per day most days of the week. We discussed the [employer] fitness program, which is designed for cancer survivors to help them become more physically fit after cancer treatments.  She was instructed to limit her alcohol consumption and continue to abstain from tobacco use. A copy of the "Take Control of Your Health" brochure was given to her reinforcing these recommendations.   4. Support services/counseling: It is not uncommon for this period of the patient's cancer care trajectory to be one of many emotions and stressors. We  discussed an opportunity for her to participate in the next session of [facility] ("Finding Your New Normal") support group series designed for patients after they have completed treatment.  Ms. Reasons was encouraged to take advantage of our many other support services programs, support groups, and/or counseling in coping with her new life as a cancer survivor after completing anti-cancer treatment.  She was offered support today through active listening and expressive supportive counseling.  She was given information regarding our available services and encouraged to contact me with any questions or for help enrolling in any of our support group/programs.    A total of 45 minutes of face-to-face time was spent with this patient with greater than 50% of that time in counseling and care-coordination.   Sylvan Cheese, NP  Survivorship Program [facility] [phone]   Note: PRIMARY CARE PROVIDER Lottie Dawson, [place] [phone]

## 2016-01-03 ENCOUNTER — Other Ambulatory Visit (INDEPENDENT_AMBULATORY_CARE_PROVIDER_SITE_OTHER): Payer: Medicare Other

## 2016-01-03 DIAGNOSIS — I10 Essential (primary) hypertension: Secondary | ICD-10-CM | POA: Diagnosis not present

## 2016-01-03 DIAGNOSIS — E119 Type 2 diabetes mellitus without complications: Secondary | ICD-10-CM

## 2016-01-03 LAB — HEMOGLOBIN A1C: Hgb A1c MFr Bld: 7.3 % — ABNORMAL HIGH (ref 4.6–6.5)

## 2016-01-03 LAB — BASIC METABOLIC PANEL
BUN: 26 mg/dL — ABNORMAL HIGH (ref 6–23)
CO2: 33 mEq/L — ABNORMAL HIGH (ref 19–32)
Calcium: 9.5 mg/dL (ref 8.4–10.5)
Chloride: 103 mEq/L (ref 96–112)
Creatinine, Ser: 1.24 mg/dL — ABNORMAL HIGH (ref 0.40–1.20)
GFR: 43.81 mL/min — ABNORMAL LOW (ref >60.00)
Glucose, Bld: 173 mg/dL — ABNORMAL HIGH (ref 70–99)
Potassium: 3.8 mEq/L (ref 3.5–5.1)
Sodium: 139 mEq/L (ref 135–145)

## 2016-01-08 ENCOUNTER — Encounter (HOSPITAL_COMMUNITY): Payer: Self-pay | Admitting: Emergency Medicine

## 2016-01-08 ENCOUNTER — Emergency Department (HOSPITAL_COMMUNITY): Payer: Medicare Other

## 2016-01-08 ENCOUNTER — Emergency Department (HOSPITAL_COMMUNITY)
Admission: EM | Admit: 2016-01-08 | Discharge: 2016-01-08 | Disposition: A | Discharge status: Home / Self Care | Payer: Medicare Other | Attending: Emergency Medicine | Admitting: Emergency Medicine

## 2016-01-08 ENCOUNTER — Telehealth: Payer: Self-pay | Admitting: Internal Medicine

## 2016-01-08 DIAGNOSIS — E785 Hyperlipidemia, unspecified: Secondary | ICD-10-CM | POA: Diagnosis not present

## 2016-01-08 DIAGNOSIS — M549 Dorsalgia, unspecified: Secondary | ICD-10-CM | POA: Diagnosis not present

## 2016-01-08 DIAGNOSIS — E119 Type 2 diabetes mellitus without complications: Secondary | ICD-10-CM | POA: Insufficient documentation

## 2016-01-08 DIAGNOSIS — I700 Atherosclerosis of aorta: Secondary | ICD-10-CM | POA: Insufficient documentation

## 2016-01-08 DIAGNOSIS — M5442 Lumbago with sciatica, left side: Secondary | ICD-10-CM | POA: Diagnosis not present

## 2016-01-08 DIAGNOSIS — Z7984 Long term (current) use of oral hypoglycemic drugs: Secondary | ICD-10-CM | POA: Diagnosis not present

## 2016-01-08 DIAGNOSIS — M1990 Unspecified osteoarthritis, unspecified site: Secondary | ICD-10-CM | POA: Diagnosis not present

## 2016-01-08 DIAGNOSIS — Z853 Personal history of malignant neoplasm of breast: Secondary | ICD-10-CM | POA: Insufficient documentation

## 2016-01-08 DIAGNOSIS — M545 Low back pain: Secondary | ICD-10-CM | POA: Diagnosis present

## 2016-01-08 DIAGNOSIS — Z7982 Long term (current) use of aspirin: Secondary | ICD-10-CM | POA: Diagnosis not present

## 2016-01-08 DIAGNOSIS — M5432 Sciatica, left side: Secondary | ICD-10-CM

## 2016-01-08 LAB — URINALYSIS, ROUTINE W REFLEX MICROSCOPIC
Bilirubin Urine: NEGATIVE
Glucose, UA: 250 mg/dL — AB
Hgb urine dipstick: NEGATIVE
Ketones, ur: NEGATIVE mg/dL
Nitrite: NEGATIVE
Protein, ur: NEGATIVE mg/dL
Specific Gravity, Urine: 1.016 (ref 1.005–1.030)
pH: 6 (ref 5.0–8.0)

## 2016-01-08 LAB — URINE MICROSCOPIC-ADD ON: RBC / HPF: NONE SEEN RBC/hpf (ref 0–5)

## 2016-01-08 MED ORDER — NAPROXEN 375 MG PO TABS: 375.0000 mg | ORAL_TABLET | Freq: Two times a day (BID) | ORAL | Status: DC

## 2016-01-08 MED ORDER — ORPHENADRINE CITRATE ER 100 MG PO TB12: 100.0000 mg | ORAL_TABLET | Freq: Two times a day (BID) | ORAL | Status: DC

## 2016-01-08 MED ORDER — CYCLOBENZAPRINE HCL 10 MG PO TABS
5.0000 mg | ORAL_TABLET | Freq: Once | ORAL | Status: AC
  Administered 2016-01-08: 5 mg via ORAL
  Filled 2016-01-08: qty 1

## 2016-01-08 MED ORDER — IBUPROFEN 200 MG PO TABS
600.0000 mg | ORAL_TABLET | Freq: Once | ORAL | Status: AC
  Administered 2016-01-08: 600 mg via ORAL
  Filled 2016-01-08: qty 3

## 2016-01-08 MED ORDER — OXYCODONE-ACETAMINOPHEN 5-325 MG PO TABS
1.0000 | ORAL_TABLET | Freq: Once | ORAL | Status: AC
  Administered 2016-01-08: 1 via ORAL
  Filled 2016-01-08: qty 1

## 2016-01-08 MED ORDER — OXYCODONE-ACETAMINOPHEN 5-325 MG PO TABS: 1.0000 | ORAL_TABLET | ORAL | Status: DC | PRN

## 2016-01-08 NOTE — Discharge Instructions (Signed)
Sciatica °Sciatica is pain, weakness, numbness, or tingling along the path of the sciatic nerve. The nerve starts in the lower back and runs down the back of each leg. The nerve controls the muscles in the lower leg and in the back of the knee, while also providing sensation to the back of the thigh, lower leg, and the sole of your foot. Sciatica is a symptom of another medical condition. For instance, nerve damage or certain conditions, such as a herniated disk or bone spur on the spine, pinch or put pressure on the sciatic nerve. This causes the pain, weakness, or other sensations normally associated with sciatica. Generally, sciatica only affects one side of the body. °CAUSES  °· Herniated or slipped disc. °· Degenerative disk disease. °· A pain disorder involving the narrow muscle in the buttocks (piriformis syndrome). °· Pelvic injury or fracture. °· Pregnancy. °· Tumor (rare). °SYMPTOMS  °Symptoms can vary from mild to very severe. The symptoms usually travel from the low back to the buttocks and down the back of the leg. Symptoms can include: °· Mild tingling or dull aches in the lower back, leg, or hip. °· Numbness in the back of the calf or sole of the foot. °· Burning sensations in the lower back, leg, or hip. °· Sharp pains in the lower back, leg, or hip. °· Leg weakness. °· Severe back pain inhibiting movement. °These symptoms may get worse with coughing, sneezing, laughing, or prolonged sitting or standing. Also, being overweight may worsen symptoms. °DIAGNOSIS  °Your caregiver will perform a physical exam to look for common symptoms of sciatica. He or she may ask you to do certain movements or activities that would trigger sciatic nerve pain. Other tests may be performed to find the cause of the sciatica. These may include: °· Blood tests. °· X-rays. °· Imaging tests, such as an MRI or CT scan. °TREATMENT  °Treatment is directed at the cause of the sciatic pain. Sometimes, treatment is not necessary  and the pain and discomfort goes away on its own. If treatment is needed, your caregiver may suggest: °· Over-the-counter medicines to relieve pain. °· Prescription medicines, such as anti-inflammatory medicine, muscle relaxants, or narcotics. °· Applying heat or ice to the painful area. °· Steroid injections to lessen pain, irritation, and inflammation around the nerve. °· Reducing activity during periods of pain. °· Exercising and stretching to strengthen your abdomen and improve flexibility of your spine. Your caregiver may suggest losing weight if the extra weight makes the back pain worse. °· Physical therapy. °· Surgery to eliminate what is pressing or pinching the nerve, such as a bone spur or part of a herniated disk. °HOME CARE INSTRUCTIONS  °· Only take over-the-counter or prescription medicines for pain or discomfort as directed by your caregiver. °· Apply ice to the affected area for 20 minutes, 3-4 times a day for the first 48-72 hours. Then try heat in the same way. °· Exercise, stretch, or perform your usual activities if these do not aggravate your pain. °· Attend physical therapy sessions as directed by your caregiver. °· Keep all follow-up appointments as directed by your caregiver. °· Do not wear high heels or shoes that do not provide proper support. °· Check your mattress to see if it is too soft. A firm mattress may lessen your pain and discomfort. °SEEK IMMEDIATE MEDICAL CARE IF:  °· You lose control of your bowel or bladder (incontinence). °· You have increasing weakness in the lower back, pelvis, buttocks,   or legs.  You have redness or swelling of your back.  You have a burning sensation when you urinate.  You have pain that gets worse when you lie down or awakens you at night.  Your pain is worse than you have experienced in the past.  Your pain is lasting longer than 4 weeks.  You are suddenly losing weight without reason. MAKE SURE YOU:  Understand these  instructions.  Will watch your condition.  Will get help right away if you are not doing well or get worse.   This information is not intended to replace advice given to you by your health care provider. Make sure you discuss any questions you have with your health care provider.   Document Released: 06/18/2001 Document Revised: 03/15/2015 Document Reviewed: 11/03/2011 Elsevier Interactive Patient Education 2016 [employer]  Naproxen and naproxen sodium oral immediate-release tablets What is this medicine? NAPROXEN (na PROX en) is a non-steroidal anti-inflammatory drug (NSAID). It is used to reduce swelling and to treat pain. This medicine may be used for dental pain, headache, or painful monthly periods. It is also used for painful joint and muscular problems such as arthritis, tendinitis, bursitis, and gout. This medicine may be used for other purposes; ask your health care provider or pharmacist if you have questions. What should I tell my health care provider before I take this medicine? They need to know if you have any of these conditions: -asthma -cigarette smoker -drink more than 3 alcohol containing drinks a day -heart disease or circulation problems such as heart failure or leg edema (fluid retention) -high blood pressure -kidney disease -liver disease -stomach bleeding or ulcers -an unusual or allergic reaction to naproxen, aspirin, other NSAIDs, other medicines, foods, dyes, or preservatives -pregnant or trying to get pregnant -breast-feeding How should I use this medicine? Take this medicine by mouth with a glass of water. Follow the directions on the prescription label. Take it with food if your stomach gets upset. Try to not lie down for at least 10 minutes after you take it. Take your medicine at regular intervals. Do not take your medicine more often than directed. Long-term, continuous use may increase the risk of heart attack or stroke. A special MedGuide will be  given to you by the pharmacist with each prescription and refill. Be sure to read this information carefully each time. Talk to your pediatrician regarding the use of this medicine in children. Special care may be needed. Overdosage: If you think you have taken too much of this medicine contact a poison control center or emergency room at once. NOTE: This medicine is only for you. Do not share this medicine with others. What if I miss a dose? If you miss a dose, take it as soon as you can. If it is almost time for your next dose, take only that dose. Do not take double or extra doses. What may interact with this medicine? -alcohol -aspirin -cidofovir -diuretics -lithium -methotrexate -other drugs for inflammation like ketorolac or prednisone -pemetrexed -probenecid -warfarin This list may not describe all possible interactions. Give your health care provider a list of all the medicines, herbs, non-prescription drugs, or dietary supplements you use. Also tell them if you smoke, drink alcohol, or use illegal drugs. Some items may interact with your medicine. What should I watch for while using this medicine? Tell your doctor or health care professional if your pain does not get better. Talk to your doctor before taking another medicine for pain. Do  not treat yourself. This medicine does not prevent heart attack or stroke. In fact, this medicine may increase the chance of a heart attack or stroke. The chance may increase with longer use of this medicine and in people who have heart disease. If you take aspirin to prevent heart attack or stroke, talk with your doctor or health care professional. Do not take other medicines that contain aspirin, ibuprofen, or naproxen with this medicine. Side effects such as stomach upset, nausea, or ulcers may be more likely to occur. Many medicines available without a prescription should not be taken with this medicine. This medicine can cause ulcers and bleeding  in the stomach and intestines at any time during treatment. Do not smoke cigarettes or drink alcohol. These increase irritation to your stomach and can make it more susceptible to damage from this medicine. Ulcers and bleeding can happen without warning symptoms and can cause death. You may get drowsy or dizzy. Do not drive, use machinery, or do anything that needs mental alertness until you know how this medicine affects you. Do not stand or sit up quickly, especially if you are an older patient. This reduces the risk of dizzy or fainting spells. This medicine can cause you to bleed more easily. Try to avoid damage to your teeth and gums when you brush or floss your teeth. What side effects may I notice from receiving this medicine? Side effects that you should report to your doctor or health care professional as soon as possible: -black or bloody stools, blood in the urine or vomit -blurred vision -chest pain -difficulty breathing or wheezing -nausea or vomiting -severe stomach pain -skin rash, skin redness, blistering or peeling skin, hives, or itching -slurred speech or weakness on one side of the body -swelling of eyelids, throat, lips -unexplained weight gain or swelling -unusually weak or tired -yellowing of eyes or skin Side effects that usually do not require medical attention (report to your doctor or health care professional if they continue or are bothersome): -constipation -headache -heartburn This list may not describe all possible side effects. Call your doctor for medical advice about side effects. You may report side effects to FDA at 1-800-FDA-1088. Where should I keep my medicine? Keep out of the reach of children. Store at room temperature between 15 and 30 degrees C (59 and 86 degrees F). Keep container tightly closed. Throw away any unused medicine after the expiration date. NOTE: This sheet is a summary. It may not cover all possible information. If you have questions  about this medicine, talk to your doctor, pharmacist, or health care provider.    2016, Elsevier/Gold Standard. (2009-06-26 20:10:16)  Orphenadrine tablets What is this medicine? ORPHENADRINE (or FEN a dreen) helps to relieve pain and stiffness in muscles and can treat muscle spasms. This medicine may be used for other purposes; ask your health care provider or pharmacist if you have questions. What should I tell my health care provider before I take this medicine? They need to know if you have any of these conditions: -glaucoma -heart disease -kidney disease -myasthenia gravis -peptic ulcer disease -prostate disease -stomach problems -an unusual or allergic reaction to orphenadrine, other medicines, foods, lactose, dyes, or preservatives -pregnant or trying to get pregnant -breast-feeding How should I use this medicine? Take this medicine by mouth with a full glass of water. Follow the directions on the prescription label. Take your medicine at regular intervals. Do not take your medicine more often than directed. Do not take more  than you are told to take. Talk to your pediatrician regarding the use of this medicine in children. Special care may be needed. Patients over 67 years old may have a stronger reaction and need a smaller dose. Overdosage: If you think you have taken too much of this medicine contact a poison control center or emergency room at once. NOTE: This medicine is only for you. Do not share this medicine with others. What if I miss a dose? If you miss a dose, take it as soon as you can. If it is almost time for your next dose, take only that dose. Do not take double or extra doses. What may interact with this medicine? -alcohol -antihistamines -barbiturates, like phenobarbital -benzodiazepines -cyclobenzaprine -medicines for pain -phenothiazines like chlorpromazine, mesoridazine, prochlorperazine, thioridazine This list may not describe all possible  interactions. Give your health care provider a list of all the medicines, herbs, non-prescription drugs, or dietary supplements you use. Also tell them if you smoke, drink alcohol, or use illegal drugs. Some items may interact with your medicine. What should I watch for while using this medicine? Your mouth may get dry. Chewing sugarless gum or sucking hard candy, and drinking plenty of water may help. Contact your doctor if the problem does not go away or is severe. This medicine may cause dry eyes and blurred vision. If you wear contact lenses you may feel some discomfort. Lubricating drops may help. See your eye doctor if the problem does not go away or is severe. You may get drowsy or dizzy. Do not drive, use machinery, or do anything that needs mental alertness until you know how this medicine affects you. Do not stand or sit up quickly, especially if you are an older patient. This reduces the risk of dizzy or fainting spells. Alcohol may interfere with the effect of this medicine. Avoid alcoholic drinks. What side effects may I notice from receiving this medicine? Side effects that you should report to your doctor or health care professional as soon as possible: -allergic reactions like skin rash, itching or hives, swelling of the face, lips, or tongue -changes in vision -difficulty breathing -fast heartbeat or palpitations -hallucinations -light headedness, fainting spells -vomiting Side effects that usually do not require medical attention (report to your doctor or health care professional if they continue or are bothersome): -dizziness -drowsiness -headache -nausea This list may not describe all possible side effects. Call your doctor for medical advice about side effects. You may report side effects to FDA at 1-800-FDA-1088. Where should I keep my medicine? Keep out of the reach of children. This medicine may cause accidental overdose and death if it taken by other adults, children,  or pets. Mix any unused medicine with a substance like cat litter or coffee grounds. Then throw the medicine away in a sealed container like a sealed bag or a coffee can with a lid. Do not use the medicine after the expiration date. Store at room temperature between 15 and 30 degrees C (59 and 86 degrees F). NOTE: This sheet is a summary. It may not cover all possible information. If you have questions about this medicine, talk to your doctor, pharmacist, or health care provider.    2016, Elsevier/Gold Standard. (2013-08-20 15:35:08)  Acetaminophen; Oxycodone tablets What is this medicine? ACETAMINOPHEN; OXYCODONE (a set a MEE noe fen; ox i KOE done) is a pain reliever. It is used to treat moderate to severe pain. This medicine may be used for other purposes; ask your health  care provider or pharmacist if you have questions. What should I tell my health care provider before I take this medicine? They need to know if you have any of these conditions: -brain tumor -Crohn's disease, inflammatory bowel disease, or ulcerative colitis -drug abuse or addiction -head injury -heart or circulation problems -if you often drink alcohol -kidney disease or problems going to the bathroom -liver disease -lung disease, asthma, or breathing problems -an unusual or allergic reaction to acetaminophen, oxycodone, other opioid analgesics, other medicines, foods, dyes, or preservatives -pregnant or trying to get pregnant -breast-feeding How should I use this medicine? Take this medicine by mouth with a full glass of water. Follow the directions on the prescription label. You can take it with or without food. If it upsets your stomach, take it with food. Take your medicine at regular intervals. Do not take it more often than directed. Talk to your pediatrician regarding the use of this medicine in children. Special care may be needed. Patients over 66 years old may have a stronger reaction and need a smaller  dose. Overdosage: If you think you have taken too much of this medicine contact a poison control center or emergency room at once. NOTE: This medicine is only for you. Do not share this medicine with others. What if I miss a dose? If you miss a dose, take it as soon as you can. If it is almost time for your next dose, take only that dose. Do not take double or extra doses. What may interact with this medicine? -alcohol -antihistamines -barbiturates like amobarbital, butalbital, butabarbital, methohexital, pentobarbital, phenobarbital, thiopental, and secobarbital -benztropine -drugs for bladder problems like solifenacin, trospium, oxybutynin, tolterodine, hyoscyamine, and methscopolamine -drugs for breathing problems like ipratropium and tiotropium -drugs for certain stomach or intestine problems like propantheline, homatropine methylbromide, glycopyrrolate, atropine, belladonna, and dicyclomine -general anesthetics like etomidate, ketamine, nitrous oxide, propofol, desflurane, enflurane, halothane, isoflurane, and sevoflurane -medicines for depression, anxiety, or psychotic disturbances -medicines for sleep -muscle relaxants -naltrexone -narcotic medicines (opiates) for pain -phenothiazines like perphenazine, thioridazine, chlorpromazine, mesoridazine, fluphenazine, prochlorperazine, promazine, and trifluoperazine -scopolamine -tramadol -trihexyphenidyl This list may not describe all possible interactions. Give your health care provider a list of all the medicines, herbs, non-prescription drugs, or dietary supplements you use. Also tell them if you smoke, drink alcohol, or use illegal drugs. Some items may interact with your medicine. What should I watch for while using this medicine? Tell your doctor or health care professional if your pain does not go away, if it gets worse, or if you have new or a different type of pain. You may develop tolerance to the medicine. Tolerance means that you  will need a higher dose of the medication for pain relief. Tolerance is normal and is expected if you take this medicine for a long time. Do not suddenly stop taking your medicine because you may develop a severe reaction. Your body becomes used to the medicine. This does NOT mean you are addicted. Addiction is a behavior related to getting and using a drug for a non-medical reason. If you have pain, you have a medical reason to take pain medicine. Your doctor will tell you how much medicine to take. If your doctor wants you to stop the medicine, the dose will be slowly lowered over time to avoid any side effects. You may get drowsy or dizzy. Do not drive, use machinery, or do anything that needs mental alertness until you know how this medicine affects you. Do not stand  or sit up quickly, especially if you are an older patient. This reduces the risk of dizzy or fainting spells. Alcohol may interfere with the effect of this medicine. Avoid alcoholic drinks. There are different types of narcotic medicines (opiates) for pain. If you take more than one type at the same time, you may have more side effects. Give your health care provider a list of all medicines you use. Your doctor will tell you how much medicine to take. Do not take more medicine than directed. Call emergency for help if you have problems breathing. The medicine will cause constipation. Try to have a bowel movement at least every 2 to 3 days. If you do not have a bowel movement for 3 days, call your doctor or health care professional. Do not take Tylenol (acetaminophen) or medicines that have acetaminophen with this medicine. Too much acetaminophen can be very dangerous. Many nonprescription medicines contain acetaminophen. Always read the labels carefully to avoid taking more acetaminophen. What side effects may I notice from receiving this medicine? Side effects that you should report to your doctor or health care professional as soon as  possible: -allergic reactions like skin rash, itching or hives, swelling of the face, lips, or tongue -breathing difficulties, wheezing -confusion -light headedness or fainting spells -severe stomach pain -unusually weak or tired -yellowing of the skin or the whites of the eyes Side effects that usually do not require medical attention (report to your doctor or health care professional if they continue or are bothersome): -dizziness -drowsiness -nausea -vomiting This list may not describe all possible side effects. Call your doctor for medical advice about side effects. You may report side effects to FDA at 1-800-FDA-1088. Where should I keep my medicine? Keep out of the reach of children. This medicine can be abused. Keep your medicine in a safe place to protect it from theft. Do not share this medicine with anyone. Selling or giving away this medicine is dangerous and against the law. This medicine may cause accidental overdose and death if it taken by other adults, children, or pets. Mix any unused medicine with a substance like cat litter or coffee grounds. Then throw the medicine away in a sealed container like a sealed bag or a coffee can with a lid. Do not use the medicine after the expiration date. Store at room temperature between 20 and 25 degrees C (68 and 77 degrees F). NOTE: This sheet is a summary. It may not cover all possible information. If you have questions about this medicine, talk to your doctor, pharmacist, or health care provider.    2016, Elsevier/Gold Standard. (2014-05-25 15:18:46)

## 2016-01-08 NOTE — ED Notes (Signed)
Verbalized understanding discharge instructions, prescriptions, and follow-up. In no acute distress.   

## 2016-01-08 NOTE — ED Notes (Signed)
Pt noted to walked to restroom w/o assistance or difficulty.

## 2016-01-08 NOTE — Telephone Encounter (Signed)
Patient Name: [name]  DOB: [birth year]    Initial Comment Caller states she's having back and hip pain. Leg pain. Horrible pain.   Nurse Assessment  Nurse: Raphael Gibney, RN, [facility]les Date/Time (Eastern Time): 01/08/2016 8:15:50 AM  Confirm and document reason for call. If symptomatic, describe symptoms. You must click the next button to save text entered. ---Caller states she is having back and left hip pain. Pain in left lower back. Pain level 10+. Pain started on Thursday.  Has the patient traveled out of the country within the last 30 days? ---Not Applicable  Does the patient have any new or worsening symptoms? ---Yes  Will a triage be completed? ---Yes  Related visit to physician within the last 2 weeks? ---No  Does the PT have any chronic conditions? (i.e. diabetes, asthma, etc.) ---Yes  List chronic conditions. ---diabetes, recently finished radiation  Is this a behavioral health or substance abuse call? ---No     Guidelines    Guideline Title Affirmed Question Affirmed Notes  Flank Pain [1] SEVERE pain (e.g., excruciating, scale 8-10) AND [2] present > 1 hour    Final Disposition User   Go to ED Now Raphael Gibney, RN, [facility] - ED   Disagree/Comply: Comply

## 2016-01-08 NOTE — ED Provider Notes (Signed)
CSN: [id number]     Arrival date & time 01/08/16  0941 History   First MD Initiated Contact with Patient 01/08/16 [phone]     Chief Complaint  Patient presents with  . Back Pain  . Hip Pain     (Consider location/radiation/quality/duration/timing/severity/associated sxs/prior Treatment) Patient is a 82 y.o. female presenting with back pain and hip pain. The history is provided by the patient.  Back Pain Hip Pain  She complains of pain in the posterior aspect of the left hip for the last 4 days. She asked she points to the region of the posterior iliac crest. Pain is a dull but which he rates at 10/10. It is worse when laying down, not worse with walking. Pain does not radiate. She has taken ibuprofen, acetaminophen, and hydrocodone-acetaminophen-all of which gave some degree of temporary relief. She thought she might of had a urinary tract infection so she drank some cranberry juice without any benefit. She denies dysuria, urinary urgency, urinary frequency, urinary tenesmus. She denies any weakness, numbness, tingling. She denies bowel or bladder dysfunction. She denies any recent trauma. He is a breast cancer patient having recently completed radiation therapy for stage I cancer of the right breast.   Past Medical History  Diagnosis Date  . Arthritis   . Diabetes mellitus   . Colon polyps     adenomatous  . Diverticulosis   . Hemorrhoids   . Dyslipidemia   . GERD (gastroesophageal reflux disease)   . Renal insufficiency   . DDD (degenerative disc disease)   . Shingles   . Breast cancer of upper-outer quadrant of right female breast ([place]) 07/21/2015  . Breast cancer ([place])   . Peripheral edema     ankles, takes lasix  . PONV (postoperative nausea and vomiting)   . Family history of breast cancer   . Family history of prostate cancer   . Family history of pancreatic cancer   . Radiation 09/13/15-10/12/15    42.72 gray to right breast with lumpectomy cavity boost of 12 gray   Past  Surgical History  Procedure Laterality Date  . Total shoulder arthroplasty Right 2010  . Total knee arthroplasty Right 2003  . Total knee arthroplasty Left 2010  . Umbilical hernia repair  1958  . Vaginal hysterectomy  1975  . Carpal tunnel release Right 1977  . Cataract extraction w/ intraocular lens  implant, bilateral Bilateral     left 1996; right 1997  . Ganglion cyst excision Right 2003    hand  . Joint replacement    . Breast lumpectomy with radioactive seed localization Right 08/03/2015    Procedure: BREAST LUMPECTOMY WITH RADIOACTIVE SEED LOCALIZATION;  Surgeon: [name], MD;  Location: [facility];  Service: General;  Laterality: Right;   Family History  Problem Relation Age of Onset  . Colon cancer Neg Hx   . Stomach cancer Neg Hx   . Coronary artery disease Brother     MI in his 35s  . Pancreatic cancer Mother 41  . Diabetes Mother   . Prostate cancer Brother   . Diabetes Father   . Heart disease Father   . Diabetes Brother   . Heart attack Brother   . Diabetes Sister   . Diabetes Daughter   . Diabetes Son    Social History  Substance Use Topics  . Smoking status: Never Smoker   . Smokeless tobacco: Never Used  . Alcohol Use: No   OB History    No  data available     Review of Systems  Musculoskeletal: Positive for back pain.  All other systems reviewed and are negative.     Allergies  Review of patient's allergies indicates no known allergies.  Home Medications   Prior to Admission medications   Medication Sig Start Date End Date Taking? Authorizing Provider  amoxicillin (AMOXIL) 875 MG tablet Take 1 tablet (875 mg total) by mouth 2 (two) times daily. 12/15/15   Leandrew Koyanagi, MD  aspirin 81 MG chewable tablet Chew 81 mg by mouth daily.      Historical Provider, MD  atorvastatin (LIPITOR) 10 MG tablet Take 1 tablet (10 mg total) by mouth daily. 05/01/15   Burnis Medin, MD  benzonatate (TESSALON PERLES) 100 MG capsule  Take 1 capsule (100 mg total) by mouth 3 (three) times daily as needed for cough. 12/15/15   Leandrew Koyanagi, MD  Blood Glucose Calibration (OT ULTRA/FASTTK CNTRL SOLN) SOLN Use as instructed 12/23/12   Burnis Medin, MD  calcium citrate-vitamin D (CITRACAL+D) 315-200 MG-UNIT tablet Take 1 tablet by mouth 2 (two) times daily.    Historical Provider, MD  Cholecalciferol (VITAMIN D3) 2000 UNITS TABS Take by mouth. Taking 3 times weekly    Historical Provider, MD  Cyanocobalamin (VITAMIN B-12 PO) Take 1 tablet by mouth daily.    Historical Provider, MD  esomeprazole (NEXIUM) 40 MG capsule TAKE 1 CAPSULE DAILY BEFOREBREAKFAST Patient taking differently: TAKE 1 CAPSULE every other day. 05/01/15   Burnis Medin, MD  furosemide (LASIX) 40 MG tablet TAKE 1 TABLET DAILY FOR    EDEMA Patient taking differently: Take 20 mg by mouth daily.  05/01/15   Burnis Medin, MD  gabapentin (NEURONTIN) 300 MG capsule TAKE 1 CAPSULE(300 MG) BY MOUTH AT BEDTIME 08/24/15   Burnis Medin, MD  glipiZIDE (GLUCOTROL XL) 5 MG 24 hr tablet Take 1 tablet (5 mg total) by mouth daily. 05/01/15   Burnis Medin, MD  glucose blood (ONETOUCH VERIO) test strip Use to test blood sugar twice daily 05/01/15   Burnis Medin, MD  ONE [facility] LANCETS MISC Use to test blood sugar twice daily 05/01/15   Burnis Medin, MD  triamcinolone cream (KENALOG) 0.1 % Apply 1 application topically 2 (two) times daily. 12/15/15   Leandrew Koyanagi, MD  vitamin C (ASCORBIC ACID) 500 MG tablet Take 500 mg by mouth daily. Reported on 12/21/2015    Historical Provider, MD   BP 162/70 mmHg  Pulse 101  Temp(Src) 97.8 F (36.6 C) (Oral)  Resp 16  SpO2 97% Physical Exam  Nursing note and vitals reviewed.  82 year old female, resting comfortably and in no acute distress. Vital signs are significant for borderline tachycardia, and also for hypertension. Oxygen saturation is 97%, which is normal. Head is normocephalic and atraumatic. PERRLA, EOMI.  Oropharynx is clear. Neck is nontender and supple without adenopathy or JVD. Back is nontender and there is no CVA tenderness. There is no tenderness over the posterior pelvis-even over the area where her pain is located. Straight leg raise is positive on the left at 45, negative on the right. Lungs are clear without rales, wheezes, or rhonchi. Chest is nontender. Heart has regular rate and rhythm without murmur. Abdomen is soft, flat, nontender without masses or hepatosplenomegaly and peristalsis is normoactive. Aortic pulsation not palpable, no bruit present. Extremities have no cyanosis or edema, full range of motion is present. Skin is warm and dry without rash. Neurologic:  Mental status is normal, cranial nerves are intact, there are no motor or sensory deficits.  ED Course  Procedures (including critical care time) Labs Review Results for orders placed or performed during the hospital encounter of 01/08/16  Urinalysis, Routine w reflex microscopic  Result Value Ref Range   Color, Urine YELLOW YELLOW   APPearance CLOUDY (A) CLEAR   Specific Gravity, Urine 1.016 1.005 - 1.030   pH 6.0 5.0 - 8.0   Glucose, UA 250 (A) NEGATIVE mg/dL   Hgb urine dipstick NEGATIVE NEGATIVE   Bilirubin Urine NEGATIVE NEGATIVE   Ketones, ur NEGATIVE NEGATIVE mg/dL   Protein, ur NEGATIVE NEGATIVE mg/dL   Nitrite NEGATIVE NEGATIVE   Leukocytes, UA SMALL (A) NEGATIVE  Urine microscopic-add on  Result Value Ref Range   Squamous Epithelial / LPF 6-30 (A) NONE SEEN   WBC, UA 0-5 0 - 5 WBC/hpf   RBC / HPF NONE SEEN 0 - 5 RBC/hpf   Bacteria, UA FEW (A) NONE SEEN   Imaging Review US Aorta  01/08/2016  CLINICAL DATA:  Five-day history of back pain. EXAM: ULTRASOUND OF ABDOMINAL AORTA TECHNIQUE: Ultrasound examination of the abdominal aorta was performed to evaluate for abdominal aortic aneurysm. COMPARISON:  None. FINDINGS: Abdominal Aorta Proximal: 2.1 cm Mid:  1.9 cm Distal: 1.6 cm Right Common iliac  artery:  1.3 cm Left Common iliac artery: 1.3 cm Moderate atherosclerotic calcifications are demonstrated. IMPRESSION: Atherosclerotic calcifications but no focal aneurysm. Electronically Signed   By: Marijo Sanes M.D.   On: 01/08/2016 11:38   I have personally reviewed and evaluated these images and lab results as part of my medical decision-making.   MDM   Final diagnoses:  Sciatica, left    Left sacral pain. This is felt to be most likely due to sciatic pain. I reviewed her old records confirming recent radiation treatment for breast cancer. She had lumbar spine x-rays done 2 years ago which showed degenerative changes. Aorta was noted to be calcified without any obvious aneurysm. However, there has been no actual imaging of the aorta such as ultrasound or CT scan that I could find on the records. She'll be sent for ultrasound of the aorta to rule out aneurysm and urinalysis will be obtained to rule out urinary tract infection. She is given dose of ibuprofen, cyclobenzaprine, and oxycodone-acetaminophen.  She feels significantly better with above noted treatment. No evidence of aortic aneurysm on ultrasound. No evidence of urinary tract infection. Patient was concerned that she should have an MRI to find out the cause of her symptoms. Rationale for not getting MRI today was explained. She is discharged with prescriptions for naproxen, orphenadrine, and oxycodone-acetaminophen. She has an appointment with her PCP in 2 days and is to keep that appointment.  Delora Fuel, MD [id number]

## 2016-01-08 NOTE — ED Notes (Addendum)
Pt c/o L lower back pain radiating into L hip.  Denies injury.  Denies weakness.  Lying increases pain.  Pt reports chronic low back pain, but sts "this feels different."  Pt concerned about an UTI.  Denies dysuria or frequency.  Sts this pain "feels different" from previous UTIs, as well.

## 2016-01-08 NOTE — Telephone Encounter (Signed)
Noted. Patient did arrive to ED

## 2016-01-08 NOTE — ED Notes (Signed)
Pt reports lower back pain and L hip pain for the past 4 days. Denies dysuria or hematuria, but does have hx of UTI. No recent chemo/radiation Ambulatory to rooms

## 2016-01-09 NOTE — Progress Notes (Signed)
Pre visit review using our clinic review tool, if applicable. No additional management support is needed unless otherwise documented below in the visit note.  Chief Complaint  Patient presents with  . Follow-up    HPI: [name] 82 y.o.  A lady w type 2 dm , ht recent dx breast cancer  Renal  insufficincy  Arthritis  Who comes in with husband who also has a visit   For fu  She also  was seen in ed July 3rd  For back and hip pain  New onset  Different than other pains  Without trauma or fall. Newer ppain .  Left sitting and laying makes it work .    Hard to turn over in bed.  Stays cold .  Pain   Took pills   This am    About a 4  .  12  Bad   Ed felt not related to cv system and ms cause  And to fu op  She did have iv rx  And neg  abd [place] see below  New back pain .     Sciatica, left    Left sacral pain. This is felt to be most likely due to sciatic pain. I reviewed her old records confirming recent radiation treatment for breast cancer. She had lumbar spine x-rays done 2 years ago which showed degenerative changes. Aorta was noted to be calcified without any obvious aneurysm. However, there has been no actual imaging of the aorta such as ultrasound or CT scan that I could find on the records. She'll be sent for ultrasound of the aorta to rule out aneurysm and urinalysis will be obtained to rule out urinary tract infection. She is given dose of ibuprofen, cyclobenzaprine, and oxycodone-acetaminophen.  She feels significantly better with above noted treatment. No evidence of aortic aneurysm on ultrasound. No evidence of urinary tract infection. Patient was concerned that she should have an MRI to find out the cause of her symptoms. Rationale for not getting MRI today was explained. She is discharged with prescriptions for naproxen, orphenadrine, and oxycodone-acetaminophen. She has an appointment with her PCP in 2 days and is to keep that appointment.  Delora Fuel, MD [id number]        Fu dm since last visit  Doing better no lows no change in meds    no new numbness vision change  cv  No cp sob  Neuro sx  Syncope  aviods sweets Is taking 300 gaba at night  ROS: See pertinent positives and negatives per HPI.  Past Medical History  Diagnosis Date  . Arthritis   . Diabetes mellitus   . Colon polyps     adenomatous  . Diverticulosis   . Hemorrhoids   . Dyslipidemia   . GERD (gastroesophageal reflux disease)   . Renal insufficiency   . DDD (degenerative disc disease)   . Shingles   . Breast cancer of upper-outer quadrant of right female breast ([place]) 07/21/2015  . Breast cancer ([place])   . Peripheral edema     ankles, takes lasix  . PONV (postoperative nausea and vomiting)   . Family history of breast cancer   . Family history of prostate cancer   . Family history of pancreatic cancer   . Radiation 09/13/15-10/12/15    42.72 gray to right breast with lumpectomy cavity boost of 12 gray    Family History  Problem Relation Age of Onset  . Colon cancer Neg Hx   .  Stomach cancer Neg Hx   . Coronary artery disease Brother     MI in his 53s  . Pancreatic cancer Mother 77  . Diabetes Mother   . Prostate cancer Brother   . Diabetes Father   . Heart disease Father   . Diabetes Brother   . Heart attack Brother   . Diabetes Sister   . Diabetes Daughter   . Diabetes Son     Social History   Social History  . Marital Status: Married    Spouse Name: N/A  . Number of Children: 2  . Years of Education: N/A   Occupational History  . Retired    Social History Main Topics  . Smoking status: Never Smoker   . Smokeless tobacco: Never Used  . Alcohol Use: No  . Drug Use: No  . Sexual Activity: No   Other Topics Concern  . None   Social History Narrative   HH 2 married   Works for [employer] for 40 years retired   No pets     Outpatient Prescriptions Prior to Visit  Medication Sig Dispense Refill  . aspirin 81 MG chewable tablet Chew 81 mg by mouth  daily.      Marland Kitchen atorvastatin (LIPITOR) 10 MG tablet Take 1 tablet (10 mg total) by mouth daily. 90 tablet 3  . Blood Glucose Calibration (OT ULTRA/FASTTK CNTRL SOLN) SOLN Use as instructed 1 each 11  . calcium citrate-vitamin D (CITRACAL+D) 315-200 MG-UNIT tablet Take 1 tablet by mouth 2 (two) times daily.    Marland Kitchen esomeprazole (NEXIUM) 40 MG capsule TAKE 1 CAPSULE DAILY BEFOREBREAKFAST (Patient taking differently: TAKE 1 CAPSULE every other day.) 90 capsule 3  . furosemide (LASIX) 40 MG tablet TAKE 1 TABLET DAILY FOR    EDEMA (Patient taking differently: Take 20 mg by mouth daily. ) 90 tablet 3  . gabapentin (NEURONTIN) 300 MG capsule TAKE 1 CAPSULE(300 MG) BY MOUTH AT BEDTIME 30 capsule 5  . glipiZIDE (GLUCOTROL XL) 5 MG 24 hr tablet Take 1 tablet (5 mg total) by mouth daily. 90 tablet 3  . glucose blood (ONETOUCH VERIO) test strip Use to test blood sugar twice daily 300 each 3  . naproxen (NAPROSYN) 375 MG tablet Take 1 tablet (375 mg total) by mouth 2 (two) times daily. 30 tablet 0  . ONE TOUCH LANCETS MISC Use to test blood sugar twice daily 600 each 3  . orphenadrine (NORFLEX) 100 MG tablet Take 1 tablet (100 mg total) by mouth 2 (two) times daily. 30 tablet 0  . oxyCODONE-acetaminophen (PERCOCET) 5-325 MG tablet Take 1 tablet by mouth every 4 (four) hours as needed for moderate pain. 20 tablet 0   No facility-administered medications prior to visit.     EXAM:  BP 116/58 mmHg  Temp(Src) 98 F (36.7 C) (Oral)  Ht 5' 1.5" (1.562 m)  Wt 161 lb 3.2 oz (73.12 kg)  BMI 29.97 kg/m2  Body mass index is 29.97 kg/(m^2).  GENERAL: vitals reviewed and listed above, alert, oriented, appears well hydrated and in no acute distress HEENT: atraumatic, conjunctiva  clear, no obvious abnormalities on inspection of external nose and ears e  NECK: no obvious masses on inspection palpation  LUNGS: clear to auscultation bilaterally, no wheezes, rales or rhonchi, good air movement CV: HRRR, no clubbing  cyanosis or  peripheral edema nl cap refill  Walking fairly normla points to the left lower back area  withtout limp  No rash noted  No foca weakness of  legs  MS: moves all extremities without noticeable focal  Abnormality has arthritis  PSYCH: pleasant and cooperative, no obvious depression or anxiety Lab Results  Component Value Date   WBC 5.0 07/26/2015   HGB 12.5 07/26/2015   HCT 37.8 07/26/2015   PLT 230 07/26/2015   GLUCOSE 173* 01/03/2016   CHOL 170 05/01/2015   TRIG 242.0* 05/01/2015   HDL 36.20* 05/01/2015   LDLDIRECT 85.0 05/01/2015   LDLCALC 83 12/16/2013   ALT 13 07/26/2015   AST 14 07/26/2015   NA 139 01/03/2016   K 3.8 01/03/2016   CL 103 01/03/2016   CREATININE 1.24* 01/03/2016   BUN 26* 01/03/2016   CO2 33* 01/03/2016   TSH 2.06 05/01/2015   INR 1.0 02/15/2009   HGBA1C 7.3* 01/03/2016   MICROALBUR <0.2 12/27/2014   Ed visit lab reviewed  ASSESSMENT AND PLAN:  Discussed the following assessment and plan:  Left-sided low back pain without sciatica - Plan: DG Lumbar Spine Complete  Renal insufficiency  Malignant neoplasm of right female breast, unspecified site of breast ([place]) - Plan: DG Lumbar Spine Complete  Controlled type 2 diabetes mellitus without complication, without long-term current use of insulin ([facility]) a1 c better   Continue meds na lsi    No change  lbp sound mechanical   Hx of breast cancer  Otherwise not alarming  Get plain x ray   And if no alarm findings rx conservatively and fu if  persistent or progressive 85month or worse etc .   See below .  Total visit 67mins > 50% spent counseling and coordinating care as indicated in above note and in instructions to patient .  -Patient advised to return or notify health care team  if symptoms worsen ,persist or new concerns arise.  Patient Instructions    We will get a plain x ray to check the bones and joint space .   Most back pain is from arthritis or    Pinched nerve or mechanical problem and  often subside in 2 weeks however this is new to you and we need a closer follow up . We may do more evaluation  And or see   Ortho at some point.  No obvious infections Your diabetes and kidney function is stable  No change in medication  Could take  Gabapentin in the day .      Standley Brooking. Ajahnae Rathgeber M.D.

## 2016-01-10 ENCOUNTER — Encounter: Payer: Self-pay | Admitting: Internal Medicine

## 2016-01-10 ENCOUNTER — Ambulatory Visit (INDEPENDENT_AMBULATORY_CARE_PROVIDER_SITE_OTHER)
Admission: RE | Admit: 2016-01-10 | Discharge: 2016-01-10 | Disposition: A | Discharge status: Home / Self Care | Payer: Medicare Other | Source: Ambulatory Visit | Attending: Internal Medicine | Admitting: Internal Medicine

## 2016-01-10 ENCOUNTER — Ambulatory Visit (INDEPENDENT_AMBULATORY_CARE_PROVIDER_SITE_OTHER): Payer: Medicare Other | Admitting: Internal Medicine

## 2016-01-10 VITALS — BP 116/58 | Temp 98.0°F | Ht 61.5 in | Wt 161.2 lb

## 2016-01-10 DIAGNOSIS — C50911 Malignant neoplasm of unspecified site of right female breast: Secondary | ICD-10-CM

## 2016-01-10 DIAGNOSIS — M545 Low back pain, unspecified: Secondary | ICD-10-CM

## 2016-01-10 DIAGNOSIS — E119 Type 2 diabetes mellitus without complications: Secondary | ICD-10-CM

## 2016-01-10 DIAGNOSIS — N289 Disorder of kidney and ureter, unspecified: Secondary | ICD-10-CM | POA: Diagnosis not present

## 2016-01-10 DIAGNOSIS — M47816 Spondylosis without myelopathy or radiculopathy, lumbar region: Secondary | ICD-10-CM | POA: Diagnosis not present

## 2016-01-10 NOTE — Patient Instructions (Addendum)
   We will get a plain x ray to check the bones and joint space .   Most back pain is from arthritis or    Pinched nerve or mechanical problem and often subside in 2 weeks however this is new to you and we need a closer follow up . We may do more evaluation  And or see   Ortho at some point.  No obvious infections Your diabetes and kidney function is stable  No change in medication  Could take  Gabapentin in the day .

## 2016-01-11 DIAGNOSIS — C50911 Malignant neoplasm of unspecified site of right female breast: Secondary | ICD-10-CM | POA: Insufficient documentation

## 2016-01-11 DIAGNOSIS — E119 Type 2 diabetes mellitus without complications: Secondary | ICD-10-CM | POA: Insufficient documentation

## 2016-01-15 ENCOUNTER — Telehealth: Payer: Self-pay | Admitting: Internal Medicine

## 2016-01-15 DIAGNOSIS — M545 Low back pain, unspecified: Secondary | ICD-10-CM

## 2016-01-15 NOTE — Telephone Encounter (Signed)
i agree   Specialist eval

## 2016-01-15 NOTE — Telephone Encounter (Signed)
Pt was seen on 01/10/16 and would like misty to return her call the pain med is not working

## 2016-01-15 NOTE — Telephone Encounter (Signed)
Spoke to the pt. She states back and hip pain are much worse.  Can only walk when using a pain pill.  Will do referral to Dr. Maureen Ralphs per last note.

## 2016-01-16 ENCOUNTER — Emergency Department (HOSPITAL_COMMUNITY): Payer: Medicare Other

## 2016-01-16 ENCOUNTER — Emergency Department (HOSPITAL_COMMUNITY)
Admission: EM | Admit: 2016-01-16 | Discharge: 2016-01-16 | Disposition: A | Discharge status: Home / Self Care | Payer: Medicare Other | Attending: Emergency Medicine | Admitting: Emergency Medicine

## 2016-01-16 ENCOUNTER — Encounter (HOSPITAL_COMMUNITY): Payer: Self-pay | Admitting: *Deleted

## 2016-01-16 DIAGNOSIS — Z853 Personal history of malignant neoplasm of breast: Secondary | ICD-10-CM | POA: Insufficient documentation

## 2016-01-16 DIAGNOSIS — Z79899 Other long term (current) drug therapy: Secondary | ICD-10-CM | POA: Insufficient documentation

## 2016-01-16 DIAGNOSIS — M545 Low back pain, unspecified: Secondary | ICD-10-CM

## 2016-01-16 DIAGNOSIS — S32049A Unspecified fracture of fourth lumbar vertebra, initial encounter for closed fracture: Secondary | ICD-10-CM | POA: Diagnosis not present

## 2016-01-16 DIAGNOSIS — E119 Type 2 diabetes mellitus without complications: Secondary | ICD-10-CM | POA: Diagnosis not present

## 2016-01-16 DIAGNOSIS — X58XXXA Exposure to other specified factors, initial encounter: Secondary | ICD-10-CM | POA: Diagnosis not present

## 2016-01-16 DIAGNOSIS — Y929 Unspecified place or not applicable: Secondary | ICD-10-CM | POA: Insufficient documentation

## 2016-01-16 DIAGNOSIS — Z7984 Long term (current) use of oral hypoglycemic drugs: Secondary | ICD-10-CM | POA: Diagnosis not present

## 2016-01-16 DIAGNOSIS — E785 Hyperlipidemia, unspecified: Secondary | ICD-10-CM | POA: Insufficient documentation

## 2016-01-16 DIAGNOSIS — Z7982 Long term (current) use of aspirin: Secondary | ICD-10-CM | POA: Insufficient documentation

## 2016-01-16 DIAGNOSIS — S32040A Wedge compression fracture of fourth lumbar vertebra, initial encounter for closed fracture: Secondary | ICD-10-CM | POA: Insufficient documentation

## 2016-01-16 DIAGNOSIS — M1990 Unspecified osteoarthritis, unspecified site: Secondary | ICD-10-CM | POA: Diagnosis not present

## 2016-01-16 DIAGNOSIS — M549 Dorsalgia, unspecified: Secondary | ICD-10-CM | POA: Diagnosis present

## 2016-01-16 DIAGNOSIS — S32000A Wedge compression fracture of unspecified lumbar vertebra, initial encounter for closed fracture: Secondary | ICD-10-CM

## 2016-01-16 DIAGNOSIS — Y999 Unspecified external cause status: Secondary | ICD-10-CM | POA: Diagnosis not present

## 2016-01-16 DIAGNOSIS — Y939 Activity, unspecified: Secondary | ICD-10-CM | POA: Diagnosis not present

## 2016-01-16 LAB — CBC WITH DIFFERENTIAL/PLATELET
Basophils Absolute: 0 10*3/uL (ref 0.0–0.1)
Basophils Relative: 1 %
Eosinophils Absolute: 0.2 10*3/uL (ref 0.0–0.7)
Eosinophils Relative: 3 %
HCT: 38 % (ref 36.0–46.0)
Hemoglobin: 12.4 g/dL (ref 12.0–15.0)
Lymphocytes Relative: 26 %
Lymphs Abs: 1.4 10*3/uL (ref 0.7–4.0)
MCH: 29.7 pg (ref 26.0–34.0)
MCHC: 32.6 g/dL (ref 30.0–36.0)
MCV: 91.1 fL (ref 78.0–100.0)
Monocytes Absolute: 0.4 10*3/uL (ref 0.1–1.0)
Monocytes Relative: 7 %
Neutro Abs: 3.3 10*3/uL (ref 1.7–7.7)
Neutrophils Relative %: 63 %
Platelets: 202 10*3/uL (ref 150–400)
RBC: 4.17 MIL/uL (ref 3.87–5.11)
RDW: 13.4 % (ref 11.5–15.5)
WBC: 5.1 10*3/uL (ref 4.0–10.5)

## 2016-01-16 LAB — URINALYSIS, ROUTINE W REFLEX MICROSCOPIC
Bilirubin Urine: NEGATIVE
Glucose, UA: NEGATIVE mg/dL
Hgb urine dipstick: NEGATIVE
Ketones, ur: NEGATIVE mg/dL
Nitrite: NEGATIVE
Protein, ur: NEGATIVE mg/dL
Specific Gravity, Urine: 1.018 (ref 1.005–1.030)
pH: 5.5 (ref 5.0–8.0)

## 2016-01-16 LAB — BASIC METABOLIC PANEL
Anion gap: 6 (ref 5–15)
BUN: 38 mg/dL — ABNORMAL HIGH (ref 6–20)
CO2: 28 mmol/L (ref 22–32)
Calcium: 9.7 mg/dL (ref 8.9–10.3)
Chloride: 105 mmol/L (ref 101–111)
Creatinine, Ser: 1.56 mg/dL — ABNORMAL HIGH (ref 0.44–1.00)
GFR calc Af Amer: 34 mL/min — ABNORMAL LOW (ref >60)
GFR calc non Af Amer: 30 mL/min — ABNORMAL LOW (ref >60)
Glucose, Bld: 100 mg/dL — ABNORMAL HIGH (ref 65–99)
Potassium: 4.4 mmol/L (ref 3.5–5.1)
Sodium: 139 mmol/L (ref 135–145)

## 2016-01-16 LAB — URINE MICROSCOPIC-ADD ON

## 2016-01-16 MED ORDER — OXYCODONE-ACETAMINOPHEN 5-325 MG PO TABS: 1.0000 | ORAL_TABLET | ORAL | Status: DC | PRN

## 2016-01-16 MED ORDER — OXYCODONE-ACETAMINOPHEN 5-325 MG PO TABS
1.0000 | ORAL_TABLET | Freq: Once | ORAL | Status: AC
Start: 2016-01-16 — End: 2016-01-16
  Administered 2016-01-16: 1 via ORAL
  Filled 2016-01-16: qty 1

## 2016-01-16 NOTE — Progress Notes (Signed)
Pt confirms with ED CM that she has a cane and walker at home She reports she has used [place] services previously

## 2016-01-16 NOTE — ED Notes (Signed)
Patient c/o low back pain radiating into hip x2 weeks.  Patient was seen here last week for same and was evaluated by PCP as well.  She tried to get an appointment with an orthopedist yesterday, but was unable to do so.  Patient has hx of sciatica pain on left side.  Patient states she is taking the Flexeril and Naprosyn most recently prescribed without relief.  Patient is ambulatory with a cane because of the pain.

## 2016-01-16 NOTE — ED Notes (Signed)
Pt ambulatory and independent at discharge.  

## 2016-01-16 NOTE — ED Provider Notes (Signed)
CSN: [id number]     Arrival date & time 01/16/16  [id number] History   First MD Initiated Contact with Patient 01/16/16 1152     Chief Complaint  Patient presents with  . Back Pain    left     Patient is a 82 y.o. female presenting with back pain. The history is provided by the patient. No language interpreter was used.  Back Pain  GERALDEEN MINASYAN is a 82 y.o. female who presents to the Emergency Department complaining of back pain.  She reports left low back pain that started on June 29. Pain is described as a sharp and constant pain. It is worse with lying flat and with moving/walking. She has to sleep in a recliner for rest. She has been seen for this pain previously and started on the naproxen, norflex, percocet. Her pain is continued to worsen since starting these medications and is uncontrolled today despite doubling up on her medications. She denies any fevers, vomiting, nausea, vomiting, dysuria, numbness, weakness. She has a history of stage I breast cancer and completed treatment in April of this year.  Past Medical History  Diagnosis Date  . Arthritis   . Diabetes mellitus   . Colon polyps     adenomatous  . Diverticulosis   . Hemorrhoids   . Dyslipidemia   . GERD (gastroesophageal reflux disease)   . Renal insufficiency   . DDD (degenerative disc disease)   . Shingles   . Breast cancer of upper-outer quadrant of right female breast ([place]) 07/21/2015  . Breast cancer ([place])   . Peripheral edema     ankles, takes lasix  . PONV (postoperative nausea and vomiting)   . Family history of breast cancer   . Family history of prostate cancer   . Family history of pancreatic cancer   . Radiation 09/13/15-10/12/15    42.72 gray to right breast with lumpectomy cavity boost of 12 gray   Past Surgical History  Procedure Laterality Date  . Total shoulder arthroplasty Right 2010  . Total knee arthroplasty Right 2003  . Total knee arthroplasty Left 2010  . Umbilical hernia repair  1958  .  Vaginal hysterectomy  1975  . Carpal tunnel release Right 1977  . Cataract extraction w/ intraocular lens  implant, bilateral Bilateral     left 1996; right 1997  . Ganglion cyst excision Right 2003    hand  . Joint replacement    . Breast lumpectomy with radioactive seed localization Right 08/03/2015    Procedure: BREAST LUMPECTOMY WITH RADIOACTIVE SEED LOCALIZATION;  Surgeon: [name], MD;  Location: [facility];  Service: General;  Laterality: Right;   Family History  Problem Relation Age of Onset  . Colon cancer Neg Hx   . Stomach cancer Neg Hx   . Coronary artery disease Brother     MI in his 24s  . Pancreatic cancer Mother 62  . Diabetes Mother   . Prostate cancer Brother   . Diabetes Father   . Heart disease Father   . Diabetes Brother   . Heart attack Brother   . Diabetes Sister   . Diabetes Daughter   . Diabetes Son    Social History  Substance Use Topics  . Smoking status: Never Smoker   . Smokeless tobacco: Never Used  . Alcohol Use: No   OB History    No data available     Review of Systems  Musculoskeletal: Positive for back pain.  All other  systems reviewed and are negative.     Allergies  Review of patient's allergies indicates no known allergies.  Home Medications   Prior to Admission medications   Medication Sig Start Date End Date Taking? Authorizing Provider  aspirin 81 MG chewable tablet Chew 81 mg by mouth daily.     Yes Historical Provider, MD  atorvastatin (LIPITOR) 10 MG tablet Take 1 tablet (10 mg total) by mouth daily. Patient taking differently: Take 10 mg by mouth every other day.  05/01/15  Yes Burnis Medin, MD  calcium citrate-vitamin D (CITRACAL+D) 315-200 MG-UNIT tablet Take 1 tablet by mouth 2 (two) times daily.   Yes Historical Provider, MD  esomeprazole (NEXIUM) 40 MG capsule TAKE 1 CAPSULE DAILY BEFOREBREAKFAST Patient taking differently: Take 40 mg by mouth every other day.  05/01/15  Yes Burnis Medin, MD  furosemide (LASIX) 40 MG tablet TAKE 1 TABLET DAILY FOR    EDEMA Patient taking differently: Take 20 mg by mouth daily.  05/01/15  Yes Burnis Medin, MD  glipiZIDE (GLUCOTROL XL) 5 MG 24 hr tablet Take 1 tablet (5 mg total) by mouth daily. 05/01/15  Yes Burnis Medin, MD  orphenadrine (NORFLEX) 100 MG tablet Take 1 tablet (100 mg total) by mouth 2 (two) times daily. 0000000  Yes Delora Fuel, MD  Blood Glucose Calibration (OT ULTRA/FASTTK CNTRL SOLN) SOLN Use as instructed 12/23/12   Burnis Medin, MD  gabapentin (NEURONTIN) 300 MG capsule TAKE 1 CAPSULE(300 MG) BY MOUTH AT BEDTIME 08/24/15   Burnis Medin, MD  glucose blood (ONETOUCH VERIO) test strip Use to test blood sugar twice daily 05/01/15   Burnis Medin, MD  ONE [facility] LANCETS MISC Use to test blood sugar twice daily 05/01/15   Burnis Medin, MD  oxyCODONE-acetaminophen (PERCOCET/ROXICET) 5-325 MG tablet Take 1 tablet by mouth every 4 (four) hours as needed for severe pain. 01/16/16   Quintella Reichert, MD   BP 137/53 mmHg  Pulse 79  Temp(Src) 98 F (36.7 C) (Oral)  Resp 16  SpO2 97% Physical Exam  Constitutional: She is oriented to person, place, and time. She appears well-developed and well-nourished.  HENT:  Head: Normocephalic and atraumatic.  Cardiovascular: Normal rate and regular rhythm.   No murmur heard. Pulmonary/Chest: Effort normal and breath sounds normal. No respiratory distress.  Abdominal: Soft. There is no tenderness. There is no rebound and no guarding.  Musculoskeletal: She exhibits no edema or tenderness.  Tender to palpation over the left SI joint. 2+ DP pulses.  Neurological: She is alert and oriented to person, place, and time.  5 out of 5 strength in bilateral lower extremities, sensation to light touch intact in bilateral lower extremities  Skin: Skin is warm and dry.  Psychiatric: She has a normal mood and affect. Her behavior is normal.  Nursing note and vitals reviewed.   ED Course   Procedures (including critical care time) Labs Review Labs Reviewed  BASIC METABOLIC PANEL - Abnormal; Notable for the following:    Glucose, Bld 100 (*)    BUN 38 (*)    Creatinine, Ser 1.56 (*)    GFR calc non Af Amer 30 (*)    GFR calc Af Amer 34 (*)    All other components within normal limits  URINALYSIS, ROUTINE W REFLEX MICROSCOPIC (NOT AT [facility]) - Abnormal; Notable for the following:    Leukocytes, UA TRACE (*)    All other components within normal limits  URINE MICROSCOPIC-ADD ON -  Abnormal; Notable for the following:    Squamous Epithelial / LPF 6-30 (*)    Bacteria, UA RARE (*)    Casts HYALINE CASTS (*)    All other components within normal limits  CBC WITH DIFFERENTIAL/PLATELET    Imaging Review Mr Lumbar Spine Wo Contrast  01/16/2016  CLINICAL DATA:  Low back pain 2 weeks EXAM: MRI LUMBAR SPINE WITHOUT CONTRAST TECHNIQUE: Multiplanar, multisequence MR imaging of the lumbar spine was performed. No intravenous contrast was administered. COMPARISON:  Lumbar radiographs 12/27/2014 FINDINGS: Segmentation:  Normal segmentation.  Lowest disc space L5-S1 Alignment:  Grade 1 anterior slip L3-4 L4-5. Vertebrae: Mild fracture inferior endplate of L4 with bone marrow edema and mild loss of height. This appears to be a recent fracture and was not present on the prior radiographic study. No other fracture or mass. Otherwise normal bone marrow. Conus medullaris: Extends to the mid L1 level and appears normal. Paraspinal and other soft tissues: Paraspinous muscles are symmetric and normal. Retroperitoneal structures normal. Disc levels: L1-2:  Mild disc degeneration L2-3: Mild disc degeneration. Mild disc bulging in the left foramen with mild left foraminal narrowing. Mild facet degeneration L3-4: 6 mm anterior listhesis. Diffuse disc bulging and advanced facet degeneration. Moderate to severe spinal stenosis. Severe right foraminal encroachment with right L3 nerve root compression. Mild left  foraminal narrowing. L4-5: 5 mm anterior listhesis. Diffuse disc bulging. Moderate to severe facet degeneration. Moderate spinal stenosis. Moderate foraminal encroachment bilaterally L5-S1: Mild disc and facet degeneration without significant stenosis. IMPRESSION: Mild fracture of L4 which appears recent. Moderate to severe spinal stenosis L3-4 with grade 1 anterior slip Moderate spinal stenosis C4-5 with grade 1 anterior slip. Electronically Signed   By: Franchot Gallo M.D.   On: 01/16/2016 15:40   Mr Sacrum/si Joints Wo Contrast  01/16/2016  CLINICAL DATA:  Low back pain 2 weeks. EXAM: MR SACRUM WITHOUT CONTRAST TECHNIQUE: Multiplanar, multisequence MR imaging was performed. No intravenous contrast was administered. COMPARISON:  None. FINDINGS: Compression fracture of the inferior endplate of L4 with bone marrow edema. This appears to be a recent fracture. See lumbar MRI report from today. Negative for fracture the sacrum or coccyx.  SI joints normal. Regional soft tissues are normal without mass or adenopathy. No soft tissue edema. Paraspinous muscles are symmetric and normal Abnormal signal in the medial femoral head bilaterally left greater than right most consistent with avascular necrosis versus degenerative joint disease. This is incompletely evaluated on the study. IMPRESSION: Compression fracture inferior endplate of L4 appears recent. Abnormal signal in the femoral head bilaterally consistent with avascular necrosis versus degenerative joint disease. Electronically Signed   By: Franchot Gallo M.D.   On: 01/16/2016 15:46   I have personally reviewed and evaluated these images and lab results as part of my medical decision-making.   EKG Interpretation None      MDM   Final diagnoses:  Low back pain  Lumbar compression fracture, closed, initial encounter [facility])    Patient here for evaluation of progressive low back pain. She is neurovascularly intact on examination. MRI obtained given her  history of breast cancer and progressive symptoms despite medical treatments. MRI demonstrates a mild compression fracture as well as spinal stenosis, both which can be suturing to her symptoms. She has chronic renal insufficiency. Will discontinue the naproxen and provide additional Percocet for pain control. Discussed with patient home care, orthopedics follow-up, return precautions.  Quintella Reichert, MD 01/17/16 [phone]

## 2016-01-16 NOTE — ED Notes (Signed)
Pt returned from MRI °

## 2016-01-16 NOTE — Discharge Instructions (Signed)
You have a compression fracture in your lower back as well as spinal stenosis.  Please follow up with your family doctor and orthopedic doctor.  Get rechecked immediately if you develop numbness, weakness, or new concerning symptoms.     Lumbar Fracture A lumbar fracture is a break in one of the bones of the lower back. Lumbar fractures range in severity. Severe fractures can damage the spinal cord. CAUSES This condition may be caused by:  A fall (common).  A car accident (common).  A gunshot wound.  A hard, direct hit to the back.  Osteoporosis. SYMPTOMS The main symptom of this condition is severe pain in the lower back. If a fracture is complex or severe, there may also be:  A misshapen or swollen area on the lower back.  A limited ability to move an area of the lower back.  An inability to empty the bladder or bowel.  A loss of strength or sensation in the legs, feet, and toes.  Paralysis. DIAGNOSIS This condition is diagnosed based on:  A physical exam.  Symptoms and what happened just before they developed.  The results of imaging tests, such as an X-ray, CT scan, or MRI. If your nerves have been damaged, you may also have other tests to find out how much damage there is. TREATMENT Treatment for this condition depends on the specifics of the injury. Most fractures can be treated with:  A back brace.  Bed rest and activity restrictions.  Pain medicine.  Physical therapy. Fractures that are complex, involve multiple bones, or make the spine unstable may require surgery to remove pressure from the nerves or spinal cord and to stabilize the broken pieces of bone. During recovery, it is normal to have pain and stiffness in the back for weeks. HOME CARE INSTRUCTIONS Medicines  Take medicines only as directed by your health care provider.  Do not drive or operate heavy machinery while taking pain medicine. Activity  Stay in bed for as long as directed by your  health care provider.  If you were shown how to do any exercises to improve motion and strength in your back, do them as directed by your health care provider.  Return to your normal activities as directed by your health care provider. Ask your health care provider what activities are safe for you. General Instructions  If you were given a neck brace or back brace, wear it as directed by your health care provider.  Keep all follow-up visits as directed by your health care provider. This is important. Failure to follow-up as recommended could result in permanent injury, disability, and long-lasting (chronic) pain. SEEK MEDICAL CARE IF:  Your pain does not improve over time.  You have a persistent cough.  You cannot return to your normal activities as planned or expected. SEEK IMMEDIATE MEDICAL CARE IF:  You have severe pain or your pain suddenly gets worse.  You are unable to move.  You have numbness, tingling, weakness, or paralysis in any part of your body.  You cannot control your bladder or bowel.  You have difficulty breathing.  You have a fever.  You have pain in your chest or abdomen.  You vomit.   This information is not intended to replace advice given to you by your health care provider. Make sure you discuss any questions you have with your health care provider.   Document Released: 10/09/2006 Document Revised: 11/08/2014 Document Reviewed: 06/20/2014 Elsevier Interactive Patient Education [employer].

## 2016-01-17 DIAGNOSIS — M4807 Spinal stenosis, lumbosacral region: Secondary | ICD-10-CM | POA: Diagnosis not present

## 2016-01-17 DIAGNOSIS — M7062 Trochanteric bursitis, left hip: Secondary | ICD-10-CM | POA: Diagnosis not present

## 2016-01-18 ENCOUNTER — Other Ambulatory Visit: Payer: Self-pay | Admitting: Orthopedic Surgery

## 2016-01-18 DIAGNOSIS — M4807 Spinal stenosis, lumbosacral region: Secondary | ICD-10-CM

## 2016-01-23 ENCOUNTER — Ambulatory Visit
Admission: RE | Admit: 2016-01-23 | Discharge: 2016-01-23 | Disposition: A | Discharge status: Home / Self Care | Payer: Medicare Other | Source: Ambulatory Visit | Attending: Orthopedic Surgery | Admitting: Orthopedic Surgery

## 2016-01-23 DIAGNOSIS — M4806 Spinal stenosis, lumbar region: Secondary | ICD-10-CM | POA: Diagnosis not present

## 2016-01-23 DIAGNOSIS — M4807 Spinal stenosis, lumbosacral region: Secondary | ICD-10-CM

## 2016-01-23 MED ORDER — ONDANSETRON HCL 4 MG/2ML IJ SOLN
4.0000 mg | Freq: Once | INTRAMUSCULAR | Status: AC
  Administered 2016-01-23: 4 mg via INTRAMUSCULAR

## 2016-01-23 MED ORDER — MEPERIDINE HCL 100 MG/ML IJ SOLN
50.0000 mg | Freq: Once | INTRAMUSCULAR | Status: AC
  Administered 2016-01-23: 50 mg via INTRAMUSCULAR

## 2016-01-23 MED ORDER — DIAZEPAM 5 MG PO TABS
5.0000 mg | ORAL_TABLET | Freq: Once | ORAL | Status: AC
Start: 2016-01-23 — End: 2016-01-23
  Administered 2016-01-23: 5 mg via ORAL

## 2016-01-23 MED ORDER — IOPAMIDOL (ISOVUE-M 200) INJECTION 41%
15.0000 mL | Freq: Once | INTRAMUSCULAR | Status: AC
  Administered 2016-01-23: 15 mL via INTRATHECAL

## 2016-01-23 MED ORDER — IOPAMIDOL (ISOVUE-300) INJECTION 61%: 100.0000 mL | Freq: Once | INTRAVENOUS | Status: DC | PRN

## 2016-01-23 NOTE — Discharge Instructions (Signed)

## 2016-01-30 DIAGNOSIS — M4807 Spinal stenosis, lumbosacral region: Secondary | ICD-10-CM | POA: Diagnosis not present

## 2016-02-02 ENCOUNTER — Telehealth: Payer: Self-pay | Admitting: Internal Medicine

## 2016-02-02 NOTE — Telephone Encounter (Signed)
I saw patient on July 5th    [facility] for surgery.  In no changes    Form to be completed .  Faxed . On your desk .

## 2016-02-02 NOTE — Telephone Encounter (Signed)
Received and placed on WPs desk.  Will see if pt needs a medical clearance appt.

## 2016-02-02 NOTE — Telephone Encounter (Signed)
Pt would to know if you have received a surgical clearance for her to have her back surgery with Dr Lawernce Pitts.  Pt would like have a call when ready.

## 2016-02-05 NOTE — Telephone Encounter (Signed)
Pt notified that form was filled out and faxed.

## 2016-02-12 ENCOUNTER — Other Ambulatory Visit: Payer: Self-pay | Admitting: Surgical

## 2016-02-13 ENCOUNTER — Other Ambulatory Visit: Payer: Self-pay | Admitting: Internal Medicine

## 2016-02-13 NOTE — Telephone Encounter (Signed)
Sent to the pharmacy by e-scribe. 

## 2016-02-14 DIAGNOSIS — C50911 Malignant neoplasm of unspecified site of right female breast: Secondary | ICD-10-CM | POA: Diagnosis not present

## 2016-02-23 NOTE — Patient Instructions (Addendum)
AZARAH SUTTLE  02/23/2016   Your procedure is scheduled on: 03/06/16  Report to [facility] Main  Entrance take [facility]  elevators to 3rd floor to  [place] at [place]  AM.  Call this number if you have problems the morning of surgery [phone]   Remember: ONLY 1 PERSON MAY GO WITH YOU TO SHORT STAY TO GET  READY MORNING OF [place].  Do not eat food or drink liquids :After Midnight.     Take these medicines the morning of surgery with A SIP OF WATER: Nexium May take Oxycodone if needed DO NOT TAKE ANY DIABETIC MEDICATIONS DAY OF YOUR SURGERY                               You may not have any metal on your body including hair pins and              piercings  Do not wear jewelry, make-up, lotions, powders or perfumes, deodorant             Do not wear nail polish.  Do not shave  48 hours prior to surgery.              Men may shave face and neck.   Do not bring valuables to the hospital. [place].  Contacts, dentures or bridgework may not be worn into surgery.  Leave suitcase in the car. After surgery it may be brought to your room.               [facility] - Preparing for Surgery Before surgery, you can play an important role.  Because skin is not sterile, your skin needs to be as free of germs as possible.  You can reduce the number of germs on your skin by washing with CHG (chlorahexidine gluconate) soap before surgery.  CHG is an antiseptic cleaner which kills germs and bonds with the skin to continue killing germs even after washing. Please DO NOT use if you have an allergy to CHG or antibacterial soaps.  If your skin becomes reddened/irritated stop using the CHG and inform your nurse when you arrive at Short Stay. Do not shave (including legs and underarms) for at least 48 hours prior to the first CHG shower.  You may shave your face/neck. Please follow these instructions carefully:  1.   Shower with CHG Soap the night before surgery and the  morning of Surgery.  2.  If you choose to wash your hair, wash your hair first as usual with your  normal  shampoo.  3.  After you shampoo, rinse your hair and body thoroughly to remove the  shampoo.                           4.  Use CHG as you would any other liquid soap.  You can apply chg directly  to the skin and wash                       Gently with a scrungie or clean washcloth.  5.  Apply the CHG Soap to your body ONLY FROM THE NECK DOWN.   Do  not use on face/ open                           Wound or open sores. Avoid contact with eyes, ears mouth and genitals (private parts).                       Wash face,  Genitals (private parts) with your normal soap.             6.  Wash thoroughly, paying special attention to the area where your surgery  will be performed.  7.  Thoroughly rinse your body with warm water from the neck down.  8.  DO NOT shower/wash with your normal soap after using and rinsing off  the CHG Soap.                9.  Pat yourself dry with a clean towel.            10.  Wear clean pajamas.            11.  Place clean sheets on your bed the night of your first shower and do not  sleep with pets. Day of Surgery : Do not apply any lotions/deodorants the morning of surgery.  Please wear clean clothes to the hospital/surgery center.  FAILURE TO FOLLOW THESE INSTRUCTIONS MAY RESULT IN THE CANCELLATION OF YOUR SURGERY PATIENT SIGNATURE_________________________________  NURSE SIGNATURE__________________________________  ________________________________________________________________________   Adam Phenix  An incentive spirometer is a tool that can help keep your lungs clear and active. This tool measures how well you are filling your lungs with each breath. Taking long deep breaths may help reverse or decrease the chance of developing breathing (pulmonary) problems (especially infection) following:  A long  period of time when you are unable to move or be active. BEFORE THE PROCEDURE   If the spirometer includes an indicator to show your best effort, your nurse or respiratory therapist will set it to a desired goal.  If possible, sit up straight or lean slightly forward. Try not to slouch.  Hold the incentive spirometer in an upright position. INSTRUCTIONS FOR USE  1. Sit on the edge of your bed if possible, or sit up as far as you can in bed or on a chair. 2. Hold the incentive spirometer in an upright position. 3. Breathe out normally. 4. Place the mouthpiece in your mouth and seal your lips tightly around it. 5. Breathe in slowly and as deeply as possible, raising the piston or the ball toward the top of the column. 6. Hold your breath for 3-5 seconds or for as long as possible. Allow the piston or ball to fall to the bottom of the column. 7. Remove the mouthpiece from your mouth and breathe out normally. 8. Rest for a few seconds and repeat Steps 1 through 7 at least 10 times every 1-2 hours when you are awake. Take your time and take a few normal breaths between deep breaths. 9. The spirometer may include an indicator to show your best effort. Use the indicator as a goal to work toward during each repetition. 10. After each set of 10 deep breaths, practice coughing to be sure your lungs are clear. If you have an incision (the cut made at the time of surgery), support your incision when coughing by placing a pillow or rolled up towels firmly against it. Once you are able to get out of bed,  walk around indoors and cough well. You may stop using the incentive spirometer when instructed by your caregiver.  RISKS AND COMPLICATIONS  Take your time so you do not get dizzy or light-headed.  If you are in pain, you may need to take or ask for pain medication before doing incentive spirometry. It is harder to take a deep breath if you are having pain. AFTER USE  Rest and breathe slowly and  easily.  It can be helpful to keep track of a log of your progress. Your caregiver can provide you with a simple table to help with this. If you are using the spirometer at home, follow these instructions: [place] IF:   You are having difficultly using the spirometer.  You have trouble using the spirometer as often as instructed.  Your pain medication is not giving enough relief while using the spirometer.  You develop fever of 100.5 F (38.1 C) or higher. SEEK IMMEDIATE MEDICAL CARE IF:   You cough up bloody sputum that had not been present before.  You develop fever of 102 F (38.9 C) or greater.  You develop worsening pain at or near the incision site. MAKE SURE YOU:   Understand these instructions.  Will watch your condition.  Will get help right away if you are not doing well or get worse. Document Released: 11/04/2006 Document Revised: 09/16/2011 Document Reviewed: 01/05/2007 [facility] Patient Information 2014 [place], [place].   ________________________________________________________________________

## 2016-02-23 NOTE — Progress Notes (Signed)
ekg 1/17 epic, clearance dr Regis Bill chart

## 2016-02-27 ENCOUNTER — Ambulatory Visit (HOSPITAL_COMMUNITY)
Admission: RE | Admit: 2016-02-27 | Discharge: 2016-02-27 | Disposition: A | Discharge status: Home / Self Care | Payer: Medicare Other | Source: Ambulatory Visit | Attending: Surgical | Admitting: Surgical

## 2016-02-27 ENCOUNTER — Encounter (HOSPITAL_COMMUNITY): Payer: Self-pay

## 2016-02-27 ENCOUNTER — Encounter (HOSPITAL_COMMUNITY)
Admission: RE | Admit: 2016-02-27 | Discharge: 2016-02-27 | Disposition: A | Discharge status: Home / Self Care | Payer: Medicare Other | Source: Ambulatory Visit | Attending: Orthopedic Surgery | Admitting: Orthopedic Surgery

## 2016-02-27 DIAGNOSIS — M5136 Other intervertebral disc degeneration, lumbar region: Secondary | ICD-10-CM | POA: Insufficient documentation

## 2016-02-27 DIAGNOSIS — J849 Interstitial pulmonary disease, unspecified: Secondary | ICD-10-CM | POA: Diagnosis not present

## 2016-02-27 DIAGNOSIS — Z01818 Encounter for other preprocedural examination: Secondary | ICD-10-CM | POA: Insufficient documentation

## 2016-02-27 DIAGNOSIS — I700 Atherosclerosis of aorta: Secondary | ICD-10-CM | POA: Insufficient documentation

## 2016-02-27 DIAGNOSIS — R918 Other nonspecific abnormal finding of lung field: Secondary | ICD-10-CM | POA: Diagnosis not present

## 2016-02-27 LAB — COMPREHENSIVE METABOLIC PANEL
ALT: 15 U/L (ref 14–54)
AST: 18 U/L (ref 15–41)
Albumin: 4.1 g/dL (ref 3.5–5.0)
Alkaline Phosphatase: 81 U/L (ref 38–126)
Anion gap: 7 (ref 5–15)
BUN: 23 mg/dL — ABNORMAL HIGH (ref 6–20)
CO2: 30 mmol/L (ref 22–32)
Calcium: 9.7 mg/dL (ref 8.9–10.3)
Chloride: 105 mmol/L (ref 101–111)
Creatinine, Ser: 1.21 mg/dL — ABNORMAL HIGH (ref 0.44–1.00)
GFR calc Af Amer: 46 mL/min — ABNORMAL LOW (ref >60)
GFR calc non Af Amer: 40 mL/min — ABNORMAL LOW (ref >60)
Glucose, Bld: 133 mg/dL — ABNORMAL HIGH (ref 65–99)
Potassium: 3.9 mmol/L (ref 3.5–5.1)
Sodium: 142 mmol/L (ref 135–145)
Total Bilirubin: 0.7 mg/dL (ref 0.3–1.2)
Total Protein: 7.3 g/dL (ref 6.5–8.1)

## 2016-02-27 LAB — CBC WITH DIFFERENTIAL/PLATELET
Basophils Absolute: 0 10*3/uL (ref 0.0–0.1)
Basophils Relative: 1 %
Eosinophils Absolute: 0.2 10*3/uL (ref 0.0–0.7)
Eosinophils Relative: 5 %
HCT: 35.2 % — ABNORMAL LOW (ref 36.0–46.0)
Hemoglobin: 11.4 g/dL — ABNORMAL LOW (ref 12.0–15.0)
Lymphocytes Relative: 34 %
Lymphs Abs: 1.2 10*3/uL (ref 0.7–4.0)
MCH: 29.7 pg (ref 26.0–34.0)
MCHC: 32.4 g/dL (ref 30.0–36.0)
MCV: 91.7 fL (ref 78.0–100.0)
Monocytes Absolute: 0.4 10*3/uL (ref 0.1–1.0)
Monocytes Relative: 10 %
Neutro Abs: 1.9 10*3/uL (ref 1.7–7.7)
Neutrophils Relative %: 50 %
Platelets: 205 10*3/uL (ref 150–400)
RBC: 3.84 MIL/uL — ABNORMAL LOW (ref 3.87–5.11)
RDW: 14.3 % (ref 11.5–15.5)
WBC: 3.7 10*3/uL — ABNORMAL LOW (ref 4.0–10.5)

## 2016-02-27 LAB — PROTIME-INR
INR: 1.01
Prothrombin Time: 13.3 seconds (ref 11.4–15.2)

## 2016-02-27 LAB — SURGICAL PCR SCREEN
MRSA, PCR: NEGATIVE
Staphylococcus aureus: POSITIVE — AB

## 2016-02-27 LAB — APTT: aPTT: 29 seconds (ref 24–36)

## 2016-02-28 LAB — HEMOGLOBIN A1C
Hgb A1c MFr Bld: 7 % — ABNORMAL HIGH (ref 4.8–5.6)
Mean Plasma Glucose: 154 mg/dL

## 2016-03-05 NOTE — Anesthesia Preprocedure Evaluation (Addendum)
Anesthesia Evaluation  Patient identified by MRN, date of birth, ID band Patient awake    Reviewed: Allergy & Precautions, NPO status , Patient's Chart, lab work & pertinent test results  History of Anesthesia Complications (+) PONV and history of anesthetic complications  Airway Mallampati: III  TM Distance: >3 FB Neck ROM: Full    Dental  (+) Dental Advisory Given,  Patient only has 2 teeth. Neither are loose:   Pulmonary neg pulmonary ROS, neg shortness of breath, neg sleep apnea, neg COPD, neg recent URI,    Pulmonary exam normal breath sounds clear to auscultation       Cardiovascular (-) hypertension(-) angina(-) Past MI, (-) Cardiac Stents and (-) Orthopnea + dysrhythmias (RBBB)  Rhythm:Regular Rate:Normal     Neuro/Psych neg Seizures PSYCHIATRIC DISORDERS Anxiety Depression Lumbar spinal stenosis    GI/Hepatic Neg liver ROS, GERD  Medicated,diverticulosis   Endo/Other  diabetes, Well Controlled, Type 2, Oral Hypoglycemic AgentsHypothyroidism   Renal/GU CRFRenal disease     Musculoskeletal  (+) Arthritis , Degenerative disc disease   Abdominal   Peds  Hematology  (+) Blood dyscrasia, anemia ,   Anesthesia Other Findings HLD, h/o breast cancer  Reproductive/Obstetrics                          Anesthesia Physical Anesthesia Plan  ASA: III  Anesthesia Plan: General   Post-op Pain Management:    Induction: Intravenous  Airway Management Planned: Oral ETT  Additional Equipment:   Intra-op Plan:   Post-operative Plan: Extubation in OR  Informed Consent: I have reviewed the patients History and Physical, chart, labs and discussed the procedure including the risks, benefits and alternatives for the proposed anesthesia with the patient or authorized representative who has indicated his/her understanding and acceptance.   Dental advisory given  Plan Discussed with:  CRNA  Anesthesia Plan Comments: (Risks of general anesthesia discussed including, but not limited to, sore throat, hoarse voice, chipped/damaged teeth, injury to vocal cords, nausea and vomiting, allergic reactions, blindness, lung infection, heart attack, stroke, and death. All questions answered. )       Anesthesia Quick Evaluation

## 2016-03-06 ENCOUNTER — Inpatient Hospital Stay (HOSPITAL_COMMUNITY)
Admission: RE | Admit: 2016-03-06 | Discharge: 2016-03-09 | DRG: 519 | Disposition: A | Discharge status: Home Health | Payer: Medicare Other | Source: Ambulatory Visit | Attending: Orthopedic Surgery | Admitting: Orthopedic Surgery

## 2016-03-06 ENCOUNTER — Ambulatory Visit (HOSPITAL_COMMUNITY): Payer: Medicare Other

## 2016-03-06 ENCOUNTER — Ambulatory Visit (HOSPITAL_COMMUNITY): Payer: Medicare Other | Admitting: Anesthesiology

## 2016-03-06 ENCOUNTER — Encounter (HOSPITAL_COMMUNITY)
Admission: RE | Disposition: A | Discharge status: Home Health | Payer: Self-pay | Source: Ambulatory Visit | Attending: Orthopedic Surgery

## 2016-03-06 ENCOUNTER — Encounter (HOSPITAL_COMMUNITY): Payer: Self-pay | Admitting: *Deleted

## 2016-03-06 DIAGNOSIS — E785 Hyperlipidemia, unspecified: Secondary | ICD-10-CM | POA: Diagnosis not present

## 2016-03-06 DIAGNOSIS — Z96611 Presence of right artificial shoulder joint: Secondary | ICD-10-CM | POA: Diagnosis present

## 2016-03-06 DIAGNOSIS — Z96653 Presence of artificial knee joint, bilateral: Secondary | ICD-10-CM | POA: Diagnosis present

## 2016-03-06 DIAGNOSIS — Z9841 Cataract extraction status, right eye: Secondary | ICD-10-CM

## 2016-03-06 DIAGNOSIS — E119 Type 2 diabetes mellitus without complications: Secondary | ICD-10-CM | POA: Diagnosis present

## 2016-03-06 DIAGNOSIS — Z9842 Cataract extraction status, left eye: Secondary | ICD-10-CM

## 2016-03-06 DIAGNOSIS — Z86010 Personal history of colonic polyps: Secondary | ICD-10-CM

## 2016-03-06 DIAGNOSIS — Z853 Personal history of malignant neoplasm of breast: Secondary | ICD-10-CM

## 2016-03-06 DIAGNOSIS — Z419 Encounter for procedure for purposes other than remedying health state, unspecified: Secondary | ICD-10-CM

## 2016-03-06 DIAGNOSIS — D649 Anemia, unspecified: Secondary | ICD-10-CM | POA: Diagnosis not present

## 2016-03-06 DIAGNOSIS — Z79899 Other long term (current) drug therapy: Secondary | ICD-10-CM

## 2016-03-06 DIAGNOSIS — Z7982 Long term (current) use of aspirin: Secondary | ICD-10-CM

## 2016-03-06 DIAGNOSIS — Z7984 Long term (current) use of oral hypoglycemic drugs: Secondary | ICD-10-CM

## 2016-03-06 DIAGNOSIS — Z803 Family history of malignant neoplasm of breast: Secondary | ICD-10-CM | POA: Diagnosis not present

## 2016-03-06 DIAGNOSIS — M4806 Spinal stenosis, lumbar region: Principal | ICD-10-CM | POA: Diagnosis present

## 2016-03-06 DIAGNOSIS — R609 Edema, unspecified: Secondary | ICD-10-CM | POA: Diagnosis not present

## 2016-03-06 DIAGNOSIS — Z981 Arthrodesis status: Secondary | ICD-10-CM | POA: Diagnosis not present

## 2016-03-06 DIAGNOSIS — I959 Hypotension, unspecified: Secondary | ICD-10-CM | POA: Diagnosis not present

## 2016-03-06 DIAGNOSIS — K5790 Diverticulosis of intestine, part unspecified, without perforation or abscess without bleeding: Secondary | ICD-10-CM | POA: Diagnosis present

## 2016-03-06 DIAGNOSIS — M48062 Spinal stenosis, lumbar region with neurogenic claudication: Secondary | ICD-10-CM | POA: Diagnosis present

## 2016-03-06 DIAGNOSIS — E039 Hypothyroidism, unspecified: Secondary | ICD-10-CM | POA: Diagnosis present

## 2016-03-06 DIAGNOSIS — K219 Gastro-esophageal reflux disease without esophagitis: Secondary | ICD-10-CM | POA: Diagnosis present

## 2016-03-06 DIAGNOSIS — Z961 Presence of intraocular lens: Secondary | ICD-10-CM | POA: Diagnosis present

## 2016-03-06 DIAGNOSIS — Z833 Family history of diabetes mellitus: Secondary | ICD-10-CM

## 2016-03-06 DIAGNOSIS — G9741 Accidental puncture or laceration of dura during a procedure: Secondary | ICD-10-CM | POA: Diagnosis not present

## 2016-03-06 HISTORY — PX: LUMBAR LAMINECTOMY/DECOMPRESSION MICRODISCECTOMY: SHX5026

## 2016-03-06 LAB — TYPE AND SCREEN
ABO/RH(D): O POS
Antibody Screen: NEGATIVE

## 2016-03-06 LAB — GLUCOSE, CAPILLARY
Glucose-Capillary: 111 mg/dL — ABNORMAL HIGH (ref 65–99)
Glucose-Capillary: 173 mg/dL — ABNORMAL HIGH (ref 65–99)
Glucose-Capillary: 164 mg/dL — ABNORMAL HIGH (ref 65–99)
Glucose-Capillary: 135 mg/dL — ABNORMAL HIGH (ref 65–99)
Glucose-Capillary: 122 mg/dL — ABNORMAL HIGH (ref 65–99)

## 2016-03-06 SURGERY — LUMBAR LAMINECTOMY/DECOMPRESSION MICRODISCECTOMY
Anesthesia: General

## 2016-03-06 MED ORDER — MIDAZOLAM HCL 2 MG/2ML IJ SOLN
INTRAMUSCULAR | Status: AC
  Filled 2016-03-06: qty 2

## 2016-03-06 MED ORDER — FUROSEMIDE 20 MG PO TABS
20.0000 mg | ORAL_TABLET | Freq: Every day | ORAL | Status: DC
  Administered 2016-03-08: 20 mg via ORAL
  Filled 2016-03-06 (×3): qty 1

## 2016-03-06 MED ORDER — BISACODYL 5 MG PO TBEC: 5.0000 mg | DELAYED_RELEASE_TABLET | Freq: Every day | ORAL | Status: DC | PRN

## 2016-03-06 MED ORDER — PHENOL 1.4 % MT LIQD
1.0000 | OROMUCOSAL | Status: DC | PRN
  Filled 2016-03-06: qty 177

## 2016-03-06 MED ORDER — SUGAMMADEX SODIUM 200 MG/2ML IV SOLN
INTRAVENOUS | Status: DC | PRN
  Administered 2016-03-06: 150 mg via INTRAVENOUS

## 2016-03-06 MED ORDER — BUPIVACAINE-EPINEPHRINE 0.5% -1:200000 IJ SOLN
INTRAMUSCULAR | Status: DC | PRN
  Administered 2016-03-06: 20 mL

## 2016-03-06 MED ORDER — CEFAZOLIN IN D5W 1 GM/50ML IV SOLN
1.0000 g | Freq: Three times a day (TID) | INTRAVENOUS | Status: AC
  Administered 2016-03-06 – 2016-03-07 (×3): 1 g via INTRAVENOUS
  Filled 2016-03-06 (×3): qty 50

## 2016-03-06 MED ORDER — ACETAMINOPHEN 650 MG RE SUPP: 650.0000 mg | RECTAL | Status: DC | PRN

## 2016-03-06 MED ORDER — OXYCODONE-ACETAMINOPHEN 5-325 MG PO TABS
1.0000 | ORAL_TABLET | ORAL | Status: DC | PRN
  Administered 2016-03-06: 2 via ORAL
  Administered 2016-03-06: 1 via ORAL
  Administered 2016-03-07 (×2): 2 via ORAL
  Filled 2016-03-06: qty 2
  Filled 2016-03-06: qty 1
  Filled 2016-03-06 (×2): qty 2

## 2016-03-06 MED ORDER — PHENYLEPHRINE HCL 10 MG/ML IJ SOLN
INTRAMUSCULAR | Status: DC | PRN
  Administered 2016-03-06: 40 ug via INTRAVENOUS
  Administered 2016-03-06 (×2): 80 ug via INTRAVENOUS
  Administered 2016-03-06: 120 ug via INTRAVENOUS

## 2016-03-06 MED ORDER — LIDOCAINE HCL (CARDIAC) 20 MG/ML IV SOLN
INTRAVENOUS | Status: DC | PRN
  Administered 2016-03-06: 40 mg via INTRAVENOUS
  Administered 2016-03-06: 100 mg via INTRAVENOUS

## 2016-03-06 MED ORDER — PROPOFOL 10 MG/ML IV BOLUS
INTRAVENOUS | Status: AC
  Filled 2016-03-06: qty 20

## 2016-03-06 MED ORDER — ACETAMINOPHEN 325 MG PO TABS: 650.0000 mg | ORAL_TABLET | ORAL | Status: DC | PRN

## 2016-03-06 MED ORDER — SODIUM CHLORIDE 0.9 % IV SOLN
INTRAVENOUS | Status: DC | PRN
  Administered 2016-03-06 (×2): 25 ug/min via INTRAVENOUS

## 2016-03-06 MED ORDER — FENTANYL CITRATE (PF) 250 MCG/5ML IJ SOLN
INTRAMUSCULAR | Status: AC
  Filled 2016-03-06: qty 5

## 2016-03-06 MED ORDER — ONDANSETRON HCL 4 MG/2ML IJ SOLN: 4.0000 mg | INTRAMUSCULAR | Status: DC | PRN

## 2016-03-06 MED ORDER — LACTATED RINGERS IV SOLN
INTRAVENOUS | Status: DC
  Administered 2016-03-06 – 2016-03-09 (×6): via INTRAVENOUS

## 2016-03-06 MED ORDER — BUPIVACAINE LIPOSOME 1.3 % IJ SUSP
20.0000 mL | Freq: Once | INTRAMUSCULAR | Status: DC
  Filled 2016-03-06 (×2): qty 20

## 2016-03-06 MED ORDER — SODIUM CHLORIDE 0.9 % IR SOLN
Status: AC
  Filled 2016-03-06: qty 1

## 2016-03-06 MED ORDER — HYDROMORPHONE HCL 1 MG/ML IJ SOLN
0.5000 mg | INTRAMUSCULAR | Status: DC | PRN
  Administered 2016-03-06: 0.5 mg via INTRAVENOUS
  Administered 2016-03-07: 1 mg via INTRAVENOUS
  Filled 2016-03-06 (×2): qty 1

## 2016-03-06 MED ORDER — PANTOPRAZOLE SODIUM 40 MG PO TBEC
40.0000 mg | DELAYED_RELEASE_TABLET | Freq: Every day | ORAL | Status: DC
  Administered 2016-03-07 – 2016-03-09 (×3): 40 mg via ORAL
  Filled 2016-03-06 (×3): qty 1

## 2016-03-06 MED ORDER — POLYETHYLENE GLYCOL 3350 17 G PO PACK: 17.0000 g | PACK | Freq: Every day | ORAL | Status: DC | PRN

## 2016-03-06 MED ORDER — CEFAZOLIN SODIUM-DEXTROSE 2-4 GM/100ML-% IV SOLN
2.0000 g | INTRAVENOUS | Status: AC
  Administered 2016-03-06: 2 g via INTRAVENOUS

## 2016-03-06 MED ORDER — ATORVASTATIN CALCIUM 10 MG PO TABS
10.0000 mg | ORAL_TABLET | ORAL | Status: DC
  Administered 2016-03-07 – 2016-03-09 (×2): 10 mg via ORAL
  Filled 2016-03-06 (×2): qty 1

## 2016-03-06 MED ORDER — HYDROCODONE-ACETAMINOPHEN 5-325 MG PO TABS
1.0000 | ORAL_TABLET | ORAL | Status: DC | PRN
  Administered 2016-03-06: 1 via ORAL
  Administered 2016-03-07 – 2016-03-09 (×9): 2 via ORAL
  Filled 2016-03-06: qty 2
  Filled 2016-03-06: qty 1
  Filled 2016-03-06 (×9): qty 2

## 2016-03-06 MED ORDER — BACITRACIN-NEOMYCIN-POLYMYXIN 400-5-5000 EX OINT
TOPICAL_OINTMENT | CUTANEOUS | Status: AC
  Filled 2016-03-06: qty 1

## 2016-03-06 MED ORDER — LACTATED RINGERS IV SOLN
INTRAVENOUS | Status: DC | PRN
  Administered 2016-03-06: 07:00:00 via INTRAVENOUS

## 2016-03-06 MED ORDER — BUPIVACAINE LIPOSOME 1.3 % IJ SUSP
INTRAMUSCULAR | Status: DC | PRN
  Administered 2016-03-06: 8 mL

## 2016-03-06 MED ORDER — ROCURONIUM BROMIDE 100 MG/10ML IV SOLN
INTRAVENOUS | Status: DC | PRN
  Administered 2016-03-06: 30 mg via INTRAVENOUS

## 2016-03-06 MED ORDER — BUPIVACAINE-EPINEPHRINE (PF) 0.5% -1:200000 IJ SOLN
INTRAMUSCULAR | Status: AC
  Filled 2016-03-06: qty 30

## 2016-03-06 MED ORDER — INSULIN ASPART 100 UNIT/ML ~~LOC~~ SOLN
0.0000 [IU] | Freq: Three times a day (TID) | SUBCUTANEOUS | Status: DC
  Administered 2016-03-06 – 2016-03-07 (×3): 3 [IU] via SUBCUTANEOUS
  Administered 2016-03-07 – 2016-03-08 (×2): 2 [IU] via SUBCUTANEOUS
  Administered 2016-03-08: 5 [IU] via SUBCUTANEOUS
  Administered 2016-03-08 – 2016-03-09 (×2): 2 [IU] via SUBCUTANEOUS
  Administered 2016-03-09: 3 [IU] via SUBCUTANEOUS
  Filled 2016-03-06: qty 1

## 2016-03-06 MED ORDER — ONDANSETRON HCL 4 MG/2ML IJ SOLN
INTRAMUSCULAR | Status: DC | PRN
  Administered 2016-03-06: 4 mg via INTRAVENOUS

## 2016-03-06 MED ORDER — SODIUM CHLORIDE 0.9 % IR SOLN
Status: DC | PRN
  Administered 2016-03-06: 500 mL

## 2016-03-06 MED ORDER — MENTHOL 3 MG MT LOZG: 1.0000 | LOZENGE | OROMUCOSAL | Status: DC | PRN

## 2016-03-06 MED ORDER — FENTANYL CITRATE (PF) 100 MCG/2ML IJ SOLN
25.0000 ug | INTRAMUSCULAR | Status: DC | PRN
  Administered 2016-03-06 (×2): 50 ug via INTRAVENOUS

## 2016-03-06 MED ORDER — FENTANYL CITRATE (PF) 100 MCG/2ML IJ SOLN
INTRAMUSCULAR | Status: AC
  Filled 2016-03-06: qty 2

## 2016-03-06 MED ORDER — FLEET ENEMA 7-19 GM/118ML RE ENEM: 1.0000 | ENEMA | Freq: Once | RECTAL | Status: DC | PRN

## 2016-03-06 MED ORDER — FENTANYL CITRATE (PF) 250 MCG/5ML IJ SOLN
INTRAMUSCULAR | Status: DC | PRN
  Administered 2016-03-06: 50 ug via INTRAVENOUS
  Administered 2016-03-06: 75 ug via INTRAVENOUS
  Administered 2016-03-06 (×3): 25 ug via INTRAVENOUS

## 2016-03-06 MED ORDER — PROPOFOL 10 MG/ML IV BOLUS
INTRAVENOUS | Status: DC | PRN
  Administered 2016-03-06: 40 mg via INTRAVENOUS
  Administered 2016-03-06: 100 mg via INTRAVENOUS

## 2016-03-06 MED ORDER — ONDANSETRON HCL 4 MG/2ML IJ SOLN: 4.0000 mg | Freq: Once | INTRAMUSCULAR | Status: DC | PRN

## 2016-03-06 MED ORDER — CEFAZOLIN SODIUM-DEXTROSE 2-4 GM/100ML-% IV SOLN
INTRAVENOUS | Status: AC
Start: 2016-03-06 — End: 2016-03-06
  Filled 2016-03-06: qty 100

## 2016-03-06 SURGICAL SUPPLY — 48 items
AGENT HMST SPONGE THK3/8 (HEMOSTASIS) ×1
APL SKNCLS STERI-STRIP NONHPOA (GAUZE/BANDAGES/DRESSINGS) ×1
BAG SPEC THK2 15X12 ZIP CLS (MISCELLANEOUS)
BAG ZIPLOCK 12X15 (MISCELLANEOUS) IMPLANT
BENZOIN TINCTURE PRP APPL 2/3 (GAUZE/BANDAGES/DRESSINGS) ×3 IMPLANT
CLEANER TIP ELECTROSURG 2X2 (MISCELLANEOUS) ×3 IMPLANT
DRAIN PENROSE 18X1/4 LTX STRL (WOUND CARE) IMPLANT
DRAPE MICROSCOPE LEICA (MISCELLANEOUS) ×3 IMPLANT
DRAPE POUCH INSTRU U-SHP 10X18 (DRAPES) ×3 IMPLANT
DRAPE SHEET LG 3/4 BI-LAMINATE (DRAPES) ×3 IMPLANT
DRAPE SURG 17X11 SM STRL (DRAPES) ×3 IMPLANT
DRSG ADAPTIC 3X8 NADH LF (GAUZE/BANDAGES/DRESSINGS) ×3 IMPLANT
DRSG PAD ABDOMINAL 8X10 ST (GAUZE/BANDAGES/DRESSINGS) ×12 IMPLANT
DURAFORM SPONGE 2X2 SINGLE (Neuro Prosthesis/Implant) ×3 IMPLANT
DURAPREP 26ML APPLICATOR (WOUND CARE) ×3 IMPLANT
ELECT BLADE TIP CTD 4 INCH (ELECTRODE) ×3 IMPLANT
ELECT REM PT RETURN 9FT ADLT (ELECTROSURGICAL) ×3
ELECTRODE REM PT RTRN 9FT ADLT (ELECTROSURGICAL) ×1 IMPLANT
GAUZE SPONGE 4X4 12PLY STRL (GAUZE/BANDAGES/DRESSINGS) ×3 IMPLANT
GLOVE BIOGEL PI IND STRL 6.5 (GLOVE) ×6 IMPLANT
GLOVE BIOGEL PI IND STRL 8 (GLOVE) ×1 IMPLANT
GLOVE BIOGEL PI INDICATOR 6.5 (GLOVE) ×12
GLOVE BIOGEL PI INDICATOR 8 (GLOVE) ×2
GLOVE ECLIPSE 8.0 STRL XLNG CF (GLOVE) ×6 IMPLANT
GOWN STRL REUS W/TWL XL LVL3 (GOWN DISPOSABLE) ×6 IMPLANT
HEMOSTAT SPONGE AVITENE ULTRA (HEMOSTASIS) ×3 IMPLANT
KIT BASIN OR (CUSTOM PROCEDURE TRAY) ×3 IMPLANT
KIT POSITIONING SURG ANDREWS (MISCELLANEOUS) ×3 IMPLANT
MANIFOLD NEPTUNE II (INSTRUMENTS) ×3 IMPLANT
MARKER SKIN DUAL TIP RULER LAB (MISCELLANEOUS) ×3 IMPLANT
NEEDLE HYPO 22GX1.5 SAFETY (NEEDLE) ×6 IMPLANT
NEEDLE SPNL 18GX3.5 QUINCKE PK (NEEDLE) ×6 IMPLANT
PACK LAMINECTOMY ORTHO (CUSTOM PROCEDURE TRAY) ×3 IMPLANT
PAD ABD 8X10 STRL (GAUZE/BANDAGES/DRESSINGS) ×3 IMPLANT
PATTIES SURGICAL .5 X.5 (GAUZE/BANDAGES/DRESSINGS) ×3 IMPLANT
PATTIES SURGICAL .75X.75 (GAUZE/BANDAGES/DRESSINGS) ×3 IMPLANT
PATTIES SURGICAL 1X1 (DISPOSABLE) ×3 IMPLANT
PIN SAFETY NICK PLATE  2 MED (MISCELLANEOUS)
PIN SAFETY NICK PLATE 2 MED (MISCELLANEOUS) IMPLANT
SPONGE LAP 4X18 X RAY DECT (DISPOSABLE) ×12 IMPLANT
STAPLER VISISTAT 35W (STAPLE) ×3 IMPLANT
SUT VIC AB 0 CT1 27 (SUTURE) ×3
SUT VIC AB 0 CT1 27XBRD ANTBC (SUTURE) ×1 IMPLANT
SUT VIC AB 1 CT1 27 (SUTURE) ×9
SUT VIC AB 1 CT1 27XBRD ANTBC (SUTURE) ×3 IMPLANT
SYR 20CC LL (SYRINGE) ×6 IMPLANT
TOWEL OR 17X26 10 PK STRL BLUE (TOWEL DISPOSABLE) ×3 IMPLANT
TRAY FOLEY CATH 14FR (SET/KITS/TRAYS/PACK) ×3 IMPLANT

## 2016-03-06 NOTE — Transfer of Care (Signed)
Immediate Anesthesia Transfer of Care Note  Patient: CASSANDREA BAGNASCO  Procedure(s) Performed: Procedure(s): CENTRAL DECOMPRESSION L3 - L4 ,L4 - L5 (N/A)  Patient Location: PACU  Anesthesia Type:General  Level of Consciousness: sedated  Airway & Oxygen Therapy: Patient Spontanous Breathing and Patient connected to face mask oxygen  Post-op Assessment: Report given to RN and Post -op Vital signs reviewed and stable  Post vital signs: Reviewed and stable  Last Vitals:  Vitals:   03/06/16 0517  BP: (P) 137/61  Pulse: (P) 72  Resp: (P) 16  Temp: (P) 36.6 C    Last Pain:  Vitals:   03/06/16 0529  TempSrc:   PainSc: 2       Patients Stated Pain Goal: 4 ([id number])  Complications: No apparent anesthesia complications

## 2016-03-06 NOTE — Anesthesia Procedure Notes (Signed)
Procedure Name: Intubation Date/Time: 03/06/2016 7:26 AM Performed by: Cynda Familia Pre-anesthesia Checklist: Patient identified, Emergency Drugs available, Suction available and Patient being monitored Patient Re-evaluated:Patient Re-evaluated prior to inductionOxygen Delivery Method: Circle System Utilized Preoxygenation: Pre-oxygenation with 100% oxygen Intubation Type: IV induction Ventilation: Mask ventilation without difficulty Laryngoscope Size: Miller and 2 Grade View: Grade I Tube type: Oral Tube size: 7.0 mm Number of attempts: 1 Airway Equipment and Method: Stylet Placement Confirmation: ETT inserted through vocal cords under direct vision,  positive ETCO2 and breath sounds checked- equal and bilateral Secured at: 22 cm Tube secured with: Tape Dental Injury: Teeth and Oropharynx as per pre-operative assessment  Comments: Smooth IV induction by Cheral Bay MD--- intubation AM CRNA atraumatic--- teeth and mouth as preop--- bilat BS Cheral Bay

## 2016-03-06 NOTE — Anesthesia Postprocedure Evaluation (Signed)
Anesthesia Post Note  Patient: [name]  Procedure(s) Performed: Procedure(s) (LRB): CENTRAL DECOMPRESSION L3 - L4 ,L4 - L5 (N/A)  Patient location during evaluation: PACU Anesthesia Type: General Level of consciousness: awake and alert Pain management: pain level controlled Vital Signs Assessment: post-procedure vital signs reviewed and stable Respiratory status: spontaneous breathing, nonlabored ventilation and respiratory function stable Cardiovascular status: blood pressure returned to baseline and stable Postop Assessment: no signs of nausea or vomiting Anesthetic complications: no    Last Vitals:  Vitals:   03/06/16 1015 03/06/16 1033  BP: 125/67 120/65  Pulse: 87 90  Resp: 10 10  Temp:  36.4 C    Last Pain:  Vitals:   03/06/16 1000  TempSrc:   PainSc: 6                  Nilda Simmer

## 2016-03-06 NOTE — H&P (Signed)
[name] is an 82 y.o. female.   Chief Complaint: Pain and weakness of her right lower extremity. HPI: She complained of progressive weakness and pain in her lower extremities.   Past Medical History:  Diagnosis Date  . Arthritis   . Breast cancer ([place])   . Breast cancer of upper-outer quadrant of right female breast ([place]) 07/21/2015  . Colon polyps    adenomatous  . DDD (degenerative disc disease)   . Diabetes mellitus   . Diverticulosis   . Dyslipidemia   . Family history of breast cancer   . Family history of pancreatic cancer   . Family history of prostate cancer   . GERD (gastroesophageal reflux disease)   . Hemorrhoids   . History of blood transfusion   . Peripheral edema    ankles, takes lasix  . PONV (postoperative nausea and vomiting)   . Radiation 09/13/15-10/12/15   42.72 gray to right breast with lumpectomy cavity boost of 12 gray  . Renal insufficiency   . Shingles     Past Surgical History:  Procedure Laterality Date  . BREAST LUMPECTOMY WITH RADIOACTIVE SEED LOCALIZATION Right 08/03/2015   Procedure: BREAST LUMPECTOMY WITH RADIOACTIVE SEED LOCALIZATION;  Surgeon: [name], MD;  Location: [facility];  Service: General;  Laterality: Right;  . CARPAL TUNNEL RELEASE Right 1977  . CATARACT EXTRACTION W/ INTRAOCULAR LENS  IMPLANT, BILATERAL Bilateral    left 1996; right 1997  . GANGLION CYST EXCISION Right 2003   hand  . JOINT REPLACEMENT    . TOTAL KNEE ARTHROPLASTY Right 2003  . TOTAL KNEE ARTHROPLASTY Left 2010  . TOTAL SHOULDER ARTHROPLASTY Right 2010  . [place]  . VAGINAL HYSTERECTOMY  1975    Family History  Problem Relation Age of Onset  . Pancreatic cancer Mother 36  . Diabetes Mother   . Prostate cancer Brother   . Diabetes Father   . Heart disease Father   . Diabetes Brother   . Heart attack Brother   . Diabetes Sister   . Diabetes Daughter   . Diabetes Son   . Coronary artery disease Brother      MI in his 22s  . Colon cancer Neg Hx   . Stomach cancer Neg Hx    Social History:  reports that she has never smoked. She has never used smokeless tobacco. She reports that she does not drink alcohol or use drugs.  Allergies: No Known Allergies  Medications Prior to Admission  Medication Sig Dispense Refill  . aspirin 81 MG chewable tablet Chew 81 mg by mouth daily.      Marland Kitchen atorvastatin (LIPITOR) 10 MG tablet Take 1 tablet (10 mg total) by mouth daily. (Patient taking differently: Take 10 mg by mouth every other day. ) 90 tablet 3  . Blood Glucose Calibration (OT ULTRA/FASTTK CNTRL SOLN) SOLN Use as instructed 1 each 11  . Cyanocobalamin (B-12 PO) Take 1 tablet by mouth daily.    Marland Kitchen esomeprazole (NEXIUM) 40 MG capsule TAKE 1 CAPSULE DAILY BEFOREBREAKFAST (Patient taking differently: Take 40 mg by mouth every other day. ) 90 capsule 3  . furosemide (LASIX) 40 MG tablet TAKE 1 TABLET DAILY FOR    EDEMA (Patient taking differently: Take 20 mg by mouth daily. ) 90 tablet 3  . gabapentin (NEURONTIN) 300 MG capsule TAKE 1 CAPSULE(300 MG) BY MOUTH AT BEDTIME 30 capsule 3  . glipiZIDE (GLUCOTROL XL) 5 MG 24 hr tablet Take 1 tablet (  5 mg total) by mouth daily. 90 tablet 3  . glucose blood (ONETOUCH VERIO) test strip Use to test blood sugar twice daily 300 each 3  . ONE TOUCH LANCETS MISC Use to test blood sugar twice daily 600 each 3  . oxyCODONE-acetaminophen (PERCOCET/ROXICET) 5-325 MG tablet Take 1 tablet by mouth every 4 (four) hours as needed for severe pain. (Patient taking differently: Take 1 tablet by mouth daily. ) 20 tablet 0  . orphenadrine (NORFLEX) 100 MG tablet Take 1 tablet (100 mg total) by mouth 2 (two) times daily. (Patient not taking: Reported on 02/23/2016) 30 tablet 0    Results for orders placed or performed during the hospital encounter of 03/06/16 (from the past 48 hour(s))  Glucose, capillary     Status: Abnormal   Collection Time: 03/06/16  5:10 AM  Result Value Ref Range    Glucose-Capillary 111 (H) 65 - 99 mg/dL   Comment 1 Notify RN    No results found.  Review of Systems  Constitutional: Negative.   HENT: Negative.   Eyes: Negative.   Respiratory: Negative.   Cardiovascular: Negative.   Skin: Negative.     Blood pressure (P) 137/61, pulse (P) 72, temperature (P) 97.9 F (36.6 C), temperature source (P) Oral, resp. rate (P) 16, height 5\' 1"  (1.549 m), weight 69.9 kg (154 lb), SpO2 (P) 96 %. Physical Exam  Constitutional: She appears well-developed.  HENT:  Head: Normocephalic.  Eyes: Pupils are equal, round, and reactive to light.  Neck: Normal range of motion.  Cardiovascular: Normal rate.   Respiratory: Effort normal.  Musculoskeletal:  Weakness of her Right lower extremity.  Neurological:  Weakness of her right lower extremity.  Skin: Skin is warm.  Psychiatric: She has a normal mood and affect.     Assessment/Plan Central decompressive lumbar laminectomy and microdiscectomy at L-3-L-4.for spinal stenosis and HNP.  Nedda Gains A, MD 03/06/2016, 7:00 AM

## 2016-03-06 NOTE — Brief Op Note (Signed)
03/06/2016  9:18 AM  PATIENT:  [name]  82 y.o. female  PRE-OPERATIVE DIAGNOSIS:  spinal stenosis L3 - L4 and L4 - L5  POST-OPERATIVE DIAGNOSIS:  spinal stenosis L3 - L4 and L4 - L5  PROCEDURE:  Procedure(s): CENTRAL DECOMPRESSION L3 - L4 and Possible L4 - L5 LUMBAR LAMINECTOMY (N/A)  SURGEON:  Surgeon(s) and Role:    * [name], MD - Primary  PHYSICIAN ASSISTANT: Ardeen Jourdain  ASSISTANTS: Ardeen Jourdain PA  ANESTHESIA:   general  EBL:  Total I/O In: -  Out: 300 [Urine:200; Blood:100]  BLOOD ADMINISTERED:none  DRAINS: none   LOCAL MEDICATIONS USED:  MARCAINE 20cc of 0.50% with Epinephrine at start of case and 5cc of Exparel at the end of the case.    SPECIMEN:  No Specimen  DISPOSITION OF SPECIMEN:  N/A  COUNTS:  YES  TOURNIQUET:  * No tourniquets in log *  DICTATION: .Other Dictation: Dictation Number [id number]  PLAN OF CARE: Admit to inpatient   PATIENT DISPOSITION:  Stable in OR   Delay start of Pharmacological VTE agent (>24hrs) due to surgical blood loss or risk of bleeding: yes

## 2016-03-06 NOTE — Interval H&P Note (Signed)
History and Physical Interval Note:  03/06/2016 7:04 AM  [name]  has presented today for surgery, with the diagnosis of spinal stenosis L3 - L4 and L4 - L5  The various methods of treatment have been discussed with the patient and family. After consideration of risks, benefits and other options for treatment, the patient has consented to  Procedure(s): CENTRAL DECOMPRESSION L3 - L4 and Possible L4 - L5 LUMBAR LAMINECTOMY (N/A) as a surgical intervention .  The patient's history has been reviewed, patient examined, no change in status, stable for surgery.  I have reviewed the patient's chart and labs.  Questions were answered to the patient's satisfaction.     [name] A

## 2016-03-07 DIAGNOSIS — Z96611 Presence of right artificial shoulder joint: Secondary | ICD-10-CM | POA: Diagnosis present

## 2016-03-07 DIAGNOSIS — E039 Hypothyroidism, unspecified: Secondary | ICD-10-CM | POA: Diagnosis present

## 2016-03-07 DIAGNOSIS — Z96653 Presence of artificial knee joint, bilateral: Secondary | ICD-10-CM | POA: Diagnosis present

## 2016-03-07 DIAGNOSIS — E119 Type 2 diabetes mellitus without complications: Secondary | ICD-10-CM | POA: Diagnosis present

## 2016-03-07 DIAGNOSIS — Z7982 Long term (current) use of aspirin: Secondary | ICD-10-CM | POA: Diagnosis not present

## 2016-03-07 DIAGNOSIS — Z803 Family history of malignant neoplasm of breast: Secondary | ICD-10-CM | POA: Diagnosis not present

## 2016-03-07 DIAGNOSIS — M4806 Spinal stenosis, lumbar region: Secondary | ICD-10-CM | POA: Diagnosis present

## 2016-03-07 DIAGNOSIS — Z833 Family history of diabetes mellitus: Secondary | ICD-10-CM | POA: Diagnosis not present

## 2016-03-07 DIAGNOSIS — D649 Anemia, unspecified: Secondary | ICD-10-CM | POA: Diagnosis present

## 2016-03-07 DIAGNOSIS — I959 Hypotension, unspecified: Secondary | ICD-10-CM | POA: Diagnosis not present

## 2016-03-07 DIAGNOSIS — Z9842 Cataract extraction status, left eye: Secondary | ICD-10-CM | POA: Diagnosis not present

## 2016-03-07 DIAGNOSIS — Z9841 Cataract extraction status, right eye: Secondary | ICD-10-CM | POA: Diagnosis not present

## 2016-03-07 DIAGNOSIS — Z7984 Long term (current) use of oral hypoglycemic drugs: Secondary | ICD-10-CM | POA: Diagnosis not present

## 2016-03-07 DIAGNOSIS — K5790 Diverticulosis of intestine, part unspecified, without perforation or abscess without bleeding: Secondary | ICD-10-CM | POA: Diagnosis present

## 2016-03-07 DIAGNOSIS — E785 Hyperlipidemia, unspecified: Secondary | ICD-10-CM | POA: Diagnosis present

## 2016-03-07 DIAGNOSIS — Z961 Presence of intraocular lens: Secondary | ICD-10-CM | POA: Diagnosis present

## 2016-03-07 DIAGNOSIS — Z86010 Personal history of colonic polyps: Secondary | ICD-10-CM | POA: Diagnosis not present

## 2016-03-07 DIAGNOSIS — R531 Weakness: Secondary | ICD-10-CM | POA: Diagnosis not present

## 2016-03-07 DIAGNOSIS — Z79899 Other long term (current) drug therapy: Secondary | ICD-10-CM | POA: Diagnosis not present

## 2016-03-07 DIAGNOSIS — Z853 Personal history of malignant neoplasm of breast: Secondary | ICD-10-CM | POA: Diagnosis not present

## 2016-03-07 DIAGNOSIS — K219 Gastro-esophageal reflux disease without esophagitis: Secondary | ICD-10-CM | POA: Diagnosis present

## 2016-03-07 DIAGNOSIS — R609 Edema, unspecified: Secondary | ICD-10-CM | POA: Diagnosis present

## 2016-03-07 LAB — GLUCOSE, CAPILLARY
Glucose-Capillary: 158 mg/dL — ABNORMAL HIGH (ref 65–99)
Glucose-Capillary: 200 mg/dL — ABNORMAL HIGH (ref 65–99)
Glucose-Capillary: 140 mg/dL — ABNORMAL HIGH (ref 65–99)
Glucose-Capillary: 182 mg/dL — ABNORMAL HIGH (ref 65–99)

## 2016-03-07 LAB — HEMOGLOBIN AND HEMATOCRIT, BLOOD
HCT: 29.6 % — ABNORMAL LOW (ref 36.0–46.0)
Hemoglobin: 9.8 g/dL — ABNORMAL LOW (ref 12.0–15.0)

## 2016-03-07 MED ORDER — ASPIRIN 325 MG PO TABS: 325.0000 mg | ORAL_TABLET | Freq: Two times a day (BID) | ORAL | 0 refills | Status: DC

## 2016-03-07 MED ORDER — ASPIRIN 325 MG PO TABS
325.0000 mg | ORAL_TABLET | Freq: Two times a day (BID) | ORAL | Status: DC
  Administered 2016-03-07 – 2016-03-09 (×5): 325 mg via ORAL
  Filled 2016-03-07 (×5): qty 1

## 2016-03-07 MED ORDER — FERROUS SULFATE 325 (65 FE) MG PO TABS
325.0000 mg | ORAL_TABLET | Freq: Two times a day (BID) | ORAL | Status: DC
  Administered 2016-03-07 – 2016-03-09 (×4): 325 mg via ORAL
  Filled 2016-03-07 (×4): qty 1

## 2016-03-07 MED ORDER — LACTATED RINGERS IV BOLUS (SEPSIS): 500.0000 mL | Freq: Once | INTRAVENOUS | Status: DC

## 2016-03-07 MED ORDER — DOCUSATE SODIUM 100 MG PO CAPS
100.0000 mg | ORAL_CAPSULE | Freq: Two times a day (BID) | ORAL | Status: DC
  Administered 2016-03-07 – 2016-03-09 (×4): 100 mg via ORAL
  Filled 2016-03-07 (×4): qty 1

## 2016-03-07 NOTE — Progress Notes (Signed)
PT Cancellation Note  Patient Details Name: [name] MRN: [id number] DOB: 07-14-1929   Cancelled Treatment:    Reason Eval/Treat Not Completed: Medical issues which prohibited therapy (bedrest, start 9/1)     Tresa Endo PT [id number]  [name] 03/07/2016, 7:32 AM

## 2016-03-07 NOTE — Progress Notes (Signed)
OT Cancellation Note  Patient Details Name: [name] MRN: [id number] DOB: 1929-10-20   Cancelled Treatment:    Reason Eval/Treat Not Completed: Medical issues which prohibited therapy.  Per orders, PT/OT to start tomorrow.  Atharv Barriere 03/07/2016, 7:30 AM  Lesle Chris, OTR/L [phone] 03/07/2016

## 2016-03-07 NOTE — Progress Notes (Signed)
Subjective: 1 Day Post-Op Procedure(s) (LRB): CENTRAL DECOMPRESSION L3 - L4 ,L4 - L5 (N/A) Patient reports pain as 2 on 0-10 scale.Dressing changed and wound is dry.No wound complications. Some pain in anterior right tibia. No neurological deficit in her lowers.    Objective: Vital signs in last 24 hours: Temp:  [97.5 F (36.4 C)-99.8 F (37.7 C)] 99.8 F (37.7 C) (08/31 0608) Pulse Rate:  [87-98] 96 (08/31 0608) Resp:  [9-16] 12 (08/31 0608) BP: (94-131)/(40-67) 119/51 (08/31 0608) SpO2:  [95 %-100 %] 96 % (08/31 0608) Weight:  [69.9 kg (154 lb)] 69.9 kg (154 lb) (08/30 1030)  Intake/Output from previous day: 08/30 0701 - 08/31 0700 In: 3456.3 [P.O.:1440; I.V.:1866.3; IV Piggyback:150] Out: 2095 [Urine:1995; Blood:100] Intake/Output this shift: No intake/output data recorded.  No results for input(s): HGB in the last 72 hours. No results for input(s): WBC, RBC, HCT, PLT in the last 72 hours. No results for input(s): NA, K, CL, CO2, BUN, CREATININE, GLUCOSE, CALCIUM in the last 72 hours. No results for input(s): LABPT, INR in the last 72 hours.  Neurologically intact  Assessment/Plan: 1 Day Post-Op Procedure(s) (LRB): CENTRAL DECOMPRESSION L3 - L4 ,L4 - L5 (N/A)  Bed Rest today and up with PT tomorrow.  Kandance Yano A 03/07/2016, 7:17 AM

## 2016-03-07 NOTE — Progress Notes (Signed)
Call to [employer] PA about pt's low BP. New orders received.

## 2016-03-07 NOTE — Progress Notes (Signed)
CSW consulted for SNF placement. PT eval is pending. Pt has been admitted under OBSERVATION status. Medicare will not cover costs of SNF placement due to observation status. CSW is available to assist with pvt pay SNF placement if placement is needed. CSW will meet with pt following PT recommendations.  Werner Lean LCSW [phone]

## 2016-03-07 NOTE — Discharge Instructions (Addendum)
For the first three days, remove your dressing, tape a piece of saran wrap over your incision.  Take your shower, then remove the saran wrap and put a clean dressing on. After three days you can shower without the saran wrap.  Call Dr. Gladstone Lighter if any wound complications or temperature of 101 degrees F or over.  Call the office for an appointment to see Dr. Gladstone Lighter in two weeks: [phone] and ask for Dr. Charlestine Night nurse, Brunilda Payor.  Walk with your walker. Change your dressing daily.  Shower only, no tub bath. Call if any temperatures greater than 101 or any wound complications: [phone] during the day and ask for Dr. Charlestine Night nurse, Brunilda Payor.

## 2016-03-07 NOTE — Care Management Obs Status (Signed)
[facility] NOTIFICATION   Patient Details  Name: [name] MRN: [id number] Date of Birth: December 30, 1929   Medicare Observation Status Notification Given:  Yes    Lynnell Catalan, RN 03/07/2016, 2:32 PM

## 2016-03-07 NOTE — Op Note (Signed)
NAMECORREEN, REBECK               ACCOUNT NO.:  [id number]  MEDICAL RECORD NO.:  [id number]  LOCATION:  [id number]                         FACILITY:  [facility]  PHYSICIAN:  Kipp Brood. Ayra Hodgdon, M.D.DATE OF BIRTH:  26-Dec-1929  DATE OF PROCEDURE:  03/06/2016 DATE OF DISCHARGE:                              OPERATIVE REPORT   SURGEON:  Kipp Brood. Gladstone Lighter, M.D.  OPERATIVE ASSISTANT:  Ardeen Jourdain, PA  PREOPERATIVE DIAGNOSES: 1. Spinal stenosis with complete block at L3-4. 2. Partial block at L4-5.  POSTOPERATIVE DIAGNOSES: 1. Spinal stenosis with complete block at L3-4. 2. Partial block at L4-5.  OPERATION:  Central decompressive lumbar laminectomy at L3-4 and L4-5 for spinal stenosis.  DESCRIPTION OF PROCEDURE:  Under general anesthesia and routine orthopedic prep and draping, the lower back was carried out.  Prior to surgery in the office, I explained the patient all possible complications that could occur such as infection, blood clots in her legs, dural leak, etc.  At this time, appropriate time-out was carried out.  She was on the spinal frame.  I marked the appropriate right side on the back in the holding area.  Although, we needed to go central her symptoms and weaknesses all on the right side.  At this time, the sterile prep and draping was carried out.  Two needles were placed in the back for localization purposes and x-ray was taken.  Following that, we then made an incision over the L3-4 and L4-5 space.  Bleeders were identified.  The muscle was stripped from the lamina and spinous process bilaterally.  At this time, two instruments were placed on the L3 and the L4 spinous processes for localization purposes.  At this time, we then inserted our [place] retractors and went down and removed the spinous process of L3 and L4.  We went up proximal and distally after we did a central decompression, the dura was extremely thin.  We had absolutely complete decompression done,  microscope was brought in.  We utilized that to further help and remove the ligamentum flavum.  Two instruments were placed in the back again for localization purposes. The dura was extremely thin and adhered to the upper lamina and we had a small puncture hole, there was small leak.  So, I thoroughly irrigated out the area and applied some DuraForm patch and then some Ultrafoam on top of that with great care was taken not to put too much pressure on the sac.  We waited several minutes and reinspected it and we had good control of the small leak.  Note, there were no nerve roots exposed.  At the beginning of the case, I did inject 20 mL of 0.5% Marcaine and epinephrine for bleeding purposes.  Then, I injected about 5 mL of Exparel.  The wound was closed in layers in usual fashion.  I left a small distal deep part of the wound open for drainage purposes.  Subcu was closed in the usual fashion.  Staples then reduced and sterile dressings were applied.  She had 2 g of IV Ancef.  She will be kept down at bedrest because of the small leak and hopefully the next day we will discharge her.  ______________________________ Kipp Brood Gladstone Lighter, M.D.     RAG/MEDQ  D:  03/06/2016  T:  03/07/2016  Job:  [id number]

## 2016-03-08 LAB — CBC WITH DIFFERENTIAL/PLATELET
Basophils Absolute: 0 10*3/uL (ref 0.0–0.1)
Basophils Relative: 0 %
Eosinophils Absolute: 0.2 10*3/uL (ref 0.0–0.7)
Eosinophils Relative: 5 %
HCT: 29 % — ABNORMAL LOW (ref 36.0–46.0)
Hemoglobin: 9.4 g/dL — ABNORMAL LOW (ref 12.0–15.0)
Lymphocytes Relative: 24 %
Lymphs Abs: 1.2 10*3/uL (ref 0.7–4.0)
MCH: 29.3 pg (ref 26.0–34.0)
MCHC: 32.4 g/dL (ref 30.0–36.0)
MCV: 90.3 fL (ref 78.0–100.0)
Monocytes Absolute: 0.6 10*3/uL (ref 0.1–1.0)
Monocytes Relative: 12 %
Neutro Abs: 2.9 10*3/uL (ref 1.7–7.7)
Neutrophils Relative %: 59 %
Platelets: 124 10*3/uL — ABNORMAL LOW (ref 150–400)
RBC: 3.21 MIL/uL — ABNORMAL LOW (ref 3.87–5.11)
RDW: 14 % (ref 11.5–15.5)
WBC: 4.8 10*3/uL (ref 4.0–10.5)

## 2016-03-08 LAB — GLUCOSE, CAPILLARY
Glucose-Capillary: 138 mg/dL — ABNORMAL HIGH (ref 65–99)
Glucose-Capillary: 213 mg/dL — ABNORMAL HIGH (ref 65–99)
Glucose-Capillary: 146 mg/dL — ABNORMAL HIGH (ref 65–99)
Glucose-Capillary: 157 mg/dL — ABNORMAL HIGH (ref 65–99)

## 2016-03-08 MED ORDER — FERROUS SULFATE 325 (65 FE) MG PO TABS: 325.0000 mg | ORAL_TABLET | Freq: Two times a day (BID) | ORAL | 0 refills | Status: DC

## 2016-03-08 MED ORDER — HYDROCODONE-ACETAMINOPHEN 5-325 MG PO TABS: 1.0000 | ORAL_TABLET | ORAL | 0 refills | Status: DC | PRN

## 2016-03-08 MED ORDER — OXYCODONE HCL 5 MG PO TABS
5.0000 mg | ORAL_TABLET | ORAL | Status: DC | PRN
  Administered 2016-03-08: 5 mg via ORAL
  Filled 2016-03-08: qty 1

## 2016-03-08 MED ORDER — OXYCODONE HCL 5 MG PO TABS: 5.0000 mg | ORAL_TABLET | ORAL | 0 refills | Status: DC | PRN

## 2016-03-08 NOTE — Progress Notes (Signed)
Subjective: 2 Days Post-Op Procedure(s) (LRB): CENTRAL DECOMPRESSION L3 - L4 ,L4 - L5 (N/A) Patient reports pain as 2 on 0-10 scale.Doing much better this evening. Will DC Folef. She was extremely weak this morning and is 86 yrs. Old. PT recommended another day in the hospital for additional PT. HBg is stanble at 9.4.She was hypotensive earlier.    Objective: Vital signs in last 24 hours: Temp:  [97.3 F (36.3 C)-98.8 F (37.1 C)] 97.3 F (36.3 C) (09/01 1354) Pulse Rate:  [85-98] 90 (09/01 1354) Resp:  [16-18] 16 (09/01 1354) BP: (110-137)/(44-55) 114/44 (09/01 1354) SpO2:  [98 %-100 %] 100 % (09/01 1354)  Intake/Output from previous day: 08/31 0701 - 09/01 0700 In: 2028.7 [P.O.:420; I.V.:1608.7] Out: 1525 [Urine:1525] Intake/Output this shift: Total I/O In: 1000 [P.O.:600; I.V.:400] Out: 300 [Urine:300]   Recent Labs  03/07/16 1449 03/08/16 0422  HGB 9.8* 9.4*    Recent Labs  03/07/16 1449 03/08/16 0422  WBC  --  4.8  RBC  --  3.21*  HCT 29.6* 29.0*  PLT  --  124*   No results for input(s): NA, K, CL, CO2, BUN, CREATININE, GLUCOSE, CALCIUM in the last 72 hours. No results for input(s): LABPT, INR in the last 72 hours.  Neurologically intact  Assessment/Plan: 2 Days Post-Op Procedure(s) (LRB): CENTRAL DECOMPRESSION L3 - L4 ,L4 - L5 (N/A) Plan for discharge tomorrow after additional PT.  [name] A 03/08/2016, 4:32 PM

## 2016-03-08 NOTE — Evaluation (Signed)
Occupational Therapy Evaluation Patient Details Name: [name] MRN: [id number] DOB: 02/11/1930 Today's Date: 03/08/2016    History of Present Illness s/p L3-4, L4-5 decompression   Clinical Impression   This 82 year old female underwent the above sx.  She was limited by pain during OT evaluation, but she is very motivated and participated well.  She was mod I prior to admission. Husband will assist her at home (he uses a cane). Goals in acute are for min guard level    Follow Up Recommendations  Supervision/Assistance - 24 hour;Home health OT    Equipment Recommendations  None recommended by OT    Recommendations for Other Services       Precautions / Restrictions Precautions Precautions: Fall;Back Restrictions Weight Bearing Restrictions: No      Mobility Bed Mobility               General bed mobility comments: oob (cues to scoot forward in chair; +2 to reposition in chair)  Transfers Overall transfer level: Needs assistance Equipment used: Rolling walker (2 wheeled) Transfers: Sit to/from Stand Sit to Stand: Min assist         General transfer comment: cues for back precautions; assist to rise and steady    Balance                                            ADL Overall ADL's : Needs assistance/impaired     Grooming: Set up;Supervision/safety;Sitting:  Pt has dentures.    Upper Body Bathing: Set up;Sitting   Lower Body Bathing: Moderate assistance;Sit to/from stand   Upper Body Dressing : Minimal assistance;Sitting   Lower Body Dressing: Maximal assistance;Sit to/from stand                 General ADL Comments: Pt's husband will assist with adls.  Educated on Financial trader.  Pt would benefit from toilet aide. Ambulated to bathroom door; did not practice commode transfer this session as pain increased and she still has catheter.  Pt wanted to continue sitting up in chair.       Vision     Perception      Praxis      Pertinent Vitals/Pain Pain Assessment: 0-10 Pain Score: 8  Pain Location: back Pain Descriptors / Indicators: Aching Pain Intervention(s): Limited activity within patient's tolerance;Monitored during session;Premedicated before session     Hand Dominance     Extremity/Trunk Assessment Upper Extremity Assessment Upper Extremity Assessment: Overall WFL for tasks assessed           Communication Communication Communication: No difficulties   Cognition Arousal/Alertness: Awake/alert Behavior During Therapy: WFL for tasks assessed/performed Overall Cognitive Status: Within Functional Limits for tasks assessed                     General Comments       Exercises       Shoulder Instructions      Home Living Family/patient expects to be discharged to:: Private residence Living Arrangements: Spouse/significant other (uses cane) Available Help at Discharge: Family Type of Home: House Home Access: Stairs to enter [employer] of Steps: 3 and 1 more into kitchen Entrance Stairs-Rails: Right Home Layout: One level     Bathroom Shower/Tub: Occupational psychologist: Standard     Home Equipment: Environmental consultant - 2 wheels;Cane -  single point;Bedside commode          Prior Functioning/Environment Level of Independence: Independent with assistive device(s)        Comments: amb with RW for last month     OT Diagnosis: Acute pain   OT Problem List: Decreased strength;Decreased activity tolerance;Decreased knowledge of precautions;Decreased knowledge of use of DME or AE;Pain   OT Treatment/Interventions: Self-care/ADL training;DME and/or AE instruction;Patient/family education;Therapeutic activities    OT Goals(Current goals can be found in the care plan section) Acute Rehab OT Goals Patient Stated Goal: decreased pain OT Goal Formulation: With patient Time For Goal Achievement: 03/15/16 Potential to Achieve Goals: Good ADL  Goals Pt Will Transfer to Toilet: with min guard assist;ambulating;bedside commode Pt Will Perform Toileting - Clothing Manipulation and hygiene: with min guard assist;sit to/from stand (simulating use of toilet aide) Pt Will Perform Tub/Shower Transfer: Shower transfer;with min guard assist;ambulating;3 in 1 (vs verbalize sequence) Additional ADL Goal #1: Pt will verbalize 3/3 back precautions  OT Frequency: Min 2X/week   Barriers to D/C:            Co-evaluation              End of Session Nurse Communication: Patient requests pain meds  Activity Tolerance: Patient limited by pain Patient left: in chair;with call bell/phone within reach;with chair alarm set;with family/visitor present   Time: [id number] OT Time Calculation (min): 28 min Charges:  OT General Charges $OT Visit: 1 Procedure OT Evaluation $OT Eval Low Complexity: 1 Procedure OT Treatments $Therapeutic Activity: 8-22 mins G-Codes:    Tiaria Biby 2016-03-15, 11:53 AM  Lesle Chris, OTR/L [phone] 03/15/2016

## 2016-03-08 NOTE — Progress Notes (Signed)
   Subjective: 2 Days Post-Op Procedure(s) (LRB): CENTRAL DECOMPRESSION L3 - L4 ,L4 - L5 (N/A) Patient reports pain as moderate.   Patient seen in rounds for Dr. Gladstone Lighter Patient is feeling fair this morning. She reports back pain. She denies dizziness and lightheadedness. No SOB or chest pain. Catheter in place with good yield. Positive flatus. Reports uneasiness about getting up with therapy and going home.    Objective: Vital signs in last 24 hours: Temp:  [98.1 F (36.7 C)-98.8 F (37.1 C)] 98.1 F (36.7 C) (09/01 0600) Pulse Rate:  [85-98] 85 (09/01 0600) Resp:  [16-20] 16 (09/01 0600) BP: (89-137)/(29-55) 111/52 (09/01 0600) SpO2:  [98 %-99 %] 99 % (09/01 0600)  Intake/Output from previous day:  Intake/Output Summary (Last 24 hours) at 03/08/16 0732 Last data filed at 03/08/16 0600  Gross per 24 hour  Intake          2028.67 ml  Output             1525 ml  Net           503.67 ml     Labs:  Recent Labs  03/07/16 1449 03/08/16 0422  HGB 9.8* 9.4*    Recent Labs  03/07/16 1449 03/08/16 0422  WBC  --  4.8  RBC  --  3.21*  HCT 29.6* 29.0*  PLT  --  124*    EXAM General - Patient is Alert and Oriented Extremity - Neurologically intact Intact pulses distally Dorsiflexion/Plantar flexion intact Compartment soft Dressing/Incision - clean, dry Motor Function - intact, moving foot and toes well on exam.   Past Medical History:  Diagnosis Date  . Arthritis   . Breast cancer ([place])   . Breast cancer of upper-outer quadrant of right female breast ([place]) 07/21/2015  . Colon polyps    adenomatous  . DDD (degenerative disc disease)   . Diabetes mellitus   . Diverticulosis   . Dyslipidemia   . Family history of breast cancer   . Family history of pancreatic cancer   . Family history of prostate cancer   . GERD (gastroesophageal reflux disease)   . Hemorrhoids   . History of blood transfusion   . Peripheral edema    ankles, takes lasix  . PONV  (postoperative nausea and vomiting)   . Radiation 09/13/15-10/12/15   42.72 gray to right breast with lumpectomy cavity boost of 12 gray  . Renal insufficiency   . Shingles     Assessment/Plan: 2 Days Post-Op Procedure(s) (LRB): CENTRAL DECOMPRESSION L3 - L4 ,L4 - L5 (N/A) Active Problems:   Spinal stenosis, lumbar region, with neurogenic claudication  Estimated body mass index is 29.1 kg/m as calculated from the following:   Height as of this encounter: 5\' 1"  (1.549 m).   Weight as of this encounter: 69.9 kg (154 lb). Advance diet Up with therapy  DVT Prophylaxis - Aspirin Weight-Bearing as tolerated   We will have her get up with therapy today. Bed rest discontinued. Will require close monitoring of BP while ambulating. Continue iron supplementation. Discontinue foley catheter after first session therapy. Patient will require two sessions of therapy today. Possible DC home this afternoon if she tolerates therapy with vitals holding steady. Possible delay of DC until tomorrow depending on progress.   Ardeen Jourdain, PA-C Orthopaedic Surgery 03/08/2016, 7:32 AM

## 2016-03-08 NOTE — Progress Notes (Signed)
Physical Therapy Treatment Patient Details Name: [name] MRN: [id number] DOB: 06/13/1930 Today's Date: 03/08/2016    History of Present Illness s/p L3-4, L4-5 decompression    PT Comments    Assisted pt out of recliner to amb in hallway, assisted to bathroom then back to bed.   Pt required increased time and repeat VC's to adhere to her back precautions.    Follow Up Recommendations  No PT follow up     Equipment Recommendations  None recommended by PT    Recommendations for Other Services       Precautions / Restrictions Precautions Precautions: Fall;Back Precaution Comments: reviewed back precautions several times during session Restrictions Weight Bearing Restrictions: No    Mobility  Bed Mobility Overal bed mobility: Needs Assistance Bed Mobility: Sit to Sidelying Rolling: Min assist Sidelying to sit: Mod assist     Sit to sidelying: Min assist;Mod assist General bed mobility comments: 75% VC's on proper tech to adhere to back precautions  Transfers Overall transfer level: Needs assistance Equipment used: Rolling walker (2 wheeled) Transfers: Sit to/from Stand Sit to Stand: Min assist         General transfer comment: cues for back precautions; assist to rise and steady with increased time  Ambulation/Gait Ambulation/Gait assistance: Min guard Ambulation Distance (Feet): 54 Feet Assistive device: Rolling walker (2 wheeled) Gait Pattern/deviations: Step-through pattern;Decreased stride length Gait velocity: decreased   General Gait Details: cues for back precautions with turns, RW safety.  Required increased time.     Stairs            Wheelchair Mobility    Modified Rankin (Stroke Patients Only)       Balance Overall balance assessment: Needs assistance           Standing balance-Leahy Scale: Poor Standing balance comment: reliant  on UEs                    Cognition Arousal/Alertness: Awake/alert Behavior  During Therapy: WFL for tasks assessed/performed Overall Cognitive Status: Within Functional Limits for tasks assessed                      Exercises      General Comments        Pertinent Vitals/Pain Pain Assessment: 0-10 Pain Score: 8  Pain Location: back Pain Descriptors / Indicators: Grimacing Pain Intervention(s): Limited activity within patient's tolerance;Monitored during session;Premedicated before session;Repositioned    Home Living Family/patient expects to be discharged to:: Private residence Living Arrangements: Spouse/significant other Available Help at Discharge: Family Type of Home: House Home Access: Stairs to enter Entrance Stairs-Rails: Right Home Layout: One level Home Equipment: Environmental consultant - 2 wheels;Cane - single point;Bedside commode      Prior Function Level of Independence: Independent with assistive device(s)      Comments: amb with RW for last month    PT Goals (current goals can now be found in the care plan section) Acute Rehab PT Goals Patient Stated Goal: decreased pain PT Goal Formulation: With patient/family Time For Goal Achievement: 03/12/16 Potential to Achieve Goals: Good Progress towards PT goals: Progressing toward goals    Frequency  Min 6X/week    PT Plan Current plan remains appropriate    Co-evaluation             End of Session Equipment Utilized During Treatment: Gait belt Activity Tolerance: Patient tolerated treatment well Patient left: in bed;with call bell/phone within reach;with bed alarm set;with family/visitor  present     Time: 1352-1416 PT Time Calculation (min) (ACUTE ONLY): 24 min  Charges:  $Gait Training: 8-22 mins $Therapeutic Activity: 8-22 mins                    G Codes:      Rica Koyanagi  PTA [facility]  Acute  Rehab Pager      [phone]

## 2016-03-08 NOTE — Evaluation (Signed)
Physical Therapy Evaluation Patient Details Name: [name] MRN: [id number] DOB: 03-Mar-1930 Today's Date: 03/08/2016   History of Present Illness  s/p L3-4, L4-5 decompression  Clinical Impression  Pt admitted with above diagnosis. Pt currently with functional limitations due to the deficits listed below (see PT Problem List). * Pt will benefit from skilled PT to increase their independence and safety with mobility to allow discharge to the venue listed below.    Pt will benefit from continued PT in acute setting, likely will need one more day to allow incr IND prior to D/C  Follow Up Recommendations No PT follow up;Supervision for mobility/OOB    Equipment Recommendations  None recommended by PT    Recommendations for Other Services       Precautions / Restrictions Precautions Precautions: Fall;Back Restrictions Weight Bearing Restrictions: No      Mobility  Bed Mobility Overal bed mobility: Needs Assistance Bed Mobility: Sidelying to Sit;Rolling Rolling: Min assist Sidelying to sit: Mod assist       General bed mobility comments: cues for back precautions, technique  Transfers Overall transfer level: Needs assistance Equipment used: Rolling walker (2 wheeled) Transfers: Sit to/from Stand Sit to Stand: Min assist         General transfer comment: cues for back precautions; assist to rise and steady  Ambulation/Gait Ambulation/Gait assistance: Min assist Ambulation Distance (Feet): 60 Feet Assistive device: Rolling walker (2 wheeled) Gait Pattern/deviations: Step-to pattern;Step-through pattern;Decreased stride length     General Gait Details: cues for back precautions with turns, RW safety  Stairs            Wheelchair Mobility    Modified Rankin (Stroke Patients Only)       Balance Overall balance assessment: Needs assistance           Standing balance-Leahy Scale: Poor Standing balance comment: reliant  on UEs                              Pertinent Vitals/Pain Pain Assessment: 0-10 Pain Score: 8  Pain Location: back Pain Descriptors / Indicators: Grimacing Pain Intervention(s): Limited activity within patient's tolerance;Monitored during session;Premedicated before session;Repositioned    Home Living Family/patient expects to be discharged to:: Private residence Living Arrangements: Spouse/significant other Available Help at Discharge: Family Type of Home: House Home Access: Stairs to enter Entrance Stairs-Rails: Right Entrance Stairs-Number of Steps: 3 and 1 more into kitchen Home Layout: One level Home Equipment: Environmental consultant - 2 wheels;Cane - single point;Bedside commode      Prior Function Level of Independence: Independent with assistive device(s)         Comments: amb with RW for last month      Hand Dominance        Extremity/Trunk Assessment   Upper Extremity Assessment: Defer to OT evaluation           Lower Extremity Assessment: Generalized weakness         Communication   Communication: No difficulties  Cognition Arousal/Alertness: Awake/alert Behavior During Therapy: WFL for tasks assessed/performed Overall Cognitive Status: Within Functional Limits for tasks assessed                      General Comments      Exercises        Assessment/Plan    PT Assessment Patient needs continued PT services  PT Diagnosis Difficulty walking   PT Problem List Decreased strength;Decreased range  of motion;Decreased activity tolerance;Decreased mobility;Decreased knowledge of use of DME;Decreased knowledge of precautions  PT Treatment Interventions DME instruction;Gait training;Functional mobility training;Therapeutic exercise;Therapeutic activities;Patient/family education;Stair training   PT Goals (Current goals can be found in the Care Plan section) Acute Rehab PT Goals Patient Stated Goal: decreased pain PT Goal Formulation: With patient/family Time For Goal  Achievement: 03/12/16 Potential to Achieve Goals: Good    Frequency Min 6X/week   Barriers to discharge        Co-evaluation               End of Session Equipment Utilized During Treatment: Gait belt Activity Tolerance: Patient tolerated treatment well Patient left: in bed;with call bell/phone within reach           Time: 0953-1019 PT Time Calculation (min) (ACUTE ONLY): 26 min   Charges:   PT Evaluation $PT Eval Low Complexity: 1 Procedure PT Treatments $Gait Training: 8-22 mins   PT G Codes:        Shawnique Mariotti 03-16-16, 1:12 PM

## 2016-03-09 LAB — GLUCOSE, CAPILLARY
Glucose-Capillary: 123 mg/dL — ABNORMAL HIGH (ref 65–99)
Glucose-Capillary: 175 mg/dL — ABNORMAL HIGH (ref 65–99)

## 2016-03-09 NOTE — Progress Notes (Signed)
Discharged from floor via w/c for transport home by car. Belongings & family with pt. No changes in assessment. Zani Kyllonen  

## 2016-03-09 NOTE — Progress Notes (Signed)
Physical Therapy Treatment Patient Details Name: [name] MRN: [id number] DOB: [birth year] Today's Date: 04/08/16    History of Present Illness s/p L3-4, L4-5 decompression    PT Comments    Progressing, would benefit from HHPT   Follow Up Recommendations  Home health PT;Supervision for mobility/OOB     Equipment Recommendations  None recommended by PT    Recommendations for Other Services       Precautions / Restrictions Precautions Precautions: Fall;Back Precaution Comments: reviewed back precautions several times during session Restrictions Weight Bearing Restrictions: No    Mobility  Bed Mobility               General bed mobility comments: pt in chair  Transfers Overall transfer level: Needs assistance Equipment used: Rolling walker (2 wheeled) Transfers: Sit to/from Stand Sit to Stand: Min guard;Min assist Stand pivot transfers: Min assist       General transfer comment: cues for back precautions; assist to rise and steady with increased time  Ambulation/Gait Ambulation/Gait assistance: Min assist;Min guard Ambulation Distance (Feet): 60 Feet Assistive device: Rolling walker (2 wheeled) Gait Pattern/deviations: Step-to pattern;Step-through pattern;Trunk flexed Gait velocity: decreased/very slow   General Gait Details: cues for back precautions with turns, RW safety.  Required increased time.     Stairs Stairs: Yes Stairs assistance: Min guard Stair Management: One rail Right;Step to pattern;Forwards (HHA) Number of Stairs: 5 (x2) General stair comments: cues for sequence and safety  Wheelchair Mobility    Modified Rankin (Stroke Patients Only)       Balance             Standing balance-Leahy Scale: Poor Standing balance comment: reliant on UEs although less so than yesterday                    Cognition Arousal/Alertness: Awake/alert Behavior During Therapy: WFL for tasks assessed/performed Overall Cognitive  Status: Within Functional Limits for tasks assessed                      Exercises      General Comments        Pertinent Vitals/Pain Pain Assessment: Faces Pain Score: 6  Faces Pain Scale: Hurts little more Pain Location: back Pain Descriptors / Indicators: Grimacing;Guarding Pain Intervention(s): Limited activity within patient's tolerance;Monitored during session;Premedicated before session    Home Living                      Prior Function            PT Goals (current goals can now be found in the care plan section) Acute Rehab PT Goals Patient Stated Goal: decreased pain PT Goal Formulation: With patient/family Time For Goal Achievement: 03/12/16 Potential to Achieve Goals: Good Progress towards PT goals: Progressing toward goals    Frequency  Min 6X/week    PT Plan Current plan remains appropriate;Discharge plan needs to be updated    Co-evaluation             End of Session Equipment Utilized During Treatment: Gait belt Activity Tolerance: Patient tolerated treatment well Patient left: in chair;with call bell/phone within reach;with family/visitor present     Time: [id number] PT Time Calculation (min) (ACUTE ONLY): 41 min  Charges:  $Gait Training: 38-52 mins                    G Codes:      Benoit Meech 04-08-16, 12:11 PM

## 2016-03-09 NOTE — Progress Notes (Signed)
Patient ID: [name], female   DOB: 1929-07-28, 82 y.o.   MRN: [id number]    Subjective: 3 Days Post-Op Procedure(s) (LRB): CENTRAL DECOMPRESSION L3 - L4 ,L4 - L5 (N/A) Patient reports pain as 7 on 0-10 scale.   Denies CP or SOB.  Voiding without difficulty. Positive flatus. Objective: Vital signs in last 24 hours: Temp:  [97.3 F (36.3 C)-99.1 F (37.3 C)] 99.1 F (37.3 C) (09/02 0540) Pulse Rate:  [90-99] 93 (09/02 0540) Resp:  [16] 16 (09/02 0540) BP: (110-137)/(44-54) 110/48 (09/02 0540) SpO2:  [99 %-100 %] 99 % (09/02 0540)  Intake/Output from previous day: 09/01 0701 - 09/02 0700 In: 2960 [P.O.:960; I.V.:2000] Out: 1575 [Urine:1575] Intake/Output this shift: No intake/output data recorded.  Labs:  Recent Labs  03/07/16 1449 03/08/16 0422  HGB 9.8* 9.4*    Recent Labs  03/07/16 1449 03/08/16 0422  WBC  --  4.8  RBC  --  3.21*  HCT 29.6* 29.0*  PLT  --  124*   No results for input(s): NA, K, CL, CO2, BUN, CREATININE, GLUCOSE, CALCIUM in the last 72 hours. No results for input(s): LABPT, INR in the last 72 hours.  Physical Exam: Neurologically intact ABD soft Sensation intact distally Dorsiflexion/Plantar flexion intact Incision: no drainage Compartment soft  Assessment/Plan: 3 Days Post-Op Procedure(s) (LRB): CENTRAL DECOMPRESSION L3 - L4 ,L4 - L5 (N/A) Advance diet Up with therapy Discharge home with home health  May DC home after cleared by PT   Jerika Wales, Darla Lesches for Dr. Melina Schools [facility] [phone] 03/09/2016, 8:33 AM

## 2016-03-09 NOTE — Progress Notes (Signed)
Occupational Therapy Treatment Patient Details Name: [name] MRN: [id number] DOB: 1930-02-08 Today's Date: 03/09/2016    History of present illness s/p L3-4, L4-5 decompression   OT comments    Follow Up Recommendations  Supervision/Assistance - 24 hour;Home health OT    Equipment Recommendations  None recommended by OT    Recommendations for Other Services  daughter present for tx.  Questions answered    Precautions / Restrictions Precautions Precautions: Fall;Back Precaution Comments: reviewed back precautions several times during session Restrictions Weight Bearing Restrictions: No       Mobility Bed Mobility               General bed mobility comments: pt in chair  Transfers Overall transfer level: Needs assistance Equipment used: Rolling walker (2 wheeled) Transfers: Sit to/from [employer] Transfers Sit to Stand: Min assist Stand pivot transfers: Min assist       General transfer comment: cues for back precautions; assist to rise and steady with increased time    Balance                                   ADL Overall ADL's : Needs assistance/impaired     Grooming: Set up;Supervision/safety;Sitting       Lower Body Bathing: Minimal assistance;Sit to/from stand;Cueing for safety;Cueing for sequencing   Upper Body Dressing : Set up;Sitting   Lower Body Dressing: Moderate assistance;Sit to/from stand;Cueing for safety;Cueing for sequencing   Toilet Transfer: Min guard;Comfort height toilet;RW   Toileting- Water quality scientist and Hygiene: Min guard;Sit to/from stand;Cueing for safety       Functional mobility during ADLs: Min guard General ADL Comments: Daughter will A as needed.  Niece obtaining toilet aid and reacher for pt           Extremity/Trunk Assessment                     General Comments      Pertinent Vitals/ Pain       Pain Score: 6  Pain Location: back Pain Descriptors /  Indicators: Sore Pain Intervention(s): Limited activity within patient's tolerance;Repositioned     Prior Functioning/Environment              Frequency Min 2X/week     Progress Toward Goals  OT Goals(current goals can now be found in the care plan section)  Progress towards OT goals: Progressing toward goals     Plan Discharge plan remains appropriate    Co-evaluation                 End of Session     Activity Tolerance Patient limited by pain   Patient Left in chair;with call bell/phone within reach;with chair alarm set;with family/visitor present   Nurse Communication Patient requests pain meds        Time: 1110-1147 OT Time Calculation (min): 37 min  Charges: OT Treatments $Self Care/Home Management : 23-37 mins  Keyana Guevara D 03/09/2016, 12:00 PM

## 2016-03-09 NOTE — Care Management Note (Signed)
Case Management Note  Patient Details  Name: [name] MRN: [id number] Date of Birth: [birth year]  Subjective/Objective:                  L3-4, L4-5 decompression Action/Plan: Discharge planning Expected Discharge Date:  03/09/16               Expected Discharge Plan:  [place]  In-House Referral:     Discharge planning Services  CM Consult  Post Acute Care Choice:  Home Health Choice offered to:  Adult Children, Patient  DME Arranged:  N/A DME Agency:  NA  HH Arranged:  PT [place] Agency:  [facility] (formerly [employer])  Status of Service:  Completed, signed off  If discussed at [employer], dates discussed:    Additional Comments: CM spoke with daughter and pt to offer choice of home health agency.  Family chooses [facility] to render HHPT.  Referral called to [employer], [place].  No other CM needs were communicated. Dellie Catholic, RN 03/09/2016, 1:24 PM

## 2016-03-12 DIAGNOSIS — Z9889 Other specified postprocedural states: Secondary | ICD-10-CM | POA: Diagnosis not present

## 2016-03-12 DIAGNOSIS — M4807 Spinal stenosis, lumbosacral region: Secondary | ICD-10-CM | POA: Diagnosis not present

## 2016-03-12 DIAGNOSIS — Z4789 Encounter for other orthopedic aftercare: Secondary | ICD-10-CM | POA: Diagnosis not present

## 2016-03-13 ENCOUNTER — Emergency Department (HOSPITAL_COMMUNITY)
Admission: EM | Admit: 2016-03-13 | Discharge: 2016-03-14 | Disposition: A | Discharge status: Home / Self Care | Payer: Medicare Other | Attending: Emergency Medicine | Admitting: Emergency Medicine

## 2016-03-13 DIAGNOSIS — Z7982 Long term (current) use of aspirin: Secondary | ICD-10-CM | POA: Diagnosis not present

## 2016-03-13 DIAGNOSIS — E039 Hypothyroidism, unspecified: Secondary | ICD-10-CM | POA: Diagnosis not present

## 2016-03-13 DIAGNOSIS — Z4789 Encounter for other orthopedic aftercare: Secondary | ICD-10-CM | POA: Diagnosis not present

## 2016-03-13 DIAGNOSIS — M519 Unspecified thoracic, thoracolumbar and lumbosacral intervertebral disc disorder: Secondary | ICD-10-CM | POA: Diagnosis not present

## 2016-03-13 DIAGNOSIS — Z853 Personal history of malignant neoplasm of breast: Secondary | ICD-10-CM | POA: Insufficient documentation

## 2016-03-13 DIAGNOSIS — M1990 Unspecified osteoarthritis, unspecified site: Secondary | ICD-10-CM | POA: Diagnosis not present

## 2016-03-13 DIAGNOSIS — Z79899 Other long term (current) drug therapy: Secondary | ICD-10-CM | POA: Insufficient documentation

## 2016-03-13 DIAGNOSIS — K5900 Constipation, unspecified: Secondary | ICD-10-CM | POA: Diagnosis not present

## 2016-03-13 DIAGNOSIS — M4806 Spinal stenosis, lumbar region: Secondary | ICD-10-CM | POA: Diagnosis not present

## 2016-03-13 DIAGNOSIS — F419 Anxiety disorder, unspecified: Secondary | ICD-10-CM | POA: Diagnosis not present

## 2016-03-13 DIAGNOSIS — E119 Type 2 diabetes mellitus without complications: Secondary | ICD-10-CM | POA: Diagnosis not present

## 2016-03-13 DIAGNOSIS — R339 Retention of urine, unspecified: Secondary | ICD-10-CM | POA: Insufficient documentation

## 2016-03-13 NOTE — ED Notes (Signed)
Bed: [id number] Expected date:  Expected time:  Means of arrival:  Comments: EMS 82yo recent back surgery / no BM x several days

## 2016-03-13 NOTE — ED Triage Notes (Signed)
Per PTAR, pt is from home. Pt is having trouble urinating and hasn't had a bowel movement in several days. Pt recently had back surgery and was taking percocet and then it was switched to norco. Pt reports using stool softeners daily with no bowel movement.

## 2016-03-14 ENCOUNTER — Encounter (HOSPITAL_COMMUNITY): Payer: Self-pay | Admitting: Emergency Medicine

## 2016-03-14 DIAGNOSIS — R339 Retention of urine, unspecified: Secondary | ICD-10-CM | POA: Diagnosis not present

## 2016-03-14 LAB — I-STAT CHEM 8, ED
BUN: 20 mg/dL (ref 6–20)
Calcium, Ion: 1.18 mmol/L (ref 1.15–1.40)
Chloride: 92 mmol/L — ABNORMAL LOW (ref 101–111)
Creatinine, Ser: 1.3 mg/dL — ABNORMAL HIGH (ref 0.44–1.00)
Glucose, Bld: 209 mg/dL — ABNORMAL HIGH (ref 65–99)
HCT: 33 % — ABNORMAL LOW (ref 36.0–46.0)
Hemoglobin: 11.2 g/dL — ABNORMAL LOW (ref 12.0–15.0)
Potassium: 3.7 mmol/L (ref 3.5–5.1)
Sodium: 135 mmol/L (ref 135–145)
TCO2: 30 mmol/L (ref 0–100)

## 2016-03-14 LAB — CBC WITH DIFFERENTIAL/PLATELET
Basophils Absolute: 0 10*3/uL (ref 0.0–0.1)
Basophils Relative: 0 %
Eosinophils Absolute: 0.2 10*3/uL (ref 0.0–0.7)
Eosinophils Relative: 3 %
HCT: 32.2 % — ABNORMAL LOW (ref 36.0–46.0)
Hemoglobin: 10.7 g/dL — ABNORMAL LOW (ref 12.0–15.0)
Lymphocytes Relative: 23 %
Lymphs Abs: 1 10*3/uL (ref 0.7–4.0)
MCH: 29.6 pg (ref 26.0–34.0)
MCHC: 33.2 g/dL (ref 30.0–36.0)
MCV: 89.2 fL (ref 78.0–100.0)
Monocytes Absolute: 0.3 10*3/uL (ref 0.1–1.0)
Monocytes Relative: 7 %
Neutro Abs: 3 10*3/uL (ref 1.7–7.7)
Neutrophils Relative %: 67 %
Platelets: 300 10*3/uL (ref 150–400)
RBC: 3.61 MIL/uL — ABNORMAL LOW (ref 3.87–5.11)
RDW: 13.9 % (ref 11.5–15.5)
WBC: 4.6 10*3/uL (ref 4.0–10.5)

## 2016-03-14 LAB — URINALYSIS, ROUTINE W REFLEX MICROSCOPIC
Bilirubin Urine: NEGATIVE
Glucose, UA: NEGATIVE mg/dL
Hgb urine dipstick: NEGATIVE
Ketones, ur: NEGATIVE mg/dL
Leukocytes, UA: NEGATIVE
Nitrite: NEGATIVE
Protein, ur: NEGATIVE mg/dL
Specific Gravity, Urine: 1.009 (ref 1.005–1.030)
pH: 6 (ref 5.0–8.0)

## 2016-03-14 MED ORDER — TAMSULOSIN HCL 0.4 MG PO CAPS: 0.4000 mg | ORAL_CAPSULE | Freq: Every day | ORAL | 0 refills | Status: DC

## 2016-03-14 NOTE — ED Provider Notes (Addendum)
[facility] DEPT Provider Note   CSN: [id number] Arrival date & time: 03/13/16  2349  By signing my name below, I, Higinio Plan, attest that this documentation has been prepared under the direction and in the presence of Lashara Urey, MD . Electronically Signed: Higinio Plan, Scribe. 03/14/2016. 12:39 AM.  History   Chief Complaint Chief Complaint  Patient presents with  . Urinary Retention  . Constipation   The history is provided by the patient. No language interpreter was used.   HPI Comments: [name] is a 82 y.o. female with PMHx of breast cancer and DM, who presents to the Emergency Department complaining of gradually worsening, urinary retention s/p back surgery that began 5 days ago and worsened today. Pt reports she is only able to urinate "a few drops" at a time; she notes the last time she urinated was this morning. She states associated constipation; she reports she is currently taking Miralax and Dulcolax with no relief. She denies dysuria or foul odor. Pt reports PSHx of lumbar laminectomy and decompression due to spinal stenosis on 03/06/16 by Dr. Gladstone Lighter; she notes she stayed 3 days in the hospital after her operation. Pt states she had a foley catheter placed and was urinating without difficulty until discharge. She reports she is currently alternating between hydrocodone and oxycodone with no relief. She denies use of muscle relaxers.   Past Medical History:  Diagnosis Date  . Arthritis   . Breast cancer ([place])   . Breast cancer of upper-outer quadrant of right female breast ([place]) 07/21/2015  . Colon polyps    adenomatous  . DDD (degenerative disc disease)   . Diabetes mellitus   . Diverticulosis   . Dyslipidemia   . Family history of breast cancer   . Family history of pancreatic cancer   . Family history of prostate cancer   . GERD (gastroesophageal reflux disease)   . Hemorrhoids   . History of blood transfusion   . Peripheral edema    ankles, takes lasix    . PONV (postoperative nausea and vomiting)   . Radiation 09/13/15-10/12/15   42.72 gray to right breast with lumpectomy cavity boost of 12 gray  . Renal insufficiency   . Shingles     Patient Active Problem List   Diagnosis Date Noted  . Spinal stenosis, lumbar region, with neurogenic claudication 03/06/2016  . Malignant neoplasm of right female breast ([place]) 01/11/2016  . Controlled type 2 diabetes mellitus without complication, without long-term current use of insulin ([place]) 01/11/2016  . Genetic testing 08/18/2015  . Family history of breast cancer   . Family history of prostate cancer   . Family history of pancreatic cancer   . Breast cancer of upper-outer quadrant of right female breast ([place]) 07/21/2015  . Insomnia 05/06/2015  . Medication management 12/16/2013  . Hand arthritis 12/16/2013  . Rectal bleeding 12/16/2013  . Cold intolerance 06/17/2013  . Renal insufficiency 12/23/2012  . Post-menopause 12/23/2012  . Hyperlipidemia 12/23/2012  . Postmenopausal HRT (hormone replacement therapy) 12/23/2012  . History of shingles 12/23/2012  . Wears hearing aid 12/23/2012  . Nonspecific abnormal unspecified cardiovascular function study 02/28/2012  . Chronic sore throat 01/13/2012  . Hoarse 01/13/2012  . Chest pressure 12/03/2011  . Itching  mid back 12/03/2011  . Abnormal EKG 12/03/2011  . Medicare annual wellness visit, subsequent 12/03/2011  . DJD (degenerative joint disease) 12/03/2011  . DIABETES MELLITUS, TYPE II, CONTROLLED 11/15/2009  . VITAMIN D DEFICIENCY 11/15/2009  . EXOGENOUS OBESITY  11/15/2009  . ANXIETY DEPRESSION 12/22/2008  . HYPERTHYROIDISM 08/23/2008  . TOTAL KNEE REPLACEMENT, LEFT, HX OF 08/23/2008  . HYPOTHYROIDISM 10/08/2007  . ANEMIA 10/08/2007  . ARTHRITIS 10/08/2007  . OSTEOPENIA 10/08/2007  . HYPERLIPIDEMIA 11/07/2006  . GERD 11/07/2006    Past Surgical History:  Procedure Laterality Date  . BREAST LUMPECTOMY WITH RADIOACTIVE SEED LOCALIZATION  Right 08/03/2015   Procedure: BREAST LUMPECTOMY WITH RADIOACTIVE SEED LOCALIZATION;  Surgeon: [name], MD;  Location: [facility];  Service: General;  Laterality: Right;  . CARPAL TUNNEL RELEASE Right 1977  . CATARACT EXTRACTION W/ INTRAOCULAR LENS  IMPLANT, BILATERAL Bilateral    left 1996; right 1997  . GANGLION CYST EXCISION Right 2003   hand  . JOINT REPLACEMENT    . LUMBAR LAMINECTOMY/DECOMPRESSION MICRODISCECTOMY N/A 03/06/2016   Procedure: CENTRAL DECOMPRESSION L3 - L4 ,L4 - L5;  Surgeon: [name], MD;  Location: [facility];  Service: Orthopedics;  Laterality: N/A;  . TOTAL KNEE ARTHROPLASTY Right 2003  . TOTAL KNEE ARTHROPLASTY Left 2010  . TOTAL SHOULDER ARTHROPLASTY Right 2010  . [place]  . VAGINAL HYSTERECTOMY  1975    OB History    No data available       Home Medications    Prior to Admission medications   Medication Sig Start Date End Date Taking? Authorizing Provider  aspirin 325 MG tablet Take 1 tablet (325 mg total) by mouth 2 (two) times daily. To prevent blood clots 03/07/16   Amber Constable, PA-C  atorvastatin (LIPITOR) 10 MG tablet Take 1 tablet (10 mg total) by mouth daily. Patient taking differently: Take 10 mg by mouth every other day.  05/01/15   Burnis Medin, MD  Blood Glucose Calibration (OT ULTRA/FASTTK CNTRL SOLN) SOLN Use as instructed 12/23/12   Burnis Medin, MD  Cyanocobalamin (B-12 PO) Take 1 tablet by mouth daily.    Historical Provider, MD  esomeprazole (NEXIUM) 40 MG capsule TAKE 1 CAPSULE DAILY BEFOREBREAKFAST Patient taking differently: Take 40 mg by mouth every other day.  05/01/15   Burnis Medin, MD  ferrous sulfate 325 (65 FE) MG tablet Take 1 tablet (325 mg total) by mouth 2 (two) times daily with a meal. 03/08/16   Amber Constable, PA-C  furosemide (LASIX) 40 MG tablet TAKE 1 TABLET DAILY FOR    EDEMA Patient taking differently: Take 20 mg by mouth daily.  05/01/15   Burnis Medin, MD    gabapentin (NEURONTIN) 300 MG capsule TAKE 1 CAPSULE(300 MG) BY MOUTH AT BEDTIME 02/13/16   Burnis Medin, MD  glipiZIDE (GLUCOTROL XL) 5 MG 24 hr tablet Take 1 tablet (5 mg total) by mouth daily. 05/01/15   Burnis Medin, MD  glucose blood (ONETOUCH VERIO) test strip Use to test blood sugar twice daily 05/01/15   Burnis Medin, MD  HYDROcodone-acetaminophen (NORCO/VICODIN) 5-325 MG tablet Take 1-2 tablets by mouth every 4 (four) hours as needed (mild pain). 03/08/16   Amber Cecilio Asper, PA-C  ONE TOUCH LANCETS MISC Use to test blood sugar twice daily 05/01/15   Burnis Medin, MD  orphenadrine (NORFLEX) 100 MG tablet Take 1 tablet (100 mg total) by mouth 2 (two) times daily. Patient not taking: Reported on 02/23/2016 12/07/92   Delora Fuel, MD  oxyCODONE (OXY IR/ROXICODONE) 5 MG immediate release tablet Take 1-2 tablets (5-10 mg total) by mouth every 4 (four) hours as needed for breakthrough pain. 03/08/16   Ardeen Jourdain, PA-C  Family History Family History  Problem Relation Age of Onset  . Pancreatic cancer Mother 41  . Diabetes Mother   . Prostate cancer Brother   . Diabetes Father   . Heart disease Father   . Diabetes Brother   . Heart attack Brother   . Diabetes Sister   . Diabetes Daughter   . Diabetes Son   . Coronary artery disease Brother     MI in his 85s  . Colon cancer Neg Hx   . Stomach cancer Neg Hx     Social History Social History  Substance Use Topics  . Smoking status: Never Smoker  . Smokeless tobacco: Never Used  . Alcohol use No     Allergies   Review of patient's allergies indicates no known allergies.   Review of Systems Review of Systems  Constitutional: Negative for fever.  Gastrointestinal: Negative for vomiting.  Genitourinary: Positive for decreased urine volume and difficulty urinating. Negative for dysuria, flank pain, frequency and hematuria.  All other systems reviewed and are negative.  Physical Exam Updated Vital Signs BP 153/69 (BP  Location: Left Arm)   Pulse 98   Temp 98.3 F (36.8 C) (Oral)   Resp 22   Ht 5\' 1"  (1.549 m)   Wt 155 lb (70.3 kg)   SpO2 95%   BMI 29.29 kg/m   Physical Exam  Constitutional: She appears well-developed and well-nourished. No distress.  HENT:  Head: Normocephalic.  Mouth/Throat: Oropharynx is clear and moist. No oropharyngeal exudate.  Eyes: Conjunctivae and EOM are normal. Pupils are equal, round, and reactive to light. Right eye exhibits no discharge. Left eye exhibits no discharge. No scleral icterus.  Neck: Normal range of motion. Neck supple. No JVD present. No tracheal deviation present.  Cardiovascular: Normal rate, regular rhythm, normal heart sounds and intact distal pulses.   No murmur heard. Pulmonary/Chest: Effort normal and breath sounds normal. No stridor. No respiratory distress. She has no wheezes. She has no rales.  Lungs CTA bilaterally.  Abdominal: Soft. She exhibits no distension. There is tenderness. There is no rebound and no guarding.  Hyperactive bowel sounds   Lymphadenopathy:    She has no cervical adenopathy.  Neurological: She is alert. She has normal reflexes. She displays normal reflexes. She exhibits normal muscle tone.  Skin: Skin is warm.  Psychiatric: She has a normal mood and affect. Her behavior is normal.  Nursing note and vitals reviewed.  ED Treatments / Results  Labs (all labs ordered are listed, but only abnormal results are displayed) Labs Reviewed  CBC WITH DIFFERENTIAL/PLATELET  URINALYSIS, ROUTINE W REFLEX MICROSCOPIC (NOT AT [facility])  I-STAT CHEM 8, ED    EKG  EKG Interpretation None       Radiology No results found.  Procedures Procedures  DIAGNOSTIC STUDIES:  Oxygen Saturation is 95% on RA, normal by my interpretation.    COORDINATION OF CARE:  12:37 AM Discussed treatment plan with pt at bedside and pt agreed to plan.  1:48 AM Pt told to choose one or the other, hydrocodone or oxycodone for pain relief. Will  leave Foley catheter in and stop Flomax.   Medications Ordered in ED Medications - No data to display   Initial Impression / Assessment and Plan / ED Course  I have reviewed the triage vital signs and the nursing notes.  Pertinent labs & imaging results that were available during my care of the patient were reviewed by me and considered in my medical decision making (see  chart for details).  Clinical Course   Vitals:   03/13/16 2354 03/14/16 0330  BP: 153/69 143/80  Pulse: 98 102  Resp: 22 16  Temp: 98.3 F (36.8 C) 98.8 F (37.1 C)   Results for orders placed or performed during the hospital encounter of 03/13/16  CBC with Differential/Platelet  Result Value Ref Range   WBC 4.6 4.0 - 10.5 K/uL   RBC 3.61 (L) 3.87 - 5.11 MIL/uL   Hemoglobin 10.7 (L) 12.0 - 15.0 g/dL   HCT 32.2 (L) 36.0 - 46.0 %   MCV 89.2 78.0 - 100.0 fL   MCH 29.6 26.0 - 34.0 pg   MCHC 33.2 30.0 - 36.0 g/dL   RDW 13.9 11.5 - 15.5 %   Platelets 300 150 - 400 K/uL   Neutrophils Relative % 67 %   Neutro Abs 3.0 1.7 - 7.7 K/uL   Lymphocytes Relative 23 %   Lymphs Abs 1.0 0.7 - 4.0 K/uL   Monocytes Relative 7 %   Monocytes Absolute 0.3 0.1 - 1.0 K/uL   Eosinophils Relative 3 %   Eosinophils Absolute 0.2 0.0 - 0.7 K/uL   Basophils Relative 0 %   Basophils Absolute 0.0 0.0 - 0.1 K/uL  Urinalysis, Routine w reflex microscopic (not at [facility])  Result Value Ref Range   Color, Urine YELLOW YELLOW   APPearance CLEAR CLEAR   Specific Gravity, Urine 1.009 1.005 - 1.030   pH 6.0 5.0 - 8.0   Glucose, UA NEGATIVE NEGATIVE mg/dL   Hgb urine dipstick NEGATIVE NEGATIVE   Bilirubin Urine NEGATIVE NEGATIVE   Ketones, ur NEGATIVE NEGATIVE mg/dL   Protein, ur NEGATIVE NEGATIVE mg/dL   Nitrite NEGATIVE NEGATIVE   Leukocytes, UA NEGATIVE NEGATIVE  I-stat chem 8, ed  Result Value Ref Range   Sodium 135 135 - 145 mmol/L   Potassium 3.7 3.5 - 5.1 mmol/L   Chloride 92 (L) 101 - 111 mmol/L   BUN 20 6 - 20 mg/dL    Creatinine, Ser 1.30 (H) 0.44 - 1.00 mg/dL   Glucose, Bld 209 (H) 65 - 99 mg/dL   Calcium, Ion 1.18 1.15 - 1.40 mmol/L   TCO2 30 0 - 100 mmol/L   Hemoglobin 11.2 (L) 12.0 - 15.0 g/dL   HCT 33.0 (L) 36.0 - 46.0 %   Dg Chest 2 View  Result Date: 02/27/2016 CLINICAL DATA:  Preoperative evaluation for lumbar spine surgery. EXAM: CHEST  2 VIEW COMPARISON:  Chest radiograph 03/10/2008 FINDINGS: Stable cardiac and mediastinal contours. Calcification of the thoracic aorta. Linear opacity left lung base. Thoracic spine degenerative changes. No pleural effusion or pneumothorax. IMPRESSION: Left basilar scarring and/or atelectasis. No acute cardiopulmonary process. Aortic vascular calcifications. Electronically Signed   By: Lovey Newcomer M.D.   On: 02/27/2016 13:51   Dg Lumbar Spine 2-3 Views  Result Date: 02/27/2016 CLINICAL DATA:  Preop or L3-4, L4-5 laminectomy and fusion EXAM: LUMBAR SPINE - 2-3 VIEW COMPARISON:  CT lumbar spine of 01/23/2016 FINDINGS: Anterolisthesis of L3 on L4 by 5 mm and L4 on L5 by 10 mm is noted. There is degenerative disc disease at L3-4 and L4-5. The remainder of intervertebral disc spaces are unremarkable. Moderate abdominal aortic atherosclerosis is present. Degenerative changes are present in the hips, left-greater-than-right. IMPRESSION: 1. Anterolistheses of L3 on L4 and L4 on L5 with degenerative disc disease at L3-4 and L4-5. 2. Moderate abdominal aortic atherosclerosis. Electronically Signed   By: Ivar Drape M.D.   On: 02/27/2016 13:54  Dg Spine Portable 1 View  Result Date: 03/06/2016 CLINICAL DATA:  Laminectomy EXAM: PORTABLE SPINE - 1 VIEW COMPARISON:  Study obtained earlier in the day FINDINGS: Cross-table lateral lumbar spine image labeled #3 submitted. The more superiorly placed probe extends inferiorly with the tip posterior to the superior aspect of the L4 vertebral body. The more inferiorly placed probe extends superiorly with the tip posterior to the inferior  aspect of the L3 vertebral body. Spondylolisthesis at L3-4 L4-5 remain. There is aortic atherosclerosis. IMPRESSION: Metallic probe tips are posterior to the superior aspect of the L4 vertebral body and posterior to the inferior aspect of the L3 vertebral body respectively. Stable spondylolisthesis at L3-4 L4-5. Aortic atherosclerosis. Electronically Signed   By: Lowella Grip III M.D.   On: 03/06/2016 08:47   Dg Spine Portable 1 View  Result Date: 03/06/2016 CLINICAL DATA:  Laminectomy EXAM: PORTABLE SPINE - 1 VIEW COMPARISON:  Study obtained earlier in the day FINDINGS: Cross-table lateral lumbar image labeled #2 submitted. Metallic probe tips overlie the L3 and L4 spinous processes respectively. There remains spondylolisthesis at L3-4 and L4-5. There is atherosclerotic calcification in the aorta. IMPRESSION: Dx metallic probe tips overlie the L3 and L4 spinous processes respectively. Spondylolisthesis at L3-4 L4-5 remains stable. Aortic atherosclerosis noted. Electronically Signed   By: Lowella Grip III M.D.   On: 03/06/2016 08:24   Dg Spine Portable 1 View  Result Date: 03/06/2016 CLINICAL DATA:  Laminectomy EXAM: PORTABLE SPINE - 1 VIEW COMPARISON:  February 27, 2016 FINDINGS: Cross-table lateral lumbar image labelled #1 submitted. Metallic probe tips overlie the posterior aspect of the L3 spinous process and the space between the L3 and L4 spinous processes. The more superior probe is directed toward the posterior aspect of the L3 vertebral body. The more inferior probe is directed toward the midportion of the L4 vertebral body. Spondylolisthesis at L3-4 L4-5 is stable. There is aortic atherosclerosis. IMPRESSION: Metallic probe tips overlie the posterior superior aspect of the L3 spinous process and the space between the L3 and L4 spinous processes respectively. There is stable spondylolisthesis at L3-4 L4-5. There is aortic atherosclerosis. Electronically Signed   By: Lowella Grip III  M.D.   On: 03/06/2016 08:02   Foley placed with over 1200 ccs of urine out  I personally performed the services described in this documentation, which was scribed in my presence. The recorded information has been reviewed and is accurate.   Final Clinical Impressions(s) / ED Diagnoses   Final diagnoses:  None    New Prescriptions New Prescriptions   No medications on file  Foley in place.  Leg bag instructions given.  Strict instructions on follow up in 7 days with urology for voiding trial given.  Will start flomax.  Only take one narcotic at a time as alternating can cause problems. Return for fevers weakness blood in the urine vomiting or any concerns.  All questions answered to patient's satisfaction. Based on history and exam patient has been appropriately medically screened and emergency conditions excluded. Patient is stable for discharge at this time. Follow up with your PMD for recheck in 2 days and strict return precautions given.  Veatrice Kells, MD 03/14/16 [id number]    Tiara Bartoli, MD 03/14/16 [id number]

## 2016-03-14 NOTE — ED Notes (Signed)
Pt reports having a small bowel movement 9/6. Last bowel movement before 9/6 on 9/4 that was large and formed.

## 2016-03-14 NOTE — ED Notes (Signed)
Regular urine collection bag removed from pt foley.  Leg bag applied and secured.  RN educated pt on use.

## 2016-03-18 DIAGNOSIS — M519 Unspecified thoracic, thoracolumbar and lumbosacral intervertebral disc disorder: Secondary | ICD-10-CM | POA: Diagnosis not present

## 2016-03-18 DIAGNOSIS — M1990 Unspecified osteoarthritis, unspecified site: Secondary | ICD-10-CM | POA: Diagnosis not present

## 2016-03-18 DIAGNOSIS — Z4789 Encounter for other orthopedic aftercare: Secondary | ICD-10-CM | POA: Diagnosis not present

## 2016-03-18 DIAGNOSIS — E119 Type 2 diabetes mellitus without complications: Secondary | ICD-10-CM | POA: Diagnosis not present

## 2016-03-18 DIAGNOSIS — M4806 Spinal stenosis, lumbar region: Secondary | ICD-10-CM | POA: Diagnosis not present

## 2016-03-18 DIAGNOSIS — F419 Anxiety disorder, unspecified: Secondary | ICD-10-CM | POA: Diagnosis not present

## 2016-03-19 NOTE — Discharge Summary (Signed)
Physician Discharge Summary  Patient ID: [name] MRN: [id number] DOB/AGE: 1930/04/09 82 y.o.  Admit date: 03/06/2016 Discharge date: 03/19/2016  Admission Diagnoses:  Lumbar Spinal Stenosis  Discharge Diagnoses:  Active Problems:   Spinal stenosis, lumbar region, with neurogenic claudication   Past Medical History:  Diagnosis Date  . Arthritis   . Breast cancer ([place])   . Breast cancer of upper-outer quadrant of right female breast ([place]) 07/21/2015  . Colon polyps    adenomatous  . DDD (degenerative disc disease)   . Diabetes mellitus   . Diverticulosis   . Dyslipidemia   . Family history of breast cancer   . Family history of pancreatic cancer   . Family history of prostate cancer   . GERD (gastroesophageal reflux disease)   . Hemorrhoids   . History of blood transfusion   . Peripheral edema    ankles, takes lasix  . PONV (postoperative nausea and vomiting)   . Radiation 09/13/15-10/12/15   42.72 gray to right breast with lumpectomy cavity boost of 12 gray  . Renal insufficiency   . Shingles     Surgeries: Procedure(s): CENTRAL DECOMPRESSION L3 - L4 ,L4 - L5 on 03/06/2016   Consultants (if any):   Discharged Condition: Improved  Hospital Course: MARIVEL MCCLARTY is an 82 y.o. female who was admitted 03/06/2016 with a diagnosis of Lumbar Spinal Stenosis  and went to the operating room on 03/06/2016 and underwent the above named procedures. Day 1 post op pt was on bedrest.  Pt had PT while inpatient starting on day 2 post op. Pt had some hypotension post op day 2. Foley discontinue and pt urinating w/o difficulty while in patient. Home health recommended by PT.  Flatus present.  Pt family is present in hospital room and voice that there will be family present upon DC in the home.   She was given perioperative antibiotics:  Anti-infectives    Start     Dose/Rate Route Frequency Ordered Stop   03/06/16 1600  ceFAZolin (ANCEF) IVPB 1 g/50 mL premix     1 g 100 mL/hr  over 30 Minutes Intravenous Every 8 hours 03/06/16 1038 03/07/16 0724   03/06/16 0822  polymyxin B 500,000 Units, bacitracin 50,000 Units in sodium chloride irrigation 0.9 % 500 mL irrigation  Status:  Discontinued       As needed 03/06/16 0822 03/06/16 0932   03/06/16 0513  ceFAZolin (ANCEF) IVPB 2g/100 mL premix     2 g 200 mL/hr over 30 Minutes Intravenous On call to O.R. 03/06/16 1941 03/06/16 0732    .  She was given sequential compression devices, early ambulation, and TED for DVT prophylaxis.  She benefited maximally from the hospital stay and there were no complications.    Recent vital signs:  Vitals:   03/08/16 2148 03/09/16 0540  BP: (!) 137/54 (!) 110/48  Pulse: 99 93  Resp: 16 16  Temp: 98.2 F (36.8 C) 99.1 F (37.3 C)    Recent laboratory studies:  Lab Results  Component Value Date   HGB 11.2 (L) 03/14/2016   HGB 10.7 (L) 03/14/2016   HGB 9.4 (L) 03/08/2016   Lab Results  Component Value Date   WBC 4.6 03/14/2016   PLT 300 03/14/2016   Lab Results  Component Value Date   INR 1.01 02/27/2016   Lab Results  Component Value Date   NA 135 03/14/2016   K 3.7 03/14/2016   CL 92 (L) 03/14/2016   CO2 30  02/27/2016   BUN 20 03/14/2016   CREATININE 1.30 (H) 03/14/2016   GLUCOSE 209 (H) 03/14/2016    Discharge Medications:     Medication List    STOP taking these medications   aspirin 81 MG chewable tablet Replaced by:  aspirin 325 MG tablet   oxyCODONE-acetaminophen 5-325 MG tablet Commonly known as:  PERCOCET/ROXICET     TAKE these medications   aspirin 325 MG tablet Take 1 tablet (325 mg total) by mouth 2 (two) times daily. To prevent blood clots Replaces:  aspirin 81 MG chewable tablet   atorvastatin 10 MG tablet Commonly known as:  LIPITOR Take 1 tablet (10 mg total) by mouth daily. What changed:  when to take this   B-12 PO Take 1 tablet by mouth daily.   esomeprazole 40 MG capsule Commonly known as:  NEXIUM TAKE 1 CAPSULE DAILY  BEFOREBREAKFAST What changed:  how much to take  how to take this  when to take this  additional instructions   ferrous sulfate 325 (65 FE) MG tablet Take 1 tablet (325 mg total) by mouth 2 (two) times daily with a meal.   furosemide 40 MG tablet Commonly known as:  LASIX TAKE 1 TABLET DAILY FOR    EDEMA What changed:  how much to take  how to take this  when to take this  additional instructions   gabapentin 300 MG capsule Commonly known as:  NEURONTIN TAKE 1 CAPSULE(300 MG) BY MOUTH AT BEDTIME   glipiZIDE 5 MG 24 hr tablet Commonly known as:  GLUCOTROL XL Take 1 tablet (5 mg total) by mouth daily.   glucose blood test strip Commonly known as:  ONETOUCH VERIO Use to test blood sugar twice daily   HYDROcodone-acetaminophen 5-325 MG tablet Commonly known as:  NORCO/VICODIN Take 1-2 tablets by mouth every 4 (four) hours as needed (mild pain).   ONE TOUCH LANCETS Misc Use to test blood sugar twice daily   orphenadrine 100 MG tablet Commonly known as:  NORFLEX Take 1 tablet (100 mg total) by mouth 2 (two) times daily.   OT ULTRA/FASTTK CNTRL SOLN Soln Use as instructed   oxyCODONE 5 MG immediate release tablet Commonly known as:  Oxy IR/ROXICODONE Take 1-2 tablets (5-10 mg total) by mouth every 4 (four) hours as needed for breakthrough pain.       Diagnostic Studies: Dg Chest 2 View  Result Date: 02/27/2016 CLINICAL DATA:  Preoperative evaluation for lumbar spine surgery. EXAM: CHEST  2 VIEW COMPARISON:  Chest radiograph 03/10/2008 FINDINGS: Stable cardiac and mediastinal contours. Calcification of the thoracic aorta. Linear opacity left lung base. Thoracic spine degenerative changes. No pleural effusion or pneumothorax. IMPRESSION: Left basilar scarring and/or atelectasis. No acute cardiopulmonary process. Aortic vascular calcifications. Electronically Signed   By: Lovey Newcomer M.D.   On: 02/27/2016 13:51   Dg Lumbar Spine 2-3 Views  Result Date:  02/27/2016 CLINICAL DATA:  Preop or L3-4, L4-5 laminectomy and fusion EXAM: LUMBAR SPINE - 2-3 VIEW COMPARISON:  CT lumbar spine of 01/23/2016 FINDINGS: Anterolisthesis of L3 on L4 by 5 mm and L4 on L5 by 10 mm is noted. There is degenerative disc disease at L3-4 and L4-5. The remainder of intervertebral disc spaces are unremarkable. Moderate abdominal aortic atherosclerosis is present. Degenerative changes are present in the hips, left-greater-than-right. IMPRESSION: 1. Anterolistheses of L3 on L4 and L4 on L5 with degenerative disc disease at L3-4 and L4-5. 2. Moderate abdominal aortic atherosclerosis. Electronically Signed   By: Ivar Drape  M.D.   On: 02/27/2016 13:54   Dg Spine Portable 1 View  Result Date: 03/06/2016 CLINICAL DATA:  Laminectomy EXAM: PORTABLE SPINE - 1 VIEW COMPARISON:  Study obtained earlier in the day FINDINGS: Cross-table lateral lumbar spine image labeled #3 submitted. The more superiorly placed probe extends inferiorly with the tip posterior to the superior aspect of the L4 vertebral body. The more inferiorly placed probe extends superiorly with the tip posterior to the inferior aspect of the L3 vertebral body. Spondylolisthesis at L3-4 L4-5 remain. There is aortic atherosclerosis. IMPRESSION: Metallic probe tips are posterior to the superior aspect of the L4 vertebral body and posterior to the inferior aspect of the L3 vertebral body respectively. Stable spondylolisthesis at L3-4 L4-5. Aortic atherosclerosis. Electronically Signed   By: Lowella Grip III M.D.   On: 03/06/2016 08:47   Dg Spine Portable 1 View  Result Date: 03/06/2016 CLINICAL DATA:  Laminectomy EXAM: PORTABLE SPINE - 1 VIEW COMPARISON:  Study obtained earlier in the day FINDINGS: Cross-table lateral lumbar image labeled #2 submitted. Metallic probe tips overlie the L3 and L4 spinous processes respectively. There remains spondylolisthesis at L3-4 and L4-5. There is atherosclerotic calcification in the aorta.  IMPRESSION: Dx metallic probe tips overlie the L3 and L4 spinous processes respectively. Spondylolisthesis at L3-4 L4-5 remains stable. Aortic atherosclerosis noted. Electronically Signed   By: Lowella Grip III M.D.   On: 03/06/2016 08:24   Dg Spine Portable 1 View  Result Date: 03/06/2016 CLINICAL DATA:  Laminectomy EXAM: PORTABLE SPINE - 1 VIEW COMPARISON:  February 27, 2016 FINDINGS: Cross-table lateral lumbar image labelled #1 submitted. Metallic probe tips overlie the posterior aspect of the L3 spinous process and the space between the L3 and L4 spinous processes. The more superior probe is directed toward the posterior aspect of the L3 vertebral body. The more inferior probe is directed toward the midportion of the L4 vertebral body. Spondylolisthesis at L3-4 L4-5 is stable. There is aortic atherosclerosis. IMPRESSION: Metallic probe tips overlie the posterior superior aspect of the L3 spinous process and the space between the L3 and L4 spinous processes respectively. There is stable spondylolisthesis at L3-4 L4-5. There is aortic atherosclerosis. Electronically Signed   By: Lowella Grip III M.D.   On: 03/06/2016 08:02    Disposition: 01-Home or Self Care  Pt will present to clinic in 2 weeks Home health was ordered  Post op medications provided   Discharge Instructions    Call MD / Call 911    Complete by:  As directed   If you experience chest pain or shortness of breath, CALL 911 and be transported to the hospital emergency room.  If you develope a fever above 101 F, pus (white drainage) or increased drainage or redness at the wound, or calf pain, call your surgeon's office.   Constipation Prevention    Complete by:  As directed   Drink plenty of fluids.  Prune juice may be helpful.  You may use a stool softener, such as Colace (over the counter) 100 mg twice a day.  Use MiraLax (over the counter) for constipation as needed.   Diet - low sodium heart healthy    Complete by:  As  directed   Discharge instructions    Complete by:  As directed   For the first three days, remove your dressing, tape a piece of saran wrap over your incision.  Take your shower, then remove the saran wrap and put a clean dressing on. After three days  you can shower without the saran wrap.  Call Dr. Gladstone Lighter if any wound complications or temperature of 101 degrees F or over.  Call the office for an appointment to see Dr. Gladstone Lighter in two weeks: [phone] and ask for Dr. Charlestine Night nurse, Brunilda Payor.  Change your dressing daily.  Shower only, no tub bath. Call if any temperatures greater than 101 or any wound complications: [phone] during the day and ask for Dr. Charlestine Night nurse, Brunilda Payor.   Increase activity slowly as tolerated    Complete by:  As directed      Follow-up Information    GIOFFRE,RONALD A, MD. Schedule an appointment as soon as possible for a visit in 2 week(s).   Specialty:  Orthopedic Surgery Contact information: [address] [place] [address] [phone]        [facility] .   Why:  now known [facility]; will call you to arrange Home Health phyysical therapy Contact information: [place] [address] [phone]            Signed: Valinda Hoar 03/19/2016, 12:28 PM

## 2016-03-20 DIAGNOSIS — F419 Anxiety disorder, unspecified: Secondary | ICD-10-CM | POA: Diagnosis not present

## 2016-03-20 DIAGNOSIS — M519 Unspecified thoracic, thoracolumbar and lumbosacral intervertebral disc disorder: Secondary | ICD-10-CM | POA: Diagnosis not present

## 2016-03-20 DIAGNOSIS — M4806 Spinal stenosis, lumbar region: Secondary | ICD-10-CM | POA: Diagnosis not present

## 2016-03-20 DIAGNOSIS — Z4789 Encounter for other orthopedic aftercare: Secondary | ICD-10-CM | POA: Diagnosis not present

## 2016-03-20 DIAGNOSIS — E119 Type 2 diabetes mellitus without complications: Secondary | ICD-10-CM | POA: Diagnosis not present

## 2016-03-20 DIAGNOSIS — M1990 Unspecified osteoarthritis, unspecified site: Secondary | ICD-10-CM | POA: Diagnosis not present

## 2016-03-21 DIAGNOSIS — R338 Other retention of urine: Secondary | ICD-10-CM | POA: Diagnosis not present

## 2016-03-24 ENCOUNTER — Emergency Department (HOSPITAL_COMMUNITY)
Admission: EM | Admit: 2016-03-24 | Discharge: 2016-03-24 | Disposition: A | Discharge status: Home / Self Care | Payer: Medicare Other | Attending: Emergency Medicine | Admitting: Emergency Medicine

## 2016-03-24 ENCOUNTER — Encounter (HOSPITAL_COMMUNITY): Payer: Self-pay

## 2016-03-24 DIAGNOSIS — E039 Hypothyroidism, unspecified: Secondary | ICD-10-CM | POA: Diagnosis not present

## 2016-03-24 DIAGNOSIS — Z7982 Long term (current) use of aspirin: Secondary | ICD-10-CM | POA: Insufficient documentation

## 2016-03-24 DIAGNOSIS — Z79899 Other long term (current) drug therapy: Secondary | ICD-10-CM | POA: Insufficient documentation

## 2016-03-24 DIAGNOSIS — N390 Urinary tract infection, site not specified: Secondary | ICD-10-CM | POA: Diagnosis not present

## 2016-03-24 DIAGNOSIS — Z853 Personal history of malignant neoplasm of breast: Secondary | ICD-10-CM | POA: Diagnosis not present

## 2016-03-24 DIAGNOSIS — R300 Dysuria: Secondary | ICD-10-CM | POA: Diagnosis present

## 2016-03-24 DIAGNOSIS — E119 Type 2 diabetes mellitus without complications: Secondary | ICD-10-CM | POA: Insufficient documentation

## 2016-03-24 LAB — URINALYSIS, ROUTINE W REFLEX MICROSCOPIC
Bilirubin Urine: NEGATIVE
Glucose, UA: NEGATIVE mg/dL
Ketones, ur: NEGATIVE mg/dL
Nitrite: POSITIVE — AB
Protein, ur: NEGATIVE mg/dL
Specific Gravity, Urine: 1.008 (ref 1.005–1.030)
pH: 7 (ref 5.0–8.0)

## 2016-03-24 LAB — URINE MICROSCOPIC-ADD ON

## 2016-03-24 MED ORDER — NITROFURANTOIN MONOHYD MACRO 100 MG PO CAPS: 100.0000 mg | ORAL_CAPSULE | Freq: Two times a day (BID) | ORAL | 0 refills | Status: DC

## 2016-03-24 MED ORDER — OXYBUTYNIN CHLORIDE ER 5 MG PO TB24: 5.0000 mg | ORAL_TABLET | Freq: Every day | ORAL | 0 refills | Status: DC

## 2016-03-24 NOTE — ED Notes (Signed)
Discharge instructions, follow up care, and rx x2 reviewed with patient. Patient verbalized understanding. 

## 2016-03-24 NOTE — ED Provider Notes (Signed)
[facility] DEPT Provider Note   CSN: [id number] Arrival date & time: 03/24/16  0848     History   Chief Complaint Chief Complaint  Patient presents with  . Dysuria    HPI [name] is a 82 y.o. female.  HPI Patient recently had indwelling urinary catheter placed in her removed.  The last few days she's developed painful urination with urgency and frequency.  She denies fever, chills, vomiting.  Denies flank pain.  She has some suprapubic tenderness. Past Medical History:  Diagnosis Date  . Arthritis   . Breast cancer ([place])   . Breast cancer of upper-outer quadrant of right female breast ([place]) 07/21/2015  . Colon polyps    adenomatous  . DDD (degenerative disc disease)   . Diabetes mellitus   . Diverticulosis   . Dyslipidemia   . Family history of breast cancer   . Family history of pancreatic cancer   . Family history of prostate cancer   . GERD (gastroesophageal reflux disease)   . Hemorrhoids   . History of blood transfusion   . Peripheral edema    ankles, takes lasix  . PONV (postoperative nausea and vomiting)   . Radiation 09/13/15-10/12/15   42.72 gray to right breast with lumpectomy cavity boost of 12 gray  . Renal insufficiency   . Shingles     Patient Active Problem List   Diagnosis Date Noted  . Spinal stenosis, lumbar region, with neurogenic claudication 03/06/2016  . Malignant neoplasm of right female breast ([place]) 01/11/2016  . Controlled type 2 diabetes mellitus without complication, without long-term current use of insulin ([place]) 01/11/2016  . Genetic testing 08/18/2015  . Family history of breast cancer   . Family history of prostate cancer   . Family history of pancreatic cancer   . Breast cancer of upper-outer quadrant of right female breast ([place]) 07/21/2015  . Insomnia 05/06/2015  . Medication management 12/16/2013  . Hand arthritis 12/16/2013  . Rectal bleeding 12/16/2013  . Cold intolerance 06/17/2013  . Renal insufficiency  12/23/2012  . Post-menopause 12/23/2012  . Hyperlipidemia 12/23/2012  . Postmenopausal HRT (hormone replacement therapy) 12/23/2012  . History of shingles 12/23/2012  . Wears hearing aid 12/23/2012  . Nonspecific abnormal unspecified cardiovascular function study 02/28/2012  . Chronic sore throat 01/13/2012  . Hoarse 01/13/2012  . Chest pressure 12/03/2011  . Itching  mid back 12/03/2011  . Abnormal EKG 12/03/2011  . Medicare annual wellness visit, subsequent 12/03/2011  . DJD (degenerative joint disease) 12/03/2011  . DIABETES MELLITUS, TYPE II, CONTROLLED 11/15/2009  . VITAMIN D DEFICIENCY 11/15/2009  . EXOGENOUS OBESITY 11/15/2009  . ANXIETY DEPRESSION 12/22/2008  . HYPERTHYROIDISM 08/23/2008  . TOTAL KNEE REPLACEMENT, LEFT, HX OF 08/23/2008  . HYPOTHYROIDISM 10/08/2007  . ANEMIA 10/08/2007  . ARTHRITIS 10/08/2007  . OSTEOPENIA 10/08/2007  . HYPERLIPIDEMIA 11/07/2006  . GERD 11/07/2006    Past Surgical History:  Procedure Laterality Date  . BREAST LUMPECTOMY WITH RADIOACTIVE SEED LOCALIZATION Right 08/03/2015   Procedure: BREAST LUMPECTOMY WITH RADIOACTIVE SEED LOCALIZATION;  Surgeon: [name], MD;  Location: [facility];  Service: General;  Laterality: Right;  . CARPAL TUNNEL RELEASE Right 1977  . CATARACT EXTRACTION W/ INTRAOCULAR LENS  IMPLANT, BILATERAL Bilateral    left 1996; right 1997  . GANGLION CYST EXCISION Right 2003   hand  . JOINT REPLACEMENT    . LUMBAR LAMINECTOMY/DECOMPRESSION MICRODISCECTOMY N/A 03/06/2016   Procedure: CENTRAL DECOMPRESSION L3 - L4 ,L4 - L5;  Surgeon: [name],  MD;  Location: [facility];  Service: Orthopedics;  Laterality: N/A;  . TOTAL KNEE ARTHROPLASTY Right 2003  . TOTAL KNEE ARTHROPLASTY Left 2010  . TOTAL SHOULDER ARTHROPLASTY Right 2010  . [place]  . VAGINAL HYSTERECTOMY  1975    OB History    No data available       Home Medications    Prior to Admission medications     Medication Sig Start Date End Date Taking? Authorizing Provider  acetaminophen (TYLENOL) 500 MG tablet Take 500 mg by mouth every 6 (six) hours as needed for mild pain.   Yes Historical Provider, MD  aspirin EC 81 MG tablet Take 81 mg by mouth daily.   Yes Historical Provider, MD  atorvastatin (LIPITOR) 10 MG tablet Take 1 tablet (10 mg total) by mouth daily. 05/01/15  Yes Burnis Medin, MD  Cyanocobalamin (B-12 PO) Take 1 tablet by mouth daily.   Yes Historical Provider, MD  esomeprazole (NEXIUM) 40 MG capsule TAKE 1 CAPSULE DAILY BEFOREBREAKFAST Patient taking differently: Take 40 mg by mouth every other day.  05/01/15  Yes Burnis Medin, MD  furosemide (LASIX) 40 MG tablet TAKE 1 TABLET DAILY FOR    EDEMA Patient taking differently: Take 20 mg by mouth daily.  05/01/15  Yes Burnis Medin, MD  gabapentin (NEURONTIN) 300 MG capsule TAKE 1 CAPSULE(300 MG) BY MOUTH AT BEDTIME 02/13/16  Yes Burnis Medin, MD  glipiZIDE (GLUCOTROL XL) 5 MG 24 hr tablet Take 1 tablet (5 mg total) by mouth daily. 05/01/15  Yes Burnis Medin, MD  HYDROcodone-acetaminophen (NORCO/VICODIN) 5-325 MG tablet Take 1-2 tablets by mouth every 4 (four) hours as needed (mild pain). 03/08/16  Yes Amber Constable, PA-C  naloxegol oxalate (MOVANTIK) 25 MG TABS tablet Take 25 mg by mouth daily.   Yes Historical Provider, MD  oxyCODONE (OXY IR/ROXICODONE) 5 MG immediate release tablet Take 1-2 tablets (5-10 mg total) by mouth every 4 (four) hours as needed for breakthrough pain. 03/08/16  Yes Amber Cecilio Asper, PA-C  tamsulosin (FLOMAX) 0.4 MG CAPS capsule Take 1 capsule (0.4 mg total) by mouth daily. 03/14/16  Yes April Palumbo, MD  aspirin 325 MG tablet Take 1 tablet (325 mg total) by mouth 2 (two) times daily. To prevent blood clots Patient not taking: Reported on 03/14/2016 03/07/16   Ardeen Jourdain, PA-C  ferrous sulfate 325 (65 FE) MG tablet Take 1 tablet (325 mg total) by mouth 2 (two) times daily with a meal. Patient not taking:  Reported on 03/24/2016 03/08/16   Ardeen Jourdain, PA-C  nitrofurantoin, macrocrystal-monohydrate, (MACROBID) 100 MG capsule Take 1 capsule (100 mg total) by mouth 2 (two) times daily. 03/24/16   Leonard Schwartz, MD  oxybutynin (DITROPAN XL) 5 MG 24 hr tablet Take 1 tablet (5 mg total) by mouth at bedtime. 03/24/16   Leonard Schwartz, MD    Family History Family History  Problem Relation Age of Onset  . Pancreatic cancer Mother 65  . Diabetes Mother   . Prostate cancer Brother   . Diabetes Father   . Heart disease Father   . Diabetes Brother   . Heart attack Brother   . Diabetes Sister   . Diabetes Daughter   . Diabetes Son   . Coronary artery disease Brother     MI in his 81s  . Colon cancer Neg Hx   . Stomach cancer Neg Hx     Social History Social History  Substance Use Topics  .  Smoking status: Never Smoker  . Smokeless tobacco: Never Used  . Alcohol use No     Allergies   Review of patient's allergies indicates no known allergies.   Review of Systems Review of Systems  All other systems reviewed and are negative Physical Exam Updated Vital Signs BP 152/70 (BP Location: Left Arm)   Pulse 91   Temp 97.9 F (36.6 C) (Oral)   Resp 18   SpO2 100%   Physical Exam Physical Exam  Nursing note and vitals reviewed. Constitutional: She is oriented to person, place, and time. She appears well-developed and well-nourished. No distress.  HENT:  Head: Normocephalic and atraumatic.  Eyes: Pupils are equal, round, and reactive to light.  Neck: Normal range of motion.  Cardiovascular: Normal rate and intact distal pulses.   Pulmonary/Chest: No respiratory distress.  Abdominal: Normal appearance. She exhibits no distension.  Musculoskeletal: Normal range of motion.  Neurological: She is alert and oriented to person, place, and time. No cranial nerve deficit.  Skin: Skin is warm and dry. No rash noted.  Psychiatric: She has a normal mood and affect. Her behavior is normal.     ED Treatments / Results  Labs (all labs ordered are listed, but only abnormal results are displayed) Labs Reviewed  URINALYSIS, ROUTINE W REFLEX MICROSCOPIC (NOT AT [facility]) - Abnormal; Notable for the following:       Result Value   APPearance TURBID (*)    Hgb urine dipstick MODERATE (*)    Nitrite POSITIVE (*)    Leukocytes, UA LARGE (*)    All other components within normal limits  URINE MICROSCOPIC-ADD ON - Abnormal; Notable for the following:    Squamous Epithelial / LPF 0-5 (*)    Bacteria, UA MANY (*)    All other components within normal limits  URINE CULTURE    EKG  EKG Interpretation None       Radiology No results found.  Procedures Procedures (including critical care time)  Medications Ordered in ED Medications - No data to display   Initial Impression / Assessment and Plan / ED Course  I have reviewed the triage vital signs and the nursing notes.  Pertinent labs & imaging results that were available during my care of the patient were reviewed by me and considered in my medical decision making (see chart for details).  Clinical Course      Final Clinical Impressions(s) / ED Diagnoses   Final diagnoses:  UTI (lower urinary tract infection)    New Prescriptions New Prescriptions   NITROFURANTOIN, MACROCRYSTAL-MONOHYDRATE, (MACROBID) 100 MG CAPSULE    Take 1 capsule (100 mg total) by mouth 2 (two) times daily.   OXYBUTYNIN (DITROPAN XL) 5 MG 24 HR TABLET    Take 1 tablet (5 mg total) by mouth at bedtime.     Leonard Schwartz, MD 03/24/16 1013

## 2016-03-24 NOTE — ED Triage Notes (Signed)
Pt had urinary cath x 5 days for dysuria.  Pt had it removed Thursday. Pt now with burning and difficulty with urination starting on Friday. Pain is getting worse.

## 2016-03-26 DIAGNOSIS — M519 Unspecified thoracic, thoracolumbar and lumbosacral intervertebral disc disorder: Secondary | ICD-10-CM | POA: Diagnosis not present

## 2016-03-26 DIAGNOSIS — M1990 Unspecified osteoarthritis, unspecified site: Secondary | ICD-10-CM | POA: Diagnosis not present

## 2016-03-26 DIAGNOSIS — M4806 Spinal stenosis, lumbar region: Secondary | ICD-10-CM | POA: Diagnosis not present

## 2016-03-26 DIAGNOSIS — F419 Anxiety disorder, unspecified: Secondary | ICD-10-CM | POA: Diagnosis not present

## 2016-03-26 DIAGNOSIS — E119 Type 2 diabetes mellitus without complications: Secondary | ICD-10-CM | POA: Diagnosis not present

## 2016-03-26 DIAGNOSIS — Z4789 Encounter for other orthopedic aftercare: Secondary | ICD-10-CM | POA: Diagnosis not present

## 2016-03-26 LAB — URINE CULTURE: Culture: >=100000 — AB

## 2016-03-27 ENCOUNTER — Telehealth (HOSPITAL_BASED_OUTPATIENT_CLINIC_OR_DEPARTMENT_OTHER): Payer: Self-pay | Admitting: Emergency Medicine

## 2016-03-27 NOTE — Telephone Encounter (Signed)
Post ED Visit - Positive Culture Follow-up  Culture report reviewed by antimicrobial stewardship pharmacist:  []  Elenor Quinones, Pharm.D. []  Heide Guile, Pharm.D., BCPS []  Parks Neptune, Pharm.D. []  Alycia Rossetti, Pharm.D., BCPS []  [place], Pharm.D., BCPS, AAHIVP []  Legrand Como, Pharm.D., BCPS, AAHIVP []  Milus Glazier, Pharm.D. []  Stephens November, [place].Amedeo Gory PharmD  Positive urine culture Treated with nitrofurantoin, organism sensitive to the same and no further patient follow-up is required at this time.  Hazle Nordmann 03/27/2016, 9:26 AM

## 2016-03-28 DIAGNOSIS — E119 Type 2 diabetes mellitus without complications: Secondary | ICD-10-CM | POA: Diagnosis not present

## 2016-03-28 DIAGNOSIS — Z4789 Encounter for other orthopedic aftercare: Secondary | ICD-10-CM | POA: Diagnosis not present

## 2016-03-28 DIAGNOSIS — M519 Unspecified thoracic, thoracolumbar and lumbosacral intervertebral disc disorder: Secondary | ICD-10-CM | POA: Diagnosis not present

## 2016-03-28 DIAGNOSIS — M4806 Spinal stenosis, lumbar region: Secondary | ICD-10-CM | POA: Diagnosis not present

## 2016-03-28 DIAGNOSIS — R3914 Feeling of incomplete bladder emptying: Secondary | ICD-10-CM | POA: Diagnosis not present

## 2016-03-28 DIAGNOSIS — F419 Anxiety disorder, unspecified: Secondary | ICD-10-CM | POA: Diagnosis not present

## 2016-03-28 DIAGNOSIS — M1990 Unspecified osteoarthritis, unspecified site: Secondary | ICD-10-CM | POA: Diagnosis not present

## 2016-04-01 DIAGNOSIS — Z9889 Other specified postprocedural states: Secondary | ICD-10-CM | POA: Diagnosis not present

## 2016-04-01 DIAGNOSIS — Z4789 Encounter for other orthopedic aftercare: Secondary | ICD-10-CM | POA: Diagnosis not present

## 2016-04-01 DIAGNOSIS — M4807 Spinal stenosis, lumbosacral region: Secondary | ICD-10-CM | POA: Diagnosis not present

## 2016-04-02 ENCOUNTER — Telehealth: Payer: Self-pay | Admitting: Internal Medicine

## 2016-04-02 DIAGNOSIS — R3914 Feeling of incomplete bladder emptying: Secondary | ICD-10-CM | POA: Diagnosis not present

## 2016-04-02 NOTE — Telephone Encounter (Signed)
Pt would like misty to return her call concerning a medication. Pt did not elaborate

## 2016-04-03 NOTE — Telephone Encounter (Signed)
Notified pt. 

## 2016-04-03 NOTE — Telephone Encounter (Signed)
Unless urologist  Has a concern would not change any medication .  Ok to continue

## 2016-04-03 NOTE — Telephone Encounter (Signed)
Pt states that she has a catheter that was placed by her Urologist yesterday.  Patient wants to know if she is supposed to continue lasix while she has the catheter in.

## 2016-04-16 DIAGNOSIS — R3914 Feeling of incomplete bladder emptying: Secondary | ICD-10-CM | POA: Diagnosis not present

## 2016-04-16 DIAGNOSIS — Z4789 Encounter for other orthopedic aftercare: Secondary | ICD-10-CM | POA: Diagnosis not present

## 2016-04-16 DIAGNOSIS — M4807 Spinal stenosis, lumbosacral region: Secondary | ICD-10-CM | POA: Diagnosis not present

## 2016-04-16 DIAGNOSIS — Z9889 Other specified postprocedural states: Secondary | ICD-10-CM | POA: Diagnosis not present

## 2016-04-18 DIAGNOSIS — R3914 Feeling of incomplete bladder emptying: Secondary | ICD-10-CM | POA: Diagnosis not present

## 2016-04-18 DIAGNOSIS — R338 Other retention of urine: Secondary | ICD-10-CM | POA: Diagnosis not present

## 2016-04-23 DIAGNOSIS — R338 Other retention of urine: Secondary | ICD-10-CM | POA: Diagnosis not present

## 2016-04-23 DIAGNOSIS — R3914 Feeling of incomplete bladder emptying: Secondary | ICD-10-CM | POA: Diagnosis not present

## 2016-04-26 ENCOUNTER — Telehealth: Payer: Self-pay | Admitting: Internal Medicine

## 2016-04-26 ENCOUNTER — Encounter: Payer: Self-pay | Admitting: Internal Medicine

## 2016-04-26 ENCOUNTER — Ambulatory Visit (INDEPENDENT_AMBULATORY_CARE_PROVIDER_SITE_OTHER): Payer: Medicare Other | Admitting: Internal Medicine

## 2016-04-26 VITALS — BP 142/66 | HR 105 | Temp 97.6°F | Wt 148.6 lb

## 2016-04-26 DIAGNOSIS — E785 Hyperlipidemia, unspecified: Secondary | ICD-10-CM | POA: Diagnosis not present

## 2016-04-26 DIAGNOSIS — D649 Anemia, unspecified: Secondary | ICD-10-CM

## 2016-04-26 DIAGNOSIS — E119 Type 2 diabetes mellitus without complications: Secondary | ICD-10-CM | POA: Diagnosis not present

## 2016-04-26 DIAGNOSIS — M1990 Unspecified osteoarthritis, unspecified site: Secondary | ICD-10-CM | POA: Diagnosis not present

## 2016-04-26 DIAGNOSIS — N289 Disorder of kidney and ureter, unspecified: Secondary | ICD-10-CM | POA: Diagnosis not present

## 2016-04-26 DIAGNOSIS — Z23 Encounter for immunization: Secondary | ICD-10-CM

## 2016-04-26 DIAGNOSIS — E538 Deficiency of other specified B group vitamins: Secondary | ICD-10-CM

## 2016-04-26 MED ORDER — GLIPIZIDE ER 5 MG PO TB24
5.0000 mg | ORAL_TABLET | Freq: Every day | ORAL | 3 refills | Status: DC
Start: 2016-04-26 — End: 2016-04-26

## 2016-04-26 MED ORDER — ATORVASTATIN CALCIUM 10 MG PO TABS: 10.0000 mg | ORAL_TABLET | Freq: Every day | ORAL | 3 refills | Status: DC

## 2016-04-26 MED ORDER — GLIPIZIDE ER 5 MG PO TB24: 5.0000 mg | ORAL_TABLET | Freq: Every day | ORAL | 3 refills | Status: DC

## 2016-04-26 MED ORDER — FUROSEMIDE 40 MG PO TABS: ORAL_TABLET | ORAL | 3 refills | Status: DC

## 2016-04-26 MED ORDER — ONETOUCH ULTRASOFT LANCETS MISC: 3 refills | Status: DC

## 2016-04-26 MED ORDER — ONETOUCH VERIO VI STRP: ORAL_STRIP | 3 refills | Status: DC

## 2016-04-26 NOTE — Progress Notes (Signed)
Chief Complaint  Patient presents with  . Follow-up    HPI: [name] 82 y.o.   comesin with husband for Chronic disease management  She had surgery on her back and the August still has residual back pain but had GU complications see below. DM:  No new symptoms no low blood sugar no change vision. Breast cancer  Go back   Final  november .    Bladder issues  Has cath    Back surgery  Ok  Still some pain  .   ROS: See pertinent positives and negatives per HPI. No chest pain shortness of breath or falling.  Past Medical History:  Diagnosis Date  . Arthritis   . Breast cancer ([place])   . Breast cancer of upper-outer quadrant of right female breast ([place]) 07/21/2015  . Colon polyps    adenomatous  . DDD (degenerative disc disease)   . Diabetes mellitus   . Diverticulosis   . Dyslipidemia   . Family history of breast cancer   . Family history of pancreatic cancer   . Family history of prostate cancer   . GERD (gastroesophageal reflux disease)   . Hemorrhoids   . History of blood transfusion   . Peripheral edema    ankles, takes lasix  . PONV (postoperative nausea and vomiting)   . Radiation 09/13/15-10/12/15   42.72 gray to right breast with lumpectomy cavity boost of 12 gray  . Renal insufficiency   . Shingles     Family History  Problem Relation Age of Onset  . Pancreatic cancer Mother 56  . Diabetes Mother   . Prostate cancer Brother   . Diabetes Father   . Heart disease Father   . Diabetes Brother   . Heart attack Brother   . Diabetes Sister   . Diabetes Daughter   . Diabetes Son   . Coronary artery disease Brother     MI in his 20s  . Colon cancer Neg Hx   . Stomach cancer Neg Hx     Social History   Social History  . Marital status: Married    Spouse name: N/A  . Number of children: 2  . Years of education: N/A   Occupational History  . Retired    Social History Main Topics  . Smoking status: Never Smoker  . Smokeless tobacco: Never Used  .  Alcohol use No  . Drug use: No  . Sexual activity: No   Other Topics Concern  . None   Social History Narrative   HH 2 married   Works for [employer] for 40 years retired   No pets     Outpatient Medications Prior to Visit  Medication Sig Dispense Refill  . acetaminophen (TYLENOL) 500 MG tablet Take 500 mg by mouth every 6 (six) hours as needed for mild pain.    Marland Kitchen aspirin EC 81 MG tablet Take 81 mg by mouth daily.    . Cyanocobalamin (B-12 PO) Take 1 tablet by mouth daily.    Marland Kitchen gabapentin (NEURONTIN) 300 MG capsule TAKE 1 CAPSULE(300 MG) BY MOUTH AT BEDTIME 30 capsule 3  . furosemide (LASIX) 40 MG tablet TAKE 1 TABLET DAILY FOR    EDEMA (Patient taking differently: Take 20 mg by mouth daily. ) 90 tablet 3  . glipiZIDE (GLUCOTROL XL) 5 MG 24 hr tablet Take 1 tablet (5 mg total) by mouth daily. 90 tablet 3  . aspirin 325 MG tablet Take 1 tablet (325 mg total)  by mouth 2 (two) times daily. To prevent blood clots (Patient not taking: Reported on 03/14/2016) 30 tablet 0  . atorvastatin (LIPITOR) 10 MG tablet Take 1 tablet (10 mg total) by mouth daily. 90 tablet 3  . esomeprazole (NEXIUM) 40 MG capsule TAKE 1 CAPSULE DAILY BEFOREBREAKFAST (Patient taking differently: Take 40 mg by mouth every other day. ) 90 capsule 3  . ferrous sulfate 325 (65 FE) MG tablet Take 1 tablet (325 mg total) by mouth 2 (two) times daily with a meal. (Patient not taking: Reported on 03/24/2016) 30 tablet 0  . HYDROcodone-acetaminophen (NORCO/VICODIN) 5-325 MG tablet Take 1-2 tablets by mouth every 4 (four) hours as needed (mild pain). 60 tablet 0  . naloxegol oxalate (MOVANTIK) 25 MG TABS tablet Take 25 mg by mouth daily.    . nitrofurantoin, macrocrystal-monohydrate, (MACROBID) 100 MG capsule Take 1 capsule (100 mg total) by mouth 2 (two) times daily. 10 capsule 0  . oxybutynin (DITROPAN XL) 5 MG 24 hr tablet Take 1 tablet (5 mg total) by mouth at bedtime. 5 tablet 0  . oxyCODONE (OXY IR/ROXICODONE) 5 MG immediate  release tablet Take 1-2 tablets (5-10 mg total) by mouth every 4 (four) hours as needed for breakthrough pain. 40 tablet 0  . tamsulosin (FLOMAX) 0.4 MG CAPS capsule Take 1 capsule (0.4 mg total) by mouth daily. 30 capsule 0   No facility-administered medications prior to visit.      EXAM:  BP (!) 142/66 (BP Location: Right Arm, Patient Position: Sitting, Cuff Size: Normal)   Pulse (!) 105   Temp 97.6 F (36.4 C) (Oral)   Wt 148 lb 9.6 oz (67.4 kg)   SpO2 96%   BMI 28.08 kg/m   Body mass index is 28.08 kg/m.  GENERAL: vitals reviewed and listed above, alert, oriented, appears well hydrated and in no acute distress HEENT: atraumatic, conjunctiva  clear, no obvious abnormalities on inspection of external nose and ears OP : no lesion edema or exudate  NECK: no obvious masses on inspection palpation  LUNGS: clear to auscultation bilaterally, no wheezes, rales or rhonchi, good air movement CV: HRRR, no clubbing cyanosis or  peripheral edema nl cap refill  MS: moves all extremities without noticeable focal  abnormality PSYCH: pleasant and cooperative, no obvious depression or anxiety Lab Results  Component Value Date   WBC 4.6 03/14/2016   HGB 11.2 (L) 03/14/2016   HCT 33.0 (L) 03/14/2016   PLT 300 03/14/2016   GLUCOSE 209 (H) 03/14/2016   CHOL 170 05/01/2015   TRIG 242.0 (H) 05/01/2015   HDL 36.20 (L) 05/01/2015   LDLDIRECT 85.0 05/01/2015   LDLCALC 83 12/16/2013   ALT 15 02/27/2016   AST 18 02/27/2016   NA 135 03/14/2016   K 3.7 03/14/2016   CL 92 (L) 03/14/2016   CREATININE 1.30 (H) 03/14/2016   BUN 20 03/14/2016   CO2 30 02/27/2016   TSH 2.06 05/01/2015   INR 1.01 02/27/2016   HGBA1C 7.0 (H) 02/27/2016   MICROALBUR <0.2 12/27/2014   Lab values reviewed ASSESSMENT AND PLAN:  Discussed the following assessment and plan:  Type 2 diabetes mellitus without complication, without long-term current use of insulin (HCC) - Reasonable control A1c 7 - Plan: Lipid panel,  Hemoglobin A1c, CBC with Differential/Platelet, Basic metabolic panel, IBC panel, Ferritin  Renal insufficiency - Plan: Lipid panel, Hemoglobin A1c, CBC with Differential/Platelet, Basic metabolic panel, IBC panel, Ferritin, Vitamin B12  Hyperlipidemia, unspecified hyperlipidemia type - Plan: Lipid panel, Basic metabolic panel,  IBC panel, Ferritin  Osteoarthritis, unspecified osteoarthritis type, unspecified site  Encounter for immunization - Plan: Flu vaccine HIGH DOSE PF, atorvastatin (LIPITOR) 10 MG tablet, furosemide (LASIX) 40 MG tablet, glipiZIDE (GLUCOTROL XL) 5 MG 24 hr tablet, ONETOUCH VERIO test strip, Lancets (ONETOUCH ULTRASOFT) lancets  Mild anemia - Postop we'll follow-up next labs. - Plan: CBC with Differential/Platelet, Basic metabolic panel, IBC panel, Ferritin, Vitamin B12  Deficiency of vitamin B12  Rule out  based on anemia  - Plan: Vitamin B12 Had elective  Surgery  8 30  And then secondary urinary retention and  uti in September to ed   -Patient advised to return or notify health care team  if symptoms worsen ,persist or new concerns arise. Mild anemia can follow-up at next lab work.  Plan wellness visit with susan and   Ov with me the same day for med management exam  In 4 months   Lab s pre visit   Lipid  Bmp a1c cbcdiff  ibc ferritin and b12  Patient Instructions   You diabetes is  In reasonable control .  Will do med refills as planned  Can do  Next years wellness visit with susan out health coach and  Med management the same day with me for exam and labs  Labs prev siti  I will place  Lipid bmp a1c and cbcdiff and iron studies         Lab Results  Component Value Date   WBC 4.6 03/14/2016   HGB 11.2 (L) 03/14/2016   HCT 33.0 (L) 03/14/2016   PLT 300 03/14/2016   GLUCOSE 209 (H) 03/14/2016   CHOL 170 05/01/2015   TRIG 242.0 (H) 05/01/2015   HDL 36.20 (L) 05/01/2015   LDLDIRECT 85.0 05/01/2015   LDLCALC 83 12/16/2013   ALT 15 02/27/2016   AST 18  02/27/2016   NA 135 03/14/2016   K 3.7 03/14/2016   CL 92 (L) 03/14/2016   CREATININE 1.30 (H) 03/14/2016   BUN 20 03/14/2016   CO2 30 02/27/2016   TSH 2.06 05/01/2015   INR 1.01 02/27/2016   HGBA1C 7.0 (H) 02/27/2016   MICROALBUR <0.2 12/27/2014       Mariann Laster K. Panosh M.D.

## 2016-04-26 NOTE — Progress Notes (Signed)
Pre visit review using our clinic review tool, if applicable. No additional management support is needed unless otherwise documented below in the visit note. 

## 2016-04-26 NOTE — Telephone Encounter (Signed)
Pt needs her medications to go to [facility] order not local pharm that it was sent to

## 2016-04-26 NOTE — Telephone Encounter (Signed)
Called and spoke with pt informing her that medications have been sent to [facility]. Pt verbalized understanding.

## 2016-04-26 NOTE — Patient Instructions (Addendum)
You diabetes is  In reasonable control .  Will do med refills as planned  Can do  Next years wellness visit with susan out health coach and  Med management the same day with me for exam and labs  Labs prev siti  I will place  Lipid bmp a1c and cbcdiff and iron studies         Lab Results  Component Value Date   WBC 4.6 03/14/2016   HGB 11.2 (L) 03/14/2016   HCT 33.0 (L) 03/14/2016   PLT 300 03/14/2016   GLUCOSE 209 (H) 03/14/2016   CHOL 170 05/01/2015   TRIG 242.0 (H) 05/01/2015   HDL 36.20 (L) 05/01/2015   LDLDIRECT 85.0 05/01/2015   LDLCALC 83 12/16/2013   ALT 15 02/27/2016   AST 18 02/27/2016   NA 135 03/14/2016   K 3.7 03/14/2016   CL 92 (L) 03/14/2016   CREATININE 1.30 (H) 03/14/2016   BUN 20 03/14/2016   CO2 30 02/27/2016   TSH 2.06 05/01/2015   INR 1.01 02/27/2016   HGBA1C 7.0 (H) 02/27/2016   MICROALBUR <0.2 12/27/2014

## 2016-04-30 DIAGNOSIS — N312 Flaccid neuropathic bladder, not elsewhere classified: Secondary | ICD-10-CM | POA: Diagnosis not present

## 2016-04-30 DIAGNOSIS — R3914 Feeling of incomplete bladder emptying: Secondary | ICD-10-CM | POA: Diagnosis not present

## 2016-05-03 ENCOUNTER — Other Ambulatory Visit: Payer: Self-pay | Admitting: Urology

## 2016-05-10 DIAGNOSIS — R338 Other retention of urine: Secondary | ICD-10-CM | POA: Diagnosis not present

## 2016-05-13 ENCOUNTER — Ambulatory Visit: Payer: Medicare Other | Admitting: Internal Medicine

## 2016-05-13 NOTE — H&P (Signed)
HPI: [name] is a 82 year-old female with problems emptying the bladder.  She first noticed the symptoms 03/14/2016.   She developed urinary retention after lumbar laminectomy. She had been taking Flomax. She is failed multiple voiding trials. She was placed on oxybutynin in the emergency room but that was stopped.  She was having pain and taking a lot of pain medication and this resulted in constipation. Constipation has now resolved and she is not taking any more pain medication. She wears a catheter today.   Prior to developing urinary retention she said she did not have difficulty with recurrent UTIs having had only a couple over the years and sed as far as her urinary pattern goes she did have some mild hesitancy but had no difficulty emptying her bladder.   Interval history:She currently has a Foley catheter indwelling.     ALLERGIES: None   MEDICATIONS: Aspirin 81 mg tablet, chewable  Atorvastatin Calcium 10 mg tablet  Esomeprazole Sodium 40 mg vial  Ferrous Sulfate  Furosemide 40 mg tablet  Gabapentin 300 mg capsule  Glipizide Xl 5 mg tablet, extended release 24 hr  Tylenol Extra Strength  Vitamin B12     GU PSH: Catheterize For Residual - 03/28/2016 Complex cystometrogram, w/ void pressure and urethral pressure profile studies, any technique - 04/23/2016 Complex Uroflow - 04/23/2016, 03/21/2016 Emg surf Electrd - 04/23/2016 Inject For cystogram - 04/23/2016 Intrabd voidng Press - 04/23/2016    NON-GU PSH: Breast Surgery Procedure, Right, lumpectomy - 03/21/2016 Cataract Surgery, Bilateral Lumbar Laminectomy - 03/06/2016 Shoulder Arthroscopy/surgery, Right - 2010    GU PMH: Incomplete bladder emptying (Stable), She has urinary retention and we discussed the options which would be Foley catheterization versus CIC. I then we will obtain urodynamics since all of her risk factors have been resolved. - 04/16/2016, (Worsening, Chronic), Has now tried and failed multiple  voiding trails. Will replace foley and proceed with referral to Dr. Matilde Sprang. , - 04/02/2016 (Stable, Chronic), D/C Oxybutynin Continue daily Tamsulosin Continue with timed/double voiding F/U in 3 days for PVR. If PVR remains elevated will need UDS and f/u w/Dr. Matilde Sprang, - 03/28/2016 Urinary Retention (Acute) - 03/21/2016    NON-GU PMH: Diabetes Type 2 GERD Hypercholesterolemia Malignant neoplasm of unspecified site of unspecified female breast    FAMILY HISTORY: Diabetes - Mother, Father, Daughter, Sister, Son, Brother   SOCIAL HISTORY: Marital Status: Married Current Smoking Status: Patient has never smoked.  Has never drank.  Does not drink caffeine. Patient's occupation is/was Retired.    REVIEW OF SYSTEMS:    GU Review Female:   Patient denies frequent urination, hard to postpone urination, burning /pain with urination, get up at night to urinate, leakage of urine, stream starts and stops, trouble starting your stream, have to strain to urinate, and currently pregnant.  Gastrointestinal (Upper):   Patient denies nausea, vomiting, and indigestion/ heartburn.  Gastrointestinal (Lower):   Patient denies diarrhea and constipation.  Constitutional:   Patient reports fatigue. Patient denies fever, night sweats, and weight loss.  Skin:   Patient denies skin rash/ lesion and itching.  Eyes:   Patient denies blurred vision and double vision.  Ears/ Nose/ Throat:   Patient denies sore throat and sinus problems.  Hematologic/Lymphatic:   Patient denies swollen glands and easy bruising.  Cardiovascular:   Patient denies leg swelling and chest pains.  Respiratory:   Patient denies cough and shortness of breath.  Endocrine:   Patient denies excessive thirst.  Musculoskeletal:   Patient reports  back pain. Patient denies joint pain.  Neurological:   Patient denies headaches and dizziness.  Psychologic:   Patient denies depression and anxiety.   VITAL SIGNS:      04/30/2016 01:11 PM  BP  125/64 mmHg  Pulse 87 /min   Physical Exam  Constitutional: Well nourished and well developed . No acute distress.    ENT:. The ears and nose are normal in appearance.    Neck: The appearance of the neck is normal and no neck mass is present.    Pulmonary: No respiratory distress and normal respiratory rhythm and effort.    Cardiovascular: Heart rate and rhythm are normal . No peripheral edema.    Abdomen: The abdomen is soft and nontender. No masses are palpated. No CVA tenderness. No hernias are palpable. No hepatosplenomegaly noted.    Lymphatics: The femoral and inguinal nodes are not enlarged or tender.    Skin: Normal skin turgor, no visible rash and no visible skin lesions.    Neuro/Psych:. Mood and affect are appropriate.    Urodynamics 04/23/16: Study was performed to evaluate urinary retention.  CMG: She was found to have a maximum cystometric capacity of 450 cc with a slight elevation of her end filling pressure of 9 cm H2O. She had normal sensation with no detrusor instability noted.  Leak point pressure: She generated pressure up to 93 cm H2O without leakage.  Pressure-flow: She was able to generate a voluntary contraction and voided 65 cc with a maximum flow rate of 6 cc/second with a detrusor pressure at maximum flow of only 14 cm H2O. Her PVR was 385 cc. Her contraction was poorly sustained. Her flow was interrupted.  EMG: There was some increase in EMG activity during attempts to void.  Fluoroscopy: There was slight descent of the bladder with Valsalva, mild trabeculation and a small left sided diverticulum.   Impression: She has normal capacity with normal sensation but a week poorly sustained detrusor contraction. It would appear CIC is going to be her best option initially.      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              Notes:   Based on her urodynamics results I discussed with her the options. These would include continued urethral Foley catheterization with intermittent voiding trials, the placement of a suprapubic tube which would allow her to perform voiding trials at home and could be removed when she began to void spontaneously and clean, intermittent catheterization which she tried once that was unable to perform.   She'll be scheduled for suprapubic tube placement    PLAN: Suprapubic tube placement.

## 2016-05-14 ENCOUNTER — Encounter (HOSPITAL_BASED_OUTPATIENT_CLINIC_OR_DEPARTMENT_OTHER): Payer: Self-pay | Admitting: *Deleted

## 2016-05-14 ENCOUNTER — Ambulatory Visit: Payer: Medicare Other | Admitting: Internal Medicine

## 2016-05-14 NOTE — Progress Notes (Signed)
NPO AFTER MN.  ARRIVE AT 0745.  NEEDS ISTAT 8.  CURRENT EKG IN CHART AND EPIC.  WILL TAKE ZANTAC AM DOS W/ SIPS OF WATER.

## 2016-05-17 ENCOUNTER — Ambulatory Visit (HOSPITAL_BASED_OUTPATIENT_CLINIC_OR_DEPARTMENT_OTHER): Payer: Medicare Other | Admitting: Anesthesiology

## 2016-05-17 ENCOUNTER — Ambulatory Visit (HOSPITAL_BASED_OUTPATIENT_CLINIC_OR_DEPARTMENT_OTHER)
Admission: RE | Admit: 2016-05-17 | Discharge: 2016-05-17 | Disposition: A | Discharge status: Home / Self Care | Payer: Medicare Other | Source: Ambulatory Visit | Attending: Urology | Admitting: Urology

## 2016-05-17 ENCOUNTER — Encounter (HOSPITAL_BASED_OUTPATIENT_CLINIC_OR_DEPARTMENT_OTHER): Payer: Self-pay

## 2016-05-17 ENCOUNTER — Encounter (HOSPITAL_BASED_OUTPATIENT_CLINIC_OR_DEPARTMENT_OTHER)
Admission: RE | Disposition: A | Discharge status: Home / Self Care | Payer: Self-pay | Source: Ambulatory Visit | Attending: Urology

## 2016-05-17 DIAGNOSIS — Z79899 Other long term (current) drug therapy: Secondary | ICD-10-CM | POA: Diagnosis not present

## 2016-05-17 DIAGNOSIS — E7800 Pure hypercholesterolemia, unspecified: Secondary | ICD-10-CM | POA: Diagnosis not present

## 2016-05-17 DIAGNOSIS — R339 Retention of urine, unspecified: Secondary | ICD-10-CM | POA: Diagnosis not present

## 2016-05-17 DIAGNOSIS — N183 Chronic kidney disease, stage 3 (moderate): Secondary | ICD-10-CM | POA: Diagnosis not present

## 2016-05-17 DIAGNOSIS — Z7982 Long term (current) use of aspirin: Secondary | ICD-10-CM | POA: Insufficient documentation

## 2016-05-17 DIAGNOSIS — E119 Type 2 diabetes mellitus without complications: Secondary | ICD-10-CM | POA: Insufficient documentation

## 2016-05-17 DIAGNOSIS — K219 Gastro-esophageal reflux disease without esophagitis: Secondary | ICD-10-CM | POA: Insufficient documentation

## 2016-05-17 DIAGNOSIS — Z7984 Long term (current) use of oral hypoglycemic drugs: Secondary | ICD-10-CM | POA: Diagnosis not present

## 2016-05-17 DIAGNOSIS — Z466 Encounter for fitting and adjustment of urinary device: Secondary | ICD-10-CM | POA: Diagnosis not present

## 2016-05-17 DIAGNOSIS — R338 Other retention of urine: Secondary | ICD-10-CM | POA: Diagnosis not present

## 2016-05-17 DIAGNOSIS — N312 Flaccid neuropathic bladder, not elsewhere classified: Secondary | ICD-10-CM | POA: Diagnosis not present

## 2016-05-17 HISTORY — DX: Localized edema: R60.0

## 2016-05-17 HISTORY — DX: Low back pain, unspecified: M54.50

## 2016-05-17 HISTORY — DX: Retention of urine, unspecified: R33.9

## 2016-05-17 HISTORY — DX: Presence of spectacles and contact lenses: Z97.3

## 2016-05-17 HISTORY — PX: CYSTOSTOMY: SHX155

## 2016-05-17 HISTORY — DX: Anemia, unspecified: D64.9

## 2016-05-17 HISTORY — DX: Personal history of urinary (tract) infections: Z87.440

## 2016-05-17 HISTORY — DX: Presence of urogenital implants: Z96.0

## 2016-05-17 HISTORY — DX: Personal history of irradiation: Z92.3

## 2016-05-17 HISTORY — DX: Low back pain: M54.5

## 2016-05-17 HISTORY — DX: Other chronic pain: G89.29

## 2016-05-17 HISTORY — DX: Personal history of colon polyps, unspecified: Z86.0100

## 2016-05-17 HISTORY — DX: Chronic kidney disease, stage 3 (moderate): N18.3

## 2016-05-17 HISTORY — DX: Presence of external hearing-aid: Z97.4

## 2016-05-17 HISTORY — DX: Flaccid neuropathic bladder, not elsewhere classified: N31.2

## 2016-05-17 HISTORY — DX: Presence of dental prosthetic device (complete) (partial): Z97.2

## 2016-05-17 HISTORY — DX: Type 2 diabetes mellitus without complications: E11.9

## 2016-05-17 HISTORY — DX: Bifascicular block: I45.2

## 2016-05-17 HISTORY — DX: Diaphragmatic hernia without obstruction or gangrene: K44.9

## 2016-05-17 HISTORY — DX: Presence of other specified devices: Z97.8

## 2016-05-17 HISTORY — DX: Hyperlipidemia, unspecified: E78.5

## 2016-05-17 HISTORY — DX: Chronic kidney disease, stage 3 unspecified: N18.30

## 2016-05-17 HISTORY — DX: Personal history of colonic polyps: Z86.010

## 2016-05-17 LAB — POCT I-STAT, CHEM 8
BUN: 26 mg/dL — ABNORMAL HIGH (ref 6–20)
Calcium, Ion: 1.24 mmol/L (ref 1.15–1.40)
Chloride: 104 mmol/L (ref 101–111)
Creatinine, Ser: 1.5 mg/dL — ABNORMAL HIGH (ref 0.44–1.00)
Glucose, Bld: 148 mg/dL — ABNORMAL HIGH (ref 65–99)
HCT: 35 % — ABNORMAL LOW (ref 36.0–46.0)
Hemoglobin: 11.9 g/dL — ABNORMAL LOW (ref 12.0–15.0)
Potassium: 3.9 mmol/L (ref 3.5–5.1)
Sodium: 142 mmol/L (ref 135–145)
TCO2: 27 mmol/L (ref 0–100)

## 2016-05-17 LAB — GLUCOSE, CAPILLARY: Glucose-Capillary: 140 mg/dL — ABNORMAL HIGH (ref 65–99)

## 2016-05-17 SURGERY — CREATION, CYSTOSTOMY, SUPRAPUBIC
Anesthesia: Monitor Anesthesia Care | Site: Bladder

## 2016-05-17 MED ORDER — FENTANYL CITRATE (PF) 100 MCG/2ML IJ SOLN
25.0000 ug | INTRAMUSCULAR | Status: DC | PRN
  Filled 2016-05-17: qty 1

## 2016-05-17 MED ORDER — PROMETHAZINE HCL 25 MG/ML IJ SOLN
6.2500 mg | INTRAMUSCULAR | Status: DC | PRN
  Filled 2016-05-17: qty 1

## 2016-05-17 MED ORDER — FENTANYL CITRATE (PF) 100 MCG/2ML IJ SOLN
INTRAMUSCULAR | Status: AC
  Filled 2016-05-17: qty 2

## 2016-05-17 MED ORDER — SODIUM CHLORIDE 0.9 % IV SOLN
INTRAVENOUS | Status: DC
  Administered 2016-05-17 (×2): via INTRAVENOUS
  Filled 2016-05-17: qty 1000

## 2016-05-17 MED ORDER — CIPROFLOXACIN HCL 500 MG PO TABS: 500.0000 mg | ORAL_TABLET | Freq: Two times a day (BID) | ORAL | 0 refills | Status: DC

## 2016-05-17 MED ORDER — PROPOFOL 500 MG/50ML IV EMUL
INTRAVENOUS | Status: AC
  Filled 2016-05-17: qty 50

## 2016-05-17 MED ORDER — CEFAZOLIN SODIUM-DEXTROSE 2-4 GM/100ML-% IV SOLN
2.0000 g | INTRAVENOUS | Status: AC
  Administered 2016-05-17: 2 g via INTRAVENOUS
  Filled 2016-05-17: qty 100

## 2016-05-17 MED ORDER — PROPOFOL 500 MG/50ML IV EMUL
INTRAVENOUS | Status: DC | PRN
  Administered 2016-05-17: 50 ug/kg/min via INTRAVENOUS

## 2016-05-17 MED ORDER — CEFAZOLIN SODIUM-DEXTROSE 2-4 GM/100ML-% IV SOLN
INTRAVENOUS | Status: AC
  Filled 2016-05-17: qty 100

## 2016-05-17 MED ORDER — HYDROCODONE-ACETAMINOPHEN 7.5-325 MG PO TABS: 1.0000 | ORAL_TABLET | ORAL | 0 refills | Status: DC | PRN

## 2016-05-17 MED ORDER — FENTANYL CITRATE (PF) 100 MCG/2ML IJ SOLN
INTRAMUSCULAR | Status: DC | PRN
  Administered 2016-05-17 (×2): 25 ug via INTRAVENOUS

## 2016-05-17 MED ORDER — LIDOCAINE 2% (20 MG/ML) 5 ML SYRINGE
INTRAMUSCULAR | Status: AC
  Filled 2016-05-17: qty 5

## 2016-05-17 MED ORDER — STERILE WATER FOR IRRIGATION IR SOLN
Status: DC | PRN
  Administered 2016-05-17: 3000 mL via INTRAVESICAL

## 2016-05-17 MED ORDER — LIDOCAINE HCL (PF) 1 % IJ SOLN
INTRAMUSCULAR | Status: DC | PRN
  Administered 2016-05-17: 10 mL

## 2016-05-17 SURGICAL SUPPLY — 21 items
BAG DRAIN URO-CYSTO SKYTR STRL (DRAIN) ×2 IMPLANT
BAG DRN UROCATH (DRAIN) ×1
BAG URINE DRAINAGE (UROLOGICAL SUPPLIES) ×2 IMPLANT
CATH FOLEY 2WAY SLVR  5CC 16FR (CATHETERS) ×1
CATH FOLEY 2WAY SLVR 5CC 16FR (CATHETERS) ×1 IMPLANT
CATH FOLEY INTRO SUPRA 16F (CATHETERS) ×2 IMPLANT
CLOTH BEACON ORANGE TIMEOUT ST (SAFETY) ×2 IMPLANT
ELECT REM PT RETURN 9FT ADLT (ELECTROSURGICAL) ×2
ELECTRODE REM PT RTRN 9FT ADLT (ELECTROSURGICAL) ×1 IMPLANT
GLOVE BIO SURGEON STRL SZ8 (GLOVE) ×2 IMPLANT
GOWN STRL REUS W/ TWL XL LVL3 (GOWN DISPOSABLE) ×1 IMPLANT
GOWN STRL REUS W/TWL XL LVL3 (GOWN DISPOSABLE) ×2
KIT ROOM TURNOVER WOR (KITS) ×2 IMPLANT
NEEDLE HYPO 25X1 1.5 SAFETY (NEEDLE) ×2 IMPLANT
PACK CYSTO (CUSTOM PROCEDURE TRAY) ×2 IMPLANT
PENCIL BUTTON HOLSTER BLD 10FT (ELECTRODE) ×2 IMPLANT
SPONGE GAUZE 4X4 12PLY STER LF (GAUZE/BANDAGES/DRESSINGS) ×2 IMPLANT
SUT SILK 0 TIES 10X30 (SUTURE) ×2 IMPLANT
SUT SILK 2 0 FS (SUTURE) ×2 IMPLANT
TAPE PAPER 2X10 WHT MICROPORE (GAUZE/BANDAGES/DRESSINGS) ×2 IMPLANT
WATER STERILE IRR 3000ML UROMA (IV SOLUTION) ×2 IMPLANT

## 2016-05-17 NOTE — Anesthesia Procedure Notes (Signed)
Procedure Name: MAC Date/Time: 05/17/2016 10:11 AM Performed by: Bethena Roys T Pre-anesthesia Checklist: Patient identified, Emergency Drugs available, Suction available, Patient being monitored and Timeout performed Oxygen Delivery Method: Nasal cannula Placement Confirmation: positive ETCO2

## 2016-05-17 NOTE — Discharge Instructions (Signed)
Suprapubic Catheter Home Guide °A suprapubic catheter is a rubber tube with a tiny balloon on the end. It is used to drain urine from the bladder. This catheter is put in your bladder through a small opening in the lower center part of your abdomen. Suprapubic refers to the area right above your pubic bone. °The balloon on the end of the catheter is filled with germ-free (sterile) water. This keeps the catheter from slipping out. When the catheter is in place, your urine will drain into a collection bag. The bag can be put beside your bed at night or attached to your leg during the day. °HOW TO CARE FOR YOUR CATHETER  °Cleaning your skin °· Clean the skin around the catheter opening every day. °¨ Wash your hands with soap and water. °¨ Clean the skin around the opening with a clean washcloth and soapy water. Do not pull on the tube. °¨ Pat the area dry with a clean towel. °· Your caregiver may want you to put a bandage (dressing) over the site. Do not use ointment on this area unless your caregiver tells you to. °Cleaning the catheter °· Ask your caregiver if you need to clean the catheter and how often. °· Use only soap and water. °· There may be crusts on the catheter. Put hydrogen peroxide on a cotton ball or gauze pad to remove any crust.  °Emptying the collection bag °· You may have a large drainage bag to use at night and a smaller one for daytime. Empty the large bag every 8 hours. Empty the small bag when it is about  full. °· Keep the drainage bag below the level of the catheter. This keeps urine from flowing backwards. °· Hold the bag over the toilet or another container. Release the valve (spigot) at the bottom of the bag. Do not touch the opening of the spigot. Do not let the opening touch the toilet or container. °· Close the spigot tightly when the bag is empty. °Cleaning the collection bag °· Clean the bag every few days. °¨ First, wash your hands. °¨ Disconnect the tubing from the catheter. Replace  the used bag with a new bag. Then you can clean the used one. °¨ Empty the used bag completely. Rinse it out with warm water and soap or fill the bag with water and add 1 teaspoon of vinegar. Let it sit for about 30 minutes. Then drain. °· The bag should be completely dry before storing it. Put it inside a plastic bag to keep it clean. °Checking everything °· Always make sure there are no kinks in the catheter or tubing. °· Always make sure there are no leaks in the catheter, tubing, or collection bag. °HOW TO CHANGE YOUR CATHETER °Sometimes, a caregiver will change your suprapubic catheter. Other times, you may need to change it yourself. This may be the case if you need to wear a catheter for a long time. Usually, they need to be changed every 4 to 6 weeks. Ask your caregiver how often yours should be changed. °Your caregiver will help you order the following supplies for home delivery: °· Sterile gloves. °· Catheters. °· Syringes. °· Sterile water. °· Sterile cleaning solution. °· Lubricant. °· Drainage bags. °Changing your catheter °· Drink plenty of fluids before changing the catheter. °· Wash your hands with soap and water. °· Lie on your back and put on sterile gloves. °· Clean the skin around the catheter opening. Use the sterile cleaning solution. °· Use   a syringe to get the water out of the balloon from the old catheter.  Slowly remove the catheter.  Take off the first pair of gloves, and put on a new pair. Then put lubricant on the tip of the new catheter. Put the new catheter through the opening.  Wait for some urine to start flowing. Then, use the other syringe to fill the balloon with sterile water.  Attach the catheter to your drainage bag. Make sure the connection is tight. Important warnings  The catheter should come out easily. If it seems stuck, do not pull it.  Call your caregiver right away if you have any trouble while changing the catheter.  When the old catheter is removed, the  new one should be put in right away. This is because the opening will close quickly. If you have a problem, go to an emergency clinic right away. RISKS AND COMPLICATIONS  Urine flow can become blocked. This can happen if the catheter or tubes are not working right. A blood clot can also block urine flow.  The catheter might irritate tissue in your body. This can cause bleeding.  The skin near the opening for the catheter may become irritated or infected.  Bacteria may get into your bladder. This can cause a urinary tract infection. HOME CARE INSTRUCTIONS  Take all medicines prescribed by your caregiver. Follow the directions carefully.  Drink 8 glasses of water every day. This produces good urine flow.  Check the skin around your catheter a few times every day. Watch for redness and swelling. Look for any fluids coming out of the opening.  Do not use powder or cream around the catheter opening.  Do not take tub baths or use pools or hot tubs.  Keep all follow-up appointments. SEEK MEDICAL CARE IF:  You leak urine.  Your skin around the catheter becomes red or sore.  Your urine flow slows down.  Your urine gets cloudy or smelly. SEEK IMMEDIATE MEDICAL CARE IF:   You have chills, nausea, or back pain.  You have trouble changing your catheter.  Your catheter comes out.  You have blood in your urine.  You have no urine flow for 1 hour.  You have a fever.   This information is not intended to replace advice given to you by your health care provider. Make sure you discuss any questions you have with your health care provider.    Post Anesthesia Home Care Instructions  Activity: Get plenty of rest for the remainder of the day. A responsible adult should stay with you for 24 hours following the procedure.  For the next 24 hours, DO NOT: -Drive a car -Paediatric nurse -Drink alcoholic beverages -Take any medication unless instructed by your physician -Make any legal  decisions or sign important papers.  Meals: Start with liquid foods such as gelatin or soup. Progress to regular foods as tolerated. Avoid greasy, spicy, heavy foods. If nausea and/or vomiting occur, drink only clear liquids until the nausea and/or vomiting subsides. Call your physician if vomiting continues.  Special Instructions/Symptoms: Your throat may feel dry or sore from the anesthesia or the breathing tube placed in your throat during surgery. If this causes discomfort, gargle with warm salt water. The discomfort should disappear within 24 hours.  If you had a scopolamine patch placed behind your ear for the management of post- operative nausea and/or vomiting:  1. The medication in the patch is effective for 72 hours, after which it should be removed.  Wrap patch in a tissue and discard in the trash. Wash hands thoroughly with soap and water. 2. You may remove the patch earlier than 72 hours if you experience unpleasant side effects which may include dry mouth, dizziness or visual disturbances. 3. Avoid touching the patch. Wash your hands with soap and water after contact with the patch.

## 2016-05-17 NOTE — Op Note (Signed)
PATIENT:  [name]  PRE-OPERATIVE DIAGNOSIS: Chronic urinary retention  POST-OPERATIVE DIAGNOSIS: Same  PROCEDURE: Cystoscopy with suprapubic tube placement  SURGEON:  Claybon Jabs  INDICATION: [name] is a 82 year old female with chronic urinary retention. We discussed the options for management and she has elected to proceed with suprapubic tube placement in order to allow her to attempt to void with the understanding that the tube can be removed once she is emptying her bladder adequately. Preop urine culture was negative.  ANESTHESIA:  MAC  EBL:  Minimal  DRAINS: 12 French Foley catheter as a suprapubic tube.  LOCAL MEDICATIONS USED:  Lidocaine  SPECIMEN:  None  Description of procedure: After informed consent the patient was taken to the operating room and placed on the table in a supine position. General anesthesia was then administered. Once fully anesthetized the patient was moved to the dorsal lithotomy position and the genitalia and lower abdomen were sterilely prepped and draped in standard fashion. An official timeout was then performed.  The 23 French rigid cystoscope with 30 lens was passed into the bladder and the bladder was fully and systematically inspected. She was noted to have 3+ trabeculation with some pseudo-diverticuli and primarily at the dome. There was some irritation from her previous Foley catheter on the posterior wall the bladder but no worrisome lesions were identified. Ureteral orifices were of normal configuration and position.  Her bladder was filled to capacity and she was placed in Trendelenburg position. A location approximately 2 fingerbreadths above the symphysis pubis was identified and infiltrated with lidocaine. A midline incision approximately 1 cm in length was then made and the trocar suprapubic tube introducer with sheath was then placed through the skin incision and with direct visualization of the dome of the bladder with  the cystoscope I noted the the trocar indenting the dome and therefore advanced the trocar into the bladder. The trocar was removed and the SP tube sheath was left in place. A 16 French Foley catheter was passed through the sheath into the bladder and then the sheath was withdrawn and removed from the catheter. The catheter was filled with 10 mL of sterile water and the patient was moved out of Trendelenburg position. The tube was noted to be in good position with no bleeding. I therefore drained the bladder and removed the cystoscope.  2-0 silk suture was used to close the suprapubic tube incision in a figure-of-eight fashion and then this was secured to the suprapubic tube. The tube was connected to closed system drainage and the tube site was dressed with sterile gauze. The patient was then awakened and taken to the recovery room in stable and satisfactory condition. She tolerated the procedure well with no intraoperative complications.  PLAN OF CARE: Discharge to home after PACU  PATIENT DISPOSITION:  PACU - hemodynamically stable.

## 2016-05-17 NOTE — Transfer of Care (Signed)
Immediate Anesthesia Transfer of Care Note  Patient: [name]  Procedure(s) Performed: Procedure(s): CYSTOSCOPY WITH  SUPRAPUBIC PLACMENT (N/A)  Patient Location: PACU  Anesthesia Type:MAC  Level of Consciousness: awake, alert  and oriented  Airway & Oxygen Therapy: Patient Spontanous Breathingo2 per R[place]y  Post-op Assessment: Report given to RN and Post -op Vital signs reviewed and stable  Post vital signs: Reviewed and stable  Last Vitals: 142/62, 91, 18, 100% Vitals:   05/17/16 0803  BP: (!) 119/57  Pulse: 99  Resp: 20  Temp: 37 C    Last Pain:  Vitals:   05/17/16 0803  TempSrc: Oral      Patients Stated Pain Goal: 5 (61/16/43 5391)  Complications: No apparent anesthesia complications

## 2016-05-17 NOTE — Anesthesia Preprocedure Evaluation (Signed)
Anesthesia Evaluation  Patient identified by MRN, date of birth, ID band Patient awake    Reviewed: Allergy & Precautions, NPO status , Patient's Chart, lab work & pertinent test results  Airway Mallampati: II  TM Distance: >3 FB Neck ROM: Full    Dental no notable dental hx. (+) Upper Dentures, Partial Lower   Pulmonary neg pulmonary ROS,    Pulmonary exam normal breath sounds clear to auscultation       Cardiovascular negative cardio ROS Normal cardiovascular exam Rhythm:Regular Rate:Normal     Neuro/Psych negative neurological ROS  negative psych ROS   GI/Hepatic Neg liver ROS, GERD  ,  Endo/Other  diabetesHypothyroidism Hyperthyroidism   Renal/GU Renal InsufficiencyRenal disease  negative genitourinary   Musculoskeletal negative musculoskeletal ROS (+)   Abdominal   Peds negative pediatric ROS (+)  Hematology negative hematology ROS (+)   Anesthesia Other Findings   Reproductive/Obstetrics negative OB ROS                             Anesthesia Physical Anesthesia Plan  ASA: III  Anesthesia Plan: MAC   Post-op Pain Management:    Induction: Intravenous  Airway Management Planned: Simple Face Mask  Additional Equipment:   Intra-op Plan:   Post-operative Plan:   Informed Consent: I have reviewed the patients History and Physical, chart, labs and discussed the procedure including the risks, benefits and alternatives for the proposed anesthesia with the patient or authorized representative who has indicated his/her understanding and acceptance.   Dental advisory given  Plan Discussed with: CRNA and Surgeon  Anesthesia Plan Comments:         Anesthesia Quick Evaluation

## 2016-05-17 NOTE — Anesthesia Postprocedure Evaluation (Signed)
Anesthesia Post Note  Patient: [name]  Procedure(s) Performed: Procedure(s) (LRB): CYSTOSCOPY WITH  SUPRAPUBIC PLACMENT (N/A)  Patient location during evaluation: PACU Anesthesia Type: MAC Level of consciousness: awake and alert Pain management: pain level controlled Vital Signs Assessment: post-procedure vital signs reviewed and stable Respiratory status: spontaneous breathing, nonlabored ventilation, respiratory function stable and patient connected to nasal cannula oxygen Cardiovascular status: stable and blood pressure returned to baseline Anesthetic complications: no    Last Vitals:  Vitals:   05/17/16 1040 05/17/16 1045  BP: (!) 142/62 125/61  Pulse: 91 89  Resp: 16 12  Temp: (!) 35.9 C     Last Pain:  Vitals:   05/17/16 0803  TempSrc: Oral                 Talina Pleitez S

## 2016-05-20 ENCOUNTER — Encounter (HOSPITAL_BASED_OUTPATIENT_CLINIC_OR_DEPARTMENT_OTHER): Payer: Self-pay | Admitting: Urology

## 2016-05-22 ENCOUNTER — Telehealth: Payer: Self-pay

## 2016-05-22 NOTE — Telephone Encounter (Signed)
DECLINES AWV  SHAY 05-22-16

## 2016-06-06 ENCOUNTER — Ambulatory Visit
Admission: RE | Admit: 2016-06-06 | Discharge: 2016-06-06 | Disposition: A | Discharge status: Home / Self Care | Payer: Medicare Other | Source: Ambulatory Visit | Attending: Radiation Oncology | Admitting: Radiation Oncology

## 2016-06-06 ENCOUNTER — Encounter: Payer: Self-pay | Admitting: Radiation Oncology

## 2016-06-06 VITALS — BP 129/69 | HR 87 | Temp 97.8°F | Resp 18 | Ht 61.0 in | Wt 149.0 lb

## 2016-06-06 DIAGNOSIS — C50411 Malignant neoplasm of upper-outer quadrant of right female breast: Secondary | ICD-10-CM | POA: Diagnosis not present

## 2016-06-06 DIAGNOSIS — Y842 Radiological procedure and radiotherapy as the cause of abnormal reaction of the patient, or of later complication, without mention of misadventure at the time of the procedure: Secondary | ICD-10-CM | POA: Insufficient documentation

## 2016-06-06 DIAGNOSIS — Z171 Estrogen receptor negative status [ER-]: Secondary | ICD-10-CM | POA: Diagnosis not present

## 2016-06-06 DIAGNOSIS — Z08 Encounter for follow-up examination after completed treatment for malignant neoplasm: Secondary | ICD-10-CM | POA: Diagnosis not present

## 2016-06-06 NOTE — Progress Notes (Signed)
[name] here for follow up.  She denies having pain and fatigue.  The skin on her right breast has mild hyperpigmentation. Wt Readings from Last 3 Encounters:  06/06/16 149 lb (67.6 kg)  05/17/16 147 lb (66.7 kg)  04/26/16 148 lb 9.6 oz (67.4 kg)  BP 129/69   Pulse 87   Temp 97.8 F (36.6 C) (Oral)   Resp 18   Ht 5\' 1"  (1.549 m)   Wt 149 lb (67.6 kg)   SpO2 100%   BMI 28.15 kg/m

## 2016-06-06 NOTE — Progress Notes (Signed)
Radiation Oncology         [phone] ________________________________  Name: [name] MRN: [id number]  Date: 06/06/2016  DOB: 03/13/1930  Follow-Up Visit Note  CC: Lottie Dawson, MD  [name], MD    ICD-9-CM ICD-10-CM   1. 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  Indication for treatment:  Breast conservation       Radiation treatment dates:   09/13/2015-10/12/2015  Site/dose:   42.72 gray in 16 fractions directed right breast with a boost of 12 gray in 6 fractions directed at the lumpectomy cavity  Interval History:  Jill Poling here for follow up. She denies having pain and fatigue. The skin on her right breast has hyperpigmentation.   ALLERGIES:  has No Known Allergies.  Meds: Current Outpatient Prescriptions  Medication Sig Dispense Refill  . aspirin EC 81 MG tablet Take 81 mg by mouth daily.    Marland Kitchen atorvastatin (LIPITOR) 10 MG tablet Take 1 tablet (10 mg total) by mouth daily. (Patient taking differently: Take 10 mg by mouth every morning. ) 90 tablet 3  . Calcium Citrate-Vitamin D (CALCIUM CITRATE +D PO) Take 630 mg by mouth 2 (two) times daily. Vit d3 is 500iu    . Cholecalciferol (VITAMIN D3) 2000 units TABS Take by mouth every morning.    . Cyanocobalamin (B-12 PO) Take 1 tablet by mouth daily.    . furosemide (LASIX) 40 MG tablet TAKE 1 TABLET DAILY FOR    EDEMA (Patient taking differently: Take 40 mg by mouth every morning. TAKE 1 TABLET DAILY FOR    EDEMA) 90 tablet 3  . gabapentin (NEURONTIN) 300 MG capsule TAKE 1 CAPSULE(300 MG) BY MOUTH AT BEDTIME 30 capsule 3  . glipiZIDE (GLUCOTROL XL) 5 MG 24 hr tablet Take 1 tablet (5 mg total) by mouth daily. (Patient taking differently: Take 5 mg by mouth daily  with breakfast. ) 90 tablet 3  . Potassium 99 MG TABS Take 1 tablet by mouth every morning.    . ranitidine (ZANTAC) 150 MG tablet Take 150 mg by mouth 2 (two) times daily as needed for heartburn.    Marland Kitchen acetaminophen (TYLENOL) 500 MG tablet Take 500 mg by mouth every 6 (six) hours as needed for mild pain.    . calcium carbonate (TUMS - DOSED IN MG ELEMENTAL CALCIUM) 500 MG chewable tablet Chew 1 tablet by mouth as needed for indigestion or heartburn.    Marland Kitchen HYDROcodone-acetaminophen (NORCO) 7.5-325 MG tablet Take 1-2 tablets by mouth every 4 (four) hours as needed for moderate pain. Maximum dose per 24 hours - 8 pills (Patient not taking: Reported on 06/06/2016) 16 tablet 0   No current facility-administered medications for this encounter.     Physical Findings: The patient is in no acute distress. Patient is alert and oriented.  height is _0  (1.549 m) and weight is 149 lb (67.6 kg). Her oral temperature is 97.8 F (36.6 C). Her blood pressure is 129/69 and her pulse is 87. Her respiration is 18 and oxygen saturation is 100%. .  No significant changes. No palpable cervical, supraclavicular or axillary lymphoadenopathy. The heart has a regular rate and rhythm. The lungs are clear to auscultation. Left breast has no mass or nipple discharge. Right breast the patient continues to  have some edema in the nipple areolar complex area. No dominant mass appreciated in the breast, nipple discharge or bleeding. Mild tenderness along the lumpectomy site.   Lab Findings: Lab Results  Component Value Date   WBC 4.6 03/14/2016   HGB 11.9 (L) 05/17/2016   HCT 35.0 (L) 05/17/2016   MCV 89.2 03/14/2016   PLT 300 03/14/2016    Radiographic Findings: No results found.  Impression:  The patient is recovering from the effects of radiation.  No evidence of disease recurrence on clinical exam.   Plan: Patient will proceed with her yearly mammograms next month. Follow up in radiation oncology in 6 months.  Patient continues to follow up with Dr. Donne Hazel. She is not being seen in medical oncology.  -----------------------------------  Blair Promise, PhD, MD  This document serves as a record of services personally performed by Gery Pray, MD. It was created on his behalf by Bethann Humble, a trained medical scribe. The creation of this record is based on the scribe's personal observations and the provider's statements to them. This document has been checked and approved by the attending provider.

## 2016-06-14 DIAGNOSIS — N312 Flaccid neuropathic bladder, not elsewhere classified: Secondary | ICD-10-CM | POA: Diagnosis not present

## 2016-06-17 ENCOUNTER — Encounter: Payer: Self-pay | Admitting: Family Medicine

## 2016-06-17 DIAGNOSIS — H40013 Open angle with borderline findings, low risk, bilateral: Secondary | ICD-10-CM | POA: Diagnosis not present

## 2016-06-17 LAB — HM DIABETES EYE EXAM

## 2016-06-24 ENCOUNTER — Other Ambulatory Visit: Payer: Self-pay | Admitting: Internal Medicine

## 2016-06-25 NOTE — Telephone Encounter (Signed)
Sent to the pharmacy by e-scribe for 3 months.  Pt has upcoming cpx on 08/27/16.  Wellness with Manuela Schwartz same day.

## 2016-06-28 ENCOUNTER — Ambulatory Visit (INDEPENDENT_AMBULATORY_CARE_PROVIDER_SITE_OTHER): Payer: Medicare Other | Admitting: Physician Assistant

## 2016-06-28 ENCOUNTER — Ambulatory Visit (INDEPENDENT_AMBULATORY_CARE_PROVIDER_SITE_OTHER): Payer: Medicare Other

## 2016-06-28 VITALS — BP 122/70 | HR 90 | Temp 98.0°F | Resp 18 | Ht 61.0 in | Wt 149.0 lb

## 2016-06-28 DIAGNOSIS — R079 Chest pain, unspecified: Secondary | ICD-10-CM | POA: Diagnosis not present

## 2016-06-28 DIAGNOSIS — R0781 Pleurodynia: Secondary | ICD-10-CM

## 2016-06-28 NOTE — Progress Notes (Signed)
06/28/2016 10:13 AM   DOB: Jul 03, 1930 / MRN: [id number]  SUBJECTIVE:  [name] is a 82 y.o. female presenting for left sided rib pain that started after a fall outside that occurred on week ago.  She fell to the left and was able to get up on her own.  States the pain started about 2 days after the fall.  Denies cough, SOB, leg swelling, radicular pattern.  Pain made worse with bending over.  Has been taking tylenol with mild relief.  She has No Known Allergies.   She  has a past medical history of Anemia; Arthritis; Bilateral edema of lower extremity; Breast cancer of upper-outer quadrant of right female breast [facility]) (dx 07/19/2015--- oncologist-  dr Lindi Adie dr Sondra Come); Chronic lower back pain; CKD (chronic kidney disease) stage 3, GFR 30-59 ml/min; DDD (degenerative disc disease); Diverticulosis; Family history of breast cancer; Family history of pancreatic cancer; Family history of prostate cancer; Flaccid neuropathic bladder, not elsewhere classified; Foley catheter in place; GERD (gastroesophageal reflux disease); Hemorrhoids; Hiatal hernia; History of colon polyps; History of radiation therapy (09-12-2015 to 10-12-2015); History of recurrent UTIs; Hyperlipidemia; PONV (postoperative nausea and vomiting); RBBB (right bundle branch block with left anterior fascicular block); Type 2 diabetes mellitus ([place]); Urinary retention with incomplete bladder emptying; Wears dentures; Wears glasses; and Wears hearing aid.    She  reports that she has never smoked. She has never used smokeless tobacco. She reports that she does not drink alcohol or use drugs. She  reports that she does not engage in sexual activity. The patient  has a past surgical history that includes Total knee arthroplasty (Bilateral, right 06-23-2002/  left 07-11-2008); Umbilical hernia repair (1958); Carpal tunnel release (Right, 1977); Cataract extraction w/ intraocular lens  implant, bilateral (Bilateral, left 1996/  right 1997);  Ganglion cyst excision (Right, 12/09/2001); Breast lumpectomy with radioactive seed localization (Right, 08/03/2015); Lumbar laminectomy/decompression microdiscectomy (N/A, 03/06/2016); CLOSED RIGHT KNEE MANIPULATION (08/11/2002); Knee arthroscopy (Right, 12/14/2002); Shoulder hemi-arthroplasty (Right, 02/20/2009); Vaginal hysterectomy (1975); Cardiovascular stress test (01/14/2012); and Cystostomy (N/A, 05/17/2016).  Her family history includes Coronary artery disease in her brother; Diabetes in her brother, daughter, father, mother, sister, and son; Heart attack in her brother; Heart disease in her father; Pancreatic cancer (age of onset: 15) in her mother; Prostate cancer in her brother.  Review of Systems  Constitutional: Negative for fever.  Respiratory: Negative for cough.   Cardiovascular: Negative for chest pain and leg swelling.  Musculoskeletal: Positive for falls, joint pain and myalgias.  Skin: Negative for itching and rash.  Neurological: Negative for dizziness.    The problem list and medications were reviewed and updated by myself where necessary and exist elsewhere in the encounter.   OBJECTIVE:  BP 122/70 (BP Location: Right Arm, Patient Position: Sitting, Cuff Size: Small)   Pulse 90   Temp 98 F (36.7 C) (Oral)   Resp 18   Ht 5\' 1"  (1.549 m)   Wt 149 lb (67.6 kg)   SpO2 97%   BMI 28.15 kg/m   Physical Exam  Constitutional: She is oriented to person, place, and time.  Cardiovascular: Normal rate, regular rhythm and normal heart sounds.   Pulmonary/Chest: Effort normal and breath sounds normal. She exhibits no crepitus.  Musculoskeletal: She exhibits tenderness.       Arms: Neurological: She is alert and oriented to person, place, and time.    No results found for this or any previous visit (from the past 72 hour(s)).  Dg Chest  2 View  Result Date: 06/28/2016 CLINICAL DATA:  Left-sided chest pain.  Fall 7 days prior EXAM: CHEST  2 VIEW COMPARISON:  February 27, 2016 FINDINGS: There is scarring in the left base. Lungs elsewhere are clear. Heart size and pulmonary vascularity are normal. No adenopathy. There is atherosclerotic calcification in the aorta. There is a total shoulder replacement on the right. There is no evident pneumothorax. There is no appreciable fracture. There are surgical clips in right axilla. IMPRESSION: No edema or consolidation. Scarring left base. Aortic atherosclerosis. Status post total shoulder replacement on the right. Electronically Signed   By: Lowella Grip III M.D.   On: 06/28/2016 10:02   Lab Results  Component Value Date   CREATININE 1.50 (H) 05/17/2016     ASSESSMENT AND PLAN  [name] was seen today for fall.  Diagnoses and all orders for this visit:  Rib tenderness: Rad negative for fracture and nothing acute.  Advised she continue tylenol. RTC as needed.  -     DG Chest 2 View; Future    The patient is advised to call or return to clinic if she does not see an improvement in symptoms, or to seek the care of the closest emergency department if she worsens with the above plan.   Philis Fendt, MHS, PA-C [facility] 06/28/2016 10:13 AM

## 2016-06-28 NOTE — Patient Instructions (Signed)
     IF you received an x-ray today, you will receive an invoice from [facility]. Please contact [facility] at [phone] with questions or concerns regarding your invoice.   IF you received labwork today, you will receive an invoice from [facility]. Please contact [facility] at [phone] with questions or concerns regarding your invoice.   Our billing staff will not be able to assist you with questions regarding bills from these companies.  You will be contacted with the lab results as soon as they are available. The fastest way to get your results is to activate your My Chart account. Instructions are located on the last page of this paperwork. If you have not heard from us regarding the results in 2 weeks, please contact this office.     

## 2016-07-09 ENCOUNTER — Encounter: Payer: Self-pay | Admitting: Internal Medicine

## 2016-07-17 DIAGNOSIS — N312 Flaccid neuropathic bladder, not elsewhere classified: Secondary | ICD-10-CM | POA: Diagnosis not present

## 2016-08-19 ENCOUNTER — Telehealth: Payer: Self-pay

## 2016-08-19 NOTE — Telephone Encounter (Signed)
Call for AWV Is scheduled for lab visit tomorrow. Called to see if she would like to stay to complete her wellness visit?

## 2016-08-20 ENCOUNTER — Other Ambulatory Visit (INDEPENDENT_AMBULATORY_CARE_PROVIDER_SITE_OTHER): Payer: Medicare Other

## 2016-08-20 DIAGNOSIS — E785 Hyperlipidemia, unspecified: Secondary | ICD-10-CM

## 2016-08-20 DIAGNOSIS — N289 Disorder of kidney and ureter, unspecified: Secondary | ICD-10-CM | POA: Diagnosis not present

## 2016-08-20 DIAGNOSIS — E119 Type 2 diabetes mellitus without complications: Secondary | ICD-10-CM

## 2016-08-20 DIAGNOSIS — R7989 Other specified abnormal findings of blood chemistry: Secondary | ICD-10-CM | POA: Diagnosis not present

## 2016-08-20 DIAGNOSIS — E538 Deficiency of other specified B group vitamins: Secondary | ICD-10-CM | POA: Diagnosis not present

## 2016-08-20 DIAGNOSIS — D649 Anemia, unspecified: Secondary | ICD-10-CM | POA: Diagnosis not present

## 2016-08-20 LAB — LIPID PANEL
Cholesterol: 160 mg/dL (ref 0–200)
HDL: 41.2 mg/dL (ref >39.00)
NonHDL: 118.84
Total CHOL/HDL Ratio: 4
Triglycerides: 214 mg/dL — ABNORMAL HIGH (ref 0.0–149.0)
VLDL: 42.8 mg/dL — ABNORMAL HIGH (ref 0.0–40.0)

## 2016-08-20 LAB — BASIC METABOLIC PANEL
BUN: 21 mg/dL (ref 6–23)
CO2: 30 mEq/L (ref 19–32)
Calcium: 9.4 mg/dL (ref 8.4–10.5)
Chloride: 109 mEq/L (ref 96–112)
Creatinine, Ser: 1.12 mg/dL (ref 0.40–1.20)
GFR: 49.2 mL/min — ABNORMAL LOW (ref >60.00)
Glucose, Bld: 148 mg/dL — ABNORMAL HIGH (ref 70–99)
Potassium: 4.2 mEq/L (ref 3.5–5.1)
Sodium: 142 mEq/L (ref 135–145)

## 2016-08-20 LAB — CBC WITH DIFFERENTIAL/PLATELET
Basophils Absolute: 0 10*3/uL (ref 0.0–0.1)
Basophils Relative: 1 % (ref 0.0–3.0)
Eosinophils Absolute: 0.1 10*3/uL (ref 0.0–0.7)
Eosinophils Relative: 3.4 % (ref 0.0–5.0)
HCT: 36.3 % (ref 36.0–46.0)
Hemoglobin: 12 g/dL (ref 12.0–15.0)
Lymphocytes Relative: 25.1 % (ref 12.0–46.0)
Lymphs Abs: 1 10*3/uL (ref 0.7–4.0)
MCHC: 33.1 g/dL (ref 30.0–36.0)
MCV: 90.6 fl (ref 78.0–100.0)
Monocytes Absolute: 0.4 10*3/uL (ref 0.1–1.0)
Monocytes Relative: 9.9 % (ref 3.0–12.0)
Neutro Abs: 2.5 10*3/uL (ref 1.4–7.7)
Neutrophils Relative %: 60.6 % (ref 43.0–77.0)
Platelets: 198 10*3/uL (ref 150.0–400.0)
RBC: 4.01 Mil/uL (ref 3.87–5.11)
RDW: 14.8 % (ref 11.5–15.5)
WBC: 4.2 10*3/uL (ref 4.0–10.5)

## 2016-08-20 LAB — VITAMIN B12: Vitamin B-12: >1500 pg/mL — ABNORMAL HIGH (ref 211–911)

## 2016-08-20 LAB — LDL CHOLESTEROL, DIRECT: Direct LDL: 75 mg/dL

## 2016-08-20 LAB — IBC PANEL
Iron: 66 ug/dL (ref 42–145)
Saturation Ratios: 20.3 % (ref 20.0–50.0)
Transferrin: 232 mg/dL (ref 212.0–360.0)

## 2016-08-20 LAB — HEMOGLOBIN A1C: Hgb A1c MFr Bld: 6.7 % — ABNORMAL HIGH (ref 4.6–6.5)

## 2016-08-20 LAB — FERRITIN: Ferritin: 40.6 ng/mL (ref 10.0–291.0)

## 2016-08-21 DIAGNOSIS — N312 Flaccid neuropathic bladder, not elsewhere classified: Secondary | ICD-10-CM | POA: Diagnosis not present

## 2016-08-25 NOTE — Progress Notes (Signed)
Chief Complaint  Patient presents with  . Follow-up    HPI: [name] 83 y.o. come in for Chronic disease management   Has dm and recent dx of breast cancer under rx    Missed appt with SUsan for AMW    DM no change bgs 130  When gets up at night takes mustard for cramps and cake to be sure no hypoglycemia but no documenter hypoglycemia     No vision neuropathy sx .   Cv no sx   MS right back and knee pain problematic  Hx of knee and back surgery .   Vision check utd.   Breast cancer has fu soon surgeon and radiation team.   Had  Opening in bladder  Suprapubic tube . Had hx of retention and not taking pain meds  ROS: See pertinent positives and negatives per HPI.  Past Medical History:  Diagnosis Date  . Anemia   . Arthritis   . Bilateral edema of lower extremity   . 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 Chronic lower back pain   . CKD (chronic kidney disease) stage 3, GFR 30-59 ml/min   . DDD (degenerative disc disease)    lumbar  . Diverticulosis   . Family history of breast cancer   . Family history of pancreatic cancer   . Family history of prostate cancer   . Flaccid neuropathic bladder, not elsewhere classified   . Foley catheter in place   . GERD (gastroesophageal reflux disease)   . Hemorrhoids   . Hiatal hernia   . History of colon polyps   . History of radiation therapy 09-12-2015 to 10-12-2015   42.72 gray in 16 fractions directed right breast w/ boost of 12 gray in 6 fractions directed at the lumpectomy cavity- Total dose: 54.72y  . History of recurrent UTIs   . Hyperlipidemia   . PONV (postoperative nausea and vomiting)   . RBBB (right bundle branch block with left anterior fascicular block)   . Type 2 diabetes mellitus ([place])   .  Urinary retention with incomplete bladder emptying   . Wears dentures    full upper and lower partial  . Wears glasses   . Wears hearing aid    bilateral but does not wear    Family History  Problem Relation Age of Onset  . Pancreatic cancer Mother 66  . Diabetes Mother   . Prostate cancer Brother   . Diabetes Father   . Heart disease Father   . Diabetes Brother   . Heart attack Brother   . Diabetes Sister   . Diabetes Daughter   . Diabetes Son   . Coronary artery disease Brother     MI in his 43s  . Colon cancer Neg Hx   . Stomach cancer Neg Hx     Social History   Social History  . Marital status: Married    Spouse name: N/A  . Number of children: 2  . Years of education: N/A   Occupational History  . Retired    Social History Main Topics  . Smoking status: Never Smoker  . Smokeless tobacco: Never Used  . Alcohol use No  . Drug use: No  . Sexual activity: No   Other Topics Concern  .  None   Social History Narrative   HH 2 married   Works for [employer] for 40 years retired   No pets     Outpatient Medications Prior to Visit  Medication Sig Dispense Refill  . acetaminophen (TYLENOL) 500 MG tablet Take 500 mg by mouth every 6 (six) hours as needed for mild pain.    Marland Kitchen aspirin EC 81 MG tablet Take 81 mg by mouth daily.    Marland Kitchen atorvastatin (LIPITOR) 10 MG tablet Take 1 tablet (10 mg total) by mouth daily. (Patient taking differently: Take 10 mg by mouth every morning. ) 90 tablet 3  . calcium carbonate (TUMS - DOSED IN MG ELEMENTAL CALCIUM) 500 MG chewable tablet Chew 1 tablet by mouth as needed for indigestion or heartburn.    . Calcium Citrate-Vitamin D (CALCIUM CITRATE +D PO) Take 630 mg by mouth 2 (two) times daily. Vit d3 is 500iu    . Cholecalciferol (VITAMIN D3) 2000 units TABS Take by mouth every morning.    . Cyanocobalamin (B-12 PO) Take 1 tablet by mouth daily.    . furosemide (LASIX) 40 MG tablet TAKE 1 TABLET DAILY FOR    EDEMA (Patient taking  differently: Take 40 mg by mouth every morning. TAKE 1 TABLET DAILY FOR    EDEMA) 90 tablet 3  . gabapentin (NEURONTIN) 300 MG capsule TAKE 1 CAPSULE(300 MG) BY MOUTH AT BEDTIME 30 capsule 2  . glipiZIDE (GLUCOTROL XL) 5 MG 24 hr tablet Take 1 tablet (5 mg total) by mouth daily. (Patient taking differently: Take 5 mg by mouth daily with breakfast. ) 90 tablet 3  . Potassium 99 MG TABS Take 1 tablet by mouth every morning.    Marland Kitchen HYDROcodone-acetaminophen (NORCO) 7.5-325 MG tablet Take 1-2 tablets by mouth every 4 (four) hours as needed for moderate pain. Maximum dose per 24 hours - 8 pills (Patient not taking: Reported on 06/28/2016) 16 tablet 0  . ranitidine (ZANTAC) 150 MG tablet Take 150 mg by mouth 2 (two) times daily as needed for heartburn.     No facility-administered medications prior to visit.      EXAM:  BP 130/64 (BP Location: Right Arm)   Temp 98 F (36.7 C) (Oral)   Ht _0  (1.549 m)   Wt 154 lb (69.9 kg)   BMI 29.10 kg/m   Body mass index is 29.1 kg/m.  GENERAL: vitals reviewed and listed above, alert, oriented, appears well hydrated and in no acute distress HEENT: atraumatic, conjunctiva  clear, no obvious abnormalities on inspection of external nose and ears NECK: no obvious masses on inspection palpation  LUNGS: clear to auscultation bilaterally, no wheezes, rales or rhonchi, good air movement CV: HRRR, no clubbing cyanosis or  peripheral edema nl cap refill  Abdomen:  Sof,t normal bowel sounds without hepatosplenomegaly, no guarding rebound or masses no CVA tenderness MS: moves all extremities without noticeable focal  Abnormality somlimping  Favoring right attimes   PSYCH: pleasant and cooperative, no obvious depression or anxiety Lab Results  Component Value Date   WBC 4.2 08/20/2016   HGB 12.0 08/20/2016   HCT 36.3 08/20/2016   PLT 198.0 08/20/2016   GLUCOSE 148 (H) 08/20/2016   CHOL 160 08/20/2016   TRIG 214.0 (H) 08/20/2016   HDL 41.20 08/20/2016    LDLDIRECT 75.0 08/20/2016   LDLCALC 83 12/16/2013   ALT 15 02/27/2016   AST 18 02/27/2016   NA 142 08/20/2016   K 4.2 08/20/2016   CL 109 08/20/2016  CREATININE 1.12 08/20/2016   BUN 21 08/20/2016   CO2 30 08/20/2016   TSH 2.06 05/01/2015   INR 1.01 02/27/2016   HGBA1C 6.7 (H) 08/20/2016   MICROALBUR <0.2 12/27/2014   BP Readings from Last 3 Encounters:  08/27/16 130/64  06/28/16 122/70  06/06/16 129/69   Reviewed lab with patient  ASSESSMENT AND PLAN:  Discussed the following assessment and plan:  Type 2 diabetes mellitus without complication, without long-term current use of insulin ([place]) - controlled agree with avoiding low bg and contact us for dec med if so at risk med  Renal insufficiency - stable  Midline low back pain without sciatica, unspecified chronicity - sp surgery for spinal stenosis   Hx of right knee surgery  Medication management Can decrease B12 to 3 days a week. Iron levels are stable. Discussed musculoskeletal symptoms and timing of seeing her orthoses. She states she is up-to-date on bone density. Total visit 38mns > 50% spent counseling and coordinating care as indicated in above note and in instructions to patient .     -Patient advised to return or notify health care team  if  new concerns arise.  Patient Instructions  Would  contact  [place] about the knee and back pain if worsening for other opinions   Can  take the b12 every other day or so   .  Anemia is better .  Sugar control is better  Repeat BP was 130/62.  Which is good .   OV in 6 months   Can do a1c at the visit        WSandia Knolls Sarinah Doetsch M.D.

## 2016-08-27 ENCOUNTER — Ambulatory Visit: Payer: Medicare Other

## 2016-08-27 ENCOUNTER — Ambulatory Visit (INDEPENDENT_AMBULATORY_CARE_PROVIDER_SITE_OTHER): Payer: Medicare Other | Admitting: Internal Medicine

## 2016-08-27 ENCOUNTER — Encounter: Payer: Self-pay | Admitting: Internal Medicine

## 2016-08-27 VITALS — BP 130/64 | Temp 98.0°F | Ht 61.0 in | Wt 154.0 lb

## 2016-08-27 DIAGNOSIS — M545 Low back pain, unspecified: Secondary | ICD-10-CM

## 2016-08-27 DIAGNOSIS — Z79899 Other long term (current) drug therapy: Secondary | ICD-10-CM

## 2016-08-27 DIAGNOSIS — Z9889 Other specified postprocedural states: Secondary | ICD-10-CM | POA: Diagnosis not present

## 2016-08-27 DIAGNOSIS — E119 Type 2 diabetes mellitus without complications: Secondary | ICD-10-CM

## 2016-08-27 DIAGNOSIS — N289 Disorder of kidney and ureter, unspecified: Secondary | ICD-10-CM

## 2016-08-27 NOTE — Patient Instructions (Signed)
Would  contact  [place] about the knee and back pain if worsening for other opinions   Can  take the b12 every other day or so   .  Anemia is better .  Sugar control is better  Repeat BP was 130/62.  Which is good .   OV in 6 months   Can do a1c at the visit

## 2016-08-27 NOTE — Progress Notes (Signed)
Pre visit review using our clinic review tool, if applicable. No additional management support is needed unless otherwise documented below in the visit note.  Subjective:   [name] is a 83 y.o. female who presents for Medicare Annual (Subsequent) preventive examination.  THIS PATIENT DID NOT SHOW FOR HER AWV PRIOR TO SEEING DR. Haslett; PLEASE DISREGARD NOTE      Objective:     Vitals: There were no vitals taken for this visit.  There is no height or weight on file to calculate BMI.   Tobacco History  Smoking Status  . Never Smoker  Smokeless Tobacco  . Never Used     Counseling given: Not Answered   Past Medical History:  Diagnosis Date  . Anemia   . Arthritis   . Bilateral edema of lower extremity   . 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 Chronic lower back pain   . CKD (chronic kidney disease) stage 3, GFR 30-59 ml/min   . DDD (degenerative disc disease)    lumbar  . Diverticulosis   . Family history of breast cancer   . Family history of pancreatic cancer   . Family history of prostate cancer   . Flaccid neuropathic bladder, not elsewhere classified   . Foley catheter in place   . GERD (gastroesophageal reflux disease)   . Hemorrhoids   . Hiatal hernia   . History of colon polyps   . History of radiation therapy 09-12-2015 to 10-12-2015   42.72 gray in 16 fractions directed right breast w/ boost of 12 gray in 6 fractions directed at the lumpectomy cavity- Total dose: 54.72y  . History of recurrent UTIs   . Hyperlipidemia   . PONV (postoperative nausea and vomiting)   . RBBB (right bundle branch block with left anterior fascicular block)   . Type 2 diabetes mellitus ([place])   . Urinary retention with incomplete bladder emptying   . Wears  dentures    full upper and lower partial  . Wears glasses   . Wears hearing aid    bilateral but does not wear   Past Surgical History:  Procedure Laterality Date  . BREAST LUMPECTOMY WITH RADIOACTIVE SEED LOCALIZATION Right 08/03/2015   Procedure: BREAST LUMPECTOMY WITH RADIOACTIVE SEED LOCALIZATION;  Surgeon: [name], MD;  Location: [place];  Service: General;  Laterality: Right;  . CARDIOVASCULAR STRESS TEST  01/14/2012   Low risk nuclear study w/ small mild apical and apical lateral reversible perfusion defect represents a small area of ischemia versus shifting breast artifact/  normal LV funciton and wall motion, ef 86%  . CARPAL TUNNEL RELEASE Right 1977  . CATARACT EXTRACTION W/ INTRAOCULAR LENS  IMPLANT, BILATERAL Bilateral left 1996/  right 1997  . CLOSED RIGHT KNEE MANIPULATION  08/11/2002   post TKA  . CYSTOSTOMY N/A 05/17/2016   Procedure: CYSTOSCOPY WITH  SUPRAPUBIC PLACMENT;  Surgeon: [name], MD;  Location: [facility];  Service: Urology;  Laterality: N/A;  . GANGLION CYST EXCISION Right 12/09/2001   right palm  . KNEE ARTHROSCOPY Right 12/14/2002   w/ Lysis Adhesions  . LUMBAR LAMINECTOMY/DECOMPRESSION MICRODISCECTOMY N/A 03/06/2016   Procedure: CENTRAL DECOMPRESSION L3 - L4 ,L4 - L5;  Surgeon: [name], MD;  Location: [facility];  Service: Orthopedics;  Laterality: N/A;  . SHOULDER HEMI-ARTHROPLASTY Right 02/20/2009   avascular necrosis   . TOTAL KNEE ARTHROPLASTY Bilateral right 06-23-2002/  left 07-11-2008  . [place]  . VAGINAL HYSTERECTOMY  1975   Family History  Problem Relation Age of Onset  . Pancreatic cancer Mother 22  . Diabetes Mother   . Prostate cancer Brother   . Diabetes Father   . Heart disease Father   . Diabetes Brother   . Heart attack Brother   . Diabetes Sister   . Diabetes Daughter   . Diabetes Son   . Coronary artery disease Brother     MI in his 22s  . Colon cancer  Neg Hx   . Stomach cancer Neg Hx    History  Sexual Activity  . Sexual activity: No    Outpatient Encounter Prescriptions as of 08/27/2016  Medication Sig  . acetaminophen (TYLENOL) 500 MG tablet Take 500 mg by mouth every 6 (six) hours as needed for mild pain.  Marland Kitchen aspirin EC 81 MG tablet Take 81 mg by mouth daily.  Marland Kitchen atorvastatin (LIPITOR) 10 MG tablet Take 1 tablet (10 mg total) by mouth daily. (Patient taking differently: Take 10 mg by mouth every morning. )  . calcium carbonate (TUMS - DOSED IN MG ELEMENTAL CALCIUM) 500 MG chewable tablet Chew 1 tablet by mouth as needed for indigestion or heartburn.  . Calcium Citrate-Vitamin D (CALCIUM CITRATE +D PO) Take 630 mg by mouth 2 (two) times daily. Vit d3 is 500iu  . Cholecalciferol (VITAMIN D3) 2000 units TABS Take by mouth every morning.  . Cyanocobalamin (B-12 PO) Take 1 tablet by mouth daily.  . furosemide (LASIX) 40 MG tablet TAKE 1 TABLET DAILY FOR    EDEMA (Patient taking differently: Take 40 mg by mouth every morning. TAKE 1 TABLET DAILY FOR    EDEMA)  . gabapentin (NEURONTIN) 300 MG capsule TAKE 1 CAPSULE(300 MG) BY MOUTH AT BEDTIME  . glipiZIDE (GLUCOTROL XL) 5 MG 24 hr tablet Take 1 tablet (5 mg total) by mouth daily. (Patient taking differently: Take 5 mg by mouth daily with breakfast. )  . HYDROcodone-acetaminophen (NORCO) 7.5-325 MG tablet Take 1-2 tablets by mouth every 4 (four) hours as needed for moderate pain. Maximum dose per 24 hours - 8 pills (Patient not taking: Reported on 06/28/2016)  . Potassium 99 MG TABS Take 1 tablet by mouth every morning.  . ranitidine (ZANTAC) 150 MG tablet Take 150 mg by mouth 2 (two) times daily as needed for heartburn.   No facility-administered encounter medications on file as of 08/27/2016.     Activities of Daily Living In your present state of health, do you have any difficulty performing the following activities: 05/17/2016 03/06/2016  Hearing? N Y  Vision? N N  Difficulty  concentrating or making decisions? N N  Walking or climbing stairs? N N  Dressing or bathing? N N  Doing errands, shopping? - N  Some recent data might be hidden    Patient Care Team: Burnis Medin, MD as PCP - General (Internal Medicine) Monna Fam, MD (Ophthalmology) Sylvan Cheese, NP as Nurse Practitioner (Hematology and Oncology) Nicholas Lose, MD as Consulting Physician (Hematology and Oncology) Gery Pray, MD as Consulting Physician (Radiation Oncology) [name], MD as Consulting Physician (General Surgery)    Assessment:     Exercise Activities and Dietary recommendations    Goals    None     Fall Risk Fall Risk  06/28/2016  06/06/2016 12/15/2015 08/17/2015 05/01/2015  Falls in the past year? Yes No No No No  Number falls in past yr: 1 - - - -  Injury with Fall? Yes - - - -   Depression Screen PHQ 2/9 Scores 06/28/2016 06/06/2016 12/15/2015 08/17/2015  PHQ - 2 Score 0 0 0 0     Cognitive Function        Immunization History  Administered Date(s) Administered  . Influenza Split 05/08/2011, 04/21/2012  . Influenza Whole 03/11/2006, 04/30/2007, 04/13/2008, 05/17/2009, 04/24/2010  . Influenza, High Dose Seasonal PF 05/11/2014, 05/01/2015, 04/26/2016  . Influenza,inj,Quad PF,36+ Mos 03/22/2013  . Pneumococcal Conjugate-13 12/16/2013  . Pneumococcal Polysaccharide-23 05/09/1999, 05/18/2008  . Tetanus 01/11/2013  . Zoster 05/06/2006   Screening Tests Health Maintenance  Topic Date Due  . DEXA SCAN  02/20/1997  . URINE MICROALBUMIN  12/27/2015  . HEMOGLOBIN A1C  02/17/2017  . FOOT EXAM  04/26/2017  . OPHTHALMOLOGY EXAM  06/17/2017  . TETANUS/TDAP  01/12/2023  . INFLUENZA VACCINE  Completed  . PNA vac Low Risk Adult  Completed      Plan:   During the course of the visit the patient was educated and counseled about the following appropriate screening and preventive services:   Vaccines to include Pneumoccal, Influenza, Hepatitis B, Td,  Zostavax, HCV  Electrocardiogram  Cardiovascular Disease  Colorectal cancer screening  Bone density screening  Diabetes screening  Glaucoma screening  Mammography/PAP  Nutrition counseling   Patient Instructions (the written plan) was given to the patient.   NIOEV,OJJKK, RN  08/27/2016   NO SHOW FOR AWV; PLEASE DISREGARD THIS NOTE

## 2016-09-03 ENCOUNTER — Encounter (HOSPITAL_COMMUNITY): Payer: Self-pay

## 2016-09-03 DIAGNOSIS — Z853 Personal history of malignant neoplasm of breast: Secondary | ICD-10-CM | POA: Diagnosis not present

## 2016-09-03 LAB — HM MAMMOGRAPHY

## 2016-09-06 ENCOUNTER — Encounter: Payer: Self-pay | Admitting: Internal Medicine

## 2016-09-06 DIAGNOSIS — C50911 Malignant neoplasm of unspecified site of right female breast: Secondary | ICD-10-CM | POA: Diagnosis not present

## 2016-09-12 ENCOUNTER — Ambulatory Visit (INDEPENDENT_AMBULATORY_CARE_PROVIDER_SITE_OTHER): Payer: Medicare Other

## 2016-09-12 VITALS — BP 116/68 | HR 75 | Ht 61.0 in | Wt 158.2 lb

## 2016-09-12 DIAGNOSIS — Z0000 Encounter for general adult medical examination without abnormal findings: Secondary | ICD-10-CM

## 2016-09-12 DIAGNOSIS — E119 Type 2 diabetes mellitus without complications: Secondary | ICD-10-CM

## 2016-09-12 LAB — MICROALBUMIN / CREATININE URINE RATIO
Creatinine,U: 100.4 mg/dL
Microalb Creat Ratio: 1.3 mg/g (ref 0.0–30.0)
Microalb, Ur: 1.3 mg/dL (ref 0.0–1.9)

## 2016-09-12 NOTE — Patient Instructions (Addendum)
[name] , Thank you for taking time to come for your Medicare Wellness Visit. I appreciate your ongoing commitment to your health goals. Please review the following plan we discussed and let me know if I can assist you in the future.   Declines dexa for now   Will get microalbumin today ua specimen today    These are the goals we discussed: Goals    . Exercise 150 minutes per week (moderate activity)          Will walk on the treadmill  Start out slow and low; and build minutes per week  May try stationary bike        This is a list of the screening recommended for you and due dates:  Health Maintenance  Topic Date Due  . DEXA scan (bone density measurement)  02/20/1997  . Urine Protein Check  12/27/2015  . Hemoglobin A1C  02/17/2017  . Complete foot exam   04/26/2017  . Eye exam for diabetics  06/17/2017  . Tetanus Vaccine  01/12/2023  . Flu Shot  Completed  . Pneumonia vaccines  Completed      Fall Prevention in the Home Falls can cause injuries. They can happen to people of all ages. There are many things you can do to make your home safe and to help prevent falls. What can I do on the outside of my home?  Regularly fix the edges of walkways and driveways and fix any cracks.  Remove anything that might make you trip as you walk through a door, such as a raised step or threshold.  Trim any bushes or trees on the path to your home.  Use bright outdoor lighting.  Clear any walking paths of anything that might make someone trip, such as rocks or tools.  Regularly check to see if handrails are loose or broken. Make sure that both sides of any steps have handrails.  Any raised decks and porches should have guardrails on the edges.  Have any leaves, snow, or ice cleared regularly.  Use sand or salt on walking paths during winter.  Clean up any spills in your garage right away. This includes oil or grease spills. What can I do in the bathroom?  Use night  lights.  Install grab bars by the toilet and in the tub and shower. Do not use towel bars as grab bars.  Use non-skid mats or decals in the tub or shower.  If you need to sit down in the shower, use a plastic, non-slip stool.  Keep the floor dry. Clean up any water that spills on the floor as soon as it happens.  Remove soap buildup in the tub or shower regularly.  Attach bath mats securely with double-sided non-slip rug tape.  Do not have throw rugs and other things on the floor that can make you trip. What can I do in the bedroom?  Use night lights.  Make sure that you have a light by your bed that is easy to reach.  Do not use any sheets or blankets that are too big for your bed. They should not hang down onto the floor.  Have a firm chair that has side arms. You can use this for support while you get dressed.  Do not have throw rugs and other things on the floor that can make you trip. What can I do in the kitchen?  Clean up any spills right away.  Avoid walking on wet floors.  Keep  items that you use a lot in easy-to-reach places.  If you need to reach something above you, use a strong step stool that has a grab bar.  Keep electrical cords out of the way.  Do not use floor polish or wax that makes floors slippery. If you must use wax, use non-skid floor wax.  Do not have throw rugs and other things on the floor that can make you trip. What can I do with my stairs?  Do not leave any items on the stairs.  Make sure that there are handrails on both sides of the stairs and use them. Fix handrails that are broken or loose. Make sure that handrails are as long as the stairways.  Check any carpeting to make sure that it is firmly attached to the stairs. Fix any carpet that is loose or worn.  Avoid having throw rugs at the top or bottom of the stairs. If you do have throw rugs, attach them to the floor with carpet tape.  Make sure that you have a light switch at the  top of the stairs and the bottom of the stairs. If you do not have them, ask someone to add them for you. What else can I do to help prevent falls?  Wear shoes that:  Do not have high heels.  Have rubber bottoms.  Are comfortable and fit you well.  Are closed at the toe. Do not wear sandals.  If you use a stepladder:  Make sure that it is fully opened. Do not climb a closed stepladder.  Make sure that both sides of the stepladder are locked into place.  Ask someone to hold it for you, if possible.  Clearly mark and make sure that you can see:  Any grab bars or handrails.  First and last steps.  Where the edge of each step is.  Use tools that help you move around (mobility aids) if they are needed. These include:  Canes.  Walkers.  Scooters.  Crutches.  Turn on the lights when you go into a dark area. Replace any light bulbs as soon as they burn out.  Set up your furniture so you have a clear path. Avoid moving your furniture around.  If any of your floors are uneven, fix them.  If there are any pets around you, be aware of where they are.  Review your medicines with your doctor. Some medicines can make you feel dizzy. This can increase your chance of falling. Ask your doctor what other things that you can do to help prevent falls. This information is not intended to replace advice given to you by your health care provider. Make sure you discuss any questions you have with your health care provider. Document Released: 04/20/2009 Document Revised: 11/30/2015 Document Reviewed: 07/29/2014 Elsevier Interactive Patient Education  2017 [place] Maintenance, Female Adopting a healthy lifestyle and getting preventive care can go a long way to promote health and wellness. Talk with your health care provider about what schedule of regular examinations is right for you. This is a good chance for you to check in with your provider about disease prevention and  staying healthy. In between checkups, there are plenty of things you can do on your own. Experts have done a lot of research about which lifestyle changes and preventive measures are most likely to keep you healthy. Ask your health care provider for more information. Weight and diet Eat a healthy diet  Be sure to include  plenty of vegetables, fruits, low-fat dairy products, and lean protein.  Do not eat a lot of foods high in solid fats, added sugars, or salt.  Get regular exercise. This is one of the most important things you can do for your health.  Most adults should exercise for at least 150 minutes each week. The exercise should increase your heart rate and make you sweat (moderate-intensity exercise).  Most adults should also do strengthening exercises at least twice a week. This is in addition to the moderate-intensity exercise. Maintain a healthy weight  Body mass index (BMI) is a measurement that can be used to identify possible weight problems. It estimates body fat based on height and weight. Your health care provider can help determine your BMI and help you achieve or maintain a healthy weight.  For females 57 years of age and older:  A BMI below 18.5 is considered underweight.  A BMI of 18.5 to 24.9 is normal.  A BMI of 25 to 29.9 is considered overweight.  A BMI of 30 and above is considered obese. Watch levels of cholesterol and blood lipids  You should start having your blood tested for lipids and cholesterol at 81 years of age, then have this test every 5 years.  You may need to have your cholesterol levels checked more often if:  Your lipid or cholesterol levels are high.  You are older than 81 years of age.  You are at high risk for heart disease. Cancer screening Lung Cancer  Lung cancer screening is recommended for adults 73-71 years old who are at high risk for lung cancer because of a history of smoking.  A yearly low-dose CT scan of the lungs is  recommended for people who:  Currently smoke.  Have quit within the past 15 years.  Have at least a 30-pack-year history of smoking. A pack year is smoking an average of one pack of cigarettes a day for 1 year.  Yearly screening should continue until it has been 15 years since you quit.  Yearly screening should stop if you develop a health problem that would prevent you from having lung cancer treatment. Breast Cancer  Practice breast self-awareness. This means understanding how your breasts normally appear and feel.  It also means doing regular breast self-exams. Let your health care provider know about any changes, no matter how small.  If you are in your 20s or 30s, you should have a clinical breast exam (CBE) by a health care provider every 1-3 years as part of a regular health exam.  If you are 67 or older, have a CBE every year. Also consider having a breast X-ray (mammogram) every year.  If you have a family history of breast cancer, talk to your health care provider about genetic screening.  If you are at high risk for breast cancer, talk to your health care provider about having an MRI and a mammogram every year.  Breast cancer gene (BRCA) assessment is recommended for women who have family members with BRCA-related cancers. BRCA-related cancers include:  Breast.  Ovarian.  Tubal.  Peritoneal cancers.  Results of the assessment will determine the need for genetic counseling and BRCA1 and BRCA2 testing. Cervical Cancer  Your health care provider may recommend that you be screened regularly for cancer of the pelvic organs (ovaries, uterus, and vagina). This screening involves a pelvic examination, including checking for microscopic changes to the surface of your cervix (Pap test). You may be encouraged to have this screening  done every 3 years, beginning at age 39.  For women ages 9-65, health care providers may recommend pelvic exams and Pap testing every 3 years, or  they may recommend the Pap and pelvic exam, combined with testing for human papilloma virus (HPV), every 5 years. Some types of HPV increase your risk of cervical cancer. Testing for HPV may also be done on women of any age with unclear Pap test results.  Other health care providers may not recommend any screening for nonpregnant women who are considered low risk for pelvic cancer and who do not have symptoms. Ask your health care provider if a screening pelvic exam is right for you.  If you have had past treatment for cervical cancer or a condition that could lead to cancer, you need Pap tests and screening for cancer for at least 20 years after your treatment. If Pap tests have been discontinued, your risk factors (such as having a new sexual partner) need to be reassessed to determine if screening should resume. Some women have medical problems that increase the chance of getting cervical cancer. In these cases, your health care provider may recommend more frequent screening and Pap tests. Colorectal Cancer  This type of cancer can be detected and often prevented.  Routine colorectal cancer screening usually begins at 81 years of age and continues through 81 years of age.  Your health care provider may recommend screening at an earlier age if you have risk factors for colon cancer.  Your health care provider may also recommend using home test kits to check for hidden blood in the stool.  A small camera at the end of a tube can be used to examine your colon directly (sigmoidoscopy or colonoscopy). This is done to check for the earliest forms of colorectal cancer.  Routine screening usually begins at age 57.  Direct examination of the colon should be repeated every 5-10 years through 82 years of age. However, you may need to be screened more often if early forms of precancerous polyps or small growths are found. Skin Cancer  Check your skin from head to toe regularly.  Tell your health care  provider about any new moles or changes in moles, especially if there is a change in a mole's shape or color.  Also tell your health care provider if you have a mole that is larger than the size of a pencil eraser.  Always use sunscreen. Apply sunscreen liberally and repeatedly throughout the day.  Protect yourself by wearing long sleeves, pants, a wide-brimmed hat, and sunglasses whenever you are outside. Heart disease, diabetes, and high blood pressure  High blood pressure causes heart disease and increases the risk of stroke. High blood pressure is more likely to develop in:  People who have blood pressure in the high end of the normal range (130-139/85-89 mm Hg).  People who are overweight or obese.  People who are African American.  If you are 25-56 years of age, have your blood pressure checked every 3-5 years. If you are 49 years of age or older, have your blood pressure checked every year. You should have your blood pressure measured twice-once when you are at a hospital or clinic, and once when you are not at a hospital or clinic. Record the average of the two measurements. To check your blood pressure when you are not at a hospital or clinic, you can use:  An automated blood pressure machine at a pharmacy.  A home blood pressure monitor.  If you are between 1 years and 4 years old, ask your health care provider if you should take aspirin to prevent strokes.  Have regular diabetes screenings. This involves taking a blood sample to check your fasting blood sugar level.  If you are at a normal weight and have a low risk for diabetes, have this test once every three years after 81 years of age.  If you are overweight and have a high risk for diabetes, consider being tested at a younger age or more often. Preventing infection Hepatitis B  If you have a higher risk for hepatitis B, you should be screened for this virus. You are considered at high risk for hepatitis B if:  You  were born in a country where hepatitis B is common. Ask your health care provider which countries are considered high risk.  Your parents were born in a high-risk country, and you have not been immunized against hepatitis B (hepatitis B vaccine).  You have HIV or AIDS.  You use needles to inject street drugs.  You live with someone who has hepatitis B.  You have had sex with someone who has hepatitis B.  You get hemodialysis treatment.  You take certain medicines for conditions, including cancer, organ transplantation, and autoimmune conditions. Hepatitis C  Blood testing is recommended for:  Everyone born from [birth year] through 1963.  Anyone with known risk factors for hepatitis C. Sexually transmitted infections (STIs)  You should be screened for sexually transmitted infections (STIs) including gonorrhea and chlamydia if:  You are sexually active and are younger than 81 years of age.  You are older than 81 years of age and your health care provider tells you that you are at risk for this type of infection.  Your sexual activity has changed since you were last screened and you are at an increased risk for chlamydia or gonorrhea. Ask your health care provider if you are at risk.  If you do not have HIV, but are at risk, it may be recommended that you take a prescription medicine daily to prevent HIV infection. This is called pre-exposure prophylaxis (PrEP). You are considered at risk if:  You are sexually active and do not regularly use condoms or know the HIV status of your partner(s).  You take drugs by injection.  You are sexually active with a partner who has HIV. Talk with your health care provider about whether you are at high risk of being infected with HIV. If you choose to begin PrEP, you should first be tested for HIV. You should then be tested every 3 months for as long as you are taking PrEP. Pregnancy  If you are premenopausal and you may become pregnant, ask your  health care provider about preconception counseling.  If you may become pregnant, take 400 to 800 micrograms (mcg) of folic acid every day.  If you want to prevent pregnancy, talk to your health care provider about birth control (contraception). Osteoporosis and menopause  Osteoporosis is a disease in which the bones lose minerals and strength with aging. This can result in serious bone fractures. Your risk for osteoporosis can be identified using a bone density scan.  If you are 2 years of age or older, or if you are at risk for osteoporosis and fractures, ask your health care provider if you should be screened.  Ask your health care provider whether you should take a calcium or vitamin D supplement to lower your risk for osteoporosis.  Menopause may  have certain physical symptoms and risks.  Hormone replacement therapy may reduce some of these symptoms and risks. Talk to your health care provider about whether hormone replacement therapy is right for you. Follow these instructions at home:  Schedule regular health, dental, and eye exams.  Stay current with your immunizations.  Do not use any tobacco products including cigarettes, chewing tobacco, or electronic cigarettes.  If you are pregnant, do not drink alcohol.  If you are breastfeeding, limit how much and how often you drink alcohol.  Limit alcohol intake to no more than 1 drink per day for nonpregnant women. One drink equals 12 ounces of beer, 5 ounces of wine, or 1 ounces of hard liquor.  Do not use street drugs.  Do not share needles.  Ask your health care provider for help if you need support or information about quitting drugs.  Tell your health care provider if you often feel depressed.  Tell your health care provider if you have ever been abused or do not feel safe at home. This information is not intended to replace advice given to you by your health care provider. Make sure you discuss any questions you have  with your health care provider. Document Released: 01/07/2011 Document Revised: 11/30/2015 Document Reviewed: 03/28/2015 Elsevier Interactive Patient Education  2017 [employer].

## 2016-09-12 NOTE — Progress Notes (Addendum)
Subjective:   [name] is a 83 y.o. female who presents for Medicare Annual (Subsequent) preventive examination.  The Patient was informed that the wellness visit is to identify future health risk and educate and initiate measures that can reduce risk for increased disease through the lifespan.    NO ROS; Medicare Wellness Visit [place] 02/2016 still has back pain; is it about 8 when she starts to work  Hx of breast cancer Jan 2017 and took 21 radiation tx Dr. Karsten Ro now following for  supra pubic 05/17/2016 s/p back surgery  Last OV was 10/17 with Panosh  Reschedule for 02/25/2016   Describes health as good, fair or great?  Ok  Preventive Screening -Counseling & Management  DEXA scan / has had years ago; DECLINE Urine Microalbumin taken today Mammogram 08/2016 [facility] / has seen by surgeon this year and every thing is good    Smoking history neg  Second Hand Smoke status; No Smokers in the home ETOH no  Medication adherence or issues?  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 Tomi Likens who encouraged walking Start out slow and low  Cardiac Risk Factors:  Advanced aged > 21 in men; >65 in women Hyperlipidemia LDL 8 Diabetes well managed  Family History mother had pancreatic can and DM HD and DM  Obesity BMI 29  Fall risk  Given education on "Fall Prevention in the Home" for more safety tips the patient can apply as appropriate.  Long term goal is to "age in place" or undecided   Mobility of Functional changes this year?  no   Mental Health:  Any emotional problems? Anxious, depressed, irritable, sad or blue? no Denies feeling depressed or hopeless; voices pleasure in daily life How many social activities have you been engaged in within the last 2 weeks? no  Hearing Screening Comments: Has hearing aid but wears them watching tv  Vision Screening Comments: Gets eye checked every year  Dr. 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 [name] added to list for UR  Immunization History  Administered Date(s) Administered  . Influenza Split 05/08/2011, 04/21/2012  . Influenza Whole 03/11/2006, 04/30/2007, 04/13/2008, 05/17/2009, 04/24/2010  . Influenza, High Dose Seasonal PF 05/11/2014, 05/01/2015, 04/26/2016  . Influenza,inj,Quad PF,36+ Mos 03/22/2013  . Pneumococcal Conjugate-13  12/16/2013  . Pneumococcal Polysaccharide-23 05/09/1999, 05/18/2008  . Tetanus 01/11/2013  . Zoster 05/06/2006   Required Immunizations needed today  Screening test up to date or reviewed for plan of completion There are no preventive care reminders to display for this patient.  Cardiac Risk Factors include: advanced age (>49mn, >>3women);dyslipidemia;diabetes mellitus;family history of  premature cardiovascular disease;obesity (BMI >30kg/m2);sedentary lifestyleDeclines dexa; has had these in the past; declines future exams    Agreed to microablumin today  Objective:     Vitals: BP 116/68   Pulse 75   Ht 5' 1"  (1.549 m)   Wt 158 lb 4 oz (71.8 kg)   SpO2 94%   BMI 29.90 kg/m   Body mass index is 29.9 kg/m.   Tobacco History  Smoking Status  . Never Smoker  Smokeless Tobacco  . Never Used     Counseling given: Yes   Past Medical History:  Diagnosis Date  . Anemia   . Arthritis   . Bilateral edema of lower extremity   . 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 Chronic lower back pain   . CKD (chronic kidney disease) stage 3, GFR 30-59 ml/min   . DDD (degenerative disc disease)    lumbar  . Diverticulosis   . Family history of breast cancer   . Family history of pancreatic cancer   . Family history of prostate cancer   . Flaccid neuropathic bladder, not elsewhere classified   . Foley catheter in place   . GERD (gastroesophageal reflux disease)   . Hemorrhoids   . Hiatal hernia   . History of colon polyps   . History of radiation therapy 09-12-2015 to 10-12-2015   42.72 gray in 16 fractions directed right breast w/ boost of 12 gray in 6 fractions directed at the lumpectomy cavity- Total dose: 54.72y  . History of recurrent UTIs   . Hyperlipidemia   . PONV (postoperative nausea and vomiting)   . RBBB (right bundle branch block with left anterior fascicular block)   . [place]   . Urinary retention with incomplete bladder emptying   . Wears dentures    full upper and lower partial  . Wears glasses   . Wears hearing aid    bilateral but does not wear   Past Surgical History:  Procedure Laterality Date  . BREAST  LUMPECTOMY WITH RADIOACTIVE SEED LOCALIZATION Right 08/03/2015   Procedure: BREAST LUMPECTOMY WITH RADIOACTIVE SEED LOCALIZATION;  Surgeon: MRolm Bookbinder MD;  [facility]  Service: General;  Laterality: Right;  . CARDIOVASCULAR STRESS TEST  01/14/2012   Low risk nuclear study w/ small mild apical and apical lateral reversible perfusion defect represents a small area of ischemia versus shifting breast artifact/  normal LV funciton and wall motion, ef 86%  . CARPAL TUNNEL RELEASE Right 1977  . CATARACT EXTRACTION W/ INTRAOCULAR LENS  IMPLANT, BILATERAL Bilateral left 1996/  right 1997  . CLOSED RIGHT KNEE MANIPULATION  08/11/2002   post TKA  . CYSTOSTOMY N/A 05/17/2016   Procedure: CYSTOSCOPY WITH  SUPRAPUBIC PLACMENT;  Surgeon: MKathie Rhodes MD;  [facility]  Service: Urology;  Laterality: N/A;  . GANGLION CYST EXCISION Right 12/09/2001   right palm  . KNEE ARTHROSCOPY Right 12/14/2002  w/ Lysis Adhesions  . LUMBAR LAMINECTOMY/DECOMPRESSION MICRODISCECTOMY N/A 03/06/2016   Procedure: CENTRAL DECOMPRESSION L3 - L4 ,L4 - L5;  Surgeon: [name], MD;  Location[facility];  Service: Orthopedics;  Laterality: N/A;  . SHOULDER HEMI-ARTHROPLASTY Right 02/20/2009   avascular necrosis   . TOTAL KNEE ARTHROPLASTY Bilateral right 06-23-2002/  left 07-11-2008  . [place]  . VAGINAL HYSTERECTOMY  1975   Family History  Problem Relation Age of Onset  . Pancreatic cancer Mother 29  . Diabetes Mother   . Prostate cancer Brother   . Diabetes Father   . Heart disease Father   . Diabetes Brother   . Heart attack Brother   . Diabetes Sister   . Diabetes Daughter   . Diabetes Son   . Coronary artery disease Brother     MI in his 21s  . Colon cancer Neg Hx   . Stomach cancer Neg Hx    History  Sexual Activity  . Sexual activity: No    Outpatient Encounter Prescriptions as of 09/12/2016  Medication Sig  . Cyanocobalamin  (B-12 PO) Take 1 tablet by mouth daily.  Marland Kitchen acetaminophen (TYLENOL) 500 MG tablet Take 500 mg by mouth every 6 (six) hours as needed for mild pain.  Marland Kitchen aspirin EC 81 MG tablet Take 81 mg by mouth daily.  Marland Kitchen atorvastatin (LIPITOR) 10 MG tablet Take 1 tablet (10 mg total) by mouth daily. (Patient taking differently: Take 10 mg by mouth every morning. )  . calcium carbonate (TUMS - DOSED IN MG ELEMENTAL CALCIUM) 500 MG chewable tablet Chew 1 tablet by mouth as needed for indigestion or heartburn.  . Calcium Citrate-Vitamin D (CALCIUM CITRATE +D PO) Take 630 mg by mouth 2 (two) times daily. Vit d3 is 500iu  . Cholecalciferol (VITAMIN D3) 2000 units TABS Take by mouth every morning.  Marland Kitchen esomeprazole (NEXIUM) 40 MG capsule Take 40 mg by mouth daily at 12 noon.  . furosemide (LASIX) 40 MG tablet TAKE 1 TABLET DAILY FOR    EDEMA (Patient taking differently: Take 40 mg by mouth every morning. TAKE 1 TABLET DAILY FOR    EDEMA)  . gabapentin (NEURONTIN) 300 MG capsule TAKE 1 CAPSULE(300 MG) BY MOUTH AT BEDTIME  . glipiZIDE (GLUCOTROL XL) 5 MG 24 hr tablet Take 1 tablet (5 mg total) by mouth daily. (Patient taking differently: Take 5 mg by mouth daily with breakfast. )  . Potassium 99 MG TABS Take 1 tablet by mouth every morning.   No facility-administered encounter medications on file as of 09/12/2016.     Activities of Daily Living In your present state of health, do you have any difficulty performing the following activities: 09/12/2016 05/17/2016  Hearing? N N  Vision? N N  Difficulty concentrating or making decisions? N N  Walking or climbing stairs? N N  Dressing or bathing? N N  Doing errands, shopping? N -  Preparing Food and eating ? N -  Using the Toilet? N -  In the past six months, have you accidently leaked urine? (No Data) -  Do you have problems with loss of bowel control? N -  Managing your Medications? N -  Managing your Finances? N -  Housekeeping or managing your Housekeeping? N -  Some  recent data might be hidden    Patient Care Team: Burnis Medin, MD as PCP - General (Internal Medicine) Monna Fam, MD (Ophthalmology) Sylvan Cheese, NP as Nurse Practitioner (Hematology and Oncology) Loleta Dicker  Lindi Adie, MD as Consulting Physician (Hematology and Oncology) Gery Pray, MD as Consulting Physician (Radiation Oncology) [name], MD as Consulting Physician (General Surgery) [name], MD as Consulting Physician (Urology)    Assessment:     Exercise Activities and Dietary recommendations Current Exercise Habits: Home exercise routine, Type of exercise: walking  Goals    . Exercise 150 minutes per week (moderate activity)          Will walk on the treadmill  Start out slow and low; and build minutes per week  May try stationary bike       Fall Risk Fall Risk  09/12/2016 06/28/2016 06/06/2016 12/15/2015 08/17/2015  Falls in the past year? No Yes No No No  Number falls in past yr: - 1 - - -  Injury with Fall? - Yes - - -   Depression Screen PHQ 2/9 Scores 09/12/2016 06/28/2016 06/06/2016 12/15/2015  PHQ - 2 Score 0 0 0 0     Cognitive Function MMSE - Mini Mental State Exam 09/12/2016  Not completed: (No Data)    Ad8 score is 0     Immunization History  Administered Date(s) Administered  . Influenza Split 05/08/2011, 04/21/2012  . Influenza Whole 03/11/2006, 04/30/2007, 04/13/2008, 05/17/2009, 04/24/2010  . Influenza, High Dose Seasonal PF 05/11/2014, 05/01/2015, 04/26/2016  . Influenza,inj,Quad PF,36+ Mos 03/22/2013  . Pneumococcal Conjugate-13 12/16/2013  . Pneumococcal Polysaccharide-23 05/09/1999, 05/18/2008  . Tetanus 01/11/2013  . Zoster 05/06/2006   Screening Tests Health Maintenance  Topic Date Due  . DEXA SCAN  09/05/2028 (Originally 02/20/1997)  . HEMOGLOBIN A1C  02/17/2017  . FOOT EXAM  04/26/2017  . OPHTHALMOLOGY EXAM  06/17/2017  . URINE MICROALBUMIN  09/12/2017  . TETANUS/TDAP  01/12/2023  . INFLUENZA VACCINE  Completed   . PNA vac Low Risk Adult  Completed      Plan:      PCP Notes  Health Maintenance Declines dexa will postpone as she states she does not want to have any more of these  Agreed to micoralbumin and ua sent to lab   Abnormal Screens  none  Referrals  none  Patient concerns; Tolerating suprapubic well; C/o of back pain but has a plan to start exercising on a treadmill  Nurse Concerns; none  Next PCP apt in August    During the course of the visit the patient was educated and counseled about the following appropriate screening and preventive services:   Vaccines to include Pneumoccal, Influenza, Hepatitis B, Td, Zostavax, HCV  Electrocardiogram  Cardiovascular Disease  Colorectal cancer screening aged out  Bone density screening  Diabetes screening  Glaucoma screening  Mammography/PAP  Nutrition counseling   Patient Instructions (the written plan) was given to the patient.   Lottie Dawson, MD  09/13/2016   Above note reviewed agree with plan. Lottie Dawson, MD

## 2016-09-13 ENCOUNTER — Telehealth: Payer: Self-pay

## 2016-09-13 NOTE — Telephone Encounter (Signed)
Health/ Risk Assessment scanned to chart AWV 09/12/2016

## 2016-09-25 DIAGNOSIS — N312 Flaccid neuropathic bladder, not elsewhere classified: Secondary | ICD-10-CM | POA: Diagnosis not present

## 2016-10-19 ENCOUNTER — Other Ambulatory Visit: Payer: Self-pay | Admitting: Internal Medicine

## 2016-10-30 DIAGNOSIS — R338 Other retention of urine: Secondary | ICD-10-CM | POA: Diagnosis not present

## 2016-11-05 ENCOUNTER — Ambulatory Visit (HOSPITAL_BASED_OUTPATIENT_CLINIC_OR_DEPARTMENT_OTHER): Payer: Medicare Other | Admitting: Adult Health

## 2016-11-05 ENCOUNTER — Encounter: Payer: Self-pay | Admitting: Adult Health

## 2016-11-05 ENCOUNTER — Encounter: Payer: Medicare Other | Admitting: Nurse Practitioner

## 2016-11-05 VITALS — BP 125/52 | HR 88 | Temp 97.8°F | Resp 20 | Wt 155.8 lb

## 2016-11-05 DIAGNOSIS — Z171 Estrogen receptor negative status [ER-]: Secondary | ICD-10-CM

## 2016-11-05 DIAGNOSIS — C50411 Malignant neoplasm of upper-outer quadrant of right female breast: Secondary | ICD-10-CM

## 2016-11-05 DIAGNOSIS — Z853 Personal history of malignant neoplasm of breast: Secondary | ICD-10-CM

## 2016-11-05 NOTE — Progress Notes (Signed)
CLINIC:  Survivorship   REASON FOR VISIT:  Routine follow-up for history of breast cancer.   BRIEF ONCOLOGIC HISTORY:  Oncology History         Breast cancer of upper-outer quadrant of right female breast ([place])   06/27/2015 Mammogram    New oval focal asymmetry in right breast, posterior depth superior region      07/19/2015 Initial Diagnosis    Right breast biopsy: Invasive ductal carcinoma grade 2-3, PR 0%, PR 0%, Ki-67 20%, HER-2 negative ratio 1.77; 5 mm abnormality at 10:00 position      07/19/2015 Clinical Stage    Stage IA: T1b N0      08/02/2015 Procedure    Breast/Ovarian panel (geneDx) no deleterious mutations at ATM, BARD1, BRCA1, BRCA2, BRIP1, CDH1, CHEK2, EPCAM, FANCC, MLH1, MSH2, MSH6, NBN, PALB2, PMS2, PTEN, RAD51C, RAD51D, TP53, and XRCC2      08/03/2015 Surgery    Rt. Lumpectomy: IDC, 1.2 cm, Grade 3, with DCIS (in situ margin 0.1 cm), LN not taken, Er 0%, PR 0%, Her 2 Neg equivocal on FISH (ratio 1.93),  Negative for HER2 on IHC.      08/03/2015 Pathologic Stage    Stage IA: T1c Nx      09/13/2015 - 10/12/2015 Radiation Therapy    Adjuvant radiation therapy: 42.72 gray in 16 fractions directed right breast with a boost of 12 gray in 6 fractions directed at the lumpectomy cavity. Total dose: 54.72 Gy      12/21/2015 Survivorship    SCP visit completed        INTERVAL HISTORY:  Ms. Sharples presents to the [facility] today for routine follow-up for her history of breast cancer.  Overall, she reports feeling quite well. She is walking on a treadmill.  She sees her PCP every 6 months.  She follows up with Dr. Sondra Come in addition to Dr. Donne Hazel.   She has no specific concerns or questions today.       REVIEW OF SYSTEMS:  Review of Systems  Constitutional: Negative for appetite change, chills, diaphoresis, fatigue, fever and unexpected weight change.  HENT:   Negative for lump/mass, mouth sores, sore throat and trouble swallowing.   Eyes: Negative  for eye problems and icterus.  Respiratory: Negative for chest tightness, cough and shortness of breath.   Cardiovascular: Negative for chest pain, leg swelling and palpitations.  Gastrointestinal: Negative for abdominal distention, abdominal pain, blood in stool, constipation and diarrhea.  Endocrine: Negative for hot flashes.  Genitourinary: Negative for difficulty urinating and vaginal discharge.   Musculoskeletal: Negative for arthralgias, back pain and gait problem.  Skin: Negative for itching and rash.  Neurological: Negative for dizziness, gait problem and headaches.  Hematological: Negative for adenopathy. Does not bruise/bleed easily.  Breast: Denies any new nodularity, masses, tenderness, nipple changes, or nipple discharge.      PAST MEDICAL/SURGICAL HISTORY:  Past Medical History:  Diagnosis Date  . Anemia   . Arthritis   . Bilateral edema of lower extremity   . 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 Chronic lower back pain   . CKD (chronic kidney disease) stage 3, GFR 30-59 ml/min   . DDD (degenerative disc disease)    lumbar  . Diverticulosis   . Family history of breast cancer   .  Family history of pancreatic cancer   . Family history of prostate cancer   . Flaccid neuropathic bladder, not elsewhere classified   . Foley catheter in place   . GERD (gastroesophageal reflux disease)   . Hemorrhoids   . Hiatal hernia   . History of colon polyps   . History of radiation therapy 09-12-2015 to 10-12-2015   42.72 gray in 16 fractions directed right breast w/ boost of 12 gray in 6 fractions directed at the lumpectomy cavity- Total dose: 54.72y  . History of recurrent UTIs   . Hyperlipidemia   . PONV (postoperative nausea and vomiting)   . RBBB (right bundle  branch block with left anterior fascicular block)   . Type 2 diabetes mellitus ([place])   . Urinary retention with incomplete bladder emptying   . Wears dentures    full upper and lower partial  . Wears glasses   . Wears hearing aid    bilateral but does not wear   Past Surgical History:  Procedure Laterality Date  . BREAST LUMPECTOMY WITH RADIOACTIVE SEED LOCALIZATION Right 08/03/2015   Procedure: BREAST LUMPECTOMY WITH RADIOACTIVE SEED LOCALIZATION;  Surgeon: [name], MD;  Location: [facility];  Service: General;  Laterality: Right;  . CARDIOVASCULAR STRESS TEST  01/14/2012   Low risk nuclear study w/ small mild apical and apical lateral reversible perfusion defect represents a small area of ischemia versus shifting breast artifact/  normal LV funciton and wall motion, ef 86%  . CARPAL TUNNEL RELEASE Right 1977  . CATARACT EXTRACTION W/ INTRAOCULAR LENS  IMPLANT, BILATERAL Bilateral left 1996/  right 1997  . CLOSED RIGHT KNEE MANIPULATION  08/11/2002   post TKA  . CYSTOSTOMY N/A 05/17/2016   Procedure: CYSTOSCOPY WITH  SUPRAPUBIC PLACMENT;  Surgeon: [name], MD;  Location: [facility];  Service: Urology;  Laterality: N/A;  . GANGLION CYST EXCISION Right 12/09/2001   right palm  . KNEE ARTHROSCOPY Right 12/14/2002   w/ Lysis Adhesions  . LUMBAR LAMINECTOMY/DECOMPRESSION MICRODISCECTOMY N/A 03/06/2016   Procedure: CENTRAL DECOMPRESSION L3 - L4 ,L4 - L5;  Surgeon: [name], MD;  Location[facility];  Service: Orthopedics;  Laterality: N/A;  . SHOULDER HEMI-ARTHROPLASTY Right 02/20/2009   avascular necrosis   . TOTAL KNEE ARTHROPLASTY Bilateral right 06-23-2002/  left 07-11-2008  . [place]  . VAGINAL HYSTERECTOMY  1975     ALLERGIES:  No Known Allergies   CURRENT MEDICATIONS:  Outpatient Encounter Prescriptions as of 11/05/2016  Medication Sig Note  . acetaminophen (TYLENOL) 500 MG tablet Take 500 mg by mouth every  6 (six) hours as needed for mild pain.   Marland Kitchen aspirin EC 81 MG tablet Take 81 mg by mouth daily.   Marland Kitchen atorvastatin (LIPITOR) 10 MG tablet Take 1 tablet (10 mg total) by mouth daily. (Patient taking differently: Take 10 mg by mouth every morning. )   . calcium carbonate (TUMS - DOSED IN MG ELEMENTAL CALCIUM) 500 MG chewable tablet Chew 1 tablet by mouth as needed for indigestion or heartburn.   . Calcium Citrate-Vitamin D (CALCIUM CITRATE +D PO) Take 630 mg by mouth 2 (two) times daily. Vit d3 is 500iu   . Cholecalciferol (VITAMIN D3) 2000 units TABS Take by mouth every morning.   . Cyanocobalamin (B-12 PO) Take 1 tablet by mouth daily. 09/12/2016: Taking now every other day  . esomeprazole (NEXIUM) 40 MG capsule Take 40 mg by mouth daily at 12 noon.   . furosemide (  LASIX) 40 MG tablet TAKE 1 TABLET DAILY FOR    EDEMA (Patient taking differently: Take 40 mg by mouth every morning. TAKE 1 TABLET DAILY FOR    EDEMA)   . gabapentin (NEURONTIN) 300 MG capsule TAKE 1 CAPSULE(300 MG) BY MOUTH AT BEDTIME   . glipiZIDE (GLUCOTROL XL) 5 MG 24 hr tablet Take 1 tablet (5 mg total) by mouth daily. (Patient taking differently: Take 5 mg by mouth daily with breakfast. )   . Potassium 99 MG TABS Take 1 tablet by mouth every morning.    No facility-administered encounter medications on file as of 11/05/2016.      ONCOLOGIC FAMILY HISTORY:  Family History  Problem Relation Age of Onset  . Pancreatic cancer Mother 27  . Diabetes Mother   . Prostate cancer Brother   . Diabetes Father   . Heart disease Father   . Diabetes Brother   . Heart attack Brother   . Diabetes Sister   . Diabetes Daughter   . Diabetes Son   . Coronary artery disease Brother     MI in his 50s  . Colon cancer Neg Hx   . Stomach cancer Neg Hx     GENETIC COUNSELING/TESTING: Negative, see above  SOCIAL HISTORY:  JAZAE GANDOLFI is married and lives in [place], [place].  She has 2 children.  Her son passed away, her daughter  lives in [place].  Ms. Mcphearson is currently retired.  She denies any current or history of tobacco, alcohol, or illicit drug use.     PHYSICAL EXAMINATION:  Vital Signs: Vitals:   11/05/16 1300  BP: (!) 125/52  Pulse: 88  Resp: 20  Temp: 97.8 F (36.6 C)   Filed Weights   11/05/16 1300  Weight: 155 lb 12.8 oz (70.7 kg)   General: Well-nourished, well-appearing female in no acute distress.  Unaccompanied today.   HEENT: Head is normocephalic.  Pupils equal and reactive to light. Conjunctivae clear without exudate.  Sclerae anicteric. Oral mucosa is pink, moist.  Oropharynx is pink without lesions or erythema.  Lymph: No cervical, supraclavicular, or infraclavicular lymphadenopathy noted on palpation.  Cardiovascular: Regular rate and rhythm.Marland Kitchen Respiratory: Clear to auscultation bilaterally. Chest expansion symmetric; breathing non-labored.  Breast Exam:  -Left breast: No appreciable masses on palpation. No skin redness, thickening, or peau d'orange appearance; no nipple retraction or nipple discharge;   -Right breast: No appreciable masses on palpation. No skin redness, thickening, or peau d'orange appearance; no nipple retraction or nipple discharge; mild distortion in symmetry at previous lumpectomy site well healed scar without erythema or nodularity. -Axilla: No axillary adenopathy bilaterally.  GI: Abdomen soft and round; non-tender, non-distended. Bowel sounds normoactive. No hepatosplenomegaly.   GU: Deferred.  Neuro: No focal deficits. Steady gait.  Psych: Mood and affect normal and appropriate for situation.  Extremities: No edema. Skin: Warm and dry.  LABORATORY DATA:  None for this visit   DIAGNOSTIC IMAGING:  Most recent mammogram:      ASSESSMENT AND PLAN:  Ms.. Padin is a pleasant 83 y.o. female with history of Stage IA right breast invasive ductal carcinoma, ER-/PR-/HER2-, diagnosed in 06/2015, treated with lumpectomy and adjuvant radiation therapy.  She  p[facility] to [facility] for surveillance and routine follow-up.   1. History of breast cancer:  Ms. Vanblarcom is currently clinically and radiographically without evidence of disease or recurrence of breast cancer. She will be due for mammogram in 08/2017.  I encouraged her to call me  with any questions or concerns before her next visit at the cancer center, and I would be happy to see her sooner, if needed.    2. Bone health:  Given Ms. Sporn's age, history of breast cancer, a she is at risk for bone demineralization. She is unsure of a recent bone density but thinks her PCP has been ordering and following this.  She was encouraged to increase her consumption of foods rich in calcium, as well as increase her weight-bearing activities.  She was given education on specific food and activities to promote bone health.  3. Cancer screening:  Due to Ms. Ciocca's history and her age, she should receive screening for skin cancers. She was encouraged to follow-up with her PCP for appropriate cancer screenings.   4. Health maintenance and wellness promotion: Ms. Crocker was encouraged to consume 5-7 servings of fruits and vegetables per day. She was also encouraged to engage in moderate to vigorous exercise for 30 minutes per day most days of the week. She was instructed to limit her alcohol consumption and continue to abstain from tobacco use.   Dispo:  -Return to cancer center in one year for LTS f/u   A total of (30) minutes of face-to-face time was spent with this patient with greater than 50% of that time in counseling and care-coordination.   Gardenia Phlegm, NP Survivorship Program [facility] [phone]   Note: PRIMARY CARE PROVIDER Lottie Dawson, [place] [phone]

## 2016-11-19 ENCOUNTER — Telehealth: Payer: Self-pay

## 2016-11-19 NOTE — Telephone Encounter (Signed)
Called pt to inform her that she will need to see Lindsey,NP in November 2018, and per discussion with Dr.Kinard, Dr.Wakefied and Lindsey,NP, okay to cancel Dr.Kinard appt at the end of the month and schedule pt with Dr.Wakefield next year ([id number]). Set up pt's November appt and confirmed time/date. Pt aware that she will no longer need to come in for Dr.Kinard appt end of May. No further questions at this time.

## 2016-12-05 ENCOUNTER — Ambulatory Visit: Payer: Self-pay | Admitting: Radiation Oncology

## 2016-12-05 ENCOUNTER — Ambulatory Visit
Admission: RE | Admit: 2016-12-05 | Discharge: 2016-12-05 | Disposition: A | Discharge status: Home / Self Care | Payer: Medicare Other | Source: Ambulatory Visit | Attending: Radiation Oncology | Admitting: Radiation Oncology

## 2016-12-05 ENCOUNTER — Telehealth: Payer: Self-pay | Admitting: Oncology

## 2016-12-05 NOTE — Telephone Encounter (Signed)
Called patient regarding her follow up appointment today with Dr. Sondra Come.  She said that Charlestine Massed, NP was supposed to cancel the appointment.  Verbalized understanding and advised her to call if she has any questions or concerns.

## 2016-12-11 DIAGNOSIS — N312 Flaccid neuropathic bladder, not elsewhere classified: Secondary | ICD-10-CM | POA: Diagnosis not present

## 2016-12-16 DIAGNOSIS — H40013 Open angle with borderline findings, low risk, bilateral: Secondary | ICD-10-CM | POA: Diagnosis not present

## 2016-12-16 DIAGNOSIS — H04123 Dry eye syndrome of bilateral lacrimal glands: Secondary | ICD-10-CM | POA: Diagnosis not present

## 2016-12-16 DIAGNOSIS — H1013 Acute atopic conjunctivitis, bilateral: Secondary | ICD-10-CM | POA: Diagnosis not present

## 2016-12-16 DIAGNOSIS — H02403 Unspecified ptosis of bilateral eyelids: Secondary | ICD-10-CM | POA: Diagnosis not present

## 2017-01-20 DIAGNOSIS — N312 Flaccid neuropathic bladder, not elsewhere classified: Secondary | ICD-10-CM | POA: Diagnosis not present

## 2017-02-24 ENCOUNTER — Encounter: Payer: Self-pay | Admitting: Internal Medicine

## 2017-02-24 ENCOUNTER — Ambulatory Visit (INDEPENDENT_AMBULATORY_CARE_PROVIDER_SITE_OTHER): Payer: Medicare Other | Admitting: Internal Medicine

## 2017-02-24 VITALS — BP 112/60 | HR 74 | Temp 97.9°F | Wt 157.6 lb

## 2017-02-24 DIAGNOSIS — E785 Hyperlipidemia, unspecified: Secondary | ICD-10-CM | POA: Diagnosis not present

## 2017-02-24 DIAGNOSIS — E119 Type 2 diabetes mellitus without complications: Secondary | ICD-10-CM

## 2017-02-24 DIAGNOSIS — N289 Disorder of kidney and ureter, unspecified: Secondary | ICD-10-CM | POA: Diagnosis not present

## 2017-02-24 DIAGNOSIS — Z79899 Other long term (current) drug therapy: Secondary | ICD-10-CM

## 2017-02-24 LAB — POCT GLYCOSYLATED HEMOGLOBIN (HGB A1C): Hemoglobin A1C: 7.2

## 2017-02-24 MED ORDER — ESOMEPRAZOLE MAGNESIUM 40 MG PO CPDR: 40.0000 mg | DELAYED_RELEASE_CAPSULE | Freq: Every day | ORAL | 1 refills | Status: DC

## 2017-02-24 MED ORDER — GLIPIZIDE ER 5 MG PO TB24: 5.0000 mg | ORAL_TABLET | Freq: Every day | ORAL | 1 refills | Status: DC

## 2017-02-24 MED ORDER — FUROSEMIDE 40 MG PO TABS: ORAL_TABLET | ORAL | 1 refills | Status: DC

## 2017-02-24 MED ORDER — ATORVASTATIN CALCIUM 10 MG PO TABS: 10.0000 mg | ORAL_TABLET | Freq: Every day | ORAL | 1 refills | Status: DC

## 2017-02-24 NOTE — Progress Notes (Signed)
Chief Complaint  Patient presents with  . Follow-up    HPI: [name] 83 y.o. come in for Chronic disease management   Donated blood  Recently   Hg 12.9 taking iron some  Feels fine not checking bg much no low bgf  Taking 1/2 glipizide 2.5  Most days  And ocass 5 mg   Eats regular  Meals and  Fruit snacks   Never had problem with meds  On atorva  Lasix and  nexium  ROS: See pertinent positives and negatives per HPI.  Past Medical History:  Diagnosis Date  . Anemia   . Arthritis   . Bilateral edema of lower extremity   . 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 Chronic lower back pain   . CKD (chronic kidney disease) stage 3, GFR 30-59 ml/min   . DDD (degenerative disc disease)    lumbar  . Diverticulosis   . Family history of breast cancer   . Family history of pancreatic cancer   . Family history of prostate cancer   . Flaccid neuropathic bladder, not elsewhere classified   . Foley catheter in place   . GERD (gastroesophageal reflux disease)   . Hemorrhoids   . Hiatal hernia   . History of colon polyps   . History of radiation therapy 09-12-2015 to 10-12-2015   42.72 gray in 16 fractions directed right breast w/ boost of 12 gray in 6 fractions directed at the lumpectomy cavity- Total dose: 54.72y  . History of recurrent UTIs   . Hyperlipidemia   . PONV (postoperative nausea and vomiting)   . RBBB (right bundle branch block with left anterior fascicular block)   . Type 2 diabetes mellitus ([place])   . Urinary retention with incomplete bladder emptying   . Wears dentures    full upper and lower partial  . Wears glasses   . Wears hearing aid    bilateral but does not wear    Family History  Problem Relation Age of Onset  . Pancreatic cancer Mother 16    . Diabetes Mother   . Prostate cancer Brother   . Diabetes Father   . Heart disease Father   . Diabetes Brother   . Heart attack Brother   . Diabetes Sister   . Diabetes Daughter   . Diabetes Son   . Coronary artery disease Brother        MI in his 29s  . Colon cancer Neg Hx   . Stomach cancer Neg Hx     Social History   Social History  . Marital status: Married    Spouse name: N/A  . Number of children: 2  . Years of education: N/A   Occupational History  . Retired    Social History Main Topics  . Smoking status: Never Smoker  . Smokeless tobacco: Never Used  . Alcohol use No  . Drug use: No  . Sexual activity: No   Other Topics Concern  . None   Social History Narrative   HH 2 married   Works for [employer] for 40 years retired   No pets    Lives w spouse; married in 72; 57 years     Outpatient Medications Prior to Visit  Medication Sig Dispense Refill  .  acetaminophen (TYLENOL) 500 MG tablet Take 500 mg by mouth every 6 (six) hours as needed for mild pain.    Marland Kitchen aspirin EC 81 MG tablet Take 81 mg by mouth daily.    Marland Kitchen atorvastatin (LIPITOR) 10 MG tablet Take 1 tablet (10 mg total) by mouth daily. (Patient taking differently: Take 10 mg by mouth every morning. ) 90 tablet 3  . Calcium Citrate-Vitamin D (CALCIUM CITRATE +D PO) Take 630 mg by mouth 2 (two) times daily. Vit d3 is 500iu    . Cholecalciferol (VITAMIN D3) 2000 units TABS Take by mouth every morning.    . Cyanocobalamin (B-12 PO) Take 1 tablet by mouth daily.    Marland Kitchen esomeprazole (NEXIUM) 40 MG capsule Take 40 mg by mouth daily at 12 noon.    . furosemide (LASIX) 40 MG tablet TAKE 1 TABLET DAILY FOR    EDEMA (Patient taking differently: Take 40 mg by mouth every morning. TAKE 1 TABLET DAILY FOR    EDEMA) 90 tablet 3  . gabapentin (NEURONTIN) 300 MG capsule TAKE 1 CAPSULE(300 MG) BY MOUTH AT BEDTIME 90 capsule 2  . glipiZIDE (GLUCOTROL XL) 5 MG 24 hr tablet Take 1 tablet (5 mg total) by mouth daily.  (Patient taking differently: Take 5 mg by mouth daily with breakfast. ) 90 tablet 3  . ONETOUCH VERIO test strip     . Potassium 99 MG TABS Take 1 tablet by mouth every morning.    . calcium carbonate (TUMS - DOSED IN MG ELEMENTAL CALCIUM) 500 MG chewable tablet Chew 1 tablet by mouth as needed for indigestion or heartburn.    Marland Kitchen OVER THE COUNTER MEDICATION Magnesium OTC nightly for sleep     No facility-administered medications prior to visit.      EXAM:  BP 112/60 (BP Location: Right Arm, Patient Position: Sitting, Cuff Size: Normal)   Pulse 74   Temp 97.9 F (36.6 C) (Oral)   Wt 157 lb 9.6 oz (71.5 kg)   BMI 29.78 kg/m   Body mass index is 29.78 kg/m.  GENERAL: vitals reviewed and listed above, alert, oriented, appears well hydrated and in no acute distress HEENT: atraumatic, conjunctiva  clear, no obvious abnormalities on inspection of external nose and ears NECK: no obvious masses on inspection palpation  LUNGS: clear to auscultation bilaterally, no wheezes, rales or rhonchi, good air movement CV: HRRR, no clubbing cyanosis or  peripheral edema nl cap refill  MS: moves all extremities  djd chhanges  Feet no callous or lesion without noticeable focal  abnormality PSYCH: pleasant and cooperative, no obvious depression or anxiety Mobility: up on table independently  Lab Results  Component Value Date   WBC 4.2 08/20/2016   HGB 12.0 08/20/2016   HCT 36.3 08/20/2016   PLT 198.0 08/20/2016   GLUCOSE 148 (H) 08/20/2016   CHOL 160 08/20/2016   TRIG 214.0 (H) 08/20/2016   HDL 41.20 08/20/2016   LDLDIRECT 75.0 08/20/2016   LDLCALC 83 12/16/2013   ALT 15 02/27/2016   AST 18 02/27/2016   NA 142 08/20/2016   K 4.2 08/20/2016   CL 109 08/20/2016   CREATININE 1.12 08/20/2016   BUN 21 08/20/2016   CO2 30 08/20/2016   TSH 2.06 05/01/2015   INR 1.01 02/27/2016   HGBA1C 7.2 02/24/2017   MICROALBUR 1.3 09/12/2016   BP Readings from Last 3 Encounters:  02/24/17 112/60  11/05/16  (!) 125/52  09/12/16 116/68   Wt Readings from Last 3 Encounters:  02/24/17 157  lb 9.6 oz (71.5 kg)  11/05/16 155 lb 12.8 oz (70.7 kg)  09/12/16 158 lb 4 oz (71.8 kg)    ASSESSMENT AND PLAN:  Discussed the following assessment and plan:  Controlled type 2 diabetes mellitus without complication, without long-term current use of insulin ([place]) - Plan: POCT glycosylated hemoglobin (Hb A1C)  Medication management reviewed    Risk benefit of medication discussed.  Benefit more than risk of medications  to continue. At 2.5 mg and stop at any sings of a problem  Will readdress at her  Yearly visit in 6 months   See if other meds used in past   -Patient advised to return or notify health care team  if  new concerns arise.  Patient Instructions  Glad you are doing well. .  Let us know if any signs of low blood sugar  .  Will work on  sending in refill glipizide  To her  Mail order  Would like you to be able  Check BG to make sure not getting any low blood sugars.    Yearly  visit in 6 months .       Standley Brooking. Teshawn Moan M.D. Record reviewed   Prev care Dr Joni Fears and was on glipizide 5 since  Then in 2011  consideration of descalation of med 1/2 of 2.5?  or even switching to metformin if   GFR is good enough

## 2017-02-24 NOTE — Patient Instructions (Addendum)
Glad you are doing well. .  Let us know if any signs of low blood sugar  .  Will work on  sending in refill glipizide  To her  Mail order  Would like you to be able  Check BG to make sure not getting any low blood sugars.    Yearly  visit in 6 months .

## 2017-02-24 NOTE — Addendum Note (Signed)
Addended by: Milford Cage on: 02/24/2017 12:53 PM   Modules accepted: Orders

## 2017-03-03 DIAGNOSIS — N312 Flaccid neuropathic bladder, not elsewhere classified: Secondary | ICD-10-CM | POA: Diagnosis not present

## 2017-03-07 ENCOUNTER — Ambulatory Visit (INDEPENDENT_AMBULATORY_CARE_PROVIDER_SITE_OTHER): Payer: Medicare Other | Admitting: Internal Medicine

## 2017-03-07 ENCOUNTER — Encounter: Payer: Self-pay | Admitting: Internal Medicine

## 2017-03-07 VITALS — BP 130/58 | HR 85 | Temp 98.0°F | Wt 155.2 lb

## 2017-03-07 DIAGNOSIS — R768 Other specified abnormal immunological findings in serum: Secondary | ICD-10-CM

## 2017-03-07 LAB — HEPATIC FUNCTION PANEL
ALT: 10 U/L (ref 0–35)
AST: 13 U/L (ref 0–37)
Albumin: 4.2 g/dL (ref 3.5–5.2)
Alkaline Phosphatase: 83 U/L (ref 39–117)
Bilirubin, Direct: 0.1 mg/dL (ref 0.0–0.3)
Total Bilirubin: 0.5 mg/dL (ref 0.2–1.2)
Total Protein: 6.7 g/dL (ref 6.0–8.3)

## 2017-03-07 NOTE — Progress Notes (Signed)
Chief Complaint  Patient presents with  . Follow-up    HPI: [name] 83 y.o.  Comes in today because she got a letter from the [employer] when she donated blood at stated that she should see her primary doctor. She brings in the paperwork shows a positive hepatitis B core antibody with a negative PCR and negative antigen screen. The rest of her tests including hep C HIV seek and others are negative. She does have a history of a transfusion related to one of her surgeries but they are were all she believes after 1990. No other high risk body fluids issue.No history of jaundice or hepatitis. ROS: See pertinent positives and negatives per HPI.  Past Medical History:  Diagnosis Date  . Anemia   . Arthritis   . Bilateral edema of lower extremity   . 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 Chronic lower back pain   . CKD (chronic kidney disease) stage 3, GFR 30-59 ml/min   . DDD (degenerative disc disease)    lumbar  . Diverticulosis   . Family history of breast cancer   . Family history of pancreatic cancer   . Family history of prostate cancer   . Flaccid neuropathic bladder, not elsewhere classified   . Foley catheter in place   . GERD (gastroesophageal reflux disease)   . Hemorrhoids   . Hiatal hernia   . History of colon polyps   . History of radiation therapy 09-12-2015 to 10-12-2015   42.72 gray in 16 fractions directed right breast w/ boost of 12 gray in 6 fractions directed at the lumpectomy cavity- Total dose: 54.74y  . History of recurrent UTIs   . Hyperlipidemia   . PONV (postoperative nausea and vomiting)   . RBBB (right bundle branch block with left anterior fascicular block)   . Type 2 diabetes mellitus ([place])   . Urinary retention with incomplete  bladder emptying   . Wears dentures    full upper and lower partial  . Wears glasses   . Wears hearing aid    bilateral but does not wear    Family History  Problem Relation Age of Onset  . Pancreatic cancer Mother 78  . Diabetes Mother   . Prostate cancer Brother   . Diabetes Father   . Heart disease Father   . Diabetes Brother   . Heart attack Brother   . Diabetes Sister   . Diabetes Daughter   . Diabetes Son   . Coronary artery disease Brother        MI in his 87s  . Colon cancer Neg Hx   . Stomach cancer Neg Hx     Social History   Social History  . Marital status: Married    Spouse name: N/A  . Number of children: 2  . Years of education: N/A   Occupational History  . Retired    Social History Main Topics  . Smoking status: Never Smoker  . Smokeless tobacco: Never Used  . Alcohol use No  . Drug use: No  . Sexual activity: No   Other Topics Concern  . None   Social History Narrative   HH 2 married   Works for [employer] for 40 years retired   No pets  Lives w spouse; married in 40; 53 years     Outpatient Medications Prior to Visit  Medication Sig Dispense Refill  . acetaminophen (TYLENOL) 500 MG tablet Take 500 mg by mouth every 6 (six) hours as needed for mild pain.    Marland Kitchen aspirin EC 81 MG tablet Take 81 mg by mouth daily.    Marland Kitchen atorvastatin (LIPITOR) 10 MG tablet Take 1 tablet (10 mg total) by mouth daily. 90 tablet 1  . Calcium Citrate-Vitamin D (CALCIUM CITRATE +D PO) Take 630 mg by mouth 2 (two) times daily. Vit d3 is 500iu    . Cholecalciferol (VITAMIN D3) 2000 units TABS Take by mouth every morning.    . Cyanocobalamin (B-12 PO) Take 1 tablet by mouth daily.    Marland Kitchen esomeprazole (NEXIUM) 40 MG capsule Take 1 capsule (40 mg total) by mouth daily at 12 noon. 90 capsule 1  . furosemide (LASIX) 40 MG tablet TAKE 1 TABLET DAILY FOR    EDEMA 90 tablet 1  . gabapentin (NEURONTIN) 300 MG capsule TAKE 1 CAPSULE(300 MG) BY MOUTH AT BEDTIME 90 capsule 2    . glipiZIDE (GLUCOTROL XL) 5 MG 24 hr tablet Take 1 tablet (5 mg total) by mouth daily. 90 tablet 1  . ONETOUCH VERIO test strip     . Potassium 99 MG TABS Take 1 tablet by mouth every morning.     No facility-administered medications prior to visit.      EXAM:  BP (!) 130/58 (BP Location: Right Arm, Patient Position: Sitting, Cuff Size: Normal)   Pulse 85   Temp 98 F (36.7 C) (Oral)   Wt 155 lb 3.2 oz (70.4 kg)   BMI 29.32 kg/m   Body mass index is 29.32 kg/m.  GENERAL: vitals reviewed and listed above, alert, oriented, appears well hydrated and in no acute distress HEENT: atraumatic, conjunctiva  clear, no obvious abnormalities on inspection of external nose and ears PSYCH: pleasant and cooperative, no obvious depression or anxiety  Lab Results  Component Value Date   WBC 4.2 08/20/2016   HGB 12.0 08/20/2016   HCT 36.3 08/20/2016   PLT 198.0 08/20/2016   GLUCOSE 148 (H) 08/20/2016   CHOL 160 08/20/2016   TRIG 214.0 (H) 08/20/2016   HDL 41.20 08/20/2016   LDLDIRECT 75.0 08/20/2016   LDLCALC 83 12/16/2013   ALT 15 02/27/2016   AST 18 02/27/2016   NA 142 08/20/2016   K 4.2 08/20/2016   CL 109 08/20/2016   CREATININE 1.12 08/20/2016   BUN 21 08/20/2016   CO2 30 08/20/2016   TSH 2.06 05/01/2015   INR 1.01 02/27/2016   HGBA1C 7.2 02/24/2017   MICROALBUR 1.3 09/12/2016    ASSESSMENT AND PLAN:  Discussed the following assessment and plan:  Hepatitis B core antibody positive - Plan: Hepatitis B e antibody, Hepatitis B DNA, ultraquantitative, PCR, Hepatitis B core antibody, total, Hepatitis B e antigen, Hepatitis B surface antibody, Hepatitis B surface antigen, Hepatitis B core antibody, IgM, Hepatic function panel Reports reviewed copied and sent to scan. Discussed with patient this could be a false positive core antibody or she could've had the disease years ago and has since resolved it to no untoward consequence We'll repeat labsget antigen antibody core DNA  PCR etc. Repeat her liver panel Then we'll get her results and make a plan. She is not high risk other than her "modern transfusions." -Patient advised to return or notify health care team  if symptoms worsen ,persist or new concerns  arise. Total visit 3mns > 50% spent counseling and coordinating care as indicated in above note and in instructions to patient .   Patient Instructions  I think that this could either be a false positive test for evidence that you might of had hepatitis B way  in the past in our meeting to it now. Either way this is reassuring and will check your liver function tests today. We'll let you know results when we get all of them back it may take longer than typical lab work.     WStandley Brooking Modesta Sammons M.D.

## 2017-03-07 NOTE — Patient Instructions (Addendum)
I think that this could either be a false positive test for evidence that you might of had hepatitis B way  in the past in our meeting to it now. Either way this is reassuring and will check your liver function tests today. We'll let you know results when we get all of them back it may take longer than typical lab work.

## 2017-03-08 LAB — HEPATITIS B SURFACE ANTIBODY,QUALITATIVE: Hep B S Ab: REACTIVE — AB

## 2017-03-08 LAB — HEPATITIS B SURFACE ANTIGEN: Hepatitis B Surface Ag: NONREACTIVE

## 2017-03-08 LAB — HEPATITIS B CORE ANTIBODY, IGM: Hep B C IgM: NONREACTIVE

## 2017-03-08 LAB — HEPATITIS B CORE ANTIBODY, TOTAL: Hep B Core Total Ab: REACTIVE — AB

## 2017-03-10 LAB — HEPATITIS B DNA, ULTRAQUANTITATIVE, PCR
Hepatitis B DNA (Calc): <1 Log IU/mL
Hepatitis B DNA: <10 IU/mL

## 2017-03-11 LAB — HEPATITIS B E ANTIBODY: Hepatitis Be Antibody: NONREACTIVE

## 2017-03-11 LAB — HEPATITIS B E ANTIGEN: Hepatitis Be Antigen: NONREACTIVE

## 2017-04-14 DIAGNOSIS — N312 Flaccid neuropathic bladder, not elsewhere classified: Secondary | ICD-10-CM | POA: Diagnosis not present

## 2017-04-23 DIAGNOSIS — L853 Xerosis cutis: Secondary | ICD-10-CM | POA: Diagnosis not present

## 2017-04-23 DIAGNOSIS — L570 Actinic keratosis: Secondary | ICD-10-CM | POA: Diagnosis not present

## 2017-04-29 ENCOUNTER — Ambulatory Visit (INDEPENDENT_AMBULATORY_CARE_PROVIDER_SITE_OTHER): Payer: Medicare Other

## 2017-04-29 DIAGNOSIS — Z23 Encounter for immunization: Secondary | ICD-10-CM | POA: Diagnosis not present

## 2017-05-12 ENCOUNTER — Telehealth: Payer: Self-pay | Admitting: Adult Health

## 2017-05-12 ENCOUNTER — Ambulatory Visit (HOSPITAL_BASED_OUTPATIENT_CLINIC_OR_DEPARTMENT_OTHER): Payer: Medicare Other | Admitting: Adult Health

## 2017-05-12 ENCOUNTER — Encounter: Payer: Self-pay | Admitting: Adult Health

## 2017-05-12 VITALS — BP 108/59 | HR 98 | Temp 98.3°F | Resp 18 | Ht 61.0 in | Wt 158.9 lb

## 2017-05-12 DIAGNOSIS — C50411 Malignant neoplasm of upper-outer quadrant of right female breast: Secondary | ICD-10-CM

## 2017-05-12 DIAGNOSIS — Z853 Personal history of malignant neoplasm of breast: Secondary | ICD-10-CM

## 2017-05-12 DIAGNOSIS — Z171 Estrogen receptor negative status [ER-]: Secondary | ICD-10-CM

## 2017-05-12 NOTE — Telephone Encounter (Signed)
Gave patient avs and calendar with appt per 11/5 los.

## 2017-05-12 NOTE — Progress Notes (Signed)
CLINIC:  Survivorship   REASON FOR VISIT:  Routine follow-up for history of breast cancer.   BRIEF ONCOLOGIC HISTORY:  Oncology History         Breast cancer of upper-outer quadrant of right female breast ([place])   06/27/2015 Mammogram    New oval focal asymmetry in right breast, posterior depth superior region      07/19/2015 Initial Diagnosis    Right breast biopsy: Invasive ductal carcinoma grade 2-3, PR 0%, PR 0%, Ki-67 20%, HER-2 negative ratio 1.77; 5 mm abnormality at 10:00 position      07/19/2015 Clinical Stage    Stage IA: T1b N0      08/02/2015 Procedure    Breast/Ovarian panel (geneDx) no deleterious mutations at ATM, BARD1, BRCA1, BRCA2, BRIP1, CDH1, CHEK2, EPCAM, FANCC, MLH1, MSH2, MSH6, NBN, PALB2, PMS2, PTEN, RAD51C, RAD51D, TP53, and XRCC2      08/03/2015 Surgery    Rt. Lumpectomy: IDC, 1.2 cm, Grade 3, with DCIS (in situ margin 0.1 cm), LN not taken, Er 0%, PR 0%, Her 2 Neg equivocal on FISH (ratio 1.93),  Negative for HER2 on IHC.      08/03/2015 Pathologic Stage    Stage IA: T1c Nx      09/13/2015 - 10/12/2015 Radiation Therapy    Adjuvant radiation therapy: 42.72 gray in 16 fractions directed right breast with a boost of 12 gray in 6 fractions directed at the lumpectomy cavity. Total dose: 54.72 Gy      12/21/2015 Survivorship    SCP visit completed        INTERVAL HISTORY:  Ms. Cryderman presents to the [facility] today for routine follow-up for her history of breast cancer.  Overall, she reports feeling quite well. She denies any new symptoms today with her breasts or the rest of her body.  She sees Dr. Regis Bill about every 6 months.  Her health continues to be stable.  She is not exercising.  She did have a back surgery after her breast surgery which made it difficult to exercise, but she has a treadmill and is planning on doing so.  She is up to date with her health maintenance.    REVIEW OF SYSTEMS:  Review of Systems  Constitutional:  Negative for appetite change, chills, fatigue, fever and unexpected weight change.  HENT:   Negative for hearing loss and lump/mass.   Eyes: Negative for eye problems and icterus.  Respiratory: Negative for chest tightness, cough and shortness of breath.   Cardiovascular: Negative for chest pain, leg swelling and palpitations.  Gastrointestinal: Negative for abdominal distention, abdominal pain, constipation, diarrhea, nausea and vomiting.  Endocrine: Negative for hot flashes.  Skin: Negative for itching and rash.  Neurological: Negative for dizziness, extremity weakness, headaches and numbness.  Hematological: Negative for adenopathy. Does not bruise/bleed easily.  Psychiatric/Behavioral: Negative for depression. The patient is not nervous/anxious.   Breast: Denies any new nodularity, masses, tenderness, nipple changes, or nipple discharge.       PAST MEDICAL/SURGICAL HISTORY:  Past Medical History:  Diagnosis Date  . Anemia   . Arthritis   . Bilateral edema of lower extremity   . 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 Chronic lower back pain   . CKD (chronic kidney disease) stage 3, GFR 30-59 ml/min   .  DDD (degenerative disc disease)    lumbar  . Diverticulosis   . Family history of breast cancer   . Family history of pancreatic cancer   . Family history of prostate cancer   . Flaccid neuropathic bladder, not elsewhere classified   . Foley catheter in place   . GERD (gastroesophageal reflux disease)   . Hemorrhoids   . Hiatal hernia   . History of colon polyps   . History of radiation therapy 09-12-2015 to 10-12-2015   42.72 gray in 16 fractions directed right breast w/ boost of 12 gray in 6 fractions directed at the lumpectomy cavity- Total dose: 54.72y  .  History of recurrent UTIs   . Hyperlipidemia   . PONV (postoperative nausea and vomiting)   . RBBB (right bundle branch block with left anterior fascicular block)   . Type 2 diabetes mellitus ([place])   . Urinary retention with incomplete bladder emptying   . Wears dentures    full upper and lower partial  . Wears glasses   . Wears hearing aid    bilateral but does not wear   Past Surgical History:  Procedure Laterality Date  . CARDIOVASCULAR STRESS TEST  01/14/2012   Low risk nuclear study w/ small mild apical and apical lateral reversible perfusion defect represents a small area of ischemia versus shifting breast artifact/  normal LV funciton and wall motion, ef 86%  . CARPAL TUNNEL RELEASE Right 1977  . CATARACT EXTRACTION W/ INTRAOCULAR LENS  IMPLANT, BILATERAL Bilateral left 1996/  right 1997  . CLOSED RIGHT KNEE MANIPULATION  08/11/2002   post TKA  . GANGLION CYST EXCISION Right 12/09/2001   right palm  . KNEE ARTHROSCOPY Right 12/14/2002   w/ Lysis Adhesions  . SHOULDER HEMI-ARTHROPLASTY Right 02/20/2009   avascular necrosis   . TOTAL KNEE ARTHROPLASTY Bilateral right 06-23-2002/  left 07-11-2008  . [place]  . VAGINAL HYSTERECTOMY  1975     ALLERGIES:  No Known Allergies   CURRENT MEDICATIONS:  Outpatient Encounter Medications as of 05/12/2017  Medication Sig Note  . acetaminophen (TYLENOL) 500 MG tablet Take 500 mg by mouth every 6 (six) hours as needed for mild pain.   Marland Kitchen aspirin EC 81 MG tablet Take 81 mg by mouth daily.   Marland Kitchen atorvastatin (LIPITOR) 10 MG tablet Take 1 tablet (10 mg total) by mouth daily.   . Calcium Citrate-Vitamin D (CALCIUM CITRATE +D PO) Take 630 mg by mouth 2 (two) times daily. Vit d3 is 500iu   . Cholecalciferol (VITAMIN D3) 2000 units TABS Take by mouth every morning.   . Cyanocobalamin (B-12 PO) Take 1 tablet by mouth daily. 09/12/2016: Taking now every other day  . esomeprazole (NEXIUM) 40 MG capsule Take 1 capsule (40 mg  total) by mouth daily at 12 noon. (Patient taking differently: Take 40 mg every other day by mouth. )   . furosemide (LASIX) 40 MG tablet TAKE 1 TABLET DAILY FOR    EDEMA (Patient taking differently: TAKE 1 TABLET DAILY FOR    EDEMA)   . gabapentin (NEURONTIN) 300 MG capsule TAKE 1 CAPSULE(300 MG) BY MOUTH AT BEDTIME   . glipiZIDE (GLUCOTROL XL) 5 MG 24 hr tablet Take 1 tablet (5 mg total) by mouth daily.   Glory Rosebush VERIO test strip    . Potassium 99 MG TABS Take 1 tablet by mouth every morning.    No facility-administered encounter medications on file as of 05/12/2017.  ONCOLOGIC FAMILY HISTORY:  Family History  Problem Relation Age of Onset  . Pancreatic cancer Mother 13  . Diabetes Mother   . Prostate cancer Brother   . Diabetes Father   . Heart disease Father   . Diabetes Brother   . Heart attack Brother   . Diabetes Sister   . Diabetes Daughter   . Diabetes Son   . Coronary artery disease Brother        MI in his 71s  . Colon cancer Neg Hx   . Stomach cancer Neg Hx     GENETIC COUNSELING/TESTING: See above  SOCIAL HISTORY:  RICKY DOAN is married and lives with her husband in [place], [place].  She has 1 daughter who lives in [place], [place].  Ms. Iglesia is currently retired.  She denies any current or history of tobacco, alcohol, or illicit drug use.     PHYSICAL EXAMINATION:  Vital Signs: Vitals:   05/12/17 1138  BP: (!) 108/59  Pulse: 98  Resp: 18  Temp: 98.3 F (36.8 C)  SpO2: 96%   Filed Weights   05/12/17 1138  Weight: 158 lb 14.4 oz (72.1 kg)   General: Well-nourished, well-appearing female in no acute distress.  Unaccompanied today.   HEENT: Head is normocephalic.  Pupils equal and reactive to light. Conjunctivae clear without exudate.  Sclerae anicteric. Oral mucosa is pink, moist.  Oropharynx is pink without lesions or erythema.  Lymph: No cervical, supraclavicular, or infraclavicular lymphadenopathy noted on palpation.    Cardiovascular: Regular rate and rhythm.Marland Kitchen Respiratory: Clear to auscultation bilaterally. Chest expansion symmetric; breathing non-labored.  Breast Exam:  -Left breast: No appreciable masses on palpation. No skin redness, thickening, or peau d'orange appearance; no nipple retraction or nipple discharge;  -Right breast: No appreciable masses on palpation. No skin redness, thickening, or peau d'orange appearance; no nipple retraction or nipple discharge; mild distortion in symmetry at previous lumpectomy site well healed scar without erythema or nodularity. -Axilla: No axillary adenopathy bilaterally.  GI: Abdomen soft and round; non-tender, non-distended. Bowel sounds normoactive. No hepatosplenomegaly.   GU: Deferred.  Neuro: No focal deficits. Steady gait.  Psych: Mood and affect normal and appropriate for situation.  MSK: No focal spinal tenderness to palpation, full range of motion in bilateral upper extremities Extremities: No edema. Skin: Warm and dry.  LABORATORY DATA:  None for this visit   DIAGNOSTIC IMAGING:  Most recent mammogram:  09/03/2016, normal   ASSESSMENT AND PLAN:  Ms.. Brendel is a pleasant 83 y.o. female with history of Stage IA right breast invasive ductal carcinoma, ER-/PR-/HER2-, diagnosed in 07/2015, treated with lumpectomy, adjuvant radiation therapy.  She presents to the Survivorship Clinic for surveillance and routine follow-up.   1. History of breast cancer:  Ms. Currie is currently clinically and radiographically without evidence of disease or recurrence of breast cancer. She will be due for mammogram in 08/2017; orders placed today.  She will f/u with Dr. Donne Hazel in 6 months.  I encouraged her to call me with any questions or concerns before her next visit at the cancer center, and I would be happy to see her sooner, if needed.    2. Bone health:  Given Ms. Bradstreet's age, history of breast cancer, she is at risk for bone demineralization. Her last DEXA scan  was "years ago" and she says the results are okay.  She says she doesn't want repeat testing done.  I will defer to her PCP rebgarding future bone density testing  and management.  In the meantime, she was encouraged to increase her consumption of foods rich in calcium, as well as increase her weight-bearing activities.  She was given education on specific food and activities to promote bone health.  3. Cancer screening:  Due to Ms. Fuertes's history and her age, she should receive screening for skin cancers. She was encouraged to follow-up with her PCP for appropriate cancer screenings.   4. Health maintenance and wellness promotion: Ms. Flicker was encouraged to consume 5-7 servings of fruits and vegetables per day. She was also encouraged to engage in moderate to vigorous exercise for 30 minutes per day most days of the week. She was instructed to limit her alcohol consumption and continue to abstain from tobacco use.  Dispo:  -Return to cancer center in one year with LTS follow up -F/u with Dr. Donne Hazel in 6 months -Mammogram 08/2017   A total of (30) minutes of face-to-face time was spent with this patient with greater than 50% of that time in counseling and care-coordination.   Gardenia Phlegm, NP Survivorship Program [facility] [phone]   Note: PRIMARY CARE PROVIDER Burnis Medin, [place] [phone]

## 2017-05-26 DIAGNOSIS — R3914 Feeling of incomplete bladder emptying: Secondary | ICD-10-CM | POA: Diagnosis not present

## 2017-06-23 DIAGNOSIS — H40013 Open angle with borderline findings, low risk, bilateral: Secondary | ICD-10-CM | POA: Diagnosis not present

## 2017-06-23 DIAGNOSIS — E119 Type 2 diabetes mellitus without complications: Secondary | ICD-10-CM | POA: Diagnosis not present

## 2017-06-23 DIAGNOSIS — H35363 Drusen (degenerative) of macula, bilateral: Secondary | ICD-10-CM | POA: Diagnosis not present

## 2017-06-23 DIAGNOSIS — H35033 Hypertensive retinopathy, bilateral: Secondary | ICD-10-CM | POA: Diagnosis not present

## 2017-06-23 LAB — HM DIABETES EYE EXAM

## 2017-07-07 DIAGNOSIS — N312 Flaccid neuropathic bladder, not elsewhere classified: Secondary | ICD-10-CM | POA: Diagnosis not present

## 2017-07-11 ENCOUNTER — Other Ambulatory Visit: Payer: Self-pay | Admitting: Internal Medicine

## 2017-07-14 ENCOUNTER — Encounter: Payer: Self-pay | Admitting: Internal Medicine

## 2017-08-09 ENCOUNTER — Other Ambulatory Visit: Payer: Self-pay | Admitting: Internal Medicine

## 2017-08-09 DIAGNOSIS — N289 Disorder of kidney and ureter, unspecified: Secondary | ICD-10-CM

## 2017-08-18 DIAGNOSIS — N312 Flaccid neuropathic bladder, not elsewhere classified: Secondary | ICD-10-CM | POA: Diagnosis not present

## 2017-08-20 ENCOUNTER — Other Ambulatory Visit: Payer: Self-pay | Admitting: Internal Medicine

## 2017-08-20 DIAGNOSIS — E785 Hyperlipidemia, unspecified: Secondary | ICD-10-CM

## 2017-08-20 DIAGNOSIS — E119 Type 2 diabetes mellitus without complications: Secondary | ICD-10-CM

## 2017-08-20 MED ORDER — ATORVASTATIN CALCIUM 10 MG PO TABS: 10.0000 mg | ORAL_TABLET | Freq: Every day | ORAL | 1 refills | Status: DC

## 2017-08-20 MED ORDER — GLIPIZIDE ER 5 MG PO TB24: 5.0000 mg | ORAL_TABLET | Freq: Every day | ORAL | 1 refills | Status: DC

## 2017-08-20 NOTE — Telephone Encounter (Signed)
Copied from [place]. Topic: Quick Communication - Rx Refill/Question >> Aug 20, 2017 11:55 AM Synthia Innocent wrote: Medication: glipiZIDE (GLUCOTROL XL) 5 MG 24 hr tablet and atorvastatin (LIPITOR) 10 MG tablet    Has the patient contacted their pharmacy? Yes.     (Agent: If no, request that the patient contact the pharmacy for the refill.)   Preferred Pharmacy (with phone number or street name): [facility]   Agent: Please be advised that RX refills may take up to 3 business days. We ask that you follow-up with your pharmacy.  REF # [id number]

## 2017-08-25 NOTE — Progress Notes (Signed)
Chief Complaint  Patient presents with  . Annual Exam    no new concerns, pt needs new glucometer    HPI: [name] 84 y.o. comes in today for yearly visit and monitoring  visit .Since last visit.  GN:OIBBC meter neesd new one no change  Vision and  Any neuro sx    Right hand arthritis :very painful and hard to use right thumb.  hemorrhooids  On going. Had seen igi ibn past but they still bleed and irritate told to use topical  Tucks  Other  As discussed  No abd pain some blood with bm     No vision change neuro sx .  See sepcialist  Son about hx of   r breast cancer   Hs uro catheter and changes every  6 weeks but never sees urologist     Health Maintenance  Topic Date Due  . FOOT EXAM  04/26/2017  . DEXA SCAN  09/05/2028 (Originally 02/20/1997)  . HEMOGLOBIN A1C  08/27/2017  . URINE MICROALBUMIN  09/12/2017  . OPHTHALMOLOGY EXAM  06/23/2018  . TETANUS/TDAP  01/12/2023  . INFLUENZA VACCINE  Completed  . PNA vac Low Risk Adult  Completed   Health Maintenance Review LIFESTYLE:  Exercise:  adls    Uses cart to keep balance  .  Tobacco/ETS:no Alcohol: no Sugar beverages:no Sleep:   6 hours Drug use: no HH: 2 no pets    Hearing:  Some decrease    Has heairng aids not that great.   Vision:  No limitations at present . Last eye check UTD glasses .   Safety:  Has smoke detector and wears seat belts.  No firearms. No excess sun exposure. Sees dentist regularly.  Falls: no  Memory: Felt to be good  , no concern from her or her family.  Depression: No anhedonia unusual crying or depressive symptoms  Nutrition: Eats well balanced diet; adequate calcium and vitamin D. No swallowing chewing problems.  Injury: no major injuries in the last six months.  Other healthcare providers:  Reviewed today .  Social:  Lives with spouse married. No pets.   Preventive parameters: up-to-date  Reviewed   ADLS:   There are no problems or need for assistance  driving,  feeding, obtaining food, dressing, toileting and bathing, managing money using phone. She is independent.   ROS:  See hpi  GEN/ HEENT: No fever, significant weight changes sweats headaches vision problems hearing changes,  Hard o fhearing not using aids  CV/ PULM; No chest pain shortness of breath cough, syncope,edema  change in exercise tolerance. GI /GU: No adominal pain, vomiting, change in bowel habits.  SKIN/HEME: ,no acute skin rashes suspicious lesions  No lymphadenopathy, nodules, masses.  NEURO/ PSYCH:  No neurologic signs such as weakness numbness. No depression anxiety. IMM/ Allergy: No unusual infections.  Allergy .   REST of 12 system review negative except as per HPI   Past Medical History:  Diagnosis Date  . Anemia   . Arthritis   . Bilateral edema of lower extremity   . 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 Chronic lower back pain   . CKD (chronic kidney disease) stage 3, GFR 30-59 ml/min (HCC)   . DDD (degenerative disc disease)    lumbar  .  Diverticulosis   . Family history of breast cancer   . Family history of pancreatic cancer   . Family history of prostate cancer   . Flaccid neuropathic bladder, not elsewhere classified   . Foley catheter in place   . GERD (gastroesophageal reflux disease)   . Hemorrhoids   . Hiatal hernia   . History of colon polyps   . History of radiation therapy 09-12-2015 to 10-12-2015   42.72 gray in 16 fractions directed right breast w/ boost of 12 gray in 6 fractions directed at the lumpectomy cavity- Total dose: 54.74y  . History of recurrent UTIs   . Hyperlipidemia   . PONV (postoperative nausea and vomiting)   . RBBB (right bundle branch block with left anterior fascicular block)   . Type 2 diabetes mellitus ([place])   .  Urinary retention with incomplete bladder emptying   . Wears dentures    full upper and lower partial  . Wears glasses   . Wears hearing aid    bilateral but does not wear    Family History  Problem Relation Age of Onset  . Pancreatic cancer Mother 60  . Diabetes Mother   . Prostate cancer Brother   . Diabetes Father   . Heart disease Father   . Diabetes Brother   . Heart attack Brother   . Diabetes Sister   . Diabetes Daughter   . Diabetes Son   . Coronary artery disease Brother        MI in his 27s  . Colon cancer Neg Hx   . Stomach cancer Neg Hx     Social History   Socioeconomic History  . Marital status: Married    Spouse name: None  . Number of children: 2  . Years of education: None  . Highest education level: None  Social Needs  . Financial resource strain: None  . Food insecurity - worry: None  . Food insecurity - inability: None  . Transportation needs - medical: None  . Transportation needs - non-medical: None  Occupational History  . Occupation: Retired  Tobacco Use  . Smoking status: Never Smoker  . Smokeless tobacco: Never Used  Substance and Sexual Activity  . Alcohol use: No  . Drug use: No  . Sexual activity: No  Other Topics Concern  . None  Social History Narrative   HH 2 married   Works for [employer] for 40 years retired   No pets    Lives w spouse; married in 72; 56 years     Outpatient Encounter Medications as of 08/26/2017  Medication Sig  . acetaminophen (TYLENOL) 500 MG tablet Take 500 mg by mouth every 6 (six) hours as needed for mild pain.  Marland Kitchen aspirin EC 81 MG tablet Take 81 mg by mouth daily.  Marland Kitchen atorvastatin (LIPITOR) 10 MG tablet Take 1 tablet (10 mg total) by mouth daily.  . Calcium Citrate-Vitamin D (CALCIUM CITRATE +D PO) Take 630 mg by mouth 2 (two) times daily. Vit d3 is 500iu  . Cholecalciferol (VITAMIN D3) 2000 units TABS Take by mouth every morning.  . Cyanocobalamin (B-12 PO) Take 1 tablet by mouth daily.  Marland Kitchen  esomeprazole (NEXIUM) 40 MG capsule Take 1 capsule (40 mg total) by mouth daily at 12 noon. PLEASE CALL TO SCHEDULE OFFICE VISIT FOR FURTHER REFILLS. THANKS.  . furosemide (LASIX) 40 MG tablet Take 0.5 tablets (20 mg total) by mouth daily. PLEASE CALL TO SCHEDULE OFFICE VISIT FOR FURTHER REFILLS. THANKS.  Marland Kitchen  gabapentin (NEURONTIN) 300 MG capsule TAKE 1 CAPSULE(300 MG) BY MOUTH AT BEDTIME  . glipiZIDE (GLUCOTROL XL) 5 MG 24 hr tablet Take 1 tablet (5 mg total) by mouth daily.  Glory Rosebush VERIO test strip   . Potassium 99 MG TABS Take 1 tablet by mouth every morning.  . Blood Glucose Monitoring Suppl (ACCU-CHEK AVIVA PLUS) w/Device KIT Check blood sugar TID-QID  . [DISCONTINUED] glucose blood (ACCU-CHEK AVIVA PLUS) test strip Use as instructed   No facility-administered encounter medications on file as of 08/26/2017.     EXAM:  BP 122/70 (BP Location: Right Arm, Patient Position: Sitting, Cuff Size: Normal)   Pulse 91   Temp (!) 97.5 F (36.4 C) (Oral)   Ht _0  (1.575 m)   Wt 156 lb 3.2 oz (70.9 kg)   BMI 28.57 kg/m   Body mass index is 28.57 kg/m.  Physical Exam: Vital signs reviewed VEL:FYBO is a well-developed well-nourished alert cooperative   who appears stated age in no acute distress.  HEENT: normocephalic atraumatic , Eyes: PERRL EOM's full, conjunctiva clear, Nares: paten,t no deformity discharge or tenderness., Ears: no deformity EAC's clear TMs with normal landmarks. Mouth: clear OP, no lesions, edema.  Moist mucous membranes. Dentition in adequate repair. NECK: supple without masses, thyromegaly or bruits. CHEST/PULM:  Clear to auscultation and percussion breath sounds equal no wheeze , rales or rhonchi. No chest wall deformities or tenderness. CV: PMI is nondisplaced, S1 S2 no gallops, murmurs, rubs. Peripheral pulses are full without delay.No JVD .  Breast   Changes right from caner rx  Thickened  Skin  ABDOMEN: Bowel sounds normal nontender  No guard or rebound, no  hepato splenomegal no CVA tenderness.   catherter urinary in place  Extremtities:  No clubbing cyanosis or edema, no acute joint swelling or redness  Right thumb  Cmc joint swollen   But no  focal atrophy NEURO:  Oriented x3, cranial nerves 3-12 appear to be intact, no obvious focal weakness,gait within normal limits no abnormal reflexes or asymmetrical SKIN: No acute rashes normal turgor, color, no bruising or petechiae. PSYCH: Oriented, good eye contact, no obvious depression anxiety, cognition and judgment appear normal. LN: no cervical axillary inguinal adenopathy Rectal  Crop of henmorrhoids  No bleed  Rectal exam no internal masses felt  No noted deficits in memory, attention, and speech. Diabetic Foot Exam - Simple   Simple Foot Form Diabetic Foot exam was performed with the following findings:  Yes 08/26/2017  5:51 PM  Visual Inspection No deformities, no ulcerations, no other skin breakdown bilaterally:  Yes Sensation Testing Intact to touch and monofilament testing bilaterally:  Yes Pulse Check Posterior Tibialis and Dorsalis pulse intact bilaterally:  Yes Comments      ASSESSMENT AND PLAN:  Discussed the following assessment and plan:  Controlled type 2 diabetes mellitus without complication, without long-term current use of insulin ([place]) - Plan: Basic metabolic panel, CBC with Differential/Platelet, Hemoglobin A1c, Hepatic function panel, Lipid panel, TSH, Microalbumin / creatinine urine ratio  Medication management - Plan: Basic metabolic panel, CBC with Differential/Platelet, Hemoglobin A1c, Hepatic function panel, Lipid panel, TSH, Microalbumin / creatinine urine ratio  Hyperlipidemia, unspecified hyperlipidemia type - Plan: Basic metabolic panel, CBC with Differential/Platelet, Hemoglobin A1c, Hepatic function panel, Lipid panel, TSH, Microalbumin / creatinine urine ratio  Renal insufficiency - Plan: Basic metabolic panel, CBC with Differential/Platelet, Hemoglobin  A1c, Hepatic function panel, Lipid panel, TSH, Microalbumin / creatinine urine ratio  Diabetes mellitus, stable ([place]) - Plan:  Basic metabolic panel, CBC with Differential/Platelet, Hemoglobin A1c, Hepatic function panel, Lipid panel, TSH, Microalbumin / creatinine urine ratio  Osteoarthritis, unspecified osteoarthritis type, unspecified site - Plan: Basic metabolic panel, CBC with Differential/Platelet, Hemoglobin A1c, Hepatic function panel, Lipid panel, TSH, Microalbumin / creatinine urine ratio  Wears hearing aid  Hemorrhoids, unspecified hemorrhoid type - Plan: Basic metabolic panel, CBC with Differential/Platelet, Hemoglobin A1c, Hepatic function panel, Lipid panel, TSH, Microalbumin / creatinine urine ratio  Malignant neoplasm of upper-outer quadrant of right breast in female, estrogen receptor negative (HCC)  Urinary catheter present   Local care disc hemorrhoids   Get meter to be sent in  encouraged to adapt to hearing assistance .   Lab today  Can see the hand specialist if needed contact us   Total visit 40 mins > 50% spent counseling and coordinating care as indicated in above note and in instructions to patient .  addressed disease state as and    Expectant management. For  List of CO and plan  Do not know why she  Has not seen urologist since catheter placed? Just getting replaced ?  Had ? temporary problem vs neuro bladder   After laminectomy     Patient Care Team: Torrell Krutz, Standley Brooking, MD as PCP - General (Internal Medicine) Monna Fam, MD (Ophthalmology) Nicholas Lose, MD as Consulting Physician (Hematology and Oncology) Gery Pray, MD as Consulting Physician (Radiation Oncology) [name], MD as Consulting Physician (General Surgery) [name], MD as Consulting Physician (Urology) Gardenia Phlegm, NP as Nurse Practitioner (Hematology and Oncology)   Lab Results  Component Value Date   WBC 4.6 08/26/2017   HGB 13.0 08/26/2017   HCT 38.8  08/26/2017   PLT 223.0 08/26/2017   GLUCOSE 152 (H) 08/26/2017   CHOL 164 08/26/2017   TRIG 228.0 (H) 08/26/2017   HDL 36.20 (L) 08/26/2017   LDLDIRECT 87.0 08/26/2017   LDLCALC 83 12/16/2013   ALT 10 08/26/2017   AST 12 08/26/2017   NA 142 08/26/2017   K 4.3 08/26/2017   CL 104 08/26/2017   CREATININE 1.23 (H) 08/26/2017   BUN 24 (H) 08/26/2017   CO2 34 (H) 08/26/2017   TSH 4.09 08/26/2017   INR 1.01 02/27/2016   HGBA1C 7.4 (H) 08/26/2017   MICROALBUR 5.8 (H) 08/26/2017    Patient Instructions  Will notify you  of labs when available. Try anusol cream   Tucks ok for hemorrhoids  But if  persistent or progressive can see  GI  No findings today of concern .    Will get rx for glucometer and strips to be send by end of day .   Plan follow up  Depending on blood tests or 6 months   Try using hearing aids for a whole month to get used to them.      Standley Brooking. Cattleya Dobratz M.D.

## 2017-08-26 ENCOUNTER — Telehealth: Payer: Self-pay | Admitting: Family Medicine

## 2017-08-26 ENCOUNTER — Encounter: Payer: Self-pay | Admitting: Internal Medicine

## 2017-08-26 ENCOUNTER — Ambulatory Visit (INDEPENDENT_AMBULATORY_CARE_PROVIDER_SITE_OTHER): Payer: Medicare Other | Admitting: Internal Medicine

## 2017-08-26 VITALS — BP 122/70 | HR 91 | Temp 97.5°F | Ht 62.0 in | Wt 156.2 lb

## 2017-08-26 DIAGNOSIS — Z171 Estrogen receptor negative status [ER-]: Secondary | ICD-10-CM | POA: Diagnosis not present

## 2017-08-26 DIAGNOSIS — E785 Hyperlipidemia, unspecified: Secondary | ICD-10-CM | POA: Diagnosis not present

## 2017-08-26 DIAGNOSIS — M1990 Unspecified osteoarthritis, unspecified site: Secondary | ICD-10-CM | POA: Diagnosis not present

## 2017-08-26 DIAGNOSIS — C50411 Malignant neoplasm of upper-outer quadrant of right female breast: Secondary | ICD-10-CM | POA: Diagnosis not present

## 2017-08-26 DIAGNOSIS — Z79899 Other long term (current) drug therapy: Secondary | ICD-10-CM

## 2017-08-26 DIAGNOSIS — E119 Type 2 diabetes mellitus without complications: Secondary | ICD-10-CM | POA: Diagnosis not present

## 2017-08-26 DIAGNOSIS — K649 Unspecified hemorrhoids: Secondary | ICD-10-CM | POA: Diagnosis not present

## 2017-08-26 DIAGNOSIS — Z9289 Personal history of other medical treatment: Secondary | ICD-10-CM

## 2017-08-26 DIAGNOSIS — Z974 Presence of external hearing-aid: Secondary | ICD-10-CM | POA: Diagnosis not present

## 2017-08-26 DIAGNOSIS — Z960 Presence of urogenital implants: Secondary | ICD-10-CM

## 2017-08-26 DIAGNOSIS — N289 Disorder of kidney and ureter, unspecified: Secondary | ICD-10-CM | POA: Diagnosis not present

## 2017-08-26 LAB — CBC WITH DIFFERENTIAL/PLATELET
Basophils Absolute: 0.1 10*3/uL (ref 0.0–0.1)
Basophils Relative: 1.2 % (ref 0.0–3.0)
Eosinophils Absolute: 0.4 10*3/uL (ref 0.0–0.7)
Eosinophils Relative: 8 % — ABNORMAL HIGH (ref 0.0–5.0)
HCT: 38.8 % (ref 36.0–46.0)
Hemoglobin: 13 g/dL (ref 12.0–15.0)
Lymphocytes Relative: 31 % (ref 12.0–46.0)
Lymphs Abs: 1.4 10*3/uL (ref 0.7–4.0)
MCHC: 33.5 g/dL (ref 30.0–36.0)
MCV: 92.2 fl (ref 78.0–100.0)
Monocytes Absolute: 0.5 10*3/uL (ref 0.1–1.0)
Monocytes Relative: 11.9 % (ref 3.0–12.0)
Neutro Abs: 2.2 10*3/uL (ref 1.4–7.7)
Neutrophils Relative %: 47.9 % (ref 43.0–77.0)
Platelets: 223 10*3/uL (ref 150.0–400.0)
RBC: 4.21 Mil/uL (ref 3.87–5.11)
RDW: 14.2 % (ref 11.5–15.5)
WBC: 4.6 10*3/uL (ref 4.0–10.5)

## 2017-08-26 LAB — LIPID PANEL
Cholesterol: 164 mg/dL (ref 0–200)
HDL: 36.2 mg/dL — ABNORMAL LOW (ref >39.00)
NonHDL: 128.29
Total CHOL/HDL Ratio: 5
Triglycerides: 228 mg/dL — ABNORMAL HIGH (ref 0.0–149.0)
VLDL: 45.6 mg/dL — ABNORMAL HIGH (ref 0.0–40.0)

## 2017-08-26 LAB — HEPATIC FUNCTION PANEL
ALT: 10 U/L (ref 0–35)
AST: 12 U/L (ref 0–37)
Albumin: 4.1 g/dL (ref 3.5–5.2)
Alkaline Phosphatase: 77 U/L (ref 39–117)
Bilirubin, Direct: 0.1 mg/dL (ref 0.0–0.3)
Total Bilirubin: 0.6 mg/dL (ref 0.2–1.2)
Total Protein: 7 g/dL (ref 6.0–8.3)

## 2017-08-26 LAB — MICROALBUMIN / CREATININE URINE RATIO
Creatinine,U: 140 mg/dL
Microalb Creat Ratio: 4.1 mg/g (ref 0.0–30.0)
Microalb, Ur: 5.8 mg/dL — ABNORMAL HIGH (ref 0.0–1.9)

## 2017-08-26 LAB — BASIC METABOLIC PANEL
BUN: 24 mg/dL — ABNORMAL HIGH (ref 6–23)
CO2: 34 mEq/L — ABNORMAL HIGH (ref 19–32)
Calcium: 9.8 mg/dL (ref 8.4–10.5)
Chloride: 104 mEq/L (ref 96–112)
Creatinine, Ser: 1.23 mg/dL — ABNORMAL HIGH (ref 0.40–1.20)
GFR: 44.05 mL/min — ABNORMAL LOW (ref >60.00)
Glucose, Bld: 152 mg/dL — ABNORMAL HIGH (ref 70–99)
Potassium: 4.3 mEq/L (ref 3.5–5.1)
Sodium: 142 mEq/L (ref 135–145)

## 2017-08-26 LAB — TSH: TSH: 4.09 u[IU]/mL (ref 0.35–4.50)

## 2017-08-26 LAB — HEMOGLOBIN A1C: Hgb A1c MFr Bld: 7.4 % — ABNORMAL HIGH (ref 4.6–6.5)

## 2017-08-26 LAB — LDL CHOLESTEROL, DIRECT: Direct LDL: 87 mg/dL

## 2017-08-26 MED ORDER — ACCU-CHEK AVIVA PLUS W/DEVICE KIT: PACK | 0 refills | Status: DC

## 2017-08-26 MED ORDER — GLUCOSE BLOOD VI STRP: ORAL_STRIP | 12 refills | Status: DC

## 2017-08-26 NOTE — Patient Instructions (Addendum)
Will notify you  of labs when available. Try anusol cream   Tucks ok for hemorrhoids  But if  persistent or progressive can see  GI  No findings today of concern .    Will get rx for glucometer and strips to be send by end of day .   Plan follow up  Depending on blood tests or 6 months   Try using hearing aids for a whole month to get used to them.

## 2017-08-26 NOTE — Telephone Encounter (Signed)
ATC call pharmacy - placed on long hold Rx re-sent to [facility] with detailed instructions for insurance purposes.  Nothing further needed.

## 2017-08-26 NOTE — Telephone Encounter (Signed)
Copied from [place]. Topic: Quick Communication - Rx Refill/Question >> Aug 26, 2017 10:32 AM Oneta Rack wrote:  Caller name: Chance  Relation to pt: [facility]  Call back number: [phone]  Pharmacy: [facility], [place] [phone] (Phone) [phone] (Fax)    Reason for call:  Pharmacy requesting new RX for glucose blood (ACCU-CHEK AVIVA PLUS) test strip specifying "how often" ( due to insurance purposes ) instead of "Use as instructed" please advise  >> Aug 26, 2017 10:38 AM Oneta Rack wrote:  Caller name: Chance  Relation to pt: [facility]  Call back number: [phone]  Pharmacy: [facility], [place] [phone] (Phone) [phone] (Fax)    Reason for call:  Pharmacy requesting new RX for glucose blood (ACCU-CHEK AVIVA PLUS) test strip specifying "how often" ( due to insurance purposes ) instead of "Use as instructed" please advise

## 2017-09-02 ENCOUNTER — Encounter: Payer: Self-pay | Admitting: *Deleted

## 2017-09-02 NOTE — Telephone Encounter (Signed)
This encounter was created in error - please disregard.

## 2017-09-04 DIAGNOSIS — R922 Inconclusive mammogram: Secondary | ICD-10-CM | POA: Diagnosis not present

## 2017-09-04 DIAGNOSIS — Z853 Personal history of malignant neoplasm of breast: Secondary | ICD-10-CM | POA: Diagnosis not present

## 2017-09-15 DIAGNOSIS — C50911 Malignant neoplasm of unspecified site of right female breast: Secondary | ICD-10-CM | POA: Diagnosis not present

## 2017-09-29 DIAGNOSIS — N312 Flaccid neuropathic bladder, not elsewhere classified: Secondary | ICD-10-CM | POA: Diagnosis not present

## 2017-10-07 ENCOUNTER — Other Ambulatory Visit: Payer: Self-pay | Admitting: Internal Medicine

## 2017-11-10 DIAGNOSIS — N312 Flaccid neuropathic bladder, not elsewhere classified: Secondary | ICD-10-CM | POA: Diagnosis not present

## 2017-12-22 DIAGNOSIS — N312 Flaccid neuropathic bladder, not elsewhere classified: Secondary | ICD-10-CM | POA: Diagnosis not present

## 2018-01-22 ENCOUNTER — Other Ambulatory Visit: Payer: Self-pay | Admitting: Internal Medicine

## 2018-01-22 DIAGNOSIS — E785 Hyperlipidemia, unspecified: Secondary | ICD-10-CM

## 2018-01-22 DIAGNOSIS — N289 Disorder of kidney and ureter, unspecified: Secondary | ICD-10-CM

## 2018-01-22 DIAGNOSIS — E119 Type 2 diabetes mellitus without complications: Secondary | ICD-10-CM

## 2018-01-23 ENCOUNTER — Telehealth: Payer: Self-pay | Admitting: Family Medicine

## 2018-01-23 DIAGNOSIS — N289 Disorder of kidney and ureter, unspecified: Secondary | ICD-10-CM

## 2018-01-23 NOTE — Telephone Encounter (Signed)
Refill request for Laisx 40 mg take 1/2 tablet every day and send to [facility]. Can we refill this?

## 2018-01-23 NOTE — Telephone Encounter (Signed)
Ok to refill x 90 days  Has appt next month

## 2018-01-23 NOTE — Telephone Encounter (Signed)
Refill request, will check with provider for approval on the Lasix refill.

## 2018-01-26 MED ORDER — FUROSEMIDE 40 MG PO TABS: 20.0000 mg | ORAL_TABLET | Freq: Every day | ORAL | 0 refills | Status: DC

## 2018-01-26 NOTE — Telephone Encounter (Signed)
I sent script e-scribe to mail order.

## 2018-02-04 DIAGNOSIS — R509 Fever, unspecified: Secondary | ICD-10-CM | POA: Diagnosis not present

## 2018-02-04 DIAGNOSIS — N312 Flaccid neuropathic bladder, not elsewhere classified: Secondary | ICD-10-CM | POA: Diagnosis not present

## 2018-02-06 ENCOUNTER — Other Ambulatory Visit: Payer: Self-pay

## 2018-02-22 NOTE — Progress Notes (Signed)
Chief Complaint  Patient presents with  . Follow-up    HPI: [name] 84 y.o. come in for Chronic disease management  DM:  Eating more sweets   Not sure sugars are as good no low and no vision changes falling or new numbness better  protection   Intermittent right ear neck pain : no assoc sx Hearing aids  Not used a lot   Right ear  Pain  No swallowing.   fever  Cold   Not hurting now.   Right mtp area foot pain at times mild swelling not injury or  Redness fever numbness   immuniz disc   ROS: See pertinent positives and negatives per HPI. No cp sob swelling otherwise  Vision changes   Past Medical History:  Diagnosis Date  . Anemia   . Arthritis   . Bilateral edema of lower extremity   . 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 Chronic lower back pain   . CKD (chronic kidney disease) stage 3, GFR 30-59 ml/min (HCC)   . DDD (degenerative disc disease)    lumbar  . Diverticulosis   . Family history of breast cancer   . Family history of pancreatic cancer   . Family history of prostate cancer   . Flaccid neuropathic bladder, not elsewhere classified   . Foley catheter in place   . GERD (gastroesophageal reflux disease)   . Hemorrhoids   . Hiatal hernia   . History of colon polyps   . History of radiation therapy 09-12-2015 to 10-12-2015   42.72 gray in 16 fractions directed right breast w/ boost of 12 gray in 6 fractions directed at the lumpectomy cavity- Total dose: 54.74y  . History of recurrent UTIs   . Hyperlipidemia   . PONV (postoperative nausea and vomiting)   . RBBB (right bundle branch block with left anterior fascicular block)   . Type 2 diabetes mellitus ([place])   . Urinary retention with incomplete bladder emptying   . Wears dentures    full  upper and lower partial  . Wears glasses   . Wears hearing aid    bilateral but does not wear    Family History  Problem Relation Age of Onset  . Pancreatic cancer Mother 47  . Diabetes Mother   . Prostate cancer Brother   . Diabetes Father   . Heart disease Father   . Diabetes Brother   . Heart attack Brother   . Diabetes Sister   . Diabetes Daughter   . Diabetes Son   . Coronary artery disease Brother        MI in his 55s  . Colon cancer Neg Hx   . Stomach cancer Neg Hx     Social History   Socioeconomic History  . Marital status: Married    Spouse name: Not on file  . Number of children: 2  . Years of education: Not on file  . Highest education level: Not on file  Occupational History  . Occupation: Retired  Scientific laboratory technician  . Financial resource strain: Not on file  . Food insecurity:    Worry: Not on file    Inability: Not on file  . Transportation needs:    Medical: Not on file    Non-medical: Not  on file  Tobacco Use  . Smoking status: Never Smoker  . Smokeless tobacco: Never Used  Substance and Sexual Activity  . Alcohol use: No  . Drug use: No  . Sexual activity: Never  Lifestyle  . Physical activity:    Days per week: Not on file    Minutes per session: Not on file  . Stress: Not on file  Relationships  . Social connections:    Talks on phone: Not on file    Gets together: Not on file    Attends religious service: Not on file    Active member of club or organization: Not on file    Attends meetings of clubs or organizations: Not on file    Relationship status: Not on file  Other Topics Concern  . Not on file  Social History Narrative   HH 2 married   Works for [employer] for 40 years retired   No pets    Lives w spouse; married in 72; 51 years     Outpatient Medications Prior to Visit  Medication Sig Dispense Refill  . acetaminophen (TYLENOL) 500 MG tablet Take 500 mg by mouth every 6 (six) hours as needed for mild pain.    Marland Kitchen aspirin EC  81 MG tablet Take 81 mg by mouth daily.    Marland Kitchen atorvastatin (LIPITOR) 10 MG tablet TAKE 1 TABLET DAILY 90 tablet 0  . Blood Glucose Monitoring Suppl (ACCU-CHEK AVIVA PLUS) w/Device KIT Check blood sugar TID-QID 1 kit 0  . Calcium Citrate-Vitamin D (CALCIUM CITRATE +D PO) Take 630 mg by mouth 2 (two) times daily. Vit d3 is 500iu    . Cholecalciferol (VITAMIN D3) 2000 units TABS Take by mouth every morning.    . Cyanocobalamin (B-12 PO) Take 1 tablet by mouth daily.    Marland Kitchen esomeprazole (NEXIUM) 40 MG capsule Take 1 capsule (40 mg total) by mouth daily at 12 noon. PLEASE CALL TO SCHEDULE OFFICE VISIT FOR FURTHER REFILLS. THANKS. 90 capsule 0  . furosemide (LASIX) 40 MG tablet Take 0.5 tablets (20 mg total) by mouth daily. 45 tablet 0  . gabapentin (NEURONTIN) 300 MG capsule TAKE 1 CAPSULE(300 MG) BY MOUTH AT BEDTIME 90 capsule 1  . glipiZIDE (GLUCOTROL XL) 5 MG 24 hr tablet TAKE 1 TABLET DAILY 90 tablet 0  . glucose blood (ACCU-CHEK AVIVA PLUS) test strip Use TID-QID to check blood sugars. 100 each 12  . ONETOUCH VERIO test strip     . Potassium 99 MG TABS Take 1 tablet by mouth every morning.     No facility-administered medications prior to visit.      EXAM:  BP 122/64 (BP Location: Left Arm, Patient Position: Sitting, Cuff Size: Normal)   Pulse 90   Temp 97.9 F (36.6 C) (Oral)   Ht 5' 2"  (1.575 m)   Wt 158 lb (71.7 kg)   SpO2 94%   BMI 28.90 kg/m   Body mass index is 28.9 kg/m.  GENERAL: vitals reviewed and listed above, alert, oriented, appears well hydrated and in no acute distress  Independent gait  Well appearing  HEENT: atraumatic, conjunctiva  clear, no obvious abnormalities on inspection of external nose and ears tmc clear  No tmj pain OP : no lesion edema or exudate   dentures edentulous  NECK: no obvious masses on inspection palpation  LUNGS: clear to auscultation bilaterally, no wheezes, rales or rhonchi, good air movement  CV: HRRR, no clubbing cyanosis or  peripheral  edema nl cap refill  MS: moves all extremities without noticeable focal  Abnormality  Right foot looks nl but after manipulation had some edema proximal  to mtp  nv seems intact  PSYCH: pleasant and cooperative, no obvious depression or anxiety Lab Results  Component Value Date   WBC 4.6 08/26/2017   HGB 13.0 08/26/2017   HCT 38.8 08/26/2017   PLT 223.0 08/26/2017   GLUCOSE 152 (H) 08/26/2017   CHOL 164 08/26/2017   TRIG 228.0 (H) 08/26/2017   HDL 36.20 (L) 08/26/2017   LDLDIRECT 87.0 08/26/2017   LDLCALC 83 12/16/2013   ALT 10 08/26/2017   AST 12 08/26/2017   NA 142 08/26/2017   K 4.3 08/26/2017   CL 104 08/26/2017   CREATININE 1.23 (H) 08/26/2017   BUN 24 (H) 08/26/2017   CO2 34 (H) 08/26/2017   TSH 4.09 08/26/2017   INR 1.01 02/27/2016   HGBA1C 7.4 (A) 02/23/2018   MICROALBUR 5.8 (H) 08/26/2017   BP Readings from Last 3 Encounters:  02/23/18 122/64  08/26/17 122/70  05/12/17 (!) 108/59   Wt Readings from Last 3 Encounters:  02/23/18 158 lb (71.7 kg)  08/26/17 156 lb 3.2 oz (70.9 kg)  05/12/17 158 lb 14.4 oz (72.1 kg)    ASSESSMENT AND PLAN:  Discussed the following assessment and plan:  Controlled type 2 diabetes mellitus without complication, without long-term current use of insulin (HCC) - a1c stable no  alarm sx could be better patient aware no change  fu at yearly in 6 months - Plan: POC HgB A1c  Medication management  Right ear pain intermittnet  - see text.  Right foot pain  Wears hearing aid  Osteoarthritis, unspecified osteoarthritis type, unspecified site    fine  May have been referred pain is edentulous and tmj seems ok will follow  Right mtp joint area   Has some  Post  Manipulation    Swelling without redness or warmth but no itching ? Cause  nml gait nl circulation and no defomity or excess callous .   Disc immuniz   Is utd will get  High dose flu  When available.  -Patient advised to return or notify health care team  if  new concerns  arise.  Patient Instructions   You are up  to date on pneumonia vaccines.   Get high dose flu vaccine when available before end of October .   a1c the same .  Ear is normal . I ffoot  Is continueing to get worse can see  Podiatry .   Yearly visit and lab in February       Immunization History  Administered Date(s) Administered  . Influenza Split 05/08/2011, 04/21/2012  . Influenza Whole 03/11/2006, 04/30/2007, 04/13/2008, 05/17/2009, 04/24/2010  . Influenza, High Dose Seasonal PF 05/11/2014, 05/01/2015, 04/26/2016, 04/29/2017  . Influenza,inj,Quad PF,6+ Mos 03/22/2013  . Pneumococcal Conjugate-13 12/16/2013  . Pneumococcal Polysaccharide-23 05/09/1999, 05/18/2008  . Tetanus 01/11/2013  . Zoster 05/06/2006        Standley Brooking. [place] M.D.

## 2018-02-23 ENCOUNTER — Ambulatory Visit (INDEPENDENT_AMBULATORY_CARE_PROVIDER_SITE_OTHER): Payer: Medicare Other | Admitting: Internal Medicine

## 2018-02-23 ENCOUNTER — Encounter: Payer: Self-pay | Admitting: Internal Medicine

## 2018-02-23 VITALS — BP 122/64 | HR 90 | Temp 97.9°F | Ht 62.0 in | Wt 158.0 lb

## 2018-02-23 DIAGNOSIS — E119 Type 2 diabetes mellitus without complications: Secondary | ICD-10-CM | POA: Diagnosis not present

## 2018-02-23 DIAGNOSIS — Z974 Presence of external hearing-aid: Secondary | ICD-10-CM | POA: Diagnosis not present

## 2018-02-23 DIAGNOSIS — Z79899 Other long term (current) drug therapy: Secondary | ICD-10-CM | POA: Diagnosis not present

## 2018-02-23 DIAGNOSIS — M79671 Pain in right foot: Secondary | ICD-10-CM | POA: Diagnosis not present

## 2018-02-23 DIAGNOSIS — H9201 Otalgia, right ear: Secondary | ICD-10-CM | POA: Diagnosis not present

## 2018-02-23 DIAGNOSIS — M1990 Unspecified osteoarthritis, unspecified site: Secondary | ICD-10-CM | POA: Diagnosis not present

## 2018-02-23 LAB — POCT GLYCOSYLATED HEMOGLOBIN (HGB A1C): HbA1c, POC (controlled diabetic range): 7.4 % — AB (ref 0.0–7.0)

## 2018-02-23 NOTE — Patient Instructions (Addendum)
You are up  to date on pneumonia vaccines.   Get high dose flu vaccine when available before end of October .   a1c the same .  Ear is normal . I ffoot  Is continueing to get worse can see  Podiatry .   Yearly visit and lab in February       Immunization History  Administered Date(s) Administered  . Influenza Split 05/08/2011, 04/21/2012  . Influenza Whole 03/11/2006, 04/30/2007, 04/13/2008, 05/17/2009, 04/24/2010  . Influenza, High Dose Seasonal PF 05/11/2014, 05/01/2015, 04/26/2016, 04/29/2017  . Influenza,inj,Quad PF,6+ Mos 03/22/2013  . Pneumococcal Conjugate-13 12/16/2013  . Pneumococcal Polysaccharide-23 05/09/1999, 05/18/2008  . Tetanus 01/11/2013  . Zoster 05/06/2006

## 2018-03-23 DIAGNOSIS — R3914 Feeling of incomplete bladder emptying: Secondary | ICD-10-CM | POA: Diagnosis not present

## 2018-04-14 ENCOUNTER — Other Ambulatory Visit: Payer: Self-pay | Admitting: Internal Medicine

## 2018-04-14 DIAGNOSIS — E785 Hyperlipidemia, unspecified: Secondary | ICD-10-CM

## 2018-04-14 DIAGNOSIS — E119 Type 2 diabetes mellitus without complications: Secondary | ICD-10-CM

## 2018-04-14 DIAGNOSIS — N289 Disorder of kidney and ureter, unspecified: Secondary | ICD-10-CM

## 2018-04-17 ENCOUNTER — Other Ambulatory Visit: Payer: Self-pay | Admitting: Internal Medicine

## 2018-05-15 ENCOUNTER — Encounter: Payer: Self-pay | Admitting: Adult Health

## 2018-05-15 ENCOUNTER — Inpatient Hospital Stay: Payer: Medicare Other | Attending: Adult Health | Admitting: Adult Health

## 2018-05-15 ENCOUNTER — Telehealth: Payer: Self-pay | Admitting: Adult Health

## 2018-05-15 VITALS — BP 121/84 | HR 92 | Temp 98.3°F | Resp 16 | Ht 62.0 in | Wt 156.3 lb

## 2018-05-15 DIAGNOSIS — Z853 Personal history of malignant neoplasm of breast: Secondary | ICD-10-CM

## 2018-05-15 DIAGNOSIS — Z171 Estrogen receptor negative status [ER-]: Secondary | ICD-10-CM

## 2018-05-15 DIAGNOSIS — C50411 Malignant neoplasm of upper-outer quadrant of right female breast: Secondary | ICD-10-CM

## 2018-05-15 NOTE — Progress Notes (Signed)
CLINIC:  Survivorship   REASON FOR VISIT:  Routine follow-up for history of breast cancer.   BRIEF ONCOLOGIC HISTORY:  Oncology History         Breast cancer of upper-outer quadrant of right female breast ([place])   06/27/2015 Mammogram    New oval focal asymmetry in right breast, posterior depth superior region    07/19/2015 Initial Diagnosis    Right breast biopsy: Invasive ductal carcinoma grade 2-3, PR 0%, PR 0%, Ki-67 20%, HER-2 negative ratio 1.77; 5 mm abnormality at 10:00 position    07/19/2015 Clinical Stage    Stage IA: T1b N0    08/02/2015 Procedure    Breast/Ovarian panel (geneDx) no deleterious mutations at ATM, BARD1, BRCA1, BRCA2, BRIP1, CDH1, CHEK2, EPCAM, FANCC, MLH1, MSH2, MSH6, NBN, PALB2, PMS2, PTEN, RAD51C, RAD51D, TP53, and XRCC2    08/03/2015 Surgery    Rt. Lumpectomy: IDC, 1.2 cm, Grade 3, with DCIS (in situ margin 0.1 cm), LN not taken, Er 0%, PR 0%, Her 2 Neg equivocal on FISH (ratio 1.93),  Negative for HER2 on IHC.    08/03/2015 Pathologic Stage    Stage IA: T1c Nx    09/13/2015 - 10/12/2015 Radiation Therapy    Adjuvant radiation therapy: 42.72 gray in 16 fractions directed right breast with a boost of 12 gray in 6 fractions directed at the lumpectomy cavity. Total dose: 54.72 Gy    12/21/2015 Survivorship    SCP visit completed      INTERVAL HISTORY:  [name] presents to the [facility] today for routine follow-up for her history of breast cancer.  Overall, she reports feeling quite well. She continues to see Dr. Regis Bill regularly.  She has not yet started regular use of the treadmill that we had discussed last year.  She denies any other issues today.  [name] notes that she did undergo mammogram this past year, at [facility].  We do not have these results.  Her ROS have remained stable.      REVIEW OF SYSTEMS:  Review of Systems  Constitutional: Negative for appetite change, chills, fatigue, fever and unexpected weight change.  HENT:    Negative for hearing loss and lump/mass.   Eyes: Negative for eye problems and icterus.  Respiratory: Negative for chest tightness, cough and shortness of breath.   Cardiovascular: Negative for chest pain, leg swelling and palpitations.  Gastrointestinal: Negative for abdominal distention, abdominal pain, constipation, diarrhea, nausea and vomiting.  Endocrine: Negative for hot flashes.  Skin: Negative for itching and rash.  Neurological: Negative for dizziness, extremity weakness, headaches and numbness.  Hematological: Negative for adenopathy. Does not bruise/bleed easily.  Psychiatric/Behavioral: Negative for depression. The patient is not nervous/anxious.   Breast: Denies any new nodularity, masses, tenderness, nipple changes, or nipple discharge.       PAST MEDICAL/SURGICAL HISTORY:  Past Medical History:  Diagnosis Date  . Anemia   . Arthritis   . Bilateral edema of lower extremity   . 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 Chronic lower back pain   . CKD (chronic kidney disease) stage 3, GFR 30-59 ml/min (HCC)   . DDD (degenerative disc disease)    lumbar  . Diverticulosis   . Family history of breast cancer   . Family history of pancreatic cancer   .  Family history of prostate cancer   . Flaccid neuropathic bladder, not elsewhere classified   . Foley catheter in place   . GERD (gastroesophageal reflux disease)   . Hemorrhoids   . Hiatal hernia   . History of colon polyps   . History of radiation therapy 09-12-2015 to 10-12-2015   42.72 gray in 16 fractions directed right breast w/ boost of 12 gray in 6 fractions directed at the lumpectomy cavity- Total dose: 54.72y  . History of recurrent UTIs   . Hyperlipidemia   . PONV (postoperative nausea and vomiting)     . RBBB (right bundle branch block with left anterior fascicular block)   . Type 2 diabetes mellitus ([place])   . Urinary retention with incomplete bladder emptying   . Wears dentures    full upper and lower partial  . Wears glasses   . Wears hearing aid    bilateral but does not wear   Past Surgical History:  Procedure Laterality Date  . BREAST LUMPECTOMY WITH RADIOACTIVE SEED LOCALIZATION Right 08/03/2015   Procedure: BREAST LUMPECTOMY WITH RADIOACTIVE SEED LOCALIZATION;  Surgeon: [name], MD;  Location: [facility];  Service: General;  Laterality: Right;  . CARDIOVASCULAR STRESS TEST  01/14/2012   Low risk nuclear study w/ small mild apical and apical lateral reversible perfusion defect represents a small area of ischemia versus shifting breast artifact/  normal LV funciton and wall motion, ef 86%  . CARPAL TUNNEL RELEASE Right 1977  . CATARACT EXTRACTION W/ INTRAOCULAR LENS  IMPLANT, BILATERAL Bilateral left 1996/  right 1997  . CLOSED RIGHT KNEE MANIPULATION  08/11/2002   post TKA  . CYSTOSTOMY N/A 05/17/2016   Procedure: CYSTOSCOPY WITH  SUPRAPUBIC PLACMENT;  Surgeon: [name], MD;  Location: [facility];  Service: Urology;  Laterality: N/A;  . GANGLION CYST EXCISION Right 12/09/2001   right palm  . KNEE ARTHROSCOPY Right 12/14/2002   w/ Lysis Adhesions  . LUMBAR LAMINECTOMY/DECOMPRESSION MICRODISCECTOMY N/A 03/06/2016   Procedure: CENTRAL DECOMPRESSION L3 - L4 ,L4 - L5;  Surgeon: [name], MD;  Location: [facility];  Service: Orthopedics;  Laterality: N/A;  . SHOULDER HEMI-ARTHROPLASTY Right 02/20/2009   avascular necrosis   . TOTAL KNEE ARTHROPLASTY Bilateral right 06-23-2002/  left 07-11-2008  . [place]  . VAGINAL HYSTERECTOMY  1975     ALLERGIES:  No Known Allergies   CURRENT MEDICATIONS:  Outpatient Encounter Medications as of 05/15/2018  Medication Sig Note  . acetaminophen (TYLENOL) 500 MG tablet Take  500 mg by mouth every 6 (six) hours as needed for mild pain.   Marland Kitchen aspirin EC 81 MG tablet Take 81 mg by mouth daily.   Marland Kitchen atorvastatin (LIPITOR) 10 MG tablet TAKE 1 TABLET DAILY   . Blood Glucose Monitoring Suppl (ACCU-CHEK AVIVA PLUS) w/Device KIT Check blood sugar TID-QID   . Calcium Citrate-Vitamin D (CALCIUM CITRATE +D PO) Take 630 mg by mouth 2 (two) times daily. Vit d3 is 500iu   . Cholecalciferol (VITAMIN D3) 2000 units TABS Take by mouth every morning.   . furosemide (LASIX) 40 MG tablet TAKE 1/2 TABLET (20 MG     TOTAL) DAILY   . gabapentin (NEURONTIN) 300 MG capsule TAKE 1 CAPSULE(300 MG) BY MOUTH AT BEDTIME   . glipiZIDE (GLUCOTROL XL) 5 MG 24 hr tablet TAKE 1 TABLET DAILY   . glucose blood (ACCU-CHEK AVIVA PLUS) test strip Use TID-QID to check blood sugars.   Glory Rosebush VERIO test  strip    . Potassium 99 MG TABS Take 1 tablet by mouth every morning.   . vitamin C (ASCORBIC ACID) 500 MG tablet Take 500 mg by mouth daily.   . [DISCONTINUED] Cyanocobalamin (B-12 PO) Take 1 tablet by mouth daily. 09/12/2016: Taking now every other day  . [DISCONTINUED] esomeprazole (NEXIUM) 40 MG capsule Take 1 capsule (40 mg total) by mouth daily at 12 noon. PLEASE CALL TO SCHEDULE OFFICE VISIT FOR FURTHER REFILLS. THANKS. (Patient not taking: Reported on 05/15/2018)    No facility-administered encounter medications on file as of 05/15/2018.      ONCOLOGIC FAMILY HISTORY:  Family History  Problem Relation Age of Onset  . Pancreatic cancer Mother 66  . Diabetes Mother   . Prostate cancer Brother   . Diabetes Father   . Heart disease Father   . Diabetes Brother   . Heart attack Brother   . Diabetes Sister   . Diabetes Daughter   . Diabetes Son   . Coronary artery disease Brother        MI in his 68s  . Colon cancer Neg Hx   . Stomach cancer Neg Hx     GENETIC COUNSELING/TESTING: See above  SOCIAL HISTORY:  [name] is married and lives with her husband in [place], [place].   She has 1 daughter who lives in [place], [place].  [name] is currently retired.  She denies any current or history of tobacco, alcohol, or illicit drug use.     PHYSICAL EXAMINATION:  Vital Signs: Vitals:   05/15/18 1101  BP: 121/84  Pulse: 92  Resp: 16  Temp: 98.3 F (36.8 C)  SpO2: 95%   Filed Weights   05/15/18 1101  Weight: 156 lb 4.8 oz (70.9 kg)   General: Well-nourished, well-appearing female in no acute distress.  Unaccompanied today.   HEENT: Head is normocephalic.  Pupils equal and reactive to light. Conjunctivae clear without exudate.  Sclerae anicteric. Oral mucosa is pink, moist.  Oropharynx is pink without lesions or erythema.  Lymph: No cervical, supraclavicular, or infraclavicular lymphadenopathy noted on palpation.  Cardiovascular: Regular rate and rhythm.Marland Kitchen Respiratory: Clear to auscultation bilaterally. Chest expansion symmetric; breathing non-labored.  Breast Exam:  -Left breast: No appreciable masses on palpation. No skin redness, thickening, or peau d'orange appearance; no nipple retraction or nipple discharge;  -Right breast: No appreciable masses on palpation. No skin redness, thickening, or peau d'orange appearance; no nipple retraction or nipple discharge; mild distortion in symmetry at previous lumpectomy site well healed scar without erythema or nodularity. -Axilla: No axillary adenopathy bilaterally.  GI: Abdomen soft and round; non-tender, non-distended. Bowel sounds normoactive. No hepatosplenomegaly.   GU: Deferred.  Neuro: No focal deficits. Steady gait.  Psych: Mood and affect normal and appropriate for situation.  MSK: No focal spinal tenderness to palpation, full range of motion in bilateral upper extremities Extremities: No edema. Skin: Warm and dry.  LABORATORY DATA:  None for this visit   DIAGNOSTIC IMAGING:  Most recent mammogram:  09/03/2016, normal   ASSESSMENT AND PLAN:  Ms.. [name] is a pleasant 84 y.o. female with history of  Stage IA right breast invasive ductal carcinoma, ER-/PR-/HER2-, diagnosed in 07/2015, treated with lumpectomy, adjuvant radiation therapy.  She presents to the Survivorship Clinic for surveillance and routine follow-up.   1. History of breast cancer:  [name] is currently clinically and radiographically without evidence of disease or recurrence of breast cancer. She will be due for mammogram in  08/2018.  We did not have a copy of February's mammogram from this year, so my nurse Cecille Rubin requested it. She will f/u with Dr. Donne Hazel in 6 months, she will see me back in one year.  I encouraged her to call me with any questions or concerns before her next visit at the cancer center, and I would be happy to see her sooner, if needed.    2. Bone health:  Given [name]'s age, history of breast cancer, she is at risk for bone demineralization. Her last DEXA scan was "years ago" and she says the results are okay.  She says she doesn't want repeat testing done.  She was given education on specific food and activities to promote bone health.  3. Cancer screening:  Due to [name]'s history and her age, she should receive screening for skin cancers. She was encouraged to follow-up with her PCP for appropriate cancer screenings.   4. Health maintenance and wellness promotion: [name] was encouraged to consume 5-7 servings of fruits and vegetables per day. She was also encouraged to engage in moderate to vigorous exercise for 30 minutes per day most days of the week. She was instructed to limit her alcohol consumption and continue to abstain from tobacco use.  Dispo:  -Return to cancer center in one year with LTS follow up -F/u with Dr. Donne Hazel in 6 months -Mammogram 08/2018   A total of (20) minutes of face-to-face time was spent with this patient with greater than 50% of that time in counseling and care-coordination.   Gardenia Phlegm, NP Survivorship Program [facility] [phone]   Note: PRIMARY CARE PROVIDER Burnis Medin, [place] [phone]

## 2018-05-15 NOTE — Telephone Encounter (Signed)
Per 11/8 los. Gave patient avs and calendar.

## 2018-05-19 ENCOUNTER — Ambulatory Visit (INDEPENDENT_AMBULATORY_CARE_PROVIDER_SITE_OTHER): Payer: Medicare Other

## 2018-05-19 DIAGNOSIS — Z23 Encounter for immunization: Secondary | ICD-10-CM

## 2018-06-15 DIAGNOSIS — N312 Flaccid neuropathic bladder, not elsewhere classified: Secondary | ICD-10-CM | POA: Diagnosis not present

## 2018-07-03 IMAGING — MR MR SACRUM / SI JOINTS WO CM
4 of 6 series · 19 of 48 positions shown · non-contrast
Comparison: None.

CLINICAL DATA: Low back pain 2 weeks.

EXAM:
MR SACRUM WITHOUT CONTRAST
TECHNIQUE: Multiplanar, multisequence MR imaging was performed. No intravenous
contrast was administered.

[Series 10: T1 · coronal · 4.0mm · 0.47mm/px · 6 of 20 slices shown (1 of 2)]
[im 1/20]
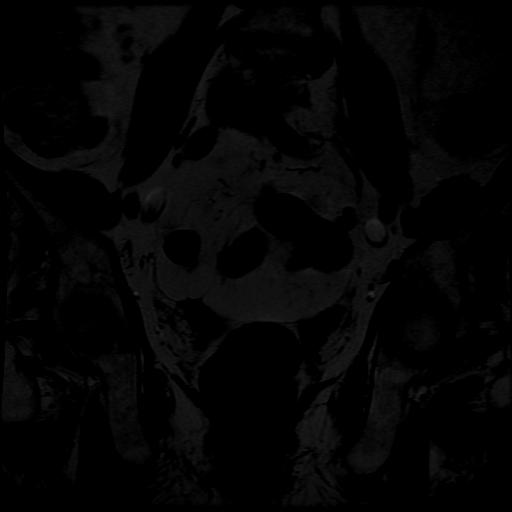
[im 4/20]
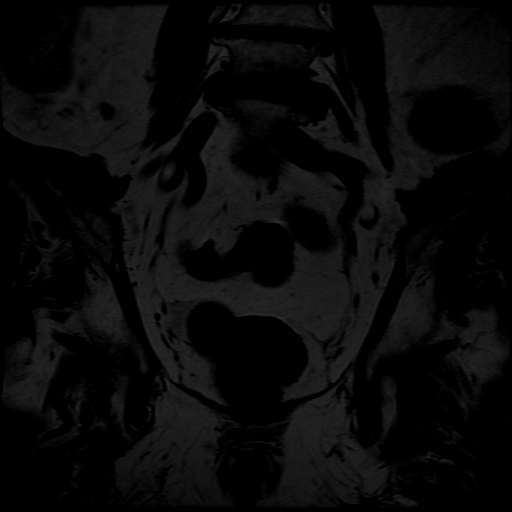
[im 8/20]
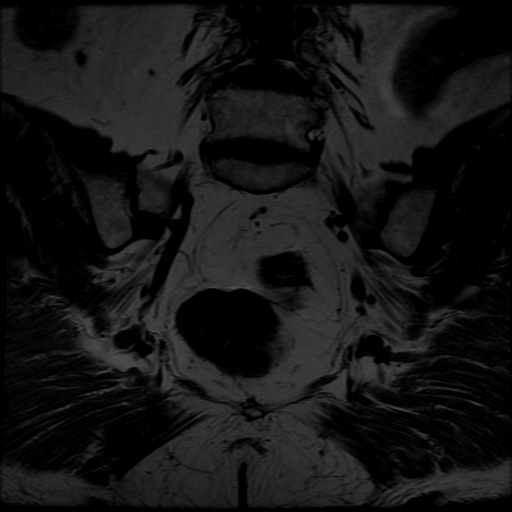
[im 12/20]
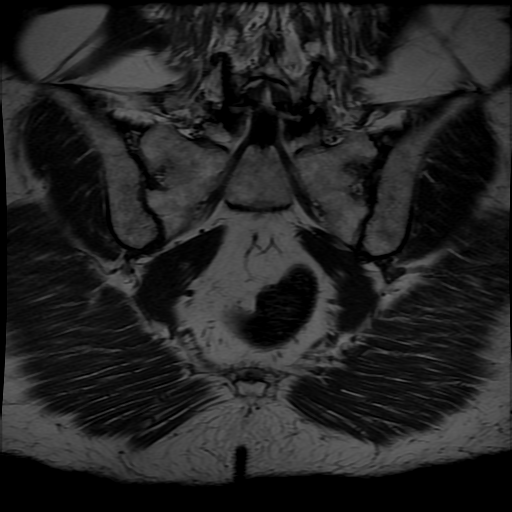
[im 16/20]
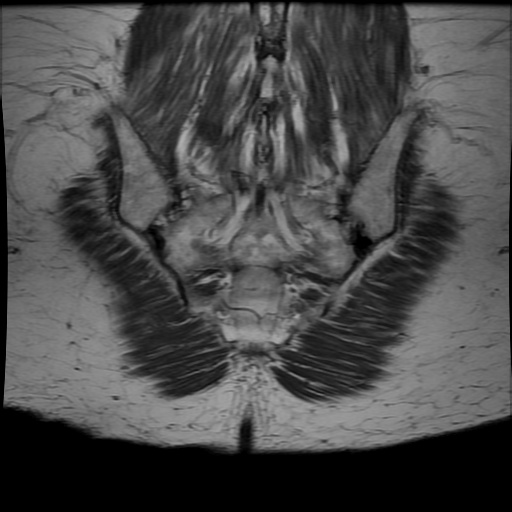
[im 20/20]
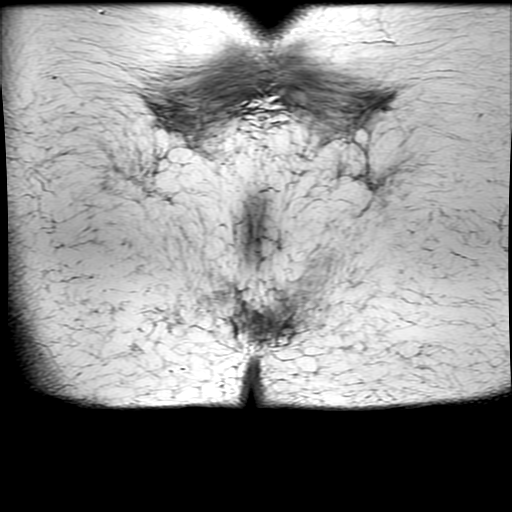

[Series 11: cor ir · coronal · 4.0mm · 0.47mm/px · 6 of 20 slices shown]
[im 1/20]
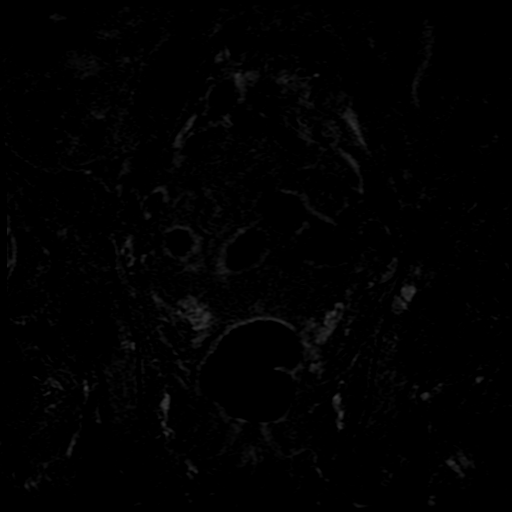
[im 4/20]
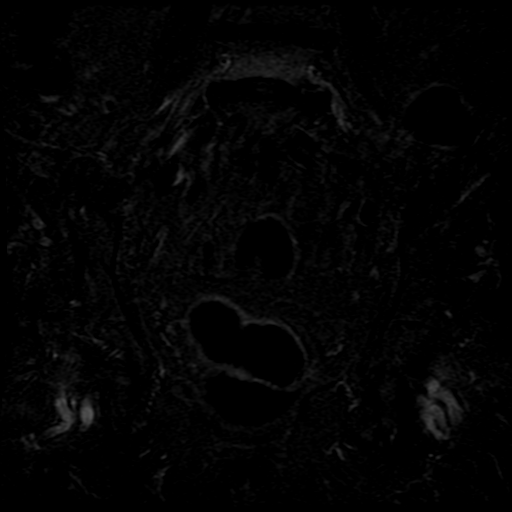
[im 8/20]
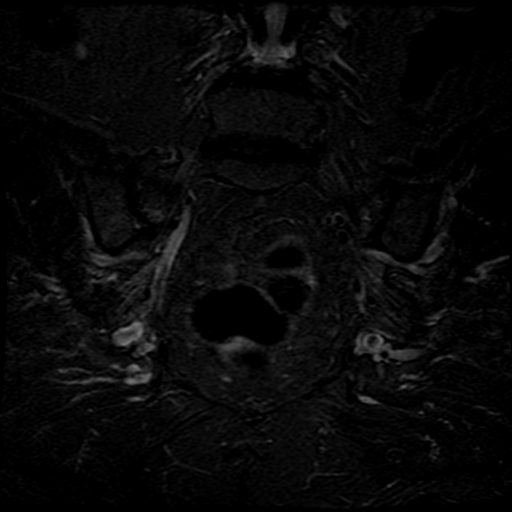
[im 12/20]
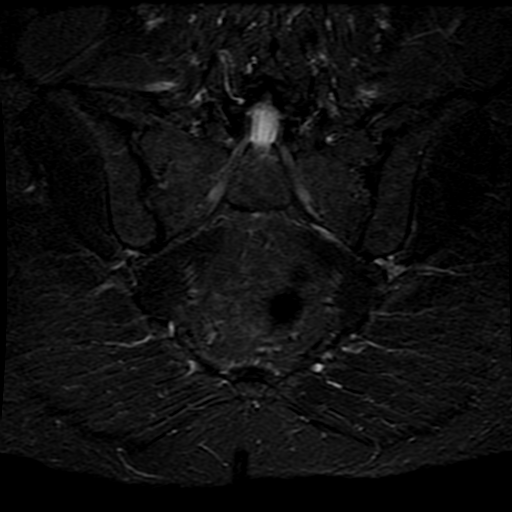
[im 16/20]
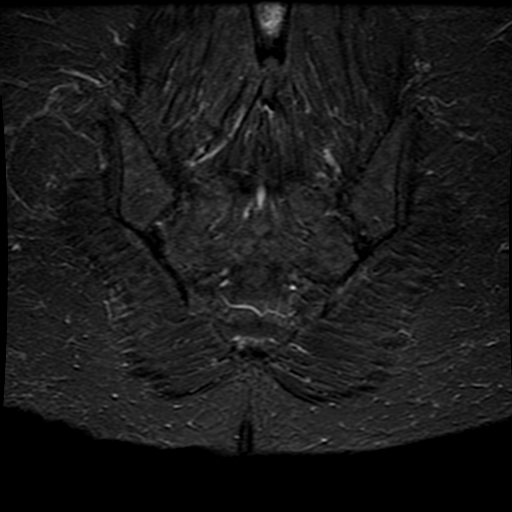
[im 20/20]
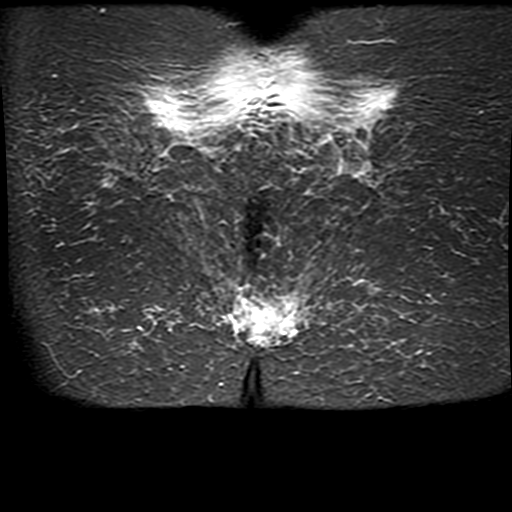

[Series 12: T1 · sagittal · 4.0mm · 0.47mm/px · 4 of 18 slices shown (2 of 2)]
[im 1/18]
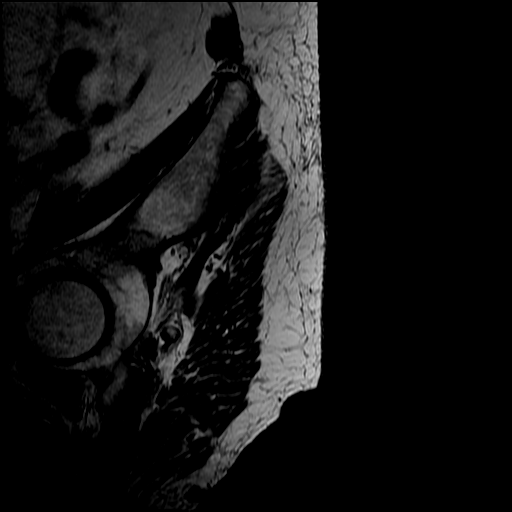
[im 4/18]
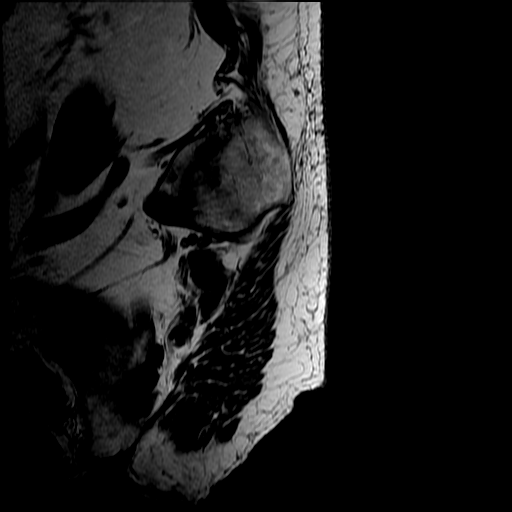
[im 11/18]
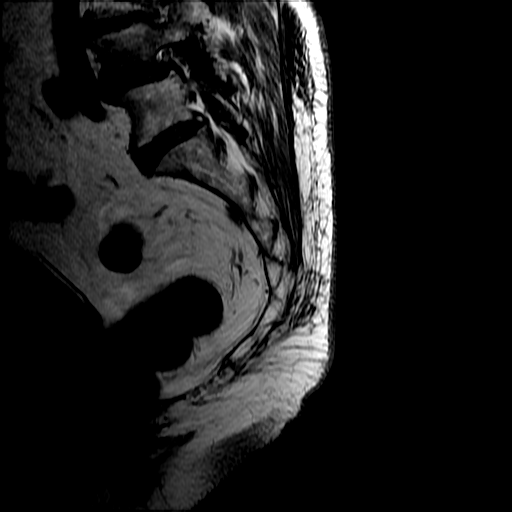
[im 18/18]
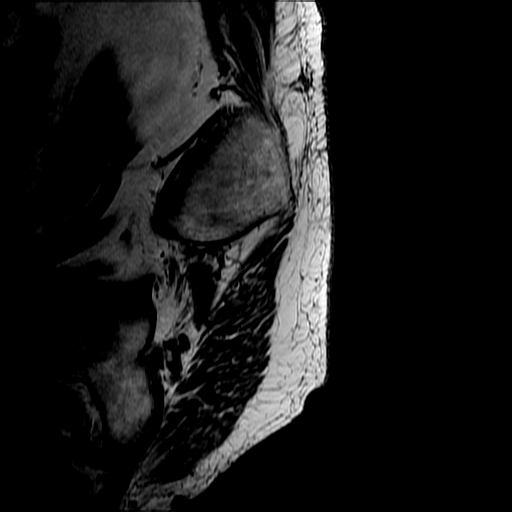

[Series 13: T1 fat-sat · axial · 4.0mm · 0.47mm/px · z∈[-157,-38]mm · 3 of 32 slices shown]
[im 4/32]
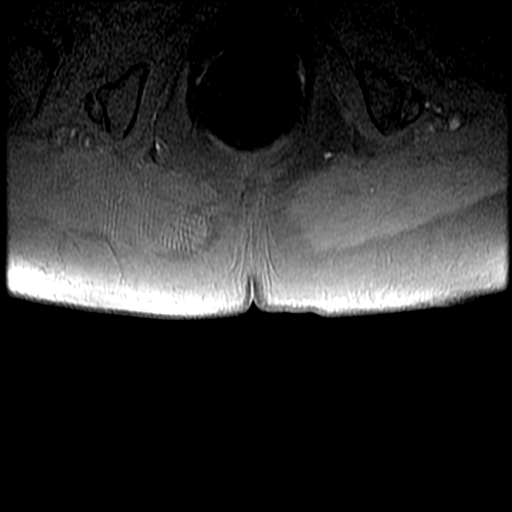
[im 18/32]
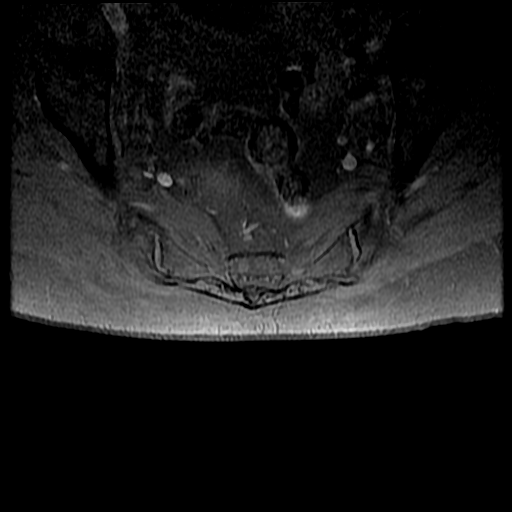
[im 28/32]
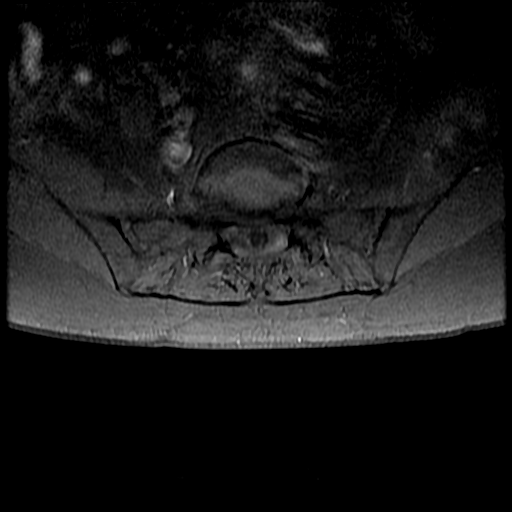

[19 of 48 positions shown; findings below may reference images not displayed]

FINDINGS: Compression fracture of the inferior endplate of L4 with bone marrow
edema. This appears to be a recent fracture. See lumbar MRI report
from today.

Negative for fracture the sacrum or coccyx.  SI joints normal.

Regional soft tissues are normal without mass or adenopathy. No soft
tissue edema. Paraspinous muscles are symmetric and normal

Abnormal signal in the medial femoral head bilaterally left greater
than right most consistent with avascular necrosis versus
degenerative joint disease. This is incompletely evaluated on the
study.
IMPRESSION: Compression fracture inferior endplate of L4 appears recent.

Abnormal signal in the femoral head bilaterally consistent with
avascular necrosis versus degenerative joint disease.

## 2018-07-13 DIAGNOSIS — E119 Type 2 diabetes mellitus without complications: Secondary | ICD-10-CM | POA: Diagnosis not present

## 2018-07-13 DIAGNOSIS — H35033 Hypertensive retinopathy, bilateral: Secondary | ICD-10-CM | POA: Diagnosis not present

## 2018-07-13 DIAGNOSIS — H40013 Open angle with borderline findings, low risk, bilateral: Secondary | ICD-10-CM | POA: Diagnosis not present

## 2018-07-13 DIAGNOSIS — H353132 Nonexudative age-related macular degeneration, bilateral, intermediate dry stage: Secondary | ICD-10-CM | POA: Diagnosis not present

## 2018-07-27 DIAGNOSIS — N312 Flaccid neuropathic bladder, not elsewhere classified: Secondary | ICD-10-CM | POA: Diagnosis not present

## 2018-08-03 ENCOUNTER — Encounter (INDEPENDENT_AMBULATORY_CARE_PROVIDER_SITE_OTHER): Payer: Medicare Other | Admitting: Ophthalmology

## 2018-08-03 DIAGNOSIS — H353132 Nonexudative age-related macular degeneration, bilateral, intermediate dry stage: Secondary | ICD-10-CM

## 2018-08-03 DIAGNOSIS — D3132 Benign neoplasm of left choroid: Secondary | ICD-10-CM | POA: Diagnosis not present

## 2018-08-03 DIAGNOSIS — H43813 Vitreous degeneration, bilateral: Secondary | ICD-10-CM

## 2018-08-03 LAB — HM DIABETES EYE EXAM

## 2018-08-27 NOTE — Progress Notes (Signed)
Chief Complaint  Patient presents with  . Annual Exam    HPI: [name] 85 y.o. comes in today for  yearly Chronic disease management  .Since last visit.   DM: Has not been checking her blood sugar and is eating more sweets than usual but feels fine no low blood sugar symptoms no falling no new neuropathy symptoms and vision is about the same  Wears hearing aids when she needs to.  Blood pressure has been okay  Takes gabapentin at night for her legs possibly restless legs every night and needs a refill denies any serious side effects  Lipids taking Lipitor daily.  Furosemide half a tablet 20 mg but only taking 2-3 times a week as she feels needed for swelling in her legs.  Taking over-the-counter potassium about 3 times a week.  Taking vitamin C during the winter to avoid getting a cold.  Is taking some type of vitamin D 1 a day not sure how many units. Taking a baby aspirin once a day.  No side effects. Sees follow-up surgeon and oncologist for breast cancer stable.  Health Maintenance  Topic Date Due  . OPHTHALMOLOGY EXAM  06/23/2018  . FOOT EXAM  08/26/2018  . HEMOGLOBIN A1C  08/26/2018  . URINE MICROALBUMIN  08/26/2018  . DEXA SCAN  09/05/2028 (Originally 02/20/1997)  . TETANUS/TDAP  01/12/2023  . INFLUENZA VACCINE  Completed  . PNA vac Low Risk Adult  Completed   Health Maintenance Review LIFESTYLE:  Exercise: Wears ago uses a cane ambulatory no major exercise Tobacco/ETS:n Alcohol: n Sugar beverages:n Sleep: Has 2 naps a day sleeps pretty well now.  See scanned document. Drug use: no HH:2 See scanned document   Hearing: Uses hearing aids  Vision:  No limitations at present . Last eye check UTD Dr. Herbert Deaner  Safety:  Has smoke detector and wears seat belts.  . No excess sun exposure. Sees dentist regularly.  Falls: nA  Memory: Felt to be good  , no concern from her or her family.  Depression: No anhedonia unusual crying or depressive  symptoms  Nutrition: Eats well balanced diet; adequate calcium and vitamin D. No swallowing chewing problems.  Injury: no major injuries in the last six months.  Other healthcare providers:  Reviewed today .  Preventive parameters: up-to-date  Reviewed   ADLS:   There are no problems or need for assistance feeding, obtaining food, dressing, toileting and bathing, managing money using phone  ROS: Itching area mid back no unusual rashes comes off and on.  Complains of passing a lot of gas or went without good reason without pain weight loss unusual odor.  Also states that her urine has an odor recently but no dysuria or UTI symptoms to check. GEN/ HEENT: No fever, significant weight changes sweats headaches vision problems hearing changes, CV/ PULM; No chest pain shortness of breath cough, syncope,edema  change in exercise tolerance. GI /GU: No adominal pain, vomiting, change in bowel habits. No blood in the stool. No  New significant GU symptoms. SKIN/HEME: ,no acute skin rashes suspicious lesions or bleeding. No lymphadenopathy, nodules, masses.  NEURO/ PSYCH:  No neurologic signs such as weakness numbness. No depression anxiety. IMM/ Allergy: No unusual infections.  Allergy .   REST of 12 system review negative except as per HPI   Past Medical History:  Diagnosis Date  . Anemia   . Arthritis   . Bilateral edema of lower extremity   . 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 Chronic lower back pain   . CKD (chronic kidney disease) stage 3, GFR 30-59 ml/min (HCC)   . DDD (degenerative disc disease)    lumbar  . Diverticulosis   . Family history of breast cancer   . Family history of pancreatic cancer   . Family history of prostate cancer   . Flaccid neuropathic bladder, not  elsewhere classified   . Foley catheter in place   . GERD (gastroesophageal reflux disease)   . Hemorrhoids   . Hiatal hernia   . History of colon polyps   . History of radiation therapy 09-12-2015 to 10-12-2015   42.72 gray in 16 fractions directed right breast w/ boost of 12 gray in 6 fractions directed at the lumpectomy cavity- Total dose: 54.72y  . History of recurrent UTIs   . Hyperlipidemia   . PONV (postoperative nausea and vomiting)   . RBBB (right bundle branch block with left anterior fascicular block)   . Type 2 diabetes mellitus ([place])   . Urinary retention with incomplete bladder emptying   . Wears dentures    full upper and lower partial  . Wears glasses   . Wears hearing aid    bilateral but does not wear    Family History  Problem Relation Age of Onset  . Pancreatic cancer Mother 12  . Diabetes Mother   . Prostate cancer Brother   . Diabetes Father   . Heart disease Father   . Diabetes Brother   . Heart attack Brother   . Diabetes Sister   . Diabetes Daughter   . Diabetes Son   . Coronary artery disease Brother        MI in his 71s  . Colon cancer Neg Hx   . Stomach cancer Neg Hx     Social History   Socioeconomic History  . Marital status: Married    Spouse name: Not on file  . Number of children: 2  . Years of education: Not on file  . Highest education level: Not on file  Occupational History  . Occupation: Retired  Scientific laboratory technician  . Financial resource strain: Not on file  . Food insecurity:    Worry: Not on file    Inability: Not on file  . Transportation needs:    Medical: Not on file    Non-medical: Not on file  Tobacco Use  . Smoking status: Never Smoker  . Smokeless tobacco: Never Used  Substance and Sexual Activity  . Alcohol use: No  . Drug use: No  . Sexual activity: Never  Lifestyle  . Physical activity:    Days per week: Not on file    Minutes per session: Not on file  . Stress: Not on file  Relationships  . Social  connections:    Talks on phone: Not on file    Gets together: Not on file    Attends religious service: Not on file    Active member of club or organization: Not on file    Attends meetings of clubs or organizations: Not on file    Relationship status: Not on file  Other Topics Concern  . Not on file  Social History Narrative   HH 2 married   Works for [employer] for 40 years retired  No pets    Lives w spouse; married in 21; 51 years     Outpatient Encounter Medications as of 08/28/2018  Medication Sig  . acetaminophen (TYLENOL) 500 MG tablet Take 500 mg by mouth every 6 (six) hours as needed for mild pain.  Marland Kitchen aspirin EC 81 MG tablet Take 81 mg by mouth daily.  Marland Kitchen atorvastatin (LIPITOR) 10 MG tablet Take 1 tablet (10 mg total) by mouth daily.  . Blood Glucose Monitoring Suppl (ACCU-CHEK AVIVA PLUS) w/Device KIT Check blood sugar TID-QID  . Calcium Citrate-Vitamin D (CALCIUM CITRATE +D PO) Take 630 mg by mouth 2 (two) times daily. Vit d3 is 500iu  . Cholecalciferol (VITAMIN D3) 2000 units TABS Take by mouth every morning.  . furosemide (LASIX) 40 MG tablet TAKE 1/2 TABLET (20 MG     TOTAL) DAILY  . gabapentin (NEURONTIN) 300 MG capsule TAKE 1 CAPSULE(300 MG) BY MOUTH AT BEDTIME  . glipiZIDE (GLUCOTROL XL) 5 MG 24 hr tablet Take 1 tablet (5 mg total) by mouth daily.  Marland Kitchen glucose blood (ACCU-CHEK AVIVA PLUS) test strip Use TID-QID to check blood sugars.  Glory Rosebush VERIO test strip   . Potassium 99 MG TABS Take 1 tablet by mouth every morning.  . vitamin C (ASCORBIC ACID) 500 MG tablet Take 500 mg by mouth daily.  . [DISCONTINUED] atorvastatin (LIPITOR) 10 MG tablet TAKE 1 TABLET DAILY  . [DISCONTINUED] furosemide (LASIX) 40 MG tablet TAKE 1/2 TABLET (20 MG     TOTAL) DAILY  . [DISCONTINUED] gabapentin (NEURONTIN) 300 MG capsule TAKE 1 CAPSULE(300 MG) BY MOUTH AT BEDTIME  . [DISCONTINUED] glipiZIDE (GLUCOTROL XL) 5 MG 24 hr tablet TAKE 1 TABLET DAILY   No facility-administered  encounter medications on file as of 08/28/2018.     EXAM:  BP (!) 110/58 (BP Location: Left Arm, Patient Position: Sitting, Cuff Size: Normal)   Pulse 88   Temp 97.8 F (36.6 C) (Oral)   Ht 5' 0.5" (1.537 m)   Wt 153 lb 12.8 oz (69.8 kg)   BMI 29.54 kg/m   Body mass index is 29.54 kg/m.  Physical Exam: Vital signs reviewed OPF:YTWK is a well-developed well-nourished alert cooperative   who appears stated age in no acute distress.  Is a bit hard of hearing she does not have her hearing aids in today. HEENT: normocephalic atraumatic , Eyes: PERRL EOM's full, conjunctiva clear, Nares: paten,t no deformity discharge or tenderness., Ears: no deformity EAC's clear TMs with normal landmarks. Mouth: clear OP, no lesions, edema.  Moist mucous membranes. Dentition in adequate repair. NECK: supple without masses, thyromegaly or bruits. CHEST/PULM:  Clear to auscultation and percussion breath sounds equal no wheeze , rales or rhonchi.   CV: PMI is nondisplaced, S1 S2 no gallops, murmurs, rubs. Peripheral pulses are present .No JVD.  ABDOMEN: Bowel sounds normal nontender  No guard or rebound, no hepato splenomegal no CVA tenderness.   Extremtities:  No clubbing cyanosis or edema, no acute joint swelling or redness no focal atrophy has some DJD changes and Heberden's nodes.  No acute findings. NEURO:  Oriented x3, cranial nerves 3-12 appear to be intact, no obvious focal weakness,gait within normal limits no abnormal reflexes or asymmetrical SKIN: No acute rashes normal turgor, color, no bruising or petechiae.  Scaly area and seborrheic keratosis looking mid to lower back well-healed scar midline low back. PSYCH: Oriented, good eye contact, no obvious depression anxiety, cognition and judgment appear normal.  Pleasant and normal conversation.  LN: no  cervical axillary inguinal adenopathy No noted deficits in memory, attention, and speech.   Lab Results  Component Value Date   WBC 4.2 08/28/2018     HGB 12.9 08/28/2018   HCT 38.5 08/28/2018   PLT 205.0 08/28/2018   GLUCOSE 159 (H) 08/28/2018   CHOL 172 08/28/2018   TRIG 219.0 (H) 08/28/2018   HDL 41.10 08/28/2018   LDLDIRECT 92.0 08/28/2018   LDLCALC 83 12/16/2013   ALT 12 08/28/2018   AST 11 08/28/2018   NA 141 08/28/2018   K 4.3 08/28/2018   CL 104 08/28/2018   CREATININE 1.18 08/28/2018   BUN 29 (H) 08/28/2018   CO2 31 08/28/2018   TSH 3.25 08/28/2018   INR 1.01 02/27/2016   HGBA1C 7.5 (H) 08/28/2018   MICROALBUR 5.2 (H) 08/28/2018    ASSESSMENT AND PLAN:  Discussed the following assessment and plan:  Controlled type 2 diabetes mellitus with complication, without long-term current use of insulin (HCC)  Medication management - Plan: Basic metabolic panel, CBC with Differential/Platelet, Hemoglobin A1c, Hepatic function panel, Lipid panel, TSH, POCT Urinalysis Dipstick (Automated), Microalbumin / creatinine urine ratio  Essential hypertension - Plan: Basic metabolic panel, CBC with Differential/Platelet, Hemoglobin A1c, Hepatic function panel, Lipid panel, TSH, POCT Urinalysis Dipstick (Automated), Microalbumin / creatinine urine ratio  Controlled type 2 diabetes mellitus without complication, without long-term current use of insulin (HCC) - Plan: Basic metabolic panel, CBC with Differential/Platelet, Hemoglobin A1c, Hepatic function panel, Lipid panel, TSH, POCT Urinalysis Dipstick (Automated), Microalbumin / creatinine urine ratio  Hyperlipidemia, unspecified hyperlipidemia type - Plan: Basic metabolic panel, CBC with Differential/Platelet, Hemoglobin A1c, Hepatic function panel, Lipid panel, TSH, POCT Urinalysis Dipstick (Automated), Microalbumin / creatinine urine ratio  Renal insufficiency - Plan: Basic metabolic panel, CBC with Differential/Platelet, Hemoglobin A1c, Hepatic function panel, Lipid panel, TSH, POCT Urinalysis Dipstick (Automated), Microalbumin / creatinine urine ratio  Wears hearing  aid  Osteoarthritis, unspecified osteoarthritis type, unspecified site - Plan: Basic metabolic panel, CBC with Differential/Platelet, Hemoglobin A1c, Hepatic function panel, Lipid panel, TSH, POCT Urinalysis Dipstick (Automated), Microalbumin / creatinine urine ratio  Passing gas - Plan: Basic metabolic panel, CBC with Differential/Platelet, Hemoglobin A1c, Hepatic function panel, Lipid panel, TSH, POCT Urinalysis Dipstick (Automated), Microalbumin / creatinine urine ratio  Abnormal urine odor - Plan: Urine Culture Gas may be dietary I do not think her medicines are doing it at this time. Discussed and at this time continues to decline dexa  At this time no change in medicine she is only taking the Lasix a few times a week and has no significant side effects from her diabetes medicine i.e. no hypoglycemia so will remain at this time.   Patient Care Team: Panosh, Standley Brooking, MD as PCP - General (Internal Medicine) Monna Fam, MD (Ophthalmology) Nicholas Lose, MD as Consulting Physician (Hematology and Oncology) Gery Pray, MD as Consulting Physician (Radiation Oncology) [name], MD as Consulting Physician (General Surgery) [name], MD as Consulting Physician (Urology) Delice Bison Charlestine Massed, NP as Nurse Practitioner (Hematology and Oncology)  Patient Instructions   Take care to limit the sweets .  Will notify you  of labs when available.   Try doing a lactose free diet  For a few weeks  To see if helps  Helps decrease  the gas.    Other foods possible  Shingrix vaccine  At [employer] when  Out of flu season .   Causes of excess gas  By [facility] Staff Excess upper intestinal gas can result from swallowing  more than a usual amount of air, overeating, smoking or chewing gum. Excess lower intestinal gas can be caused by eating too much of certain foods, by the inability to fully digest certain foods or by a disruption in the bacteria normally found in the  colon.  Foods that cause excess gas Foods that cause gas in one person might not cause it in another. Common gas-producing foods and substances include:  Beans and lentils Vegetables such as cabbage, broccoli, cauliflower, bok choy and brussels sprouts (cruciferous vegetables) Bran Dairy products containing lactose Fructose, which is found in some fruits and used as a sweetener in soft drinks and other products Sorbitol, a sugar substitute found in some sugar-free candies, gums and artificial sweeteners Carbonated beverages, such as soda or beer      Mariann Laster K. Panosh M.D.

## 2018-08-28 ENCOUNTER — Encounter: Payer: Self-pay | Admitting: Internal Medicine

## 2018-08-28 ENCOUNTER — Ambulatory Visit (INDEPENDENT_AMBULATORY_CARE_PROVIDER_SITE_OTHER): Payer: Medicare Other | Admitting: Internal Medicine

## 2018-08-28 VITALS — BP 110/58 | HR 88 | Temp 97.8°F | Ht 60.5 in | Wt 153.8 lb

## 2018-08-28 DIAGNOSIS — R143 Flatulence: Secondary | ICD-10-CM | POA: Diagnosis not present

## 2018-08-28 DIAGNOSIS — R8290 Unspecified abnormal findings in urine: Secondary | ICD-10-CM | POA: Diagnosis not present

## 2018-08-28 DIAGNOSIS — Z974 Presence of external hearing-aid: Secondary | ICD-10-CM | POA: Diagnosis not present

## 2018-08-28 DIAGNOSIS — N289 Disorder of kidney and ureter, unspecified: Secondary | ICD-10-CM

## 2018-08-28 DIAGNOSIS — E119 Type 2 diabetes mellitus without complications: Secondary | ICD-10-CM

## 2018-08-28 DIAGNOSIS — M1990 Unspecified osteoarthritis, unspecified site: Secondary | ICD-10-CM

## 2018-08-28 DIAGNOSIS — I10 Essential (primary) hypertension: Secondary | ICD-10-CM | POA: Diagnosis not present

## 2018-08-28 DIAGNOSIS — E118 Type 2 diabetes mellitus with unspecified complications: Secondary | ICD-10-CM

## 2018-08-28 DIAGNOSIS — Z79899 Other long term (current) drug therapy: Secondary | ICD-10-CM

## 2018-08-28 DIAGNOSIS — E785 Hyperlipidemia, unspecified: Secondary | ICD-10-CM | POA: Diagnosis not present

## 2018-08-28 DIAGNOSIS — R300 Dysuria: Secondary | ICD-10-CM | POA: Diagnosis not present

## 2018-08-28 LAB — CBC WITH DIFFERENTIAL/PLATELET
Basophils Absolute: 0.1 10*3/uL (ref 0.0–0.1)
Basophils Relative: 1.2 % (ref 0.0–3.0)
Eosinophils Absolute: 0.2 10*3/uL (ref 0.0–0.7)
Eosinophils Relative: 5.1 % — ABNORMAL HIGH (ref 0.0–5.0)
HCT: 38.5 % (ref 36.0–46.0)
Hemoglobin: 12.9 g/dL (ref 12.0–15.0)
Lymphocytes Relative: 37.1 % (ref 12.0–46.0)
Lymphs Abs: 1.6 10*3/uL (ref 0.7–4.0)
MCHC: 33.5 g/dL (ref 30.0–36.0)
MCV: 90.8 fl (ref 78.0–100.0)
Monocytes Absolute: 0.4 10*3/uL (ref 0.1–1.0)
Monocytes Relative: 10 % (ref 3.0–12.0)
Neutro Abs: 1.9 10*3/uL (ref 1.4–7.7)
Neutrophils Relative %: 46.6 % (ref 43.0–77.0)
Platelets: 205 10*3/uL (ref 150.0–400.0)
RBC: 4.24 Mil/uL (ref 3.87–5.11)
RDW: 14.1 % (ref 11.5–15.5)
WBC: 4.2 10*3/uL (ref 4.0–10.5)

## 2018-08-28 LAB — POC URINALSYSI DIPSTICK (AUTOMATED)
Bilirubin, UA: NEGATIVE
Blood, UA: NEGATIVE
Glucose, UA: NEGATIVE
Ketones, UA: NEGATIVE
Protein, UA: POSITIVE — AB
Spec Grav, UA: 1.025 (ref 1.010–1.025)
Urobilinogen, UA: 0.2 E.U./dL
pH, UA: 6 (ref 5.0–8.0)

## 2018-08-28 LAB — MICROALBUMIN / CREATININE URINE RATIO
Creatinine,U: 110.7 mg/dL
Microalb Creat Ratio: 4.7 mg/g (ref 0.0–30.0)
Microalb, Ur: 5.2 mg/dL — ABNORMAL HIGH (ref 0.0–1.9)

## 2018-08-28 LAB — LIPID PANEL
Cholesterol: 172 mg/dL (ref 0–200)
HDL: 41.1 mg/dL (ref >39.00)
NonHDL: 131.09
Total CHOL/HDL Ratio: 4
Triglycerides: 219 mg/dL — ABNORMAL HIGH (ref 0.0–149.0)
VLDL: 43.8 mg/dL — ABNORMAL HIGH (ref 0.0–40.0)

## 2018-08-28 LAB — HEPATIC FUNCTION PANEL
ALT: 12 U/L (ref 0–35)
AST: 11 U/L (ref 0–37)
Albumin: 4.3 g/dL (ref 3.5–5.2)
Alkaline Phosphatase: 83 U/L (ref 39–117)
Bilirubin, Direct: 0.1 mg/dL (ref 0.0–0.3)
Total Bilirubin: 0.5 mg/dL (ref 0.2–1.2)
Total Protein: 6.9 g/dL (ref 6.0–8.3)

## 2018-08-28 LAB — BASIC METABOLIC PANEL
BUN: 29 mg/dL — ABNORMAL HIGH (ref 6–23)
CO2: 31 mEq/L (ref 19–32)
Calcium: 9.3 mg/dL (ref 8.4–10.5)
Chloride: 104 mEq/L (ref 96–112)
Creatinine, Ser: 1.18 mg/dL (ref 0.40–1.20)
GFR: 43.38 mL/min — ABNORMAL LOW (ref >60.00)
Glucose, Bld: 159 mg/dL — ABNORMAL HIGH (ref 70–99)
Potassium: 4.3 mEq/L (ref 3.5–5.1)
Sodium: 141 mEq/L (ref 135–145)

## 2018-08-28 LAB — TSH: TSH: 3.25 u[IU]/mL (ref 0.35–4.50)

## 2018-08-28 LAB — HEMOGLOBIN A1C: Hgb A1c MFr Bld: 7.5 % — ABNORMAL HIGH (ref 4.6–6.5)

## 2018-08-28 LAB — LDL CHOLESTEROL, DIRECT: Direct LDL: 92 mg/dL

## 2018-08-28 MED ORDER — GLIPIZIDE ER 5 MG PO TB24: 5.0000 mg | ORAL_TABLET | Freq: Every day | ORAL | 3 refills | Status: DC

## 2018-08-28 MED ORDER — ATORVASTATIN CALCIUM 10 MG PO TABS: 10.0000 mg | ORAL_TABLET | Freq: Every day | ORAL | 3 refills | Status: DC

## 2018-08-28 MED ORDER — FUROSEMIDE 40 MG PO TABS: ORAL_TABLET | ORAL | 3 refills | Status: DC

## 2018-08-28 MED ORDER — GABAPENTIN 300 MG PO CAPS: ORAL_CAPSULE | ORAL | 3 refills | Status: DC

## 2018-08-28 NOTE — Patient Instructions (Addendum)
  Take care to limit the sweets .  Will notify you  of labs when available.   Try doing a lactose free diet  For a few weeks  To see if helps  Helps decrease  the gas.    Other foods possible  Shingrix vaccine  At [employer] when  Out of flu season .   Causes of excess gas  By [facility] Staff Excess upper intestinal gas can result from swallowing more than a usual amount of air, overeating, smoking or chewing gum. Excess lower intestinal gas can be caused by eating too much of certain foods, by the inability to fully digest certain foods or by a disruption in the bacteria normally found in the colon.  Foods that cause excess gas Foods that cause gas in one person might not cause it in another. Common gas-producing foods and substances include:  Beans and lentils Vegetables such as cabbage, broccoli, cauliflower, bok choy and brussels sprouts (cruciferous vegetables) Bran Dairy products containing lactose Fructose, which is found in some fruits and used as a sweetener in soft drinks and other products Sorbitol, a sugar substitute found in some sugar-free candies, gums and artificial sweeteners Carbonated beverages, such as soda or beer

## 2018-08-30 LAB — URINE CULTURE
MICRO NUMBER:: 226769
SPECIMEN QUALITY:: ADEQUATE

## 2018-08-31 ENCOUNTER — Telehealth: Payer: Self-pay | Admitting: Internal Medicine

## 2018-08-31 ENCOUNTER — Other Ambulatory Visit: Payer: Self-pay

## 2018-08-31 MED ORDER — CEFUROXIME AXETIL 250 MG PO TABS: 250.0000 mg | ORAL_TABLET | Freq: Two times a day (BID) | ORAL | 0 refills | Status: DC

## 2018-08-31 NOTE — Telephone Encounter (Signed)
Charted in result notes. 

## 2018-08-31 NOTE — Telephone Encounter (Signed)
Patient returning call for results. NOD currently unavailable. Please advise.   Copied from [place] [id number]. Topic: Quick Communication - Lab Results (Clinic Use ONLY) >> Aug 31, 2018  1:08 PM Grayling Congress, [place] wrote: Called patient to inform them of 08/28/2018 lab results. When patient returns call, triage nurse may disclose results.

## 2018-09-03 ENCOUNTER — Encounter: Payer: Self-pay | Admitting: Internal Medicine

## 2018-09-08 DIAGNOSIS — Z853 Personal history of malignant neoplasm of breast: Secondary | ICD-10-CM | POA: Diagnosis not present

## 2018-09-08 LAB — HM MAMMOGRAPHY

## 2018-09-09 DIAGNOSIS — N312 Flaccid neuropathic bladder, not elsewhere classified: Secondary | ICD-10-CM | POA: Diagnosis not present

## 2018-09-24 ENCOUNTER — Encounter: Payer: Self-pay | Admitting: Internal Medicine

## 2018-10-20 ENCOUNTER — Other Ambulatory Visit: Payer: Self-pay | Admitting: Internal Medicine

## 2018-10-21 DIAGNOSIS — N312 Flaccid neuropathic bladder, not elsewhere classified: Secondary | ICD-10-CM | POA: Diagnosis not present

## 2018-12-02 DIAGNOSIS — N312 Flaccid neuropathic bladder, not elsewhere classified: Secondary | ICD-10-CM | POA: Diagnosis not present

## 2019-01-13 DIAGNOSIS — N312 Flaccid neuropathic bladder, not elsewhere classified: Secondary | ICD-10-CM | POA: Diagnosis not present

## 2019-02-24 DIAGNOSIS — R8271 Bacteriuria: Secondary | ICD-10-CM | POA: Diagnosis not present

## 2019-02-24 DIAGNOSIS — N312 Flaccid neuropathic bladder, not elsewhere classified: Secondary | ICD-10-CM | POA: Diagnosis not present

## 2019-02-25 NOTE — Progress Notes (Signed)
Chief Complaint  Patient presents with  . Follow-up    pt is here for 6 month follow up of diabetes which she says she hasn't been checking her sugars     HPI: [name] 85 y.o. come in for Chronic disease management  A numbewof issues   DM not checking deciding to at her sweets as she wishes   [name] is high    Poss  uti   And went to urologist    checking  Urine  [name].   Every 6 weeks.    Gets tube changes but not check for infection cause doc wasn't seeing her . gaiven sample of maribel but didnt take it        Last uti .? When   hearrt burn has returned  Used to be on ppi in past   Getting frequency  And about 2 weeks ago  Had episode of pain in upper stomach up into mid chest heart butn and then felt like she couldn't breath  Lasted seconds but felt scared  And felt  Tired afterward . No ckocking cough swweating NV  Occurred after one bite of rotisserie chicken  Denies new doe other  alomse called ems  But then passed   ROS: See pertinent positives and negatives per HPI. No swelling  Taking lasix about ? As needed ?  No falling bleeding   Bug bites on right leg  Itch and sting Getting better used benadryl helped  No fever    Past Medical History:  Diagnosis Date  . Anemia   . Arthritis   . Bilateral edema of lower extremity   . 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 Chronic lower back pain   . CKD (chronic kidney disease) stage 3, GFR 30-59 ml/min (HCC)   . DDD (degenerative disc disease)    lumbar  . Diverticulosis   . Family history of breast cancer   . Family history of pancreatic cancer   . Family history of prostate cancer   . Flaccid neuropathic bladder, not elsewhere classified   . Foley catheter in place   . GERD  (gastroesophageal reflux disease)   . Hemorrhoids   . Hiatal hernia   . History of colon polyps   . History of radiation therapy 09-12-2015 to 10-12-2015   42.72 gray in 16 fractions directed right breast w/ boost of 12 gray in 6 fractions directed at the lumpectomy cavity- Total dose: 54.72y  . History of recurrent UTIs   . Hyperlipidemia   . PONV (postoperative nausea and vomiting)   . RBBB (right bundle branch block with left anterior fascicular block)   . Type 2 diabetes mellitus ([name])   . Urinary retention with incomplete bladder emptying   . Wears dentures    full upper and lower partial  . Wears glasses   . Wears hearing aid    bilateral but does not wear    Family History  Problem Relation Age of Onset  . Pancreatic cancer Mother 33  . Diabetes Mother   . Prostate cancer Brother   . Diabetes Father   . Heart disease Father   . Diabetes Brother   . Heart attack Brother   . Diabetes Sister   .  Diabetes Daughter   . Diabetes Son   . Coronary artery disease Brother        MI in his 94s  . Colon cancer Neg Hx   . Stomach cancer Neg Hx     Social History   Socioeconomic History  . Marital status: Married    Spouse name: Not on file  . Number of children: 2  . Years of education: Not on file  . Highest education level: Not on file  Occupational History  . Occupation: Retired  Scientific laboratory technician  . Financial resource strain: Not on file  . Food insecurity    Worry: Not on file    Inability: Not on file  . Transportation needs    Medical: Not on file    Non-medical: Not on file  Tobacco Use  . Smoking status: Never Smoker  . Smokeless tobacco: Never Used  Substance and Sexual Activity  . Alcohol use: No  . Drug use: No  . Sexual activity: Never  Lifestyle  . Physical activity    Days per week: Not on file    Minutes per session: Not on file  . Stress: Not on file  Relationships  . Social Herbalist on phone: Not on file    Gets together: Not on  file    Attends religious service: Not on file    Active member of club or organization: Not on file    Attends meetings of clubs or organizations: Not on file    Relationship status: Not on file  Other Topics Concern  . Not on file  Social History Narrative   HH 2 married   Works for [employer] for 40 years retired   No pets    Lives w spouse; married in 72; 36 years     Outpatient Medications Prior to Visit  Medication Sig Dispense Refill  . acetaminophen (TYLENOL) 500 MG tablet Take 500 mg by mouth every 6 (six) hours as needed for mild pain.    Ashley Kitchen aspirin EC 81 MG tablet Take 81 mg by mouth daily.    Ashley Kitchen atorvastatin (LIPITOR) 10 MG tablet Take 1 tablet (10 mg total) by mouth daily. 90 tablet 3  . Blood Glucose Monitoring Suppl (ACCU-CHEK AVIVA PLUS) w/Device KIT Check blood sugar TID-QID 1 kit 0  . Calcium Citrate-Vitamin D (CALCIUM CITRATE +D PO) Take 630 mg by mouth 2 (two) times daily. Vit d3 is 500iu    . Cholecalciferol (VITAMIN D3) 2000 units TABS Take by mouth every morning.    . furosemide (LASIX) 40 MG tablet TAKE 1/2 TABLET (20 MG     TOTAL) DAILY 45 tablet 3  . gabapentin (NEURONTIN) 300 MG capsule TAKE 1 CAPSULE(300 MG) BY MOUTH AT BEDTIME 90 capsule 3  . glipiZIDE (GLUCOTROL XL) 5 MG 24 hr tablet Take 1 tablet (5 mg total) by mouth daily. 90 tablet 3  . glucose blood (ACCU-CHEK AVIVA PLUS) test [name] Use TID-QID to check blood sugars. 100 each 12  . Multiple Vitamins-Minerals (ICAPS AREDS 2 PO) Take by mouth every morning.    Glory [name] VERIO test [name]     . Potassium 99 MG TABS Take 1 tablet by mouth every morning.    . vitamin C (ASCORBIC ACID) 500 MG tablet Take 500 mg by mouth daily.    . cefUROXime (CEFTIN) 250 MG tablet Take 1 tablet (250 mg total) by mouth 2 (two) times daily with a meal. (Patient not taking: Reported on 02/26/2019)  10 tablet 0   No facility-administered medications prior to visit.      EXAM:  BP 118/66 (BP Location: Right Arm, Patient  Position: Sitting, Cuff Size: Normal)   Pulse 67   Temp 97.6 F (36.4 C) (Temporal)   Wt 156 lb 9.6 oz (71 kg)   SpO2 (!) 89%   BMI 30.08 kg/m   Body mass index is 30.08 kg/m.  GENERAL: vitals reviewed and listed above, alert, oriented, appears well hydrated and in no acute distress HEENT: atraumatic, conjunctiva  clear, no obvious abnormalities on inspection of external nose and ears masked  NECK: no obvious masses on inspection palpation  LUNGS: clear to auscultation bilaterally, no wheezes, rales or rhonchi, good air movement CV: HRRR, no clubbing cyanosis slight    peripheral edema nl cap refill  MS: moves all extremities without noticeable focal  Abnormality skin fading bug bites righ lateral leg distal no pustules vesicles or edema PSYCH: pleasant and cooperative, no obvious depression or anxiety Lab Results  Component Value Date   WBC 4.2 08/28/2018   HGB 12.9 08/28/2018   HCT 38.5 08/28/2018   PLT 205.0 08/28/2018   GLUCOSE 159 (H) 08/28/2018   CHOL 172 08/28/2018   TRIG 219.0 (H) 08/28/2018   HDL 41.10 08/28/2018   LDLDIRECT 92.0 08/28/2018   LDLCALC 83 12/16/2013   ALT 12 08/28/2018   AST 11 08/28/2018   NA 141 08/28/2018   K 4.3 08/28/2018   CL 104 08/28/2018   CREATININE 1.18 08/28/2018   BUN 29 (H) 08/28/2018   CO2 31 08/28/2018   TSH 3.25 08/28/2018   INR 1.01 02/27/2016   HGBA1C 7.2 (A) 02/26/2019   MICROALBUR 5.2 (H) 08/28/2018   BP Readings from Last 3 Encounters:  02/26/19 118/66  08/28/18 (!) 110/58  05/15/18 121/84    ASSESSMENT AND PLAN:  Discussed the following assessment and plan:  Controlled type 2 diabetes mellitus without complication, without long-term current use of insulin (HCC) - Plan: POCT Urinalysis Dipstick (Automated), Urine Culture, POCT glycosylated hemoglobin (Hb A1C)  Medication management - Plan: POCT Urinalysis Dipstick (Automated), Urine Culture, POCT glycosylated hemoglobin (Hb A1C)  Suspected UTI - ucx and antibiotic  has neuro genic bladder - Plan: POCT Urinalysis Dipstick (Automated), Urine Culture, POCT glycosylated hemoglobin (Hb A1C)  Heartburn - Plan: POCT Urinalysis Dipstick (Automated), Urine Culture, POCT glycosylated hemoglobin (Hb A1C)  Essential hypertension - Plan: POCT Urinalysis Dipstick (Automated), Urine Culture, POCT glycosylated hemoglobin (Hb A1C)  Urinary catheter present  Atypical chest pain short lived episode  - see text ? esophageal but cannot explain feeling couldnt breath for seconds  episodes  Uncertain if all gI atypical of feelsing of cannot breath  But that lasted seconds  ?  Seek ed care if occurs  Again  begin ppi and then FU.   Phone visit in about a month or as needed  She was on Nexium in past  40 mg if doing better may dec dose to 20 mg per day  -Patient advised to return or notify health care team  if  new concerns arise. In interim   Patient Instructions  Will add back   Acid blocker because of you heart burn  acid sx. Take nexium every day  Until we recheck with eah other.  I f shortness of breath  episode returns then seek care at the ED   Plan fu  Virtual or phone viist in 3-4 weeks about the heart burn .    Antibiotic for  probable uti .   Sugar is acceptable today .      Standley Brooking. Kamilo Och M.D.

## 2019-02-26 ENCOUNTER — Other Ambulatory Visit: Payer: Self-pay

## 2019-02-26 ENCOUNTER — Ambulatory Visit (INDEPENDENT_AMBULATORY_CARE_PROVIDER_SITE_OTHER): Payer: Medicare Other | Admitting: Internal Medicine

## 2019-02-26 ENCOUNTER — Encounter: Payer: Self-pay | Admitting: Internal Medicine

## 2019-02-26 VITALS — BP 118/66 | HR 67 | Temp 97.6°F | Wt 156.6 lb

## 2019-02-26 DIAGNOSIS — Z960 Presence of urogenital implants: Secondary | ICD-10-CM | POA: Diagnosis not present

## 2019-02-26 DIAGNOSIS — I10 Essential (primary) hypertension: Secondary | ICD-10-CM | POA: Diagnosis not present

## 2019-02-26 DIAGNOSIS — R12 Heartburn: Secondary | ICD-10-CM

## 2019-02-26 DIAGNOSIS — Z79899 Other long term (current) drug therapy: Secondary | ICD-10-CM

## 2019-02-26 DIAGNOSIS — E119 Type 2 diabetes mellitus without complications: Secondary | ICD-10-CM

## 2019-02-26 DIAGNOSIS — R3989 Other symptoms and signs involving the genitourinary system: Secondary | ICD-10-CM

## 2019-02-26 DIAGNOSIS — R0789 Other chest pain: Secondary | ICD-10-CM | POA: Diagnosis not present

## 2019-02-26 LAB — POCT GLYCOSYLATED HEMOGLOBIN (HGB A1C): Hemoglobin A1C: 7.2 % — AB (ref 4.0–5.6)

## 2019-02-26 LAB — POC URINALSYSI DIPSTICK (AUTOMATED)
Bilirubin, UA: NEGATIVE
Blood, UA: POSITIVE
Glucose, UA: NEGATIVE
Ketones, UA: NEGATIVE
Nitrite, UA: POSITIVE
Protein, UA: POSITIVE — AB
Spec Grav, UA: >=1.03 — AB (ref 1.010–1.025)
Urobilinogen, UA: 0.2 E.U./dL
pH, UA: 6 (ref 5.0–8.0)

## 2019-02-26 MED ORDER — ESOMEPRAZOLE MAGNESIUM 40 MG PO CPDR: 40.0000 mg | DELAYED_RELEASE_CAPSULE | Freq: Every day | ORAL | 2 refills | Status: DC

## 2019-02-26 MED ORDER — CEFDINIR 300 MG PO CAPS: 300.0000 mg | ORAL_CAPSULE | Freq: Two times a day (BID) | ORAL | 0 refills | Status: DC

## 2019-02-26 NOTE — Patient Instructions (Addendum)
Will add back   Acid blocker because of you heart burn  acid sx. Take nexium every day  Until we recheck with eah other.  I f shortness of breath  episode returns then seek care at the ED   Plan fu  Virtual or phone viist in 3-4 weeks about the heart burn .    Antibiotic for probable uti .   Sugar is acceptable today .

## 2019-02-28 LAB — URINE CULTURE
MICRO NUMBER:: 798097
SPECIMEN QUALITY:: ADEQUATE

## 2019-03-01 ENCOUNTER — Telehealth: Payer: Self-pay | Admitting: Internal Medicine

## 2019-03-01 NOTE — Telephone Encounter (Signed)
Patient phoned for lab results. Reviewed results and physician's note with patient. Stated urologist had already phoned in the same medication but at a higher strength, 500 mg cephalexin and that is the what she is taking. She reports much improvement.

## 2019-03-01 NOTE — Progress Notes (Signed)
Tell patient that urine culture shows e coli  sensitive to medication given . Should resolve with current treatment .FU if not better. 

## 2019-03-29 ENCOUNTER — Other Ambulatory Visit: Payer: Self-pay

## 2019-03-29 ENCOUNTER — Telehealth (INDEPENDENT_AMBULATORY_CARE_PROVIDER_SITE_OTHER): Payer: Medicare Other | Admitting: Internal Medicine

## 2019-03-29 ENCOUNTER — Encounter: Payer: Self-pay | Admitting: Internal Medicine

## 2019-03-29 DIAGNOSIS — Z79899 Other long term (current) drug therapy: Secondary | ICD-10-CM | POA: Diagnosis not present

## 2019-03-29 DIAGNOSIS — R12 Heartburn: Secondary | ICD-10-CM | POA: Diagnosis not present

## 2019-03-29 NOTE — Progress Notes (Signed)
   Virtual Visit via Telephone Note  I connected with@ on 03/29/19 at 10:00 AM EDT by telephone and verified that I am speaking with the correct person using two identifiers.   I discussed the limitations, risks, security and privacy concerns of performing an evaluation and management service by telephone and the availability of in person appointments. I also discussed with the patient that there may be a patient responsible charge related to this service. The patient expressed understanding and agreed to proceed.  Location patient: home Location provider: work  office Participants present for the call: patient, provider Patient did not have a visit in the prior 7 days to address this/these issue(s).   History of Present Illness:   [name] fu  Heart butrn medication  She states that she is doing much better since her last visit she is now taking the Nexium every other day and it controls her heartburn.  No fever weight loss vomiting or dysphasia.  Was aware that it could decrease the absorption of B12 so she is taking a multivitamin midday with B12.  No side effects.  Observations/Objective: Patient sounds cheerful and well on the phone. I do not appreciate any SOB. Speech and thought processing are grossly intact.  Lab Results  Component Value Date   WBC 4.2 08/28/2018   HGB 12.9 08/28/2018   HCT 38.5 08/28/2018   PLT 205.0 08/28/2018   GLUCOSE 159 (H) 08/28/2018   CHOL 172 08/28/2018   TRIG 219.0 (H) 08/28/2018   HDL 41.10 08/28/2018   LDLDIRECT 92.0 08/28/2018   LDLCALC 83 12/16/2013   ALT 12 08/28/2018   AST 11 08/28/2018   NA 141 08/28/2018   K 4.3 08/28/2018   CL 104 08/28/2018   CREATININE 1.18 08/28/2018   BUN 29 (H) 08/28/2018   CO2 31 08/28/2018   TSH 3.25 08/28/2018   INR 1.01 02/27/2016   HGBA1C 7.2 (A) 02/26/2019   MICROALBUR 5.2 (H) 08/28/2018    Assessment and Plan:  Good response to every other day medicine Qod  Med  Taking vits  to  supplement  No alarm sx excpet that of  Age   And   Seems very well suppressed  On current regimen so can continue and fu at her planned visit in Feb 2021  Follow Up Instructions:  Keep appointment in February for follow-up of her diabetes etc.  Discussed getting flu shot she will call make appointment  For place she wishes   [id number] [id number] [id number] [id number] [id number] 21-30 I did not refer this patient for an OV in the next 24 hours for this/these issue(s).  I discussed the assessment and treatment plan with the patient. The patient was provided an opportunity to ask questions and all were answered. The patient agreed with the plan and demonstrated an understanding of the instructions.   The patient was advised to call back or seek an in-person evaluation if the symptoms worsen or if the condition fails to improve as anticipated.  I provided 11 minutes of non-face-to-face time during this encounter.   Shanon Ace, MD

## 2019-04-05 DIAGNOSIS — N312 Flaccid neuropathic bladder, not elsewhere classified: Secondary | ICD-10-CM | POA: Diagnosis not present

## 2019-04-24 ENCOUNTER — Ambulatory Visit (INDEPENDENT_AMBULATORY_CARE_PROVIDER_SITE_OTHER): Payer: Medicare Other

## 2019-04-24 ENCOUNTER — Other Ambulatory Visit: Payer: Self-pay

## 2019-04-24 DIAGNOSIS — Z23 Encounter for immunization: Secondary | ICD-10-CM

## 2019-05-17 DIAGNOSIS — R8271 Bacteriuria: Secondary | ICD-10-CM | POA: Diagnosis not present

## 2019-05-17 DIAGNOSIS — N312 Flaccid neuropathic bladder, not elsewhere classified: Secondary | ICD-10-CM | POA: Diagnosis not present

## 2019-05-20 NOTE — Progress Notes (Signed)
CLINIC:  Survivorship   REASON FOR VISIT:  Routine follow-up for history of breast cancer.   BRIEF ONCOLOGIC HISTORY:  Oncology History Overview Note      Breast cancer of upper-outer quadrant of right female breast ([place])  06/27/2015 Mammogram   New oval focal asymmetry in right breast, posterior depth superior region   07/19/2015 Initial Diagnosis   Right breast biopsy: Invasive ductal carcinoma grade 2-3, PR 0%, PR 0%, Ki-67 20%, HER-2 negative ratio 1.77; 5 mm abnormality at 10:00 position   07/19/2015 Clinical Stage   Stage IA: T1b N0   08/02/2015 Procedure   Breast/Ovarian panel (geneDx) no deleterious mutations at ATM, BARD1, BRCA1, BRCA2, BRIP1, CDH1, CHEK2, EPCAM, FANCC, MLH1, MSH2, MSH6, NBN, PALB2, PMS2, PTEN, RAD51C, RAD51D, TP53, and XRCC2   08/03/2015 Surgery   Rt. Lumpectomy: IDC, 1.2 cm, Grade 3, with DCIS (in situ margin 0.1 cm), LN not taken, Er 0%, PR 0%, Her 2 Neg equivocal on FISH (ratio 1.93),  Negative for HER2 on IHC.   08/03/2015 Pathologic Stage   Stage IA: T1c Nx   09/13/2015 - 10/12/2015 Radiation Therapy   Adjuvant radiation therapy: 42.72 gray in 16 fractions directed right breast with a boost of 12 gray in 6 fractions directed at the lumpectomy cavity. Total dose: 54.72 Gy   12/21/2015 Survivorship   SCP visit completed      INTERVAL HISTORY:  Ms. Arca presents to the [facility] today for routine follow-up for her history of breast cancer.  Since her last visit, on 09/08/2018 she underwent bilateral 3d diagnostic mammogram that showed no evidence of malignancy and breast density category C.    Kushi is feeling well today and is without any issues.  She has difficulty with exercise regularly, but remains as active as possible.  This is due to chronic back pain from her back surgery that happened the same year she was diagnosed with breast cancer.  She has graduated from colon and gyn cancer screening.    Cheralyn continues to see her PCP  regularly.  She does not have someone check her skin.     REVIEW OF SYSTEMS:  Review of Systems  Constitutional: Negative for appetite change, chills, fatigue, fever and unexpected weight change.  HENT:   Negative for hearing loss and lump/mass.   Eyes: Negative for eye problems and icterus.  Respiratory: Negative for chest tightness, cough and shortness of breath.   Cardiovascular: Negative for chest pain, leg swelling and palpitations.  Gastrointestinal: Negative for abdominal distention, abdominal pain, constipation, diarrhea, nausea and vomiting.  Endocrine: Negative for hot flashes.  Skin: Negative for itching and rash.  Neurological: Negative for dizziness, extremity weakness, headaches and numbness.  Hematological: Negative for adenopathy. Does not bruise/bleed easily.  Psychiatric/Behavioral: Negative for depression. The patient is not nervous/anxious.   Breast: Denies any new nodularity, masses, tenderness, nipple changes, or nipple discharge.       PAST MEDICAL/SURGICAL HISTORY:  Past Medical History:  Diagnosis Date  . Anemia   . Arthritis   . Bilateral edema of lower extremity   . Breast cancer of upper-outer quadrant of right female breast [facility]) dx 07/19/2015--- oncologist-  dr Lindi Adie dr kinard   DCIS,  grade 3, 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 , HER2/neu negative-- s/p right lumpectomy (without SLNB) and radiation therapy  . Chronic lower back pain   . CKD (chronic kidney disease) stage 3, GFR 30-59 ml/min   . DDD (degenerative disc disease)    lumbar  .  Diverticulosis   . Family history of breast cancer   . Family history of pancreatic cancer   . Family history of prostate cancer   . Flaccid neuropathic bladder, not elsewhere classified   . Foley catheter in place   . GERD (gastroesophageal reflux disease)   . Hemorrhoids   . Hiatal hernia   . History of colon polyps   . History of radiation therapy 09-12-2015 to 10-12-2015   42.72 gray in 16 fractions  directed right breast w/ boost of 12 gray in 6 fractions directed at the lumpectomy cavity- Total dose: 54.72y  . History of recurrent UTIs   . Hyperlipidemia   . PONV (postoperative nausea and vomiting)   . RBBB (right bundle branch block with left anterior fascicular block)   . Type 2 diabetes mellitus ([place])   . Urinary retention with incomplete bladder emptying   . Wears dentures    full upper and lower partial  . Wears glasses   . Wears hearing aid    bilateral but does not wear   Past Surgical History:  Procedure Laterality Date  . BREAST LUMPECTOMY WITH RADIOACTIVE SEED LOCALIZATION Right 08/03/2015   Procedure: BREAST LUMPECTOMY WITH RADIOACTIVE SEED LOCALIZATION;  Surgeon: [name], MD;  Location: [place];  Service: General;  Laterality: Right;  . CARDIOVASCULAR STRESS TEST  01/14/2012   Low risk nuclear study w/ small mild apical and apical lateral reversible perfusion defect represents a small area of ischemia versus shifting breast artifact/  normal LV funciton and wall motion, ef 86%  . CARPAL TUNNEL RELEASE Right 1977  . CATARACT EXTRACTION W/ INTRAOCULAR LENS  IMPLANT, BILATERAL Bilateral left 1996/  right 1997  . CLOSED RIGHT KNEE MANIPULATION  08/11/2002   post TKA  . CYSTOSTOMY N/A 05/17/2016   Procedure: CYSTOSCOPY WITH  SUPRAPUBIC PLACMENT;  Surgeon: [name], MD;  Location: [facility];  Service: Urology;  Laterality: N/A;  . GANGLION CYST EXCISION Right 12/09/2001   right palm  . KNEE ARTHROSCOPY Right 12/14/2002   w/ Lysis Adhesions  . LUMBAR LAMINECTOMY/DECOMPRESSION MICRODISCECTOMY N/A 03/06/2016   Procedure: CENTRAL DECOMPRESSION L3 - L4 ,L4 - L5;  Surgeon: [name], MD;  Location: [facility];  Service: Orthopedics;  Laterality: N/A;  . SHOULDER HEMI-ARTHROPLASTY Right 02/20/2009   avascular necrosis   . TOTAL KNEE ARTHROPLASTY Bilateral right 06-23-2002/  left 07-11-2008  . [place]  .  VAGINAL HYSTERECTOMY  1975     ALLERGIES:  No Known Allergies   CURRENT MEDICATIONS:  Outpatient Encounter Medications as of 05/21/2019  Medication Sig  . acetaminophen (TYLENOL) 500 MG tablet Take 500 mg by mouth every 6 (six) hours as needed for mild pain.  Marland Kitchen aspirin EC 81 MG tablet Take 81 mg by mouth daily.  Marland Kitchen atorvastatin (LIPITOR) 10 MG tablet Take 1 tablet (10 mg total) by mouth daily.  . Blood Glucose Monitoring Suppl (ACCU-CHEK AVIVA PLUS) w/Device KIT Check blood sugar TID-QID  . Calcium Citrate-Vitamin D (CALCIUM CITRATE +D PO) Take 630 mg by mouth 2 (two) times daily. Vit d3 is 500iu  . cefdinir (OMNICEF) 300 MG capsule Take 1 capsule (300 mg total) by mouth 2 (two) times daily. For uti  . Cholecalciferol (VITAMIN D3) 2000 units TABS Take by mouth every morning.  Marland Kitchen esomeprazole (NEXIUM) 40 MG capsule Take 1 capsule (40 mg total) by mouth daily.  . furosemide (LASIX) 40 MG tablet TAKE 1/2 TABLET (20 MG     TOTAL)  DAILY  . gabapentin (NEURONTIN) 300 MG capsule TAKE 1 CAPSULE(300 MG) BY MOUTH AT BEDTIME  . glipiZIDE (GLUCOTROL XL) 5 MG 24 hr tablet Take 1 tablet (5 mg total) by mouth daily.  Marland Kitchen glucose blood (ACCU-CHEK AVIVA PLUS) test strip Use TID-QID to check blood sugars.  . Multiple Vitamins-Minerals (ICAPS AREDS 2 PO) Take by mouth every morning.  Glory Rosebush VERIO test strip   . Potassium 99 MG TABS Take 1 tablet by mouth every morning.  . vitamin C (ASCORBIC ACID) 500 MG tablet Take 500 mg by mouth daily.   No facility-administered encounter medications on file as of 05/21/2019.      ONCOLOGIC FAMILY HISTORY:  Family History  Problem Relation Age of Onset  . Pancreatic cancer Mother 49  . Diabetes Mother   . Prostate cancer Brother   . Diabetes Father   . Heart disease Father   . Diabetes Brother   . Heart attack Brother   . Diabetes Sister   . Diabetes Daughter   . Diabetes Son   . Coronary artery disease Brother        MI in his 68s  . Colon cancer Neg  Hx   . Stomach cancer Neg Hx     GENETIC COUNSELING/TESTING: See above  SOCIAL HISTORY:  TAUNIA FRASCO is married and lives with her husband in [place], [place] (updated in 05/21/2019 and unchanged).  She has 1 daughter who lives in [place], [place].  Ms. Umana is currently retired.  She denies any current or history of tobacco, alcohol, or illicit drug use.     PHYSICAL EXAMINATION:  Vital Signs: Vitals:   05/21/19 1051  BP: (!) 136/46  Pulse: 94  Resp: 18  Temp: 98.3 F (36.8 C)  SpO2: 96%   Filed Weights   05/21/19 1051  Weight: 158 lb 1.6 oz (71.7 kg)   General: Well-nourished, well-appearing female in no acute distress.  Unaccompanied today.   HEENT: Head is normocephalic.  Pupils equal and reactive to light. Conjunctivae clear without exudate.  Sclerae anicteric. Oral mucosa is pink, moist.  Oropharynx is pink without lesions or erythema.  Lymph: No cervical, supraclavicular, or infraclavicular lymphadenopathy noted on palpation.  Cardiovascular: Regular rate and rhythm.Marland Kitchen Respiratory: Clear to auscultation bilaterally. Chest expansion symmetric; breathing non-labored.  Breast Exam:  -Left breast: No appreciable masses on palpation. No skin redness, thickening, or peau d'orange appearance; no nipple retraction or nipple discharge;  -Right breast: No appreciable masses on palpation. No skin redness, thickening, or peau d'orange appearance; no nipple retraction or nipple discharge; mild distortion in symmetry at previous lumpectomy site well healed scar without erythema or nodularity. -Axilla: No axillary adenopathy bilaterally.  GI: Abdomen soft and round; non-tender, non-distended. Bowel sounds normoactive. No hepatosplenomegaly.   GU: Deferred.  Neuro: No focal deficits. Steady gait.  Psych: Mood and affect normal and appropriate for situation.  MSK: No focal spinal tenderness to palpation, full range of motion in bilateral upper extremities Extremities: No  edema. Skin: Warm and dry.  LABORATORY DATA:  None for this visit   DIAGNOSTIC IMAGING:     ASSESSMENT AND PLAN:  Ms.. Blackson is a pleasant 85 y.o. female with history of Stage IA right breast invasive ductal carcinoma, ER-/PR-/HER2-, diagnosed in 07/2015, treated with lumpectomy, adjuvant radiation therapy.  She presents to the Survivorship Clinic for surveillance and routine follow-up.   1. History of breast cancer:  Ms. Mendia is currently clinically and radiographically without evidence of disease or  recurrence of breast cancer. She will be due for mammogram in 09/2019.   She will f/u with Dr. Donne Hazel in 6 months, she will see me back in one year.  I encouraged her to call me with any questions or concerns before her next visit at the cancer center, and I would be happy to see her sooner, if needed.    2. Bone health:  Given Ms. Heavrin's age, history of breast cancer, she is at risk for bone demineralization. Her last DEXA scan was "years ago" and she says the results are okay.  She says she doesn't want repeat testing done.  She was given education on specific food and activities to promote bone health.  3. Cancer screening:  Due to Ms. Hojnacki's history and her age, she should receive screening for skin cancers. She was encouraged to follow-up with her PCP for appropriate cancer screenings.   4. Health maintenance and wellness promotion: Ms. Kapfer was encouraged to consume 5-7 servings of fruits and vegetables per day. She was also encouraged to engage in moderate to vigorous exercise for 30 minutes per day most days of the week. She was instructed to limit her alcohol consumption and continue to abstain from tobacco use.  Dispo:  -Return to cancer center in one year with LTS follow up -F/u with Dr. Donne Hazel in 6 months -Mammogram 09/2019   A total of (20) minutes of face-to-face time was spent with this patient with greater than 50% of that time in counseling and  care-coordination.   Gardenia Phlegm, NP Survivorship Program [facility] [phone]   Note: PRIMARY CARE PROVIDER Burnis Medin, [place] [phone]

## 2019-05-21 ENCOUNTER — Other Ambulatory Visit: Payer: Self-pay

## 2019-05-21 ENCOUNTER — Encounter: Payer: Self-pay | Admitting: Adult Health

## 2019-05-21 ENCOUNTER — Inpatient Hospital Stay: Payer: Medicare Other | Attending: Adult Health | Admitting: Adult Health

## 2019-05-21 VITALS — BP 136/46 | HR 94 | Temp 98.3°F | Resp 18 | Ht 60.5 in | Wt 158.1 lb

## 2019-05-21 DIAGNOSIS — Z171 Estrogen receptor negative status [ER-]: Secondary | ICD-10-CM

## 2019-05-21 DIAGNOSIS — C50411 Malignant neoplasm of upper-outer quadrant of right female breast: Secondary | ICD-10-CM | POA: Diagnosis not present

## 2019-05-21 DIAGNOSIS — Z853 Personal history of malignant neoplasm of breast: Secondary | ICD-10-CM | POA: Insufficient documentation

## 2019-05-21 NOTE — Patient Instructions (Signed)

## 2019-05-24 ENCOUNTER — Telehealth: Payer: Self-pay | Admitting: Adult Health

## 2019-05-24 NOTE — Telephone Encounter (Signed)
I could not reach patient regarding schedule  °

## 2019-08-09 DIAGNOSIS — N312 Flaccid neuropathic bladder, not elsewhere classified: Secondary | ICD-10-CM | POA: Diagnosis not present

## 2019-08-13 ENCOUNTER — Other Ambulatory Visit: Payer: Self-pay | Admitting: Internal Medicine

## 2019-08-30 NOTE — Progress Notes (Signed)
Chief Complaint  Patient presents with  . Back Pain    Pt having lower back pain on right side that she states radiates to her groin and down her leg believes to be hip pain and worse when laying down     HPI: [name] 86 y.o. come in for Chronic disease management  And new problem   DM not checking  Taking glipizide  No lows noteed  BP seems to be ok   Swelling some in legs taking lasix and potass qod comes and goes  No change in exercise tolerance   but having left groin back leg pain for about 6 weeks limiting walking and  Bad when lays down  No fall or hx of same  Radiates to groin left and then down leg  Takes gabapentin not sure if helps  But doesn't sleep as wel if off  HLD needs refills  atorva    Get urinary cath changed every 3 weeks at the uro but never sees the MD  For her neurogenic bladder  Asks for rx of topical steroid used in past for hand dermatis as needed  Has old rx clobetasol 0.5   ROS: See pertinent positives and negatives per HPI.  Past Medical History:  Diagnosis Date  . Anemia   . Arthritis   . Bilateral edema of lower extremity   . Breast cancer of upper-outer quadrant of right female breast [facility]) dx 07/19/2015--- oncologist-  dr Lindi Adie dr kinard   DCIS,  grade 3, 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 , HER2/neu negative-- s/p right lumpectomy (without SLNB) and radiation therapy  . Chronic lower back pain   . CKD (chronic kidney disease) stage 3, GFR 30-59 ml/min   . DDD (degenerative disc disease)    lumbar  . Diverticulosis   . Family history of breast cancer   . Family history of pancreatic cancer   . Family history of prostate cancer   . Flaccid neuropathic bladder, not elsewhere classified   . Foley catheter in place   . GERD (gastroesophageal reflux disease)   . Hemorrhoids   . Hiatal hernia   . History of colon polyps   . History of radiation therapy 09-12-2015 to 10-12-2015   42.72 gray in 16 fractions directed right breast w/  boost of 12 gray in 6 fractions directed at the lumpectomy cavity- Total dose: 54.72y  . History of recurrent UTIs   . Hyperlipidemia   . PONV (postoperative nausea and vomiting)   . RBBB (right bundle branch block with left anterior fascicular block)   . Type 2 diabetes mellitus ([place])   . Urinary retention with incomplete bladder emptying   . Wears dentures    full upper and lower partial  . Wears glasses   . Wears hearing aid    bilateral but does not wear    Family History  Problem Relation Age of Onset  . Pancreatic cancer Mother 28  . Diabetes Mother   . Prostate cancer Brother   . Diabetes Father   . Heart disease Father   . Diabetes Brother   . Heart attack Brother   . Diabetes Sister   . Diabetes Daughter   . Diabetes Son   . Coronary artery disease Brother        MI in his 44s  . Colon cancer Neg Hx   . Stomach cancer Neg Hx     Social History   Socioeconomic History  . Marital status: Married  Spouse name: Not on file  . Number of children: 2  . Years of education: Not on file  . Highest education level: Not on file  Occupational History  . Occupation: Retired  Tobacco Use  . Smoking status: Never Smoker  . Smokeless tobacco: Never Used  Substance and Sexual Activity  . Alcohol use: No  . Drug use: No  . Sexual activity: Never  Other Topics Concern  . Not on file  Social History Narrative   HH 2 married   Works for [employer] for 40 years retired   No pets    Lives w spouse; married in 83; 23 years    Social Determinants of Health   Financial Resource Strain:   . Difficulty of Paying Living Expenses: Not on file  Food Insecurity:   . Worried About Charity fundraiser in the Last Year: Not on file  . Ran Out of Food in the Last Year: Not on file  Transportation Needs:   . Lack of Transportation (Medical): Not on file  . Lack of Transportation (Non-Medical): Not on file  Physical Activity:   . Days of Exercise per Week: Not on file  .  Minutes of Exercise per Session: Not on file  Stress:   . Feeling of Stress : Not on file  Social Connections:   . Frequency of Communication with Friends and Family: Not on file  . Frequency of Social Gatherings with Friends and Family: Not on file  . Attends Religious Services: Not on file  . Active Member of Clubs or Organizations: Not on file  . Attends Archivist Meetings: Not on file  . Marital Status: Not on file    Outpatient Medications Prior to Visit  Medication Sig Dispense Refill  . acetaminophen (TYLENOL) 500 MG tablet Take 500 mg by mouth every 6 (six) hours as needed for mild pain.    Marland Kitchen aspirin EC 81 MG tablet Take 81 mg by mouth daily.    . Blood Glucose Monitoring Suppl (ACCU-CHEK AVIVA PLUS) w/Device KIT Check blood sugar TID-QID 1 kit 0  . Calcium Citrate-Vitamin D (CALCIUM CITRATE +D PO) Take 630 mg by mouth 2 (two) times daily. Vit d3 is 500iu    . Cholecalciferol (VITAMIN D3) 2000 units TABS Take by mouth every morning.    Marland Kitchen esomeprazole (NEXIUM) 40 MG capsule TAKE 1 CAPSULE(40 MG) BY MOUTH DAILY 30 capsule 2  . glucose blood (ACCU-CHEK AVIVA PLUS) test strip Use TID-QID to check blood sugars. 100 each 12  . ONETOUCH VERIO test strip     . Potassium 99 MG TABS Take 1 tablet by mouth every morning.    . vitamin C (ASCORBIC ACID) 500 MG tablet Take 500 mg by mouth daily.    Marland Kitchen atorvastatin (LIPITOR) 10 MG tablet Take 1 tablet (10 mg total) by mouth daily. 90 tablet 3  . furosemide (LASIX) 40 MG tablet TAKE 1/2 TABLET (20 MG     TOTAL) DAILY (Patient taking differently: TAKE 1/2 TABLET (20 MG) 2 times a week) 45 tablet 3  . gabapentin (NEURONTIN) 300 MG capsule TAKE 1 CAPSULE(300 MG) BY MOUTH AT BEDTIME 90 capsule 3  . glipiZIDE (GLUCOTROL XL) 5 MG 24 hr tablet Take 1 tablet (5 mg total) by mouth daily. 90 tablet 3   No facility-administered medications prior to visit.     EXAM:  BP 118/66 (BP Location: Right Arm, Patient Position: Sitting, Cuff Size:  Normal)   Pulse 96   Temp  97.6 F (36.4 C) (Temporal)   Wt 161 lb 3.2 oz (73.1 kg)   SpO2 94%   BMI 30.96 kg/m   Body mass index is 30.96 kg/m.  GENERAL: vitals reviewed and listed above, alert, oriented, appears well hydrated and in no acute distress HEENT: atraumatic, conjunctiva  clear, no obvious abnormalities on inspection of external nose and ears OP : masked  NECK: no obvious masses on inspection palpation  LUNGS: clear to auscultation bilaterally, no wheezes, rales or rhonchi, good air movement CV: HRRR, no clubbing cyanosis mild edema and vv  No reedness feet look good   peripheral edema nl cap refill  MS: moves all extremities gait antalgic limited  Bent over and favoring her left hip  PSYCH: pleasant and cooperative, no obvious depression or anxiety Diabetic Foot Exam - Simple   Simple Foot Form Diabetic Foot exam was performed with the following findings: Yes 08/31/2019 10:11 AM  Visual Inspection No deformities, no ulcerations, no other skin breakdown bilaterally: Yes Sensation Testing Intact to touch and monofilament testing bilaterally: Yes Pulse Check Posterior Tibialis and Dorsalis pulse intact bilaterally: Yes Comments soome thickened nails skin smooth     Lab Results  Component Value Date   WBC 4.2 08/28/2018   HGB 12.9 08/28/2018   HCT 38.5 08/28/2018   PLT 205.0 08/28/2018   GLUCOSE 159 (H) 08/28/2018   CHOL 172 08/28/2018   TRIG 219.0 (H) 08/28/2018   HDL 41.10 08/28/2018   LDLDIRECT 92.0 08/28/2018   LDLCALC 83 12/16/2013   ALT 12 08/28/2018   AST 11 08/28/2018   NA 141 08/28/2018   K 4.3 08/28/2018   CL 104 08/28/2018   CREATININE 1.18 08/28/2018   BUN 29 (H) 08/28/2018   CO2 31 08/28/2018   TSH 3.25 08/28/2018   INR 1.01 02/27/2016   HGBA1C 7.2 (A) 02/26/2019   MICROALBUR 5.2 (H) 08/28/2018   BP Readings from Last 3 Encounters:  08/31/19 118/66  05/21/19 (!) 136/46  02/26/19 118/66    ASSESSMENT AND PLAN:  Discussed the  following assessment and plan:  Controlled type 2 diabetes mellitus without complication, without long-term current use of insulin (HCC) - Plan: Basic metabolic panel, CBC with Differential/Platelet, Hemoglobin A1c, Hepatic function panel, Lipid panel, Microalbumin / creatinine urine ratio  Medication management - Plan: Basic metabolic panel, CBC with Differential/Platelet, Hemoglobin A1c, Hepatic function panel, Lipid panel, Microalbumin / creatinine urine ratio  Essential hypertension - Plan: Basic metabolic panel, CBC with Differential/Platelet, Hemoglobin A1c, Hepatic function panel, Lipid panel, Microalbumin / creatinine urine ratio  Urinary catheter present - Plan: Basic metabolic panel, CBC with Differential/Platelet, Hemoglobin A1c, Hepatic function panel, Lipid panel, Microalbumin / creatinine urine ratio  Renal insufficiency - Plan: Basic metabolic panel, CBC with Differential/Platelet, Hemoglobin A1c, Hepatic function panel, Lipid panel, Microalbumin / creatinine urine ratio  Wears hearing aid  Groin pain, left - new advise orthoe val  concern for hip pathology - Plan: Basic metabolic panel, CBC with Differential/Platelet, Hemoglobin A1c, Hepatic function panel, Lipid panel, Microalbumin / creatinine urine ratio, C-reactive protein, Ambulatory referral to Orthopedic Surgery  Hip pain, left - Plan: Basic metabolic panel, CBC with Differential/Platelet, Hemoglobin A1c, Hepatic function panel, Lipid panel, Microalbumin / creatinine urine ratio, C-reactive protein, Ambulatory referral to Orthopedic Surgery  Swelling - Plan: Basic metabolic panel, CBC with Differential/Platelet, Hemoglobin A1c, Hepatic function panel, Lipid panel, Microalbumin / creatinine urine ratio, C-reactive protein  Rash of hand - Plan: Basic metabolic panel, CBC with Differential/Platelet, Hemoglobin A1c, Hepatic function panel,  Lipid panel, Microalbumin / creatinine urine ratio Over due for all blood work   Left  side pain and leg problematic and  Concern about hip  Sh has hx of  Back surgery stenosis but this seems to be different  hasn't been following bg as in past   But taking medication She can try taking furosemide evey day for 2 weeks with potassium on thise days to see if swelling better     cv exam ok today  Refill atorva furos and glipizide  Will rx steroid cream with caveats and gabapentin -Patient advised to return or notify health care team  if  new concerns arise. Outside external source  DATA REVIEWED:  ehr uro  Independent historian:  Total time on date  of service including record review ordering and plan of caretime with patient : 45 minutes  Return for depending on results  4-6 months .  Patient Instructions   Referring to orthopedics  About pain  Left groin think it could be a hip problem  You can also call for appt.  Can take tylenol 500 mg twice a day in interim .   Will notify you  of labs when available.   Heart sounds good   So far  Will refill the meds   And the clobetosol  For and  rash as needed  If swelling gets worse we  May take extra lasix  Dose if needed   Plan follo wup depending on  Labs and how doing   Wt Readings from Last 3 Encounters:  08/31/19 161 lb 3.2 oz (73.1 kg)  05/21/19 158 lb 1.6 oz (71.7 kg)  02/26/19 156 lb 9.6 oz (71 kg)    BP Readings from Last 3 Encounters:  08/31/19 118/66  05/21/19 (!) 136/46  02/26/19 118/66         [place] Olarte K. Glenora Morocho M.D.

## 2019-08-31 ENCOUNTER — Other Ambulatory Visit: Payer: Self-pay

## 2019-08-31 ENCOUNTER — Encounter: Payer: Self-pay | Admitting: Internal Medicine

## 2019-08-31 ENCOUNTER — Ambulatory Visit (INDEPENDENT_AMBULATORY_CARE_PROVIDER_SITE_OTHER): Payer: Medicare Other | Admitting: Internal Medicine

## 2019-08-31 VITALS — BP 118/66 | HR 96 | Temp 97.6°F | Wt 161.2 lb

## 2019-08-31 DIAGNOSIS — M25552 Pain in left hip: Secondary | ICD-10-CM | POA: Diagnosis not present

## 2019-08-31 DIAGNOSIS — Z960 Presence of urogenital implants: Secondary | ICD-10-CM | POA: Diagnosis not present

## 2019-08-31 DIAGNOSIS — I10 Essential (primary) hypertension: Secondary | ICD-10-CM | POA: Diagnosis not present

## 2019-08-31 DIAGNOSIS — N289 Disorder of kidney and ureter, unspecified: Secondary | ICD-10-CM | POA: Diagnosis not present

## 2019-08-31 DIAGNOSIS — Z79899 Other long term (current) drug therapy: Secondary | ICD-10-CM | POA: Diagnosis not present

## 2019-08-31 DIAGNOSIS — E119 Type 2 diabetes mellitus without complications: Secondary | ICD-10-CM

## 2019-08-31 DIAGNOSIS — R1032 Left lower quadrant pain: Secondary | ICD-10-CM | POA: Diagnosis not present

## 2019-08-31 DIAGNOSIS — Z974 Presence of external hearing-aid: Secondary | ICD-10-CM | POA: Diagnosis not present

## 2019-08-31 DIAGNOSIS — R21 Rash and other nonspecific skin eruption: Secondary | ICD-10-CM

## 2019-08-31 DIAGNOSIS — R609 Edema, unspecified: Secondary | ICD-10-CM

## 2019-08-31 LAB — MICROALBUMIN / CREATININE URINE RATIO
Creatinine,U: 94.7 mg/dL
Microalb Creat Ratio: 10.9 mg/g (ref 0.0–30.0)
Microalb, Ur: 10.3 mg/dL — ABNORMAL HIGH (ref 0.0–1.9)

## 2019-08-31 LAB — CBC WITH DIFFERENTIAL/PLATELET
Basophils Absolute: 0 10*3/uL (ref 0.0–0.1)
Basophils Relative: 1 % (ref 0.0–3.0)
Eosinophils Absolute: 0.2 10*3/uL (ref 0.0–0.7)
Eosinophils Relative: 4.4 % (ref 0.0–5.0)
HCT: 35.1 % — ABNORMAL LOW (ref 36.0–46.0)
Hemoglobin: 11.8 g/dL — ABNORMAL LOW (ref 12.0–15.0)
Lymphocytes Relative: 35.5 % (ref 12.0–46.0)
Lymphs Abs: 1.3 10*3/uL (ref 0.7–4.0)
MCHC: 33.5 g/dL (ref 30.0–36.0)
MCV: 90 fl (ref 78.0–100.0)
Monocytes Absolute: 0.4 10*3/uL (ref 0.1–1.0)
Monocytes Relative: 11.8 % (ref 3.0–12.0)
Neutro Abs: 1.7 10*3/uL (ref 1.4–7.7)
Neutrophils Relative %: 47.3 % (ref 43.0–77.0)
Platelets: 190 10*3/uL (ref 150.0–400.0)
RBC: 3.9 Mil/uL (ref 3.87–5.11)
RDW: 14.2 % (ref 11.5–15.5)
WBC: 3.6 10*3/uL — ABNORMAL LOW (ref 4.0–10.5)

## 2019-08-31 LAB — BASIC METABOLIC PANEL
BUN: 24 mg/dL — ABNORMAL HIGH (ref 6–23)
CO2: 33 mEq/L — ABNORMAL HIGH (ref 19–32)
Calcium: 9.8 mg/dL (ref 8.4–10.5)
Chloride: 101 mEq/L (ref 96–112)
Creatinine, Ser: 1.29 mg/dL — ABNORMAL HIGH (ref 0.40–1.20)
GFR: 39.05 mL/min — ABNORMAL LOW (ref >60.00)
Glucose, Bld: 160 mg/dL — ABNORMAL HIGH (ref 70–99)
Potassium: 4.3 mEq/L (ref 3.5–5.1)
Sodium: 138 mEq/L (ref 135–145)

## 2019-08-31 LAB — LIPID PANEL
Cholesterol: 180 mg/dL (ref 0–200)
HDL: 35.8 mg/dL — ABNORMAL LOW (ref >39.00)
NonHDL: 144.26
Total CHOL/HDL Ratio: 5
Triglycerides: 323 mg/dL — ABNORMAL HIGH (ref 0.0–149.0)
VLDL: 64.6 mg/dL — ABNORMAL HIGH (ref 0.0–40.0)

## 2019-08-31 LAB — HEPATIC FUNCTION PANEL
ALT: 11 U/L (ref 0–35)
AST: 13 U/L (ref 0–37)
Albumin: 4.1 g/dL (ref 3.5–5.2)
Alkaline Phosphatase: 88 U/L (ref 39–117)
Bilirubin, Direct: 0.1 mg/dL (ref 0.0–0.3)
Total Bilirubin: 0.5 mg/dL (ref 0.2–1.2)
Total Protein: 6.7 g/dL (ref 6.0–8.3)

## 2019-08-31 LAB — LDL CHOLESTEROL, DIRECT: Direct LDL: 81 mg/dL

## 2019-08-31 LAB — C-REACTIVE PROTEIN: CRP: <1 mg/dL (ref 0.5–20.0)

## 2019-08-31 MED ORDER — FUROSEMIDE 40 MG PO TABS: ORAL_TABLET | ORAL | 3 refills | Status: DC

## 2019-08-31 MED ORDER — ATORVASTATIN CALCIUM 10 MG PO TABS: 10.0000 mg | ORAL_TABLET | Freq: Every day | ORAL | 3 refills | Status: DC

## 2019-08-31 MED ORDER — GLIPIZIDE ER 5 MG PO TB24: 5.0000 mg | ORAL_TABLET | Freq: Every day | ORAL | 3 refills | Status: DC

## 2019-08-31 MED ORDER — GABAPENTIN 300 MG PO CAPS: ORAL_CAPSULE | ORAL | 3 refills | Status: DC

## 2019-08-31 MED ORDER — CLOBETASOL PROPIONATE 0.05 % EX CREA: 1.0000 "application " | TOPICAL_CREAM | Freq: Two times a day (BID) | CUTANEOUS | 0 refills | Status: DC

## 2019-08-31 NOTE — Patient Instructions (Signed)
Referring to orthopedics  About pain  Left groin think it could be a hip problem  You can also call for appt.  Can take tylenol 500 mg twice a day in interim .   Will notify you  of labs when available.   Heart sounds good   So far  Will refill the meds   And the clobetosol  For and  rash as needed  If swelling gets worse we  May take extra lasix  Dose if needed   Plan follo wup depending on  Labs and how doing   Wt Readings from Last 3 Encounters:  08/31/19 161 lb 3.2 oz (73.1 kg)  05/21/19 158 lb 1.6 oz (71.7 kg)  02/26/19 156 lb 9.6 oz (71 kg)    BP Readings from Last 3 Encounters:  08/31/19 118/66  05/21/19 (!) 136/46  02/26/19 118/66

## 2019-09-01 DIAGNOSIS — D3132 Benign neoplasm of left choroid: Secondary | ICD-10-CM | POA: Diagnosis not present

## 2019-09-01 DIAGNOSIS — H40013 Open angle with borderline findings, low risk, bilateral: Secondary | ICD-10-CM | POA: Diagnosis not present

## 2019-09-01 DIAGNOSIS — H35033 Hypertensive retinopathy, bilateral: Secondary | ICD-10-CM | POA: Diagnosis not present

## 2019-09-01 DIAGNOSIS — H353132 Nonexudative age-related macular degeneration, bilateral, intermediate dry stage: Secondary | ICD-10-CM | POA: Diagnosis not present

## 2019-09-01 LAB — HEMOGLOBIN A1C: Hgb A1c MFr Bld: 8 % — ABNORMAL HIGH (ref 4.6–6.5)

## 2019-09-02 ENCOUNTER — Encounter: Payer: Self-pay | Admitting: Internal Medicine

## 2019-09-03 DIAGNOSIS — M25552 Pain in left hip: Secondary | ICD-10-CM | POA: Diagnosis not present

## 2019-09-03 DIAGNOSIS — M545 Low back pain: Secondary | ICD-10-CM | POA: Diagnosis not present

## 2019-09-03 DIAGNOSIS — M4856XA Collapsed vertebra, not elsewhere classified, lumbar region, initial encounter for fracture: Secondary | ICD-10-CM | POA: Diagnosis not present

## 2019-09-06 DIAGNOSIS — M545 Low back pain: Secondary | ICD-10-CM | POA: Diagnosis not present

## 2019-09-11 NOTE — Progress Notes (Signed)
So sugars indeed are up for you  as you expected   hg a1c is now 8  Please check blood glucose twice a day for 2 -3 weeks  and then  daily and  plan rrevew  readings in a month   We can then decide on  increasing  adjusting your medications   Advise less sugars  sweets in diet that may help to get down without new medication   [place] can you get a follow up telephone  or virtual in person all ok in about a month  in person virtual or even telephone as long as we have the  glucose readings  to review.

## 2019-09-14 DIAGNOSIS — R922 Inconclusive mammogram: Secondary | ICD-10-CM | POA: Diagnosis not present

## 2019-09-14 LAB — HM MAMMOGRAPHY

## 2019-09-15 ENCOUNTER — Encounter: Payer: Self-pay | Admitting: Internal Medicine

## 2019-09-15 DIAGNOSIS — M25552 Pain in left hip: Secondary | ICD-10-CM | POA: Diagnosis not present

## 2019-09-20 DIAGNOSIS — N312 Flaccid neuropathic bladder, not elsewhere classified: Secondary | ICD-10-CM | POA: Diagnosis not present

## 2019-10-07 ENCOUNTER — Encounter: Payer: Self-pay | Admitting: Internal Medicine

## 2019-10-07 ENCOUNTER — Telehealth (INDEPENDENT_AMBULATORY_CARE_PROVIDER_SITE_OTHER): Payer: Medicare Other | Admitting: Internal Medicine

## 2019-10-07 VITALS — Ht 60.5 in | Wt 161.0 lb

## 2019-10-07 DIAGNOSIS — N289 Disorder of kidney and ureter, unspecified: Secondary | ICD-10-CM | POA: Diagnosis not present

## 2019-10-07 DIAGNOSIS — Z79899 Other long term (current) drug therapy: Secondary | ICD-10-CM

## 2019-10-07 DIAGNOSIS — E119 Type 2 diabetes mellitus without complications: Secondary | ICD-10-CM

## 2019-10-07 MED ORDER — GLIPIZIDE ER 5 MG PO TB24: 5.0000 mg | ORAL_TABLET | Freq: Two times a day (BID) | ORAL | 3 refills | Status: DC

## 2019-10-07 NOTE — Progress Notes (Signed)
   Virtual Visit via Telephone Note  I connected with@ on 10/07/19 at  9:30 AM EDT by telephone and verified that I am speaking with the correct person using two identifiers.   I discussed the limitations, risks, security and privacy concerns of performing an evaluation and management service by telephone and the limited availability of in person appointments. tThere may be a patient responsible charge related to this service. The patient expressed understanding and agreed to proceed.  Location patient: home Location provider:  home office Participants present for the call: patient, provider Patient did not have a visit in the prior 7 days to address this/these issue(s).   History of Present Illness: [name]   bgs are still up 150 ocass 223  Today was  130 range   Working on eating better no lows Would not take metformin if offered .  No new sx.  Observations/Objective: Patient sounds personable and well on the phone. I do not appreciate any SOB. Speech and thought processing are grossly intact. Patient reported vitals: Lab Results  Component Value Date   WBC 3.6 (L) 08/31/2019   HGB 11.8 (L) 08/31/2019   HCT 35.1 (L) 08/31/2019   PLT 190.0 08/31/2019   GLUCOSE 160 (H) 08/31/2019   CHOL 180 08/31/2019   TRIG 323.0 (H) 08/31/2019   HDL 35.80 (L) 08/31/2019   LDLDIRECT 81.0 08/31/2019   LDLCALC 83 12/16/2013   ALT 11 08/31/2019   AST 13 08/31/2019   NA 138 08/31/2019   K 4.3 08/31/2019   CL 101 08/31/2019   CREATININE 1.29 (H) 08/31/2019   BUN 24 (H) 08/31/2019   CO2 33 (H) 08/31/2019   TSH 3.25 08/28/2018   INR 1.01 02/27/2016   HGBA1C 8.0 (H) 08/31/2019   MICROALBUR 10.3 (H) 08/31/2019    Assessment and Plan:  Plan increase to glucotro; bid with meals caution for low bg and check bid  For now   If ok we may change to 10    optinos to do 7.5 per day  She will continue working oin diet changes   Follow Up Instructions: Plan rov telephone ok in a month  with readings  And plan  Caution to check for hypoglycemia  Plan   I did not refer this patient for an OV in the next 24 hours for this/these issue(s).  I discussed the assessment and treatment plan with the patient. The patient was provided an opportunity to ask questions and answered. The patient agreed with the plan and demonstrated an understanding of the instructions.   The patient was advised to call back or seek an in-person evaluation if the symptoms worsen or if the condition fails to improve as anticipated. In interim  I provided 21 minutes of non-face-to-face time during this encounter. No follow-ups on file.  Shanon Ace, MD

## 2019-11-01 ENCOUNTER — Encounter: Payer: Self-pay | Admitting: Internal Medicine

## 2019-11-03 DIAGNOSIS — N312 Flaccid neuropathic bladder, not elsewhere classified: Secondary | ICD-10-CM | POA: Diagnosis not present

## 2019-11-08 ENCOUNTER — Telehealth (INDEPENDENT_AMBULATORY_CARE_PROVIDER_SITE_OTHER): Payer: BC Managed Care – PPO | Admitting: Internal Medicine

## 2019-11-08 ENCOUNTER — Encounter: Payer: Self-pay | Admitting: Internal Medicine

## 2019-11-08 ENCOUNTER — Other Ambulatory Visit: Payer: Self-pay

## 2019-11-08 VITALS — Temp 98.1°F | Ht 60.5 in | Wt 160.0 lb

## 2019-11-08 DIAGNOSIS — R609 Edema, unspecified: Secondary | ICD-10-CM | POA: Diagnosis not present

## 2019-11-08 DIAGNOSIS — E1169 Type 2 diabetes mellitus with other specified complication: Secondary | ICD-10-CM

## 2019-11-08 DIAGNOSIS — M1990 Unspecified osteoarthritis, unspecified site: Secondary | ICD-10-CM

## 2019-11-08 DIAGNOSIS — Z79899 Other long term (current) drug therapy: Secondary | ICD-10-CM

## 2019-11-08 DIAGNOSIS — N289 Disorder of kidney and ureter, unspecified: Secondary | ICD-10-CM

## 2019-11-08 MED ORDER — GLIPIZIDE ER 5 MG PO TB24: ORAL_TABLET | ORAL | 3 refills | Status: DC

## 2019-11-08 MED ORDER — FUROSEMIDE 40 MG PO TABS: 40.0000 mg | ORAL_TABLET | Freq: Every day | ORAL | 3 refills | Status: DC

## 2019-11-08 NOTE — Progress Notes (Signed)
Virtual Visit via Telephone Note  I connected with@ on 11/08/19 at  9:30 AM EDT by telephone and verified that I am speaking with the correct person using two identifiers.   I discussed the limitations, risks, security and privacy concerns of performing an evaluation and management service by telephone and the limited availability of in person appointments. tThere may be a patient responsible charge related to this service. The patient expressed understanding and agreed to proceed.  Location patient: home Location provider: work office Participants present for the call: patient, provider Patient did not have a visit in the prior 7 days to address this/these issue(s).   History of Present Illness: [name]   Fu monitoring  BG  Dm  And swelling in feet ankles  Had inc su to 5 bid  fo gli[iizide  See last notes  bg not that much better  No lows  Am rare 129  Today 169  Most range  Evening pp rare 129  Up to 283 about 50 % over 200   Feels could eat better  But having hip[p ana back pain every day  Had injrction helps very short term  Dr Maretta Los office . No falling Swelling in feet about the same  trie 20 mg per day of lasix with potassium and not a lot of difference  Did cause urination     Observations/Objective: Patient sounds personable and well on the phone. I do not appreciate any SOB. Speech and thought processing are grossly intact. Patient reported vitals: Lab Results  Component Value Date   WBC 3.6 (L) 08/31/2019   HGB 11.8 (L) 08/31/2019   HCT 35.1 (L) 08/31/2019   PLT 190.0 08/31/2019   GLUCOSE 160 (H) 08/31/2019   CHOL 180 08/31/2019   TRIG 323.0 (H) 08/31/2019   HDL 35.80 (L) 08/31/2019   LDLDIRECT 81.0 08/31/2019   LDLCALC 83 12/16/2013   ALT 11 08/31/2019   AST 13 08/31/2019   NA 138 08/31/2019   K 4.3 08/31/2019   CL 101 08/31/2019   CREATININE 1.29 (H) 08/31/2019   BUN 24 (H) 08/31/2019   CO2 33 (H) 08/31/2019   TSH 3.25 08/28/2018   INR 1.01  02/27/2016   HGBA1C 8.0 (H) 08/31/2019   MICROALBUR 10.3 (H) 08/31/2019    Assessment and Plan: Type 2 diabetes mellitus with other specified complication, without long-term current use of insulin ([place]) - Plan: Basic metabolic panel, Hemoglobin A1c, CBC with Differential/Platelet  Medication management - Plan: Basic metabolic panel, Hemoglobin A1c, CBC with Differential/Platelet  Edema, unspecified type - Plan: Basic metabolic panel, Hemoglobin A1c, CBC with Differential/Platelet  Renal insufficiency - Plan: Basic metabolic panel, Hemoglobin A1c, CBC with Differential/Platelet  Osteoarthritis, unspecified osteoarthritis type, unspecified site - Plan: Basic metabolic panel, Hemoglobin A1c, CBC with Differential/Platelet    Follow Up Instructions: Declines  Insulin use  Reasonable  Joint pain arthritic more problematic   May need to fu with ortho again Increase to 15 mg per day 10 and 5  Take lasix 40 mg per day  And f plan fu  In June  In person preferred but could do tele visit  Plan fu a1vc and chem cbc  in June ro thereabouts      Then in person of telephone  I did not refer this patient for an OV in the next 24 hours for this/these issue(s).  I discussed the assessment and treatment plan with the patient. The patient was provided an opportunity to ask questions and  answered. The patient agreed with the plan and demonstrated an understanding of the instructions.   The patient was advised to call back or seek an in-person evaluation if the symptoms worsen or if the condition fails to improve as anticipated.  I provided *18 minutes of non-face-to-face time during this encounter. Return for lab at [facility] early  June and then in person visit fo FU  ( can be switched to telephone if needed) .  Shanon Ace, MD

## 2019-12-13 DIAGNOSIS — R338 Other retention of urine: Secondary | ICD-10-CM | POA: Diagnosis not present

## 2019-12-16 DIAGNOSIS — M7062 Trochanteric bursitis, left hip: Secondary | ICD-10-CM | POA: Diagnosis not present

## 2019-12-16 DIAGNOSIS — M48061 Spinal stenosis, lumbar region without neurogenic claudication: Secondary | ICD-10-CM | POA: Diagnosis not present

## 2019-12-16 DIAGNOSIS — M25552 Pain in left hip: Secondary | ICD-10-CM | POA: Diagnosis not present

## 2019-12-25 ENCOUNTER — Encounter (HOSPITAL_COMMUNITY): Payer: Self-pay

## 2019-12-25 ENCOUNTER — Inpatient Hospital Stay (HOSPITAL_COMMUNITY)
Admission: EM | Admit: 2019-12-25 | Discharge: 2019-12-31 | DRG: 553 | Disposition: A | Discharge status: Skilled Nursing Facility | Payer: Medicare Other | Attending: Internal Medicine | Admitting: Internal Medicine

## 2019-12-25 ENCOUNTER — Observation Stay (HOSPITAL_COMMUNITY): Payer: Medicare Other

## 2019-12-25 ENCOUNTER — Other Ambulatory Visit: Payer: Self-pay

## 2019-12-25 DIAGNOSIS — I452 Bifascicular block: Secondary | ICD-10-CM | POA: Diagnosis present

## 2019-12-25 DIAGNOSIS — I129 Hypertensive chronic kidney disease with stage 1 through stage 4 chronic kidney disease, or unspecified chronic kidney disease: Secondary | ICD-10-CM | POA: Diagnosis present

## 2019-12-25 DIAGNOSIS — Z803 Family history of malignant neoplasm of breast: Secondary | ICD-10-CM

## 2019-12-25 DIAGNOSIS — Z8042 Family history of malignant neoplasm of prostate: Secondary | ICD-10-CM

## 2019-12-25 DIAGNOSIS — M25552 Pain in left hip: Secondary | ICD-10-CM | POA: Diagnosis not present

## 2019-12-25 DIAGNOSIS — F418 Other specified anxiety disorders: Secondary | ICD-10-CM | POA: Diagnosis present

## 2019-12-25 DIAGNOSIS — N179 Acute kidney failure, unspecified: Secondary | ICD-10-CM | POA: Diagnosis not present

## 2019-12-25 DIAGNOSIS — Z800 Family history of malignant neoplasm of digestive organs: Secondary | ICD-10-CM

## 2019-12-25 DIAGNOSIS — E1122 Type 2 diabetes mellitus with diabetic chronic kidney disease: Secondary | ICD-10-CM | POA: Diagnosis present

## 2019-12-25 DIAGNOSIS — E785 Hyperlipidemia, unspecified: Secondary | ICD-10-CM | POA: Diagnosis present

## 2019-12-25 DIAGNOSIS — M1612 Unilateral primary osteoarthritis, left hip: Secondary | ICD-10-CM | POA: Diagnosis not present

## 2019-12-25 DIAGNOSIS — Z853 Personal history of malignant neoplasm of breast: Secondary | ICD-10-CM

## 2019-12-25 DIAGNOSIS — Z79899 Other long term (current) drug therapy: Secondary | ICD-10-CM

## 2019-12-25 DIAGNOSIS — Z20822 Contact with and (suspected) exposure to covid-19: Secondary | ICD-10-CM | POA: Diagnosis present

## 2019-12-25 DIAGNOSIS — K219 Gastro-esophageal reflux disease without esophagitis: Secondary | ICD-10-CM | POA: Diagnosis present

## 2019-12-25 DIAGNOSIS — R52 Pain, unspecified: Secondary | ICD-10-CM | POA: Diagnosis present

## 2019-12-25 DIAGNOSIS — Z923 Personal history of irradiation: Secondary | ICD-10-CM

## 2019-12-25 DIAGNOSIS — Z8249 Family history of ischemic heart disease and other diseases of the circulatory system: Secondary | ICD-10-CM

## 2019-12-25 DIAGNOSIS — K449 Diaphragmatic hernia without obstruction or gangrene: Secondary | ICD-10-CM | POA: Diagnosis present

## 2019-12-25 DIAGNOSIS — M5136 Other intervertebral disc degeneration, lumbar region: Secondary | ICD-10-CM | POA: Diagnosis present

## 2019-12-25 DIAGNOSIS — N1832 Chronic kidney disease, stage 3b: Secondary | ICD-10-CM | POA: Diagnosis present

## 2019-12-25 DIAGNOSIS — Z96653 Presence of artificial knee joint, bilateral: Secondary | ICD-10-CM | POA: Diagnosis present

## 2019-12-25 DIAGNOSIS — Z7982 Long term (current) use of aspirin: Secondary | ICD-10-CM

## 2019-12-25 DIAGNOSIS — M25559 Pain in unspecified hip: Secondary | ICD-10-CM

## 2019-12-25 DIAGNOSIS — F341 Dysthymic disorder: Secondary | ICD-10-CM | POA: Diagnosis present

## 2019-12-25 DIAGNOSIS — M47816 Spondylosis without myelopathy or radiculopathy, lumbar region: Secondary | ICD-10-CM | POA: Diagnosis present

## 2019-12-25 DIAGNOSIS — M4316 Spondylolisthesis, lumbar region: Secondary | ICD-10-CM | POA: Diagnosis present

## 2019-12-25 DIAGNOSIS — G92 Toxic encephalopathy: Secondary | ICD-10-CM | POA: Diagnosis present

## 2019-12-25 DIAGNOSIS — D631 Anemia in chronic kidney disease: Secondary | ICD-10-CM | POA: Diagnosis present

## 2019-12-25 DIAGNOSIS — Z833 Family history of diabetes mellitus: Secondary | ICD-10-CM

## 2019-12-25 DIAGNOSIS — M5410 Radiculopathy, site unspecified: Secondary | ICD-10-CM

## 2019-12-25 DIAGNOSIS — M1990 Unspecified osteoarthritis, unspecified site: Secondary | ICD-10-CM | POA: Diagnosis present

## 2019-12-25 DIAGNOSIS — N319 Neuromuscular dysfunction of bladder, unspecified: Secondary | ICD-10-CM | POA: Diagnosis present

## 2019-12-25 DIAGNOSIS — M549 Dorsalgia, unspecified: Secondary | ICD-10-CM

## 2019-12-25 DIAGNOSIS — Z7984 Long term (current) use of oral hypoglycemic drugs: Secondary | ICD-10-CM

## 2019-12-25 DIAGNOSIS — E039 Hypothyroidism, unspecified: Secondary | ICD-10-CM | POA: Diagnosis present

## 2019-12-25 LAB — BASIC METABOLIC PANEL
Anion gap: 10 (ref 5–15)
BUN: 39 mg/dL — ABNORMAL HIGH (ref 8–23)
CO2: 30 mmol/L (ref 22–32)
Calcium: 9.5 mg/dL (ref 8.9–10.3)
Chloride: 97 mmol/L — ABNORMAL LOW (ref 98–111)
Creatinine, Ser: 1.26 mg/dL — ABNORMAL HIGH (ref 0.44–1.00)
GFR calc Af Amer: 44 mL/min — ABNORMAL LOW (ref >60)
GFR calc non Af Amer: 38 mL/min — ABNORMAL LOW (ref >60)
Glucose, Bld: 186 mg/dL — ABNORMAL HIGH (ref 70–99)
Potassium: 4.7 mmol/L (ref 3.5–5.1)
Sodium: 137 mmol/L (ref 135–145)

## 2019-12-25 LAB — CBC WITH DIFFERENTIAL/PLATELET
Abs Immature Granulocytes: 0.02 10*3/uL (ref 0.00–0.07)
Basophils Absolute: 0 10*3/uL (ref 0.0–0.1)
Basophils Relative: 0 %
Eosinophils Absolute: 0 10*3/uL (ref 0.0–0.5)
Eosinophils Relative: 0 %
HCT: 39 % (ref 36.0–46.0)
Hemoglobin: 12.9 g/dL (ref 12.0–15.0)
Immature Granulocytes: 0 %
Lymphocytes Relative: 16 %
Lymphs Abs: 1.2 10*3/uL (ref 0.7–4.0)
MCH: 31.2 pg (ref 26.0–34.0)
MCHC: 33.1 g/dL (ref 30.0–36.0)
MCV: 94.2 fL (ref 80.0–100.0)
Monocytes Absolute: 0.6 10*3/uL (ref 0.1–1.0)
Monocytes Relative: 9 %
Neutro Abs: 5.5 10*3/uL (ref 1.7–7.7)
Neutrophils Relative %: 75 %
Platelets: 211 10*3/uL (ref 150–400)
RBC: 4.14 MIL/uL (ref 3.87–5.11)
RDW: 13.2 % (ref 11.5–15.5)
WBC: 7.4 10*3/uL (ref 4.0–10.5)
nRBC: 0 % (ref 0.0–0.2)

## 2019-12-25 LAB — PROTIME-INR
INR: 1 (ref 0.8–1.2)
Prothrombin Time: 12.7 seconds (ref 11.4–15.2)

## 2019-12-25 LAB — GLUCOSE, CAPILLARY: Glucose-Capillary: 247 mg/dL — ABNORMAL HIGH (ref 70–99)

## 2019-12-25 LAB — SARS CORONAVIRUS 2 BY RT PCR (HOSPITAL ORDER, PERFORMED IN ~~LOC~~ HOSPITAL LAB): SARS Coronavirus 2: NEGATIVE

## 2019-12-25 MED ORDER — MORPHINE SULFATE (PF) 4 MG/ML IV SOLN
4.0000 mg | Freq: Once | INTRAVENOUS | Status: AC
  Administered 2019-12-25: 4 mg via INTRAVENOUS
  Filled 2019-12-25: qty 1

## 2019-12-25 MED ORDER — VITAMIN D 25 MCG (1000 UNIT) PO TABS
2000.0000 [IU] | ORAL_TABLET | Freq: Every morning | ORAL | Status: DC
  Administered 2019-12-26 – 2019-12-31 (×6): 2000 [IU] via ORAL
  Filled 2019-12-25 (×7): qty 2

## 2019-12-25 MED ORDER — BISACODYL 5 MG PO TBEC
5.0000 mg | DELAYED_RELEASE_TABLET | Freq: Every day | ORAL | Status: DC
  Administered 2019-12-26 – 2019-12-31 (×6): 5 mg via ORAL
  Filled 2019-12-25 (×6): qty 1

## 2019-12-25 MED ORDER — PREDNISONE 20 MG PO TABS
30.0000 mg | ORAL_TABLET | Freq: Every day | ORAL | Status: DC
  Filled 2019-12-25: qty 2

## 2019-12-25 MED ORDER — BISACODYL 10 MG RE SUPP: 10.0000 mg | Freq: Every day | RECTAL | Status: DC | PRN

## 2019-12-25 MED ORDER — VITAMIN B-12 1000 MCG PO TABS
2000.0000 ug | ORAL_TABLET | Freq: Every day | ORAL | Status: DC
  Administered 2019-12-26 – 2019-12-31 (×6): 2000 ug via ORAL
  Filled 2019-12-25 (×6): qty 2

## 2019-12-25 MED ORDER — PANTOPRAZOLE SODIUM 40 MG PO TBEC
40.0000 mg | DELAYED_RELEASE_TABLET | Freq: Every day | ORAL | Status: DC
  Administered 2019-12-26 – 2019-12-31 (×6): 40 mg via ORAL
  Filled 2019-12-25 (×6): qty 1

## 2019-12-25 MED ORDER — CALCIUM CARBONATE-VITAMIN D 500-200 MG-UNIT PO TABS
1.0000 | ORAL_TABLET | Freq: Two times a day (BID) | ORAL | Status: DC
  Administered 2019-12-25 – 2019-12-31 (×12): 1 via ORAL
  Filled 2019-12-25 (×12): qty 1

## 2019-12-25 MED ORDER — OXYCODONE HCL 5 MG PO TABS
2.5000 mg | ORAL_TABLET | ORAL | Status: DC | PRN
  Administered 2019-12-26 (×2): 5 mg via ORAL
  Filled 2019-12-25 (×3): qty 1

## 2019-12-25 MED ORDER — ACETAMINOPHEN 500 MG PO TABS
1000.0000 mg | ORAL_TABLET | Freq: Three times a day (TID) | ORAL | Status: DC
  Administered 2019-12-25 – 2019-12-26 (×2): 1000 mg via ORAL
  Filled 2019-12-25 (×2): qty 2

## 2019-12-25 MED ORDER — ONDANSETRON HCL 4 MG/2ML IJ SOLN
4.0000 mg | Freq: Four times a day (QID) | INTRAMUSCULAR | Status: DC | PRN
  Administered 2019-12-25 – 2019-12-31 (×2): 4 mg via INTRAVENOUS
  Filled 2019-12-25 (×2): qty 2

## 2019-12-25 MED ORDER — ASPIRIN EC 81 MG PO TBEC
81.0000 mg | DELAYED_RELEASE_TABLET | Freq: Every day | ORAL | Status: DC
  Administered 2019-12-26 – 2019-12-31 (×6): 81 mg via ORAL
  Filled 2019-12-25 (×6): qty 1

## 2019-12-25 MED ORDER — ATORVASTATIN CALCIUM 10 MG PO TABS
10.0000 mg | ORAL_TABLET | Freq: Every day | ORAL | Status: DC
  Administered 2019-12-25 – 2019-12-31 (×7): 10 mg via ORAL
  Filled 2019-12-25 (×7): qty 1

## 2019-12-25 MED ORDER — ENOXAPARIN SODIUM 30 MG/0.3ML ~~LOC~~ SOLN
30.0000 mg | SUBCUTANEOUS | Status: DC
  Administered 2019-12-25 – 2019-12-30 (×6): 30 mg via SUBCUTANEOUS
  Filled 2019-12-25 (×6): qty 0.3

## 2019-12-25 MED ORDER — PREDNISONE 10 MG PO TABS: 10.0000 mg | ORAL_TABLET | Freq: Every day | ORAL | Status: DC

## 2019-12-25 MED ORDER — PREDNISONE 50 MG PO TABS
50.0000 mg | ORAL_TABLET | Freq: Every day | ORAL | Status: AC
  Administered 2019-12-25 – 2019-12-26 (×2): 50 mg via ORAL
  Filled 2019-12-25 (×2): qty 1

## 2019-12-25 MED ORDER — INSULIN ASPART 100 UNIT/ML ~~LOC~~ SOLN
0.0000 [IU] | Freq: Three times a day (TID) | SUBCUTANEOUS | Status: DC
  Administered 2019-12-26 (×2): 3 [IU] via SUBCUTANEOUS
  Administered 2019-12-27 (×2): 1 [IU] via SUBCUTANEOUS
  Administered 2019-12-27 – 2019-12-28 (×2): 2 [IU] via SUBCUTANEOUS
  Administered 2019-12-28: 1 [IU] via SUBCUTANEOUS
  Administered 2019-12-28: 3 [IU] via SUBCUTANEOUS
  Administered 2019-12-29: 1 [IU] via SUBCUTANEOUS
  Administered 2019-12-29 – 2019-12-30 (×3): 3 [IU] via SUBCUTANEOUS
  Administered 2019-12-30 – 2019-12-31 (×4): 2 [IU] via SUBCUTANEOUS
  Filled 2019-12-25: qty 0.09

## 2019-12-25 MED ORDER — POLYETHYLENE GLYCOL 3350 17 G PO PACK
17.0000 g | PACK | Freq: Every day | ORAL | Status: DC | PRN
  Administered 2019-12-29 – 2019-12-30 (×2): 17 g via ORAL
  Filled 2019-12-25 (×2): qty 1

## 2019-12-25 MED ORDER — MORPHINE SULFATE (PF) 2 MG/ML IV SOLN: 2.0000 mg | INTRAVENOUS | Status: DC | PRN

## 2019-12-25 MED ORDER — OXYCODONE HCL 5 MG PO TABS: 5.0000 mg | ORAL_TABLET | ORAL | Status: DC | PRN

## 2019-12-25 MED ORDER — HYDROMORPHONE HCL 1 MG/ML IJ SOLN
0.5000 mg | Freq: Once | INTRAMUSCULAR | Status: AC
  Administered 2019-12-25: 0.5 mg via INTRAVENOUS
  Filled 2019-12-25: qty 1

## 2019-12-25 MED ORDER — PROMETHAZINE HCL 25 MG/ML IJ SOLN
12.5000 mg | Freq: Four times a day (QID) | INTRAMUSCULAR | Status: DC | PRN
  Administered 2019-12-25 – 2019-12-26 (×2): 12.5 mg via INTRAVENOUS
  Filled 2019-12-25 (×2): qty 1

## 2019-12-25 MED ORDER — LIDOCAINE 5 % EX PTCH
1.0000 | MEDICATED_PATCH | CUTANEOUS | Status: DC
  Administered 2019-12-25: 1 via TRANSDERMAL
  Filled 2019-12-25: qty 1

## 2019-12-25 NOTE — ED Provider Notes (Signed)
[facility] DEPT Provider Note   CSN: [id number] Arrival date & time: 12/25/19  4481     History Chief Complaint  Patient presents with  . Hip Pain    [name] is a 86 y.o. female.  Patient is a 86 year old female who presents with left hip pain.  She has had some chronic issues with pain in her left hip that starts in the posterior hip and radiates around to her groin.  Also goes down her leg.  She was diagnosed with spinal stenosis and had surgery in 2018 for this per her report.  She also recently had an injection of her hip 2 weeks ago by Dr. Maureen Ralphs per her report.  She says it helped a little bit but over the last 3 to 4 days it is gotten worse.  She attributes the worsening to sitting on a chair for a few hours cleaning some furniture on her deck.  She denies any falls or other injuries to her hip.  No fevers.  She has a chronic indwelling urinary catheter.  She denies any loss of bowel control.  She said that her orthopedist started her on tramadol yesterday for the worsening pain but it has not been helping.        Past Medical History:  Diagnosis Date  . Anemia   . Arthritis   . Bilateral edema of lower extremity   . 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 Chronic lower back pain   . CKD (chronic kidney disease) stage 3, GFR 30-59 ml/min   . DDD (degenerative disc disease)    lumbar  . Diverticulosis   . Family history of breast cancer   . Family history of pancreatic cancer   . Family history of prostate cancer   . Flaccid neuropathic bladder, not elsewhere classified   . Foley catheter in place   . GERD (gastroesophageal reflux disease)   . Hemorrhoids   . Hiatal hernia   . History of colon polyps   . History of  radiation therapy 09-12-2015 to 10-12-2015   42.72 gray in 16 fractions directed right breast w/ boost of 12 gray in 6 fractions directed at the lumpectomy cavity- Total dose: 54.72y  . History of recurrent UTIs   . Hyperlipidemia   . PONV (postoperative nausea and vomiting)   . RBBB (right bundle branch block with left anterior fascicular block)   . Type 2 diabetes mellitus ([place])   . Urinary retention with incomplete bladder emptying   . Wears dentures    full upper and lower partial  . Wears glasses   . Wears hearing aid    bilateral but does not wear    Patient Active Problem List   Diagnosis Date Noted  . Spinal stenosis, lumbar region, with neurogenic claudication 03/06/2016  . Malignant neoplasm of right female breast ([place]) 01/11/2016  . Controlled type 2 diabetes mellitus without complication, without long-term current use of insulin ([place]) 01/11/2016  . Genetic testing 08/18/2015  . Family history of breast cancer   . Family history of prostate cancer   . Family history of pancreatic cancer   . Breast cancer of upper-outer quadrant of right female breast ([place]) 07/21/2015  . Insomnia 05/06/2015  . Medication management 12/16/2013  . Hand  arthritis 12/16/2013  . Rectal bleeding 12/16/2013  . Cold intolerance 06/17/2013  . Renal insufficiency 12/23/2012  . Post-menopause 12/23/2012  . Hyperlipidemia 12/23/2012  . Postmenopausal HRT (hormone replacement therapy) 12/23/2012  . History of shingles 12/23/2012  . Wears hearing aid 12/23/2012  . Nonspecific abnormal unspecified cardiovascular function study 02/28/2012  . Chronic sore throat 01/13/2012  . Hoarse 01/13/2012  . Chest pressure 12/03/2011  . Itching  mid back 12/03/2011  . Abnormal EKG 12/03/2011  . Medicare annual wellness visit, subsequent 12/03/2011  . DJD (degenerative joint disease) 12/03/2011  . DIABETES MELLITUS, TYPE II, CONTROLLED 11/15/2009  . VITAMIN D DEFICIENCY 11/15/2009  . EXOGENOUS OBESITY  11/15/2009  . ANXIETY DEPRESSION 12/22/2008  . HYPERTHYROIDISM 08/23/2008  . TOTAL KNEE REPLACEMENT, LEFT, HX OF 08/23/2008  . HYPOTHYROIDISM 10/08/2007  . ANEMIA 10/08/2007  . ARTHRITIS 10/08/2007  . OSTEOPENIA 10/08/2007  . HYPERLIPIDEMIA 11/07/2006  . GERD 11/07/2006    Past Surgical History:  Procedure Laterality Date  . BREAST LUMPECTOMY WITH RADIOACTIVE SEED LOCALIZATION Right 08/03/2015   Procedure: BREAST LUMPECTOMY WITH RADIOACTIVE SEED LOCALIZATION;  Surgeon: [name], MD;  Location: [facility];  Service: General;  Laterality: Right;  . CARDIOVASCULAR STRESS TEST  01/14/2012   Low risk nuclear study w/ small mild apical and apical lateral reversible perfusion defect represents a small area of ischemia versus shifting breast artifact/  normal LV funciton and wall motion, ef 86%  . CARPAL TUNNEL RELEASE Right 1977  . CATARACT EXTRACTION W/ INTRAOCULAR LENS  IMPLANT, BILATERAL Bilateral left 1996/  right 1997  . CLOSED RIGHT KNEE MANIPULATION  08/11/2002   post TKA  . CYSTOSTOMY N/A 05/17/2016   Procedure: CYSTOSCOPY WITH  SUPRAPUBIC PLACMENT;  Surgeon: [name], MD;  Location[facility];  Service: Urology;  Laterality: N/A;  . GANGLION CYST EXCISION Right 12/09/2001   right palm  . KNEE ARTHROSCOPY Right 12/14/2002   w/ Lysis Adhesions  . LUMBAR LAMINECTOMY/DECOMPRESSION MICRODISCECTOMY N/A 03/06/2016   Procedure: CENTRAL DECOMPRESSION L3 - L4 ,L4 - L5;  Surgeon: [name], MD;  Location: [facility];  Service: Orthopedics;  Laterality: N/A;  . SHOULDER HEMI-ARTHROPLASTY Right 02/20/2009   avascular necrosis   . TOTAL KNEE ARTHROPLASTY Bilateral right 06-23-2002/  left 07-11-2008  . [place]  . VAGINAL HYSTERECTOMY  1975     OB History   No obstetric history on file.     Family History  Problem Relation Age of Onset  . Pancreatic cancer Mother 42  . Diabetes Mother   . Prostate cancer Brother   .  Diabetes Father   . Heart disease Father   . Diabetes Brother   . Heart attack Brother   . Diabetes Sister   . Diabetes Daughter   . Diabetes Son   . Coronary artery disease Brother        MI in his 26s  . Colon cancer Neg Hx   . Stomach cancer Neg Hx     Social History   Tobacco Use  . Smoking status: Never Smoker  . Smokeless tobacco: Never Used  Vaping Use  . Vaping Use: Never used  Substance Use Topics  . Alcohol use: No  . Drug use: No    Home Medications Prior to Admission medications   Medication Sig Start Date End Date Taking? Authorizing Provider  acetaminophen (TYLENOL) 500 MG tablet Take 500 mg by mouth every 6 (six) hours as needed for mild pain.   Yes [provider]  aspirin EC 81 MG tablet Take 81 mg by mouth daily.   Yes [provider]  atorvastatin (LIPITOR) 10 MG tablet Take 1 tablet (10 mg total) by mouth daily. 08/31/19  Yes Panosh, Standley Brooking, MD  Blood Glucose Monitoring Suppl (ACCU-CHEK AVIVA PLUS) w/Device KIT Check blood sugar TID-QID Patient taking differently: 1 each by Other route See admin instructions. Check blood sugar TID-QID 08/26/17  Yes Panosh, Standley Brooking, MD  Calcium Citrate-Vitamin D (CALCIUM CITRATE +D PO) Take 630 mg by mouth 2 (two) times daily. Vit d3 is 500iu   Yes [provider]  Cholecalciferol (VITAMIN D3) 2000 units TABS Take 1 tablet by mouth every morning.    Yes [provider]  cyanocobalamin 2000 MCG tablet Take 2,000 mcg by mouth daily.   Yes [provider]  esomeprazole (NEXIUM) 40 MG capsule TAKE 1 CAPSULE(40 MG) BY MOUTH DAILY Patient taking differently: Take 40 mg by mouth every other day.  08/13/19  Yes Panosh, Standley Brooking, MD  furosemide (LASIX) 40 MG tablet Take 1 tablet (40 mg total) by mouth daily. Or as directed 11/08/19  Yes Panosh, Standley Brooking, MD  gabapentin (NEURONTIN) 300 MG capsule TAKE 1 CAPSULE(300 MG) BY MOUTH AT BEDTIME Patient taking differently: Take 300 mg by mouth at  bedtime.  08/31/19  Yes Panosh, Standley Brooking, MD  glipiZIDE (GLUCOTROL XL) 5 MG 24 hr tablet Take 10 mg ( 2- 5 mg) in morning  and 5 mg evening meal . Patient taking differently: Take 5 mg by mouth daily with breakfast.  11/08/19  Yes Panosh, Standley Brooking, MD  glucose blood (ACCU-CHEK AVIVA PLUS) test strip Use TID-QID to check blood sugars. Patient taking differently: 1 each by Other route See admin instructions. Use TID-QID to check blood sugars. 08/26/17  Yes Panosh, Standley Brooking, MD  [facility] VERIO test strip 1 each by Other route as directed.  10/08/16  Yes [provider]  Potassium 99 MG TABS Take 1 tablet by mouth every morning.   Yes [provider]  traMADol (ULTRAM) 50 MG tablet Take 50 mg by mouth every 8 (eight) hours as needed for moderate pain.  12/24/19  Yes [provider]  vitamin C (ASCORBIC ACID) 500 MG tablet Take 500 mg by mouth daily.   Yes [provider]  clobetasol cream (TEMOVATE) 3.35 % Apply 1 application topically 2 (two) times daily. As dircted  Limit to 2 weeks at a time Patient not taking: Reported on 10/07/2019 08/31/19   Panosh, Standley Brooking, MD    Allergies    Patient has no known allergies.  Review of Systems   Review of Systems  Constitutional: Negative for fever.  Gastrointestinal: Negative for nausea and vomiting.  Musculoskeletal: Positive for arthralgias and back pain. Negative for joint swelling and neck pain.  Skin: Negative for wound.  Neurological: Negative for weakness, numbness and headaches.    Physical Exam Updated Vital Signs BP (!) 143/70   Pulse 87   Temp 97.9 F (36.6 C) (Oral)   Resp 16   Ht 5' 1" (1.549 m)   Wt 72.6 kg   SpO2 97%   BMI 30.23 kg/m   Physical Exam Constitutional:      Appearance: She is well-developed.  HENT:     Head: Normocephalic and atraumatic.  Cardiovascular:     Rate and Rhythm: Normal rate.  Pulmonary:     Effort: Pulmonary effort is normal.  Musculoskeletal:  General: Tenderness  present.     Cervical back: Normal range of motion and neck supple.     Comments: Patient has some tenderness in her lower lumbar area, sacral area on the left and along the posterior left hip.  She also has some tenderness to the lateral left hip and around to the left groin.  There is no significant pain on range of motion of the hip joint.  No warmth or erythema over the joint.  Pedal pulses are intact.  She has normal sensation to light touch and motor strength in the lower extremities bilaterally.  She does have some limited ability to do straight leg raise due to pain in her hip.  Skin:    General: Skin is warm and dry.  Neurological:     Mental Status: She is alert and oriented to person, place, and time.     ED Results / Procedures / Treatments   Labs (all labs ordered are listed, but only abnormal results are displayed) Labs Reviewed  SARS CORONAVIRUS 2 BY RT PCR (HOSPITAL ORDER, [place] [facility])    EKG None  Radiology No results found.  Procedures Procedures (including critical care time)  Medications Ordered in ED Medications  HYDROmorphone (DILAUDID) injection 0.5 mg (has no administration in time range)  morphine 4 MG/ML injection 4 mg (4 mg Intravenous Given 12/25/19 1050)  morphine 4 MG/ML injection 4 mg (4 mg Intravenous Given 12/25/19 1316)    ED Course  I have reviewed the triage vital signs and the nursing notes.  Pertinent labs & imaging results that were available during my care of the patient were reviewed by me and considered in my medical decision making (see chart for details).    MDM Rules/Calculators/A&P                          Patient is a 86 year old female who presents with back and hip pain on the left side.  She has pain similarly to what she has had in the past although it is much worse today.  It is the same character of pain and location that she has had in the past.  It does appear that she has had a recent MRI of her  lumbar spine as well as her left hip but I do not have access to these records.  She does not have any suggestions of cauda equina.  No neurologic deficits.  No suggestions of infection.  She has not gotten any pain relief with treatment in the ED.  She has an expectation that she wants something fixed today.  I try to tailor her expectations to more of a pain control issue.  I advised her that we would attempt to get her pain control in the ED and if appropriate we could discharge with outpatient pain medicines.  She has not got any relief and does not want to leave without improvement.  I spoke with the orthopedist on-call, Dr. Doran Durand who feels that she potentially could be admitted to the hospitalist service for pain control and if she still in the hospital on Monday, Dr. Maureen Ralphs can see the patient.  I spoke with Dr.  Francesco Sor who will admit the patient.  I did realize that I had not ordered any baseline labs.  These were ordered as well as a Covid test.  These are pending. Final Clinical Impression(s) / ED Diagnoses Final diagnoses:  Radicular low back pain  Rx / DC Orders ED Discharge Orders    None       Malvin Johns, MD 12/25/19 1540

## 2019-12-25 NOTE — H&P (Signed)
History and Physical    [name] TJQ:[id number] DOB: 07/23/1929 DOA: 12/25/2019  PCP: Burnis Medin, MD   Patient coming from: home  I have personally briefly reviewed patient's old medical records in [name]  Chief Complaint: intractable pain  HPI: [name] is a 86 y.o. female with a pertinent history of spinal stenosis of the lumbar region followed by [facility], undergoing a 3 month work up for a similar process that has now flared up, also has CKD 3B, DM, OA, hyperlipidemia, breast cancer diagnosed in 2017 who presented to [name] with intractable hip pain.  Over the past 1 month she has been having chronic Left "hip pains" pointing to the piriformis area, lumbosacral.  It has been slowly worsening especially the past 2-3 weeks, until last Thursday when she was sitting and leaning over washing her patio furniture and went to bed okay.  She awoke in quite some pain.  She denies any urinary or bowel incontinence.  Has had chronic Left hip flexor weakness, needin gto lift it with her hands.  She states that she had an MRI of the a couple months ago.  Orthopedics prescribed her tramadol which is helped minimally.  Dr. Maureen Ralphs gave an injection which helped some.  She states she has tried NSAID and tylenol without much improvement.   In the emergency department nine 7.9 temperature, HR 78, RR 16, 127/70, 98% on room air.  WBC 3.6, Hgb 11.8, MCV 90-ish, PLT 190, NA 138, K4.3, CO2 33, SCR 1.29, chronically her GFR is have been upper 30s to 40s.  Her LFTs are okay.  Left hip  "IMPRESSION: Severe degenerative changes in the left hip with complete loss of joint space, subchondral cysts, and resulting protrusio acetabuli."  After a conversation with orthopedics, it was decided to bring the patient in for observation for intractable pain control with consideration of hip replacement in the future. Morphine 75m x2 and dilaudid 0.515mIV with minimal improvement.     Review of  Systems: As per HPI otherwise 10 point review of systems negative.  Other pertinents as below:  General - denies any new HA's or visual changes.   HEENT - denies any visual changes Cardio - denies any chest pain or palpitations Resp - denies any cough or sob GI - has nausea and vomiting from the dilaudid she thinks GU - denies any urinary changes, denies any urinary or bowel incontinence MSK - denies any joint infections or other infections recently Skin - denies any new skin changes Neuro - denies any new numbness or weakness Psych -   Past Medical History:  Diagnosis Date  . Anemia   . Arthritis   . Bilateral edema of lower extremity   . 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 Chronic lower back pain   . CKD (chronic kidney disease) stage 3, GFR 30-59 ml/min   . DDD (degenerative disc disease)    lumbar  . Diverticulosis   . Family history of breast cancer   . Family history of pancreatic cancer   . Family history of prostate cancer   . Flaccid neuropathic bladder, not elsewhere classified   . Foley catheter in place   . GERD (gastroesophageal reflux disease)   . Hemorrhoids   .  Hiatal hernia   . History of colon polyps   . History of radiation therapy 09-12-2015 to 10-12-2015   42.72 gray in 16 fractions directed right breast w/ boost of 12 gray in 6 fractions directed at the lumpectomy cavity- Total dose: 54.72y  . History of recurrent UTIs   . Hyperlipidemia   . PONV (postoperative nausea and vomiting)   . RBBB (right bundle branch block with left anterior fascicular block)   . Type 2 diabetes mellitus ([place])   . Urinary retention with incomplete bladder emptying   . Wears dentures    full upper and lower partial  . Wears glasses   . Wears hearing aid     bilateral but does not wear    Past Surgical History:  Procedure Laterality Date  . BREAST LUMPECTOMY WITH RADIOACTIVE SEED LOCALIZATION Right 08/03/2015   Procedure: BREAST LUMPECTOMY WITH RADIOACTIVE SEED LOCALIZATION;  Surgeon: [name], MD;  Location: [place];  Service: General;  Laterality: Right;  . CARDIOVASCULAR STRESS TEST  01/14/2012   Low risk nuclear study w/ small mild apical and apical lateral reversible perfusion defect represents a small area of ischemia versus shifting breast artifact/  normal LV funciton and wall motion, ef 86%  . CARPAL TUNNEL RELEASE Right 1977  . CATARACT EXTRACTION W/ INTRAOCULAR LENS  IMPLANT, BILATERAL Bilateral left 1996/  right 1997  . CLOSED RIGHT KNEE MANIPULATION  08/11/2002   post TKA  . CYSTOSTOMY N/A 05/17/2016   Procedure: CYSTOSCOPY WITH  SUPRAPUBIC PLACMENT;  Surgeon: [name], MD;  Location: [facility];  Service: Urology;  Laterality: N/A;  . GANGLION CYST EXCISION Right 12/09/2001   right palm  . KNEE ARTHROSCOPY Right 12/14/2002   w/ Lysis Adhesions  . LUMBAR LAMINECTOMY/DECOMPRESSION MICRODISCECTOMY N/A 03/06/2016   Procedure: CENTRAL DECOMPRESSION L3 - L4 ,L4 - L5;  Surgeon: [name], MD;  Location: [facility];  Service: Orthopedics;  Laterality: N/A;  . SHOULDER HEMI-ARTHROPLASTY Right 02/20/2009   avascular necrosis   . TOTAL KNEE ARTHROPLASTY Bilateral right 06-23-2002/  left 07-11-2008  . [place]  . VAGINAL HYSTERECTOMY  1975     reports that she has never smoked. She has never used smokeless tobacco. She reports that she does not drink alcohol and does not use drugs.  No Known Allergies  Family History  Problem Relation Age of Onset  . Pancreatic cancer Mother 76  . Diabetes Mother   . Prostate cancer Brother   . Diabetes Father   . Heart disease Father   . Diabetes Brother   . Heart attack Brother   . Diabetes Sister   . Diabetes Daughter   .  Diabetes Son   . Coronary artery disease Brother        MI in his 103s  . Colon cancer Neg Hx   . Stomach cancer Neg Hx     Prior to Admission medications   Medication Sig Start Date End Date Taking? Authorizing Provider  acetaminophen (TYLENOL) 500 MG tablet Take 500 mg by mouth every 6 (six) hours as needed for mild pain.   Yes [provider]  aspirin EC 81 MG tablet Take 81 mg by mouth daily.   Yes [provider]  atorvastatin (LIPITOR) 10 MG tablet Take 1 tablet (10 mg total) by mouth daily. 08/31/19  Yes Panosh, Standley Brooking, MD  Blood Glucose Monitoring Suppl (ACCU-CHEK AVIVA PLUS) w/Device KIT Check blood sugar TID-QID Patient taking differently: 1 each by  Other route See admin instructions. Check blood sugar TID-QID 08/26/17  Yes Panosh, Standley Brooking, MD  Calcium Citrate-Vitamin D (CALCIUM CITRATE +D PO) Take 630 mg by mouth 2 (two) times daily. Vit d3 is 500iu   Yes [provider]  Cholecalciferol (VITAMIN D3) 2000 units TABS Take 1 tablet by mouth every morning.    Yes [provider]  cyanocobalamin 2000 MCG tablet Take 2,000 mcg by mouth daily.   Yes [provider]  esomeprazole (NEXIUM) 40 MG capsule TAKE 1 CAPSULE(40 MG) BY MOUTH DAILY Patient taking differently: Take 40 mg by mouth every other day.  08/13/19  Yes Panosh, Standley Brooking, MD  furosemide (LASIX) 40 MG tablet Take 1 tablet (40 mg total) by mouth daily. Or as directed 11/08/19  Yes Panosh, Standley Brooking, MD  gabapentin (NEURONTIN) 300 MG capsule TAKE 1 CAPSULE(300 MG) BY MOUTH AT BEDTIME Patient taking differently: Take 300 mg by mouth at bedtime.  08/31/19  Yes Panosh, Standley Brooking, MD  glipiZIDE (GLUCOTROL XL) 5 MG 24 hr tablet Take 10 mg ( 2- 5 mg) in morning  and 5 mg evening meal . Patient taking differently: Take 5 mg by mouth daily with breakfast.  11/08/19  Yes Panosh, Standley Brooking, MD  glucose blood (ACCU-CHEK AVIVA PLUS) test strip Use TID-QID to check blood sugars. Patient taking differently: 1  each by Other route See admin instructions. Use TID-QID to check blood sugars. 08/26/17  Yes Panosh, Standley Brooking, MD  [facility] VERIO test strip 1 each by Other route as directed.  10/08/16  Yes [provider]  Potassium 99 MG TABS Take 1 tablet by mouth every morning.   Yes [provider]  traMADol (ULTRAM) 50 MG tablet Take 50 mg by mouth every 8 (eight) hours as needed for moderate pain.  12/24/19  Yes [provider]  vitamin C (ASCORBIC ACID) 500 MG tablet Take 500 mg by mouth daily.   Yes [provider]  clobetasol cream (TEMOVATE) 4.40 % Apply 1 application topically 2 (two) times daily. As dircted  Limit to 2 weeks at a time Patient not taking: Reported on 10/07/2019 08/31/19   Burnis Medin, MD    Physical Exam: Vitals:   12/25/19 1400 12/25/19 1537 12/25/19 1709 12/25/19 1857  BP: (!) 143/70 127/70 (!) 154/74 (!) 158/72  Pulse: 87 78 80 98  Resp: 16 16 16 15   Temp:    (!) 97.5 F (36.4 C)  TempSrc:    Oral  SpO2: 97% 98% 97% (!) 82%  Weight:      Height:        Constitutional: NAD, uncomfortable, fidgety in her bed. Eyes: pupils equal and reactive to light, anicteric, without injection ENMT: MMM, throat without exudates or erythema Neck: normal, supple, no masses, no thyromegaly noted Respiratory: CTAB, nwob, no cough Cardiovascular: rrr w/o mrg, warm extremities Abdomen: NBS, NT,   Musculoskeletal: exam limited by pain.  Seems she can move all 4 extremities, dorsalflexion and plantarflexion is 4+ on the left.  Point tenderness to lumbosacral on left side Skin: no rashes, lesions, ulcers. No induration Neurologic: CN 2-12 grossly intact. Sensation intact Psychiatric: AO appearing, mentation appropriate    Labs on Admission: I have personally reviewed following labs and imaging studies  CBC: Recent Labs  Lab 12/25/19 1539  WBC 7.4  NEUTROABS 5.5  HGB 12.9  HCT 39.0  MCV 94.2  PLT 347   Basic Metabolic Panel: Recent Labs  Lab  12/25/19 1539  NA  137  K 4.7  CL 97*  CO2 30  GLUCOSE 186*  BUN 39*  CREATININE 1.26*  CALCIUM 9.5   GFR: Estimated Creatinine Clearance: 28.7 mL/min (A) (by C-G formula based on SCr of 1.26 mg/dL (H)). Liver Function Tests: No results for input(s): AST, ALT, ALKPHOS, BILITOT, PROT, ALBUMIN in the last 168 hours. No results for input(s): LIPASE, AMYLASE in the last 168 hours. No results for input(s): AMMONIA in the last 168 hours. Coagulation Profile: No results for input(s): INR, PROTIME in the last 168 hours. Cardiac Enzymes: No results for input(s): CKTOTAL, CKMB, CKMBINDEX, TROPONINI in the last 168 hours. BNP (last 3 results) No results for input(s): PROBNP in the last 8760 hours. HbA1C: No results for input(s): HGBA1C in the last 72 hours. CBG: No results for input(s): GLUCAP in the last 168 hours. Lipid Profile: No results for input(s): CHOL, HDL, LDLCALC, TRIG, CHOLHDL, LDLDIRECT in the last 72 hours. Thyroid Function Tests: No results for input(s): TSH, T4TOTAL, FREET4, T3FREE, THYROIDAB in the last 72 hours. Anemia Panel: No results for input(s): VITAMINB12, FOLATE, FERRITIN, TIBC, IRON, RETICCTPCT in the last 72 hours. Urine analysis:    Component Value Date/Time   COLORURINE YELLOW 03/24/2016 0906   APPEARANCEUR TURBID (A) 03/24/2016 0906   LABSPEC 1.008 03/24/2016 0906   PHURINE 7.0 03/24/2016 0906   GLUCOSEU NEGATIVE 03/24/2016 0906   HGBUR MODERATE (A) 03/24/2016 0906   HGBUR negative 11/15/2009 0000   BILIRUBINUR negative 02/26/2019 1040   KETONESUR NEGATIVE 03/24/2016 0906   PROTEINUR Positive (A) 02/26/2019 1040   PROTEINUR NEGATIVE 03/24/2016 0906   UROBILINOGEN 0.2 02/26/2019 1040   UROBILINOGEN 0.2 11/15/2009 0000   NITRITE positive 02/26/2019 1040   NITRITE POSITIVE (A) 03/24/2016 0906   LEUKOCYTESUR Large (3+) (A) 02/26/2019 1040    Radiological Exams on Admission: DG Lumbar Spine 2-3 Views  Result Date: 12/25/2019 CLINICAL DATA:  Pain  EXAM: LUMBAR SPINE - 2-3 VIEW COMPARISON:  March 06, 2016 FINDINGS: 10 mm of anterolisthesis of L4 versus L5. 6 mm of anterolisthesis of L3 versus L4. These findings are similar in the interval. No acute fractures. Calcified atherosclerosis in the abdominal aorta. Lower lumbar facet degenerative changes. IMPRESSION: Malalignment, not significantly changed since February 27, 2016 as described above. No acute interval changes. Electronically Signed   By: Dorise Bullion III M.D   On: 12/25/2019 18:33   DG Si Joints  Result Date: 12/25/2019 CLINICAL DATA:  Pain EXAM: BILATERAL SACROILIAC JOINTS - 3+ VIEW COMPARISON:  None. FINDINGS: Mild degenerative changes in the SI joints. No bony erosions. No fractures. Severe degenerative changes and protrusio acetabuli seen on the left, further described on the x-ray of the left hip from today. IMPRESSION: Mild degenerative changes in the inferior SI joints. Severe degenerative changes in the left hip. Electronically Signed   By: Dorise Bullion III M.D   On: 12/25/2019 18:34   DG HIP UNILAT WITH PELVIS 2-3 VIEWS LEFT  Result Date: 12/25/2019 CLINICAL DATA:  Left hip pain. EXAM: DG HIP (WITH OR WITHOUT PELVIS) 2-3V LEFT COMPARISON:  None. FINDINGS: Severe degenerative changes in the left hip with complete loss of joint space and subchondral cysts in the femoral head and adjacent acetabulum. 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 No fracture. IMPRESSION: Severe degenerative changes in the left hip with complete loss of joint space, subchondral cysts, and resulting protrusio acetabuli. Electronically Signed   By: Dorise Bullion III M.D   On: 12/25/2019 18:30    EKG: Added an EKG to review  a QTc with QTc prolongers  Assessment/Plan Active Problems:   Intractable pain  Severe  Osteoarthritis Intractable pain, likely from radicular symptoms and will try for gentle opioids and prednisone to see if we can get it under control.  I consulted orthopedics for there help given  the subjective severity of the pain, especially after receiving morphine 53m x2 and dilaudid x1.  Scheduled tylenol --severe L hip osteoarthritis on x-ray with protrusio acetabuli --Dr. AMaureen Ralphson Monday if she is still here. --Orthopedic consult, appreciate their help. --oxycodone 2.5-513mfor mod and severe respectively. --prednisone taper of 6 days. --I tried to highlight expectations that we were just triyng to control the pain and wouldn't get rid of it. --consider muscle relaxer next? --please follow up on x-rays, consider talking with Ortho about them.  Inquire as to what MRI was showing a few months ago at [facility] --I suspect some exageration but think that she is being honest with the pain, would this cause a sooner hip placement or procedure?   CTM the patients neuro exam.    I think dilaudid caused Nausea and emesis and will add zofran and phenergan for refractory Please look at QTc in the morning.  Type 2 diabetes mellitus-hold oral medications, sliding scale insulin, 160 in the hospital now HLD Leukopenia-continue to monitor Normocytic anemia-continue to monitor Patient and/or Family completely agreed with the plan, expressed understanding and I answered all questions.  DVT prophylaxis: Heparin SQ and Lovenox SQ Code Status: Full code Family Communication: Daughter AnGlenard Haringho was in the room Disposition Plan: possibly home  Consults called: orthopedics Admission status: observation for intratcable pain    A total of 70 minutes utilized during this admission.  [facility]   If 7PM-7AM, please contact night-coverage [email or web address] Password [email or web address]6/19/2021, 8:54 PM

## 2019-12-25 NOTE — ED Notes (Signed)
Floor states Pt of this age will require low bed prior to transport. Will call when rdy.

## 2019-12-25 NOTE — Plan of Care (Signed)
Patient is a difficult historian but wonders if she has not urinated.  Rectal exam, I felt a squeeze and she denied saddle anesthesia.  She was sleeping when I entered the room and more comfortable.  Think less cauda equina. --wonder if pain was from surprisingly bladder retention.  She actually has a suprapubic catheter, states it was placed in 2013.  And we placed bag and I suspect a large amount came out.  Possibly matching the patient's, difficult to watch for urinary retention.  Empty the bag every 8 hours

## 2019-12-25 NOTE — ED Triage Notes (Signed)
EMS reports from home, Pt c/o increasing left hip pain radiating down leg since yesterday and difficulty ambulating. Denies trauma, falls, or injury.   BP 176/96 HR 92 RR 18 Sp02 94 RA CBG 178

## 2019-12-26 DIAGNOSIS — N312 Flaccid neuropathic bladder, not elsewhere classified: Secondary | ICD-10-CM | POA: Diagnosis not present

## 2019-12-26 DIAGNOSIS — N1831 Chronic kidney disease, stage 3a: Secondary | ICD-10-CM | POA: Diagnosis not present

## 2019-12-26 DIAGNOSIS — R0902 Hypoxemia: Secondary | ICD-10-CM | POA: Diagnosis not present

## 2019-12-26 DIAGNOSIS — Z800 Family history of malignant neoplasm of digestive organs: Secondary | ICD-10-CM | POA: Diagnosis not present

## 2019-12-26 DIAGNOSIS — G92 Toxic encephalopathy: Secondary | ICD-10-CM | POA: Diagnosis present

## 2019-12-26 DIAGNOSIS — M2550 Pain in unspecified joint: Secondary | ICD-10-CM | POA: Diagnosis not present

## 2019-12-26 DIAGNOSIS — Z96653 Presence of artificial knee joint, bilateral: Secondary | ICD-10-CM | POA: Diagnosis present

## 2019-12-26 DIAGNOSIS — D649 Anemia, unspecified: Secondary | ICD-10-CM | POA: Diagnosis not present

## 2019-12-26 DIAGNOSIS — Z923 Personal history of irradiation: Secondary | ICD-10-CM | POA: Diagnosis not present

## 2019-12-26 DIAGNOSIS — R2689 Other abnormalities of gait and mobility: Secondary | ICD-10-CM | POA: Diagnosis not present

## 2019-12-26 DIAGNOSIS — E785 Hyperlipidemia, unspecified: Secondary | ICD-10-CM | POA: Diagnosis not present

## 2019-12-26 DIAGNOSIS — M1612 Unilateral primary osteoarthritis, left hip: Secondary | ICD-10-CM | POA: Diagnosis not present

## 2019-12-26 DIAGNOSIS — F341 Dysthymic disorder: Secondary | ICD-10-CM | POA: Diagnosis not present

## 2019-12-26 DIAGNOSIS — M47816 Spondylosis without myelopathy or radiculopathy, lumbar region: Secondary | ICD-10-CM | POA: Diagnosis present

## 2019-12-26 DIAGNOSIS — M5136 Other intervertebral disc degeneration, lumbar region: Secondary | ICD-10-CM | POA: Diagnosis present

## 2019-12-26 DIAGNOSIS — F418 Other specified anxiety disorders: Secondary | ICD-10-CM | POA: Diagnosis present

## 2019-12-26 DIAGNOSIS — R52 Pain, unspecified: Secondary | ICD-10-CM | POA: Diagnosis not present

## 2019-12-26 DIAGNOSIS — E559 Vitamin D deficiency, unspecified: Secondary | ICD-10-CM | POA: Diagnosis not present

## 2019-12-26 DIAGNOSIS — N319 Neuromuscular dysfunction of bladder, unspecified: Secondary | ICD-10-CM | POA: Diagnosis present

## 2019-12-26 DIAGNOSIS — M6281 Muscle weakness (generalized): Secondary | ICD-10-CM | POA: Diagnosis not present

## 2019-12-26 DIAGNOSIS — C50411 Malignant neoplasm of upper-outer quadrant of right female breast: Secondary | ICD-10-CM | POA: Diagnosis not present

## 2019-12-26 DIAGNOSIS — M899 Disorder of bone, unspecified: Secondary | ICD-10-CM | POA: Diagnosis not present

## 2019-12-26 DIAGNOSIS — Z7401 Bed confinement status: Secondary | ICD-10-CM | POA: Diagnosis not present

## 2019-12-26 DIAGNOSIS — R41841 Cognitive communication deficit: Secondary | ICD-10-CM | POA: Diagnosis not present

## 2019-12-26 DIAGNOSIS — M549 Dorsalgia, unspecified: Secondary | ICD-10-CM | POA: Diagnosis not present

## 2019-12-26 DIAGNOSIS — D631 Anemia in chronic kidney disease: Secondary | ICD-10-CM | POA: Diagnosis present

## 2019-12-26 DIAGNOSIS — K219 Gastro-esophageal reflux disease without esophagitis: Secondary | ICD-10-CM

## 2019-12-26 DIAGNOSIS — M4316 Spondylolisthesis, lumbar region: Secondary | ICD-10-CM | POA: Diagnosis present

## 2019-12-26 DIAGNOSIS — K449 Diaphragmatic hernia without obstruction or gangrene: Secondary | ICD-10-CM | POA: Diagnosis present

## 2019-12-26 DIAGNOSIS — E119 Type 2 diabetes mellitus without complications: Secondary | ICD-10-CM | POA: Diagnosis not present

## 2019-12-26 DIAGNOSIS — E871 Hypo-osmolality and hyponatremia: Secondary | ICD-10-CM | POA: Diagnosis not present

## 2019-12-26 DIAGNOSIS — E1122 Type 2 diabetes mellitus with diabetic chronic kidney disease: Secondary | ICD-10-CM | POA: Diagnosis present

## 2019-12-26 DIAGNOSIS — M25552 Pain in left hip: Secondary | ICD-10-CM | POA: Diagnosis not present

## 2019-12-26 DIAGNOSIS — Z8042 Family history of malignant neoplasm of prostate: Secondary | ICD-10-CM | POA: Diagnosis not present

## 2019-12-26 DIAGNOSIS — N1832 Chronic kidney disease, stage 3b: Secondary | ICD-10-CM | POA: Diagnosis present

## 2019-12-26 DIAGNOSIS — Z7984 Long term (current) use of oral hypoglycemic drugs: Secondary | ICD-10-CM | POA: Diagnosis not present

## 2019-12-26 DIAGNOSIS — I129 Hypertensive chronic kidney disease with stage 1 through stage 4 chronic kidney disease, or unspecified chronic kidney disease: Secondary | ICD-10-CM | POA: Diagnosis not present

## 2019-12-26 DIAGNOSIS — E039 Hypothyroidism, unspecified: Secondary | ICD-10-CM | POA: Diagnosis not present

## 2019-12-26 DIAGNOSIS — N179 Acute kidney failure, unspecified: Secondary | ICD-10-CM | POA: Diagnosis not present

## 2019-12-26 DIAGNOSIS — Z803 Family history of malignant neoplasm of breast: Secondary | ICD-10-CM | POA: Diagnosis not present

## 2019-12-26 DIAGNOSIS — M25559 Pain in unspecified hip: Secondary | ICD-10-CM | POA: Diagnosis not present

## 2019-12-26 DIAGNOSIS — I452 Bifascicular block: Secondary | ICD-10-CM | POA: Diagnosis present

## 2019-12-26 DIAGNOSIS — Z20822 Contact with and (suspected) exposure to covid-19: Secondary | ICD-10-CM | POA: Diagnosis present

## 2019-12-26 LAB — GLUCOSE, CAPILLARY
Glucose-Capillary: 241 mg/dL — ABNORMAL HIGH (ref 70–99)
Glucose-Capillary: 161 mg/dL — ABNORMAL HIGH (ref 70–99)
Glucose-Capillary: 211 mg/dL — ABNORMAL HIGH (ref 70–99)
Glucose-Capillary: 246 mg/dL — ABNORMAL HIGH (ref 70–99)

## 2019-12-26 LAB — BASIC METABOLIC PANEL
Anion gap: 7 (ref 5–15)
BUN: 39 mg/dL — ABNORMAL HIGH (ref 8–23)
CO2: 28 mmol/L (ref 22–32)
Calcium: 9.5 mg/dL (ref 8.9–10.3)
Chloride: 98 mmol/L (ref 98–111)
Creatinine, Ser: 1.2 mg/dL — ABNORMAL HIGH (ref 0.44–1.00)
GFR calc Af Amer: 47 mL/min — ABNORMAL LOW (ref >60)
GFR calc non Af Amer: 41 mL/min — ABNORMAL LOW (ref >60)
Glucose, Bld: 216 mg/dL — ABNORMAL HIGH (ref 70–99)
Potassium: 5.2 mmol/L — ABNORMAL HIGH (ref 3.5–5.1)
Sodium: 133 mmol/L — ABNORMAL LOW (ref 135–145)

## 2019-12-26 LAB — CBC
HCT: 36.3 % (ref 36.0–46.0)
Hemoglobin: 11.9 g/dL — ABNORMAL LOW (ref 12.0–15.0)
MCH: 30.9 pg (ref 26.0–34.0)
MCHC: 32.8 g/dL (ref 30.0–36.0)
MCV: 94.3 fL (ref 80.0–100.0)
Platelets: 201 10*3/uL (ref 150–400)
RBC: 3.85 MIL/uL — ABNORMAL LOW (ref 3.87–5.11)
RDW: 13.3 % (ref 11.5–15.5)
WBC: 6 10*3/uL (ref 4.0–10.5)
nRBC: 0 % (ref 0.0–0.2)

## 2019-12-26 LAB — HEMOGLOBIN A1C
Hgb A1c MFr Bld: 7.3 % — ABNORMAL HIGH (ref 4.8–5.6)
Mean Plasma Glucose: 162.81 mg/dL

## 2019-12-26 MED ORDER — DICLOFENAC SODIUM 1 % EX GEL
2.0000 g | Freq: Four times a day (QID) | CUTANEOUS | Status: DC
  Administered 2019-12-26 – 2019-12-31 (×20): 2 g via TOPICAL
  Filled 2019-12-26 (×3): qty 100

## 2019-12-26 MED ORDER — PREDNISONE 20 MG PO TABS
20.0000 mg | ORAL_TABLET | Freq: Every day | ORAL | Status: DC
  Administered 2019-12-27 – 2019-12-31 (×5): 20 mg via ORAL
  Filled 2019-12-26 (×5): qty 1

## 2019-12-26 MED ORDER — CHLORHEXIDINE GLUCONATE CLOTH 2 % EX PADS
6.0000 | MEDICATED_PAD | Freq: Every day | CUTANEOUS | Status: DC
  Administered 2019-12-26 – 2019-12-31 (×6): 6 via TOPICAL

## 2019-12-26 MED ORDER — OXYCODONE HCL 5 MG PO TABS
5.0000 mg | ORAL_TABLET | ORAL | Status: DC | PRN
  Administered 2019-12-26 – 2019-12-31 (×21): 5 mg via ORAL
  Filled 2019-12-26 (×22): qty 1

## 2019-12-26 MED ORDER — ACETAMINOPHEN 325 MG PO TABS
650.0000 mg | ORAL_TABLET | Freq: Four times a day (QID) | ORAL | Status: DC
  Administered 2019-12-26 – 2019-12-31 (×20): 650 mg via ORAL
  Filled 2019-12-26 (×20): qty 2

## 2019-12-26 NOTE — Progress Notes (Signed)
PROGRESS NOTE    [name]  JJO:[id number] DOB: 1930-02-24 DOA: 12/25/2019 PCP: Burnis Medin, MD    Brief Narrative:  Patient admitted to the hospital with a working diagnosis of intractable pain due to severe osteoarthritis in the left hip.  86 year old female with significant past medical history for spinal stenosis, chronic kidney disease stage IIIb, type 2 diabetes mellitus, dyslipidemia, history of breast cancer and osteoarthritis. Patient reported worsening left hip pain for the last 2 to 3 weeks, more severe over the last 4 days.  As an outpatient her pain has been refractive to medical therapy with nonsteroidal anti-inflammatory agents and local injection by orthopedics. On her initial physical examination her heart rate was 78, respiratory rate 16, blood pressure 127/70, oxygen saturation 98%.  Her lungs are clear to auscultation bilaterally, heart S1-S2, present and rhythmic, soft abdomen, no lower extremity edema.  Positive tenderness at the lumbosacral region on the left side. Sodium 137, potassium 4.7, chloride 97, bicarb 30, glucose 186, BUN 39, creatinine 1.26, white cell count 7.4, hemoglobin 12.9, hematocrit 39.0, platelets 211.  SARS COVID-19 negative. Left hip films with severe degenerative changes in the left hip with complete loss of joint space, subchondral cyst and resultant protrusion acetabuli.  EKG 103 bpm, left axis deviation, left anterior fascicular block, right bundle branch block, sinus rhythm, poor R wave progression, no ST segment or T wave changes.  Assessment & Plan:   Principal Problem:   Intractable pain/ left hip osteoarthritis Active Problems:   Hypothyroidism   Dyslipidemia   ANXIETY DEPRESSION   GERD   Osteoarthritis   1. Intractable left hip pain due to osteoarthritis. Patient has failed outpatient therapy, she continue to have persistent pain. This am received phenergan for nausea and developed encephalopathy with worsening confusion.     Will increase acetaminophen to 650 mg q 6 H scheduled, add topical diclofenac, and continue low dose prednisone. Oxycodone as need for break through pain.   Once mentation improves will start patient on physical and occupational therapy.   2. Acute metabolic and toxic encephalopathy. Patient with confusion and disorientation. Non focal.   Will continue neuro checks q 4 H and aspiration precautions. Discontinue phenergan for now.   3. Uncontrolled T2DM with dyslipidemia. Continue glucose control and monitoring with insulin sliding scale. Poor oral intake due to encephalopathy. Her fasting glucose this am is 216.  Will decrease dose of steroids to 20 mg daily to prevent further hyperglycemia.   Continue with atorvastatin.   4, CKD stage 3b. Renal function with serum cr at 1,20 with K at 5,2 and serum bicarbonate at 28.  Will continue close follow up of renal function and electrolytes, avoid hypotension and nephrotoxic medications.   Patient continue to be at high risk for worsening encephalopathy   Status is: Observation  The patient will require care spanning > 2 midnights and should be moved to inpatient because: Intractable pain   Dispo: The patient is from: Home              Anticipated d/c is to: Home              Anticipated d/c date is: 2 days              Patient currently is not medically stable to d/c.   DVT prophylaxis: Enoxaparin   Code Status:   full  Family Communication:  I spoke with patient's daughter and husband at the bedside, we talked in detail about patient's  condition, plan of care and prognosis and all questions were addressed.    Consultants:   Orthopedics      Subjective: Patient continue to have significant pain at her left hip, worse with movement, 10/10 intensity, despite oral and IV analgesics. This am with acute confusional state after phenergan injection.   Objective: Vitals:   12/25/19 2248 12/26/19 0254 12/26/19 0314 12/26/19 0853   BP: 131/64 127/73  (!) 156/84  Pulse: 97 (!) 102  94  Resp: 16 16  13   Temp: 97.9 F (36.6 C) 98.3 F (36.8 C)  97.8 F (36.6 C)  TempSrc:    Oral  SpO2: 95% 90% 96% 100%  Weight:      Height:        Intake/Output Summary (Last 24 hours) at 12/26/2019 0908 Last data filed at 12/26/2019 0600 Gross per 24 hour  Intake --  Output 600 ml  Net -600 ml   Filed Weights   12/25/19 0922  Weight: 72.6 kg    Examination:   General: Not in pain or dyspnea, deconditioned and ill looking appearing  Neurology: somnolent but easy to arouse, able to respond to simple questions and follow commands, strength is preserved 4 extremities. Positive confusion and disorientation.  E ENT: mild pallor, no icterus, oral mucosa moist Cardiovascular: No JVD. S1-S2 present, rhythmic, no gallops, rubs, or murmurs. No lower extremity edema. Pulmonary: positive breath sounds bilaterally, adequate air movement, no wheezing, rhonchi or rales. Gastrointestinal. Abdomen with, no organomegaly, non tender, no rebound or guarding Skin. No rashes Musculoskeletal: tenderness at palpation left hip     Data Reviewed: I have personally reviewed following labs and imaging studies  CBC: Recent Labs  Lab 12/25/19 1539  WBC 7.4  NEUTROABS 5.5  HGB 12.9  HCT 39.0  MCV 94.2  PLT 573   Basic Metabolic Panel: Recent Labs  Lab 12/25/19 1539 12/26/19 0603  NA 137 133*  K 4.7 5.2*  CL 97* 98  CO2 30 28  GLUCOSE 186* 216*  BUN 39* 39*  CREATININE 1.26* 1.20*  CALCIUM 9.5 9.5   GFR: Estimated Creatinine Clearance: 30.1 mL/min (A) (by C-G formula based on SCr of 1.2 mg/dL (H)). Liver Function Tests: No results for input(s): AST, ALT, ALKPHOS, BILITOT, PROT, ALBUMIN in the last 168 hours. No results for input(s): LIPASE, AMYLASE in the last 168 hours. No results for input(s): AMMONIA in the last 168 hours. Coagulation Profile: Recent Labs  Lab 12/25/19 2114  INR 1.0   Cardiac Enzymes: No results  for input(s): CKTOTAL, CKMB, CKMBINDEX, TROPONINI in the last 168 hours. BNP (last 3 results) No results for input(s): PROBNP in the last 8760 hours. HbA1C: Recent Labs    12/25/19 1843  HGBA1C 7.3*   CBG: Recent Labs  Lab 12/25/19 2112  GLUCAP 247*   Lipid Profile: No results for input(s): CHOL, HDL, LDLCALC, TRIG, CHOLHDL, LDLDIRECT in the last 72 hours. Thyroid Function Tests: No results for input(s): TSH, T4TOTAL, FREET4, T3FREE, THYROIDAB in the last 72 hours. Anemia Panel: No results for input(s): VITAMINB12, FOLATE, FERRITIN, TIBC, IRON, RETICCTPCT in the last 72 hours.    Radiology Studies: I have reviewed all of the imaging during this hospital visit personally     Scheduled Meds: . acetaminophen  1,000 mg Oral Q8H  . aspirin EC  81 mg Oral Daily  . atorvastatin  10 mg Oral q1800  . bisacodyl  5 mg Oral Daily  . calcium-vitamin D  1 tablet Oral BID  .  Chlorhexidine Gluconate Cloth  6 each Topical Daily  . cholecalciferol  2,000 Units Oral q morning - 10a  . enoxaparin (LOVENOX) injection  30 mg Subcutaneous Q24H  . insulin aspart  0-9 Units Subcutaneous TID WC  . lidocaine  1 patch Transdermal Q24H  . pantoprazole  40 mg Oral Daily  . [START ON 12/27/2019] predniSONE  30 mg Oral Q breakfast   Followed by  . [START ON 12/29/2019] predniSONE  10 mg Oral Q breakfast  . cyanocobalamin  2,000 mcg Oral Daily   Continuous Infusions:   LOS: 0 days        Asharia Lotter Gerome Apley, MD

## 2019-12-26 NOTE — Consult Note (Signed)
Reason for Consult: Intractable left hip/low back pain Referring Physician: [facility] hospitalist  HPI: [name] is an 86 y.o. female, longtime patient of [facility], who has been followed most recently for chronic and progressive increasing left hip pain related to end stage osteoarthritis.  She received a left hip intra-articular injection back in March of this year and had some modest improvements for several months.  She was seen in the office last week with complaints of increased pain and at that time a left hip trochanteric injection had been performed.  She had also been referred to our partner for consideration of a lumbar ESI due to her known severe lumbar degenerative disc disease and spondylosis.  Her symptoms have now deteriorated to the point that she has become immobile and was admitted yesterday for intractable low back/left hip pain.  Since admission she has had altered mentation thought to be secondary to Phenergan which has subsequently been discontinued.  Patient describes her recent difficulties with pain is localized diffusely across the lower back.  She does have a history of previous lumbar decompressive surgery several years ago.  Past Medical History:  Diagnosis Date  . Anemia   . Arthritis   . Bilateral edema of lower extremity   . 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 Chronic lower back pain   . CKD (chronic kidney disease) stage 3, GFR 30-59 ml/min   . DDD (degenerative disc disease)    lumbar  . Diverticulosis   . Family history of breast cancer   . Family history of pancreatic cancer   . Family history of prostate cancer   . Flaccid neuropathic bladder, not elsewhere classified   . Foley catheter in place   . GERD (gastroesophageal reflux  disease)   . Hemorrhoids   . Hiatal hernia   . History of colon polyps   . History of radiation therapy 09-12-2015 to 10-12-2015   42.72 gray in 16 fractions directed right breast w/ boost of 12 gray in 6 fractions directed at the lumpectomy cavity- Total dose: 54.72y  . History of recurrent UTIs   . Hyperlipidemia   . PONV (postoperative nausea and vomiting)   . RBBB (right bundle branch block with left anterior fascicular block)   . Type 2 diabetes mellitus ([place])   . Urinary retention with incomplete bladder emptying   . Wears dentures    full upper and lower partial  . Wears glasses   . Wears hearing aid    bilateral but does not wear    Past Surgical History:  Procedure Laterality Date  . BREAST LUMPECTOMY WITH RADIOACTIVE SEED LOCALIZATION Right 08/03/2015   Procedure: BREAST LUMPECTOMY WITH RADIOACTIVE SEED LOCALIZATION;  Surgeon: [name], MD;  Location: [facility];  Service: General;  Laterality: Right;  . CARDIOVASCULAR STRESS TEST  01/14/2012   Low risk nuclear study w/ small mild apical and apical lateral reversible perfusion defect represents a small area of ischemia versus shifting breast artifact/  normal LV funciton and wall motion, ef 86%  . CARPAL TUNNEL RELEASE Right 1977  . CATARACT EXTRACTION W/ INTRAOCULAR LENS  IMPLANT, BILATERAL Bilateral left 1996/  right 1997  . CLOSED RIGHT KNEE MANIPULATION  08/11/2002   post TKA  . CYSTOSTOMY N/A 05/17/2016   Procedure: CYSTOSCOPY  WITH  SUPRAPUBIC PLACMENT;  Surgeon: [name], MD;  Location: [facility];  Service: Urology;  Laterality: N/A;  . GANGLION CYST EXCISION Right 12/09/2001   right palm  . KNEE ARTHROSCOPY Right 12/14/2002   w/ Lysis Adhesions  . LUMBAR LAMINECTOMY/DECOMPRESSION MICRODISCECTOMY N/A 03/06/2016   Procedure: CENTRAL DECOMPRESSION L3 - L4 ,L4 - L5;  Surgeon: [name], MD;  Location: [facility];  Service: Orthopedics;  Laterality: N/A;  . SHOULDER  HEMI-ARTHROPLASTY Right 02/20/2009   avascular necrosis   . TOTAL KNEE ARTHROPLASTY Bilateral right 06-23-2002/  left 07-11-2008  . [name]  . VAGINAL HYSTERECTOMY  1975    Family History  Problem Relation Age of Onset  . Pancreatic cancer Mother 33  . Diabetes Mother   . Prostate cancer Brother   . Diabetes Father   . Heart disease Father   . Diabetes Brother   . Heart attack Brother   . Diabetes Sister   . Diabetes Daughter   . Diabetes Son   . Coronary artery disease Brother        MI in his 48s  . Colon cancer Neg Hx   . Stomach cancer Neg Hx     Social History:  reports that she has never smoked. She has never used smokeless tobacco. She reports that she does not drink alcohol and does not use drugs.  Allergies: No Known Allergies  Medications: I have reviewed the patient's current medications.  Results for orders placed or performed during the hospital encounter of 12/25/19 (from the past 48 hour(s))  SARS Coronavirus 2 by RT PCR (hospital order, performed in [name] hospital lab) Nasopharyngeal Nasopharyngeal Swab     Status: None   Collection Time: 12/25/19  2:55 PM   Specimen: Nasopharyngeal Swab  Result Value Ref Range   SARS Coronavirus 2 NEGATIVE NEGATIVE    Comment: (NOTE) SARS-CoV-2 target nucleic acids are NOT DETECTED.  The SARS-CoV-2 RNA is generally detectable in upper and lower respiratory specimens during the acute phase of infection. The lowest concentration of SARS-CoV-2 viral copies this assay can detect is 250 copies / mL. A negative result does not preclude SARS-CoV-2 infection and should not be used as the sole basis for treatment or other patient management decisions.  A negative result may occur with improper specimen collection / handling, submission of specimen other than nasopharyngeal swab, presence of viral mutation(s) within the areas targeted by this assay, and inadequate number of viral copies (<250 copies  / mL). A negative result must be combined with clinical observations, patient history, and epidemiological information.  Fact Sheet for Patients:   StrictlyIdeas.no  Fact Sheet for Healthcare Providers: BankingDealers.co.za  This test is not yet approved or  cleared by the [place] FDA and has been authorized for detection and/or diagnosis of SARS-CoV-2 by FDA under an Emergency Use Authorization (EUA).  This EUA will remain in effect (meaning this test can be used) for the duration of the COVID-19 declaration under Section 564(b)(1) of the Act, 21 U.S.C. section 360bbb-3(b)(1), unless the authorization is terminated or revoked sooner.  Performed at [facility], [place] [address] [place], [place] [address]   Basic metabolic panel     Status: Abnormal   Collection Time: 12/25/19  3:39 PM  Result Value Ref Range   Sodium 137 135 - 145 mmol/L   Potassium 4.7 3.5 - 5.1 mmol/L   Chloride 97 (L) 98 - 111 mmol/L   CO2 30 22 -  32 mmol/L   Glucose, Bld 186 (H) 70 - 99 mg/dL    Comment: Glucose reference range applies only to samples taken after fasting for at least 8 hours.   BUN 39 (H) 8 - 23 mg/dL   Creatinine, Ser 1.26 (H) 0.44 - 1.00 mg/dL   Calcium 9.5 8.9 - 10.3 mg/dL   GFR calc non Af Amer 38 (L) >60 mL/min   GFR calc Af Amer 44 (L) >60 mL/min   Anion gap 10 5 - 15    Comment: Performed at [facility], [place] [address] [place], [place] [address]  CBC with Differential     Status: None   Collection Time: 12/25/19  3:39 PM  Result Value Ref Range   WBC 7.4 4.0 - 10.5 K/uL   RBC 4.14 3.87 - 5.11 MIL/uL   Hemoglobin 12.9 12.0 - 15.0 g/dL   HCT 39.0 36 - 46 %   MCV 94.2 80.0 - 100.0 fL   MCH 31.2 26.0 - 34.0 pg   MCHC 33.1 30.0 - 36.0 g/dL   RDW 13.2 11.5 - 15.5 %   Platelets 211 150 - 400 K/uL   nRBC 0.0 0.0 - 0.2 %   Neutrophils Relative % 75 %   Neutro Abs 5.5 1.7 - 7.7 K/uL    Lymphocytes Relative 16 %   Lymphs Abs 1.2 0.7 - 4.0 K/uL   Monocytes Relative 9 %   Monocytes Absolute 0.6 0 - 1 K/uL   Eosinophils Relative 0 %   Eosinophils Absolute 0.0 0 - 0 K/uL   Basophils Relative 0 %   Basophils Absolute 0.0 0 - 0 K/uL   Immature Granulocytes 0 %   Abs Immature Granulocytes 0.02 0.00 - 0.07 K/uL    Comment: Performed at [facility], [place] [address] [place], [place] [address]  Hemoglobin A1c     Status: Abnormal   Collection Time: 12/25/19  6:43 PM  Result Value Ref Range   Hgb A1c MFr Bld 7.3 (H) 4.8 - 5.6 %    Comment: (NOTE) Pre diabetes:          5.7%-6.4%  Diabetes:              >6.4%  Glycemic control for   <7.0% adults with diabetes    Mean Plasma Glucose 162.81 mg/dL    Comment: Performed at [facility] [address], [place], [place] [address]  Glucose, capillary     Status: Abnormal   Collection Time: 12/25/19  9:12 PM  Result Value Ref Range   Glucose-Capillary 247 (H) 70 - 99 mg/dL    Comment: Glucose reference range applies only to samples taken after fasting for at least 8 hours.  Protime-INR     Status: None   Collection Time: 12/25/19  9:14 PM  Result Value Ref Range   Prothrombin Time 12.7 11.4 - 15.2 seconds   INR 1.0 0.8 - 1.2    Comment: (NOTE) INR goal varies based on device and disease states. Performed at [facility], [place] [address], [place], [place] [address]   Basic metabolic panel     Status: Abnormal   Collection Time: 12/26/19  6:03 AM  Result Value Ref Range   Sodium 133 (L) 135 - 145 mmol/L   Potassium 5.2 (H) 3.5 - 5.1 mmol/L   Chloride 98 98 - 111 mmol/L   CO2 28 22 - 32 mmol/L   Glucose, Bld 216 (H) 70 - 99  mg/dL    Comment: Glucose reference range applies only to samples taken after fasting for at least 8 hours.   BUN 39 (H) 8 - 23 mg/dL   Creatinine, Ser 1.20 (H) 0.44 - 1.00 mg/dL   Calcium 9.5 8.9 - 10.3 mg/dL   GFR calc non Af Amer 41 (L) >60 mL/min   GFR  calc Af Amer 47 (L) >60 mL/min   Anion gap 7 5 - 15    Comment: Performed at [facility], [place] [address], [place], [place] [address]    DG Lumbar Spine 2-3 Views  Result Date: 12/25/2019 CLINICAL DATA:  Pain EXAM: LUMBAR SPINE - 2-3 VIEW COMPARISON:  March 06, 2016 FINDINGS: 10 mm of anterolisthesis of L4 versus L5. 6 mm of anterolisthesis of L3 versus L4. These findings are similar in the interval. No acute fractures. Calcified atherosclerosis in the abdominal aorta. Lower lumbar facet degenerative changes. IMPRESSION: Malalignment, not significantly changed since February 27, 2016 as described above. No acute interval changes. Electronically Signed   By: Dorise Bullion III M.D   On: 12/25/2019 18:33   DG Si Joints  Result Date: 12/25/2019 CLINICAL DATA:  Pain EXAM: BILATERAL SACROILIAC JOINTS - 3+ VIEW COMPARISON:  None. FINDINGS: Mild degenerative changes in the SI joints. No bony erosions. No fractures. Severe degenerative changes and protrusio acetabuli seen on the left, further described on the x-ray of the left hip from today. IMPRESSION: Mild degenerative changes in the inferior SI joints. Severe degenerative changes in the left hip. Electronically Signed   By: Dorise Bullion III M.D   On: 12/25/2019 18:34   DG HIP UNILAT WITH PELVIS 2-3 VIEWS LEFT  Result Date: 12/25/2019 CLINICAL DATA:  Left hip pain. EXAM: DG HIP (WITH OR WITHOUT PELVIS) 2-3V LEFT COMPARISON:  None. FINDINGS: Severe degenerative changes in the left hip with complete loss of joint space and subchondral cysts in the femoral head and adjacent acetabulum. 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 No fracture. IMPRESSION: Severe degenerative changes in the left hip with complete loss of joint space, subchondral cysts, and resulting protrusio acetabuli. Electronically Signed   By: Dorise Bullion III M.D   On: 12/25/2019 18:30     Vitals Temp:  [97.5 F (36.4 C)-98.3 F (36.8 C)] 97.8 F (36.6 C) (06/20  0853) Pulse Rate:  [78-102] 94 (06/20 0853) Resp:  [13-16] 13 (06/20 0853) BP: (121-158)/(64-98) 156/84 (06/20 0853) SpO2:  [82 %-100 %] 100 % (06/20 0853) Body mass index is 30.23 kg/m.  Physical Exam: Patient is an elderly white female who appears sedated during this morning's evaluation.  Her husband and daughter are at the bedside.  She describes having pain diffuse about the left hip as well as broadly across the lower back and buttocks bilaterally.  She tolerates gentle motion of the right lower extremity with minimal complaints of pain.  She does complain of significant left hip pain with manipulation of the left hip including rotation and flexion.  She remains grossly neurovascular intact distally in both lower extremities.  Radiographs  Left hip films show advanced osteoarthritis with significant protrusio.  Lumbar spine demonstrates severe degenerative disc disease with degenerative spondylolisthesis and spondylosis.     Assessment/Plan: Impression:  #1 intractable left hip/low back pain likely multifactorial secondary to both severe degenerative lumbar spondylosis and spondylolisthesis as well as left hip osteoarthritis.  2.  Recent altered mental status likely secondary to analgesics/meds   Treatment:  I have spoken with the patient as well as her  husband and daughter regarding ongoing treatment options.  Of also spoken with Dr. Cathlean Sauer of the hospitalist service.  Given the fact that she had very little symptomatic improvement after a recent intra-articular left hip injection as well as trochanteric bursa injection I do have concerns that her diffuse lower back and buttock pain may be coming from her degenerative spinal condition.  As such we will request a trial of an epidural steroid injection to be performed by the interventional radiology service.  During the interim she may mobilize as her symptoms allow.  We will relay to Dr. Anne Fu team Ms. Umar's recent  hospitalization.   Donalda Job M Margree Gimbel 12/26/2019, 9:50 AM  Contact # [phone]

## 2019-12-26 NOTE — Plan of Care (Signed)

## 2019-12-27 DIAGNOSIS — M25559 Pain in unspecified hip: Secondary | ICD-10-CM

## 2019-12-27 DIAGNOSIS — N179 Acute kidney failure, unspecified: Secondary | ICD-10-CM

## 2019-12-27 DIAGNOSIS — M549 Dorsalgia, unspecified: Secondary | ICD-10-CM

## 2019-12-27 LAB — BASIC METABOLIC PANEL
Anion gap: 11 (ref 5–15)
BUN: 49 mg/dL — ABNORMAL HIGH (ref 8–23)
CO2: 25 mmol/L (ref 22–32)
Calcium: 9.6 mg/dL (ref 8.9–10.3)
Chloride: 96 mmol/L — ABNORMAL LOW (ref 98–111)
Creatinine, Ser: 1.58 mg/dL — ABNORMAL HIGH (ref 0.44–1.00)
GFR calc Af Amer: 34 mL/min — ABNORMAL LOW (ref >60)
GFR calc non Af Amer: 29 mL/min — ABNORMAL LOW (ref >60)
Glucose, Bld: 161 mg/dL — ABNORMAL HIGH (ref 70–99)
Potassium: 4.7 mmol/L (ref 3.5–5.1)
Sodium: 132 mmol/L — ABNORMAL LOW (ref 135–145)

## 2019-12-27 LAB — CBC
HCT: 36 % (ref 36.0–46.0)
Hemoglobin: 12 g/dL (ref 12.0–15.0)
MCH: 30.5 pg (ref 26.0–34.0)
MCHC: 33.3 g/dL (ref 30.0–36.0)
MCV: 91.6 fL (ref 80.0–100.0)
Platelets: 194 10*3/uL (ref 150–400)
RBC: 3.93 MIL/uL (ref 3.87–5.11)
RDW: 13 % (ref 11.5–15.5)
WBC: 7.9 10*3/uL (ref 4.0–10.5)
nRBC: 0 % (ref 0.0–0.2)

## 2019-12-27 LAB — GLUCOSE, CAPILLARY
Glucose-Capillary: 138 mg/dL — ABNORMAL HIGH (ref 70–99)
Glucose-Capillary: 137 mg/dL — ABNORMAL HIGH (ref 70–99)
Glucose-Capillary: 191 mg/dL — ABNORMAL HIGH (ref 70–99)
Glucose-Capillary: 220 mg/dL — ABNORMAL HIGH (ref 70–99)

## 2019-12-27 LAB — VITAMIN D 25 HYDROXY (VIT D DEFICIENCY, FRACTURES): Vit D, 25-Hydroxy: 39.86 ng/mL (ref 30–100)

## 2019-12-27 MED ORDER — LACTATED RINGERS IV SOLN: INTRAVENOUS | Status: DC

## 2019-12-27 NOTE — Progress Notes (Addendum)
PROGRESS NOTE    AHUVA POYNOR  QQP:[id number] DOB: 1930/05/17 DOA: 12/25/2019 PCP: Burnis Medin, MD    Brief Narrative:  Patient admitted to the hospital with a working diagnosis of intractable pain due to severe osteoarthritis in the left hip.  86 year old female with significant past medical history for spinal stenosis, chronic kidney disease stage IIIb, type 2 diabetes mellitus, dyslipidemia, history of breast cancer and osteoarthritis. Patient reported worsening left hip pain for the last 2 to 3 weeks, more severe over the last 4 days.  As an outpatient her pain has been refractive to medical therapy with nonsteroidal anti-inflammatory agents and local injection by orthopedics. On her initial physical examination her heart rate was 78, respiratory rate 16, blood pressure 127/70, oxygen saturation 98%.  Her lungs are clear to auscultation bilaterally, heart S1-S2, present and rhythmic, soft abdomen, no lower extremity edema.  Positive tenderness at the lumbosacral region on the left side. Sodium 137, potassium 4.7, chloride 97, bicarb 30, glucose 186, BUN 39, creatinine 1.26, white cell count 7.4, hemoglobin 12.9, hematocrit 39.0, platelets 211.  SARS COVID-19 negative. Left hip films with severe degenerative changes in the left hip with complete loss of joint space, subchondral cyst and resultant protrusion acetabuli.  EKG 103 bpm, left axis deviation, left anterior fascicular block, right bundle branch block, sinus rhythm, poor R wave progression, no ST segment or T wave changes  Patient placed on analgesic therapy with improvement in her symptoms, but not yet back to baseline. Continue to have limited mobility.   Assessment & Plan:   Principal Problem:   Intractable pain/ left hip osteoarthritis Active Problems:   Hypothyroidism   Dyslipidemia   ANXIETY DEPRESSION   GERD   Osteoarthritis   Hip pain   Severe back pain   AKI (acute kidney injury) ([place])    1. Intractable  left hip pain due to osteoarthritis. Patient has failed outpatient therapy. Improved pain control but not yet back to baseline.   Continue analgesic regimen with: acetaminophen to 650 mg q 6 H scheduled, qid topical diclofenac, low dose prednisone and Oxycodone as need for break through pain (had 2 doses so far this am).   Patient for steroid spinal injection per IR, following orthopedic recommendations. Follow with PT and OT evaluation. Out of bed tid to chair with meals as tolerated.   Continue bowel regimen with daily bisacodyl.   2. Acute metabolic and toxic encephalopathy. Her confusion has resolved, this am she is back to baseline.   Continue to avoid phenergan.   3. Uncontrolled T2DM (Hgb A1c 7,3) with dyslipidemia. Fasting glucose this am is 161, patient with very poor oral intake, continue glucose cover and monitoring with insulin sliding scale.   On atorvastatin.   4. NEW AKI on CKD stage 3b/ hyponatremia. Worsening renal function with serum cr at 1,58, K at 4,7 and serum bicarbonate at 27, Na 132. Patient with very poor oral intake.   Will add IV fluids with balanced electrolyte solutions at 75 ml per H. Will follow on renal panel in am, avoid nephrotoxic medications.   Patient continue to be at high risk for worsening renal function and articular pain   Status is: Inpatient  Remains inpatient appropriate because:IV treatments appropriate due to intensity of illness or inability to take PO   Dispo: The patient is from: Home              Anticipated d/c is to: Home  Anticipated d/c date is: 2 days              Patient currently is not medically stable to d/c.   DVT prophylaxis: Enoxaparin   Code Status:    full  Family Communication:  I spoke with patient's daughter and husband at the bedside, we talked in detail about patient's condition, plan of care and prognosis and all questions were addressed.     Consultants:   Orthopedics  IR     Subjective: Patient continue to have left hip pain worse with movement, improved from yesterday but not back to baseline. Continue to have limited mobility.   Objective: Vitals:   12/26/19 2058 12/27/19 0519 12/27/19 0822 12/27/19 1028  BP: 139/69 (!) 177/83 (!) 161/81 (!) 148/76  Pulse: 96 83 78 87  Resp: 16 19    Temp: 97.8 F (36.6 C) 98 F (36.7 C)    TempSrc: Oral Oral    SpO2: 96% 99%    Weight:      Height:        Intake/Output Summary (Last 24 hours) at 12/27/2019 1143 Last data filed at 12/27/2019 1042 Gross per 24 hour  Intake 560 ml  Output 1325 ml  Net -765 ml   Filed Weights   12/25/19 0922  Weight: 72.6 kg    Examination:   General: Not in pain or dyspnea, deconditioned  Neurology: Awake and alert, non focal  E ENT: mild pallor, no icterus, oral mucosa moist Cardiovascular: No JVD. S1-S2 present, rhythmic, no gallops, rubs, or murmurs. No lower extremity edema. Pulmonary: positive breath sounds bilaterally, adequate air movement, no wheezing, rhonchi or rales. Gastrointestinal. Abdomen with no organomegaly, non tender, no rebound or guarding Skin. No rashes Musculoskeletal: tenderness to left hip palpation. Limited range of motion.      Data Reviewed: I have personally reviewed following labs and imaging studies  CBC: Recent Labs  Lab 12/25/19 1539 12/26/19 0603 12/27/19 0405  WBC 7.4 6.0 7.9  NEUTROABS 5.5  --   --   HGB 12.9 11.9* 12.0  HCT 39.0 36.3 36.0  MCV 94.2 94.3 91.6  PLT 211 201 196   Basic Metabolic Panel: Recent Labs  Lab 12/25/19 1539 12/26/19 0603 12/27/19 0405  NA 137 133* 132*  K 4.7 5.2* 4.7  CL 97* 98 96*  CO2 30 28 25   GLUCOSE 186* 216* 161*  BUN 39* 39* 49*  CREATININE 1.26* 1.20* 1.58*  CALCIUM 9.5 9.5 9.6   GFR: Estimated Creatinine Clearance: 22.8 mL/min (A) (by C-G formula based on SCr of 1.58 mg/dL (H)). Liver Function Tests: No results for input(s): AST, ALT, ALKPHOS, BILITOT, PROT, ALBUMIN in  the last 168 hours. No results for input(s): LIPASE, AMYLASE in the last 168 hours. No results for input(s): AMMONIA in the last 168 hours. Coagulation Profile: Recent Labs  Lab 12/25/19 2114  INR 1.0   Cardiac Enzymes: No results for input(s): CKTOTAL, CKMB, CKMBINDEX, TROPONINI in the last 168 hours. BNP (last 3 results) No results for input(s): PROBNP in the last 8760 hours. HbA1C: Recent Labs    12/25/19 1843  HGBA1C 7.3*   CBG: Recent Labs  Lab 12/26/19 0747 12/26/19 1146 12/26/19 1709 12/26/19 2117 12/27/19 0752  GLUCAP 241* 161* 211* 246* 138*   Lipid Profile: No results for input(s): CHOL, HDL, LDLCALC, TRIG, CHOLHDL, LDLDIRECT in the last 72 hours. Thyroid Function Tests: No results for input(s): TSH, T4TOTAL, FREET4, T3FREE, THYROIDAB in the last 72 hours. Anemia Panel: No results  for input(s): VITAMINB12, FOLATE, FERRITIN, TIBC, IRON, RETICCTPCT in the last 72 hours.    Radiology Studies: I have reviewed all of the imaging during this hospital visit personally     Scheduled Meds: . acetaminophen  650 mg Oral Q6H  . aspirin EC  81 mg Oral Daily  . atorvastatin  10 mg Oral q1800  . bisacodyl  5 mg Oral Daily  . calcium-vitamin D  1 tablet Oral BID  . Chlorhexidine Gluconate Cloth  6 each Topical Daily  . cholecalciferol  2,000 Units Oral q morning - 10a  . diclofenac Sodium  2 g Topical QID  . enoxaparin (LOVENOX) injection  30 mg Subcutaneous Q24H  . insulin aspart  0-9 Units Subcutaneous TID WC  . pantoprazole  40 mg Oral Daily  . predniSONE  20 mg Oral Q breakfast  . cyanocobalamin  2,000 mcg Oral Daily   Continuous Infusions:   LOS: 1 day        Laurance Heide Gerome Apley, MD

## 2019-12-28 LAB — BASIC METABOLIC PANEL
Anion gap: 9 (ref 5–15)
BUN: 48 mg/dL — ABNORMAL HIGH (ref 8–23)
CO2: 27 mmol/L (ref 22–32)
Calcium: 9.2 mg/dL (ref 8.9–10.3)
Chloride: 99 mmol/L (ref 98–111)
Creatinine, Ser: 1.47 mg/dL — ABNORMAL HIGH (ref 0.44–1.00)
GFR calc Af Amer: 37 mL/min — ABNORMAL LOW (ref >60)
GFR calc non Af Amer: 32 mL/min — ABNORMAL LOW (ref >60)
Glucose, Bld: 166 mg/dL — ABNORMAL HIGH (ref 70–99)
Potassium: 4.9 mmol/L (ref 3.5–5.1)
Sodium: 135 mmol/L (ref 135–145)

## 2019-12-28 LAB — GLUCOSE, CAPILLARY
Glucose-Capillary: 137 mg/dL — ABNORMAL HIGH (ref 70–99)
Glucose-Capillary: 192 mg/dL — ABNORMAL HIGH (ref 70–99)
Glucose-Capillary: 217 mg/dL — ABNORMAL HIGH (ref 70–99)
Glucose-Capillary: 209 mg/dL — ABNORMAL HIGH (ref 70–99)

## 2019-12-28 NOTE — Progress Notes (Signed)
Subjective: Patient still complains of moderate to severe pain left lower back and buttock area. No groin or anterior thigh pain   Objective: Vital signs in last 24 hours: Temp:  [97.8 F (36.6 C)-98.2 F (36.8 C)] 98.2 F (36.8 C) (06/22 0430) Pulse Rate:  [78-87] 81 (06/22 0515) Resp:  [18] 18 (06/22 0430) BP: (132-173)/(70-81) 134/71 (06/22 0515) SpO2:  [97 %-99 %] 99 % (06/22 0430)  Intake/Output from previous day: 06/21 0701 - 06/22 0700 In: 1591.2 [P.O.:485; I.V.:1106.2] Out: 1150 [Urine:1150] Intake/Output this shift: Total I/O In: 958.9 [I.V.:958.9] Out: 300 [Urine:300]  Recent Labs    12/25/19 1539 12/26/19 0603 12/27/19 0405  HGB 12.9 11.9* 12.0   Recent Labs    12/26/19 0603 12/27/19 0405  WBC 6.0 7.9  RBC 3.85* 3.93  HCT 36.3 36.0  PLT 201 194   Recent Labs    12/27/19 0405 12/28/19 0323  NA 132* 135  K 4.7 4.9  CL 96* 99  CO2 25 27  BUN 49* 48*  CREATININE 1.58* 1.47*  GLUCOSE 161* 166*  CALCIUM 9.6 9.2   Recent Labs    12/25/19 2114  INR 1.0    Neurologically intact Neurovascular intact Tender left lower lumbar paraspinals and SI joint    Assessment/Plan: Low back pain- She has significant degenerative changes and spinal stenosis. AN ESI has been ordered and hopefully will be done today and be beneficial. I have reiterated to her that the current problem is not coming from her hip.   [name] 12/28/2019, 6:42 AM

## 2019-12-28 NOTE — Evaluation (Signed)
Physical Therapy Evaluation Patient Details Name: [name] MRN: [id number] DOB: 07-Jan-1930 Today's Date: 12/28/2019   History of Present Illness  86 year old female with significant past medical history for spinal stenosis, chronic kidney disease stage IIIb, type 2 diabetes mellitus, dyslipidemia, history of breast cancer and osteoarthritis.Patient reported worsening left hip pain for the last 2 to 3 weeks, more severe over the last 4 days.  As an outpatient her pain has been refractive to medical therapy with nonsteroidal anti-inflammatory agents and local injection by orthopedics  Clinical Impression  The patient moved slowly but did mobilize to bed edge and took a few steps to recliner with 1 min assist. Patient reports recent nausea when moving" Motion sickness " but did not report any at this time. Patient's daughter present. Patient actually reports moving better with less pain. Pt admitted with above diagnosis.  Pt currently with functional limitations due to the deficits listed below (see PT Problem List). Pt will benefit from skilled PT to increase their independence and safety with mobility to allow discharge to the venue listed below.    Daughter confirms to return home.      Follow Up Recommendations Home health PT    Equipment Recommendations  None recommended by PT    Recommendations for Other Services       Precautions / Restrictions Precautions Precautions: Fall Precaution Comments: reports dizziness and nausea      Mobility  Bed Mobility Overal bed mobility: Needs Assistance Bed Mobility: Rolling;Sidelying to Sit Rolling: Min guard Sidelying to sit: Min assist       General bed mobility comments: multimodal cues for precautions for back, to roll, able to use rail, slide legs to bed edge and sit froe dielying with min assist.  Transfers Overall transfer level: Needs assistance Equipment used: Rolling walker (2 wheeled) Transfers: Sit to/from Stand Sit  to Stand: Min assist         General transfer comment: Cues to stand from bed at RW, stood and held left leg up stating the leg was hurting. small steps x 5 to recliner.  Ambulation/Gait Ambulation/Gait assistance: Min assist Gait Distance (Feet): 5 Feet Assistive device: Rolling walker (2 wheeled) Gait Pattern/deviations: Step-to pattern     General Gait Details: very slow to move, antalgic on LLE.  Stairs            Wheelchair Mobility    Modified Rankin (Stroke Patients Only)       Balance Overall balance assessment: Needs assistance Sitting-balance support: Bilateral upper extremity supported;Feet supported Sitting balance-Leahy Scale: Fair     Standing balance support: Bilateral upper extremity supported;During functional activity Standing balance-Leahy Scale: Poor Standing balance comment: reliant on UE's                             Pertinent Vitals/Pain Pain Assessment: Faces Faces Pain Scale: Hurts little more Pain Location: left hip/back Pain Descriptors / Indicators: Burning;Discomfort;Radiating;Pressure Pain Intervention(s): Monitored during session;Premedicated before session;Limited activity within patient's tolerance    Home Living Family/patient expects to be discharged to:: Private residence Living Arrangements: Spouse/significant other;Children Available Help at Discharge: Family Type of Home: House Home Access: Stairs to enter Entrance Stairs-Rails: Right Entrance Stairs-Number of Steps: 3+1 Home Layout: One level Home Equipment: Environmental consultant - 2 wheels;Bedside commode      Prior Function Level of Independence: Independent with assistive device(s)               Hand  Dominance        Extremity/Trunk Assessment        Lower Extremity Assessment Lower Extremity Assessment: Generalized weakness    Cervical / Trunk Assessment Cervical / Trunk Assessment: Normal  Communication   Communication: No difficulties   Cognition Arousal/Alertness: Awake/alert Behavior During Therapy: WFL for tasks assessed/performed Overall Cognitive Status: Within Functional Limits for tasks assessed                                        General Comments      Exercises     Assessment/Plan    PT Assessment Patient needs continued PT services  PT Problem List Decreased strength;Decreased balance;Decreased mobility;Decreased knowledge of use of DME;Decreased activity tolerance;Decreased safety awareness;Pain       PT Treatment Interventions DME instruction;Therapeutic activities;Gait training;Therapeutic exercise;Functional mobility training;Stair training;Patient/family education    PT Goals (Current goals can be found in the Care Plan section)  Acute Rehab PT Goals Patient Stated Goal: to not have pain and walk PT Goal Formulation: With patient/family Time For Goal Achievement: 01/11/20 Potential to Achieve Goals: Good    Frequency Min 3X/week   Barriers to discharge        Co-evaluation PT/OT/SLP Co-Evaluation/Treatment: Yes Reason for Co-Treatment: For patient/therapist safety;To address functional/ADL transfers PT goals addressed during session: Mobility/safety with mobility OT goals addressed during session: ADL's and self-care       AM-PAC PT "6 Clicks" Mobility  Outcome Measure Help needed turning from your back to your side while in a flat bed without using bedrails?: A Little Help needed moving from lying on your back to sitting on the side of a flat bed without using bedrails?: A Little Help needed moving to and from a bed to a chair (including a wheelchair)?: A Little Help needed standing up from a chair using your arms (e.g., wheelchair or bedside chair)?: A Little Help needed to walk in hospital room?: A Lot Help needed climbing 3-5 steps with a railing? : Total 6 Click Score: 15    End of Session Equipment Utilized During Treatment: Gait belt Activity Tolerance:  Patient tolerated treatment well Patient left: in chair;with call bell/phone within reach;with chair alarm set;with family/visitor present Nurse Communication: Mobility status PT Visit Diagnosis: Unsteadiness on feet (R26.81);Difficulty in walking, not elsewhere classified (R26.2);Pain Pain - Right/Left: Left Pain - part of body: Leg    Time: 1610-9604 PT Time Calculation (min) (ACUTE ONLY): 31 min   Charges:   PT Evaluation $PT Eval Low Complexity: [place] [facility] Acute Rehabilitation Services Pager [phone] Office [phone]   [name] 12/28/2019, 1:05 PM

## 2019-12-28 NOTE — Evaluation (Signed)
Occupational Therapy Evaluation Patient Details Name: [name] MRN: [id number] DOB: 06/10/1930 Today's Date: 12/28/2019    History of Present Illness 86 year old female with significant past medical history for spinal stenosis, chronic kidney disease stage IIIb, type 2 diabetes mellitus, dyslipidemia, history of breast cancer and osteoarthritis.Patient reported worsening left hip pain for the last 2 to 3 weeks, more severe over the last 4 days.  As an outpatient her pain has been refractive to medical therapy with nonsteroidal anti-inflammatory agents and local injection by orthopedics   Clinical Impression   Patient with functional deficits listed below impacting safety and independence with self care. Patient min A for functional transfers, set up A for UB ADL and mod A for LB ADL due to pain in L back, hip and radiates down L LE extremity. Cues for log roll in bed and for body mechanics during functional transfer. DTR/pt declining any SNF services, recommend HH with 24/7 assist/supervision.    Follow Up Recommendations  Home health OT;Supervision/Assistance - 24 hour    Equipment Recommendations  None recommended by OT       Precautions / Restrictions Precautions Precautions: Fall Precaution Comments: reports dizziness and nausea Restrictions Weight Bearing Restrictions: No      Mobility Bed Mobility Overal bed mobility: Needs Assistance Bed Mobility: Rolling;Sidelying to Sit Rolling: Min guard Sidelying to sit: Min assist       General bed mobility comments: multimodal cues for precautions for back, to roll, able to use rail, slide legs to bed edge and sit froe dielying with min assist.  Transfers Overall transfer level: Needs assistance Equipment used: Rolling walker (2 wheeled) Transfers: Sit to/from Stand Sit to Stand: Min assist         General transfer comment: Cues to stand from bed at RW, stood and held left leg up stating the leg was hurting. small  steps x 5 to recliner.    Balance Overall balance assessment: Needs assistance Sitting-balance support: Bilateral upper extremity supported;Feet supported Sitting balance-Leahy Scale: Fair     Standing balance support: Bilateral upper extremity supported;During functional activity Standing balance-Leahy Scale: Poor Standing balance comment: reliant on UE's                           ADL either performed or assessed with clinical judgement   ADL Overall ADL's : Needs assistance/impaired     Grooming: Set up;Sitting   Upper Body Bathing: Set up;Sitting   Lower Body Bathing: Moderate assistance;Sitting/lateral leans;Sit to/from stand   Upper Body Dressing : Set up;Sitting   Lower Body Dressing: Moderate assistance;Sitting/lateral leans;Sit to/from stand   Toilet Transfer: Minimal assistance;Cueing for safety;Cueing for sequencing;BSC;RW;Ambulation Toilet Transfer Details (indicate cue type and reason): to recliner Toileting- Clothing Manipulation and Hygiene: Moderate assistance;Sit to/from stand;Sitting/lateral lean       Functional mobility during ADLs: Minimal assistance;Rolling walker;Cueing for safety;Cueing for sequencing General ADL Comments: patient requiring increased assistance and time for mobility + self care due to pain, decreased activity tolerance     Vision Baseline Vision/History: Wears glasses Wears Glasses: At all times Patient Visual Report: No change from baseline              Pertinent Vitals/Pain Pain Assessment: 0-10 Pain Score: 7  Faces Pain Scale: Hurts little more Pain Location: left hip/back Pain Descriptors / Indicators: Burning;Discomfort;Radiating;Pressure Pain Intervention(s): Premedicated before session;Repositioned;Monitored during session     Hand Dominance Right   Extremity/Trunk Assessment Upper Extremity Assessment Upper  Extremity Assessment: Generalized weakness   Lower Extremity Assessment Lower Extremity  Assessment: Defer to PT evaluation   Cervical / Trunk Assessment Cervical / Trunk Assessment: Normal   Communication Communication Communication: No difficulties   Cognition Arousal/Alertness: Awake/alert Behavior During Therapy: WFL for tasks assessed/performed Overall Cognitive Status: Within Functional Limits for tasks assessed                                     General Comments  maintain 93-94% on room air            Home Living Family/patient expects to be discharged to:: Private residence Living Arrangements: Spouse/significant other Available Help at Discharge: Family Type of Home: House Home Access: Stairs to enter Technical brewer of Steps: 3+1 Entrance Stairs-Rails: Right;Left Home Layout: One level     Bathroom Shower/Tub: Occupational psychologist: Handicapped height     Home Equipment: Environmental consultant - 2 wheels;Bedside commode;Shower seat          Prior Functioning/Environment Level of Independence: Independent with assistive device(s)        Comments: pt reports using no AD until last week when pain started, then used FWW        OT Problem List: Decreased activity tolerance;Impaired balance (sitting and/or standing);Decreased safety awareness;Pain      OT Treatment/Interventions: Self-care/ADL training;Therapeutic exercise;Energy conservation;DME and/or AE instruction;Therapeutic activities;Patient/family education;Balance training    OT Goals(Current goals can be found in the care plan section) Acute Rehab OT Goals Patient Stated Goal: to not have pain and walk OT Goal Formulation: With patient/family Time For Goal Achievement: 01/11/20 Potential to Achieve Goals: Good  OT Frequency: Min 2X/week           Co-evaluation PT/OT/SLP Co-Evaluation/Treatment: Yes Reason for Co-Treatment: To address functional/ADL transfers PT goals addressed during session: Mobility/safety with mobility OT goals addressed during  session: ADL's and self-care      AM-PAC OT "6 Clicks" Daily Activity     Outcome Measure Help from another person eating meals?: None Help from another person taking care of personal grooming?: A Little Help from another person toileting, which includes using toliet, bedpan, or urinal?: A Lot Help from another person bathing (including washing, rinsing, drying)?: A Lot Help from another person to put on and taking off regular upper body clothing?: A Little Help from another person to put on and taking off regular lower body clothing?: A Lot 6 Click Score: 16   End of Session Equipment Utilized During Treatment: Rolling walker Nurse Communication: Mobility status  Activity Tolerance: Patient limited by pain;Patient tolerated treatment well Patient left: in chair;with call bell/phone within reach;with chair alarm set;with family/visitor present  OT Visit Diagnosis: Other abnormalities of gait and mobility (R26.89);Pain Pain - Right/Left: Left Pain - part of body: Leg;Hip (back)                Time: 0962-8366 OT Time Calculation (min): 31 min Charges:  OT General Charges $OT Visit: 1 Visit OT Evaluation $OT Eval Moderate Complexity: [place] OT Pager: [place] 12/28/2019, 1:56 PM

## 2019-12-28 NOTE — Progress Notes (Signed)
PROGRESS NOTE    [name]  JIR:[id number] DOB: 1930/04/09 DOA: 12/25/2019 PCP: Burnis Medin, MD    Brief Narrative:  Patient admitted to the hospital with a working diagnosis of intractable pain due to severe osteoarthritis in the left hip/ spine.  86 year old female with significant past medical history for spinal stenosis, chronic kidney disease stage IIIb, type 2 diabetes mellitus, dyslipidemia, history of breast cancer and osteoarthritis. Patient reported worsening left hip pain for the last 2 to 3 weeks,more severe over the last 4 days. As an outpatient her pain has been refractive to medical therapy with nonsteroidal anti-inflammatory agents and local injection by orthopedics. On her initial physical examination her heart rate was 78, respiratory rate 16, blood pressure 127/70, oxygen saturation 98%. Her lungs were clear to auscultation bilaterally, heart S1-S2, present and rhythmic, soft abdomen, no lower extremity edema. Positive tenderness at the lumbosacral region on the left side. Sodium 137, potassium 4.7, chloride 97, bicarb 30, glucose 186, BUN 39, creatinine 1.26, white cell count 7.4, hemoglobin 12.9, hematocrit 39.0, platelets 211. SARS COVID-19 negative. Left hip films with severe degenerative changes in the left hip with complete loss of joint space, subchondral cyst and resultant protrusion acetabuli. EKG 103 bpm, left axis deviation, left anterior fascicular block, right bundle branch block, sinus rhythm, poor R wave progression, no ST segment or T wave changes  Patient placed on analgesic therapy with improvement in her symptoms, but not yet back to baseline. Continue to have limited mobility.   Orthopedics recommended local spine injection, but IR not able to do procedure as inpatient.     Assessment & Plan:   Principal Problem:   Intractable pain/ left hip osteoarthritis Active Problems:   Hypothyroidism   Dyslipidemia   ANXIETY DEPRESSION    GERD   Osteoarthritis   Hip pain   Severe back pain   AKI (acute kidney injury) ([place])   1. Intractable left hip pain due to osteoarthritis.Patient has failed outpatient therapy. Slowly improving but not yet back to baseline. This am is out of bed to chair with help of physical therapy.   Tolerating well analgesic regimen with: acetaminophen  650 mg q 6 H scheduled, qid topical diclofenac, and low dose prednisone. PRN Oxycodone  for break through pain.  IR not able to perform local spine injection. PT and OT recommendation for home with home health services.  Will plan to discharge home when pain improved to a poing where patient can be mobile within her home and not bedridden.    Bowel regimen with daily bisacodyl.   2. Acute metabolic and toxic encephalopathy. Due to phenergan bolus injection. Clinically resolved.   3. Uncontrolled T2DM (Hgb A1c 7,3) with dyslipidemia. Today's fasting glucose this am is 166, improved oral intake. Continue insulin sliding scale for glucose cover and monitoring.    Continue with atorvastatin.   4. NEW AKI on CKD stage 3b/ hyponatremia. Renal function with serum cr down to 1,47 form 1,58, her K is 4,9 and serum bicarbonate at 27.   Will continue IV fluids, will decrease rate to 50 ml per H, continue to encourage po intake. Follow up renal panel in am, avoid hypotension and nephrotoxic medications.    Status is: Inpatient  Remains inpatient appropriate because:IV treatments appropriate due to intensity of illness or inability to take PO   Dispo: The patient is from: Home              Anticipated d/c is to: Home  Anticipated d/c date is: 1 day              Patient currently is not medically stable to d/c.    DVT prophylaxis: Enoxaparin   Code Status:   full  Family Communication:  I spoke with patient's husband at the bedside, we talked in detail about patient's condition, plan of care and prognosis and all questions were  addressed.     Consultants:   orthopedics     Subjective: Patient with persistent pain at the left side, it has improved but not yet back to baseline, continue to have difficulty with mobility,   Objective: Vitals:   12/28/19 0430 12/28/19 0515 12/28/19 0744 12/28/19 1145  BP: (!) 173/80 134/71 (!) 154/67 (!) 141/66  Pulse: 87 81 78 81  Resp: 18  18 19   Temp: 98.2 F (36.8 C)  98.1 F (36.7 C) 97.6 F (36.4 C)  TempSrc: Oral  Oral Oral  SpO2: 99%  100% 94%  Weight:      Height:        Intake/Output Summary (Last 24 hours) at 12/28/2019 1457 Last data filed at 12/28/2019 1300 Gross per 24 hour  Intake 1466.24 ml  Output 1100 ml  Net 366.24 ml   Filed Weights   12/25/19 0922  Weight: 72.6 kg    Examination:   General: Not in pain or dyspnea.  Neurology: Awake and alert, non focal  E ENT: mild pallor, no icterus, oral mucosa moist Cardiovascular: No JVD. S1-S2 present, rhythmic, no gallops, rubs, or murmurs. No lower extremity edema. Pulmonary: positive breath sounds bilaterally, adequate air movement, no wheezing, rhonchi or rales. Gastrointestinal. Abdomen with, no organomegaly, non tender, no rebound or guarding Skin. No rashes Musculoskeletal: no joint deformities     Data Reviewed: I have personally reviewed following labs and imaging studies  CBC: Recent Labs  Lab 12/25/19 1539 12/26/19 0603 12/27/19 0405  WBC 7.4 6.0 7.9  NEUTROABS 5.5  --   --   HGB 12.9 11.9* 12.0  HCT 39.0 36.3 36.0  MCV 94.2 94.3 91.6  PLT 211 201 782   Basic Metabolic Panel: Recent Labs  Lab 12/25/19 1539 12/26/19 0603 12/27/19 0405 12/28/19 0323  NA 137 133* 132* 135  K 4.7 5.2* 4.7 4.9  CL 97* 98 96* 99  CO2 30 28 25 27   GLUCOSE 186* 216* 161* 166*  BUN 39* 39* 49* 48*  CREATININE 1.26* 1.20* 1.58* 1.47*  CALCIUM 9.5 9.5 9.6 9.2   GFR: Estimated Creatinine Clearance: 24.6 mL/min (A) (by C-G formula based on SCr of 1.47 mg/dL (H)). Liver Function  Tests: No results for input(s): AST, ALT, ALKPHOS, BILITOT, PROT, ALBUMIN in the last 168 hours. No results for input(s): LIPASE, AMYLASE in the last 168 hours. No results for input(s): AMMONIA in the last 168 hours. Coagulation Profile: Recent Labs  Lab 12/25/19 2114  INR 1.0   Cardiac Enzymes: No results for input(s): CKTOTAL, CKMB, CKMBINDEX, TROPONINI in the last 168 hours. BNP (last 3 results) No results for input(s): PROBNP in the last 8760 hours. HbA1C: Recent Labs    12/25/19 1843  HGBA1C 7.3*   CBG: Recent Labs  Lab 12/27/19 1146 12/27/19 1640 12/27/19 2055 12/28/19 0752 12/28/19 1140  GLUCAP 137* 191* 220* 137* 192*   Lipid Profile: No results for input(s): CHOL, HDL, LDLCALC, TRIG, CHOLHDL, LDLDIRECT in the last 72 hours. Thyroid Function Tests: No results for input(s): TSH, T4TOTAL, FREET4, T3FREE, THYROIDAB in the last 72 hours. Anemia Panel:  No results for input(s): VITAMINB12, FOLATE, FERRITIN, TIBC, IRON, RETICCTPCT in the last 72 hours.    Radiology Studies: I have reviewed all of the imaging during this hospital visit personally     Scheduled Meds: . acetaminophen  650 mg Oral Q6H  . aspirin EC  81 mg Oral Daily  . atorvastatin  10 mg Oral q1800  . bisacodyl  5 mg Oral Daily  . calcium-vitamin D  1 tablet Oral BID  . Chlorhexidine Gluconate Cloth  6 each Topical Daily  . cholecalciferol  2,000 Units Oral q morning - 10a  . diclofenac Sodium  2 g Topical QID  . enoxaparin (LOVENOX) injection  30 mg Subcutaneous Q24H  . insulin aspart  0-9 Units Subcutaneous TID WC  . pantoprazole  40 mg Oral Daily  . predniSONE  20 mg Oral Q breakfast  . cyanocobalamin  2,000 mcg Oral Daily   Continuous Infusions: . lactated ringers 75 mL/hr at 12/28/19 1357     LOS: 2 days        Ilse Billman Gerome Apley, MD

## 2019-12-29 LAB — GLUCOSE, CAPILLARY
Glucose-Capillary: 125 mg/dL — ABNORMAL HIGH (ref 70–99)
Glucose-Capillary: 247 mg/dL — ABNORMAL HIGH (ref 70–99)
Glucose-Capillary: 209 mg/dL — ABNORMAL HIGH (ref 70–99)
Glucose-Capillary: 190 mg/dL — ABNORMAL HIGH (ref 70–99)

## 2019-12-29 LAB — BASIC METABOLIC PANEL
Anion gap: 7 (ref 5–15)
BUN: 43 mg/dL — ABNORMAL HIGH (ref 8–23)
CO2: 28 mmol/L (ref 22–32)
Calcium: 9.2 mg/dL (ref 8.9–10.3)
Chloride: 101 mmol/L (ref 98–111)
Creatinine, Ser: 1.41 mg/dL — ABNORMAL HIGH (ref 0.44–1.00)
GFR calc Af Amer: 39 mL/min — ABNORMAL LOW (ref >60)
GFR calc non Af Amer: 33 mL/min — ABNORMAL LOW (ref >60)
Glucose, Bld: 170 mg/dL — ABNORMAL HIGH (ref 70–99)
Potassium: 4.4 mmol/L (ref 3.5–5.1)
Sodium: 136 mmol/L (ref 135–145)

## 2019-12-29 MED ORDER — MORPHINE SULFATE ER 15 MG PO TBCR
15.0000 mg | EXTENDED_RELEASE_TABLET | Freq: Two times a day (BID) | ORAL | Status: DC
  Administered 2019-12-29 – 2019-12-31 (×5): 15 mg via ORAL
  Filled 2019-12-29 (×5): qty 1

## 2019-12-29 NOTE — Care Management Important Message (Signed)
Important Message  Patient Details IM Letter given to Lennart Pall SW Case Manager to present to the Patient Name: [name] MRN: [id number] Date of Birth: Mar 31, 1930   Medicare Important Message Given:  Yes     Kerin Salen 12/29/2019, 10:03 AM

## 2019-12-29 NOTE — Progress Notes (Signed)
PROGRESS NOTE    [name]  ZOX:[id number] DOB: [birth year] DOA: 12/25/2019 PCP: Burnis Medin, MD   Brief Narrative:  86 year old female with significant past medical history for spinal stenosis, chronic kidney disease stage IIIb, type 2 diabetes mellitus, dyslipidemia, history of breast cancer and osteoarthritis.  Pain ongoing for several days not controlled with home over-the-counter medications.  Upon admission patient was hemodynamically stable.  Left hip showed complete loss of joint space started on pain medication, orthopedic consulted who recommended outpatient local steroid injection.  PT OT recommended home health    Assessment & Plan:   Principal Problem:   Intractable pain/ left hip osteoarthritis Active Problems:   Hypothyroidism   Dyslipidemia   ANXIETY DEPRESSION   GERD   Osteoarthritis   Hip pain   Severe back pain   AKI (acute kidney injury) ([place])   Intractable left hip pain due to osteoarthritis. Failed outpatient treatment with p.o. regimen.  Currently on oxycodone every 6 hours as needed.  Will add MS Contin 10 mg every 12 hours.  Bowel regimen to have at least 1 soft bowel movements daily.  Prednisone 20 mg daily. Seen by orthopedic-recommends outpatient epidural steroid injection PT/OT-recommends home health  Acute metabolic and toxic encephalopathy.   Resolved   Uncontrolled T2DM(Hgb A1c 7,3)with dyslipidemia. On daily statin blood glucose well controlled.  CKD stage 3b/ hyponatremia. Baseline creatinine 1.3.  Peaked at 1.58 during the hospitalization improving with fluids. Status is: Inpatient  Remains inpatient appropriate because: Uncontrolled pain with p.o. narcotics therefore being adjusted at this time  Dispo: The patient is from: Home  Anticipated d/c is to: Home with home health  Anticipated d/c date is: 1 day  Patient currently is not medically stable to d/c.  Currently adjustments of p.o.  narcotic further pain control    DVT prophylaxis:      Enoxaparin   Code Status:              full  Family Communication:       Daughter is at bedside      Subjective: Patient still reporting of left hip pain. "  Her pain is 20 out of 10 and only comes down to 10 out of 10 with p.o. oxycodone."   They show concern about why cannot she get epidural injection while she is in the hospital.  Review of Systems Otherwise negative except as per HPI, including: General: Denies fever, chills, night sweats or unintended weight loss. Resp: Denies cough, wheezing, shortness of breath. Cardiac: Denies chest pain, palpitations, orthopnea, paroxysmal nocturnal dyspnea. GI: Denies abdominal pain, nausea, vomiting, diarrhea or constipation GU: Denies dysuria, frequency, hesitancy or incontinence MS: Denies muscle aches, joint pain or swelling Neuro: Denies headache, neurologic deficits (focal weakness, numbness, tingling), abnormal gait Psych: Denies anxiety, depression, SI/HI/AVH Skin: Denies new rashes or lesions ID: Denies sick contacts, exotic exposures, travel  Examination:  Constitutional: Mild distress secondary to left hip pain Respiratory: Clear to auscultation bilaterally Cardiovascular: Normal sinus rhythm, no rubs Abdomen: Nontender nondistended good bowel sounds Musculoskeletal: No edema noted Skin: No rashes seen Neurologic: CN 2-12 grossly intact.  And nonfocal Psychiatric: Normal judgment and insight. Alert and oriented x 3. Normal mood.  Objective: Vitals:   12/28/19 1145 12/28/19 2014 12/29/19 0056 12/29/19 0356  BP: (!) 141/66 (!) 176/80 (!) 155/78 (!) 150/76  Pulse: 81 99  84  Resp: 19 20  19   Temp: 97.6 F (36.4 C) 97.9 F (36.6 C)  97.6 F (36.4 C)  TempSrc: Oral     SpO2: 94% 99%  94%  Weight:      Height:        Intake/Output Summary (Last 24 hours) at 12/29/2019 1224 Last data filed at 12/29/2019 1130 Gross per 24 hour  Intake 1946.16 ml  Output  1100 ml  Net 846.16 ml   Filed Weights   12/25/19 0922  Weight: 72.6 kg     Data Reviewed:   CBC: Recent Labs  Lab 12/25/19 1539 12/26/19 0603 12/27/19 0405  WBC 7.4 6.0 7.9  NEUTROABS 5.5  --   --   HGB 12.9 11.9* 12.0  HCT 39.0 36.3 36.0  MCV 94.2 94.3 91.6  PLT 211 201 202   Basic Metabolic Panel: Recent Labs  Lab 12/25/19 1539 12/26/19 0603 12/27/19 0405 12/28/19 0323 12/29/19 0337  NA 137 133* 132* 135 136  K 4.7 5.2* 4.7 4.9 4.4  CL 97* 98 96* 99 101  CO2 30 28 25 27 28   GLUCOSE 186* 216* 161* 166* 170*  BUN 39* 39* 49* 48* 43*  CREATININE 1.26* 1.20* 1.58* 1.47* 1.41*  CALCIUM 9.5 9.5 9.6 9.2 9.2   GFR: Estimated Creatinine Clearance: 25.6 mL/min (A) (by C-G formula based on SCr of 1.41 mg/dL (H)). Liver Function Tests: No results for input(s): AST, ALT, ALKPHOS, BILITOT, PROT, ALBUMIN in the last 168 hours. No results for input(s): LIPASE, AMYLASE in the last 168 hours. No results for input(s): AMMONIA in the last 168 hours. Coagulation Profile: Recent Labs  Lab 12/25/19 2114  INR 1.0   Cardiac Enzymes: No results for input(s): CKTOTAL, CKMB, CKMBINDEX, TROPONINI in the last 168 hours. BNP (last 3 results) No results for input(s): PROBNP in the last 8760 hours. HbA1C: No results for input(s): HGBA1C in the last 72 hours. CBG: Recent Labs  Lab 12/28/19 1140 12/28/19 1649 12/28/19 2115 12/29/19 0805 12/29/19 1212  GLUCAP 192* 217* 209* 125* 247*   Lipid Profile: No results for input(s): CHOL, HDL, LDLCALC, TRIG, CHOLHDL, LDLDIRECT in the last 72 hours. Thyroid Function Tests: No results for input(s): TSH, T4TOTAL, FREET4, T3FREE, THYROIDAB in the last 72 hours. Anemia Panel: No results for input(s): VITAMINB12, FOLATE, FERRITIN, TIBC, IRON, RETICCTPCT in the last 72 hours. Sepsis Labs: No results for input(s): PROCALCITON, LATICACIDVEN in the last 168 hours.  Recent Results (from the past 240 hour(s))  SARS Coronavirus 2 by RT PCR  (hospital order, performed in [facility] hospital lab) Nasopharyngeal Nasopharyngeal Swab     Status: None   Collection Time: 12/25/19  2:55 PM   Specimen: Nasopharyngeal Swab  Result Value Ref Range Status   SARS Coronavirus 2 NEGATIVE NEGATIVE Final    Comment: (NOTE) SARS-CoV-2 target nucleic acids are NOT DETECTED.  The SARS-CoV-2 RNA is generally detectable in upper and lower respiratory specimens during the acute phase of infection. The lowest concentration of SARS-CoV-2 viral copies this assay can detect is 250 copies / mL. A negative result does not preclude SARS-CoV-2 infection and should not be used as the sole basis for treatment or other patient management decisions.  A negative result may occur with improper specimen collection / handling, submission of specimen other than nasopharyngeal swab, presence of viral mutation(s) within the areas targeted by this assay, and inadequate number of viral copies (<250 copies / mL). A negative result must be combined with clinical observations, patient history, and epidemiological information.  Fact Sheet for Patients:   StrictlyIdeas.no  Fact Sheet for Healthcare Providers: BankingDealers.co.za  This test is  not yet approved or  cleared by the [place] and has been authorized for detection and/or diagnosis of SARS-CoV-2 by FDA under an Emergency Use Authorization (EUA).  This EUA will remain in effect (meaning this test can be used) for the duration of the COVID-19 declaration under Section 564(b)(1) of the Act, 21 U.S.C. section 360bbb-3(b)(1), unless the authorization is terminated or revoked sooner.  Performed at [facility], [place] [address], [place], [place] [address]          Radiology Studies: No results found.      Scheduled Meds: . acetaminophen  650 mg Oral Q6H  . aspirin EC  81 mg Oral Daily  . atorvastatin  10 mg Oral q1800  .  bisacodyl  5 mg Oral Daily  . calcium-vitamin D  1 tablet Oral BID  . Chlorhexidine Gluconate Cloth  6 each Topical Daily  . cholecalciferol  2,000 Units Oral q morning - 10a  . diclofenac Sodium  2 g Topical QID  . enoxaparin (LOVENOX) injection  30 mg Subcutaneous Q24H  . insulin aspart  0-9 Units Subcutaneous TID WC  . morphine  15 mg Oral Q12H  . pantoprazole  40 mg Oral Daily  . predniSONE  20 mg Oral Q breakfast  . cyanocobalamin  2,000 mcg Oral Daily   Continuous Infusions: . lactated ringers 50 mL/hr at 12/29/19 1005     LOS: 3 days   Time spent= 35 mins    Neco Kling Arsenio Loader, MD [facility]  If 7PM-7AM, please contact night-coverage  12/29/2019, 12:24 PM

## 2019-12-30 LAB — GLUCOSE, CAPILLARY
Glucose-Capillary: 156 mg/dL — ABNORMAL HIGH (ref 70–99)
Glucose-Capillary: 178 mg/dL — ABNORMAL HIGH (ref 70–99)
Glucose-Capillary: 238 mg/dL — ABNORMAL HIGH (ref 70–99)
Glucose-Capillary: 223 mg/dL — ABNORMAL HIGH (ref 70–99)

## 2019-12-30 MED ORDER — AMLODIPINE BESYLATE 5 MG PO TABS
5.0000 mg | ORAL_TABLET | Freq: Every day | ORAL | Status: DC
  Administered 2019-12-30 – 2019-12-31 (×2): 5 mg via ORAL
  Filled 2019-12-30 (×2): qty 1

## 2019-12-30 NOTE — Progress Notes (Signed)
Occupational Therapy Treatment Patient Details Name: [name] MRN: [id number] DOB: 03-Jul-1930 Today's Date: 12/30/2019    History of present illness 86 year old female with significant past medical history for spinal stenosis, chronic kidney disease stage IIIb, type 2 diabetes mellitus, dyslipidemia, history of breast cancer and osteoarthritis.Patient reported worsening left hip pain for the last 2 to 3 weeks, more severe over the last 4 days.  As an outpatient her pain has been refractive to medical therapy with nonsteroidal anti-inflammatory agents and local injection by orthopedics   OT comments  Patient continues to have significant difficulty with transfers, ambulation, ADLs secondary to left hip pain. Patient and therapist discussed need for improvement in functional abilities prior to returning home with spouse. Patient agreeable to short term rehab at discharge.   Follow Up Recommendations  SNF    Equipment Recommendations  3 in 1 bedside commode    Recommendations for Other Services      Precautions / Restrictions Precautions Precautions: Fall Restrictions Weight Bearing Restrictions: No       Mobility Bed Mobility Overal bed mobility: Needs Assistance Bed Mobility: Rolling;Sidelying to Sit Rolling: Min guard Sidelying to sit: Min assist       General bed mobility comments: multimodal cues for precautions for back, to roll, able to use rail, slide legs to bed edge and sit from sidelying with min assist.  Transfers Overall transfer level: Needs assistance Equipment used: Rolling walker (2 wheeled) Transfers: Sit to/from Stand Sit to Stand: Min assist         General transfer comment: Cues to stand from bed at RW, stood for pericare, pt unable to release RW with a single UE to wipe herself as she was unweighting LLE with BUEs,  small steps x 5 to recliner.    Balance Overall balance assessment: Needs assistance Sitting-balance support: Bilateral upper  extremity supported;Feet supported Sitting balance-Leahy Scale: Fair     Standing balance support: Bilateral upper extremity supported;During functional activity Standing balance-Leahy Scale: Poor Standing balance comment: reliant on UE's                           ADL either performed or assessed with clinical judgement   ADL                           Toilet Transfer: Minimal assistance;Cueing for safety;Cueing for sequencing;RW;Ambulation Toilet Transfer Details (indicate cue type and reason): a few small steps to recliner Toileting- Clothing Manipulation and Hygiene: Maximal assistance (max assist for toileting - needing assistance for clothing management and wiping - as patient reports she can not let go of walker.)         General ADL Comments: patient requiring increased assistance and time for mobility + self care due to pain, decreased activity tolerance     Vision       Perception     Praxis      Cognition Arousal/Alertness: Awake/alert Behavior During Therapy: WFL for tasks assessed/performed Overall Cognitive Status: Within Functional Limits for tasks assessed                                          Exercises     Shoulder Instructions       General Comments      Pertinent Vitals/ Pain  Pain Assessment: 0-10 Pain Score: 8  Pain Location: L buttock, near ischial tuberosity Pain Descriptors / Indicators: Burning;Discomfort;Radiating;Pressure Pain Intervention(s): Limited activity within patient's tolerance  Home Living                                          Prior Functioning/Environment              Frequency  Min 2X/week        Progress Toward Goals  OT Goals(current goals can now be found in the care plan section)  Progress towards OT goals: Progressing toward goals  Acute Rehab OT Goals Patient Stated Goal: to not have pain and walk OT Goal Formulation: With  patient/family Time For Goal Achievement: 01/11/20 Potential to Achieve Goals: Good  Plan Discharge plan needs to be updated    Co-evaluation    PT/OT/SLP Co-Evaluation/Treatment: Yes Reason for Co-Treatment: For patient/therapist safety;To address functional/ADL transfers PT goals addressed during session: Mobility/safety with mobility OT goals addressed during session: ADL's and self-care      AM-PAC OT "6 Clicks" Daily Activity     Outcome Measure   Help from another person eating meals?: None Help from another person taking care of personal grooming?: None Help from another person toileting, which includes using toliet, bedpan, or urinal?: A Lot Help from another person bathing (including washing, rinsing, drying)?: A Lot Help from another person to put on and taking off regular upper body clothing?: A Little Help from another person to put on and taking off regular lower body clothing?: A Lot 6 Click Score: 17    End of Session Equipment Utilized During Treatment: Rolling walker;Gait belt  OT Visit Diagnosis: Other abnormalities of gait and mobility (R26.89);Pain Pain - Right/Left: Left Pain - part of body: Leg;Hip   Activity Tolerance Patient limited by pain   Patient Left in chair;with call bell/phone within reach;with chair alarm set;with family/visitor present   Nurse Communication Mobility status        Time: 7680-8811 OT Time Calculation (min): 17 min  Charges: OT General Charges $OT Visit: 1 Visit  Derl Barrow, OTR/L [place]  Office [phone] Pager: [place] 12/30/2019, 2:21 PM

## 2019-12-30 NOTE — Progress Notes (Signed)
Physical Therapy Treatment Patient Details Name: [name] MRN: [id number] DOB: Mar 25, 1930 Today's Date: 12/30/2019    History of Present Illness 86 year old female with significant past medical history for spinal stenosis, chronic kidney disease stage IIIb, type 2 diabetes mellitus, dyslipidemia, history of breast cancer and osteoarthritis.Patient reported worsening left hip pain for the last 2 to 3 weeks, more severe over the last 4 days.  As an outpatient her pain has been refractive to medical therapy with nonsteroidal anti-inflammatory agents and local injection by orthopedics    PT Comments    Min assist for bed mobility, instructed pt in log roll to minimize strain to back. Min Assist for sit to stand and to pivot to recliner with RW. Pt reports 8/10 pain L ischial tuberosity area with activity. PT now recommending ST-SNF, pt and spouse agreeable.    Follow Up Recommendations  SNF     Equipment Recommendations  None recommended by PT    Recommendations for Other Services       Precautions / Restrictions Precautions Precautions: Fall Restrictions Weight Bearing Restrictions: No    Mobility  Bed Mobility Overal bed mobility: Needs Assistance Bed Mobility: Rolling;Sidelying to Sit Rolling: Min guard Sidelying to sit: Min assist       General bed mobility comments: multimodal cues for precautions for back, to roll, able to use rail, slide legs to bed edge and sit from sidelying with min assist.  Transfers Overall transfer level: Needs assistance Equipment used: Rolling walker (2 wheeled) Transfers: Sit to/from Stand Sit to Stand: Min assist         General transfer comment: Cues to stand from bed at RW, stood for pericare, pt unable to release RW with a single UE to wipe herself as she was unweighting LLE with BUEs,  small steps x 5 to recliner.  Ambulation/Gait Ambulation/Gait assistance: Min assist Gait Distance (Feet): 3 Feet Assistive device: Rolling  walker (2 wheeled) Gait Pattern/deviations: Step-to pattern Gait velocity: decr   General Gait Details: very slow to move, antalgic on LLE.   Stairs             Wheelchair Mobility    Modified Rankin (Stroke Patients Only)       Balance Overall balance assessment: Needs assistance Sitting-balance support: Bilateral upper extremity supported;Feet supported Sitting balance-Leahy Scale: Fair     Standing balance support: Bilateral upper extremity supported;During functional activity Standing balance-Leahy Scale: Poor Standing balance comment: reliant on UE's                            Cognition Arousal/Alertness: Awake/alert Behavior During Therapy: WFL for tasks assessed/performed Overall Cognitive Status: Within Functional Limits for tasks assessed                                        Exercises      General Comments        Pertinent Vitals/Pain Pain Score: 8  Pain Location: L buttock, near ischial tuberosity Pain Descriptors / Indicators: Burning;Discomfort;Radiating;Pressure Pain Intervention(s): Limited activity within patient's tolerance;Monitored during session;Premedicated before session;Repositioned    Home Living                      Prior Function            PT Goals (current goals can now be found in the  care plan section) Acute Rehab PT Goals Patient Stated Goal: to not have pain and walk PT Goal Formulation: With patient/family Time For Goal Achievement: 01/11/20 Potential to Achieve Goals: Good Progress towards PT goals: Progressing toward goals    Frequency    Min 3X/week      PT Plan Discharge plan needs to be updated    Co-evaluation              AM-PAC PT "6 Clicks" Mobility   Outcome Measure  Help needed turning from your back to your side while in a flat bed without using bedrails?: A Little Help needed moving from lying on your back to sitting on the side of a flat bed  without using bedrails?: A Little Help needed moving to and from a bed to a chair (including a wheelchair)?: A Little Help needed standing up from a chair using your arms (e.g., wheelchair or bedside chair)?: A Little Help needed to walk in hospital room?: A Lot Help needed climbing 3-5 steps with a railing? : Total 6 Click Score: 15    End of Session Equipment Utilized During Treatment: Gait belt Activity Tolerance: Patient tolerated treatment well Patient left: in chair;with call bell/phone within reach;with chair alarm set;with family/visitor present Nurse Communication: Mobility status PT Visit Diagnosis: Unsteadiness on feet (R26.81);Difficulty in walking, not elsewhere classified (R26.2);Pain Pain - Right/Left: Left Pain - part of body: Leg     Time: 1020-1038 PT Time Calculation (min) (ACUTE ONLY): 18 min  Charges:  $Therapeutic Activity: 8-22 mins                     Blondell Reveal Kistler PT 12/30/2019  Acute Rehabilitation Services Pager [phone] Office [phone]

## 2019-12-30 NOTE — TOC Initial Note (Signed)
Transition of Care [facility]) - Initial/Assessment Note    Patient Details  Name: [name] MRN: [id number] Date of Birth: 1930/03/19  [facility] of [facility] CM/SW Contact:    Lennart Pall, LCSW Phone Number: 12/30/2019, 2:09 PM  Clinical Narrative:                 Met with pt, spouse and daughter today to discuss d/c planning needs.  Therapies are recommending SNF for short term rehab and pt is agreeable.  Their preferred SNF is [facility].  Have begun bed search.  Hope to have something secured in the next day.  Continue to follow.  Expected Discharge Plan: Skilled Nursing Facility Barriers to Discharge: Continued Medical Work up   Patient Goals and CMS Choice Patient states their goals for this hospitalization and ongoing recovery are:: to eventually return home following rehab CMS Medicare.gov Compare Post Acute Care list provided to:: Patient Choice offered to / list presented to : Patient  Expected Discharge Plan and Services Expected Discharge Plan: [place] In-house Referral: Clinical Social Work   Post Acute Care Choice: [place] Living arrangements for the past 2 months: [place]                 DME Arranged: N/A DME Agency: NA       HH Arranged: NA [place] Agency: NA        Prior Living Arrangements/Services Living arrangements for the past 2 months: [place] Lives with:: Spouse Patient language and need for interpreter reviewed:: Yes Do you feel safe going back to the place where you live?: Yes      Need for Family Participation in Patient Care: Yes (Comment) Care giver support system in place?: Yes (comment)   Criminal Activity/Legal Involvement Pertinent to Current Situation/Hospitalization: No - Comment as needed  Activities of Daily Living Home Assistive Devices/Equipment: Eyeglasses ADL Screening (condition at time of admission) Patient's cognitive ability adequate to safely complete daily  activities?: Yes Is the patient deaf or have difficulty hearing?: Yes Does the patient have difficulty seeing, even when wearing glasses/contacts?: No Does the patient have difficulty concentrating, remembering, or making decisions?: No Patient able to express need for assistance with ADLs?: Yes Does the patient have difficulty dressing or bathing?: Yes Independently performs ADLs?: Yes (appropriate for developmental age) Does the patient have difficulty walking or climbing stairs?: Yes Weakness of Legs: None Weakness of Arms/Hands: None  Permission Sought/Granted Permission sought to share information with : Facility Art therapist granted to share information with : Yes, Verbal Permission Granted              Emotional Assessment Appearance:: Appears stated age Attitude/Demeanor/Rapport: Engaged, Gracious Affect (typically observed): Accepting Orientation: : Oriented to Place, Oriented to  Time, Oriented to Situation, Oriented to Self Alcohol / Substance Use: Not Applicable Psych Involvement: No (comment)  Admission diagnosis:  Hip pain [M25.559] Pain [R52] Radicular low back pain [M54.10] Intractable pain [R52] Osteoarthritis [M19.90] Patient Active Problem List   Diagnosis Date Noted  . Hip pain   . Severe back pain   . AKI (acute kidney injury) ([place])   . Intractable pain/ left hip osteoarthritis 12/25/2019  . Spinal stenosis, lumbar region, with neurogenic claudication 03/06/2016  . Malignant neoplasm of right female breast ([place]) 01/11/2016  . Controlled type 2 diabetes mellitus without complication, without long-term current use of insulin ([place]) 01/11/2016  . Genetic testing 08/18/2015  . Family history of breast  cancer   . Family history of prostate cancer   . Family history of pancreatic cancer   . Breast cancer of upper-outer quadrant of right female breast ([place]) 07/21/2015  . Insomnia 05/06/2015  . Medication management 12/16/2013  . Hand  arthritis 12/16/2013  . Rectal bleeding 12/16/2013  . Cold intolerance 06/17/2013  . Renal insufficiency 12/23/2012  . Post-menopause 12/23/2012  . Hyperlipidemia 12/23/2012  . Postmenopausal HRT (hormone replacement therapy) 12/23/2012  . History of shingles 12/23/2012  . Wears hearing aid 12/23/2012  . Nonspecific abnormal unspecified cardiovascular function study 02/28/2012  . Chronic sore throat 01/13/2012  . Hoarse 01/13/2012  . Chest pressure 12/03/2011  . Itching  mid back 12/03/2011  . Abnormal EKG 12/03/2011  . Medicare annual wellness visit, subsequent 12/03/2011  . DJD (degenerative joint disease) 12/03/2011  . DIABETES MELLITUS, TYPE II, CONTROLLED 11/15/2009  . VITAMIN D DEFICIENCY 11/15/2009  . EXOGENOUS OBESITY 11/15/2009  . ANXIETY DEPRESSION 12/22/2008  . HYPERTHYROIDISM 08/23/2008  . TOTAL KNEE REPLACEMENT, LEFT, HX OF 08/23/2008  . Hypothyroidism 10/08/2007  . ANEMIA 10/08/2007  . Osteoarthritis 10/08/2007  . OSTEOPENIA 10/08/2007  . Dyslipidemia 11/07/2006  . GERD 11/07/2006   PCP:  Burnis Medin, MD Pharmacy:   [facility] [place], [place] [address] Phone: [phone] Fax: [phone]  [facility] [place], [place] [address] [place] [address] Phone: [phone] Fax: [phone]     Social Determinants of Health (SDOH) Interventions    Readmission Risk Interventions No flowsheet data found.

## 2019-12-30 NOTE — Discharge Summary (Signed)
PROGRESS NOTE    [name]  YBW:[id number] DOB: 1929-11-17 DOA: 12/25/2019 PCP: Burnis Medin, MD   Brief Narrative:  86 year old female with significant past medical history for spinal stenosis, chronic kidney disease stage IIIb, type 2 diabetes mellitus, dyslipidemia, history of breast cancer and osteoarthritis.  Pain ongoing for several days not controlled with home over-the-counter medications.  Upon admission patient was hemodynamically stable.  Left hip showed complete loss of joint space started on pain medication, orthopedic consulted who recommended outpatient local steroid injection.  PT OT recommended SNF.    Assessment & Plan:   Principal Problem:   Intractable pain/ left hip osteoarthritis Active Problems:   Hypothyroidism   Dyslipidemia   ANXIETY DEPRESSION   GERD   Osteoarthritis   Hip pain   Severe back pain   AKI (acute kidney injury) ([place])   Intractable left hip pain due to osteoarthritis. Failed outpatient treatment with p.o. regimen.  Currently on oxycodone every 6 hours as needed.  MS Contin 15 mg every 12 hours, oxycodone IR 5 mg every 6 hours as needed.  Bowel regimen.  Prednisone 20 mg daily. Seen by orthopedic-recommends outpatient epidural steroid injection PT/OT-recommends SNF, arrangements to be made.  Acute metabolic and toxic encephalopathy.   Resolved   Uncontrolled T2DM(Hgb A1c 7,3)with dyslipidemia. On daily statin blood glucose well controlled.  CKD stage 3b/ hyponatremia. Baseline creatinine 1.3.  Peaked at 1.58 during the hospitalization improving with fluids. Status is: Inpatient  Essential hypertension, uncontrolled blood pressure -Norvasc 5 mg daily added.  Elevated blood pressure could also be related to her pain  Remains inpatient appropriate because: Uncontrolled pain with p.o. narcotics therefore being adjusted at this time  Dispo: The patient is from: Home  Anticipated d/c is to:  SNF   Anticipated d/c date is: 1 day  Patient currently is medically stable to be discharged to SNF when bed available.  Her pain medication adjustment will be an ongoing need as she continues to participate with physical therapy    DVT prophylaxis:      Enoxaparin   Code Status:              full  Family Communication:       Husband at bedside      Subjective: Feels okay this morning but she showed quite a bit of concern about her health care at home given her husband's advanced age she is concerned she does not have enough help at home.  Seen by physical therapy who recommended SNF which she is agreeable to.  No other complaints at the moment besides pain in her back  Review of Systems Otherwise negative except as per HPI, including: General: Denies fever, chills, night sweats or unintended weight loss. Resp: Denies cough, wheezing, shortness of breath. Cardiac: Denies chest pain, palpitations, orthopnea, paroxysmal nocturnal dyspnea. GI: Denies abdominal pain, nausea, vomiting, diarrhea or constipation GU: Denies dysuria, frequency, hesitancy or incontinence MS: Denies muscle aches, joint pain or swelling Neuro: Denies headache, neurologic deficits (focal weakness, numbness, tingling), abnormal gait Psych: Denies anxiety, depression, SI/HI/AVH Skin: Denies new rashes or lesions ID: Denies sick contacts, exotic exposures, travel  Examination:  Constitutional: Not in acute distress, elderly frail. Respiratory: Clear to auscultation bilaterally Cardiovascular: Normal sinus rhythm, no rubs Abdomen: Nontender nondistended good bowel sounds Musculoskeletal: Still limited range of motion of her left hip Skin: No rashes seen Neurologic: CN 2-12 grossly intact.  And nonfocal Psychiatric: Normal judgment and insight. Alert and oriented x 3. Normal  mood.  Objective: Vitals:   12/29/19 2215 12/30/19 0434 12/30/19 0908 12/30/19 1018  BP: (!) 166/87 (!) 141/78 (!) 176/83 (!)  157/80  Pulse: 93 82 90 87  Resp: 18 14    Temp: 98.4 F (36.9 C) 97.8 F (36.6 C)    TempSrc: Oral Oral    SpO2: 98% 95%    Weight:      Height:        Intake/Output Summary (Last 24 hours) at 12/30/2019 1215 Last data filed at 12/30/2019 0830 Gross per 24 hour  Intake 503.02 ml  Output --  Net 503.02 ml   Filed Weights   12/25/19 0922  Weight: 72.6 kg     Data Reviewed:   CBC: Recent Labs  Lab 12/25/19 1539 12/26/19 0603 12/27/19 0405  WBC 7.4 6.0 7.9  NEUTROABS 5.5  --   --   HGB 12.9 11.9* 12.0  HCT 39.0 36.3 36.0  MCV 94.2 94.3 91.6  PLT 211 201 902   Basic Metabolic Panel: Recent Labs  Lab 12/25/19 1539 12/26/19 0603 12/27/19 0405 12/28/19 0323 12/29/19 0337  NA 137 133* 132* 135 136  K 4.7 5.2* 4.7 4.9 4.4  CL 97* 98 96* 99 101  CO2 30 28 25 27 28   GLUCOSE 186* 216* 161* 166* 170*  BUN 39* 39* 49* 48* 43*  CREATININE 1.26* 1.20* 1.58* 1.47* 1.41*  CALCIUM 9.5 9.5 9.6 9.2 9.2   GFR: Estimated Creatinine Clearance: 25.6 mL/min (A) (by C-G formula based on SCr of 1.41 mg/dL (H)). Liver Function Tests: No results for input(s): AST, ALT, ALKPHOS, BILITOT, PROT, ALBUMIN in the last 168 hours. No results for input(s): LIPASE, AMYLASE in the last 168 hours. No results for input(s): AMMONIA in the last 168 hours. Coagulation Profile: Recent Labs  Lab 12/25/19 2114  INR 1.0   Cardiac Enzymes: No results for input(s): CKTOTAL, CKMB, CKMBINDEX, TROPONINI in the last 168 hours. BNP (last 3 results) No results for input(s): PROBNP in the last 8760 hours. HbA1C: No results for input(s): HGBA1C in the last 72 hours. CBG: Recent Labs  Lab 12/29/19 1212 12/29/19 1702 12/29/19 2202 12/30/19 0753 12/30/19 1203  GLUCAP 247* 209* 190* 156* 178*   Lipid Profile: No results for input(s): CHOL, HDL, LDLCALC, TRIG, CHOLHDL, LDLDIRECT in the last 72 hours. Thyroid Function Tests: No results for input(s): TSH, T4TOTAL, FREET4, T3FREE, THYROIDAB in the  last 72 hours. Anemia Panel: No results for input(s): VITAMINB12, FOLATE, FERRITIN, TIBC, IRON, RETICCTPCT in the last 72 hours. Sepsis Labs: No results for input(s): PROCALCITON, LATICACIDVEN in the last 168 hours.  Recent Results (from the past 240 hour(s))  SARS Coronavirus 2 by RT PCR (hospital order, performed in [facility] hospital lab) Nasopharyngeal Nasopharyngeal Swab     Status: None   Collection Time: 12/25/19  2:55 PM   Specimen: Nasopharyngeal Swab  Result Value Ref Range Status   SARS Coronavirus 2 NEGATIVE NEGATIVE Final    Comment: (NOTE) SARS-CoV-2 target nucleic acids are NOT DETECTED.  The SARS-CoV-2 RNA is generally detectable in upper and lower respiratory specimens during the acute phase of infection. The lowest concentration of SARS-CoV-2 viral copies this assay can detect is 250 copies / mL. A negative result does not preclude SARS-CoV-2 infection and should not be used as the sole basis for treatment or other patient management decisions.  A negative result may occur with improper specimen collection / handling, submission of specimen other than nasopharyngeal swab, presence of viral mutation(s) within the  areas targeted by this assay, and inadequate number of viral copies (<250 copies / mL). A negative result must be combined with clinical observations, patient history, and epidemiological information.  Fact Sheet for Patients:   StrictlyIdeas.no  Fact Sheet for Healthcare Providers: BankingDealers.co.za  This test is not yet approved or  cleared by the [place] FDA and has been authorized for detection and/or diagnosis of SARS-CoV-2 by FDA under an Emergency Use Authorization (EUA).  This EUA will remain in effect (meaning this test can be used) for the duration of the COVID-19 declaration under Section 564(b)(1) of the Act, 21 U.S.C. section 360bbb-3(b)(1), unless the authorization is terminated  or revoked sooner.  Performed at [facility], [place] [address], [place], [place] [address]          Radiology Studies: No results found.      Scheduled Meds: . acetaminophen  650 mg Oral Q6H  . amLODipine  5 mg Oral Daily  . aspirin EC  81 mg Oral Daily  . atorvastatin  10 mg Oral q1800  . bisacodyl  5 mg Oral Daily  . calcium-vitamin D  1 tablet Oral BID  . Chlorhexidine Gluconate Cloth  6 each Topical Daily  . cholecalciferol  2,000 Units Oral q morning - 10a  . diclofenac Sodium  2 g Topical QID  . enoxaparin (LOVENOX) injection  30 mg Subcutaneous Q24H  . insulin aspart  0-9 Units Subcutaneous TID WC  . morphine  15 mg Oral Q12H  . pantoprazole  40 mg Oral Daily  . predniSONE  20 mg Oral Q breakfast  . cyanocobalamin  2,000 mcg Oral Daily   Continuous Infusions: . lactated ringers 50 mL/hr at 12/30/19 0852     LOS: 4 days   Time spent= 35 mins    Ruthie Berch Arsenio Loader, MD [facility]  If 7PM-7AM, please contact night-coverage  12/30/2019, 12:15 PM

## 2019-12-30 NOTE — TOC Progression Note (Signed)
Transition of Care [facility]) - Progression Note    Patient Details  Name: [name] MRN: [id number] Date of Birth: 1929-07-27  Transition of Care [facility]) CM/SW Contact  Lennart Pall, LCSW Phone Number: 12/30/2019, 3:16 PM  Clinical Narrative:  Have received SNF bed offer from [facility] who can admit pt tomorrow.  Pt and daughter have accepted.  MD/ RN aware.     Expected Discharge Plan: [place] Barriers to Discharge: Continued Medical Work up  Expected Discharge Plan and Services Expected Discharge Plan: [place] In-house Referral: Clinical Social Work   Post Acute Care Choice: [place] Living arrangements for the past 2 months: Single Family Home                 DME Arranged: N/A DME Agency: NA       HH Arranged: NA HH Agency: NA         Social Determinants of Health (SDOH) Interventions    Readmission Risk Interventions No flowsheet data found.

## 2019-12-30 NOTE — NC FL2 (Signed)
[place] MEDICAID FL2 LEVEL OF CARE SCREENING TOOL     IDENTIFICATION  Patient Name: [name] Birthdate: [birth year] Sex: female Admission Date (Current Location): 12/25/2019  [facility] and [place] Number:  Herbalist and Address:  [facility],  [place], [place]      Provider Number: [id number]  Attending Physician Name and Address:  Damita Lack, MD  Relative Name and Phone Number:  spouse, Zlaty Alexa @ [phone]    Current Level of Care: Hospital Recommended Level of Care: [place] Prior Approval Number:    Date Approved/Denied:   PASRR Number: [id number]  Discharge Plan: SNF    Current Diagnoses: Patient Active Problem List   Diagnosis Date Noted  . Hip pain   . Severe back pain   . AKI (acute kidney injury) ([place])   . Intractable pain/ left hip osteoarthritis 12/25/2019  . Spinal stenosis, lumbar region, with neurogenic claudication 03/06/2016  . Malignant neoplasm of right female breast ([place]) 01/11/2016  . Controlled type 2 diabetes mellitus without complication, without long-term current use of insulin ([place]) 01/11/2016  . Genetic testing 08/18/2015  . Family history of breast cancer   . Family history of prostate cancer   . Family history of pancreatic cancer   . Breast cancer of upper-outer quadrant of right female breast ([place]) 07/21/2015  . Insomnia 05/06/2015  . Medication management 12/16/2013  . Hand arthritis 12/16/2013  . Rectal bleeding 12/16/2013  . Cold intolerance 06/17/2013  . Renal insufficiency 12/23/2012  . Post-menopause 12/23/2012  . Hyperlipidemia 12/23/2012  . Postmenopausal HRT (hormone replacement therapy) 12/23/2012  . History of shingles 12/23/2012  . Wears hearing aid 12/23/2012  . Nonspecific abnormal unspecified cardiovascular function study 02/28/2012  . Chronic sore throat 01/13/2012  . Hoarse 01/13/2012  . Chest pressure 12/03/2011  . Itching  mid  back 12/03/2011  . Abnormal EKG 12/03/2011  . Medicare annual wellness visit, subsequent 12/03/2011  . DJD (degenerative joint disease) 12/03/2011  . DIABETES MELLITUS, TYPE II, CONTROLLED 11/15/2009  . VITAMIN D DEFICIENCY 11/15/2009  . EXOGENOUS OBESITY 11/15/2009  . ANXIETY DEPRESSION 12/22/2008  . HYPERTHYROIDISM 08/23/2008  . TOTAL KNEE REPLACEMENT, LEFT, HX OF 08/23/2008  . Hypothyroidism 10/08/2007  . ANEMIA 10/08/2007  . Osteoarthritis 10/08/2007  . OSTEOPENIA 10/08/2007  . Dyslipidemia 11/07/2006  . GERD 11/07/2006    Orientation RESPIRATION BLADDER Height & Weight     Self, Time, Situation, Place  Normal Indwelling catheter Weight: 160 lb (72.6 kg) Height:  5\' 1"  (154.9 cm)  BEHAVIORAL SYMPTOMS/MOOD NEUROLOGICAL BOWEL NUTRITION STATUS      Continent    AMBULATORY STATUS COMMUNICATION OF NEEDS Skin   Limited Assist Verbally Normal                       Personal Care Assistance Level of Assistance  Bathing, Dressing Bathing Assistance: Limited assistance   Dressing Assistance: Limited assistance     Functional Limitations Info             SPECIAL CARE FACTORS FREQUENCY  PT (By licensed PT), OT (By licensed OT)     PT Frequency: 5x/wk OT Frequency: 5x/wk            Contractures Contractures Info: Not present    Additional Factors Info  Code Status, Allergies Code Status Info: Full Allergies Info: NKDA           Current Medications (12/30/2019):  This is the current hospital  active medication list Current Facility-Administered Medications  Medication Dose Route Frequency Provider Last Rate Last Admin  . acetaminophen (TYLENOL) tablet 650 mg  650 mg Oral Q6H Arrien, Jimmy Picket, MD   650 mg at 12/30/19 1234  . amLODipine (NORVASC) tablet 5 mg  5 mg Oral Daily Amin, Ankit Chirag, MD   5 mg at 12/30/19 0909  . aspirin EC tablet 81 mg  81 mg Oral Daily Malvin Johns, MD   81 mg at 12/30/19 0912  . atorvastatin (LIPITOR) tablet 10 mg  10  mg Oral q1800 Malvin Johns, MD   10 mg at 12/30/19 0911  . bisacodyl (DULCOLAX) EC tablet 5 mg  5 mg Oral Daily Malvin Johns, MD   5 mg at 12/30/19 0911  . bisacodyl (DULCOLAX) suppository 10 mg  10 mg Rectal Daily PRN Malvin Johns, MD      . calcium-vitamin D (OSCAL WITH D) 500-200 MG-UNIT per tablet 1 tablet  1 tablet Oral BID Malvin Johns, MD   1 tablet at 12/30/19 0910  . Chlorhexidine Gluconate Cloth 2 % PADS 6 each  6 each Topical Daily Tu, Ching T, DO   6 each at 12/30/19 0912  . cholecalciferol (VITAMIN D3) tablet 2,000 Units  2,000 Units Oral q morning - 10a Malvin Johns, MD   2,000 Units at 12/30/19 0911  . diclofenac Sodium (VOLTAREN) 1 % topical gel 2 g  2 g Topical QID Arrien, Jimmy Picket, MD   2 g at 12/30/19 1356  . enoxaparin (LOVENOX) injection 30 mg  30 mg Subcutaneous Q24H Malvin Johns, MD   30 mg at 12/29/19 2237  . insulin aspart (novoLOG) injection 0-9 Units  0-9 Units Subcutaneous TID WC Jackelyn Knife, MD   2 Units at 12/30/19 1234  . lactated ringers infusion   Intravenous Continuous Tawni Millers, MD 50 mL/hr at 12/30/19 0852 New Bag at 12/30/19 7824  . morphine (MS CONTIN) 12 hr tablet 15 mg  15 mg Oral Q12H Amin, Ankit Chirag, MD   15 mg at 12/30/19 0911  . ondansetron (ZOFRAN) injection 4 mg  4 mg Intravenous Q6H PRN Malvin Johns, MD   4 mg at 12/25/19 1707  . oxyCODONE (Oxy IR/ROXICODONE) immediate release tablet 5 mg  5 mg Oral Q4H PRN Arrien, Jimmy Picket, MD   5 mg at 12/30/19 1234  . pantoprazole (PROTONIX) EC tablet 40 mg  40 mg Oral Daily Malvin Johns, MD   40 mg at 12/30/19 0912  . polyethylene glycol (MIRALAX / GLYCOLAX) packet 17 g  17 g Oral Daily PRN Malvin Johns, MD   17 g at 12/29/19 2046  . predniSONE (DELTASONE) tablet 20 mg  20 mg Oral Q breakfast Arrien, Jimmy Picket, MD   20 mg at 12/30/19 0854  . vitamin B-12 (CYANOCOBALAMIN) tablet 2,000 mcg  2,000 mcg Oral Daily Malvin Johns, MD   2,000 mcg at 12/30/19  0909     Discharge Medications: Please see discharge summary for a list of discharge medications.  Relevant Imaging Results:  Relevant Lab Results:   Additional Information SS# [id number]  Lennart Pall, LCSW

## 2019-12-31 ENCOUNTER — Other Ambulatory Visit: Payer: Self-pay | Admitting: Family

## 2019-12-31 LAB — GLUCOSE, CAPILLARY
Glucose-Capillary: 163 mg/dL — ABNORMAL HIGH (ref 70–99)
Glucose-Capillary: 194 mg/dL — ABNORMAL HIGH (ref 70–99)

## 2019-12-31 LAB — SARS CORONAVIRUS 2 (TAT 6-24 HRS): SARS Coronavirus 2: NEGATIVE

## 2019-12-31 MED ORDER — SENNOSIDES-DOCUSATE SODIUM 8.6-50 MG PO TABS: 2.0000 | ORAL_TABLET | Freq: Two times a day (BID) | ORAL | Status: DC

## 2019-12-31 MED ORDER — OXYCODONE HCL 5 MG PO TABS: 5.0000 mg | ORAL_TABLET | Freq: Four times a day (QID) | ORAL | 0 refills | Status: DC | PRN

## 2019-12-31 MED ORDER — BISACODYL 5 MG PO TBEC: 5.0000 mg | DELAYED_RELEASE_TABLET | Freq: Every day | ORAL | 0 refills | Status: DC

## 2019-12-31 MED ORDER — BISACODYL 10 MG RE SUPP: 10.0000 mg | Freq: Every day | RECTAL | 0 refills | Status: DC | PRN

## 2019-12-31 MED ORDER — AMLODIPINE BESYLATE 5 MG PO TABS: 5.0000 mg | ORAL_TABLET | Freq: Every day | ORAL | Status: DC

## 2019-12-31 MED ORDER — SENNOSIDES-DOCUSATE SODIUM 8.6-50 MG PO TABS
2.0000 | ORAL_TABLET | Freq: Two times a day (BID) | ORAL | Status: DC
  Administered 2019-12-31: 2 via ORAL
  Filled 2019-12-31: qty 2

## 2019-12-31 MED ORDER — PREDNISONE 20 MG PO TABS: 20.0000 mg | ORAL_TABLET | Freq: Every day | ORAL | 0 refills | Status: AC

## 2019-12-31 MED ORDER — POLYETHYLENE GLYCOL 3350 17 G PO PACK: 17.0000 g | PACK | Freq: Every day | ORAL | 0 refills | Status: DC | PRN

## 2019-12-31 MED ORDER — MORPHINE SULFATE ER 15 MG PO TBCR: 15.0000 mg | EXTENDED_RELEASE_TABLET | Freq: Two times a day (BID) | ORAL | 0 refills | Status: DC

## 2019-12-31 NOTE — Progress Notes (Signed)
RN called report to [facility] facility. Discharge instructions and medication education provided to the pt and the pt's daughter at the bedside.

## 2019-12-31 NOTE — Discharge Summary (Signed)
Physician Discharge Summary  [name] HDQ:[id number] DOB: 20-Mar-1930 DOA: 12/25/2019  PCP: Burnis Medin, MD  Admit date: 12/25/2019 Discharge date: 12/31/2019  Admitted From: Home Disposition:  SNF  Recommendations for Outpatient Follow-up:  1. Follow up with PCP in 1-2 weeks 2. Please obtain BMP/CBC in one week your next doctors visit.  3. Pain Medicaltion with Bowel regimen. Needs to have atleast one soft bowel movement daily  4. Follow up with Ortho, Dr Maureen Ralphs for outpatient ESI 5. Norvasc 4m po daily started    Discharge Condition: Stable CODE STATUS: Full Diet recommendation: Diabetic  Brief/Interim Summary: 86year old female with significant past medical history for spinal stenosis, chronic kidney disease stage IIIb, type 2 diabetes mellitus, dyslipidemia, history of breast cancer and osteoarthritis.  Pain ongoing for several days not controlled with home over-the-counter medications.  Upon admission patient was hemodynamically stable.  Left hip showed complete loss of joint space started on pain medication, orthopedic consulted who recommended outpatient local steroid injection.  PT OT recommended SNF.    Assessment & Plan:   Principal Problem:   Intractable pain/ left hip osteoarthritis Active Problems:   Hypothyroidism   Dyslipidemia   ANXIETY DEPRESSION   GERD   Osteoarthritis   Hip pain   Severe back pain   AKI (acute kidney injury) ([facility]   Intractable left hip pain due to osteoarthritis. Failed outpatient treatment with p.o. regimen.  Currently on oxycodone every 6 hours as needed.  MS Contin 15 mg every 12 hours, oxycodone IR 5 mg every 6 hours as needed.  Bowel regimen.  Prednisone 20 mg daily. Seen by orthopedic-recommends outpatient epidural steroid injection. Dr AMaureen RalphsPT/OT-recommends SNF, arrangements to be made.  Acute metabolic and toxic encephalopathy.  Resolved   Uncontrolled T2DM(Hgb A1c 7,3)with dyslipidemia. On daily  statin blood glucose well controlled.  CKD stage 3b/ hyponatremia. Baseline creatinine 1.3.  Peaked at 1.58 during the hospitalization improving with fluids.  Essential hypertension, uncontrolled blood pressure -Improved. Norvasc 5 mg daily, increase to 129mif SBP remains elevated >160   Discharge Diagnoses:  Principal Problem:   Intractable pain/ left hip osteoarthritis Active Problems:   Hypothyroidism   Dyslipidemia   ANXIETY DEPRESSION   GERD   Osteoarthritis   Hip pain   Severe back pain   AKI (acute kidney injury) ([facility]   Consultations:  Ortho  Subjective: Feels ok, no new complaints  Discharge Exam: Vitals:   12/30/19 2155 12/31/19 0556  BP: (!) 161/79 (!) 144/78  Pulse: 90 83  Resp: 16 16  Temp: 98.1 F (36.7 C) (!) 97.5 F (36.4 C)  SpO2: 94% 94%   Vitals:   12/30/19 1018 12/30/19 1538 12/30/19 2155 12/31/19 0556  BP: (!) 157/80 (!) 141/69 (!) 161/79 (!) 144/78  Pulse: 87 99 90 83  Resp:   16 16  Temp:  97.9 F (36.6 C) 98.1 F (36.7 C) (!) 97.5 F (36.4 C)  TempSrc:  Oral Oral Oral  SpO2:  98% 94% 94%  Weight:      Height:        General: Pt is alert, awake, not in acute distress Cardiovascular: RRR, S1/S2 +, no rubs, no gallops Respiratory: CTA bilaterally, no wheezing, no rhonchi Abdominal: Soft, NT, ND, bowel sounds + Extremities: no edema, no cyanosis  Discharge Instructions   Allergies as of 12/31/2019   No Known Allergies     Medication List    STOP taking these medications   clobetasol cream 0.05 % Commonly known  as: TEMOVATE   traMADol 50 MG tablet Commonly known as: ULTRAM     TAKE these medications   Accu-Chek Aviva Plus w/Device Kit Check blood sugar TID-QID What changed:   how much to take  how to take this  when to take this   acetaminophen 500 MG tablet Commonly known as: TYLENOL Take 500 mg by mouth every 6 (six) hours as needed for mild pain.   amLODipine 5 MG tablet Commonly known as: NORVASC  Take 1 tablet (5 mg total) by mouth daily.   aspirin EC 81 MG tablet Take 81 mg by mouth daily.   atorvastatin 10 MG tablet Commonly known as: LIPITOR Take 1 tablet (10 mg total) by mouth daily.   bisacodyl 5 MG EC tablet Commonly known as: DULCOLAX Take 1 tablet (5 mg total) by mouth daily.   bisacodyl 10 MG suppository Commonly known as: DULCOLAX Place 1 suppository (10 mg total) rectally daily as needed for moderate constipation.   CALCIUM CITRATE +D PO Take 630 mg by mouth 2 (two) times daily. Vit d3 is 500iu   cyanocobalamin 2000 MCG tablet Take 2,000 mcg by mouth daily.   esomeprazole 40 MG capsule Commonly known as: NEXIUM TAKE 1 CAPSULE(40 MG) BY MOUTH DAILY What changed: See the new instructions.   furosemide 40 MG tablet Commonly known as: LASIX Take 1 tablet (40 mg total) by mouth daily. Or as directed   gabapentin 300 MG capsule Commonly known as: NEURONTIN TAKE 1 CAPSULE(300 MG) BY MOUTH AT BEDTIME What changed:   how much to take  how to take this  when to take this  additional instructions   glipiZIDE 5 MG 24 hr tablet Commonly known as: GLUCOTROL XL Take 10 mg ( 2- 5 mg) in morning  and 5 mg evening meal . What changed:   how much to take  how to take this  when to take this  additional instructions   morphine 15 MG 12 hr tablet Commonly known as: MS CONTIN Take 1 tablet (15 mg total) by mouth every 12 (twelve) hours.   OneTouch Verio test strip Generic drug: glucose blood 1 each by Other route as directed. What changed: Another medication with the same name was changed. Make sure you understand how and when to take each.   glucose blood test strip Commonly known as: Accu-Chek Aviva Plus Use TID-QID to check blood sugars. What changed:   how much to take  how to take this  when to take this   oxyCODONE 5 MG immediate release tablet Commonly known as: Oxy IR/ROXICODONE Take 1 tablet (5 mg total) by mouth every 6 (six)  hours as needed for up to 3 days for moderate pain or severe pain.   polyethylene glycol 17 g packet Commonly known as: MIRALAX / GLYCOLAX Take 17 g by mouth daily as needed for moderate constipation or severe constipation.   Potassium 99 MG Tabs Take 1 tablet by mouth every morning.   predniSONE 20 MG tablet Commonly known as: DELTASONE Take 1 tablet (20 mg total) by mouth daily with breakfast for 2 days.   senna-docusate 8.6-50 MG tablet Commonly known as: Senokot-S Take 2 tablets by mouth 2 (two) times daily.   vitamin C 500 MG tablet Commonly known as: ASCORBIC ACID Take 500 mg by mouth daily.   Vitamin D3 50 MCG (2000 UT) Tabs Take 1 tablet by mouth every morning.       Contact information for follow-up providers  Panosh, Standley Brooking, MD. Schedule an appointment as soon as possible for a visit in 1 week(s).   Specialties: Internal Medicine, Pediatrics Contact information: [place] [address] [phone]        Gaynelle Arabian, MD. Schedule an appointment as soon as possible for a visit in 4 day(s).   Specialty: Orthopedic Surgery Contact information: [address] [place] [address] [phone]            Contact information for after-discharge care    Destination    [facility] Preferred SNF .   Service: Skilled Nursing Contact information: [place] [phone]                 No Known Allergies  You were cared for by a hospitalist during your hospital stay. If you have any questions about your discharge medications or the care you received while you were in the hospital after you are discharged, you can call the unit and asked to speak with the hospitalist on call if the hospitalist that took care of you is not available. Once you are discharged, your primary care physician will handle any further medical issues. Please note that no refills  for any discharge medications will be authorized once you are discharged, as it is imperative that you return to your primary care physician (or establish a relationship with a primary care physician if you do not have one) for your aftercare needs so that they can reassess your need for medications and monitor your lab values.   Procedures/Studies: DG Lumbar Spine 2-3 Views  Result Date: 12/25/2019 CLINICAL DATA:  Pain EXAM: LUMBAR SPINE - 2-3 VIEW COMPARISON:  March 06, 2016 FINDINGS: 10 mm of anterolisthesis of L4 versus L5. 6 mm of anterolisthesis of L3 versus L4. These findings are similar in the interval. No acute fractures. Calcified atherosclerosis in the abdominal aorta. Lower lumbar facet degenerative changes. IMPRESSION: Malalignment, not significantly changed since February 27, 2016 as described above. No acute interval changes. Electronically Signed   By: Dorise Bullion III M.D   On: 12/25/2019 18:33   DG Si Joints  Result Date: 12/25/2019 CLINICAL DATA:  Pain EXAM: BILATERAL SACROILIAC JOINTS - 3+ VIEW COMPARISON:  None. FINDINGS: Mild degenerative changes in the SI joints. No bony erosions. No fractures. Severe degenerative changes and protrusio acetabuli seen on the left, further described on the x-ray of the left hip from today. IMPRESSION: Mild degenerative changes in the inferior SI joints. Severe degenerative changes in the left hip. Electronically Signed   By: Dorise Bullion III M.D   On: 12/25/2019 18:34   DG HIP UNILAT WITH PELVIS 2-3 VIEWS LEFT  Result Date: 12/25/2019 CLINICAL DATA:  Left hip pain. EXAM: DG HIP (WITH OR WITHOUT PELVIS) 2-3V LEFT COMPARISON:  None. FINDINGS: Severe degenerative changes in the left hip with complete loss of joint space and subchondral cysts in the femoral head and adjacent acetabulum. 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 No fracture. IMPRESSION: Severe degenerative changes in the left hip with complete loss of joint space, subchondral cysts, and  resulting protrusio acetabuli. Electronically Signed   By: Dorise Bullion III M.D   On: 12/25/2019 18:30      The results of significant diagnostics from this hospitalization (including imaging, microbiology, ancillary and laboratory) are listed below for reference.     Microbiology: Recent Results (from the past 240 hour(s))  SARS Coronavirus 2 by RT PCR (hospital  order, performed in [facility] hospital lab) Nasopharyngeal Nasopharyngeal Swab     Status: None   Collection Time: 12/25/19  2:55 PM   Specimen: Nasopharyngeal Swab  Result Value Ref Range Status   SARS Coronavirus 2 NEGATIVE NEGATIVE Final    Comment: (NOTE) SARS-CoV-2 target nucleic acids are NOT DETECTED.  The SARS-CoV-2 RNA is generally detectable in upper and lower respiratory specimens during the acute phase of infection. The lowest concentration of SARS-CoV-2 viral copies this assay can detect is 250 copies / mL. A negative result does not preclude SARS-CoV-2 infection and should not be used as the sole basis for treatment or other patient management decisions.  A negative result may occur with improper specimen collection / handling, submission of specimen other than nasopharyngeal swab, presence of viral mutation(s) within the areas targeted by this assay, and inadequate number of viral copies (<250 copies / mL). A negative result must be combined with clinical observations, patient history, and epidemiological information.  Fact Sheet for Patients:   StrictlyIdeas.no  Fact Sheet for Healthcare Providers: BankingDealers.co.za  This test is not yet approved or  cleared by the [place] FDA and has been authorized for detection and/or diagnosis of SARS-CoV-2 by FDA under an Emergency Use Authorization (EUA).  This EUA will remain in effect (meaning this test can be used) for the duration of the COVID-19 declaration under Section 564(b)(1) of the Act, 21  U.S.C. section 360bbb-3(b)(1), unless the authorization is terminated or revoked sooner.  Performed at [facility], [place] [address], [place], [place] [address]   SARS CORONAVIRUS 2 (TAT 6-24 HRS) Nasopharyngeal Nasopharyngeal Swab     Status: None   Collection Time: 12/30/19  5:12 PM   Specimen: Nasopharyngeal Swab  Result Value Ref Range Status   SARS Coronavirus 2 NEGATIVE NEGATIVE Final    Comment: (NOTE) SARS-CoV-2 target nucleic acids are NOT DETECTED.  The SARS-CoV-2 RNA is generally detectable in upper and lower respiratory specimens during the acute phase of infection. Negative results do not preclude SARS-CoV-2 infection, do not rule out co-infections with other pathogens, and should not be used as the sole basis for treatment or other patient management decisions. Negative results must be combined with clinical observations, patient history, and epidemiological information. The expected result is Negative.  Fact Sheet for Patients: SugarRoll.be  Fact Sheet for Healthcare Providers: https://www.woods-mathews.com/  This test is not yet approved or cleared by the [place] FDA and  has been authorized for detection and/or diagnosis of SARS-CoV-2 by FDA under an Emergency Use Authorization (EUA). This EUA will remain  in effect (meaning this test can be used) for the duration of the COVID-19 declaration under Se ction 564(b)(1) of the Act, 21 U.S.C. section 360bbb-3(b)(1), unless the authorization is terminated or revoked sooner.  Performed at [facility], [place] [address], [place], [place] [address]      Labs: BNP (last 3 results) No results for input(s): BNP in the last 8760 hours. Basic Metabolic Panel: Recent Labs  Lab 12/25/19 1539 12/26/19 0603 12/27/19 0405 12/28/19 0323 12/29/19 0337  NA 137 133* 132* 135 136  K 4.7 5.2* 4.7 4.9 4.4  CL 97* 98 96* 99 101  CO2 30 28 25 27 28    GLUCOSE 186* 216* 161* 166* 170*  BUN 39* 39* 49* 48* 43*  CREATININE 1.26* 1.20* 1.58* 1.47* 1.41*  CALCIUM 9.5 9.5 9.6 9.2 9.2   Liver Function Tests: No results for input(s): AST, ALT, ALKPHOS, BILITOT, PROT,  ALBUMIN in the last 168 hours. No results for input(s): LIPASE, AMYLASE in the last 168 hours. No results for input(s): AMMONIA in the last 168 hours. CBC: Recent Labs  Lab 12/25/19 1539 12/26/19 0603 12/27/19 0405  WBC 7.4 6.0 7.9  NEUTROABS 5.5  --   --   HGB 12.9 11.9* 12.0  HCT 39.0 36.3 36.0  MCV 94.2 94.3 91.6  PLT 211 201 194   Cardiac Enzymes: No results for input(s): CKTOTAL, CKMB, CKMBINDEX, TROPONINI in the last 168 hours. BNP: Invalid input(s): POCBNP CBG: Recent Labs  Lab 12/30/19 0753 12/30/19 1203 12/30/19 1621 12/30/19 2152 12/31/19 0750  GLUCAP 156* 178* 238* 223* 163*   D-Dimer No results for input(s): DDIMER in the last 72 hours. Hgb A1c No results for input(s): HGBA1C in the last 72 hours. Lipid Profile No results for input(s): CHOL, HDL, LDLCALC, TRIG, CHOLHDL, LDLDIRECT in the last 72 hours. Thyroid function studies No results for input(s): TSH, T4TOTAL, T3FREE, THYROIDAB in the last 72 hours.  Invalid input(s): FREET3 Anemia work up No results for input(s): VITAMINB12, FOLATE, FERRITIN, TIBC, IRON, RETICCTPCT in the last 72 hours. Urinalysis    Component Value Date/Time   COLORURINE YELLOW 03/24/2016 0906   APPEARANCEUR TURBID (A) 03/24/2016 0906   LABSPEC 1.008 03/24/2016 0906   PHURINE 7.0 03/24/2016 0906   GLUCOSEU NEGATIVE 03/24/2016 0906   HGBUR MODERATE (A) 03/24/2016 0906   HGBUR negative 11/15/2009 0000   BILIRUBINUR negative 02/26/2019 1040   KETONESUR NEGATIVE 03/24/2016 0906   PROTEINUR Positive (A) 02/26/2019 1040   PROTEINUR NEGATIVE 03/24/2016 0906   UROBILINOGEN 0.2 02/26/2019 1040   UROBILINOGEN 0.2 11/15/2009 0000   NITRITE positive 02/26/2019 1040   NITRITE POSITIVE (A) 03/24/2016 0906   LEUKOCYTESUR  Large (3+) (A) 02/26/2019 1040   Sepsis Labs Invalid input(s): PROCALCITONIN,  WBC,  LACTICIDVEN Microbiology Recent Results (from the past 240 hour(s))  SARS Coronavirus 2 by RT PCR (hospital order, performed in [place] hospital lab) Nasopharyngeal Nasopharyngeal Swab     Status: None   Collection Time: 12/25/19  2:55 PM   Specimen: Nasopharyngeal Swab  Result Value Ref Range Status   SARS Coronavirus 2 NEGATIVE NEGATIVE Final    Comment: (NOTE) SARS-CoV-2 target nucleic acids are NOT DETECTED.  The SARS-CoV-2 RNA is generally detectable in upper and lower respiratory specimens during the acute phase of infection. The lowest concentration of SARS-CoV-2 viral copies this assay can detect is 250 copies / mL. A negative result does not preclude SARS-CoV-2 infection and should not be used as the sole basis for treatment or other patient management decisions.  A negative result may occur with improper specimen collection / handling, submission of specimen other than nasopharyngeal swab, presence of viral mutation(s) within the areas targeted by this assay, and inadequate number of viral copies (<250 copies / mL). A negative result must be combined with clinical observations, patient history, and epidemiological information.  Fact Sheet for Patients:   StrictlyIdeas.no  Fact Sheet for Healthcare Providers: BankingDealers.co.za  This test is not yet approved or  cleared by the [place] FDA and has been authorized for detection and/or diagnosis of SARS-CoV-2 by FDA under an Emergency Use Authorization (EUA).  This EUA will remain in effect (meaning this test can be used) for the duration of the COVID-19 declaration under Section 564(b)(1) of the Act, 21 U.S.C. section 360bbb-3(b)(1), unless the authorization is terminated or revoked sooner.  Performed at [facility], [place] [address], [place], [place] [address]  SARS CORONAVIRUS 2 (TAT 6-24 HRS) Nasopharyngeal Nasopharyngeal Swab     Status: None   Collection Time: 12/30/19  5:12 PM   Specimen: Nasopharyngeal Swab  Result Value Ref Range Status   SARS Coronavirus 2 NEGATIVE NEGATIVE Final    Comment: (NOTE) SARS-CoV-2 target nucleic acids are NOT DETECTED.  The SARS-CoV-2 RNA is generally detectable in upper and lower respiratory specimens during the acute phase of infection. Negative results do not preclude SARS-CoV-2 infection, do not rule out co-infections with other pathogens, and should not be used as the sole basis for treatment or other patient management decisions. Negative results must be combined with clinical observations, patient history, and epidemiological information. The expected result is Negative.  Fact Sheet for Patients: SugarRoll.be  Fact Sheet for Healthcare Providers: https://www.woods-mathews.com/  This test is not yet approved or cleared by the [place] FDA and  has been authorized for detection and/or diagnosis of SARS-CoV-2 by FDA under an Emergency Use Authorization (EUA). This EUA will remain  in effect (meaning this test can be used) for the duration of the COVID-19 declaration under Se ction 564(b)(1) of the Act, 21 U.S.C. section 360bbb-3(b)(1), unless the authorization is terminated or revoked sooner.  Performed at [facility], [place] [address], [place], [place] [address]      Time coordinating discharge:  I have spent 35 minutes face to face with the patient and on the ward discussing the patients care, assessment, plan and disposition with other care givers. >50% of the time was devoted counseling the patient about the risks and benefits of treatment/Discharge disposition and coordinating care.   SIGNED:   Damita Lack, MD  [facility] 12/31/2019, 9:42 AM   If 7PM-7AM, please contact night-coverage

## 2020-01-03 ENCOUNTER — Non-Acute Institutional Stay (SKILLED_NURSING_FACILITY): Payer: Medicare Other | Admitting: Family

## 2020-01-03 ENCOUNTER — Encounter: Payer: Self-pay | Admitting: Family

## 2020-01-03 DIAGNOSIS — K219 Gastro-esophageal reflux disease without esophagitis: Secondary | ICD-10-CM

## 2020-01-03 DIAGNOSIS — R52 Pain, unspecified: Secondary | ICD-10-CM | POA: Diagnosis not present

## 2020-01-03 DIAGNOSIS — K5901 Slow transit constipation: Secondary | ICD-10-CM | POA: Diagnosis not present

## 2020-01-03 DIAGNOSIS — E785 Hyperlipidemia, unspecified: Secondary | ICD-10-CM

## 2020-01-03 DIAGNOSIS — R112 Nausea with vomiting, unspecified: Secondary | ICD-10-CM | POA: Diagnosis not present

## 2020-01-03 DIAGNOSIS — E119 Type 2 diabetes mellitus without complications: Secondary | ICD-10-CM

## 2020-01-03 DIAGNOSIS — I129 Hypertensive chronic kidney disease with stage 1 through stage 4 chronic kidney disease, or unspecified chronic kidney disease: Secondary | ICD-10-CM

## 2020-01-03 DIAGNOSIS — N183 Chronic kidney disease, stage 3 unspecified: Secondary | ICD-10-CM

## 2020-01-03 MED ORDER — ONDANSETRON HCL 4 MG PO TABS: 4.0000 mg | ORAL_TABLET | Freq: Three times a day (TID) | ORAL | 0 refills | Status: DC | PRN

## 2020-01-03 MED ORDER — OXYCODONE HCL 5 MG PO TABS: 5.0000 mg | ORAL_TABLET | Freq: Four times a day (QID) | ORAL | 0 refills | Status: AC | PRN

## 2020-01-03 NOTE — Progress Notes (Signed)
Provider:  Marlowe Sax, NP Location:    [place].   Nursing Home Room Number: 027-O Place of Service:  SNF (31)  PCP: Panosh, Standley Brooking, MD Patient Care Team: Burnis Medin, MD as PCP - General (Internal Medicine) Monna Fam, MD (Ophthalmology) Nicholas Lose, MD as Consulting Physician (Hematology and Oncology) Gery Pray, MD as Consulting Physician (Radiation Oncology) [name], MD as Consulting Physician (General Surgery) [name], MD as Consulting Physician (Urology) Delice Bison, Charlestine Massed, NP as Nurse Practitioner (Hematology and Oncology)  Extended Emergency Contact Information Primary Emergency Contact: Comer Locket E Address: [address]          [place], [place] [address] Johnnette Litter of [place] Phone: [phone] Relation: Spouse Secondary Emergency Contact: Sherian Rein of [place] Phone: [phone] Mobile Phone: [phone] Relation: Daughter  Code Status: FULL CODE Goals of Care: Advanced Directive information Advanced Directives 01/03/2020  Does Patient Have a Medical Advance Directive? No  Would patient like information on creating a medical advance directive? No - Patient declined      Chief Complaint  Patient presents with  . New Admit To SNF    Hospitalization Follow Up.    HPI: Patient is a 86 y.o. female seen today for follow up hospital admission 12/25/2019 - 12/31/2019 for worsening left hip pain for the past 2-3 weeks.she presented to the ED after she woke up in pain.she has been following up with Orthopedic treated with tramadol which helped minimally.Also had injection given by Dr. Sharol Given which helped some.In the ED her labs were unremarkable except chronic CR 1.29. left hip X-ray done 12/25/2019 showed severe degenerative changes with complete loss of joint space,sunchondral cysts and resulting protrusio acetabuli.Orthopedic was consulted.MRI recently done few months ago at  [facility]. Her pain was managed with Morphine,dilaudid  And Oxychodone.she had some nausea and vomiting thought possible from dilaudid.she was also treated with tapered Prednisone x 6 days.Orthopedic recommended outpatient follow up.PT/OT recommended discharge to SNF. She has a medical history of spinal stenosis,CKD stage 3 b,Type 2 DM,dyslipidemia,hx of breast cancer,Osteoarthritis among other conditions.she is seen in her room today sitting up on the wheelchair with facility Nurse present at bedside.she just had an episode of nausea and vomiting after taking her morning medication.She complains of constipation x 3 days.states therapy recommended prune juice which she would like to try.she describes her appetite as fair. Her main concerned is left hip pain which she rates 9/10 on scale. Facility Nurse just gave her scheduled dose of MS contin.she has completed her three days of Oxycodone 5 mg tablet one every 6 hrs.she will need this to be restarted due to her uncontrolled pain.   Type 2 DM - her oral medication was held during hospital admission.she was treated with sliding scale insulin.Facility log CBG log reading 150's.On glipizide 10 mg tablet daily.  Hypertension - blood pressure readings in the 120's/70's.on Amlodipine 5 mg tablet daily and furosemide 40 mg tablet daily.On ASA EC 81 mg tablet daily.Not on Potassium supplement.Has labs ordered for 01/04/2020. she denies any signs of hypotension.  GERD - on Nexium 40 mg capsule daily.denies any blood in the stool or emesis.Normocytic anemia noted during hospitalization. Awaiting CBC to be drawn 01/04/2020.   Hyperlipidemia - on atorvastatin 10 mg tablet daily.she denies any muscle aches or weakness.   Foley catheter in place.she reports no symptoms of urinary tract infection.Facility Nurse reports no increase confusion.     Past Medical History:  Diagnosis Date  .  Anemia   . Arthritis   . Bilateral edema of lower extremity   . 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 Chronic lower back pain   . CKD (chronic kidney disease) stage 3, GFR 30-59 ml/min   . DDD (degenerative disc disease)    lumbar  . Diverticulosis   . Family history of breast cancer   . Family history of pancreatic cancer   . Family history of prostate cancer   . Flaccid neuropathic bladder, not elsewhere classified   . Foley catheter in place   . GERD (gastroesophageal reflux disease)   . Hemorrhoids   . Hiatal hernia   . History of colon polyps   . History of radiation therapy 09-12-2015 to 10-12-2015   42.72 gray in 16 fractions directed right breast w/ boost of 12 gray in 6 fractions directed at the lumpectomy cavity- Total dose: 54.72y  . History of recurrent UTIs   . Hyperlipidemia   . PONV (postoperative nausea and vomiting)   . RBBB (right bundle branch block with left anterior fascicular block)   . Type 2 diabetes mellitus ([place])   . Urinary retention with incomplete bladder emptying   . Wears dentures    full upper and lower partial  . Wears glasses   . Wears hearing aid    bilateral but does not wear   Past Surgical History:  Procedure Laterality Date  . BREAST LUMPECTOMY WITH RADIOACTIVE SEED LOCALIZATION Right 08/03/2015   Procedure: BREAST LUMPECTOMY WITH RADIOACTIVE SEED LOCALIZATION;  Surgeon: [name], MD;  Location: [facility];  Service: General;  Laterality: Right;  . CARDIOVASCULAR STRESS TEST  01/14/2012   Low risk nuclear study w/ small mild apical and apical lateral reversible perfusion defect represents a small area of ischemia versus shifting breast artifact/  normal LV funciton and wall motion, ef 86%  . CARPAL TUNNEL RELEASE Right 1977  . CATARACT EXTRACTION W/ INTRAOCULAR LENS  IMPLANT,  BILATERAL Bilateral left 1996/  right 1997  . CLOSED RIGHT KNEE MANIPULATION  08/11/2002   post TKA  . CYSTOSTOMY N/A 05/17/2016   Procedure: CYSTOSCOPY WITH  SUPRAPUBIC PLACMENT;  Surgeon: [name], MD;  Location: [facility];  Service: Urology;  Laterality: N/A;  . GANGLION CYST EXCISION Right 12/09/2001   right palm  . KNEE ARTHROSCOPY Right 12/14/2002   w/ Lysis Adhesions  . LUMBAR LAMINECTOMY/DECOMPRESSION MICRODISCECTOMY N/A 03/06/2016   Procedure: CENTRAL DECOMPRESSION L3 - L4 ,L4 - L5;  Surgeon: [name], MD;  Location: [facility];  Service: Orthopedics;  Laterality: N/A;  . SHOULDER HEMI-ARTHROPLASTY Right 02/20/2009   avascular necrosis   . TOTAL KNEE ARTHROPLASTY Bilateral right 06-23-2002/  left 07-11-2008  . [place]  . VAGINAL HYSTERECTOMY  1975    reports that she has never smoked. She has never used smokeless tobacco. She reports that she does not drink alcohol and does not use drugs. Social History   Socioeconomic History  . Marital status: Married    Spouse name: Not on file  . Number of children: 2  . Years of education: Not on file  . Highest education level: Not on file  Occupational History  . Occupation: Retired  Tobacco Use  . Smoking status: Never Smoker  . Smokeless tobacco:  Never Used  Vaping Use  . Vaping Use: Never used  Substance and Sexual Activity  . Alcohol use: No  . Drug use: No  . Sexual activity: Never  Other Topics Concern  . Not on file  Social History Narrative   HH 2 married   Works for [employer] for 40 years retired   No pets    Lives w spouse; married in 80; 23 years    Social Determinants of Health   Financial Resource Strain:   . Difficulty of Paying Living Expenses:   Food Insecurity:   . Worried About Charity fundraiser in the Last Year:   . Arboriculturist in the Last Year:   Transportation Needs:   . Film/video editor (Medical):   Marland Kitchen Lack of Transportation  (Non-Medical):   Physical Activity:   . Days of Exercise per Week:   . Minutes of Exercise per Session:   Stress:   . Feeling of Stress :   Social Connections:   . Frequency of Communication with Friends and Family:   . Frequency of Social Gatherings with Friends and Family:   . Attends Religious Services:   . Active Member of Clubs or Organizations:   . Attends Archivist Meetings:   Marland Kitchen Marital Status:   Intimate Partner Violence:   . Fear of Current or Ex-Partner:   . Emotionally Abused:   Marland Kitchen Physically Abused:   . Sexually Abused:     Family History  Problem Relation Age of Onset  . Pancreatic cancer Mother 81  . Diabetes Mother   . Prostate cancer Brother   . Diabetes Father   . Heart disease Father   . Diabetes Brother   . Heart attack Brother   . Diabetes Sister   . Diabetes Daughter   . Diabetes Son   . Coronary artery disease Brother        MI in his 64s  . Colon cancer Neg Hx   . Stomach cancer Neg Hx     Health Maintenance  Topic Date Due  . COVID-19 Vaccine (1) Never done  . OPHTHALMOLOGY EXAM  08/04/2019  . DEXA SCAN  09/05/2028 (Originally 02/20/1997)  . INFLUENZA VACCINE  02/06/2020  . HEMOGLOBIN A1C  06/25/2020  . FOOT EXAM  08/30/2020  . URINE MICROALBUMIN  08/30/2020  . TETANUS/TDAP  01/12/2023  . PNA vac Low Risk Adult  Completed    No Known Allergies  Allergies as of 01/03/2020   No Known Allergies     Medication List       Accurate as of January 03, 2020 11:24 AM. If you have any questions, ask your nurse or doctor.        STOP taking these medications   Potassium 99 MG Tabs Stopped by: Nelda Bucks Zacary Bauer, NP   vitamin C 500 MG tablet Commonly known as: ASCORBIC ACID Stopped by: Sandrea Hughs, NP   Vitamin D3 50 MCG (2000 UT) Tabs Stopped by: Sandrea Hughs, NP     TAKE these medications   Accu-Chek Aviva Plus w/Device Kit Check blood sugar TID-QID   acetaminophen 500 MG tablet Commonly known as: TYLENOL Take 500  mg by mouth every 6 (six) hours as needed for mild pain.   amLODipine 5 MG tablet Commonly known as: NORVASC Take 1 tablet (5 mg total) by mouth daily.   aspirin EC 81 MG tablet Take 81 mg by mouth daily.   atorvastatin 10 MG tablet Commonly  known as: LIPITOR Take 1 tablet (10 mg total) by mouth daily.   bisacodyl 5 MG EC tablet Commonly known as: DULCOLAX Take 1 tablet (5 mg total) by mouth daily.   bisacodyl 10 MG suppository Commonly known as: DULCOLAX Place 1 suppository (10 mg total) rectally daily as needed for moderate constipation.   CALCIUM CITRATE +D PO Take 630 mg by mouth 2 (two) times daily. Vit d3 is 500iu   cyanocobalamin 2000 MCG tablet Take 2,000 mcg by mouth daily.   esomeprazole 40 MG capsule Commonly known as: NEXIUM TAKE 1 CAPSULE(40 MG) BY MOUTH DAILY   furosemide 40 MG tablet Commonly known as: LASIX Take 1 tablet (40 mg total) by mouth daily. Or as directed   gabapentin 300 MG capsule Commonly known as: NEURONTIN Take 300 mg by mouth at bedtime. What changed: Another medication with the same name was removed. Continue taking this medication, and follow the directions you see here. Changed by: Sandrea Hughs, NP   glipiZIDE 10 MG tablet Commonly known as: GLUCOTROL Take 10 mg by mouth in the morning. What changed: Another medication with the same name was removed. Continue taking this medication, and follow the directions you see here. Changed by: Sandrea Hughs, NP   glipiZIDE 5 MG 24 hr tablet Commonly known as: GLUCOTROL XL Take 5 mg by mouth every evening. What changed: Another medication with the same name was removed. Continue taking this medication, and follow the directions you see here. Changed by: Sandrea Hughs, NP   morphine 15 MG 12 hr tablet Commonly known as: MS CONTIN Take 1 tablet (15 mg total) by mouth every 12 (twelve) hours.   OneTouch Verio test strip Generic drug: glucose blood 1 each by Other route as  directed.   glucose blood test strip Commonly known as: Accu-Chek Aviva Plus Use TID-QID to check blood sugars.   oxyCODONE 5 MG immediate release tablet Commonly known as: Oxy IR/ROXICODONE Take 1 tablet (5 mg total) by mouth every 6 (six) hours as needed for up to 3 days for moderate pain or severe pain.   polyethylene glycol 17 g packet Commonly known as: MIRALAX / GLYCOLAX Take 17 g by mouth daily as needed for moderate constipation or severe constipation.   senna-docusate 8.6-50 MG tablet Commonly known as: Senokot-S Take 2 tablets by mouth 2 (two) times daily.       Review of Systems  Constitutional: Negative for appetite change, chills and fatigue.  HENT: Negative for congestion, rhinorrhea, sinus pressure, sinus pain, sneezing, sore throat and trouble swallowing.   Eyes: Negative for discharge, redness, itching and visual disturbance.  Respiratory: Negative for cough, chest tightness, shortness of breath and wheezing.   Cardiovascular: Negative for chest pain, palpitations and leg swelling.  Gastrointestinal: Positive for constipation. Negative for abdominal distention, abdominal pain, blood in stool, diarrhea, nausea and vomiting.  Endocrine: Negative for cold intolerance, heat intolerance, polydipsia, polyphagia and polyuria.  Genitourinary: Negative for dysuria, flank pain and urgency.       Has foley catheter   Musculoskeletal: Positive for arthralgias and gait problem. Negative for joint swelling and myalgias.  Skin: Negative for color change, pallor, rash and wound.  Neurological: Negative for dizziness, speech difficulty, weakness, light-headedness, numbness and headaches.  Hematological: Does not bruise/bleed easily.  Psychiatric/Behavioral: Negative for agitation, confusion and sleep disturbance. The patient is not nervous/anxious.     Vitals:   01/03/20 1041  BP: 125/76  Pulse: 91  Resp: 18  Temp: 98.2  F (36.8 C)  SpO2: 94%  Weight: 160 lb (72.6 kg)    Height: 5' 1"  (1.549 m)   Body mass index is 30.23 kg/m. Physical Exam Vitals reviewed.  Constitutional:      General: She is not in acute distress.    Appearance: She is not ill-appearing.  HENT:     Head: Normocephalic.     Mouth/Throat:     Mouth: Mucous membranes are moist.     Pharynx: Oropharynx is clear. No oropharyngeal exudate or posterior oropharyngeal erythema.  Eyes:     General: No scleral icterus.       Right eye: No discharge.        Left eye: No discharge.     Extraocular Movements: Extraocular movements intact.     Conjunctiva/sclera: Conjunctivae normal.     Pupils: Pupils are equal, round, and reactive to light.  Neck:     Vascular: No carotid bruit.  Cardiovascular:     Rate and Rhythm: Normal rate and regular rhythm.     Pulses: Normal pulses.     Heart sounds: Normal heart sounds. No murmur heard.  No friction rub. No gallop.   Pulmonary:     Effort: Pulmonary effort is normal. No respiratory distress.     Breath sounds: Normal breath sounds. No wheezing, rhonchi or rales.  Chest:     Chest wall: No tenderness.  Abdominal:     General: Bowel sounds are normal. There is no distension.     Palpations: Abdomen is soft. There is no mass.     Tenderness: There is no abdominal tenderness. There is no right CVA tenderness, left CVA tenderness, guarding or rebound.  Musculoskeletal:        General: No swelling or tenderness.     Cervical back: Normal range of motion. No rigidity or tenderness.     Right lower leg: No edema.     Left lower leg: No edema.     Comments: Unsteady gait on wheelchair during visit.Left lumbar-sacral tenderness with palpation.   Lymphadenopathy:     Cervical: No cervical adenopathy.  Skin:    General: Skin is warm and dry.     Coloration: Skin is not pale.     Findings: No bruising, erythema or rash.  Neurological:     Mental Status: She is alert.     Cranial Nerves: No cranial nerve deficit.     Sensory: No sensory  deficit.     Motor: No weakness.     Gait: Gait abnormal.  Psychiatric:        Mood and Affect: Mood normal.        Behavior: Behavior normal.        Thought Content: Thought content normal.        Judgment: Judgment normal.     Labs reviewed: Basic Metabolic Panel: Recent Labs    12/27/19 0405 12/28/19 0323 12/29/19 0337  NA 132* 135 136  K 4.7 4.9 4.4  CL 96* 99 101  CO2 25 27 28   GLUCOSE 161* 166* 170*  BUN 49* 48* 43*  CREATININE 1.58* 1.47* 1.41*  CALCIUM 9.6 9.2 9.2   Liver Function Tests: Recent Labs    08/31/19 1052  AST 13  ALT 11  ALKPHOS 88  BILITOT 0.5  PROT 6.7  ALBUMIN 4.1   CBC: Recent Labs    08/31/19 1052 08/31/19 1052 12/25/19 1539 12/26/19 0603 12/27/19 0405  WBC 3.6*   < > 7.4 6.0 7.9  NEUTROABS  1.7  --  5.5  --   --   HGB 11.8*   < > 12.9 11.9* 12.0  HCT 35.1*   < > 39.0 36.3 36.0  MCV 90.0   < > 94.2 94.3 91.6  PLT 190.0   < > 211 201 194   < > = values in this interval not displayed.   Cardiac Enzymes: No results for input(s): CKTOTAL, CKMB, CKMBINDEX, TROPONINI in the last 8760 hours. BNP: Invalid input(s): POCBNP Lab Results  Component Value Date   HGBA1C 7.3 (H) 12/25/2019   Lab Results  Component Value Date   TSH 3.25 08/28/2018   Lab Results  Component Value Date   VITAMINB12 >1500 (H) 08/20/2016   No results found for: FOLATE Lab Results  Component Value Date   IRON 66 08/20/2016   FERRITIN 40.6 08/20/2016    Imaging and Procedures obtained prior to SNF admission: DG Lumbar Spine 2-3 Views  Result Date: 12/25/2019 CLINICAL DATA:  Pain EXAM: LUMBAR SPINE - 2-3 VIEW COMPARISON:  March 06, 2016 FINDINGS: 10 mm of anterolisthesis of L4 versus L5. 6 mm of anterolisthesis of L3 versus L4. These findings are similar in the interval. No acute fractures. Calcified atherosclerosis in the abdominal aorta. Lower lumbar facet degenerative changes. IMPRESSION: Malalignment, not significantly changed since February 27, 2016  as described above. No acute interval changes. Electronically Signed   By: Dorise Bullion III M.D   On: 12/25/2019 18:33   DG Si Joints  Result Date: 12/25/2019 CLINICAL DATA:  Pain EXAM: BILATERAL SACROILIAC JOINTS - 3+ VIEW COMPARISON:  None. FINDINGS: Mild degenerative changes in the SI joints. No bony erosions. No fractures. Severe degenerative changes and protrusio acetabuli seen on the left, further described on the x-ray of the left hip from today. IMPRESSION: Mild degenerative changes in the inferior SI joints. Severe degenerative changes in the left hip. Electronically Signed   By: Dorise Bullion III M.D   On: 12/25/2019 18:34   DG HIP UNILAT WITH PELVIS 2-3 VIEWS LEFT  Result Date: 12/25/2019 CLINICAL DATA:  Left hip pain. EXAM: DG HIP (WITH OR WITHOUT PELVIS) 2-3V LEFT COMPARISON:  None. FINDINGS: Severe degenerative changes in the left hip with complete loss of joint space and subchondral cysts in the femoral head and adjacent acetabulum. 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 No fracture. IMPRESSION: Severe degenerative changes in the left hip with complete loss of joint space, subchondral cysts, and resulting protrusio acetabuli. Electronically Signed   By: Dorise Bullion III M.D   On: 12/25/2019 18:30    Assessment/Plan 1. Intractable pain/ left hip osteoarthritis Ongoing pain reason for recent hospitalization.[place] result showed severe degenerative changes with complete loss of joint space,sunchondral cysts and resulting protrusio acetabuli. - will restart her Oxycodone as below for better pain control. - continue on MS contin 15 mg 12 Hr tablet every 12 hrs and Gabapentin 300 mg capsule at bedtime. - Continue with PT/OT  - oxyCODONE (OXY IR/ROXICODONE) 5 MG immediate release tablet; Take 1 tablet (5 mg total) by mouth every 6 (six) hours as needed for up to 7 days for moderate pain or severe pain.  Dispense: 15 tablet; Refill: 0 - Follow up with Dr.Duda as directed.   2. Slow  transit constipation Reports no BM x 3 days.wants to try prune juice first. - will schedule her Miralax 17 gm Packet instead of as needed.Consider fleet enema if no results.  - encouraged fluid intake  3. Non-intractable vomiting with nausea, unspecified vomiting type Thought  from dilaudid during hospitalization was treated with Zofran and Phenergan. Will start on Zofran 4 mg tablet every 6 Hrs  as needed.  - ondansetron (ZOFRAN) 4 MG tablet; Take 1 tablet (4 mg total) by mouth every 8 (eight) hours as needed for nausea or vomiting.  Dispense: 20 tablet; Refill: 0  4. Controlled type 2 diabetes mellitus without complication, without long-term current use of insulin (HCC) Lab Results  Component Value Date   HGBA1C 7.3 (H) 12/25/2019  CBG under control.continue on On glipizide 10 mg tablet daily.on ASA and statin.   5. Gastroesophageal reflux disease without esophagitis Hgb stable. Continue on  Nexium 40 mg capsule daily. Orders in place for CBC in the morning 01/04/2020.   6. Hyperlipidemia, unspecified hyperlipidemia type No recent LDL for review.  Continue on atorvastatin 10 mg tablet daily.  Family/ staff Communication: plan of care reviewed with patient and facility Nurse verbalized understanding.   Labs/tests ordered: Has CBC,CMP scheduled for 01/04/2020.

## 2020-01-04 ENCOUNTER — Encounter: Payer: Self-pay | Admitting: Internal Medicine

## 2020-01-04 ENCOUNTER — Non-Acute Institutional Stay (SKILLED_NURSING_FACILITY): Payer: Medicare Other | Admitting: Internal Medicine

## 2020-01-04 DIAGNOSIS — R112 Nausea with vomiting, unspecified: Secondary | ICD-10-CM | POA: Diagnosis not present

## 2020-01-04 DIAGNOSIS — R52 Pain, unspecified: Secondary | ICD-10-CM | POA: Diagnosis not present

## 2020-01-04 DIAGNOSIS — K5901 Slow transit constipation: Secondary | ICD-10-CM

## 2020-01-04 DIAGNOSIS — N183 Chronic kidney disease, stage 3 unspecified: Secondary | ICD-10-CM

## 2020-01-04 DIAGNOSIS — K219 Gastro-esophageal reflux disease without esophagitis: Secondary | ICD-10-CM

## 2020-01-04 DIAGNOSIS — E119 Type 2 diabetes mellitus without complications: Secondary | ICD-10-CM

## 2020-01-04 DIAGNOSIS — E785 Hyperlipidemia, unspecified: Secondary | ICD-10-CM

## 2020-01-04 DIAGNOSIS — I129 Hypertensive chronic kidney disease with stage 1 through stage 4 chronic kidney disease, or unspecified chronic kidney disease: Secondary | ICD-10-CM

## 2020-01-04 LAB — COMPREHENSIVE METABOLIC PANEL
Calcium: 9.7 (ref 8.7–10.7)
GFR calc Af Amer: 43.4
GFR calc non Af Amer: 37.45

## 2020-01-04 LAB — CBC AND DIFFERENTIAL
HCT: 39 (ref 36–46)
Hemoglobin: 13.4 (ref 12.0–16.0)
Platelets: 232 (ref 150–399)
WBC: 8.9

## 2020-01-04 LAB — BASIC METABOLIC PANEL
BUN: 37 — AB (ref 4–21)
CO2: 26 — AB (ref 13–22)
Chloride: 90 — AB (ref 99–108)
Creatinine: 1.3 — AB (ref 0.5–1.1)
Glucose: 97
Potassium: 4.3 (ref 3.4–5.3)
Sodium: 131 — AB (ref 137–147)

## 2020-01-04 LAB — CBC: RBC: 4.34 (ref 3.87–5.11)

## 2020-01-04 NOTE — Progress Notes (Signed)
Provider:  Veleta Miners MD Location:   [facility] Room Number: [phone] Place of Service:  SNF (31)  PCP: Burnis Medin, MD Patient Care Team: Burnis Medin, MD as PCP - General (Internal Medicine) Monna Fam, MD (Ophthalmology) Nicholas Lose, MD as Consulting Physician (Hematology and Oncology) Gery Pray, MD as Consulting Physician (Radiation Oncology) [name], MD as Consulting Physician (General Surgery) [name], MD as Consulting Physician (Urology) Delice Bison, Charlestine Massed, NP as Nurse Practitioner (Hematology and Oncology)  Extended Emergency Contact Information Primary Emergency Contact: [name],[name] Address: [place], [place] [address] Johnnette Litter of [place] Phone: [phone] Mobile Phone: [phone] Relation: Daughter Secondary Emergency Contact: Elson Clan Address: [address]          [place], [place] [address] Johnnette Litter of [place] Phone: [phone] Relation: Spouse  Code Status: Full Code  Goals of Care: Advanced Directive information Advanced Directives 01/03/2020  Does Patient Have a Medical Advance Directive? No  Would patient like information on creating a medical advance directive? No - Patient declined      Chief Complaint  Patient presents with  . New Admit To SNF    Admission    HPI: Patient is a 86 y.o. female seen today for admission to SNF for Pain Control And Therapy Patient was admitted in the hospital from 6/19-6/25 for left hip pain.  Patient has history of spinal stenosis, chronic Foley catheter due to Neurogenic Bladder , CKD stage IIIb, type 2 diabetes, hyperlipidemia, history of breast cancer and osteoarthritis  Patient states that she has been having chronic left hip pain for past few months with worsening over the past few weeks.  She had an MRI done and  Injection and Tramadol  by [facility]  with minimum improvement.  She also  tried OTC nonsteroidal and Tylenol with no improvement X-ray done in the ED showed severe degenerative changes with complete loss of joint space.  She was admitted for pain control Was started on morphine , Oxycodone Prn  and prednisone for 6 days Patient states that her pain is now little better controlled but she still needing PRN oxycodone. Her main complaint today was constipation.  was started on MiraLAX yesterday but she thinks she is still cannot go.  Also is having some nausea.  Most likely related to her pain meds.  Denies any Vomiting or Abdominal Pain  Lives with her husband.  Was independent before this admission states she used to walk independently without any walker.Cognitively Intact One Daughter who lives in [place]  Past Medical History:  Diagnosis Date  . Anemia   . Arthritis   . Bilateral edema of lower extremity   . 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 Chronic lower back pain   . CKD (chronic kidney disease) stage 3, GFR 30-59 ml/min   . DDD (degenerative disc disease)    lumbar  . Diverticulosis   . Family history of breast cancer   . Family history of pancreatic cancer   . Family history of prostate cancer   . Flaccid neuropathic bladder, not elsewhere classified   . Foley catheter in place   . GERD (gastroesophageal reflux disease)   .  Hemorrhoids   . Hiatal hernia   . History of colon polyps   . History of radiation therapy 09-12-2015 to 10-12-2015   42.72 gray in 16 fractions directed right breast w/ boost of 12 gray in 6 fractions directed at the lumpectomy cavity- Total dose: 54.72y  . History of recurrent UTIs   . Hyperlipidemia   . PONV (postoperative nausea and vomiting)   . RBBB (right bundle branch block with left anterior  fascicular block)   . Type 2 diabetes mellitus ([place])   . Urinary retention with incomplete bladder emptying   . Wears dentures    full upper and lower partial  . Wears glasses   . Wears hearing aid    bilateral but does not wear   Past Surgical History:  Procedure Laterality Date  . BREAST LUMPECTOMY WITH RADIOACTIVE SEED LOCALIZATION Right 08/03/2015   Procedure: BREAST LUMPECTOMY WITH RADIOACTIVE SEED LOCALIZATION;  Surgeon: [name], MD;  Location: [facility];  Service: General;  Laterality: Right;  . CARDIOVASCULAR STRESS TEST  01/14/2012   Low risk nuclear study w/ small mild apical and apical lateral reversible perfusion defect represents a small area of ischemia versus shifting breast artifact/  normal LV funciton and wall motion, ef 86%  . CARPAL TUNNEL RELEASE Right 1977  . CATARACT EXTRACTION W/ INTRAOCULAR LENS  IMPLANT, BILATERAL Bilateral left 1996/  right 1997  . CLOSED RIGHT KNEE MANIPULATION  08/11/2002   post TKA  . CYSTOSTOMY N/A 05/17/2016   Procedure: CYSTOSCOPY WITH  SUPRAPUBIC PLACMENT;  Surgeon: [name], MD;  Location: [facility];  Service: Urology;  Laterality: N/A;  . GANGLION CYST EXCISION Right 12/09/2001   right palm  . KNEE ARTHROSCOPY Right 12/14/2002   w/ Lysis Adhesions  . LUMBAR LAMINECTOMY/DECOMPRESSION MICRODISCECTOMY N/A 03/06/2016   Procedure: CENTRAL DECOMPRESSION L3 - L4 ,L4 - L5;  Surgeon: [name], MD;  Location: [facility];  Service: Orthopedics;  Laterality: N/A;  . SHOULDER HEMI-ARTHROPLASTY Right 02/20/2009   avascular necrosis   . TOTAL KNEE ARTHROPLASTY Bilateral right 06-23-2002/  left 07-11-2008  . [place]  . VAGINAL HYSTERECTOMY  1975    reports that she has never smoked. She has never used smokeless tobacco. She reports that she does not drink alcohol and does not use drugs. Social History   Socioeconomic History  . Marital status: Married    Spouse name: Not on  file  . Number of children: 2  . Years of education: Not on file  . Highest education level: Not on file  Occupational History  . Occupation: Retired  Tobacco Use  . Smoking status: Never Smoker  . Smokeless tobacco: Never Used  Vaping Use  . Vaping Use: Never used  Substance and Sexual Activity  . Alcohol use: No  . Drug use: No  . Sexual activity: Never  Other Topics Concern  . Not on file  Social History Narrative   HH 2 married   Works for [employer] for 40 years retired   No pets    Lives w spouse; married in 8; 12 years    Social Determinants of Health   Financial Resource Strain:   . Difficulty of Paying Living Expenses:   Food Insecurity:   . Worried About Charity fundraiser in the Last Year:   . Arboriculturist in the Last Year:   Transportation Needs:   . Film/video editor (Medical):   Marland Kitchen Lack of Transportation (Non-Medical):  Physical Activity:   . Days of Exercise per Week:   . Minutes of Exercise per Session:   Stress:   . Feeling of Stress :   Social Connections:   . Frequency of Communication with Friends and Family:   . Frequency of Social Gatherings with Friends and Family:   . Attends Religious Services:   . Active Member of Clubs or Organizations:   . Attends Archivist Meetings:   Marland Kitchen Marital Status:   Intimate Partner Violence:   . Fear of Current or Ex-Partner:   . Emotionally Abused:   Marland Kitchen Physically Abused:   . Sexually Abused:     Functional Status Survey:    Family History  Problem Relation Age of Onset  . Pancreatic cancer Mother 7  . Diabetes Mother   . Prostate cancer Brother   . Diabetes Father   . Heart disease Father   . Diabetes Brother   . Heart attack Brother   . Diabetes Sister   . Diabetes Daughter   . Diabetes Son   . Coronary artery disease Brother        MI in his 70s  . Colon cancer Neg Hx   . Stomach cancer Neg Hx     Health Maintenance  Topic Date Due  . COVID-19 Vaccine (1) Never done    . OPHTHALMOLOGY EXAM  08/04/2019  . DEXA SCAN  09/05/2028 (Originally 02/20/1997)  . INFLUENZA VACCINE  02/06/2020  . HEMOGLOBIN A1C  06/25/2020  . FOOT EXAM  08/30/2020  . URINE MICROALBUMIN  08/30/2020  . TETANUS/TDAP  01/12/2023  . PNA vac Low Risk Adult  Completed    No Known Allergies  Allergies as of 01/04/2020   No Known Allergies     Medication List       Accurate as of January 04, 2020  9:46 AM. If you have any questions, ask your nurse or doctor.        Accu-Chek Aviva Plus w/Device Kit Check blood sugar TID-QID   acetaminophen 500 MG tablet Commonly known as: TYLENOL Take 500 mg by mouth every 6 (six) hours as needed for mild pain.   amLODipine 5 MG tablet Commonly known as: NORVASC Take 1 tablet (5 mg total) by mouth daily.   ascorbic acid 500 MG tablet Commonly known as: VITAMIN C Take 500 mg by mouth daily.   aspirin EC 81 MG tablet Take 81 mg by mouth daily.   atorvastatin 10 MG tablet Commonly known as: LIPITOR Take 1 tablet (10 mg total) by mouth daily.   bisacodyl 5 MG EC tablet Commonly known as: DULCOLAX Take 1 tablet (5 mg total) by mouth daily.   bisacodyl 10 MG suppository Commonly known as: DULCOLAX Place 1 suppository (10 mg total) rectally daily as needed for moderate constipation.   CALCIUM CITRATE +D PO Take 630 mg by mouth 2 (two) times daily. Vit d3 is 500iu   cyanocobalamin 2000 MCG tablet Take 2,000 mcg by mouth daily.   esomeprazole 40 MG capsule Commonly known as: NEXIUM TAKE 1 CAPSULE(40 MG) BY MOUTH DAILY   furosemide 40 MG tablet Commonly known as: LASIX Take 1 tablet (40 mg total) by mouth daily. Or as directed   gabapentin 300 MG capsule Commonly known as: NEURONTIN Take 300 mg by mouth at bedtime.   glipiZIDE 10 MG tablet Commonly known as: GLUCOTROL Take 10 mg by mouth in the morning.   glipiZIDE 5 MG 24 hr tablet Commonly known as: GLUCOTROL XL Take  5 mg by mouth every evening.   morphine 15 MG 12 hr  tablet Commonly known as: MS CONTIN Take 1 tablet (15 mg total) by mouth every 12 (twelve) hours.   ondansetron 4 MG tablet Commonly known as: Zofran Take 1 tablet (4 mg total) by mouth every 8 (eight) hours as needed for nausea or vomiting.   OneTouch Verio test strip Generic drug: glucose blood 1 each by Other route as directed.   glucose blood test strip Commonly known as: Accu-Chek Aviva Plus Use TID-QID to check blood sugars.   oxyCODONE 5 MG immediate release tablet Commonly known as: Oxy IR/ROXICODONE Take 1 tablet (5 mg total) by mouth every 6 (six) hours as needed for up to 7 days for moderate pain or severe pain.   polyethylene glycol 17 g packet Commonly known as: MIRALAX / GLYCOLAX Take 17 g by mouth daily as needed for moderate constipation or severe constipation.   Potassium 99 MG Tabs Take by mouth daily.   senna-docusate 8.6-50 MG tablet Commonly known as: Senokot-S Take 2 tablets by mouth 2 (two) times daily.   Vitamin D 50 MCG (2000 UT) tablet Take 2,000 Units by mouth daily.       Review of Systems  Constitutional: Negative.   HENT: Negative.   Respiratory: Negative.   Cardiovascular: Negative.   Gastrointestinal: Positive for constipation and nausea.  Genitourinary: Negative.   Musculoskeletal: Positive for arthralgias, back pain, gait problem and myalgias.  Skin: Negative.   Neurological: Positive for weakness.  Psychiatric/Behavioral: Negative.     Vitals:   01/04/20 0930  BP: 127/73  Pulse: 93  Resp: 17  Temp: (!) 97.3 F (36.3 C)  Weight: 160 lb (72.6 kg)  Height: 5' 1"  (1.549 m)   Body mass index is 30.23 kg/m. Physical Exam Vitals reviewed.  Constitutional:      Appearance: Normal appearance.  HENT:     Head: Normocephalic.     Nose: Nose normal.     Mouth/Throat:     Mouth: Mucous membranes are moist.     Pharynx: Oropharynx is clear.  Eyes:     Pupils: Pupils are equal, round, and reactive to light.  Cardiovascular:      Rate and Rhythm: Normal rate and regular rhythm.     Pulses: Normal pulses.     Heart sounds: Normal heart sounds.  Pulmonary:     Effort: Pulmonary effort is normal.     Breath sounds: Normal breath sounds.  Abdominal:     General: Abdomen is flat. Bowel sounds are normal. There is no distension.     Palpations: Abdomen is soft.     Tenderness: There is no abdominal tenderness.  Musculoskeletal:        General: No swelling.     Comments: Restriction motion in Left Hip due to Pain Right Hip Was normal  Skin:    General: Skin is warm and dry.  Neurological:     General: No focal deficit present.     Mental Status: She is alert and oriented to person, place, and time.  Psychiatric:        Mood and Affect: Mood normal.     Labs reviewed: Basic Metabolic Panel: Recent Labs    12/27/19 0405 12/28/19 0323 12/29/19 0337  NA 132* 135 136  K 4.7 4.9 4.4  CL 96* 99 101  CO2 25 27 28   GLUCOSE 161* 166* 170*  BUN 49* 48* 43*  CREATININE 1.58* 1.47* 1.41*  CALCIUM 9.6  9.2 9.2   Liver Function Tests: Recent Labs    08/31/19 1052  AST 13  ALT 11  ALKPHOS 88  BILITOT 0.5  PROT 6.7  ALBUMIN 4.1   No results for input(s): LIPASE, AMYLASE in the last 8760 hours. No results for input(s): AMMONIA in the last 8760 hours. CBC: Recent Labs    08/31/19 1052 08/31/19 1052 12/25/19 1539 12/26/19 0603 12/27/19 0405  WBC 3.6*   < > 7.4 6.0 7.9  NEUTROABS 1.7  --  5.5  --   --   HGB 11.8*   < > 12.9 11.9* 12.0  HCT 35.1*   < > 39.0 36.3 36.0  MCV 90.0   < > 94.2 94.3 91.6  PLT 190.0   < > 211 201 194   < > = values in this interval not displayed.   Cardiac Enzymes: No results for input(s): CKTOTAL, CKMB, CKMBINDEX, TROPONINI in the last 8760 hours. BNP: Invalid input(s): POCBNP Lab Results  Component Value Date   HGBA1C 7.3 (H) 12/25/2019   Lab Results  Component Value Date   TSH 3.25 08/28/2018   Lab Results  Component Value Date   VITAMINB12 >1500 (H)  08/20/2016   No results found for: FOLATE Lab Results  Component Value Date   IRON 66 08/20/2016   FERRITIN 40.6 08/20/2016    Imaging and Procedures obtained prior to SNF admission: DG Lumbar Spine 2-3 Views  Result Date: 12/25/2019 CLINICAL DATA:  Pain EXAM: LUMBAR SPINE - 2-3 VIEW COMPARISON:  March 06, 2016 FINDINGS: 10 mm of anterolisthesis of L4 versus L5. 6 mm of anterolisthesis of L3 versus L4. These findings are similar in the interval. No acute fractures. Calcified atherosclerosis in the abdominal aorta. Lower lumbar facet degenerative changes. IMPRESSION: Malalignment, not significantly changed since February 27, 2016 as described above. No acute interval changes. Electronically Signed   By: Dorise Bullion III M.D   On: 12/25/2019 18:33   DG Si Joints  Result Date: 12/25/2019 CLINICAL DATA:  Pain EXAM: BILATERAL SACROILIAC JOINTS - 3+ VIEW COMPARISON:  None. FINDINGS: Mild degenerative changes in the SI joints. No bony erosions. No fractures. Severe degenerative changes and protrusio acetabuli seen on the left, further described on the x-ray of the left hip from today. IMPRESSION: Mild degenerative changes in the inferior SI joints. Severe degenerative changes in the left hip. Electronically Signed   By: Dorise Bullion III M.D   On: 12/25/2019 18:34   DG HIP UNILAT WITH PELVIS 2-3 VIEWS LEFT  Result Date: 12/25/2019 CLINICAL DATA:  Left hip pain. EXAM: DG HIP (WITH OR WITHOUT PELVIS) 2-3V LEFT COMPARISON:  None. FINDINGS: Severe degenerative changes in the left hip with complete loss of joint space and subchondral cysts in the femoral head and adjacent acetabulum. 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 No fracture. IMPRESSION: Severe degenerative changes in the left hip with complete loss of joint space, subchondral cysts, and resulting protrusio acetabuli. Electronically Signed   By: Dorise Bullion III M.D   On: 12/25/2019 18:30    Assessment/Plan Intractable pain/ left hip  osteoarthritis Pain Seems Controlled on Morphine and PRN Oxycodone Prednisone for 5 days Will continue Same for now She has Appointment with Ortho  Slow transit constipation Just started on Miralax Will Check For impaction and Possible Enema Non-intractable vomiting with nausea, unspecified vomiting type Most Likely due to Pain Meds Abdominal Exam is Benign Just started on Zofran Spinal Stenosis On Neurontin Controlled type 2 diabetes mellitus   Continue Accue checks  A1C 7.3 Controlled on Glipizide LE edema On Lasix  Gastroesophageal reflux disease without esophagitis Nexium Hyperlipidemia,  On Lipitor Benign hypertension with CKD (chronic kidney disease) stage III Conitnue Norvasc Chronic foley for Neurogenic Bladder Needs Change every 6 weeks per Patient  CKD stage 3 Creat Stable Creat today 1.28 Labs back today All were in Normal Limits  Family/ staff Communication:   Labs/tests ordered: Total time spent in this patient care encounter was  45_  minutes; greater than 50% of the visit spent counseling patient and staff, reviewing records , Labs and coordinating care for problems addressed at this encounter.

## 2020-01-05 ENCOUNTER — Other Ambulatory Visit: Payer: Self-pay | Admitting: *Deleted

## 2020-01-05 NOTE — Patient Outreach (Signed)
Screened for potential [facility] Care Management needs as a benefit of  [employer].  [name] is currently receiving skilled therapy at [facility].  Writer attended telephonic interdisciplinary team meeting to assess for disposition needs and transition plan for resident.   Facility reports member's goal is to return home at [facility] dc. Pain and nausea has been a barrier with therapy. Has MD appointment today.   Will continue to follow for transition plans and potential [facility] Care Management needs. Will plan outreach as appropriate.   Marthenia Rolling, MSN-Ed, RN,BSN [place] Acute Care Coordinator [phone] [facility]) [phone]  (Toll free office)

## 2020-01-06 DIAGNOSIS — R41841 Cognitive communication deficit: Secondary | ICD-10-CM | POA: Diagnosis not present

## 2020-01-06 DIAGNOSIS — E039 Hypothyroidism, unspecified: Secondary | ICD-10-CM | POA: Diagnosis not present

## 2020-01-06 DIAGNOSIS — R52 Pain, unspecified: Secondary | ICD-10-CM | POA: Diagnosis not present

## 2020-01-06 DIAGNOSIS — E785 Hyperlipidemia, unspecified: Secondary | ICD-10-CM | POA: Diagnosis not present

## 2020-01-06 DIAGNOSIS — R2681 Unsteadiness on feet: Secondary | ICD-10-CM | POA: Diagnosis not present

## 2020-01-06 DIAGNOSIS — E871 Hypo-osmolality and hyponatremia: Secondary | ICD-10-CM | POA: Diagnosis not present

## 2020-01-06 DIAGNOSIS — R112 Nausea with vomiting, unspecified: Secondary | ICD-10-CM | POA: Diagnosis not present

## 2020-01-06 DIAGNOSIS — E119 Type 2 diabetes mellitus without complications: Secondary | ICD-10-CM | POA: Diagnosis not present

## 2020-01-06 DIAGNOSIS — R600 Localized edema: Secondary | ICD-10-CM | POA: Diagnosis not present

## 2020-01-06 DIAGNOSIS — K219 Gastro-esophageal reflux disease without esophagitis: Secondary | ICD-10-CM | POA: Diagnosis not present

## 2020-01-06 DIAGNOSIS — R2689 Other abnormalities of gait and mobility: Secondary | ICD-10-CM | POA: Diagnosis not present

## 2020-01-06 DIAGNOSIS — N179 Acute kidney failure, unspecified: Secondary | ICD-10-CM | POA: Diagnosis not present

## 2020-01-06 DIAGNOSIS — M1612 Unilateral primary osteoarthritis, left hip: Secondary | ICD-10-CM | POA: Diagnosis not present

## 2020-01-06 DIAGNOSIS — K5901 Slow transit constipation: Secondary | ICD-10-CM | POA: Diagnosis not present

## 2020-01-06 DIAGNOSIS — C50411 Malignant neoplasm of upper-outer quadrant of right female breast: Secondary | ICD-10-CM | POA: Diagnosis not present

## 2020-01-06 DIAGNOSIS — N312 Flaccid neuropathic bladder, not elsewhere classified: Secondary | ICD-10-CM | POA: Diagnosis not present

## 2020-01-06 DIAGNOSIS — D649 Anemia, unspecified: Secondary | ICD-10-CM | POA: Diagnosis not present

## 2020-01-06 DIAGNOSIS — N183 Chronic kidney disease, stage 3 unspecified: Secondary | ICD-10-CM | POA: Diagnosis not present

## 2020-01-06 DIAGNOSIS — M6281 Muscle weakness (generalized): Secondary | ICD-10-CM | POA: Diagnosis not present

## 2020-01-06 DIAGNOSIS — R339 Retention of urine, unspecified: Secondary | ICD-10-CM | POA: Diagnosis not present

## 2020-01-06 DIAGNOSIS — E559 Vitamin D deficiency, unspecified: Secondary | ICD-10-CM | POA: Diagnosis not present

## 2020-01-06 DIAGNOSIS — M48061 Spinal stenosis, lumbar region without neurogenic claudication: Secondary | ICD-10-CM | POA: Diagnosis not present

## 2020-01-06 DIAGNOSIS — I129 Hypertensive chronic kidney disease with stage 1 through stage 4 chronic kidney disease, or unspecified chronic kidney disease: Secondary | ICD-10-CM | POA: Diagnosis not present

## 2020-01-06 DIAGNOSIS — M25552 Pain in left hip: Secondary | ICD-10-CM | POA: Diagnosis not present

## 2020-01-06 DIAGNOSIS — M549 Dorsalgia, unspecified: Secondary | ICD-10-CM | POA: Diagnosis not present

## 2020-01-06 DIAGNOSIS — N1831 Chronic kidney disease, stage 3a: Secondary | ICD-10-CM | POA: Diagnosis not present

## 2020-01-06 DIAGNOSIS — M899 Disorder of bone, unspecified: Secondary | ICD-10-CM | POA: Diagnosis not present

## 2020-01-11 ENCOUNTER — Other Ambulatory Visit: Payer: Self-pay | Admitting: Internal Medicine

## 2020-01-11 DIAGNOSIS — R52 Pain, unspecified: Secondary | ICD-10-CM

## 2020-01-11 MED ORDER — MORPHINE SULFATE ER 15 MG PO TBCR: 15.0000 mg | EXTENDED_RELEASE_TABLET | Freq: Two times a day (BID) | ORAL | 0 refills | Status: DC

## 2020-01-11 MED ORDER — OXYCODONE HCL 5 MG PO TABS: 5.0000 mg | ORAL_TABLET | Freq: Two times a day (BID) | ORAL | 0 refills | Status: DC | PRN

## 2020-01-11 NOTE — Progress Notes (Signed)
Received refill requests for pt's scheduled morphine (renewed) and her prn oxycodone which was not in her chart.  She is down to using the prn oxycodone twice a day at this point, typically at 12 noon and 8pm per her nurse.  New E-Rx sent for 14 days.  If not used or using gradually less, d/c oxycodone when next due.    Bertel Venard L. Nahia Nissan, D.O. [facility] [address], [place] [address] Cell Phone (Mon-Fri 8am-5pm):  [phone] On Call:  [phone] & follow prompts after 5pm & weekends Office Phone:  [phone] Office Fax:  [phone]

## 2020-01-12 ENCOUNTER — Other Ambulatory Visit: Payer: Self-pay | Admitting: *Deleted

## 2020-01-12 NOTE — Patient Outreach (Signed)
Screened for potential [facility] Care Management needs as a benefit of  [employer].  [name] is currently receiving skilled therapy at [name].   Writer attended telephonic interdisciplinary team meeting to assess for disposition needs and transition plan for resident.   Facility reports member continues to have motion sickness of being dizzy and nauseated. Facility reports motion sickness is not new for member. States care plan meeting to be scheduled with family. From home with husband.   Will continue to follow for transition plans and for potential [name] Care Management needs.    Marthenia Rolling, MSN-Ed, RN,BSN [place] Acute Care Coordinator [phone] [facility]) [phone]  (Toll free office)

## 2020-01-19 ENCOUNTER — Other Ambulatory Visit: Payer: Self-pay | Admitting: Chiropractic Medicine

## 2020-01-19 DIAGNOSIS — M5416 Radiculopathy, lumbar region: Secondary | ICD-10-CM

## 2020-01-24 DIAGNOSIS — N312 Flaccid neuropathic bladder, not elsewhere classified: Secondary | ICD-10-CM | POA: Diagnosis not present

## 2020-01-25 ENCOUNTER — Ambulatory Visit
Admission: RE | Admit: 2020-01-25 | Discharge: 2020-01-25 | Disposition: A | Discharge status: Home / Self Care | Payer: Medicare Other | Source: Ambulatory Visit | Attending: Chiropractic Medicine | Admitting: Chiropractic Medicine

## 2020-01-25 ENCOUNTER — Other Ambulatory Visit: Payer: Self-pay

## 2020-01-25 DIAGNOSIS — M5416 Radiculopathy, lumbar region: Secondary | ICD-10-CM

## 2020-01-25 DIAGNOSIS — M48061 Spinal stenosis, lumbar region without neurogenic claudication: Secondary | ICD-10-CM | POA: Diagnosis not present

## 2020-01-25 MED ORDER — METHYLPREDNISOLONE ACETATE 40 MG/ML INJ SUSP (RADIOLOG
120.0000 mg | Freq: Once | INTRAMUSCULAR | Status: AC
  Administered 2020-01-25: 120 mg via EPIDURAL

## 2020-01-25 MED ORDER — IOPAMIDOL (ISOVUE-M 200) INJECTION 41%
1.0000 mL | Freq: Once | INTRAMUSCULAR | Status: AC
  Administered 2020-01-25: 1 mL via EPIDURAL

## 2020-01-25 NOTE — Discharge Instructions (Signed)

## 2020-01-26 ENCOUNTER — Other Ambulatory Visit: Payer: Self-pay | Admitting: Family

## 2020-01-26 DIAGNOSIS — R52 Pain, unspecified: Secondary | ICD-10-CM

## 2020-01-26 MED ORDER — MORPHINE SULFATE ER 15 MG PO TBCR: 15.0000 mg | EXTENDED_RELEASE_TABLET | Freq: Two times a day (BID) | ORAL | 0 refills | Status: DC

## 2020-01-31 ENCOUNTER — Other Ambulatory Visit: Payer: Self-pay | Admitting: *Deleted

## 2020-01-31 ENCOUNTER — Encounter: Payer: Self-pay | Admitting: Family

## 2020-01-31 ENCOUNTER — Non-Acute Institutional Stay (SKILLED_NURSING_FACILITY): Payer: Medicare Other | Admitting: Family

## 2020-01-31 DIAGNOSIS — K5901 Slow transit constipation: Secondary | ICD-10-CM

## 2020-01-31 DIAGNOSIS — R2681 Unsteadiness on feet: Secondary | ICD-10-CM | POA: Diagnosis not present

## 2020-01-31 DIAGNOSIS — E119 Type 2 diabetes mellitus without complications: Secondary | ICD-10-CM | POA: Diagnosis not present

## 2020-01-31 DIAGNOSIS — R600 Localized edema: Secondary | ICD-10-CM

## 2020-01-31 DIAGNOSIS — R112 Nausea with vomiting, unspecified: Secondary | ICD-10-CM

## 2020-01-31 DIAGNOSIS — E785 Hyperlipidemia, unspecified: Secondary | ICD-10-CM

## 2020-01-31 DIAGNOSIS — N183 Chronic kidney disease, stage 3 unspecified: Secondary | ICD-10-CM | POA: Diagnosis not present

## 2020-01-31 DIAGNOSIS — I129 Hypertensive chronic kidney disease with stage 1 through stage 4 chronic kidney disease, or unspecified chronic kidney disease: Secondary | ICD-10-CM | POA: Diagnosis not present

## 2020-01-31 DIAGNOSIS — R339 Retention of urine, unspecified: Secondary | ICD-10-CM | POA: Diagnosis not present

## 2020-01-31 DIAGNOSIS — R52 Pain, unspecified: Secondary | ICD-10-CM

## 2020-01-31 DIAGNOSIS — K219 Gastro-esophageal reflux disease without esophagitis: Secondary | ICD-10-CM | POA: Diagnosis not present

## 2020-01-31 MED ORDER — FUROSEMIDE 40 MG PO TABS: 40.0000 mg | ORAL_TABLET | Freq: Every day | ORAL | 0 refills | Status: DC

## 2020-01-31 MED ORDER — POTASSIUM 99 MG PO TABS: 99.0000 mg | ORAL_TABLET | Freq: Every day | ORAL | 0 refills | Status: DC

## 2020-01-31 MED ORDER — GABAPENTIN 300 MG PO CAPS: 300.0000 mg | ORAL_CAPSULE | Freq: Every day | ORAL | 0 refills | Status: DC

## 2020-01-31 MED ORDER — AMLODIPINE BESYLATE 5 MG PO TABS: 5.0000 mg | ORAL_TABLET | Freq: Every day | ORAL | Status: DC

## 2020-01-31 MED ORDER — ATORVASTATIN CALCIUM 10 MG PO TABS: 10.0000 mg | ORAL_TABLET | Freq: Every day | ORAL | 0 refills | Status: DC

## 2020-01-31 MED ORDER — ONDANSETRON HCL 4 MG PO TABS: 4.0000 mg | ORAL_TABLET | Freq: Three times a day (TID) | ORAL | 0 refills | Status: DC | PRN

## 2020-01-31 MED ORDER — SENNOSIDES-DOCUSATE SODIUM 8.6-50 MG PO TABS: 2.0000 | ORAL_TABLET | Freq: Two times a day (BID) | ORAL | Status: DC

## 2020-01-31 MED ORDER — ESOMEPRAZOLE MAGNESIUM 40 MG PO CPDR: DELAYED_RELEASE_CAPSULE | ORAL | 2 refills | Status: DC

## 2020-01-31 MED ORDER — GLIPIZIDE 10 MG PO TABS: 10.0000 mg | ORAL_TABLET | Freq: Every morning | ORAL | 0 refills | Status: DC

## 2020-01-31 MED ORDER — GLIPIZIDE ER 5 MG PO TB24: 5.0000 mg | ORAL_TABLET | Freq: Every evening | ORAL | 0 refills | Status: DC

## 2020-01-31 NOTE — Progress Notes (Signed)
Location:  [facility] Room Number: 511-P Place of Service:  SNF [phone])  Provider: Marlowe Sax FNP-C   PCP: Burnis Medin, MD Patient Care Team: Burnis Medin, MD as PCP - General (Internal Medicine) Monna Fam, MD (Ophthalmology) Nicholas Lose, MD as Consulting Physician (Hematology and Oncology) Gery Pray, MD as Consulting Physician (Radiation Oncology) [name], MD as Consulting Physician (General Surgery) [name], MD as Consulting Physician (Urology) Delice Bison, Charlestine Massed, NP as Nurse Practitioner (Hematology and Oncology) Valente David, RN as [place] Management  Extended Emergency Contact Information Primary Emergency Contact: [name],[name] Address: [place], [place] [address] Johnnette Litter of [place] Phone: [phone] Mobile Phone: [phone] Relation: Daughter Secondary Emergency Contact: Elson Clan Address: [address]          [place], [place] [address] Johnnette Litter of [place] Phone: [phone] Relation: Spouse  Code Status: Full Code  Goals of care:  Advanced Directive information Advanced Directives 01/31/2020  Does Patient Have a Medical Advance Directive? Yes  Does patient want to make changes to medical advance directive? No - Patient declined  Would patient like information on creating a medical advance directive? -     No Known Allergies  Chief Complaint  Patient presents with  . Discharge Note    Discharge from SNF    HPI:  86 y.o. female seen today at [facility] for discharge home.She has a medical history of Type 2 DM,Hyperlipidemia,spinal Stenosis,chronic Foley indwelling Foley catheter due to Neurogenic bladder,CKD stage 3 b,Hx of breast cancer,Osteoarthritis among other conditions.She was here for short term rehabilitation for post hospital admission from 12/25/2019 - 12/31/2019 for intractable pain left hip  osteoarthritis.Had X-ray done in the ED which showed severe degenerative changes with complete loss of joint space.she was admitted for pain control.she was started on Morphine,Oxycodone PRN and prednisone for 6 days.she was discharged here for Therapy and pain control.Her stay here in rehab was unremarkable.she was seen by Orthopedic specialist 01/25/2020 had cortisol injection with much improvement of pain she requested Morphine to be discontinued on 01/26/2020.Taking Tylenol as needed.She was also seen by Urologist for her suprapubic catheter.she walkers with Rolling walker and requires x 1 assist with her ADL's.Her CBG log reviewed readings range in the 130's -low 200's.she denies any signs of hypo/hyperglycemia.  She has worked well with PT/OT now stable for discharge home with spouse.She will be discharged home with [facility] PT/OT to continue with ROM, Exercise, Gait stability and muscle strengthening. She will require DME Rolling walker to allow her to maintain current level of independence with ADL's.She will also require Home Health Nurse for foley catheter care.she request Nurse to provide and demonstrated how to empty and care for foley catheter prior to discharge.Facility Nurse notified and orders written for foley catheter education.   Home health services will be arranged by facility social worker prior to discharge. Prescription medication will be written x 1 month then patient to follow up with PCP in 1-2 weeks.She denies any acute issues this visit. Facility staff report no new concerns.   Past Medical History:  Diagnosis Date  . Anemia   . Arthritis   . Bilateral edema of lower extremity   . 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 Chronic lower back pain   . CKD  (chronic kidney disease) stage 3, GFR 30-59 ml/min   . DDD (degenerative disc disease)    lumbar  . Diverticulosis   . Family history of breast cancer   . Family history of pancreatic cancer   . Family history of prostate cancer   . Flaccid neuropathic bladder, not elsewhere classified   . Foley catheter in place   . GERD (gastroesophageal reflux disease)   . Hemorrhoids   . Hiatal hernia   . History of colon polyps   . History of radiation therapy 09-12-2015 to 10-12-2015   42.72 gray in 16 fractions directed right breast w/ boost of 12 gray in 6 fractions directed at the lumpectomy cavity- Total dose: 54.72y  . History of recurrent UTIs   . Hyperlipidemia   . PONV (postoperative nausea and vomiting)   . RBBB (right bundle branch block with left anterior fascicular block)   . Type 2 diabetes mellitus ([place])   . Urinary retention with incomplete bladder emptying   . Wears dentures    full upper and lower partial  . Wears glasses   . Wears hearing aid    bilateral but does not wear    Past Surgical History:  Procedure Laterality Date  . BREAST LUMPECTOMY WITH RADIOACTIVE SEED LOCALIZATION Right 08/03/2015   Procedure: BREAST LUMPECTOMY WITH RADIOACTIVE SEED LOCALIZATION;  Surgeon: [name], MD;  Location: [facility];  Service: General;  Laterality: Right;  . CARDIOVASCULAR STRESS TEST  01/14/2012   Low risk nuclear study w/ small mild apical and apical lateral reversible perfusion defect represents a small area of ischemia versus shifting breast artifact/  normal LV funciton and wall motion, ef 86%  . CARPAL TUNNEL RELEASE Right 1977  . CATARACT EXTRACTION W/ INTRAOCULAR LENS  IMPLANT, BILATERAL Bilateral left 1996/  right 1997  . CLOSED RIGHT KNEE MANIPULATION  08/11/2002   post TKA  . CYSTOSTOMY N/A 05/17/2016   Procedure: CYSTOSCOPY WITH  SUPRAPUBIC PLACMENT;  Surgeon: [name], MD;  Location: [facility];  Service: Urology;   Laterality: N/A;  . GANGLION CYST EXCISION Right 12/09/2001   right palm  . KNEE ARTHROSCOPY Right 12/14/2002   w/ Lysis Adhesions  . LUMBAR LAMINECTOMY/DECOMPRESSION MICRODISCECTOMY N/A 03/06/2016   Procedure: CENTRAL DECOMPRESSION L3 - L4 ,L4 - L5;  Surgeon: [name], MD;  Location: [facility];  Service: Orthopedics;  Laterality: N/A;  . SHOULDER HEMI-ARTHROPLASTY Right 02/20/2009   avascular necrosis   . TOTAL KNEE ARTHROPLASTY Bilateral right 06-23-2002/  left 07-11-2008  . [place]  . VAGINAL HYSTERECTOMY  1975      reports that she has never smoked. She has never used smokeless tobacco. She reports that she does not drink alcohol and does not use drugs. Social History   Socioeconomic History  . Marital status: Married    Spouse name: Not on file  . Number of children: 2  . Years of education: Not on file  . Highest education level: Not on file  Occupational History  . Occupation: Retired  Tobacco Use  . Smoking status: Never Smoker  . Smokeless tobacco: Never Used  Vaping Use  . Vaping Use: Never used  Substance and Sexual Activity  . Alcohol use: No  . Drug use: No  . Sexual activity: Never  Other Topics Concern  . Not on file  Social History Narrative  HH 2 married   Works for [employer] for 40 years retired   No pets    Lives w spouse; married in 31; 64 years    Social Determinants of Health   Financial Resource Strain:   . Difficulty of Paying Living Expenses:   Food Insecurity:   . Worried About Charity fundraiser in the Last Year:   . Arboriculturist in the Last Year:   Transportation Needs:   . Film/video editor (Medical):   Marland Kitchen Lack of Transportation (Non-Medical):   Physical Activity:   . Days of Exercise per Week:   . Minutes of Exercise per Session:   Stress:   . Feeling of Stress :   Social Connections:   . Frequency of Communication with Friends and Family:   . Frequency of Social Gatherings with Friends and  Family:   . Attends Religious Services:   . Active Member of Clubs or Organizations:   . Attends Archivist Meetings:   Marland Kitchen Marital Status:   Intimate Partner Violence:   . Fear of Current or Ex-Partner:   . Emotionally Abused:   Marland Kitchen Physically Abused:   . Sexually Abused:    No Known Allergies  Pertinent  Health Maintenance Due  Topic Date Due  . OPHTHALMOLOGY EXAM  08/04/2019  . DEXA SCAN  09/05/2028 (Originally 02/20/1997)  . INFLUENZA VACCINE  02/06/2020  . HEMOGLOBIN A1C  06/25/2020  . FOOT EXAM  08/30/2020  . URINE MICROALBUMIN  08/30/2020  . PNA vac Low Risk Adult  Completed    Medications: Outpatient Encounter Medications as of 01/31/2020  Medication Sig  . acetaminophen (TYLENOL) 500 MG tablet Take 500 mg by mouth every 6 (six) hours as needed for mild pain.  Marland Kitchen amLODipine (NORVASC) 5 MG tablet Take 1 tablet (5 mg total) by mouth daily.  Marland Kitchen ascorbic acid (VITAMIN C) 500 MG tablet Take 500 mg by mouth daily.  Marland Kitchen aspirin EC 81 MG tablet Take 81 mg by mouth daily.  Marland Kitchen atorvastatin (LIPITOR) 10 MG tablet Take 1 tablet (10 mg total) by mouth daily.  . bisacodyl (DULCOLAX) 10 MG suppository Place 1 suppository (10 mg total) rectally daily as needed for moderate constipation.  . Calcium Carbonate-Vitamin D (CALCIUM 600/VITAMIN D PO) Take 1 capsule by mouth 2 (two) times daily.  . Cholecalciferol (VITAMIN D) 50 MCG (2000 UT) tablet Take 2,000 Units by mouth daily.  . cyanocobalamin 2000 MCG tablet Take 2,000 mcg by mouth daily.  Marland Kitchen esomeprazole (NEXIUM) 40 MG capsule TAKE 1 CAPSULE(40 MG) BY MOUTH DAILY  . furosemide (LASIX) 40 MG tablet Take 1 tablet (40 mg total) by mouth daily. Or as directed  . gabapentin (NEURONTIN) 300 MG capsule Take 300 mg by mouth at bedtime.  Marland Kitchen glipiZIDE (GLUCOTROL XL) 5 MG 24 hr tablet Take 5 mg by mouth every evening.  Marland Kitchen glipiZIDE (GLUCOTROL) 10 MG tablet Take 10 mg by mouth in the morning.  . Magnesium Hydroxide (DULCOLAX PO) Take 10 mg by mouth  every other day.  . ondansetron (ZOFRAN) 4 MG tablet Take 1 tablet (4 mg total) by mouth every 8 (eight) hours as needed for nausea or vomiting.  . polyethylene glycol (MIRALAX / GLYCOLAX) 17 g packet Take 17 g by mouth daily as needed for moderate constipation or severe constipation.  . Potassium 99 MG TABS Take by mouth daily.  Marland Kitchen senna-docusate (SENOKOT-S) 8.6-50 MG tablet Take 2 tablets by mouth 2 (two) times daily.  . [DISCONTINUED]  bisacodyl (DULCOLAX) 5 MG EC tablet Take 1 tablet (5 mg total) by mouth daily.  . [DISCONTINUED] Blood Glucose Monitoring Suppl (ACCU-CHEK AVIVA PLUS) w/Device KIT Check blood sugar TID-QID  . [DISCONTINUED] Calcium Citrate-Vitamin D (CALCIUM CITRATE +D PO) Take 630 mg by mouth 2 (two) times daily. Vit d3 is 500iu  . [DISCONTINUED] glucose blood (ACCU-CHEK AVIVA PLUS) test strip Use TID-QID to check blood sugars.  . [DISCONTINUED] morphine (MS CONTIN) 15 MG 12 hr tablet Take 1 tablet (15 mg total) by mouth every 12 (twelve) hours.  . [DISCONTINUED] ONETOUCH VERIO test strip 1 each by Other route as directed.   . [DISCONTINUED] oxyCODONE (OXY IR/ROXICODONE) 5 MG immediate release tablet Take 1 tablet (5 mg total) by mouth 2 (two) times daily as needed for severe pain (x 14 days).   No facility-administered encounter medications on file as of 01/31/2020.     Review of Systems  Constitutional: Negative for appetite change, chills, fatigue and fever.  HENT: Negative for congestion, postnasal drip, rhinorrhea, sinus pressure, sinus pain, sneezing, sore throat and trouble swallowing.   Eyes: Negative for pain, discharge, redness and itching.  Respiratory: Negative for cough, chest tightness, shortness of breath and wheezing.   Cardiovascular: Negative for chest pain, palpitations and leg swelling.  Gastrointestinal: Negative for abdominal distention, abdominal pain, constipation, diarrhea, nausea and vomiting.  Endocrine: Negative for cold intolerance, heat  intolerance, polydipsia, polyphagia and polyuria.  Genitourinary:       Foley catheter   Musculoskeletal: Positive for gait problem. Negative for joint swelling, myalgias and neck pain.       Left hip pain under control uses Tylenol as needed   Skin: Negative for color change, pallor and rash.  Neurological: Negative for dizziness, speech difficulty, weakness, light-headedness, numbness and headaches.  Hematological: Does not bruise/bleed easily.  Psychiatric/Behavioral: Negative for agitation, confusion and sleep disturbance. The patient is not nervous/anxious.     Vitals:   01/31/20 0939  BP: 111/66  Pulse: 89  Resp: 17  Temp: 98 F (36.7 C)  Weight: 153 lb 9.6 oz (69.7 kg)  Height: 5' 1"  (1.549 m)   Body mass index is 29.02 kg/m.   Physical Exam Vitals reviewed.  Constitutional:      General: She is not in acute distress.    Appearance: She is overweight. She is not ill-appearing.  HENT:     Head: Normocephalic.     Nose: Nose normal. No congestion or rhinorrhea.     Mouth/Throat:     Mouth: Mucous membranes are moist.     Pharynx: Oropharynx is clear. No oropharyngeal exudate or posterior oropharyngeal erythema.  Eyes:     General: No scleral icterus.       Right eye: No discharge.        Left eye: No discharge.     Conjunctiva/sclera: Conjunctivae normal.     Pupils: Pupils are equal, round, and reactive to light.  Cardiovascular:     Rate and Rhythm: Normal rate.     Pulses: Normal pulses.     Heart sounds: Normal heart sounds. No murmur heard.  No friction rub. No gallop.   Pulmonary:     Effort: Pulmonary effort is normal. No respiratory distress.     Breath sounds: Normal breath sounds. No wheezing, rhonchi or rales.  Chest:     Chest wall: No tenderness.  Abdominal:     General: Bowel sounds are normal. There is no distension.     Palpations: Abdomen is soft.  There is no mass.     Tenderness: There is no abdominal tenderness. There is no right CVA  tenderness, left CVA tenderness, guarding or rebound.  Musculoskeletal:        General: No swelling or tenderness.     Cervical back: Normal range of motion. No rigidity or tenderness.     Right lower leg: Edema present.     Left lower leg: Edema present.     Comments: Moves x 4 extremities walks with rolling walker.BLE 1-2+ edema   Lymphadenopathy:     Cervical: No cervical adenopathy.  Skin:    General: Skin is warm.     Coloration: Skin is not pale.     Findings: No bruising, erythema or rash.  Neurological:     Mental Status: She is alert and oriented to person, place, and time.     Cranial Nerves: No cranial nerve deficit.     Sensory: No sensory deficit.     Motor: No weakness.     Coordination: Coordination normal.     Gait: Gait abnormal.  Psychiatric:        Mood and Affect: Mood normal.        Behavior: Behavior normal.        Thought Content: Thought content normal.        Judgment: Judgment normal.     Labs reviewed: Basic Metabolic Panel: Recent Labs    12/27/19 0405 12/27/19 0405 12/28/19 0323 12/29/19 0337 01/04/20 0000  NA 132*   < > 135 136 131*  K 4.7   < > 4.9 4.4 4.3  CL 96*   < > 99 101 90*  CO2 25   < > 27 28 26*  GLUCOSE 161*  --  166* 170*  --   BUN 49*   < > 48* 43* 37*  CREATININE 1.58*   < > 1.47* 1.41* 1.3*  CALCIUM 9.6   < > 9.2 9.2 9.7   < > = values in this interval not displayed.   Liver Function Tests: Recent Labs    08/31/19 1052  AST 13  ALT 11  ALKPHOS 88  BILITOT 0.5  PROT 6.7  ALBUMIN 4.1   CBC: Recent Labs    08/31/19 1052 08/31/19 1052 12/25/19 1539 12/25/19 1539 12/26/19 0603 12/27/19 0405 01/04/20 0000  WBC 3.6*   < > 7.4   < > 6.0 7.9 8.9  NEUTROABS 1.7  --  5.5  --   --   --   --   HGB 11.8*   < > 12.9   < > 11.9* 12.0 13.4  HCT 35.1*   < > 39.0   < > 36.3 36.0 39  MCV 90.0   < > 94.2  --  94.3 91.6  --   PLT 190.0   < > 211   < > 201 194 232   < > = values in this interval not displayed.    CBG: Recent Labs    12/30/19 2152 12/31/19 0750 12/31/19 1151  GLUCAP 223* 163* 194*    Procedures and Imaging Studies During Stay: DG INJECT DIAG/THERA/INC NEEDLE/CATH/PLC EPI/LUMB/SAC W/IMG  Result Date: 01/25/2020 CLINICAL DATA:  Spondylosis without myelopathy. Stenosis L3-4 and L4-5. Left L5-S1 Barnabas Lister shin requested. FLUOROSCOPY TIME:  0 minutes 27 seconds. 31.13 micro gray meter squared PROCEDURE: The procedure, risks, benefits, and alternatives were explained to the patient. Questions regarding the procedure were encouraged and answered. The patient understands and consents to the procedure. LUMBAR EPIDURAL  INJECTION: An interlaminar approach was performed on the left at L5-S1. The overlying skin was cleansed and anesthetized. A 20 gauge epidural needle was advanced using loss-of-resistance technique. DIAGNOSTIC EPIDURAL INJECTION: Injection of Isovue-M 200 shows a good epidural pattern with spread above and below the level of needle placement, primarily on the left, but to both sides. No vascular opacification is seen. THERAPEUTIC EPIDURAL INJECTION: One hundred twenty mg of Depo-Medrol mixed with 2.5 cc 1% lidocaine were instilled. The procedure was well-tolerated, and the patient was discharged thirty minutes following the injection in good condition. COMPLICATIONS: None IMPRESSION: Technically successful epidural injection on the left at L5-S1 # 1. Electronically Signed   By: Nelson Chimes M.D.   On: 01/25/2020 09:40    Assessment/Plan:     1. Unsteady gait Has worked well with PT/ OT. She will discharge home [facility]  PT/OT to continue with ROM, Exercise, Gait stability and muscle strengthening.She will require DME Rolling walker to allow her to maintain current level of independence with ADL's. Fall and safety precautions.  2. Controlled type 2 diabetes mellitus without complication, without long-term current use of insulin (HCC) No Hgb A1c on chart for review.will defer  to PCP  CBG in the 130's -low 200's. - control glipizide 10 mg tablet in the morning and Glipizide 5 mg tablet in the evening. - continue on ASA and Statin for cardiovascular event prevention. - glipiZIDE (GLUCOTROL XL) 5 MG 24 hr tablet; Take 1 tablet (5 mg total) by mouth every evening.  Dispense: 30 tablet; Refill: 0 - glipiZIDE (GLUCOTROL) 10 MG tablet; Take 1 tablet (10 mg total) by mouth in the morning.  Dispense: 30 tablet; Refill: 0  3. Gastroesophageal reflux disease without esophagitis Symptoms control on Nexium.continue to avoid food that aggravating symptoms.  - esomeprazole (NEXIUM) 40 MG capsule; TAKE 1 CAPSULE(40 MG) BY MOUTH DAILY  Dispense: 30 capsule; Refill: 2  4. Hyperlipidemia, unspecified hyperlipidemia type No Latest LDL for review on chart.will defer to PCP  - continue on Atorvastatin.  - atorvastatin (LIPITOR) 10 MG tablet; Take 1 tablet (10 mg total) by mouth daily.  Dispense: 30 tablet; Refill: 0  5. Benign hypertension with CKD (chronic kidney disease) stage III B/p at goal.continue on Amlodipine and Furosemide.on ASA and Statin. - amLODipine (NORVASC) 5 MG tablet; Take 1 tablet (5 mg total) by mouth daily. - CBC, BMP in 1-2 weeks PCP   6. Slow transit constipation Current regimen effective.encouraged oral intake and hydration.continue on Senokot-S and Miralax as needed  - senna-docusate (SENOKOT-S) 8.6-50 MG tablet; Take 2 tablets by mouth 2 (two) times daily.  7. Intractable pain Status post hospital admission for left hip pain as above.Pain improved on Morphine 15 mg every 12 hrs and Oxycodone 5 mg twice daily as needed.she received cortisol injection with Orthopedic 01/25/2020 and requested narco discontinue due to none needed. Narcotic D/Ced 01/26/2020. - Continue on tylenol as needed continued.  - continue to follow up with Orthopedic as directed.    8. Non-intractable vomiting with nausea, unspecified vomiting type Nausea has improved.continue on Zofran  as needed.  - ondansetron (ZOFRAN) 4 MG tablet; Take 1 tablet (4 mg total) by mouth every 8 (eight) hours as needed for nausea or vomiting.  Dispense: 20 tablet; Refill: 0  9. Urinary retention with incomplete bladder emptying - Home health Nurse for foley care  - continue to follow up with Urologist   10. Bilateral leg edema - No abrupt weight gain or signs of fluid  overload. Continue on furosemide along with Potassium supplement. - continue to monitor weight - furosemide (LASIX) 40 MG tablet; Take 1 tablet (40 mg total) by mouth daily. Or as directed  Dispense: 30 tablet; Refill: 0 - Potassium 99 MG TABS; Take 1 tablet (99 mg total) by mouth daily.  Dispense: 30 tablet; Refill: 0 - BMP in 1-2 weeks PCP   Patient is being discharged with the following home health services:   -PT/OT for ROM, exercise, gait stability and muscle strengthening  -  HH RN for foley cathter care   Patient is being discharged with the following durable medical equipment:    Rolling walker to allow her to maintain current level of independence.  Patient has been advised to f/u with their PCP in 1-2 weeks to for a transitions of care visit.Social services at their facility was responsible for arranging this appointment.  Pt was provided with adequate prescriptions of non controlled medications to reach the scheduled appointment.Has no controlled substances to prescribed narcotic discontinued 01/26/2020 not requiring med per patient.   Future labs/tests needed:  CBC, BMP in 1-2 weeks PCP

## 2020-01-31 NOTE — Patient Outreach (Signed)
[facility] Post- Acute Care Coordinator follow up.  Member screened for potential [facility] Care Management needs as a benefit of [place] [employer].  [name] is receiving skilled therapy at [facility]. Spoke with [place] department. Writer informed member will transition to home on tomorrow 02/01/20 with [facility]. States member's daughter has been primary contact.  Telephone call made to spouse/DPR Mr. Moger [phone]. No answer. HIPAA compliant voicemail message left to request return call. Telephone call made to daughter [name] [phone]. Patient identifiers confirmed.   Received return call from Festus Barren (daughter). [name] confirms member will return home on tomorrow 02/01/20. States Mrs. Wurster lives with husband. [name] (daughter) assists with transportation to MD appointments.   Explained [facility] Management services. [name] states she will make Mrs. Leser aware of [facility] follow up. Explained [facility] Care Management services will not interfere or replace services provided by home health. [name] states she can be called in case Mrs. Ferreras cannot be reached.   Will send [facility] Management and writer contact information via email.   Mrs. Philemon has medical history DM, CKD stage 3, OA, HLD, breast cancer, and dizziness, HTN.   Discussed [facility] follow up. [name] states she wants to hold off on [facility] referral at this time.  Will make referral to [facility] for care coordination. Mrs. Seubert is slated for transition to home from [facility] on 02/01/20.   Marthenia Rolling, MSN-Ed, RN,BSN [place] Acute Care Coordinator [phone] [facility]) [phone]  (Toll free office)

## 2020-02-02 ENCOUNTER — Telehealth: Payer: Self-pay | Admitting: Internal Medicine

## 2020-02-02 DIAGNOSIS — Z96611 Presence of right artificial shoulder joint: Secondary | ICD-10-CM | POA: Diagnosis not present

## 2020-02-02 DIAGNOSIS — K5901 Slow transit constipation: Secondary | ICD-10-CM | POA: Diagnosis not present

## 2020-02-02 DIAGNOSIS — G8929 Other chronic pain: Secondary | ICD-10-CM | POA: Diagnosis not present

## 2020-02-02 DIAGNOSIS — K449 Diaphragmatic hernia without obstruction or gangrene: Secondary | ICD-10-CM | POA: Diagnosis not present

## 2020-02-02 DIAGNOSIS — Z86010 Personal history of colonic polyps: Secondary | ICD-10-CM | POA: Diagnosis not present

## 2020-02-02 DIAGNOSIS — F329 Major depressive disorder, single episode, unspecified: Secondary | ICD-10-CM | POA: Diagnosis not present

## 2020-02-02 DIAGNOSIS — R339 Retention of urine, unspecified: Secondary | ICD-10-CM | POA: Diagnosis not present

## 2020-02-02 DIAGNOSIS — K5790 Diverticulosis of intestine, part unspecified, without perforation or abscess without bleeding: Secondary | ICD-10-CM | POA: Diagnosis not present

## 2020-02-02 DIAGNOSIS — D631 Anemia in chronic kidney disease: Secondary | ICD-10-CM | POA: Diagnosis not present

## 2020-02-02 DIAGNOSIS — Z96653 Presence of artificial knee joint, bilateral: Secondary | ICD-10-CM | POA: Diagnosis not present

## 2020-02-02 DIAGNOSIS — I129 Hypertensive chronic kidney disease with stage 1 through stage 4 chronic kidney disease, or unspecified chronic kidney disease: Secondary | ICD-10-CM | POA: Diagnosis not present

## 2020-02-02 DIAGNOSIS — Z466 Encounter for fitting and adjustment of urinary device: Secondary | ICD-10-CM | POA: Diagnosis not present

## 2020-02-02 DIAGNOSIS — K219 Gastro-esophageal reflux disease without esophagitis: Secondary | ICD-10-CM | POA: Diagnosis not present

## 2020-02-02 DIAGNOSIS — N319 Neuromuscular dysfunction of bladder, unspecified: Secondary | ICD-10-CM | POA: Diagnosis not present

## 2020-02-02 DIAGNOSIS — M4800 Spinal stenosis, site unspecified: Secondary | ICD-10-CM | POA: Diagnosis not present

## 2020-02-02 DIAGNOSIS — E1122 Type 2 diabetes mellitus with diabetic chronic kidney disease: Secondary | ICD-10-CM | POA: Diagnosis not present

## 2020-02-02 DIAGNOSIS — Z853 Personal history of malignant neoplasm of breast: Secondary | ICD-10-CM | POA: Diagnosis not present

## 2020-02-02 DIAGNOSIS — I4510 Unspecified right bundle-branch block: Secondary | ICD-10-CM | POA: Diagnosis not present

## 2020-02-02 DIAGNOSIS — N184 Chronic kidney disease, stage 4 (severe): Secondary | ICD-10-CM | POA: Diagnosis not present

## 2020-02-02 DIAGNOSIS — Z7984 Long term (current) use of oral hypoglycemic drugs: Secondary | ICD-10-CM | POA: Diagnosis not present

## 2020-02-02 DIAGNOSIS — M5136 Other intervertebral disc degeneration, lumbar region: Secondary | ICD-10-CM | POA: Diagnosis not present

## 2020-02-02 DIAGNOSIS — E785 Hyperlipidemia, unspecified: Secondary | ICD-10-CM | POA: Diagnosis not present

## 2020-02-02 DIAGNOSIS — M1612 Unilateral primary osteoarthritis, left hip: Secondary | ICD-10-CM | POA: Diagnosis not present

## 2020-02-02 DIAGNOSIS — Z7982 Long term (current) use of aspirin: Secondary | ICD-10-CM | POA: Diagnosis not present

## 2020-02-02 NOTE — Telephone Encounter (Signed)
Please see all questions. Please advise.

## 2020-02-02 NOTE — Telephone Encounter (Signed)
[place]er Plan [phone]) from [facility] is needing nurseing verbal orders for once every other 2 weeks for 9 weeks with 2 PRN's just in case there is a problem.   She is complaining about skin irritation around the catheter. The nurse says that it looks lie it is being pulled on.  She also has constipation and needs instructions on pain medication and to monitor constipation.  Please advise

## 2020-02-03 ENCOUNTER — Other Ambulatory Visit: Payer: Self-pay | Admitting: *Deleted

## 2020-02-03 ENCOUNTER — Telehealth: Payer: Self-pay | Admitting: Internal Medicine

## 2020-02-03 DIAGNOSIS — N184 Chronic kidney disease, stage 4 (severe): Secondary | ICD-10-CM | POA: Diagnosis not present

## 2020-02-03 DIAGNOSIS — E1122 Type 2 diabetes mellitus with diabetic chronic kidney disease: Secondary | ICD-10-CM | POA: Diagnosis not present

## 2020-02-03 DIAGNOSIS — I129 Hypertensive chronic kidney disease with stage 1 through stage 4 chronic kidney disease, or unspecified chronic kidney disease: Secondary | ICD-10-CM | POA: Diagnosis not present

## 2020-02-03 DIAGNOSIS — Z466 Encounter for fitting and adjustment of urinary device: Secondary | ICD-10-CM | POA: Diagnosis not present

## 2020-02-03 DIAGNOSIS — N319 Neuromuscular dysfunction of bladder, unspecified: Secondary | ICD-10-CM | POA: Diagnosis not present

## 2020-02-03 DIAGNOSIS — M1612 Unilateral primary osteoarthritis, left hip: Secondary | ICD-10-CM | POA: Diagnosis not present

## 2020-02-03 NOTE — Telephone Encounter (Signed)
Please see message. °

## 2020-02-03 NOTE — Telephone Encounter (Signed)
I dont  Prescribe   Pain management   Please have whichever specialist is seeing her    Or refer to rehab or pain mangemnt for  Medication

## 2020-02-03 NOTE — Telephone Encounter (Signed)
[place]er Plan called to ask if she can get orders on MS contin 15mg  and Roxicodone 5mg  for the pt [name]  Pt is in pain again.  Please call Santiago Glad call back [phone]  Please advise

## 2020-02-03 NOTE — Telephone Encounter (Signed)
Will not be doing pain management   See previous notes and haven't seen her  Since her  Admission and rehab   Advise  The  Rehab or( pain management) providers  Help  manage  pain meds and complications  problem     she can be seen and evaluated  In office  Otherwise

## 2020-02-03 NOTE — Patient Outreach (Signed)
[facility]) Care Management  02/03/2020  [name] 1929-08-03 [id number]   Referral Date: 7/26 Referral Source: Post Acute Care Coordinator Date of Admission: 6/25 (from hospital to SNF) Diagnosis: Intractable pain Date of Discharge: 7/27 Facility: [facility] Insurance: Next Gen  Outreach attempt #1, successful, identity verified.  This care manager introduced self and stated purpose of call.  [facility] care management services explained.    Member report having [facility] for home health, declines offer for services.  Difference between [facility] and home health explained, advised that involvement will not interfere with home health.  She then state that she does not like to work with agencies over the phone, prefer to have some one face to face.  Discussed having [facility] come into the home for assessment of needs, she declines stating that she feel [facility] will be enough.  Also state that she would like to discuss engagement with her daughter before giving consent.  Plan: RN CM will send [facility] brochure and this care manager's contact information for member to review.  Will follow up with member and daughter within the next week.  Valente David, [place], MSN [place] [phone]

## 2020-02-04 ENCOUNTER — Telehealth: Payer: Self-pay | Admitting: Internal Medicine

## 2020-02-04 NOTE — Telephone Encounter (Signed)
Called patient and LMOVM to return call  Left a detailed message of the message from Dr. Regis Bill on Karen's VM.

## 2020-02-04 NOTE — Telephone Encounter (Signed)
Called patient and LMOVM to return call  Left a detailed voice message of the message from Dr. Regis Bill on Karen's VM.

## 2020-02-04 NOTE — Telephone Encounter (Signed)
Pt want a referral to pain management.

## 2020-02-07 ENCOUNTER — Other Ambulatory Visit: Payer: Self-pay

## 2020-02-07 DIAGNOSIS — M19049 Primary osteoarthritis, unspecified hand: Secondary | ICD-10-CM

## 2020-02-07 DIAGNOSIS — M549 Dorsalgia, unspecified: Secondary | ICD-10-CM

## 2020-02-07 DIAGNOSIS — R52 Pain, unspecified: Secondary | ICD-10-CM

## 2020-02-07 DIAGNOSIS — M25559 Pain in unspecified hip: Secondary | ICD-10-CM

## 2020-02-07 NOTE — Telephone Encounter (Signed)
I have placed an urgent referral to pain management for patient. Received verbal ok per Dr. Sarajane Jews and previous note given OK by Dr. Regis Bill.

## 2020-02-08 DIAGNOSIS — M545 Low back pain: Secondary | ICD-10-CM | POA: Diagnosis not present

## 2020-02-10 DIAGNOSIS — N184 Chronic kidney disease, stage 4 (severe): Secondary | ICD-10-CM | POA: Diagnosis not present

## 2020-02-10 DIAGNOSIS — E1122 Type 2 diabetes mellitus with diabetic chronic kidney disease: Secondary | ICD-10-CM | POA: Diagnosis not present

## 2020-02-10 DIAGNOSIS — I129 Hypertensive chronic kidney disease with stage 1 through stage 4 chronic kidney disease, or unspecified chronic kidney disease: Secondary | ICD-10-CM | POA: Diagnosis not present

## 2020-02-10 DIAGNOSIS — M1612 Unilateral primary osteoarthritis, left hip: Secondary | ICD-10-CM | POA: Diagnosis not present

## 2020-02-10 DIAGNOSIS — Z466 Encounter for fitting and adjustment of urinary device: Secondary | ICD-10-CM | POA: Diagnosis not present

## 2020-02-10 DIAGNOSIS — N319 Neuromuscular dysfunction of bladder, unspecified: Secondary | ICD-10-CM | POA: Diagnosis not present

## 2020-02-11 ENCOUNTER — Other Ambulatory Visit: Payer: Self-pay | Admitting: *Deleted

## 2020-02-11 DIAGNOSIS — Z466 Encounter for fitting and adjustment of urinary device: Secondary | ICD-10-CM | POA: Diagnosis not present

## 2020-02-11 DIAGNOSIS — I129 Hypertensive chronic kidney disease with stage 1 through stage 4 chronic kidney disease, or unspecified chronic kidney disease: Secondary | ICD-10-CM | POA: Diagnosis not present

## 2020-02-11 DIAGNOSIS — E1122 Type 2 diabetes mellitus with diabetic chronic kidney disease: Secondary | ICD-10-CM | POA: Diagnosis not present

## 2020-02-11 DIAGNOSIS — N319 Neuromuscular dysfunction of bladder, unspecified: Secondary | ICD-10-CM | POA: Diagnosis not present

## 2020-02-11 DIAGNOSIS — N184 Chronic kidney disease, stage 4 (severe): Secondary | ICD-10-CM | POA: Diagnosis not present

## 2020-02-11 DIAGNOSIS — M1612 Unilateral primary osteoarthritis, left hip: Secondary | ICD-10-CM | POA: Diagnosis not present

## 2020-02-11 NOTE — Patient Outreach (Signed)
[facility]) Care Management  02/11/2020  [name] 1930/04/20 [id number]   Call placed to member to attempt engagement and assessment of needs as she initially declined services.  No answer, HIPAA compliant voice message left.  Call also placed to member's daughter [name], no answer, HIPAA compliant voice message left.  Will follow up with member and daughter within the next 3-4 business days.  [name], [place], MSN [place] [phone]

## 2020-02-14 ENCOUNTER — Other Ambulatory Visit: Payer: Self-pay | Admitting: *Deleted

## 2020-02-14 NOTE — Patient Outreach (Signed)
[facility]) Care Management  02/14/2020  [name] 1929/07/17 [id number]   Voice message received back from Ms. [name], member's daughter, after missed call.  Call placed to daughter however she report this is not a good time to talk, waiting on an important call.  Request to call this care manager back later this afternoon or tomorrow.  Will await call back, if no calk back will make another attempt within the next 3 days as planned.  Valente David, [place], MSN [place] [phone]

## 2020-02-15 DIAGNOSIS — E1122 Type 2 diabetes mellitus with diabetic chronic kidney disease: Secondary | ICD-10-CM | POA: Diagnosis not present

## 2020-02-15 DIAGNOSIS — N184 Chronic kidney disease, stage 4 (severe): Secondary | ICD-10-CM | POA: Diagnosis not present

## 2020-02-15 DIAGNOSIS — I129 Hypertensive chronic kidney disease with stage 1 through stage 4 chronic kidney disease, or unspecified chronic kidney disease: Secondary | ICD-10-CM | POA: Diagnosis not present

## 2020-02-15 DIAGNOSIS — N319 Neuromuscular dysfunction of bladder, unspecified: Secondary | ICD-10-CM | POA: Diagnosis not present

## 2020-02-15 DIAGNOSIS — M1612 Unilateral primary osteoarthritis, left hip: Secondary | ICD-10-CM | POA: Diagnosis not present

## 2020-02-15 DIAGNOSIS — Z466 Encounter for fitting and adjustment of urinary device: Secondary | ICD-10-CM | POA: Diagnosis not present

## 2020-02-15 NOTE — Progress Notes (Signed)
Chief Complaint  Patient presents with   Hospitalization Follow-up    having lower back pain    HPI: [name] 86 y.o. come in w daughter today   for post hosp and rehab snif for   Back pain spinal stenosis requiring  Narcotics and rehab        She has since seen dr Nelva Bush   And had left hip pain evaluated  And better with shot  But  Back pain and some left pain and poss left lttle toe sx remain  . She is to have consults with NS dr Arnoldo Morale  currently using percocet  bid for pain     Getting pt  And [facility]    Hard to get out of bed  And back in bed    Her husband in older than her 29 trying to help .   DM ok bg usually below 200 has been eating less sugar  No lows   Needs refill gabapentin she takes hs   And nexium to  Mail away  rx   Gets nausea at times  Not as good an eater but ok    Feels has acute uti today for the past few days   Having dysuria   No fever chills   She has the catherer as needed for neuro genic bladder but voids on her own .   ROS: See pertinent positives and negatives per HPI. 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 , HER2/neu negative-- s/p right lumpectomy (without SLNB) and radiation therapy   Chronic lower back pain    CKD (chronic kidney disease) stage 3, GFR 30-59 ml/min    DDD (degenerative disc disease)    lumbar   Diverticulosis    Family history of breast cancer    Family history of pancreatic cancer    Family history of prostate cancer    Flaccid neuropathic bladder, not elsewhere classified    Foley catheter in place    GERD (gastroesophageal reflux disease)    Hemorrhoids    Hiatal hernia     History of colon polyps    History of radiation therapy 09-12-2015 to 10-12-2015   42.72 gray in 16 fractions directed right breast w/ boost of 12 gray in 6 fractions directed at the lumpectomy cavity- Total dose: 54.72y   History of recurrent UTIs    Hyperlipidemia    PONV (postoperative nausea and vomiting)    RBBB (right bundle branch block with left anterior fascicular block)    Type 2 diabetes mellitus ([place])    Urinary retention with incomplete bladder emptying    Wears dentures    full upper and lower partial   Wears glasses    Wears hearing aid    bilateral but does not wear    Family History  Problem Relation Age of Onset   Pancreatic cancer Mother 9   Diabetes Mother    Prostate cancer Brother    Diabetes Father    Heart disease Father    Diabetes Brother    Heart attack Brother    Diabetes Sister  Diabetes Daughter    Diabetes Son    Coronary artery disease Brother        MI in his 8s   Colon cancer Neg Hx    Stomach cancer Neg Hx     Social History   Socioeconomic History   Marital status: Married    Spouse name: Not on file   Number of children: 2   Years of education: Not on file   Highest education level: Not on file  Occupational History   Occupation: Retired  Tobacco Use   Smoking status: Never Smoker   Smokeless tobacco: Never Used  Scientific laboratory technician Use: Never used  Substance and Sexual Activity   Alcohol use: No   Drug use: No   Sexual activity: Never  Other Topics Concern   Not on file  Social History Narrative   HH 2 married   Works for [employer] for 40 years retired   No pets    Lives w spouse; married in 29; 84 years    Social Determinants of Radio broadcast assistant Strain:    Difficulty of Paying Living Expenses:   Food Insecurity:    Worried About Charity fundraiser in the Last Year:    Arboriculturist in the Last Year:   Transportation Needs:    Film/video editor  (Medical):    Lack of Transportation (Non-Medical):   Physical Activity:    Days of Exercise per Week:    Minutes of Exercise per Session:   Stress:    Feeling of Stress :   Social Connections:    Frequency of Communication with Friends and Family:    Frequency of Social Gatherings with Friends and Family:    Attends Religious Services:    Active Member of Clubs or Organizations:    Attends Archivist Meetings:    Marital Status:     Outpatient Medications Prior to Visit  Medication Sig Dispense Refill   aspirin EC 81 MG tablet Take 81 mg by mouth daily.     atorvastatin (LIPITOR) 10 MG tablet Take 1 tablet (10 mg total) by mouth daily. 30 tablet 0   bisacodyl (DULCOLAX) 10 MG suppository Place 1 suppository (10 mg total) rectally daily as needed for moderate constipation. 12 suppository 0   Calcium Carbonate-Vitamin D (CALCIUM 600/VITAMIN D PO) Take 1 capsule by mouth 2 (two) times daily.     Cholecalciferol (VITAMIN D) 50 MCG (2000 UT) tablet Take 2,000 Units by mouth daily.     cyanocobalamin 2000 MCG tablet Take 2,000 mcg by mouth daily.     furosemide (LASIX) 40 MG tablet Take 1 tablet (40 mg total) by mouth daily. Or as directed 30 tablet 0   glipiZIDE (GLUCOTROL XL) 5 MG 24 hr tablet Take 1 tablet (5 mg total) by mouth every evening. 30 tablet 0   glipiZIDE (GLUCOTROL) 10 MG tablet Take 1 tablet (10 mg total) by mouth in the morning. 30 tablet 0   oxyCODONE-acetaminophen (PERCOCET) 10-325 MG tablet Percocet 10 mg-325 mg tablet  Take 1 tablet 3 times a day by oral route as needed for pain.     Potassium 99 MG TABS Take 1 tablet (99 mg total) by mouth daily. 30 tablet 0   senna-docusate (SENOKOT-S) 8.6-50 MG tablet Take 2 tablets by mouth 2 (two) times daily.     esomeprazole (NEXIUM) 40 MG capsule TAKE 1 CAPSULE(40 MG) BY MOUTH DAILY 30 capsule 2   gabapentin (  NEURONTIN) 300 MG capsule Take 1 capsule (300 mg total) by mouth at bedtime. 30  capsule 0   acetaminophen (TYLENOL) 500 MG tablet Take 500 mg by mouth every 6 (six) hours as needed for mild pain. (Patient not taking: Reported on 02/16/2020)     amLODipine (NORVASC) 5 MG tablet Take 1 tablet (5 mg total) by mouth daily. (Patient not taking: Reported on 02/16/2020)     ascorbic acid (VITAMIN C) 500 MG tablet Take 500 mg by mouth daily. (Patient not taking: Reported on 02/16/2020)     Magnesium Hydroxide (DULCOLAX PO) Take 10 mg by mouth every other day. (Patient not taking: Reported on 02/16/2020)     ondansetron (ZOFRAN) 4 MG tablet Take 1 tablet (4 mg total) by mouth every 8 (eight) hours as needed for nausea or vomiting. (Patient not taking: Reported on 02/16/2020) 20 tablet 0   polyethylene glycol (MIRALAX / GLYCOLAX) 17 g packet Take 17 g by mouth daily as needed for moderate constipation or severe constipation. (Patient not taking: Reported on 02/16/2020) 14 each 0   No facility-administered medications prior to visit.     EXAM:  BP 132/64    Pulse 93    Temp 98.4 F (36.9 C) (Oral)    Ht 5' 0.5" (1.537 m)    Wt 146 lb 3.2 oz (66.3 kg)    SpO2 95%    BMI 28.08 kg/m   Body mass index is 28.08 kg/m.  GENERAL: vitals reviewed and listed above, alert, oriented, appears well hydrated and in no acute distress in wc  HEENT: atraumatic, conjunctiva  clear, no obvious abnormalities on inspection of external nose and ears OP : no lesion edema or exudate  NECK: no obvious masses on inspection palpation  LUNGS: clear to auscultation bilaterally, no wheezes, rales or rhonchi, good air movement CV: HRRR, no clubbing cyanosis or  peripheral edema nl cap refill  Back  Healed midline scar and points to low LS are  And  Bilateral  MS: moves all extremities   In WC today   PSYCH: pleasant and cooperative, no obvious depression or anxiety Left foot looks nl  Little toe without ulcer or infection  Sensation? ,Pulses present nl cap refill Lab Results  Component Value Date   WBC  8.9 01/04/2020   HGB 13.4 01/04/2020   HCT 39 01/04/2020   PLT 232 01/04/2020   GLUCOSE 170 (H) 12/29/2019   CHOL 180 08/31/2019   TRIG 323.0 (H) 08/31/2019   HDL 35.80 (L) 08/31/2019   LDLDIRECT 81.0 08/31/2019   LDLCALC 83 12/16/2013   ALT 11 08/31/2019   AST 13 08/31/2019   NA 131 (A) 01/04/2020   K 4.3 01/04/2020   CL 90 (A) 01/04/2020   CREATININE 1.3 (A) 01/04/2020   BUN 37 (A) 01/04/2020   CO2 26 (A) 01/04/2020   TSH 3.25 08/28/2018   INR 1.0 12/25/2019   HGBA1C 7.3 (H) 12/25/2019   MICROALBUR 10.3 (H) 08/31/2019   BP Readings from Last 3 Encounters:  02/16/20 132/64  01/31/20 111/66  01/25/20 122/87   Wt Readings from Last 3 Encounters:  02/16/20 146 lb 3.2 oz (66.3 kg)  01/31/20 153 lb 9.6 oz (69.7 kg)  01/04/20 160 lb (72.6 kg)   Lab reviewed from hosp Urinalysis    Component Value Date/Time   COLORURINE YELLOW 03/24/2016 0906   APPEARANCEUR TURBID (A) 03/24/2016 0906   LABSPEC 1.008 03/24/2016 0906   PHURINE 7.0 03/24/2016 0906   GLUCOSEU NEGATIVE 03/24/2016 9417  HGBUR MODERATE (A) 03/24/2016 0906   HGBUR negative 11/15/2009 0000   BILIRUBINUR Negative 02/16/2020 1230   KETONESUR NEGATIVE 03/24/2016 0906   PROTEINUR Positive (A) 02/16/2020 1230   PROTEINUR NEGATIVE 03/24/2016 0906   UROBILINOGEN 0.2 02/16/2020 1230   UROBILINOGEN 0.2 11/15/2009 0000   NITRITE Positive 02/16/2020 1230   NITRITE POSITIVE (A) 03/24/2016 0906   LEUKOCYTESUR Large (3+) (A) 02/16/2020 1230  QNS for cx    ASSESSMENT AND PLAN:  Discussed the following assessment and plan:  Spinal stenosis of lumbar region, unspecified whether neurogenic claudication present - Plan: Basic metabolic panel, CBC with Differential/Platelet, Hemoglobin A1c, Hepatic function panel, Lipid panel  Suspected UTI - Plan: POCT urinalysis dipstick, Urine Culture  Arthritis of hip  Mobility impaired - Plan: Basic metabolic panel, CBC with Differential/Platelet, Hemoglobin A1c, Hepatic function  panel, Lipid panel  Gastroesophageal reflux disease without esophagitis  Medication management - Plan: Basic metabolic panel, CBC with Differential/Platelet, Hemoglobin A1c, Hepatic function panel, Lipid panel  Type 2 diabetes mellitus with other specified complication, without long-term current use of insulin (HCC) - Plan: Basic metabolic panel, CBC with Differential/Platelet, Hemoglobin A1c, Hepatic function panel, Lipid panel  Essential hypertension - Plan: Basic metabolic panel, CBC with Differential/Platelet, Hemoglobin A1c, Hepatic function panel, Lipid panel  Renal insufficiency - Plan: Basic metabolic panel, CBC with Differential/Platelet, Hemoglobin A1c, Hepatic function panel, Lipid panel  Pain management - per dr Nelva Bush Unable to give more specimen and avoid bag cx   Shared Decision Making will rx empirically with keflex and if not better    sterile container give to get ur cx at the [facility] lab if needed rx for hospital bed   May need input from specialists  .  Dm adequate  For now plan fu in   The fall with labs etc  After above evaluated  -Patient advised to return or notify health care team  if  new concerns arise. 50 minutes  Of visit review plan and face to face with patient and daughter  Patient [facility] bed  Scrip .   If needed we may need help from Dr Nelva Bush.  Continue physical therapy.   Treat for uti and if not better let us know and plan urine culture tests at [place] lab.  To be send within 2 hours  Of specifimen   Due for blood work  And   a1c in September October .   I can put in future orders    In the system    For times Will refill the gabapenin and nexium to [facility].      Standley Brooking. Kaleigh Spiegelman M.D.

## 2020-02-16 ENCOUNTER — Encounter: Payer: Self-pay | Admitting: Internal Medicine

## 2020-02-16 ENCOUNTER — Ambulatory Visit (INDEPENDENT_AMBULATORY_CARE_PROVIDER_SITE_OTHER): Payer: Medicare Other | Admitting: Internal Medicine

## 2020-02-16 ENCOUNTER — Other Ambulatory Visit: Payer: Self-pay

## 2020-02-16 VITALS — BP 132/64 | HR 93 | Temp 98.4°F | Ht 60.5 in | Wt 146.2 lb

## 2020-02-16 DIAGNOSIS — R52 Pain, unspecified: Secondary | ICD-10-CM | POA: Diagnosis not present

## 2020-02-16 DIAGNOSIS — M48061 Spinal stenosis, lumbar region without neurogenic claudication: Secondary | ICD-10-CM

## 2020-02-16 DIAGNOSIS — K219 Gastro-esophageal reflux disease without esophagitis: Secondary | ICD-10-CM | POA: Diagnosis not present

## 2020-02-16 DIAGNOSIS — Z7409 Other reduced mobility: Secondary | ICD-10-CM

## 2020-02-16 DIAGNOSIS — N289 Disorder of kidney and ureter, unspecified: Secondary | ICD-10-CM

## 2020-02-16 DIAGNOSIS — I10 Essential (primary) hypertension: Secondary | ICD-10-CM

## 2020-02-16 DIAGNOSIS — Z79899 Other long term (current) drug therapy: Secondary | ICD-10-CM | POA: Diagnosis not present

## 2020-02-16 DIAGNOSIS — M1610 Unilateral primary osteoarthritis, unspecified hip: Secondary | ICD-10-CM

## 2020-02-16 DIAGNOSIS — E1169 Type 2 diabetes mellitus with other specified complication: Secondary | ICD-10-CM | POA: Diagnosis not present

## 2020-02-16 DIAGNOSIS — R3989 Other symptoms and signs involving the genitourinary system: Secondary | ICD-10-CM

## 2020-02-16 LAB — POCT URINALYSIS DIPSTICK
Bilirubin, UA: NEGATIVE
Blood, UA: NEGATIVE
Glucose, UA: NEGATIVE
Ketones, UA: NEGATIVE
Nitrite, UA: POSITIVE
Protein, UA: POSITIVE — AB
Spec Grav, UA: 1.02 (ref 1.010–1.025)
Urobilinogen, UA: 0.2 E.U./dL
pH, UA: 6 (ref 5.0–8.0)

## 2020-02-16 MED ORDER — ONDANSETRON 4 MG PO TBDP
4.0000 mg | ORAL_TABLET | Freq: Three times a day (TID) | ORAL | 0 refills | Status: DC | PRN
Start: 2020-02-16 — End: 2020-05-17

## 2020-02-16 MED ORDER — CEPHALEXIN 500 MG PO CAPS
500.0000 mg | ORAL_CAPSULE | Freq: Three times a day (TID) | ORAL | 0 refills | Status: AC
Start: 2020-02-16 — End: 2020-02-21

## 2020-02-16 MED ORDER — GABAPENTIN 300 MG PO CAPS: 300.0000 mg | ORAL_CAPSULE | Freq: Every day | ORAL | 1 refills | Status: DC

## 2020-02-16 MED ORDER — ESOMEPRAZOLE MAGNESIUM 40 MG PO CPDR: DELAYED_RELEASE_CAPSULE | ORAL | 1 refills | Status: DC

## 2020-02-16 NOTE — Patient Instructions (Addendum)
Hospital bed  Scrip .   If needed we may need help from Dr Nelva Bush.  Continue physical therapy.   Treat for uti and if not better let us know and plan urine culture tests at [place] lab.  To be send within 2 hours  Of specifimen   Due for blood work  And   a1c in September October .   I can put in future orders    In the system    For times Will refill the gabapenin and nexium to [facility].

## 2020-02-17 ENCOUNTER — Other Ambulatory Visit: Payer: Self-pay | Admitting: *Deleted

## 2020-02-17 DIAGNOSIS — E1122 Type 2 diabetes mellitus with diabetic chronic kidney disease: Secondary | ICD-10-CM | POA: Diagnosis not present

## 2020-02-17 DIAGNOSIS — N319 Neuromuscular dysfunction of bladder, unspecified: Secondary | ICD-10-CM | POA: Diagnosis not present

## 2020-02-17 DIAGNOSIS — I129 Hypertensive chronic kidney disease with stage 1 through stage 4 chronic kidney disease, or unspecified chronic kidney disease: Secondary | ICD-10-CM | POA: Diagnosis not present

## 2020-02-17 DIAGNOSIS — Z466 Encounter for fitting and adjustment of urinary device: Secondary | ICD-10-CM | POA: Diagnosis not present

## 2020-02-17 DIAGNOSIS — M1612 Unilateral primary osteoarthritis, left hip: Secondary | ICD-10-CM | POA: Diagnosis not present

## 2020-02-17 DIAGNOSIS — N184 Chronic kidney disease, stage 4 (severe): Secondary | ICD-10-CM | POA: Diagnosis not present

## 2020-02-17 NOTE — Patient Outreach (Signed)
[facility]) Care Management  02/17/2020  [name] [birth year] [id number]   Outreach attempt #2, unsuccessful to daughter.  HIPAA complaint voice message left.  Will make 3rd attempt to engage and assess needs of member within the next 3-4 business days.  Valente David, [place], MSN [place] [phone]

## 2020-02-18 ENCOUNTER — Other Ambulatory Visit (HOSPITAL_COMMUNITY): Payer: Self-pay | Admitting: Neurosurgery

## 2020-02-18 ENCOUNTER — Ambulatory Visit (HOSPITAL_COMMUNITY)
Admission: RE | Admit: 2020-02-18 | Discharge: 2020-02-18 | Disposition: A | Discharge status: Home / Self Care | Payer: Medicare Other | Source: Ambulatory Visit | Attending: Internal Medicine | Admitting: Internal Medicine

## 2020-02-18 ENCOUNTER — Other Ambulatory Visit: Payer: Self-pay

## 2020-02-18 DIAGNOSIS — Z6828 Body mass index (BMI) 28.0-28.9, adult: Secondary | ICD-10-CM | POA: Diagnosis not present

## 2020-02-18 DIAGNOSIS — M7989 Other specified soft tissue disorders: Secondary | ICD-10-CM | POA: Insufficient documentation

## 2020-02-18 DIAGNOSIS — Z466 Encounter for fitting and adjustment of urinary device: Secondary | ICD-10-CM | POA: Diagnosis not present

## 2020-02-18 DIAGNOSIS — E1122 Type 2 diabetes mellitus with diabetic chronic kidney disease: Secondary | ICD-10-CM | POA: Diagnosis not present

## 2020-02-18 DIAGNOSIS — I129 Hypertensive chronic kidney disease with stage 1 through stage 4 chronic kidney disease, or unspecified chronic kidney disease: Secondary | ICD-10-CM | POA: Diagnosis not present

## 2020-02-18 DIAGNOSIS — M1612 Unilateral primary osteoarthritis, left hip: Secondary | ICD-10-CM | POA: Diagnosis not present

## 2020-02-18 DIAGNOSIS — M4316 Spondylolisthesis, lumbar region: Secondary | ICD-10-CM | POA: Diagnosis not present

## 2020-02-18 DIAGNOSIS — M48062 Spinal stenosis, lumbar region with neurogenic claudication: Secondary | ICD-10-CM | POA: Diagnosis not present

## 2020-02-18 DIAGNOSIS — N184 Chronic kidney disease, stage 4 (severe): Secondary | ICD-10-CM | POA: Diagnosis not present

## 2020-02-18 DIAGNOSIS — N319 Neuromuscular dysfunction of bladder, unspecified: Secondary | ICD-10-CM | POA: Diagnosis not present

## 2020-02-18 NOTE — Progress Notes (Signed)
Lower extremity venous LT study completed  Preliminary results relayed to [name].  See CV Proc for preliminary results report.   [name]

## 2020-02-21 ENCOUNTER — Other Ambulatory Visit: Payer: Self-pay | Admitting: Neurosurgery

## 2020-02-21 DIAGNOSIS — I129 Hypertensive chronic kidney disease with stage 1 through stage 4 chronic kidney disease, or unspecified chronic kidney disease: Secondary | ICD-10-CM | POA: Diagnosis not present

## 2020-02-21 DIAGNOSIS — E1122 Type 2 diabetes mellitus with diabetic chronic kidney disease: Secondary | ICD-10-CM | POA: Diagnosis not present

## 2020-02-21 DIAGNOSIS — N184 Chronic kidney disease, stage 4 (severe): Secondary | ICD-10-CM | POA: Diagnosis not present

## 2020-02-21 DIAGNOSIS — N319 Neuromuscular dysfunction of bladder, unspecified: Secondary | ICD-10-CM | POA: Diagnosis not present

## 2020-02-21 DIAGNOSIS — Z466 Encounter for fitting and adjustment of urinary device: Secondary | ICD-10-CM | POA: Diagnosis not present

## 2020-02-21 DIAGNOSIS — M1612 Unilateral primary osteoarthritis, left hip: Secondary | ICD-10-CM | POA: Diagnosis not present

## 2020-02-22 ENCOUNTER — Telehealth: Payer: Self-pay

## 2020-02-22 ENCOUNTER — Other Ambulatory Visit: Payer: Self-pay | Admitting: *Deleted

## 2020-02-22 DIAGNOSIS — M1612 Unilateral primary osteoarthritis, left hip: Secondary | ICD-10-CM | POA: Diagnosis not present

## 2020-02-22 DIAGNOSIS — Z466 Encounter for fitting and adjustment of urinary device: Secondary | ICD-10-CM | POA: Diagnosis not present

## 2020-02-22 DIAGNOSIS — N184 Chronic kidney disease, stage 4 (severe): Secondary | ICD-10-CM | POA: Diagnosis not present

## 2020-02-22 DIAGNOSIS — I129 Hypertensive chronic kidney disease with stage 1 through stage 4 chronic kidney disease, or unspecified chronic kidney disease: Secondary | ICD-10-CM | POA: Diagnosis not present

## 2020-02-22 DIAGNOSIS — E1122 Type 2 diabetes mellitus with diabetic chronic kidney disease: Secondary | ICD-10-CM | POA: Diagnosis not present

## 2020-02-22 DIAGNOSIS — N319 Neuromuscular dysfunction of bladder, unspecified: Secondary | ICD-10-CM | POA: Diagnosis not present

## 2020-02-22 NOTE — Patient Outreach (Signed)
[facility]) Care Management  02/22/2020  [name] 12-02-1929 [id number]   Outreach attempt #3 to daughter, successful however she state she is driving and unable to talk at this time.  Requested her to call this care manager back when she is available.  Will await call back, if no call back will follow up with 4th attempt within the next 3 weeks.  Valente David, [place], MSN [place] [phone]

## 2020-02-22 NOTE — Telephone Encounter (Signed)
Received paperwork from [facility] for preop clearance. Placed in red folder. Please return once complete.

## 2020-02-23 ENCOUNTER — Other Ambulatory Visit: Payer: Self-pay | Admitting: *Deleted

## 2020-02-23 ENCOUNTER — Other Ambulatory Visit: Payer: Medicare Other

## 2020-02-23 ENCOUNTER — Telehealth: Payer: Self-pay

## 2020-02-23 ENCOUNTER — Telehealth: Payer: Self-pay | Admitting: Internal Medicine

## 2020-02-23 DIAGNOSIS — R3989 Other symptoms and signs involving the genitourinary system: Secondary | ICD-10-CM

## 2020-02-23 NOTE — Telephone Encounter (Signed)
Pts daughter stated that she dropped off a urine sample at the [place] office.

## 2020-02-23 NOTE — Telephone Encounter (Signed)
Form for hospital bed placed in red folder. Please return once complete. Thank you!

## 2020-02-23 NOTE — Telephone Encounter (Signed)
Please see message. I see that you ordered the urine culture today.

## 2020-02-23 NOTE — Patient Outreach (Signed)
[facility]) Care Management  02/23/2020  VINCENTA STEFFEY 1930-02-07 [id number]   Outreach attempt #4, successful to daughter Festus Barren.   Identity verified.  This care manager introduced self and stated purpose of call.  [facility] care management services explained.  She report member has been improving, now more independent.  Home health has been active with PT/OT and nursing, this may be the last week for PT/OT.  Difference between home health and [facility] explained, she verbalizes understanding.  Does not feel that member has any immediate needs at this time however she is having back surgery on 9/8, may have needs post surgery.  She request this care manager follow up after she is discharged for re-assessment of needs.  Will make contact with daughter post surgery, advised to contact this care manager with questions.  Valente David, [place], MSN [place] [phone]

## 2020-02-24 ENCOUNTER — Telehealth: Payer: Self-pay | Admitting: Internal Medicine

## 2020-02-24 NOTE — Telephone Encounter (Signed)
The patient sent a half of a bottle to [facility] lab yesterday and is wanting the results and needs a higher doser of antibiotics and she is still having burning and pain and still has a UTI.  Please advise

## 2020-02-25 ENCOUNTER — Telehealth: Payer: Self-pay | Admitting: Internal Medicine

## 2020-02-25 ENCOUNTER — Other Ambulatory Visit: Payer: Self-pay

## 2020-02-25 LAB — URINE CULTURE
MICRO NUMBER:: 10841881
SPECIMEN QUALITY:: ADEQUATE

## 2020-02-25 MED ORDER — SULFAMETHOXAZOLE-TRIMETHOPRIM 800-160 MG PO TABS: 1.0000 | ORAL_TABLET | Freq: Two times a day (BID) | ORAL | 0 refills | Status: DC

## 2020-02-25 NOTE — Telephone Encounter (Signed)
Please call or have someone call lab and find out when result are coming back  Did the keflex help at all    Or never got better

## 2020-02-25 NOTE — Telephone Encounter (Signed)
If not allergic to sulfa  Send  In septra DS ( generic) 1 po bid  For 5 days disp 10

## 2020-02-25 NOTE — Telephone Encounter (Signed)
Called [place] with [facility] and let her know that we have not received any forms from them about the bed. I gave Colletta Maryland our fax number and she is faxing the forms over. Colletta Maryland verbalized an understanding.

## 2020-02-25 NOTE — Telephone Encounter (Signed)
Need additional information for a bed for pt   Please call Laurence Slate at [phone] ext. [phone]  Please advise

## 2020-02-25 NOTE — Telephone Encounter (Signed)
Called patient and she stated that the Keflex did not help at all. She is having severe burning with urination and she stated that she has severe pain in her vagina.

## 2020-02-25 NOTE — Telephone Encounter (Signed)
Called and spoke to patients daughter and let her know that I have sent in the new antibiotic to the [facility] and patient stated that she is not allergic to sulfa. Both verbalized an understanding.

## 2020-02-25 NOTE — Telephone Encounter (Signed)
Please see message. I do not see the results from the culture yet.

## 2020-02-28 ENCOUNTER — Other Ambulatory Visit: Payer: Self-pay | Admitting: Neurosurgery

## 2020-02-28 DIAGNOSIS — N312 Flaccid neuropathic bladder, not elsewhere classified: Secondary | ICD-10-CM | POA: Diagnosis not present

## 2020-02-28 NOTE — Telephone Encounter (Signed)
Form placed in red folder for completion. Please return once complete. Thank you!

## 2020-02-29 ENCOUNTER — Ambulatory Visit: Payer: Medicare Other | Admitting: Internal Medicine

## 2020-02-29 NOTE — Progress Notes (Signed)
Bacteria  found is an unusual one  and we may need  to change antibiotic   Unless better  Megan please send in ciprofloxin  250 mg 1 po   every 12 hours for 5 days disp 10   this medication has a risk of tendon weakness and  tingling  symptoms and if gets this stop the medication and let us know

## 2020-03-01 ENCOUNTER — Other Ambulatory Visit: Payer: Self-pay

## 2020-03-01 DIAGNOSIS — Z466 Encounter for fitting and adjustment of urinary device: Secondary | ICD-10-CM | POA: Diagnosis not present

## 2020-03-01 DIAGNOSIS — E1122 Type 2 diabetes mellitus with diabetic chronic kidney disease: Secondary | ICD-10-CM | POA: Diagnosis not present

## 2020-03-01 DIAGNOSIS — I129 Hypertensive chronic kidney disease with stage 1 through stage 4 chronic kidney disease, or unspecified chronic kidney disease: Secondary | ICD-10-CM | POA: Diagnosis not present

## 2020-03-01 DIAGNOSIS — M1612 Unilateral primary osteoarthritis, left hip: Secondary | ICD-10-CM | POA: Diagnosis not present

## 2020-03-01 DIAGNOSIS — N319 Neuromuscular dysfunction of bladder, unspecified: Secondary | ICD-10-CM | POA: Diagnosis not present

## 2020-03-01 DIAGNOSIS — N184 Chronic kidney disease, stage 4 (severe): Secondary | ICD-10-CM | POA: Diagnosis not present

## 2020-03-01 MED ORDER — CIPROFLOXACIN HCL 250 MG PO TABS
250.0000 mg | ORAL_TABLET | Freq: Two times a day (BID) | ORAL | 0 refills | Status: AC
Start: 2020-03-01 — End: 2020-03-06

## 2020-03-02 NOTE — Telephone Encounter (Signed)
Anderson Malta w/[facility] is calling to check the status of the order for a hospital bed.  She is aware that the order is waiting on the providers approval and the providers is not in the office the afternoon on Thursday.  Anderson Malta stated you can call the office at [phone] if you have any question and speak with her or anyone.

## 2020-03-03 DIAGNOSIS — K449 Diaphragmatic hernia without obstruction or gangrene: Secondary | ICD-10-CM | POA: Diagnosis not present

## 2020-03-03 DIAGNOSIS — Z853 Personal history of malignant neoplasm of breast: Secondary | ICD-10-CM | POA: Diagnosis not present

## 2020-03-03 DIAGNOSIS — Z96611 Presence of right artificial shoulder joint: Secondary | ICD-10-CM | POA: Diagnosis not present

## 2020-03-03 DIAGNOSIS — Z86010 Personal history of colonic polyps: Secondary | ICD-10-CM | POA: Diagnosis not present

## 2020-03-03 DIAGNOSIS — M5136 Other intervertebral disc degeneration, lumbar region: Secondary | ICD-10-CM | POA: Diagnosis not present

## 2020-03-03 DIAGNOSIS — K5901 Slow transit constipation: Secondary | ICD-10-CM | POA: Diagnosis not present

## 2020-03-03 DIAGNOSIS — Z7982 Long term (current) use of aspirin: Secondary | ICD-10-CM | POA: Diagnosis not present

## 2020-03-03 DIAGNOSIS — N184 Chronic kidney disease, stage 4 (severe): Secondary | ICD-10-CM | POA: Diagnosis not present

## 2020-03-03 DIAGNOSIS — F329 Major depressive disorder, single episode, unspecified: Secondary | ICD-10-CM | POA: Diagnosis not present

## 2020-03-03 DIAGNOSIS — E1122 Type 2 diabetes mellitus with diabetic chronic kidney disease: Secondary | ICD-10-CM | POA: Diagnosis not present

## 2020-03-03 DIAGNOSIS — E785 Hyperlipidemia, unspecified: Secondary | ICD-10-CM | POA: Diagnosis not present

## 2020-03-03 DIAGNOSIS — I129 Hypertensive chronic kidney disease with stage 1 through stage 4 chronic kidney disease, or unspecified chronic kidney disease: Secondary | ICD-10-CM | POA: Diagnosis not present

## 2020-03-03 DIAGNOSIS — Z466 Encounter for fitting and adjustment of urinary device: Secondary | ICD-10-CM | POA: Diagnosis not present

## 2020-03-03 DIAGNOSIS — Z7984 Long term (current) use of oral hypoglycemic drugs: Secondary | ICD-10-CM | POA: Diagnosis not present

## 2020-03-03 DIAGNOSIS — M4800 Spinal stenosis, site unspecified: Secondary | ICD-10-CM | POA: Diagnosis not present

## 2020-03-03 DIAGNOSIS — K5790 Diverticulosis of intestine, part unspecified, without perforation or abscess without bleeding: Secondary | ICD-10-CM | POA: Diagnosis not present

## 2020-03-03 DIAGNOSIS — I4510 Unspecified right bundle-branch block: Secondary | ICD-10-CM | POA: Diagnosis not present

## 2020-03-03 DIAGNOSIS — R339 Retention of urine, unspecified: Secondary | ICD-10-CM | POA: Diagnosis not present

## 2020-03-03 DIAGNOSIS — N319 Neuromuscular dysfunction of bladder, unspecified: Secondary | ICD-10-CM | POA: Diagnosis not present

## 2020-03-03 DIAGNOSIS — Z96653 Presence of artificial knee joint, bilateral: Secondary | ICD-10-CM | POA: Diagnosis not present

## 2020-03-03 DIAGNOSIS — M1612 Unilateral primary osteoarthritis, left hip: Secondary | ICD-10-CM | POA: Diagnosis not present

## 2020-03-03 DIAGNOSIS — D631 Anemia in chronic kidney disease: Secondary | ICD-10-CM | POA: Diagnosis not present

## 2020-03-03 DIAGNOSIS — K219 Gastro-esophageal reflux disease without esophagitis: Secondary | ICD-10-CM | POA: Diagnosis not present

## 2020-03-03 DIAGNOSIS — G8929 Other chronic pain: Secondary | ICD-10-CM | POA: Diagnosis not present

## 2020-03-03 NOTE — Telephone Encounter (Signed)
Please see message. °

## 2020-03-07 ENCOUNTER — Ambulatory Visit: Payer: Medicare Other | Admitting: Internal Medicine

## 2020-03-09 ENCOUNTER — Encounter (HOSPITAL_COMMUNITY): Payer: Self-pay

## 2020-03-09 ENCOUNTER — Encounter (HOSPITAL_COMMUNITY)
Admission: RE | Admit: 2020-03-09 | Discharge: 2020-03-09 | Disposition: A | Discharge status: Home / Self Care | Payer: Medicare Other | Source: Ambulatory Visit | Attending: Neurosurgery | Admitting: Neurosurgery

## 2020-03-09 ENCOUNTER — Other Ambulatory Visit: Payer: Self-pay

## 2020-03-09 DIAGNOSIS — Z01812 Encounter for preprocedural laboratory examination: Secondary | ICD-10-CM | POA: Insufficient documentation

## 2020-03-09 LAB — CBC
HCT: 34.5 % — ABNORMAL LOW (ref 36.0–46.0)
Hemoglobin: 10.7 g/dL — ABNORMAL LOW (ref 12.0–15.0)
MCH: 28.8 pg (ref 26.0–34.0)
MCHC: 31 g/dL (ref 30.0–36.0)
MCV: 92.7 fL (ref 80.0–100.0)
Platelets: 250 10*3/uL (ref 150–400)
RBC: 3.72 MIL/uL — ABNORMAL LOW (ref 3.87–5.11)
RDW: 14.3 % (ref 11.5–15.5)
WBC: 6 10*3/uL (ref 4.0–10.5)
nRBC: 0 % (ref 0.0–0.2)

## 2020-03-09 LAB — BASIC METABOLIC PANEL
Anion gap: 9 (ref 5–15)
BUN: 20 mg/dL (ref 8–23)
CO2: 28 mmol/L (ref 22–32)
Calcium: 9.7 mg/dL (ref 8.9–10.3)
Chloride: 97 mmol/L — ABNORMAL LOW (ref 98–111)
Creatinine, Ser: 1.49 mg/dL — ABNORMAL HIGH (ref 0.44–1.00)
GFR calc Af Amer: 36 mL/min — ABNORMAL LOW (ref >60)
GFR calc non Af Amer: 31 mL/min — ABNORMAL LOW (ref >60)
Glucose, Bld: 183 mg/dL — ABNORMAL HIGH (ref 70–99)
Potassium: 4 mmol/L (ref 3.5–5.1)
Sodium: 134 mmol/L — ABNORMAL LOW (ref 135–145)

## 2020-03-09 LAB — HEMOGLOBIN A1C
Hgb A1c MFr Bld: 6.6 % — ABNORMAL HIGH (ref 4.8–5.6)
Mean Plasma Glucose: 142.72 mg/dL

## 2020-03-09 LAB — SURGICAL PCR SCREEN
MRSA, PCR: NEGATIVE
Staphylococcus aureus: NEGATIVE

## 2020-03-09 LAB — TYPE AND SCREEN
ABO/RH(D): O POS
Antibody Screen: NEGATIVE

## 2020-03-09 LAB — GLUCOSE, CAPILLARY: Glucose-Capillary: 177 mg/dL — ABNORMAL HIGH (ref 70–99)

## 2020-03-09 NOTE — Progress Notes (Signed)
PCP - Dr. Shanon Ace Cardiologist - denies  Chest x-ray - n/a EKG - 12/25/19 Stress Test - 2013 ECHO - denies Cardiac Cath - denies  Sleep Study - denies CPAP - denies  Fasting Blood Sugar - 120-170 Checks Blood Sugar 1 time a week CBG at PAT 177 Last A1C was 7.3 on 12/25/19  Blood Thinner Instructions:n/a Aspirin Instructions:Hold 5-7 days prior to surgery. LD w9/1/21  ERAS Protcol -n/a PRE-SURGERY Ensure or G2- n/a  COVID TEST- Scheduled for Saturday, Sept 4th.   Pt presented with a indwelling catheter. Pt states that she has had this catheter since 2017 d/t bladder dysfunction and the inability to urinate.   Anesthesia review: no  Patient denies shortness of breath, fever, cough and chest pain at PAT appointment   All instructions explained to the patient, with a verbal understanding of the material. Patient agrees to go over the instructions while at home for a better understanding. Patient also instructed to self quarantine after being tested for COVID-19. The opportunity to ask questions was provided.   Coronavirus Screening  Have you experienced the following symptoms:  Cough yes/no: No Fever (>100.72F)  yes/no: No Runny nose yes/no: No Sore throat yes/no: No Difficulty breathing/shortness of breath  yes/no: No  Have you or a family member traveled in the last 14 days and where? yes/no: No   If the patient indicates "YES" to the above questions, their PAT will be rescheduled to limit the exposure to others and, the surgeon will be notified. THE PATIENT WILL NEED TO BE ASYMPTOMATIC FOR 14 DAYS.   If the patient is not experiencing any of these symptoms, the PAT nurse will instruct them to NOT bring anyone with them to their appointment since they may have these symptoms or traveled as well.   Please remind your patients and families that hospital visitation restrictions are in effect and the importance of the restrictions.

## 2020-03-09 NOTE — Pre-Procedure Instructions (Signed)
Your procedure is scheduled on Wednesday, September 8th.  Report to [facility] Main Entrance "A" at 6:30 A.M., and check in at the Admitting office.  Call this number if you have problems the morning of surgery:  [phone]  Call [phone] if you have any questions prior to your surgery date Monday-Friday 8am-4pm    Remember:  Do not eat or drink after midnight the night before your surgery    Take these medicines the morning of surgery with A SIP OF WATER       atorvastatin (LIPITOR)-if scheduled to take this day.  esomeprazole (NEXIUM)  If needed: acetaminophen (TYLENOL)  ondansetron (ZOFRAN-ODT) oxyCODONE-acetaminophen (PERCOCET)  Follow your surgeon's instructions on when to stop Aspirin.  If no instructions were given by your surgeon then you will need to call the office to get those instructions.    As of today, STOP taking any Aspirin (unless otherwise instructed by your surgeon) Aleve, Naproxen, Ibuprofen, Motrin, Advil, Goody's, BC's, all herbal medications, fish oil, and all vitamins.   WHAT DO I DO ABOUT MY DIABETES MEDICATION?   Marland Kitchen Do not take oral diabetes medicines (pills) the morning of surgery.  . THE NIGHT BEFORE SURGERY, DO NOT TAKE evening dose of glipiZIDE (GLUCOTROL XL).      . THE MORNING OF SURGERY, DO NOT TAKE glipiZIDE (GLUCOTROL).    HOW TO MANAGE YOUR DIABETES BEFORE AND AFTER SURGERY  Why is it important to control my blood sugar before and after surgery? . Improving blood sugar levels before and after surgery helps healing and can limit problems. . A way of improving blood sugar control is eating a healthy diet by: o  Eating less sugar and carbohydrates o  Increasing activity/exercise o  Talking with your doctor about reaching your blood sugar goals . High blood sugars (greater than 180 mg/dL) can raise your risk of infections and slow your recovery, so you will need to focus on controlling your diabetes during the weeks before  surgery. . Make sure that the doctor who takes care of your diabetes knows about your planned surgery including the date and location.  How do I manage my blood sugar before surgery? . Check your blood sugar at least 4 times a day, starting 2 days before surgery, to make sure that the level is not too high or low. . Check your blood sugar the morning of your surgery when you wake up and every 2 hours until you get to the Short Stay unit. o If your blood sugar is less than 70 mg/dL, you will need to treat for low blood sugar: - Do not take insulin. - Treat a low blood sugar (less than 70 mg/dL) with  cup of clear juice (cranberry or apple), 4 glucose tablets, OR glucose gel. - Recheck blood sugar in 15 minutes after treatment (to make sure it is greater than 70 mg/dL). If your blood sugar is not greater than 70 mg/dL on recheck, call [phone] for further instructions. . Report your blood sugar to the short stay nurse when you get to Short Stay.  . If you are admitted to the hospital after surgery: o Your blood sugar will be checked by the staff and you will probably be given insulin after surgery (instead of oral diabetes medicines) to make sure you have good blood sugar levels. o The goal for blood sugar control after surgery is 80-180 mg/dL.  Do not wear jewelry, make up, or nail polish            Do not wear lotions, powders, perfumes/colognes, or deodorant.            Do not shave 48 hours prior to surgery.  Men may shave face and neck.            Do not bring valuables to the hospital.            [facility] is not responsible for any belongings or valuables.  Do NOT Smoke (Tobacco/Vaping) or drink Alcohol 24 hours prior to your procedure If you use a CPAP at night, you may bring all equipment for your overnight stay.   Contacts, glasses, dentures or bridgework may not be worn into surgery.      For patients admitted to the hospital, discharge time will be  determined by your treatment team.   Patients discharged the day of surgery will not be allowed to drive home, and someone needs to stay with them for 24 hours.    Special instructions:   [place]- Preparing For Surgery  Before surgery, you can play an important role. Because skin is not sterile, your skin needs to be as free of germs as possible. You can reduce the number of germs on your skin by washing with CHG (chlorahexidine gluconate) Soap before surgery.  CHG is an antiseptic cleaner which kills germs and bonds with the skin to continue killing germs even after washing.    Oral Hygiene is also important to reduce your risk of infection.  Remember - BRUSH YOUR TEETH THE MORNING OF SURGERY WITH YOUR REGULAR TOOTHPASTE  Please do not use if you have an allergy to CHG or antibacterial soaps. If your skin becomes reddened/irritated stop using the CHG.  Do not shave (including legs and underarms) for at least 48 hours prior to first CHG shower. It is OK to shave your face.  Please follow these instructions carefully.   1. Shower the NIGHT BEFORE SURGERY and the MORNING OF SURGERY with CHG Soap.   2. If you chose to wash your hair, wash your hair first as usual with your normal shampoo.  3. After you shampoo, rinse your hair and body thoroughly to remove the shampoo.  4. Use CHG as you would any other liquid soap. You can apply CHG directly to the skin and wash gently with a scrungie or a clean washcloth.   5. Apply the CHG Soap to your body ONLY FROM THE NECK DOWN.  Do not use on open wounds or open sores. Avoid contact with your eyes, ears, mouth and genitals (private parts). Wash Face and genitals (private parts)  with your normal soap.   6. Wash thoroughly, paying special attention to the area where your surgery will be performed.  7. Thoroughly rinse your body with warm water from the neck down.  8. DO NOT shower/wash with your normal soap after using and rinsing off the CHG  Soap.  9. Pat yourself dry with a CLEAN TOWEL.  10. Wear CLEAN PAJAMAS to bed the night before surgery  11. Place CLEAN SHEETS on your bed the night of your first shower and DO NOT SLEEP WITH PETS.   Day of Surgery: Wear Clean/Comfortable clothing the morning of surgery Do not apply any deodorants/lotions.   Remember to brush your teeth WITH YOUR REGULAR TOOTHPASTE.   Please read over the following fact sheets that you were given.

## 2020-03-10 ENCOUNTER — Telehealth: Payer: Self-pay | Admitting: *Deleted

## 2020-03-10 ENCOUNTER — Telehealth: Payer: Self-pay

## 2020-03-10 DIAGNOSIS — N184 Chronic kidney disease, stage 4 (severe): Secondary | ICD-10-CM | POA: Diagnosis not present

## 2020-03-10 DIAGNOSIS — E1122 Type 2 diabetes mellitus with diabetic chronic kidney disease: Secondary | ICD-10-CM | POA: Diagnosis not present

## 2020-03-10 DIAGNOSIS — Z466 Encounter for fitting and adjustment of urinary device: Secondary | ICD-10-CM | POA: Diagnosis not present

## 2020-03-10 DIAGNOSIS — I129 Hypertensive chronic kidney disease with stage 1 through stage 4 chronic kidney disease, or unspecified chronic kidney disease: Secondary | ICD-10-CM | POA: Diagnosis not present

## 2020-03-10 DIAGNOSIS — M1612 Unilateral primary osteoarthritis, left hip: Secondary | ICD-10-CM | POA: Diagnosis not present

## 2020-03-10 DIAGNOSIS — N319 Neuromuscular dysfunction of bladder, unspecified: Secondary | ICD-10-CM | POA: Diagnosis not present

## 2020-03-10 NOTE — Telephone Encounter (Signed)
[facility] and left a detailed voice message to let them know that Dr. Regis Bill stated that they need to get cardiac clearance from patients cardiology doctor.

## 2020-03-10 NOTE — Telephone Encounter (Signed)
   [facility] Pre-operative Risk Assessment    HEARTCARE STAFF: - Please ensure there is not already an duplicate clearance open for this procedure. - Under Visit Info/Reason for Call, type in Other and utilize the format Clearance MM/DD/YY or Clearance TBD. Do not use dashes or single digits. - If request is for dental extraction, please clarify the # of teeth to be extracted.  Request for surgical clearance:  1. What type of surgery is being performed? Lumbar fusion   2. When is this surgery scheduled? 03/15/20   3. What type of clearance is required (medical clearance vs. Pharmacy clearance to hold med vs. Both)? medical  4. Are there any medications that need to be held prior to surgery and how long?none   5. Practice name and name of physician performing surgery? Dr Dellis Filbert jenkins   6. What is the office phone number? [place]   What is the office fax number? [phone]  8.   Anesthesia type (None, local, MAC, general) ? general   Fredia Beets 03/10/2020, 5:27 PM  _________________________________________________________________   (provider comments below)

## 2020-03-11 ENCOUNTER — Other Ambulatory Visit (HOSPITAL_COMMUNITY): Admission: RE | Admit: 2020-03-11 | Payer: Medicare Other | Source: Ambulatory Visit

## 2020-03-14 ENCOUNTER — Telehealth: Payer: Self-pay | Admitting: General Practice

## 2020-03-14 ENCOUNTER — Ambulatory Visit: Payer: Self-pay | Admitting: *Deleted

## 2020-03-14 NOTE — Telephone Encounter (Signed)
Call placed to pt regarding an appt, was verbally ok'd to call [name], pt's daughter.  She advised me that surgery has been postponed til 04/24/20.  She has been scheduled Dr. Johney Frame 03/30/20 10:40.  Will route back to the requesting surgeon's office to make them aware that clearance will be addressed at that appointment.

## 2020-03-14 NOTE — Telephone Encounter (Addendum)
Return call to patient to inquire about previous conversation. Patient stated that she spoke with a Tiera or someone with a T name about her upcoming back surgery and was told that she need to be scheduled for an appointment and/or a test before she can have the back surgery. I informed patient that I do not recall speaking with her previously and that it may have been Hilda Blades the nurse for Dr. Stanford Breed or Doren Custard to get her scheduled for her test. I told patient that I would speak with both and have either of them to give her a call back if it were either of them. She thanked me for calling her back and asked that I please have someone to call her back.

## 2020-03-14 NOTE — Telephone Encounter (Signed)
[name] is calling requesting to speak with [name] in regards to their previous conversation. Please advise.

## 2020-03-14 NOTE — Telephone Encounter (Signed)
   Primary Cardiologist: Dr Stanford Breed  Chart reviewed as part of pre-operative protocol coverage. Patient was contacted 03/14/2020 in reference to pre-operative risk assessment for pending surgery as outlined below.  [name] was last seen on 02/28/2012 by Dr Stanford Breed and not seen by [facility] cardiology since.    She will need an office visit prior to clearance.  If her surgery is urgent (scheduled for 03/15/2020) would consider obtaining pre op clearance from her PCP.   Pre-op covering staff: - Please schedule appointment and call patient to inform them. If patient already had an upcoming appointment within acceptable timeframe, please add "pre-op clearance" to the appointment notes so provider is aware. - Please contact requesting surgeon's office via preferred method (i.e, phone, fax) to inform them of need for appointment prior to surgery.  Kerin Ransom, PA-C 03/14/2020, 11:12 AM

## 2020-03-15 DIAGNOSIS — Z466 Encounter for fitting and adjustment of urinary device: Secondary | ICD-10-CM | POA: Diagnosis not present

## 2020-03-15 DIAGNOSIS — N184 Chronic kidney disease, stage 4 (severe): Secondary | ICD-10-CM | POA: Diagnosis not present

## 2020-03-15 DIAGNOSIS — M1612 Unilateral primary osteoarthritis, left hip: Secondary | ICD-10-CM | POA: Diagnosis not present

## 2020-03-15 DIAGNOSIS — N319 Neuromuscular dysfunction of bladder, unspecified: Secondary | ICD-10-CM | POA: Diagnosis not present

## 2020-03-15 DIAGNOSIS — E1122 Type 2 diabetes mellitus with diabetic chronic kidney disease: Secondary | ICD-10-CM | POA: Diagnosis not present

## 2020-03-15 DIAGNOSIS — I129 Hypertensive chronic kidney disease with stage 1 through stage 4 chronic kidney disease, or unspecified chronic kidney disease: Secondary | ICD-10-CM | POA: Diagnosis not present

## 2020-03-16 ENCOUNTER — Telehealth: Payer: Self-pay

## 2020-03-16 NOTE — Telephone Encounter (Signed)
   [facility] Pre-operative Risk Assessment    HEARTCARE STAFF: - Please ensure there is not already an duplicate clearance open for this procedure. - Under Visit Info/Reason for Call, type in Other and utilize the format Clearance MM/DD/YY or Clearance TBD. Do not use dashes or single digits. - If request is for dental extraction, please clarify the # of teeth to be extracted.  Request for surgical clearance:  1. What type of surgery is being performed? L2-3, L3-4, AND L-4-5 DECOMPRESSIVE LAMINECTOMY, INSTRUMENTATION WITH PEDICLE SCREWS AND RODS, PLACEMENT OF INTERBODY PROSTHESIS, AND POSTERIOR LATERAL ARTHRODESIS.    2. When is this surgery scheduled? TBD   3. What type of clearance is required (medical clearance vs. Pharmacy clearance to hold med vs. Both)? BOTH  4. Are there any medications that need to be held prior to surgery and how long? ANTI COAG AND ANTIPLATELET IF NECESSARY  5. Practice name and name of physician performing surgery? [facility]   6. What is the office phone number? [phone]   7.   What is the office fax number? [phone]  8.   Anesthesia type (None, local, MAC, general) ? GENERAL   [name] 03/16/2020, 4:08 PM  _________________________________________________________________   (provider comments below)

## 2020-03-17 ENCOUNTER — Other Ambulatory Visit: Payer: Self-pay | Admitting: *Deleted

## 2020-03-17 NOTE — Patient Outreach (Signed)
[facility]) Care Management  03/17/2020  [name] July 16, 1929 [id number]   Daughter agreed to follow up assessment of needs following back surgery which was scheduled for 9/8.  Noted per chart that procedure rescheduled for 10/18.  Call placed to daughter to follow up on needs, no answer, HIPAA compliant voice message left.  Will follow up within the next 3-4 business days.  Valente David, [place], MSN [place] [phone]

## 2020-03-20 NOTE — Telephone Encounter (Signed)
Patient is scheduled to see Dr. Johney Frame for cardiac clearance later this month. Will defer to MD for final clearance. Requesting provider's fax number is listed here, MD will need to fax the office note with clearance back to the requesting provider once she is seen.   I will remove this clearance from the preop pool.

## 2020-03-22 ENCOUNTER — Other Ambulatory Visit: Payer: Self-pay | Admitting: *Deleted

## 2020-03-22 NOTE — Patient Outreach (Signed)
[facility]) Care Management  03/22/2020  [name] [birth year] [id number]   Outreach attempt #2, unsuccessful.  Daughter agreed to follow up assessment of needs following back surgery which was scheduled for 9/8.  Noted per chart that procedure rescheduled for 10/18.  Call placed to daughter to follow up on needs, no answer, HIPAA compliant voice message left.  Will send unsuccessful outreach letter and follow up within the next 3-4 business days.  Valente David, [place], MSN [place] [phone]

## 2020-03-24 NOTE — Progress Notes (Addendum)
Cardiology Office Note:    Date:  03/30/2020   ID:  [name], DOB 04-15-1930, MRN [id number]  PCP:  Burnis Medin, MD  [place] Cardiologist:  No primary care provider on file.  [facility] Electrophysiologist:  None   Referring MD: Burnis Medin, MD    History of Present Illness:    [name] is a 86 y.o. female with a hx of DMII, HLD, CKD who was reffered by Dr. Regis Bill for pre-operative evaluation prior to planned L2-L5 decompressive laminectomy.  Patient states that she has had horrible back pain for the past several months. Currently planned for L2-L5 decompressive laminectomy at the end of the month. She denies any chest pain or dyspnea, however, exercise is very limited due to back pain. No personal history of CAD. Has well controlled HTN and DMII. Of note, she has L>R LE edema that has been present since August. LE ultrasound in 02/2020 negative for DVT but swelling has persisted and worsened slightly. Has been taking lasix $RemoveBefo'40mg'dZHYozegaSs$  daily without significant relief. Notes that leg elevation has not helped much.    Last MIBI 2013 with EF 86%, no ischemia. ECG 12/2019: Sinus tachycardia, RBBB, LAFB.   Past Medical History:  Diagnosis Date  . Anemia   . Arthritis   . Bilateral edema of lower extremity   . 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 Chronic lower back pain   . CKD (chronic kidney disease) stage 3, GFR 30-59 ml/min   . DDD (degenerative disc disease)    lumbar  . Diverticulosis   . Family history of breast cancer   . Family history of pancreatic cancer   . Family history of prostate cancer   . Flaccid neuropathic bladder, not elsewhere classified   . Foley catheter in place   . GERD (gastroesophageal reflux disease)   . Hemorrhoids    . Hiatal hernia   . History of colon polyps   . History of radiation therapy 09-12-2015 to 10-12-2015   42.72 gray in 16 fractions directed right breast w/ boost of 12 gray in 6 fractions directed at the lumpectomy cavity- Total dose: 54.72y  . History of recurrent UTIs   . Hyperlipidemia   . PONV (postoperative nausea and vomiting)   . RBBB (right bundle branch block with left anterior fascicular block)   . Type 2 diabetes mellitus ([place])   . Urinary retention with incomplete bladder emptying   . Wears dentures    full upper and lower partial  . Wears glasses   . Wears hearing aid    bilateral but does not wear    Past Surgical History:  Procedure Laterality Date  . BREAST LUMPECTOMY WITH RADIOACTIVE SEED LOCALIZATION Right 08/03/2015   Procedure: BREAST LUMPECTOMY WITH RADIOACTIVE SEED LOCALIZATION;  Surgeon: [name], MD;  Location: [place];  Service: General;  Laterality: Right;  . CARDIOVASCULAR STRESS TEST  01/14/2012   Low risk nuclear study w/ small mild apical and apical lateral reversible perfusion defect represents a small area of ischemia versus shifting breast artifact/  normal LV funciton and wall motion, ef 86%  . CARPAL TUNNEL RELEASE Right 1977  . CATARACT EXTRACTION W/ INTRAOCULAR LENS  IMPLANT, BILATERAL Bilateral left 1996/  right 1997  . CLOSED RIGHT  KNEE MANIPULATION  08/11/2002   post TKA  . CYSTOSTOMY N/A 05/17/2016   Procedure: CYSTOSCOPY WITH  SUPRAPUBIC PLACMENT;  Surgeon: [name], MD;  Location: [facility];  Service: Urology;  Laterality: N/A;  . GANGLION CYST EXCISION Right 12/09/2001   right palm  . KNEE ARTHROSCOPY Right 12/14/2002   w/ Lysis Adhesions  . LUMBAR LAMINECTOMY/DECOMPRESSION MICRODISCECTOMY N/A 03/06/2016   Procedure: CENTRAL DECOMPRESSION L3 - L4 ,L4 - L5;  Surgeon: [name], MD;  Location: [facility];  Service: Orthopedics;  Laterality: N/A;  . SHOULDER HEMI-ARTHROPLASTY Right 02/20/2009    avascular necrosis   . TOTAL KNEE ARTHROPLASTY Bilateral right 06-23-2002/  left 07-11-2008  . [place]  . VAGINAL HYSTERECTOMY  1975    Current Medications: Current Meds  Medication Sig  . acetaminophen (TYLENOL) 500 MG tablet Take 500 mg by mouth every 6 (six) hours as needed for mild pain or moderate pain.   Marland Kitchen aspirin EC 81 MG tablet Take 81 mg by mouth daily at 12 noon.   Marland Kitchen atorvastatin (LIPITOR) 10 MG tablet Take 1 tablet (10 mg total) by mouth daily.  . bisacodyl (DULCOLAX) 10 MG suppository Place 1 suppository (10 mg total) rectally daily as needed for moderate constipation.  . Cholecalciferol (VITAMIN D) 50 MCG (2000 UT) tablet Take 2,000 Units by mouth daily.  . cyanocobalamin 1000 MCG tablet Take 1,000 mcg by mouth at bedtime.   Marland Kitchen esomeprazole (NEXIUM) 40 MG capsule TAKE 1 CAPSULE(40 MG) BY MOUTH DAILY  . furosemide (LASIX) 40 MG tablet Take 1 tablet (40 mg total) by mouth daily. Or as directed  . gabapentin (NEURONTIN) 300 MG capsule Take 1 capsule (300 mg total) by mouth at bedtime.  Marland Kitchen glipiZIDE (GLUCOTROL XL) 5 MG 24 hr tablet Take 1 tablet (5 mg total) by mouth every evening.  Marland Kitchen glipiZIDE (GLUCOTROL) 10 MG tablet Take 1 tablet (10 mg total) by mouth in the morning.  . Multiple Vitamins-Minerals (PRESERVISION AREDS PO) Take 1 capsule by mouth in the morning and at bedtime.  . ondansetron (ZOFRAN-ODT) 4 MG disintegrating tablet Take 1 tablet (4 mg total) by mouth every 8 (eight) hours as needed for nausea or vomiting.  Marland Kitchen oxyCODONE-acetaminophen (PERCOCET) 10-325 MG tablet Take 1 tablet by mouth in the morning and at bedtime.   . polyethylene glycol (MIRALAX / GLYCOLAX) 17 g packet Take 17 g by mouth daily as needed for moderate constipation or severe constipation.  . Potassium 99 MG TABS Take 1 tablet (99 mg total) by mouth daily.  . traMADol (ULTRAM) 50 MG tablet Take 50 mg by mouth 2 (two) times daily.     Allergies:   Phenergan [promethazine]    Social History   Socioeconomic History  . Marital status: Married    Spouse name: Not on file  . Number of children: 2  . Years of education: Not on file  . Highest education level: Not on file  Occupational History  . Occupation: Retired  Tobacco Use  . Smoking status: Never Smoker  . Smokeless tobacco: Never Used  Vaping Use  . Vaping Use: Never used  Substance and Sexual Activity  . Alcohol use: No  . Drug use: No  . Sexual activity: Never  Other Topics Concern  . Not on file  Social History Narrative   HH 2 married   Works for [employer] for 40 years retired   No pets    Lives w spouse; married in 45; 38 years  Social Determinants of Health   Financial Resource Strain:   . Difficulty of Paying Living Expenses: Not on file  Food Insecurity:   . Worried About Charity fundraiser in the Last Year: Not on file  . Ran Out of Food in the Last Year: Not on file  Transportation Needs:   . Lack of Transportation (Medical): Not on file  . Lack of Transportation (Non-Medical): Not on file  Physical Activity:   . Days of Exercise per Week: Not on file  . Minutes of Exercise per Session: Not on file  Stress:   . Feeling of Stress : Not on file  Social Connections:   . Frequency of Communication with Friends and Family: Not on file  . Frequency of Social Gatherings with Friends and Family: Not on file  . Attends Religious Services: Not on file  . Active Member of Clubs or Organizations: Not on file  . Attends Archivist Meetings: Not on file  . Marital Status: Not on file     Family History: The patient's family history includes Coronary artery disease in her brother; Diabetes in her brother, daughter, father, mother, sister, and son; Heart attack in her brother; Heart disease in her father; Pancreatic cancer (age of onset: 42) in her mother; Prostate cancer in her brother. There is no history of Colon cancer or Stomach cancer.  ROS:   Please see the  history of present illness.    The patient denies chest pain, chest pressure, dyspnea at rest or with exertion, palpitations, PND, orthopnea. Denies cough, fever, chills. Denies nausea, vomiting. Denies syncope or presyncope. Denies dizziness or lightheadedness. Denies snoring.  EKGs/Labs/Other Studies Reviewed:    The following studies were reviewed today:  MIBI 2013: EF 86%, no ischemia.  EKG:  EKG ordered today.  The ekg ordered today demonstrates  is SR, RBBB, LAD HR 96   Recent Labs: 08/31/2019: ALT 11 03/09/2020: BUN 20; Creatinine, Ser 1.49; Hemoglobin 10.7; Platelets 250; Potassium 4.0; Sodium 134  Recent Lipid Panel    Component Value Date/Time   CHOL 180 08/31/2019 1052   TRIG 323.0 (H) 08/31/2019 1052   HDL 35.80 (L) 08/31/2019 1052   CHOLHDL 5 08/31/2019 1052   VLDL 64.6 (H) 08/31/2019 1052   LDLCALC 83 12/16/2013 1112   LDLDIRECT 81.0 08/31/2019 1052    Physical Exam:    VS:  BP 114/60   Pulse 96   Ht _0  (1.575 m)   Wt 150 lb (68 kg)   SpO2 94%   BMI 27.44 kg/m     Wt Readings from Last 3 Encounters:  03/30/20 150 lb (68 kg)  03/09/20 154 lb 6.4 oz (70 kg)  02/16/20 146 lb 3.2 oz (66.3 kg)     GEN:  Comfortable elderly female in NAD HEENT: Normal NECK: No JVD; No carotid bruits LYMPHATICS: No lymphadenopathy CARDIAC: RRR, no murmurs, rubs, gallops RESPIRATORY:  Clear to auscultation without rales, wheezing or rhonchi  ABDOMEN: Soft, non-tender, non-distended MUSCULOSKELETAL: 2+ pitting edema in the LLE up to the shin. 1+ RLE. Warm. 1+ pulses.  SKIN: Warm and dry NEUROLOGIC:  Alert and oriented x 3 PSYCHIATRIC:  Normal affect   ASSESSMENT:    1. Preoperative cardiovascular examination   2. DOE (dyspnea on exertion)   3. Lower extremity edema   4. Chronic midline low back pain with bilateral sciatica   5. Pure hypercholesterolemia    PLAN:    In order of problems listed above:  1. Cardiac  Preoperative Evaluation for L2-L5 Decompressive  Laminectomy: Patient limited due to back pain and is unable to stand for more than 36mn. Unclear functional status. No history of CAD. No chest pain, shortness of breath, palpitations. Blood pressure well controlled. Diabetes well controlled.  -Plan for MIBI for risk stratification given unknown functional status -Check TTE as patient taking lasix to control volume status with current bilateral LE edema and no TTE in our system -Check pro-BNP   2. Asymmetric LLE swelling: Ultrasound in 02/2020 negative for DVT but edema has persisted and worsened.  -Will check D-dimer to ensure no clot developed in the interim -If negative, will increase lasix 462mqAM and 2058mPM for 3 days and then 69m71m daily thereafter (home dose) -Check TTE and pro-BNP as above  3. Hypertension: -Well controlled not on medications currently  4.  Hyperlipidemia: -Continue atorvastatin 10mg44m DMII: -Managed by PCP, on glipizide   Medication Adjustments/Labs and Tests Ordered: Current medicines are reviewed at length with the patient today.  Concerns regarding medicines are outlined above.  Orders Placed This Encounter  Procedures  . D-Dimer, Quantitative  . Pro b natriuretic peptide (BNP)  . Myocardial Perfusion Imaging  . EKG 12-Lead  . ECHOCARDIOGRAM COMPLETE   No orders of the defined types were placed in this encounter.   Patient Instructions  Medication Instructions:  Your physician recommends that you continue on your current medications as directed. Please refer to the Current Medication list given to you today.  If your lab work comes back normal, please increase your lasix. Take 69mg 14mab) in the AM and 20mg (50mtab) in the PM for 3 days. Then begin your normal 69mg, o94mablet dose, per day  Labwork: You will have labs drawn today: D dimer and BMP   Testing/Procedures: Your physician has requested that you have an echocardiogram. Echocardiography is a painless test that uses sound  waves to create images of your heart. It provides your doctor with information about the size and shape of your heart and how well your heart's chambers and valves are working. This procedure takes approximately one hour. There are no restrictions for this procedure.  Your physician has requested that you have nuclear stress myoview. For further information please visit [email or web address] follow instruction sheet, as given.  Follow-Up: Your physician recommends that you schedule a follow-up appointment in:   As needed with Dr. PembertoJohney Frameher Special Instructions Will Be Listed Below (If Applicable).     If you need a refill on your cardiac medications before your next appointment, please call your pharmacy.      Signed, Ayshia Gramlich Freada Bergeron23/2021 12:18 PM    [facility]----------  Pre-operative clearance:  Patient is medically optimized from a cardiovascular standpoint to undergo L2-L5 decompressive laminectomy. Her nuclear stress test is negative for ischemia. TTE with normal biventricular function and no significant valvular disease. LE doppler ultrasound was negative for DVT. No further intervention needed from a cardiovascular standpoint prior to her procedure. Okay to hold ASA 81mg pri59mo the procedure if needed and resume post-operatively.  Roneshia Drew PGwyndolyn Kaufman

## 2020-03-27 ENCOUNTER — Other Ambulatory Visit: Payer: Self-pay

## 2020-03-27 ENCOUNTER — Telehealth: Payer: Self-pay | Admitting: Internal Medicine

## 2020-03-27 ENCOUNTER — Telehealth: Payer: Self-pay | Admitting: Cardiology

## 2020-03-27 DIAGNOSIS — E119 Type 2 diabetes mellitus without complications: Secondary | ICD-10-CM

## 2020-03-27 MED ORDER — GLIPIZIDE 10 MG PO TABS: 10.0000 mg | ORAL_TABLET | Freq: Every morning | ORAL | 3 refills | Status: DC

## 2020-03-27 NOTE — Telephone Encounter (Signed)
Patients daughter requesting to come to her mothers appointment. I let her know that she could have one visitor. Patients daughter thanked me for the call and will plan to assist her mother at upcoming 9/23 appointment.

## 2020-03-27 NOTE — Telephone Encounter (Signed)
Ok to refill x 3  Previous was the rehab peple but [place] before then

## 2020-03-27 NOTE — Telephone Encounter (Signed)
Last OV 02/16/2020  This Rx was last filled by [place], Nelda Bucks, NP  Are you filling this or another office? Please advise.

## 2020-03-27 NOTE — Telephone Encounter (Signed)
Called patient and LMOVM to return call  Left a detailed voice message to let patient know that the refill has been sent to the requested pharmacy for patient.

## 2020-03-27 NOTE — Telephone Encounter (Signed)
Pt is wanting a prescription of   glipiZIDE (GLUCOTROL) 10 MG tablet   Called into [facility] [place]  [address] [place] [address]   She wants a 90 day supply.  Please advise

## 2020-03-27 NOTE — Telephone Encounter (Signed)
New message:     Patient daughter calling back to ask some questions concerning her mother and also states she has to come with her. Please call back.

## 2020-03-28 ENCOUNTER — Other Ambulatory Visit: Payer: Self-pay | Admitting: *Deleted

## 2020-03-28 NOTE — Patient Outreach (Signed)
[facility]) Care Management  03/28/2020  HASET OAXACA 23-Sep-1929 [id number]   Outreach attempt #3, unsuccessful.  Daughter agreed to follow up assessment of needs following back surgery which was scheduled for 9/8. Noted per chart that procedure rescheduled for 10/18. Call placed to daughter to follow up on needs, no answer, HIPAA compliant voice message left.  Will follow up with 4th and final attempt within the next 4 weeks.  If remain unsuccessful, will close case at that time due to inability to maintain contact.  Valente David, [place], MSN [employer] [phone]

## 2020-03-29 DIAGNOSIS — N184 Chronic kidney disease, stage 4 (severe): Secondary | ICD-10-CM | POA: Diagnosis not present

## 2020-03-29 DIAGNOSIS — I129 Hypertensive chronic kidney disease with stage 1 through stage 4 chronic kidney disease, or unspecified chronic kidney disease: Secondary | ICD-10-CM | POA: Diagnosis not present

## 2020-03-29 DIAGNOSIS — M1612 Unilateral primary osteoarthritis, left hip: Secondary | ICD-10-CM | POA: Diagnosis not present

## 2020-03-29 DIAGNOSIS — Z466 Encounter for fitting and adjustment of urinary device: Secondary | ICD-10-CM | POA: Diagnosis not present

## 2020-03-29 DIAGNOSIS — E1122 Type 2 diabetes mellitus with diabetic chronic kidney disease: Secondary | ICD-10-CM | POA: Diagnosis not present

## 2020-03-29 DIAGNOSIS — N319 Neuromuscular dysfunction of bladder, unspecified: Secondary | ICD-10-CM | POA: Diagnosis not present

## 2020-03-30 ENCOUNTER — Ambulatory Visit (INDEPENDENT_AMBULATORY_CARE_PROVIDER_SITE_OTHER): Payer: Medicare Other | Admitting: Cardiology

## 2020-03-30 ENCOUNTER — Telehealth: Payer: Self-pay

## 2020-03-30 ENCOUNTER — Encounter: Payer: Self-pay | Admitting: Cardiology

## 2020-03-30 ENCOUNTER — Other Ambulatory Visit: Payer: Self-pay

## 2020-03-30 VITALS — BP 114/60 | HR 96 | Ht 62.0 in | Wt 150.0 lb

## 2020-03-30 DIAGNOSIS — R0609 Other forms of dyspnea: Secondary | ICD-10-CM

## 2020-03-30 DIAGNOSIS — Z01810 Encounter for preprocedural cardiovascular examination: Secondary | ICD-10-CM

## 2020-03-30 DIAGNOSIS — M5442 Lumbago with sciatica, left side: Secondary | ICD-10-CM

## 2020-03-30 DIAGNOSIS — G8929 Other chronic pain: Secondary | ICD-10-CM

## 2020-03-30 DIAGNOSIS — M5441 Lumbago with sciatica, right side: Secondary | ICD-10-CM

## 2020-03-30 DIAGNOSIS — R0600 Dyspnea, unspecified: Secondary | ICD-10-CM | POA: Diagnosis not present

## 2020-03-30 DIAGNOSIS — E7800 Pure hypercholesterolemia, unspecified: Secondary | ICD-10-CM | POA: Diagnosis not present

## 2020-03-30 DIAGNOSIS — R600 Localized edema: Secondary | ICD-10-CM

## 2020-03-30 LAB — D-DIMER, QUANTITATIVE: D-DIMER: 1.84 mg/L FEU — ABNORMAL HIGH (ref 0.00–0.49)

## 2020-03-30 NOTE — Progress Notes (Signed)
D-dimer mildly elevated. Given persistent LLE swelling and overall progression since last LE ultrasound, will repeat to ensure no thrombus is present before patient goes to surgery. The patient's daughter was called with the results by nursing and planned for OP LLE ultrasound tomorrow AM. Will follow-up results.

## 2020-03-30 NOTE — Addendum Note (Signed)
Addended by: Dollene Primrose on: 03/30/2020 03:15 PM   Modules accepted: Orders

## 2020-03-30 NOTE — Patient Instructions (Addendum)
Medication Instructions:  Your physician recommends that you continue on your current medications as directed. Please refer to the Current Medication list given to you today.  If your lab work comes back normal, please increase your lasix. Take 40mg  (1 tab) in the AM and 20mg  (1/2 tab) in the PM for 3 days. Then begin your normal 40mg , one tablet dose, per day  Labwork: You will have labs drawn today: D dimer and BMP   Testing/Procedures: Your physician has requested that you have an echocardiogram. Echocardiography is a painless test that uses sound waves to create images of your heart. It provides your doctor with information about the size and shape of your heart and how well your heart's chambers and valves are working. This procedure takes approximately one hour. There are no restrictions for this procedure.  Your physician has requested that you have nuclear stress myoview. For further information please visit [email or web address]. Please follow instruction sheet, as given.  Follow-Up: Your physician recommends that you schedule a follow-up appointment in:   As needed with Dr. Johney Frame  Any Other Special Instructions Will Be Listed Below (If Applicable).     If you need a refill on your cardiac medications before your next appointment, please call your pharmacy.

## 2020-03-30 NOTE — Telephone Encounter (Signed)
Dr. Johney Frame aware of elevated D Dimer. Pt has agreed to doppler study tomorrow at [facility]. Order was placed under 9/23 office visit.

## 2020-03-31 ENCOUNTER — Ambulatory Visit (HOSPITAL_COMMUNITY)
Admission: RE | Admit: 2020-03-31 | Discharge: 2020-03-31 | Disposition: A | Discharge status: Home / Self Care | Payer: Medicare Other | Source: Ambulatory Visit | Attending: Cardiology | Admitting: Cardiology

## 2020-03-31 ENCOUNTER — Telehealth: Payer: Self-pay

## 2020-03-31 DIAGNOSIS — R600 Localized edema: Secondary | ICD-10-CM | POA: Insufficient documentation

## 2020-03-31 DIAGNOSIS — R52 Pain, unspecified: Secondary | ICD-10-CM | POA: Diagnosis not present

## 2020-03-31 LAB — PRO B NATRIURETIC PEPTIDE: NT-Pro BNP: 491 pg/mL (ref 0–738)

## 2020-03-31 NOTE — Progress Notes (Signed)
Left lower extremity venous duplex completed. Refer to "CV Proc" under chart review to view preliminary results.  03/31/2020 10:36 AM Kelby Aline., MHA, RVT, RDCS, RDMS

## 2020-03-31 NOTE — Telephone Encounter (Signed)
[facility]-invasive lab called with a preliminary report.... pt left leg was negative for DVT and she will let the patient go home. Will forward to Dr. Johney Frame.   Preliminary available under her report.

## 2020-03-31 NOTE — Plan of Care (Signed)
Called and spoke to the patient's daughter about negative left lower extremity doppler ultrasound as well as normal pro-BNP. Instructed her to continue to take her lasix 40mg  in the AM and add 20mg  of lasix in the afternoon for the next 3 days. If symptoms improving, but not back to baseline, can extend to 5days. If no improvement, will call clinic and discuss diuretic regimen further. Still awaiting TTE and myoview results.  Gwyndolyn Kaufman, MD

## 2020-04-10 ENCOUNTER — Telehealth (HOSPITAL_COMMUNITY): Payer: Self-pay

## 2020-04-10 ENCOUNTER — Telehealth: Payer: Self-pay

## 2020-04-10 DIAGNOSIS — N312 Flaccid neuropathic bladder, not elsewhere classified: Secondary | ICD-10-CM | POA: Diagnosis not present

## 2020-04-10 NOTE — Telephone Encounter (Signed)
Spoke with the patient, detailed instructions given. She stated that she understood and would be here for her test. Daughter is to come assist with the patient. S.Margarita Croke EMTP

## 2020-04-10 NOTE — Telephone Encounter (Signed)
Patient is calling in wondering if she were to receive the flu vaccine on Wednesday if it would interfere with her surgery on the 18th.

## 2020-04-10 NOTE — Telephone Encounter (Signed)
Should be fine but sending to you for verification. Please advise.

## 2020-04-11 ENCOUNTER — Encounter: Payer: Self-pay | Admitting: Internal Medicine

## 2020-04-11 NOTE — Telephone Encounter (Signed)
Called patient and spoke to daughter [name] and gave her the message. [name] stated that the patients husband is coming with them to her visit tomorrow and would like his flu vaccine also. I advised for husband to let us know so we can open a spot on the Nurse visit schedule to have his flu vaccine given at the same time as patient appointment with Dr. Regis Bill. Daughter verbalized an understanding.

## 2020-04-11 NOTE — Telephone Encounter (Signed)
So   There is a statement for support for  Hospital bed   Please  Fax  Or otherwise get to them as I don't have a form just   A request for information

## 2020-04-11 NOTE — Progress Notes (Signed)
Chief Complaint  Patient presents with  . Follow-up    Doing okay    HPI: [name] 86 y.o. come in for Chronic disease management follow-up of medical conditions and medicines. In regard to her spinal stenosis and compression fracture is she is to have surgery on October 18 by Dr. Arnoldo Morale. She is under cardiovascular evaluation for her edema and preoperative clearance.  She is to have a stress test tomorrow.  Diuretics do not appear to affect her lower extremity edema and there was no DVT when evaluated. Blood sugars have been pretty good this morning was 97.  No lows. She is taking pain pills in the morning in the evening occasionally Tylenol in the middle. New concern is that her hair is thinning and coming out a lot has questions about that. Needs flu shot today. ROS: See pertinent positives and negatives per HPI.  UTI problem better  Past Medical History:  Diagnosis Date  . Anemia   . Arthritis   . Bilateral edema of lower extremity   . 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 Chronic lower back pain   . CKD (chronic kidney disease) stage 3, GFR 30-59 ml/min (HCC)   . DDD (degenerative disc disease)    lumbar  . Diverticulosis   . Family history of breast cancer   . Family history of pancreatic cancer   . Family history of prostate cancer   . Flaccid neuropathic bladder, not elsewhere classified   . Foley catheter in place   . GERD (gastroesophageal reflux disease)   . Hemorrhoids   . Hiatal hernia   . History of colon polyps   . History of radiation therapy 09-12-2015 to 10-12-2015   42.72 gray in 16 fractions directed right breast w/ boost of 12 gray in 6 fractions directed at the lumpectomy cavity- Total dose: 54.72y  . History of recurrent UTIs   .  Hyperlipidemia   . PONV (postoperative nausea and vomiting)   . RBBB (right bundle branch block with left anterior fascicular block)   . Type 2 diabetes mellitus ([place])   . Urinary retention with incomplete bladder emptying   . Wears dentures    full upper and lower partial  . Wears glasses   . Wears hearing aid    bilateral but does not wear    Family History  Problem Relation Age of Onset  . Pancreatic cancer Mother 56  . Diabetes Mother   . Prostate cancer Brother   . Diabetes Father   . Heart disease Father   . Diabetes Brother   . Heart attack Brother   . Diabetes Sister   . Diabetes Daughter   . Diabetes Son   . Coronary artery disease Brother        MI in his 69s  . Colon cancer Neg Hx   . Stomach cancer Neg Hx     Social History   Socioeconomic History  . Marital status: Married    Spouse name: Not on file  . Number of children: 2  . Years of education: Not on file  . Highest education level: Not on file  Occupational History  . Occupation: Retired  Tobacco Use  . Smoking status: Never Smoker  . Smokeless tobacco: Never Used  Vaping Use  .  Vaping Use: Never used  Substance and Sexual Activity  . Alcohol use: No  . Drug use: No  . Sexual activity: Never  Other Topics Concern  . Not on file  Social History Narrative   HH 2 married   Works for [employer] for 40 years retired   No pets    Lives w spouse; married in 80; 54 years    Social Determinants of Health   Financial Resource Strain:   . Difficulty of Paying Living Expenses: Not on file  Food Insecurity:   . Worried About Charity fundraiser in the Last Year: Not on file  . Ran Out of Food in the Last Year: Not on file  Transportation Needs:   . Lack of Transportation (Medical): Not on file  . Lack of Transportation (Non-Medical): Not on file  Physical Activity:   . Days of Exercise per Week: Not on file  . Minutes of Exercise per Session: Not on file  Stress:   . Feeling of Stress : Not  on file  Social Connections:   . Frequency of Communication with Friends and Family: Not on file  . Frequency of Social Gatherings with Friends and Family: Not on file  . Attends Religious Services: Not on file  . Active Member of Clubs or Organizations: Not on file  . Attends Archivist Meetings: Not on file  . Marital Status: Not on file    Outpatient Medications Prior to Visit  Medication Sig Dispense Refill  . acetaminophen (TYLENOL) 500 MG tablet Take 500 mg by mouth every 6 (six) hours as needed for mild pain or moderate pain.     Marland Kitchen aspirin EC 81 MG tablet Take 81 mg by mouth daily at 12 noon.     Marland Kitchen atorvastatin (LIPITOR) 10 MG tablet Take 1 tablet (10 mg total) by mouth daily. 30 tablet 0  . bisacodyl (DULCOLAX) 10 MG suppository Place 1 suppository (10 mg total) rectally daily as needed for moderate constipation. 12 suppository 0  . Cholecalciferol (VITAMIN D) 50 MCG (2000 UT) tablet Take 2,000 Units by mouth daily.    . cyanocobalamin 1000 MCG tablet Take 1,000 mcg by mouth at bedtime.     Marland Kitchen esomeprazole (NEXIUM) 40 MG capsule TAKE 1 CAPSULE(40 MG) BY MOUTH DAILY 90 capsule 1  . furosemide (LASIX) 40 MG tablet Take 1 tablet (40 mg total) by mouth daily. Or as directed 30 tablet 0  . gabapentin (NEURONTIN) 300 MG capsule Take 1 capsule (300 mg total) by mouth at bedtime. 90 capsule 1  . glipiZIDE (GLUCOTROL XL) 5 MG 24 hr tablet Take 1 tablet (5 mg total) by mouth every evening. 30 tablet 0  . glipiZIDE (GLUCOTROL) 10 MG tablet Take 1 tablet (10 mg total) by mouth in the morning. 90 tablet 3  . Multiple Vitamins-Minerals (PRESERVISION AREDS PO) Take 1 capsule by mouth in the morning and at bedtime.    . ondansetron (ZOFRAN-ODT) 4 MG disintegrating tablet Take 1 tablet (4 mg total) by mouth every 8 (eight) hours as needed for nausea or vomiting. 15 tablet 0  . polyethylene glycol (MIRALAX / GLYCOLAX) 17 g packet Take 17 g by mouth daily as needed for moderate constipation or  severe constipation. 14 each 0  . Potassium 99 MG TABS Take 1 tablet (99 mg total) by mouth daily. 30 tablet 0  . traMADol (ULTRAM) 50 MG tablet Take 50 mg by mouth 2 (two) times daily.    Marland Kitchen oxyCODONE-acetaminophen (PERCOCET)  10-325 MG tablet Take 1 tablet by mouth in the morning and at bedtime.  (Patient not taking: Reported on 04/12/2020)     No facility-administered medications prior to visit.     EXAM:  BP 128/64   Pulse 94   Temp 98.4 F (36.9 C) (Oral)   Ht 5' 2"  (1.575 m)   Wt 150 lb (68 kg)   SpO2 93%   BMI 27.44 kg/m   Body mass index is 27.44 kg/m.  GENERAL: vitals reviewed and listed above, alert, oriented, appears well hydrated and in no acute distress in wheelchair.  Looks alert cognitively intact HEENT: atraumatic, conjunctiva  clear, no obvious abnormalities on inspection of external nose and ears OP : n masked NECK: no obvious masses on inspection palpation  LUNGS: clear to auscultation bilaterally, no wheezes, rales or rhonchi, good air movement CV: HRRR, no clubbing cyanosis or 2+ edema left greater than right no acute redness nl cap refill  MS: moves all extremities  PSYCH: pleasant and cooperative, no obvious depression or anxiety Hair thinning diffuse pattern no alopecia patches and no scalp scarring is obvious. Lab Results  Component Value Date   WBC 6.0 03/09/2020   HGB 10.7 (L) 03/09/2020   HCT 34.5 (L) 03/09/2020   PLT 250 03/09/2020   GLUCOSE 183 (H) 03/09/2020   CHOL 180 08/31/2019   TRIG 323.0 (H) 08/31/2019   HDL 35.80 (L) 08/31/2019   LDLDIRECT 81.0 08/31/2019   LDLCALC 83 12/16/2013   ALT 11 08/31/2019   AST 13 08/31/2019   NA 134 (L) 03/09/2020   K 4.0 03/09/2020   CL 97 (L) 03/09/2020   CREATININE 1.49 (H) 03/09/2020   BUN 20 03/09/2020   CO2 28 03/09/2020   TSH 3.25 08/28/2018   INR 1.0 12/25/2019   HGBA1C 6.6 (H) 03/09/2020   MICROALBUR 10.3 (H) 08/31/2019   BP Readings from Last 3 Encounters:  04/12/20 128/64  03/30/20  114/60  03/09/20 (!) 123/54    ASSESSMENT AND PLAN:  Discussed the following assessment and plan:  Edema, unspecified type - Plan: TSH, T4, free, T3, free, T3, free, T4, free, TSH  Spinal stenosis of lumbar region, unspecified whether neurogenic claudication present  Hair loss - Plan: TSH, T4, free, T3, free, T3, free, T4, free, TSH  Need for influenza vaccination - Plan: Flu Vaccine QUAD High Dose(Fluad)  Controlled type 2 diabetes mellitus with complication, without long-term current use of insulin (HCC)  Mobility impaired  Medication management Hair thinning and shedding possibly from telogen or other causes check thyroid not updated. Expectant management good luck with the surgery her diabetes appears to be controlled she will get flu vaccine today plan follow-up in about 4 months or earlier if needed. She is hopeful that she can return home after the surgery and not have to go to rehab which she did not have a healthy experience. -Patient advised to return or notify health care team  if  new concerns arise.  Patient Instructions  Checking thyroid  Test  To make sure not effecting hair.  Sometimes  Medications  illness and  Age can combine to cause this  .   Good luck with the surgery.   Plan follow up on 4 months or as needed.  Last Hg a1c was  9 2 2021 and  Was 6.6        Reyden Smith K. Hayat Warbington M.D.

## 2020-04-11 NOTE — Progress Notes (Signed)
This addendum is to add requested information as to the requirements for a semielectric hospital bed ordered  for [name].  See previous notes.  She has a compression fracture and spinal stenosis  in her back with significant pain and significant spinal arthritis, is unable to get out of bed without physical assistance assistance from another person who was not available.She needs positioning of the bed to reduce  pain and  pressure. There is no caregiver that can help her get out of bed at this time.  And thus she is unable to perform independent movement. Semielectric hospital bed is medically necessary to help prevent falls and ability to remain in the home for care.  Of note, she is scheduled to have spinal surgery posterior laminectomy lumbar interbody fusion with prosthesis lumbar 2-3 4 and 5 on 04/24/2020.

## 2020-04-11 NOTE — Telephone Encounter (Signed)
Agree  Get the vaccine

## 2020-04-12 ENCOUNTER — Other Ambulatory Visit: Payer: Self-pay

## 2020-04-12 ENCOUNTER — Ambulatory Visit (INDEPENDENT_AMBULATORY_CARE_PROVIDER_SITE_OTHER): Payer: Medicare Other | Admitting: Internal Medicine

## 2020-04-12 ENCOUNTER — Encounter: Payer: Self-pay | Admitting: Internal Medicine

## 2020-04-12 VITALS — BP 128/64 | HR 94 | Temp 98.4°F | Ht 62.0 in | Wt 150.0 lb

## 2020-04-12 DIAGNOSIS — M48061 Spinal stenosis, lumbar region without neurogenic claudication: Secondary | ICD-10-CM

## 2020-04-12 DIAGNOSIS — Z7409 Other reduced mobility: Secondary | ICD-10-CM

## 2020-04-12 DIAGNOSIS — R609 Edema, unspecified: Secondary | ICD-10-CM | POA: Diagnosis not present

## 2020-04-12 DIAGNOSIS — Z79899 Other long term (current) drug therapy: Secondary | ICD-10-CM

## 2020-04-12 DIAGNOSIS — Z23 Encounter for immunization: Secondary | ICD-10-CM | POA: Diagnosis not present

## 2020-04-12 DIAGNOSIS — L659 Nonscarring hair loss, unspecified: Secondary | ICD-10-CM

## 2020-04-12 DIAGNOSIS — E118 Type 2 diabetes mellitus with unspecified complications: Secondary | ICD-10-CM | POA: Diagnosis not present

## 2020-04-12 LAB — T4, FREE: Free T4: 1 ng/dL (ref 0.8–1.8)

## 2020-04-12 LAB — T3, FREE: T3, Free: 2.7 pg/mL (ref 2.3–4.2)

## 2020-04-12 LAB — TSH: TSH: 3.64 mIU/L (ref 0.40–4.50)

## 2020-04-12 NOTE — Patient Instructions (Addendum)
Checking thyroid  Test  To make sure not effecting hair.  Sometimes  Medications  illness and  Age can combine to cause this  .   Good luck with the surgery.   Plan follow up on 4 months or as needed.  Last Hg a1c was  9 2 2021 and  Was 6.6

## 2020-04-13 ENCOUNTER — Ambulatory Visit (HOSPITAL_BASED_OUTPATIENT_CLINIC_OR_DEPARTMENT_OTHER): Payer: Medicare Other

## 2020-04-13 ENCOUNTER — Ambulatory Visit (HOSPITAL_COMMUNITY): Payer: Medicare Other | Attending: Cardiology

## 2020-04-13 DIAGNOSIS — R110 Nausea: Secondary | ICD-10-CM | POA: Insufficient documentation

## 2020-04-13 DIAGNOSIS — R0609 Other forms of dyspnea: Secondary | ICD-10-CM

## 2020-04-13 DIAGNOSIS — R0600 Dyspnea, unspecified: Secondary | ICD-10-CM

## 2020-04-13 LAB — MYOCARDIAL PERFUSION IMAGING
LV dias vol: 40 mL (ref 46–106)
LV sys vol: 8 mL
Peak HR: 109 {beats}/min
Rest HR: 93 {beats}/min
SDS: 3
SRS: 1
SSS: 4
TID: 1.06

## 2020-04-13 LAB — ECHOCARDIOGRAM COMPLETE
AR max vel: 1.61 cm2
AV Area VTI: 1.93 cm2
AV Area mean vel: 1.66 cm2
AV Mean grad: 4 mmHg
AV Peak grad: 6.4 mmHg
Ao pk vel: 1.26 m/s
Area-P 1/2: 10.39 cm2
S' Lateral: 2.3 cm

## 2020-04-13 MED ORDER — AMINOPHYLLINE 25 MG/ML IV SOLN
75.0000 mg | Freq: Once | INTRAVENOUS | Status: AC
  Administered 2020-04-13: 75 mg via INTRAVENOUS

## 2020-04-13 MED ORDER — TECHNETIUM TC 99M TETROFOSMIN IV KIT
31.8000 | PACK | Freq: Once | INTRAVENOUS | Status: AC | PRN
  Administered 2020-04-13: 31.8 via INTRAVENOUS
  Filled 2020-04-13: qty 32

## 2020-04-13 MED ORDER — TECHNETIUM TC 99M TETROFOSMIN IV KIT
11.0000 | PACK | Freq: Once | INTRAVENOUS | Status: AC | PRN
  Administered 2020-04-13: 11 via INTRAVENOUS
  Filled 2020-04-13: qty 11

## 2020-04-13 MED ORDER — REGADENOSON 0.4 MG/5ML IV SOLN
0.4000 mg | Freq: Once | INTRAVENOUS | Status: AC
  Administered 2020-04-13: 0.4 mg via INTRAVENOUS

## 2020-04-13 NOTE — Progress Notes (Signed)
Thyroid tests are normal  do not explain for  hair thinning  or swelling.

## 2020-04-13 NOTE — Progress Notes (Unsigned)
Patient is medically optimized from a cardiovascular standpoint to undergo L2-L5 decompressive laminectomy

## 2020-04-13 NOTE — Telephone Encounter (Signed)
This  Was done already

## 2020-04-18 ENCOUNTER — Telehealth: Payer: Self-pay

## 2020-04-18 NOTE — Progress Notes (Addendum)
Anesthesia Chart Review:  Case: [id number] Date/Time: 04/24/20 0905   Procedure: LAMINECTOMY, POSTERIOR LUMBAR INTERBODY FUSION, INTERBODY PROSTHESIS, LUMBAR 2- LUMBAR 3, LUMBAR 3 - LUMBAR 4, LUMBAR 4- LUMBAR 5 (N/A )   Anesthesia type: General   Pre-op diagnosis: SPONDYLOLISTHESIS, LUMBAR REGION   Location: [place] OR ROOM 19 / [place] OR   Surgeons: Newman Pies, MD      DISCUSSION: Patient is an 86 year old female scheduled for the above procedure. Surgery was initially scheduled for 03/15/20, but was postponed because her PCP Dr. Regis Bill recommended preoperative cardiac evaluation. She has since had a stress test and echocardiogram (see below) and felt "optimized" for surgery from a CV standpoint.   History includes never smoker, post-operative NV, DM2, HLD, GERD, hiatal hernia, BLE edema (L > R), CKD (stage 3), RBBB, anemia, neuropathic bladder (with urinary retention/recurrent UTIs; s/p suprapubic tube placement 05/17/16), right breast cancer (s/p right breast lumpectomy 08/03/15, s/p radiation), chronic back pain, TKA (right 06/23/02, left 07/11/08), right shoulder hemiarthroplasty (02/20/09), back surgery (L3-5 laminectomy 03/07/16).  Per Dr. Johney Frame, "Patient is medically optimized from a cardiovascular standpoint to undergo L2-L5 decompressive laminectomy." Additional input by Coletta Memos, NP regarding lumbar fusion, "Given past medical history and time since last visit, based on ACC/AHA guidelines, [name] would be at acceptable risk for the planned procedure without further cardiovascular testing."   Last ASA 04/18/20. Preoperative COVID-19 test is scheduled for 04/20/20. Anesthesia team will evaluate on the day of surgery.    VS: BP (!) 111/59   Pulse 95   Temp 36.7 C   Resp 18   Ht _0  (1.549 m)   Wt 67 kg   SpO2 96%   BMI 27.89 kg/m    PROVIDERS: Panosh, Standley Brooking, MD is PCP. Last evaluation. 4 month follow-up after surgery planned. Gwyndolyn Kaufman, MD is cardiologist  (had previously seen Kirk Ruths, MD in 2013). Evaluation 03/30/20. Stress and echo ordered as part of preoperative evaluation. Pro-BNP (normal) and LLE Venous US (negative for DVT) also added for asymmetric LLE swelling with extra doses of Lasix added with plans to address further at follow-up.    LABS: Labs reviewed: Acceptable for surgery. Cr trends 1.2-1.49 since 08/2019.  A1c 6.6% on 03/09/20.  (all labs ordered are listed, but only abnormal results are displayed)  Labs Reviewed  CBC - Abnormal; Notable for the following components:      Result Value   Hemoglobin 11.1 (*)    HCT 35.6 (*)    All other components within normal limits  BASIC METABOLIC PANEL - Abnormal; Notable for the following components:   Chloride 97 (*)    Glucose, Bld 130 (*)    Creatinine, Ser 1.43 (*)    GFR, Estimated 33 (*)    All other components within normal limits  GLUCOSE, CAPILLARY - Abnormal; Notable for the following components:   Glucose-Capillary 130 (*)    All other components within normal limits  SURGICAL PCR SCREEN  TYPE AND SCREEN    IMAGES: MRI L-spine films for 09/06/19 can be viewed in [facility]/PACS.     EKG: 03/30/20 ([facility]): SR, RBBB, LAD   CV: Echo 04/13/20: IMPRESSIONS  1. Left ventricular ejection fraction, by estimation, is 60 to 65%. The  left ventricle has normal function. The left ventricle has no regional  wall motion abnormalities. There is mild concentric left ventricular  hypertrophy of the basal-septal segment.  Left ventricular diastolic parameters are consistent with Grade I  diastolic dysfunction (impaired relaxation).  2. Right ventricular systolic function is normal. The right ventricular  size is normal. There is normal pulmonary artery systolic pressure.  3. Average HR 91 bpm.. The mitral valve is abnormal. No evidence of  mitral valve regurgitation. Moderate mitral annular calcification. MV peak  gradient 16.2 mmHg, mean gradient 6.0 mmHg.  4. Left  atrial size was mildly dilated.  5. The aortic valve is tricuspid. There is moderate calcification of the  aortic valve. Aortic valve regurgitation is not visualized. Mild aortic  valve sclerosis is present, with no evidence of aortic valve stenosis.  6. Aortic Normal Ascending Ao.  7. The inferior vena cava is normal in size with greater than 50%  respiratory variability, suggesting right atrial pressure of 3 mmHg.  - Comparison(s): No prior Echocardiogram.  - Conclusion(s)/Recommendation(s): Increase in mitral gradients at heart  rate of 91 bpm; calcified valve. MV peak gradient 16.2 mmHg, mean gradient 6.0 mmHg.    Nuclear stress test 04/13/20:  The left ventricular ejection fraction is hyperdynamic (>65%).  Nuclear stress EF: 79%.  There was no ST segment deviation noted during stress.  There is a subtle defect in the mid to apical inferolateral region that appears to be influenced by bowel loop attenuation artifact. No significant ischemia identified.  This is a low risk study.   LLE venous US 03/31/20: Summary:  LEFT:  - There is no evidence of deep vein thrombosis in the lower extremity.  - No cystic structure found in the popliteal fossa.  - Pulsatile lower extremity venous flow is suggestive of possibly elevated  right heart pressure.     Past Medical History:  Diagnosis Date  . Anemia   . Arthritis   . Bilateral edema of lower extremity   . 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 Chronic lower back pain   . CKD (chronic kidney disease) stage 3, GFR 30-59 ml/min (HCC)   . DDD (degenerative disc disease)    lumbar  . Diverticulosis   . Family history of breast cancer   . Family history of pancreatic cancer   . Family history of prostate cancer   .  Flaccid neuropathic bladder, not elsewhere classified   . Foley catheter in place   . GERD (gastroesophageal reflux disease)   . Hemorrhoids   . Hiatal hernia   . History of colon polyps   . History of radiation therapy 09-12-2015 to 10-12-2015   42.72 gray in 16 fractions directed right breast w/ boost of 12 gray in 6 fractions directed at the lumpectomy cavity- Total dose: 54.74y  . History of recurrent UTIs   . Hyperlipidemia   . PONV (postoperative nausea and vomiting)   . RBBB (right bundle branch block with left anterior fascicular block)   . Type 2 diabetes mellitus ([place])   . Urinary retention with incomplete bladder emptying   . Wears dentures    full upper and lower partial  . Wears glasses   . Wears hearing aid    bilateral but does not wear    Past Surgical History:  Procedure Laterality Date  . BACK SURGERY    . BREAST LUMPECTOMY WITH RADIOACTIVE SEED LOCALIZATION Right 08/03/2015   Procedure: BREAST LUMPECTOMY WITH RADIOACTIVE SEED LOCALIZATION;  Surgeon: [name], MD;  Location: [place];  Service: General;  Laterality: Right;  . BREAST SURGERY    . CARDIOVASCULAR STRESS TEST  01/14/2012   Low risk nuclear study w/ small mild apical and apical lateral reversible perfusion defect represents a small area of ischemia versus shifting breast artifact/  normal LV funciton and wall motion, ef 86%  . CARPAL TUNNEL RELEASE Right 1977  . CATARACT EXTRACTION W/ INTRAOCULAR LENS  IMPLANT, BILATERAL Bilateral left 1996/  right 1997  . CLOSED RIGHT KNEE MANIPULATION  08/11/2002   post TKA  . CYSTOSTOMY N/A 05/17/2016   Procedure: CYSTOSCOPY WITH  SUPRAPUBIC PLACMENT;  Surgeon: [name], MD;  Location: [facility];  Service: Urology;  Laterality: N/A;  . EYE SURGERY    . GANGLION CYST EXCISION Right 12/09/2001   right palm  . HERNIA REPAIR    . JOINT REPLACEMENT    . KNEE ARTHROSCOPY Right 12/14/2002   w/ Lysis Adhesions  . LUMBAR  LAMINECTOMY/DECOMPRESSION MICRODISCECTOMY N/A 03/06/2016   Procedure: CENTRAL DECOMPRESSION L3 - L4 ,L4 - L5;  Surgeon: [name], MD;  Location: [facility];  Service: Orthopedics;  Laterality: N/A;  . SHOULDER HEMI-ARTHROPLASTY Right 02/20/2009   avascular necrosis   . TOTAL KNEE ARTHROPLASTY Bilateral right 06-23-2002/  left 07-11-2008  . [place]  . VAGINAL HYSTERECTOMY  1975    MEDICATIONS: . acetaminophen (TYLENOL) 500 MG tablet  . aspirin EC 81 MG tablet  . atorvastatin (LIPITOR) 10 MG tablet  . bisacodyl (DULCOLAX) 10 MG suppository  . Cholecalciferol (VITAMIN D) 50 MCG (2000 UT) tablet  . cyanocobalamin 1000 MCG tablet  . esomeprazole (NEXIUM) 40 MG capsule  . furosemide (LASIX) 40 MG tablet  . gabapentin (NEURONTIN) 300 MG capsule  . glipiZIDE (GLUCOTROL XL) 5 MG 24 hr tablet  . glipiZIDE (GLUCOTROL) 10 MG tablet  . Multiple Vitamins-Minerals (PRESERVISION AREDS PO)  . ondansetron (ZOFRAN-ODT) 4 MG disintegrating tablet  . oxyCODONE-acetaminophen (PERCOCET) 10-325 MG tablet  . polyethylene glycol (MIRALAX / GLYCOLAX) 17 g packet  . Potassium 99 MG TABS  . traMADol (ULTRAM) 50 MG tablet   No current facility-administered medications for this encounter.  Instructed to follow surgeon orders regarding when to hold ASA for surgery.    Myra Gianotti, PA-C Surgical Short Stay/Anesthesiology [facility] Phone [phone] [facility] Phone [phone] 04/20/2020 9:41 AM

## 2020-04-18 NOTE — Telephone Encounter (Signed)
   [facility] Pre-operative Risk Assessment    Request for surgical clearance:  1. What type of surgery is being performed? LAMINECTOMY, POSTERIOR LUMBAR INTERBODY FUSION, INTERBODY PROSTHESIS, LUMBAR 2- LUMBAR 3, LUMBAR 3 - LUMBAR 4, LUMBAR 4- LUMBAR 5 2. When is this surgery scheduled? 04-24-2020   3. What type of clearance is required (medical clearance vs. Pharmacy clearance to hold med vs. Both)? BOTH  4. Are there any medications that need to be held prior to surgery and how long? NOT LISTED   5. Practice name and name of physician performing surgery? [place] ATTN: [facility]   6. What is the office phone number? [phone] x [phone]   7.   What is the office fax number? [phone]  8.   Anesthesia type (None, local, MAC, general) ? GENERAL  See previous clearance dated 03-10-20

## 2020-04-18 NOTE — Telephone Encounter (Signed)
   Primary Cardiologist: Freada Bergeron, MD  Chart reviewed as part of pre-operative protocol coverage. Given past medical history and time since last visit, based on ACC/AHA guidelines, Jalie B Blevens would be at acceptable risk for the planned procedure without further cardiovascular testing.   I will route this recommendation to the requesting party via Epic fax function and remove from pre-op pool.  Please call with questions.  Jossie Ng. Nhia Heaphy NP-C    04/18/2020, 2:54 PM [address] Office [phone] Fax [phone]

## 2020-04-18 NOTE — Progress Notes (Signed)
Your procedure is scheduled on Monday, April 24, 2020.  Report to [facility] Main Entrance "A" at 7:20 A.M., and check in at the Admitting office.  Call this number if you have problems the morning of surgery:  [phone]  Call [phone] if you have any questions prior to your surgery date Monday-Friday 8am-4pm    Remember:  Do not eat or drink after midnight the night before your surgery    Take these medicines the morning of surgery with A SIP OF WATER: atorvastatin (LIPITOR) esomeprazole (NEXIUM) traMADol (ULTRAM) acetaminophen (TYLENOL) - if needed ondansetron (ZOFRAN-ODT) - if needed  Follow your surgeon's instructions on when to stop Aspirin.  If no instructions were given by your surgeon then you will need to call the office to get those instructions.    As of today, STOP taking any Aleve, Naproxen, Ibuprofen, Motrin, Advil, Goody's, BC's, all herbal medications, fish oil, and all vitamins.   WHAT DO I DO ABOUT MY DIABETES MEDICATION?    Do not take your glipiZIDE (GLUCOTROL) the night before surgery OR the morning of surgery.   HOW TO MANAGE YOUR DIABETES BEFORE AND AFTER SURGERY  Why is it important to control my blood sugar before and after surgery?  Improving blood sugar levels before and after surgery helps healing and can limit problems.  A way of improving blood sugar control is eating a healthy diet by: o  Eating less sugar and carbohydrates o  Increasing activity/exercise o  Talking with your doctor about reaching your blood sugar goals  High blood sugars (greater than 180 mg/dL) can raise your risk of infections and slow your recovery, so you will need to focus on controlling your diabetes during the weeks before surgery.  Make sure that the doctor who takes care of your diabetes knows about your planned surgery including the date and location.  How do I manage my blood sugar before surgery?  Check your blood sugar at least 4 times a day,  starting 2 days before surgery, to make sure that the level is not too high or low.  Check your blood sugar the morning of your surgery when you wake up and every 2 hours until you get to the Short Stay unit. o If your blood sugar is less than 70 mg/dL, you will need to treat for low blood sugar: - Do not take insulin. - Treat a low blood sugar (less than 70 mg/dL) with  cup of clear juice (cranberry or apple), 4 glucose tablets, OR glucose gel. - Recheck blood sugar in 15 minutes after treatment (to make sure it is greater than 70 mg/dL). If your blood sugar is not greater than 70 mg/dL on recheck, call [phone] for further instructions.  Report your blood sugar to the short stay nurse when you get to Short Stay.   If you are admitted to the hospital after surgery: o Your blood sugar will be checked by the staff and you will probably be given insulin after surgery (instead of oral diabetes medicines) to make sure you have good blood sugar levels. o The goal for blood sugar control after surgery is 80-180 mg/dL.                      Do not wear jewelry, make up, or nail polish            Do not wear lotions, powders, perfumes, or deodorant.  Do not shave 48 hours prior to surgery.            Do not bring valuables to the hospital.            [facility] is not responsible for any belongings or valuables.  Do NOT Smoke (Tobacco/Vaping) or drink Alcohol 24 hours prior to your procedure If you use a CPAP at night, you may bring all equipment for your overnight stay.   Contacts, glasses, dentures or bridgework may not be worn into surgery.      For patients admitted to the hospital, discharge time will be determined by your treatment team.   Patients discharged the day of surgery will not be allowed to drive home, and someone needs to stay with them for 24 hours.    Special instructions:   [facility]- Preparing For Surgery  Before surgery, you can play an important  role. Because skin is not sterile, your skin needs to be as free of germs as possible. You can reduce the number of germs on your skin by washing with CHG (chlorahexidine gluconate) Soap before surgery.  CHG is an antiseptic cleaner which kills germs and bonds with the skin to continue killing germs even after washing.    Oral Hygiene is also important to reduce your risk of infection.  Remember - BRUSH YOUR TEETH THE MORNING OF SURGERY WITH YOUR REGULAR TOOTHPASTE  Please do not use if you have an allergy to CHG or antibacterial soaps. If your skin becomes reddened/irritated stop using the CHG.  Do not shave (including legs and underarms) for at least 48 hours prior to first CHG shower. It is OK to shave your face.  Please follow these instructions carefully.   1. Shower the NIGHT BEFORE SURGERY and the MORNING OF SURGERY with CHG Soap.   2. If you chose to wash your hair, wash your hair first as usual with your normal shampoo.  3. After you shampoo, rinse your hair and body thoroughly to remove the shampoo.  4. Use CHG as you would any other liquid soap. You can apply CHG directly to the skin and wash gently with a scrungie or a clean washcloth.   5. Apply the CHG Soap to your body ONLY FROM THE NECK DOWN.  Do not use on open wounds or open sores. Avoid contact with your eyes, ears, mouth and genitals (private parts). Wash Face and genitals (private parts)  with your normal soap.   6. Wash thoroughly, paying special attention to the area where your surgery will be performed.  7. Thoroughly rinse your body with warm water from the neck down.  8. DO NOT shower/wash with your normal soap after using and rinsing off the CHG Soap.  9. Pat yourself dry with a CLEAN TOWEL.  10. Wear CLEAN PAJAMAS to bed the night before surgery  11. Place CLEAN SHEETS on your bed the night of your first shower and DO NOT SLEEP WITH PETS.   Day of Surgery: Wear Clean/Comfortable clothing the morning of  surgery Do not apply any deodorants/lotions.   Remember to brush your teeth WITH YOUR REGULAR TOOTHPASTE.   Please read over the following fact sheets that you were given.

## 2020-04-19 ENCOUNTER — Encounter (HOSPITAL_COMMUNITY)
Admission: RE | Admit: 2020-04-19 | Discharge: 2020-04-19 | Disposition: A | Discharge status: Home / Self Care | Payer: Medicare Other | Source: Ambulatory Visit | Attending: Neurosurgery | Admitting: Neurosurgery

## 2020-04-19 ENCOUNTER — Encounter (HOSPITAL_COMMUNITY): Payer: Self-pay

## 2020-04-19 ENCOUNTER — Other Ambulatory Visit: Payer: Self-pay

## 2020-04-19 DIAGNOSIS — E785 Hyperlipidemia, unspecified: Secondary | ICD-10-CM | POA: Diagnosis not present

## 2020-04-19 DIAGNOSIS — Z01812 Encounter for preprocedural laboratory examination: Secondary | ICD-10-CM | POA: Diagnosis not present

## 2020-04-19 DIAGNOSIS — K219 Gastro-esophageal reflux disease without esophagitis: Secondary | ICD-10-CM | POA: Diagnosis not present

## 2020-04-19 DIAGNOSIS — M1990 Unspecified osteoarthritis, unspecified site: Secondary | ICD-10-CM | POA: Diagnosis not present

## 2020-04-19 DIAGNOSIS — Z79891 Long term (current) use of opiate analgesic: Secondary | ICD-10-CM | POA: Insufficient documentation

## 2020-04-19 DIAGNOSIS — M4316 Spondylolisthesis, lumbar region: Secondary | ICD-10-CM | POA: Insufficient documentation

## 2020-04-19 DIAGNOSIS — N183 Chronic kidney disease, stage 3 unspecified: Secondary | ICD-10-CM | POA: Insufficient documentation

## 2020-04-19 DIAGNOSIS — M5136 Other intervertebral disc degeneration, lumbar region: Secondary | ICD-10-CM | POA: Insufficient documentation

## 2020-04-19 DIAGNOSIS — E119 Type 2 diabetes mellitus without complications: Secondary | ICD-10-CM | POA: Insufficient documentation

## 2020-04-19 DIAGNOSIS — N312 Flaccid neuropathic bladder, not elsewhere classified: Secondary | ICD-10-CM | POA: Insufficient documentation

## 2020-04-19 DIAGNOSIS — Z87440 Personal history of urinary (tract) infections: Secondary | ICD-10-CM | POA: Insufficient documentation

## 2020-04-19 DIAGNOSIS — D631 Anemia in chronic kidney disease: Secondary | ICD-10-CM | POA: Diagnosis not present

## 2020-04-19 DIAGNOSIS — Z7982 Long term (current) use of aspirin: Secondary | ICD-10-CM | POA: Diagnosis not present

## 2020-04-19 DIAGNOSIS — G8929 Other chronic pain: Secondary | ICD-10-CM | POA: Diagnosis not present

## 2020-04-19 DIAGNOSIS — Z7984 Long term (current) use of oral hypoglycemic drugs: Secondary | ICD-10-CM | POA: Insufficient documentation

## 2020-04-19 DIAGNOSIS — Z960 Presence of urogenital implants: Secondary | ICD-10-CM | POA: Insufficient documentation

## 2020-04-19 DIAGNOSIS — Z96651 Presence of right artificial knee joint: Secondary | ICD-10-CM | POA: Insufficient documentation

## 2020-04-19 DIAGNOSIS — I4510 Unspecified right bundle-branch block: Secondary | ICD-10-CM | POA: Diagnosis not present

## 2020-04-19 DIAGNOSIS — Z79899 Other long term (current) drug therapy: Secondary | ICD-10-CM | POA: Diagnosis not present

## 2020-04-19 DIAGNOSIS — Z791 Long term (current) use of non-steroidal anti-inflammatories (NSAID): Secondary | ICD-10-CM | POA: Insufficient documentation

## 2020-04-19 DIAGNOSIS — Z853 Personal history of malignant neoplasm of breast: Secondary | ICD-10-CM | POA: Insufficient documentation

## 2020-04-19 LAB — CBC
HCT: 35.6 % — ABNORMAL LOW (ref 36.0–46.0)
Hemoglobin: 11.1 g/dL — ABNORMAL LOW (ref 12.0–15.0)
MCH: 28.6 pg (ref 26.0–34.0)
MCHC: 31.2 g/dL (ref 30.0–36.0)
MCV: 91.8 fL (ref 80.0–100.0)
Platelets: 268 10*3/uL (ref 150–400)
RBC: 3.88 MIL/uL (ref 3.87–5.11)
RDW: 13.7 % (ref 11.5–15.5)
WBC: 4.5 10*3/uL (ref 4.0–10.5)
nRBC: 0 % (ref 0.0–0.2)

## 2020-04-19 LAB — SURGICAL PCR SCREEN
MRSA, PCR: NEGATIVE
Staphylococcus aureus: NEGATIVE

## 2020-04-19 LAB — BASIC METABOLIC PANEL
Anion gap: 10 (ref 5–15)
BUN: 20 mg/dL (ref 8–23)
CO2: 30 mmol/L (ref 22–32)
Calcium: 9.8 mg/dL (ref 8.9–10.3)
Chloride: 97 mmol/L — ABNORMAL LOW (ref 98–111)
Creatinine, Ser: 1.43 mg/dL — ABNORMAL HIGH (ref 0.44–1.00)
GFR, Estimated: 33 mL/min — ABNORMAL LOW (ref >60)
Glucose, Bld: 130 mg/dL — ABNORMAL HIGH (ref 70–99)
Potassium: 3.9 mmol/L (ref 3.5–5.1)
Sodium: 137 mmol/L (ref 135–145)

## 2020-04-19 LAB — GLUCOSE, CAPILLARY: Glucose-Capillary: 130 mg/dL — ABNORMAL HIGH (ref 70–99)

## 2020-04-19 LAB — TYPE AND SCREEN
ABO/RH(D): O POS
Antibody Screen: NEGATIVE

## 2020-04-19 NOTE — Progress Notes (Signed)
PCP - Dr. Shanon Ace Cardiologist - Dr. Gwyndolyn Kaufman  PPM/ICD - Denies  Chest x-ray - N/A EKG - 03/30/20 Stress Test - 04/13/20 ECHO - 04/13/20 Cardiac Cath - Denies  Sleep Study - Denies   Fasting Blood Sugar - 100s Checks Blood Sugar 1X every 2-3 days  Blood Thinner Instructions: N/A Aspirin Instructions: N/A  ERAS Protcol - No  COVID TEST- 04/20/20   Anesthesia review: Yes, cardiac hx; clearance in Epic 03/30/20  Patient denies shortness of breath, fever, cough and chest pain at PAT appointment   All instructions explained to the patient, with a verbal understanding of the material. Patient agrees to go over the instructions while at home for a better understanding. Patient also instructed to self quarantine after being tested for COVID-19. The opportunity to ask questions was provided.

## 2020-04-20 ENCOUNTER — Other Ambulatory Visit (HOSPITAL_COMMUNITY)
Admission: RE | Admit: 2020-04-20 | Discharge: 2020-04-20 | Disposition: A | Discharge status: Home / Self Care | Payer: Medicare Other | Source: Ambulatory Visit | Attending: Neurosurgery | Admitting: Neurosurgery

## 2020-04-20 DIAGNOSIS — Z20822 Contact with and (suspected) exposure to covid-19: Secondary | ICD-10-CM | POA: Insufficient documentation

## 2020-04-20 DIAGNOSIS — Z01812 Encounter for preprocedural laboratory examination: Secondary | ICD-10-CM | POA: Diagnosis not present

## 2020-04-20 LAB — SARS CORONAVIRUS 2 (TAT 6-24 HRS): SARS Coronavirus 2: NEGATIVE

## 2020-04-20 NOTE — Anesthesia Preprocedure Evaluation (Addendum)
Anesthesia Evaluation  Patient identified by MRN, date of birth, ID band Patient awake    Reviewed: Patient's Chart, lab work & pertinent test results  History of Anesthesia Complications (+) PONV  Airway Mallampati: II  TM Distance: >3 FB Neck ROM: Full    Dental  (+) Upper Dentures, Partial Lower   Pulmonary neg pulmonary ROS,    Pulmonary exam normal        Cardiovascular  Rhythm:Regular Rate:Normal  RBBB chronic   Neuro/Psych Depression negative neurological ROS     GI/Hepatic Neg liver ROS, hiatal hernia, GERD  Medicated,diverticulosis   Endo/Other  diabetes, Well Controlled, Type 2  Renal/GU CRFRenal diseaseStage III Bladder dysfunction  Neurogenic bladder with urinary retention, recurrent UTIs, suprapubic catheter     Musculoskeletal  (+) Arthritis , Osteoarthritis,  Right breast cancer 2017 s/p lumpectomy and radiation   Abdominal (+)  Abdomen: soft. Bowel sounds: normal.  Peds  Hematology  (+) anemia ,   Anesthesia Other Findings   Reproductive/Obstetrics                           Anesthesia Physical Anesthesia Plan  ASA: III  Anesthesia Plan: General   Post-op Pain Management:    Induction: Intravenous  PONV Risk Score and Plan: 4 or greater and Ondansetron, Dexamethasone, Propofol infusion and Treatment may vary due to age or medical condition  Airway Management Planned: Mask and Oral ETT  Additional Equipment: None  Intra-op Plan:   Post-operative Plan: Extubation in OR  Informed Consent: I have reviewed the patients History and Physical, chart, labs and discussed the procedure including the risks, benefits and alternatives for the proposed anesthesia with the patient or authorized representative who has indicated his/her understanding and acceptance.     Dental advisory given  Plan Discussed with: CRNA and Surgeon  Anesthesia Plan Comments: (PAT note  written 04/20/2020 by Myra Gianotti, PA-C. Lab Results      Component                Value               Date                      WBC                      4.5                 04/19/2020                HGB                      11.1 (L)            04/19/2020                HCT                      35.6 (L)            04/19/2020                MCV                      91.8                04/19/2020  PLT                      268                 04/19/2020           Lab Results      Component                Value               Date                      NA                       137                 04/19/2020                K                        3.9                 04/19/2020                CO2                      30                  04/19/2020                GLUCOSE                  130 (H)             04/19/2020                BUN                      20                  04/19/2020                CREATININE               1.43 (H)            04/19/2020                CALCIUM                  9.8                 04/19/2020                GFRNONAA                 33 (L)              04/19/2020                GFRAA                    36 (L)              03/09/2020            Stress 10/21: Myocardial perfusion is normal.   This is a low risk study.  Overall left ventricular systolic function was normal.    Nuclear stress  EF:  79%.  The left ventricular ejection fraction is hyperdynamic (>65%).    ECHO 10/21: 1. Left ventricular ejection fraction, by estimation, is 60 to 65%. The left ventricle has normal function. The left ventricle has no regional wall motion abnormalities. There is mild concentric left ventricular hypertrophy of the basal-septal segment.  Left ventricular diastolic parameters are consistent with Grade I diastolic dysfunction (impaired relaxation). 2. Right ventricular systolic function is normal. The right ventricular size is normal. There is normal pulmonary  artery systolic pressure. 3. Average HR 91 bpm.. The mitral valve is abnormal. No evidence of mitral valve regurgitation. Moderate mitral annular calcification. 4. Left atrial size was mildly dilated. 5. The aortic valve is tricuspid. There is moderate calcification of the aortic valve. Aortic valve regurgitation is not visualized. Mild aortic valve sclerosis is present, with no evidence of aortic valve stenosis. 6. Aortic Normal Ascending Ao. 7. The inferior vena cava is normal in size with greater than 50% respiratory variability, suggesting right atrial pressure of 3 mmHg.  )      Anesthesia Quick Evaluation

## 2020-04-24 ENCOUNTER — Other Ambulatory Visit: Payer: Self-pay

## 2020-04-24 ENCOUNTER — Inpatient Hospital Stay (HOSPITAL_COMMUNITY)
Admission: RE | Admit: 2020-04-24 | Discharge: 2020-05-02 | DRG: 454 | Disposition: A | Discharge status: Home / Self Care | Payer: Medicare Other | Attending: Neurosurgery | Admitting: Neurosurgery

## 2020-04-24 ENCOUNTER — Inpatient Hospital Stay (HOSPITAL_COMMUNITY): Payer: Medicare Other | Admitting: Vascular Surgery

## 2020-04-24 ENCOUNTER — Inpatient Hospital Stay (HOSPITAL_COMMUNITY): Payer: Medicare Other

## 2020-04-24 ENCOUNTER — Encounter (HOSPITAL_COMMUNITY): Payer: Self-pay | Admitting: Neurosurgery

## 2020-04-24 ENCOUNTER — Inpatient Hospital Stay (HOSPITAL_COMMUNITY): Payer: Medicare Other | Admitting: Certified Registered Nurse Anesthetist

## 2020-04-24 ENCOUNTER — Inpatient Hospital Stay (HOSPITAL_COMMUNITY)
Admission: RE | Disposition: A | Discharge status: Home / Self Care | Payer: Self-pay | Source: Home / Self Care | Attending: Neurosurgery

## 2020-04-24 DIAGNOSIS — E669 Obesity, unspecified: Secondary | ICD-10-CM | POA: Diagnosis not present

## 2020-04-24 DIAGNOSIS — I5042 Chronic combined systolic (congestive) and diastolic (congestive) heart failure: Secondary | ICD-10-CM | POA: Diagnosis present

## 2020-04-24 DIAGNOSIS — Z800 Family history of malignant neoplasm of digestive organs: Secondary | ICD-10-CM | POA: Diagnosis not present

## 2020-04-24 DIAGNOSIS — M48062 Spinal stenosis, lumbar region with neurogenic claudication: Secondary | ICD-10-CM | POA: Diagnosis present

## 2020-04-24 DIAGNOSIS — K5903 Drug induced constipation: Secondary | ICD-10-CM | POA: Diagnosis present

## 2020-04-24 DIAGNOSIS — I452 Bifascicular block: Secondary | ICD-10-CM | POA: Diagnosis present

## 2020-04-24 DIAGNOSIS — E871 Hypo-osmolality and hyponatremia: Secondary | ICD-10-CM | POA: Diagnosis present

## 2020-04-24 DIAGNOSIS — K5909 Other constipation: Secondary | ICD-10-CM | POA: Diagnosis present

## 2020-04-24 DIAGNOSIS — Z87440 Personal history of urinary (tract) infections: Secondary | ICD-10-CM

## 2020-04-24 DIAGNOSIS — M5116 Intervertebral disc disorders with radiculopathy, lumbar region: Principal | ICD-10-CM | POA: Diagnosis present

## 2020-04-24 DIAGNOSIS — Z471 Aftercare following joint replacement surgery: Secondary | ICD-10-CM | POA: Diagnosis present

## 2020-04-24 DIAGNOSIS — E1122 Type 2 diabetes mellitus with diabetic chronic kidney disease: Secondary | ICD-10-CM | POA: Diagnosis not present

## 2020-04-24 DIAGNOSIS — Z96653 Presence of artificial knee joint, bilateral: Secondary | ICD-10-CM | POA: Diagnosis present

## 2020-04-24 DIAGNOSIS — E43 Unspecified severe protein-calorie malnutrition: Secondary | ICD-10-CM | POA: Diagnosis present

## 2020-04-24 DIAGNOSIS — M5416 Radiculopathy, lumbar region: Secondary | ICD-10-CM | POA: Diagnosis not present

## 2020-04-24 DIAGNOSIS — Z961 Presence of intraocular lens: Secondary | ICD-10-CM | POA: Diagnosis not present

## 2020-04-24 DIAGNOSIS — I4510 Unspecified right bundle-branch block: Secondary | ICD-10-CM | POA: Diagnosis not present

## 2020-04-24 DIAGNOSIS — Z803 Family history of malignant neoplasm of breast: Secondary | ICD-10-CM | POA: Diagnosis not present

## 2020-04-24 DIAGNOSIS — E1169 Type 2 diabetes mellitus with other specified complication: Secondary | ICD-10-CM | POA: Diagnosis not present

## 2020-04-24 DIAGNOSIS — M4316 Spondylolisthesis, lumbar region: Secondary | ICD-10-CM | POA: Diagnosis present

## 2020-04-24 DIAGNOSIS — Z923 Personal history of irradiation: Secondary | ICD-10-CM | POA: Diagnosis not present

## 2020-04-24 DIAGNOSIS — K219 Gastro-esophageal reflux disease without esophagitis: Secondary | ICD-10-CM | POA: Diagnosis present

## 2020-04-24 DIAGNOSIS — Z833 Family history of diabetes mellitus: Secondary | ICD-10-CM | POA: Diagnosis not present

## 2020-04-24 DIAGNOSIS — N1832 Chronic kidney disease, stage 3b: Secondary | ICD-10-CM | POA: Diagnosis present

## 2020-04-24 DIAGNOSIS — E46 Unspecified protein-calorie malnutrition: Secondary | ICD-10-CM | POA: Diagnosis not present

## 2020-04-24 DIAGNOSIS — Z9842 Cataract extraction status, left eye: Secondary | ICD-10-CM

## 2020-04-24 DIAGNOSIS — I10 Essential (primary) hypertension: Secondary | ICD-10-CM | POA: Diagnosis not present

## 2020-04-24 DIAGNOSIS — G894 Chronic pain syndrome: Secondary | ICD-10-CM | POA: Diagnosis not present

## 2020-04-24 DIAGNOSIS — I5043 Acute on chronic combined systolic (congestive) and diastolic (congestive) heart failure: Secondary | ICD-10-CM | POA: Diagnosis not present

## 2020-04-24 DIAGNOSIS — G8918 Other acute postprocedural pain: Secondary | ICD-10-CM | POA: Diagnosis not present

## 2020-04-24 DIAGNOSIS — E1165 Type 2 diabetes mellitus with hyperglycemia: Secondary | ICD-10-CM | POA: Diagnosis present

## 2020-04-24 DIAGNOSIS — I130 Hypertensive heart and chronic kidney disease with heart failure and stage 1 through stage 4 chronic kidney disease, or unspecified chronic kidney disease: Secondary | ICD-10-CM | POA: Diagnosis present

## 2020-04-24 DIAGNOSIS — N319 Neuromuscular dysfunction of bladder, unspecified: Secondary | ICD-10-CM | POA: Diagnosis not present

## 2020-04-24 DIAGNOSIS — I129 Hypertensive chronic kidney disease with stage 1 through stage 4 chronic kidney disease, or unspecified chronic kidney disease: Secondary | ICD-10-CM | POA: Diagnosis not present

## 2020-04-24 DIAGNOSIS — Z9841 Cataract extraction status, right eye: Secondary | ICD-10-CM

## 2020-04-24 DIAGNOSIS — M1990 Unspecified osteoarthritis, unspecified site: Secondary | ICD-10-CM | POA: Diagnosis present

## 2020-04-24 DIAGNOSIS — Z419 Encounter for procedure for purposes other than remedying health state, unspecified: Principal | ICD-10-CM

## 2020-04-24 DIAGNOSIS — Z79899 Other long term (current) drug therapy: Secondary | ICD-10-CM

## 2020-04-24 DIAGNOSIS — Z888 Allergy status to other drugs, medicaments and biological substances status: Secondary | ICD-10-CM

## 2020-04-24 DIAGNOSIS — K5900 Constipation, unspecified: Secondary | ICD-10-CM | POA: Diagnosis not present

## 2020-04-24 DIAGNOSIS — E785 Hyperlipidemia, unspecified: Secondary | ICD-10-CM | POA: Diagnosis not present

## 2020-04-24 DIAGNOSIS — Z7984 Long term (current) use of oral hypoglycemic drugs: Secondary | ICD-10-CM

## 2020-04-24 DIAGNOSIS — Z86010 Personal history of colonic polyps: Secondary | ICD-10-CM | POA: Diagnosis not present

## 2020-04-24 DIAGNOSIS — K5901 Slow transit constipation: Secondary | ICD-10-CM | POA: Diagnosis not present

## 2020-04-24 DIAGNOSIS — E8809 Other disorders of plasma-protein metabolism, not elsewhere classified: Secondary | ICD-10-CM | POA: Diagnosis present

## 2020-04-24 DIAGNOSIS — Z8249 Family history of ischemic heart disease and other diseases of the circulatory system: Secondary | ICD-10-CM

## 2020-04-24 DIAGNOSIS — Z8679 Personal history of other diseases of the circulatory system: Secondary | ICD-10-CM | POA: Diagnosis not present

## 2020-04-24 DIAGNOSIS — E876 Hypokalemia: Secondary | ICD-10-CM | POA: Diagnosis not present

## 2020-04-24 DIAGNOSIS — N183 Chronic kidney disease, stage 3 unspecified: Secondary | ICD-10-CM | POA: Diagnosis not present

## 2020-04-24 DIAGNOSIS — M48061 Spinal stenosis, lumbar region without neurogenic claudication: Secondary | ICD-10-CM | POA: Diagnosis not present

## 2020-04-24 DIAGNOSIS — Z974 Presence of external hearing-aid: Secondary | ICD-10-CM

## 2020-04-24 DIAGNOSIS — D62 Acute posthemorrhagic anemia: Secondary | ICD-10-CM | POA: Diagnosis not present

## 2020-04-24 DIAGNOSIS — Z981 Arthrodesis status: Secondary | ICD-10-CM | POA: Diagnosis not present

## 2020-04-24 DIAGNOSIS — M4326 Fusion of spine, lumbar region: Secondary | ICD-10-CM | POA: Diagnosis not present

## 2020-04-24 HISTORY — PX: LAMINECTOMY: SHX219

## 2020-04-24 LAB — GLUCOSE, CAPILLARY
Glucose-Capillary: 151 mg/dL — ABNORMAL HIGH (ref 70–99)
Glucose-Capillary: 158 mg/dL — ABNORMAL HIGH (ref 70–99)
Glucose-Capillary: 189 mg/dL — ABNORMAL HIGH (ref 70–99)
Glucose-Capillary: 210 mg/dL — ABNORMAL HIGH (ref 70–99)
Glucose-Capillary: 237 mg/dL — ABNORMAL HIGH (ref 70–99)
Glucose-Capillary: 269 mg/dL — ABNORMAL HIGH (ref 70–99)

## 2020-04-24 SURGERY — POSTERIOR LUMBAR FUSION 3 LEVEL
Anesthesia: General | Site: Spine Lumbar

## 2020-04-24 MED ORDER — ACETAMINOPHEN 10 MG/ML IV SOLN
INTRAVENOUS | Status: DC | PRN
  Administered 2020-04-24: 1000 mg via INTRAVENOUS

## 2020-04-24 MED ORDER — FUROSEMIDE 40 MG PO TABS
40.0000 mg | ORAL_TABLET | Freq: Every day | ORAL | Status: DC
  Administered 2020-04-25 – 2020-05-02 (×8): 40 mg via ORAL
  Filled 2020-04-24 (×9): qty 1

## 2020-04-24 MED ORDER — THROMBIN 5000 UNITS EX SOLR
CUTANEOUS | Status: AC
  Filled 2020-04-24: qty 5000

## 2020-04-24 MED ORDER — ONDANSETRON 4 MG PO TBDP: 4.0000 mg | ORAL_TABLET | Freq: Three times a day (TID) | ORAL | Status: DC | PRN

## 2020-04-24 MED ORDER — CEFAZOLIN SODIUM-DEXTROSE 2-4 GM/100ML-% IV SOLN
2.0000 g | INTRAVENOUS | Status: AC
  Administered 2020-04-24: 2 g via INTRAVENOUS
  Filled 2020-04-24: qty 100

## 2020-04-24 MED ORDER — THROMBIN 20000 UNITS EX SOLR
CUTANEOUS | Status: DC | PRN
  Administered 2020-04-24: 20 mL

## 2020-04-24 MED ORDER — THROMBIN 5000 UNITS EX SOLR
OROMUCOSAL | Status: DC | PRN
  Administered 2020-04-24 (×4): 5 mL

## 2020-04-24 MED ORDER — OXYCODONE-ACETAMINOPHEN 10-325 MG PO TABS: 1.0000 | ORAL_TABLET | ORAL | Status: DC | PRN

## 2020-04-24 MED ORDER — ROCURONIUM BROMIDE 10 MG/ML (PF) SYRINGE
PREFILLED_SYRINGE | INTRAVENOUS | Status: AC
  Filled 2020-04-24: qty 10

## 2020-04-24 MED ORDER — PHENYLEPHRINE 40 MCG/ML (10ML) SYRINGE FOR IV PUSH (FOR BLOOD PRESSURE SUPPORT)
PREFILLED_SYRINGE | INTRAVENOUS | Status: AC
  Filled 2020-04-24: qty 30

## 2020-04-24 MED ORDER — PHENYLEPHRINE 40 MCG/ML (10ML) SYRINGE FOR IV PUSH (FOR BLOOD PRESSURE SUPPORT)
PREFILLED_SYRINGE | INTRAVENOUS | Status: DC | PRN
  Administered 2020-04-24 (×5): 80 ug via INTRAVENOUS
  Administered 2020-04-24: 120 ug via INTRAVENOUS
  Administered 2020-04-24: 40 ug via INTRAVENOUS
  Administered 2020-04-24: 80 ug via INTRAVENOUS

## 2020-04-24 MED ORDER — ONDANSETRON HCL 4 MG/2ML IJ SOLN
4.0000 mg | Freq: Four times a day (QID) | INTRAMUSCULAR | Status: DC | PRN
  Administered 2020-04-26: 4 mg via INTRAVENOUS
  Filled 2020-04-24: qty 2

## 2020-04-24 MED ORDER — ORAL CARE MOUTH RINSE: 15.0000 mL | Freq: Once | OROMUCOSAL | Status: AC

## 2020-04-24 MED ORDER — GLIPIZIDE 5 MG PO TABS
10.0000 mg | ORAL_TABLET | Freq: Every morning | ORAL | Status: DC
  Administered 2020-04-25 – 2020-05-02 (×7): 10 mg via ORAL
  Filled 2020-04-24 (×8): qty 2

## 2020-04-24 MED ORDER — ROCURONIUM BROMIDE 100 MG/10ML IV SOLN
INTRAVENOUS | Status: DC | PRN
  Administered 2020-04-24: 50 mg via INTRAVENOUS
  Administered 2020-04-24 (×3): 10 mg via INTRAVENOUS
  Administered 2020-04-24: 20 mg via INTRAVENOUS

## 2020-04-24 MED ORDER — VITAMIN D 25 MCG (1000 UNIT) PO TABS
2000.0000 [IU] | ORAL_TABLET | Freq: Every day | ORAL | Status: DC
  Administered 2020-04-25 – 2020-05-02 (×8): 2000 [IU] via ORAL
  Filled 2020-04-24 (×8): qty 2

## 2020-04-24 MED ORDER — DOCUSATE SODIUM 100 MG PO CAPS
100.0000 mg | ORAL_CAPSULE | Freq: Two times a day (BID) | ORAL | Status: DC
  Administered 2020-04-24 – 2020-05-02 (×16): 100 mg via ORAL
  Filled 2020-04-24 (×16): qty 1

## 2020-04-24 MED ORDER — PHENOL 1.4 % MT LIQD: 1.0000 | OROMUCOSAL | Status: DC | PRN

## 2020-04-24 MED ORDER — PROPOFOL 1000 MG/100ML IV EMUL
INTRAVENOUS | Status: AC
  Filled 2020-04-24: qty 100

## 2020-04-24 MED ORDER — ACETAMINOPHEN 500 MG PO TABS
1000.0000 mg | ORAL_TABLET | Freq: Four times a day (QID) | ORAL | Status: AC
  Administered 2020-04-24 – 2020-04-25 (×4): 1000 mg via ORAL
  Filled 2020-04-24 (×4): qty 2

## 2020-04-24 MED ORDER — ALBUMIN HUMAN 5 % IV SOLN: INTRAVENOUS | Status: DC | PRN

## 2020-04-24 MED ORDER — BUPIVACAINE-EPINEPHRINE 0.5% -1:200000 IJ SOLN
INTRAMUSCULAR | Status: AC
  Filled 2020-04-24: qty 1

## 2020-04-24 MED ORDER — FENTANYL CITRATE (PF) 250 MCG/5ML IJ SOLN
INTRAMUSCULAR | Status: AC
  Filled 2020-04-24: qty 5

## 2020-04-24 MED ORDER — SODIUM CHLORIDE (PF) 0.9 % IJ SOLN
INTRAMUSCULAR | Status: AC
  Filled 2020-04-24: qty 20

## 2020-04-24 MED ORDER — PHENYLEPHRINE HCL-NACL 10-0.9 MG/250ML-% IV SOLN
INTRAVENOUS | Status: DC | PRN
  Administered 2020-04-24: 40 ug/min via INTRAVENOUS

## 2020-04-24 MED ORDER — DEXAMETHASONE SODIUM PHOSPHATE 10 MG/ML IJ SOLN
INTRAMUSCULAR | Status: DC | PRN
  Administered 2020-04-24: 5 mg via INTRAVENOUS

## 2020-04-24 MED ORDER — POTASSIUM 99 MG PO TABS: 99.0000 mg | ORAL_TABLET | Freq: Every day | ORAL | Status: DC

## 2020-04-24 MED ORDER — CEFAZOLIN SODIUM 1 G IJ SOLR
INTRAMUSCULAR | Status: AC
  Filled 2020-04-24: qty 20

## 2020-04-24 MED ORDER — CHLORHEXIDINE GLUCONATE CLOTH 2 % EX PADS: 6.0000 | MEDICATED_PAD | Freq: Once | CUTANEOUS | Status: DC

## 2020-04-24 MED ORDER — THROMBIN 20000 UNITS EX SOLR
CUTANEOUS | Status: AC
  Filled 2020-04-24: qty 20000

## 2020-04-24 MED ORDER — SODIUM CHLORIDE 0.9 % IV SOLN: INTRAVENOUS | Status: DC | PRN

## 2020-04-24 MED ORDER — HYDROMORPHONE HCL 1 MG/ML IJ SOLN
0.2500 mg | INTRAMUSCULAR | Status: DC | PRN
  Administered 2020-04-24: 0.25 mg via INTRAVENOUS
  Administered 2020-04-24: 0.5 mg via INTRAVENOUS
  Administered 2020-04-24: 0.25 mg via INTRAVENOUS

## 2020-04-24 MED ORDER — GABAPENTIN 300 MG PO CAPS
300.0000 mg | ORAL_CAPSULE | Freq: Every day | ORAL | Status: DC
  Administered 2020-04-24 – 2020-05-01 (×8): 300 mg via ORAL
  Filled 2020-04-24 (×8): qty 1

## 2020-04-24 MED ORDER — ONDANSETRON HCL 4 MG/2ML IJ SOLN: 4.0000 mg | Freq: Once | INTRAMUSCULAR | Status: DC | PRN

## 2020-04-24 MED ORDER — EPHEDRINE 5 MG/ML INJ
INTRAVENOUS | Status: AC
  Filled 2020-04-24: qty 10

## 2020-04-24 MED ORDER — HYDROMORPHONE HCL 1 MG/ML IJ SOLN
INTRAMUSCULAR | Status: AC
  Filled 2020-04-24: qty 1

## 2020-04-24 MED ORDER — ZOLPIDEM TARTRATE 5 MG PO TABS: 5.0000 mg | ORAL_TABLET | Freq: Every evening | ORAL | Status: DC | PRN

## 2020-04-24 MED ORDER — OXYCODONE-ACETAMINOPHEN 5-325 MG PO TABS
1.0000 | ORAL_TABLET | ORAL | Status: DC | PRN
  Administered 2020-04-25 – 2020-05-02 (×18): 1 via ORAL
  Filled 2020-04-24 (×18): qty 1

## 2020-04-24 MED ORDER — LACTATED RINGERS IV SOLN: INTRAVENOUS | Status: DC | PRN

## 2020-04-24 MED ORDER — SODIUM CHLORIDE 0.9% FLUSH
3.0000 mL | Freq: Two times a day (BID) | INTRAVENOUS | Status: DC
  Administered 2020-04-25 – 2020-05-02 (×8): 3 mL via INTRAVENOUS

## 2020-04-24 MED ORDER — BISACODYL 10 MG RE SUPP: 10.0000 mg | Freq: Every day | RECTAL | Status: DC | PRN

## 2020-04-24 MED ORDER — OXYCODONE HCL 5 MG PO TABS: 5.0000 mg | ORAL_TABLET | Freq: Once | ORAL | Status: DC | PRN

## 2020-04-24 MED ORDER — OXYCODONE HCL 5 MG/5ML PO SOLN: 5.0000 mg | Freq: Once | ORAL | Status: DC | PRN

## 2020-04-24 MED ORDER — HEMOSTATIC AGENTS (NO CHARGE) OPTIME
TOPICAL | Status: DC | PRN
  Administered 2020-04-24: 1 via TOPICAL

## 2020-04-24 MED ORDER — ACETAMINOPHEN 10 MG/ML IV SOLN
INTRAVENOUS | Status: AC
  Filled 2020-04-24: qty 100

## 2020-04-24 MED ORDER — ACETAMINOPHEN 325 MG PO TABS
650.0000 mg | ORAL_TABLET | ORAL | Status: DC | PRN
  Administered 2020-04-25 – 2020-04-28 (×5): 650 mg via ORAL
  Filled 2020-04-24 (×5): qty 2

## 2020-04-24 MED ORDER — ONDANSETRON HCL 4 MG/2ML IJ SOLN
INTRAMUSCULAR | Status: DC | PRN
  Administered 2020-04-24: 4 mg via INTRAVENOUS

## 2020-04-24 MED ORDER — ACETAMINOPHEN 160 MG/5ML PO SOLN: 325.0000 mg | ORAL | Status: DC | PRN

## 2020-04-24 MED ORDER — ACETAMINOPHEN 325 MG PO TABS: 325.0000 mg | ORAL_TABLET | ORAL | Status: DC | PRN

## 2020-04-24 MED ORDER — ATORVASTATIN CALCIUM 10 MG PO TABS
10.0000 mg | ORAL_TABLET | Freq: Every day | ORAL | Status: DC
  Administered 2020-04-24 – 2020-05-02 (×9): 10 mg via ORAL
  Filled 2020-04-24 (×9): qty 1

## 2020-04-24 MED ORDER — MENTHOL 3 MG MT LOZG
1.0000 | LOZENGE | OROMUCOSAL | Status: DC | PRN
  Administered 2020-04-28: 3 mg via ORAL
  Filled 2020-04-24: qty 9

## 2020-04-24 MED ORDER — PROPOFOL 10 MG/ML IV BOLUS
INTRAVENOUS | Status: DC | PRN
  Administered 2020-04-24: 120 mg via INTRAVENOUS

## 2020-04-24 MED ORDER — ACETAMINOPHEN 650 MG RE SUPP: 650.0000 mg | RECTAL | Status: DC | PRN

## 2020-04-24 MED ORDER — SODIUM CHLORIDE (PF) 0.9 % IJ SOLN
INTRAMUSCULAR | Status: DC | PRN
  Administered 2020-04-24: 10 mL

## 2020-04-24 MED ORDER — LACTATED RINGERS IV SOLN: INTRAVENOUS | Status: DC

## 2020-04-24 MED ORDER — OXYCODONE HCL 5 MG PO TABS
5.0000 mg | ORAL_TABLET | ORAL | Status: DC | PRN
  Administered 2020-04-24 – 2020-05-02 (×11): 5 mg via ORAL
  Filled 2020-04-24 (×12): qty 1

## 2020-04-24 MED ORDER — SODIUM CHLORIDE 0.9 % IV SOLN: 250.0000 mL | INTRAVENOUS | Status: DC

## 2020-04-24 MED ORDER — TRAMADOL HCL 50 MG PO TABS
50.0000 mg | ORAL_TABLET | Freq: Two times a day (BID) | ORAL | Status: DC
  Administered 2020-04-24 – 2020-05-02 (×12): 50 mg via ORAL
  Filled 2020-04-24 (×12): qty 1

## 2020-04-24 MED ORDER — GLIPIZIDE ER 5 MG PO TB24
5.0000 mg | ORAL_TABLET | Freq: Every evening | ORAL | Status: DC
  Administered 2020-04-24 – 2020-05-02 (×9): 5 mg via ORAL
  Filled 2020-04-24 (×10): qty 1

## 2020-04-24 MED ORDER — MORPHINE SULFATE (PF) 4 MG/ML IV SOLN
4.0000 mg | INTRAVENOUS | Status: DC | PRN
  Administered 2020-04-24 – 2020-05-02 (×8): 4 mg via INTRAVENOUS
  Filled 2020-04-24 (×9): qty 1

## 2020-04-24 MED ORDER — ONDANSETRON HCL 4 MG PO TABS: 4.0000 mg | ORAL_TABLET | Freq: Four times a day (QID) | ORAL | Status: DC | PRN

## 2020-04-24 MED ORDER — LIDOCAINE 2% (20 MG/ML) 5 ML SYRINGE
INTRAMUSCULAR | Status: AC
  Filled 2020-04-24: qty 5

## 2020-04-24 MED ORDER — SODIUM CHLORIDE 0.9% FLUSH
3.0000 mL | INTRAVENOUS | Status: DC | PRN
  Administered 2020-04-29: 3 mL via INTRAVENOUS

## 2020-04-24 MED ORDER — FENTANYL CITRATE (PF) 250 MCG/5ML IJ SOLN
INTRAMUSCULAR | Status: DC | PRN
  Administered 2020-04-24: 50 ug via INTRAVENOUS
  Administered 2020-04-24 (×2): 25 ug via INTRAVENOUS
  Administered 2020-04-24 (×2): 50 ug via INTRAVENOUS
  Administered 2020-04-24: 25 ug via INTRAVENOUS
  Administered 2020-04-24: 50 ug via INTRAVENOUS

## 2020-04-24 MED ORDER — BUPIVACAINE-EPINEPHRINE (PF) 0.5% -1:200000 IJ SOLN
INTRAMUSCULAR | Status: DC | PRN
  Administered 2020-04-24: 10 mL

## 2020-04-24 MED ORDER — BISACODYL 10 MG RE SUPP
10.0000 mg | Freq: Every day | RECTAL | Status: DC | PRN
  Administered 2020-05-02: 10 mg via RECTAL
  Filled 2020-04-24: qty 1

## 2020-04-24 MED ORDER — BACITRACIN ZINC 500 UNIT/GM EX OINT
TOPICAL_OINTMENT | CUTANEOUS | Status: DC | PRN
  Administered 2020-04-24: 1 via TOPICAL

## 2020-04-24 MED ORDER — 0.9 % SODIUM CHLORIDE (POUR BTL) OPTIME
TOPICAL | Status: DC | PRN
  Administered 2020-04-24: 1000 mL

## 2020-04-24 MED ORDER — CHLORHEXIDINE GLUCONATE 0.12 % MT SOLN
15.0000 mL | Freq: Once | OROMUCOSAL | Status: AC
  Administered 2020-04-24: 15 mL via OROMUCOSAL
  Filled 2020-04-24: qty 15

## 2020-04-24 MED ORDER — BACITRACIN ZINC 500 UNIT/GM EX OINT
TOPICAL_OINTMENT | CUTANEOUS | Status: AC
  Filled 2020-04-24: qty 28.35

## 2020-04-24 MED ORDER — SUGAMMADEX SODIUM 200 MG/2ML IV SOLN
INTRAVENOUS | Status: DC | PRN
  Administered 2020-04-24: 200 mg via INTRAVENOUS

## 2020-04-24 MED ORDER — PANTOPRAZOLE SODIUM 40 MG PO TBEC
80.0000 mg | DELAYED_RELEASE_TABLET | Freq: Every day | ORAL | Status: DC
  Administered 2020-04-25 – 2020-05-02 (×8): 80 mg via ORAL
  Filled 2020-04-24 (×8): qty 2

## 2020-04-24 MED ORDER — CYCLOBENZAPRINE HCL 10 MG PO TABS
10.0000 mg | ORAL_TABLET | Freq: Three times a day (TID) | ORAL | Status: DC | PRN
  Administered 2020-04-24 – 2020-04-26 (×3): 10 mg via ORAL
  Filled 2020-04-24 (×3): qty 1

## 2020-04-24 MED ORDER — LIDOCAINE 2% (20 MG/ML) 5 ML SYRINGE
INTRAMUSCULAR | Status: DC | PRN
  Administered 2020-04-24: 40 mg via INTRAVENOUS

## 2020-04-24 MED ORDER — POLYETHYLENE GLYCOL 3350 17 G PO PACK
17.0000 g | PACK | Freq: Every day | ORAL | Status: DC | PRN
  Administered 2020-04-30 – 2020-05-02 (×2): 17 g via ORAL
  Filled 2020-04-24 (×2): qty 1

## 2020-04-24 MED ORDER — CEFAZOLIN SODIUM-DEXTROSE 2-4 GM/100ML-% IV SOLN
2.0000 g | Freq: Three times a day (TID) | INTRAVENOUS | Status: AC
  Administered 2020-04-24 – 2020-04-25 (×2): 2 g via INTRAVENOUS
  Filled 2020-04-24 (×2): qty 100

## 2020-04-24 MED ORDER — PROPOFOL 500 MG/50ML IV EMUL
INTRAVENOUS | Status: DC | PRN
  Administered 2020-04-24: 25 ug/kg/min via INTRAVENOUS

## 2020-04-24 SURGICAL SUPPLY — 75 items
APL SKNCLS STERI-STRIP NONHPOA (GAUZE/BANDAGES/DRESSINGS) ×1
BENZOIN TINCTURE PRP APPL 2/3 (GAUZE/BANDAGES/DRESSINGS) ×3 IMPLANT
BLADE CLIPPER SURG (BLADE) IMPLANT
BUR MATCHSTICK NEURO 3.0 LAGG (BURR) ×3 IMPLANT
BUR PRECISION FLUTE 6.0 (BURR) ×3 IMPLANT
CAGE ALTERA 10X31X9-13 15D (Cage) ×2 IMPLANT
CAGE ALTERA 9-13-15-31MM (Cage) ×1 IMPLANT
CANISTER SUCT 3000ML PPV (MISCELLANEOUS) ×3 IMPLANT
CAP LOCK DLX THRD (Cap) ×24 IMPLANT
CARTRIDGE OIL MAESTRO DRILL (MISCELLANEOUS) ×1 IMPLANT
CLOSURE STERI-STRIP 1/2X4 (GAUZE/BANDAGES/DRESSINGS) ×1
CLOSURE WOUND 1/2 X4 (GAUZE/BANDAGES/DRESSINGS) ×1
CLSR STERI-STRIP ANTIMIC 1/2X4 (GAUZE/BANDAGES/DRESSINGS) ×2 IMPLANT
CNTNR URN SCR LID CUP LEK RST (MISCELLANEOUS) ×1 IMPLANT
CONN CROSS 6.35X58-70 (Connector) ×3 IMPLANT
CONNECTOR CROSS 6.35X58-70 (Connector) ×1 IMPLANT
CONT SPEC 4OZ STRL OR WHT (MISCELLANEOUS) ×3
COVER BACK TABLE 60X90IN (DRAPES) ×3 IMPLANT
COVER WAND RF STERILE (DRAPES) ×3 IMPLANT
DECANTER SPIKE VIAL GLASS SM (MISCELLANEOUS) ×3 IMPLANT
DIFFUSER DRILL AIR PNEUMATIC (MISCELLANEOUS) ×3 IMPLANT
DRAPE C-ARM 42X72 X-RAY (DRAPES) ×6 IMPLANT
DRAPE HALF SHEET 40X57 (DRAPES) ×3 IMPLANT
DRAPE LAPAROTOMY 100X72X124 (DRAPES) ×3 IMPLANT
DRAPE SURG 17X23 STRL (DRAPES) ×12 IMPLANT
DRSG OPSITE POSTOP 4X10 (GAUZE/BANDAGES/DRESSINGS) ×3 IMPLANT
DRSG OPSITE POSTOP 4X6 (GAUZE/BANDAGES/DRESSINGS) ×3 IMPLANT
ELECT BLADE 4.0 EZ CLEAN MEGAD (MISCELLANEOUS) ×3
ELECT REM PT RETURN 9FT ADLT (ELECTROSURGICAL) ×3
ELECTRODE BLDE 4.0 EZ CLN MEGD (MISCELLANEOUS) ×1 IMPLANT
ELECTRODE REM PT RTRN 9FT ADLT (ELECTROSURGICAL) ×1 IMPLANT
GAUZE 4X4 16PLY RFD (DISPOSABLE) ×3 IMPLANT
GLOVE BIO SURGEON STRL SZ8 (GLOVE) ×6 IMPLANT
GLOVE BIO SURGEON STRL SZ8.5 (GLOVE) ×6 IMPLANT
GLOVE EXAM NITRILE XL STR (GLOVE) IMPLANT
GOWN STRL REUS W/ TWL LRG LVL3 (GOWN DISPOSABLE) IMPLANT
GOWN STRL REUS W/ TWL XL LVL3 (GOWN DISPOSABLE) ×2 IMPLANT
GOWN STRL REUS W/TWL 2XL LVL3 (GOWN DISPOSABLE) IMPLANT
GOWN STRL REUS W/TWL LRG LVL3 (GOWN DISPOSABLE)
GOWN STRL REUS W/TWL XL LVL3 (GOWN DISPOSABLE) ×6
GRAFT BONE PROTEIOS LRG 5CC (Orthopedic Implant) ×3 IMPLANT
HEMOSTAT POWDER KIT SURGIFOAM (HEMOSTASIS) ×12 IMPLANT
KIT BASIN OR (CUSTOM PROCEDURE TRAY) ×3 IMPLANT
KIT TURNOVER KIT B (KITS) ×3 IMPLANT
MILL MEDIUM DISP (BLADE) ×3 IMPLANT
NEEDLE HYPO 21X1.5 SAFETY (NEEDLE) IMPLANT
NEEDLE HYPO 22GX1.5 SAFETY (NEEDLE) ×3 IMPLANT
NS IRRIG 1000ML POUR BTL (IV SOLUTION) ×3 IMPLANT
OIL CARTRIDGE MAESTRO DRILL (MISCELLANEOUS) ×3
PACK LAMINECTOMY NEURO (CUSTOM PROCEDURE TRAY) ×3 IMPLANT
PAD ARMBOARD 7.5X6 YLW CONV (MISCELLANEOUS) ×9 IMPLANT
PATTIES SURGICAL .5 X.5 (GAUZE/BANDAGES/DRESSINGS) ×6 IMPLANT
PATTIES SURGICAL .5 X1 (DISPOSABLE) IMPLANT
PATTIES SURGICAL 1X1 (DISPOSABLE) ×15 IMPLANT
PUTTY DBM 10CC CALC GRAN (Putty) ×6 IMPLANT
ROD CURVED TI 6.35X125 (Rod) ×6 IMPLANT
SCREW PA CREO DLX 6.5X45 (Screw) ×6 IMPLANT
SCREW PA CREO DLX 6.5X50 (Screw) ×15 IMPLANT
SCREW PA CREO DLX 6.5X55 (Screw) ×3 IMPLANT
SEALANT ADHERUS EXTEND TIP (MISCELLANEOUS) ×3 IMPLANT
SPACER ALTERA 10X31 8-12MM-8 (Spacer) ×6 IMPLANT
SPONGE LAP 4X18 RFD (DISPOSABLE) ×3 IMPLANT
SPONGE NEURO XRAY DETECT 1X3 (DISPOSABLE) ×3 IMPLANT
SPONGE SURGIFOAM ABS GEL 100 (HEMOSTASIS) ×3 IMPLANT
STRIP CLOSURE SKIN 1/2X4 (GAUZE/BANDAGES/DRESSINGS) ×2 IMPLANT
SUT PROLENE 6 0 BV (SUTURE) ×12 IMPLANT
SUT VIC AB 1 CT1 18XBRD ANBCTR (SUTURE) ×3 IMPLANT
SUT VIC AB 1 CT1 8-18 (SUTURE) ×9
SUT VIC AB 2-0 CP2 18 (SUTURE) ×6 IMPLANT
SYR 20ML LL LF (SYRINGE) IMPLANT
SYR 5ML LL (SYRINGE) ×3 IMPLANT
TOWEL GREEN STERILE (TOWEL DISPOSABLE) ×3 IMPLANT
TOWEL GREEN STERILE FF (TOWEL DISPOSABLE) ×3 IMPLANT
TRAY FOLEY MTR SLVR 16FR STAT (SET/KITS/TRAYS/PACK) ×3 IMPLANT
WATER STERILE IRR 1000ML POUR (IV SOLUTION) ×3 IMPLANT

## 2020-04-24 NOTE — Transfer of Care (Signed)
Immediate Anesthesia Transfer of Care Note  Patient: [name]  Procedure(s) Performed: LAMINECTOMY, POSTERIOR LUMBAR INTERBODY FUSION, INTERBODY PROSTHESIS, LUMBAR TWO- LUMBAR THREE, LUMBAR THREE - LUMBAR FOUR, LUMBAR FOUR- LUMBAR FIVE (N/A Spine Lumbar)  Patient Location: PACU  Anesthesia Type:General  Level of Consciousness: drowsy  Airway & Oxygen Therapy: Patient Spontanous Breathing and Patient connected to face mask oxygen  Post-op Assessment: Report given to RN and Post -op Vital signs reviewed and stable  Post vital signs: Reviewed  Last Vitals:  Vitals Value Taken Time  BP 139/66 04/24/20 1639  Temp    Pulse 90 04/24/20 1646  Resp 11 04/24/20 1646  SpO2 100 % 04/24/20 1646  Vitals shown include unvalidated device data.  Last Pain:  Vitals:   04/24/20 1639  TempSrc:   PainSc: Asleep         Complications: No complications documented.

## 2020-04-24 NOTE — Anesthesia Postprocedure Evaluation (Signed)
Anesthesia Post Note  Patient: [name]  Procedure(s) Performed: LAMINECTOMY, POSTERIOR LUMBAR INTERBODY FUSION, INTERBODY PROSTHESIS, LUMBAR TWO- LUMBAR THREE, LUMBAR THREE - LUMBAR FOUR, LUMBAR FOUR- LUMBAR FIVE (N/A Spine Lumbar)     Patient location during evaluation: PACU Anesthesia Type: General Level of consciousness: awake Pain management: pain level controlled Vital Signs Assessment: post-procedure vital signs reviewed and stable Respiratory status: spontaneous breathing, nonlabored ventilation, respiratory function stable and patient connected to nasal cannula oxygen Cardiovascular status: blood pressure returned to baseline and stable Postop Assessment: no apparent nausea or vomiting Anesthetic complications: no   No complications documented.  Last Vitals:  Vitals:   04/24/20 1809 04/24/20 1841  BP: 120/83 (!) 148/77  Pulse: 90 100  Resp: 10 18  Temp: 36.5 C   SpO2: 94% 95%    Last Pain:  Vitals:   04/24/20 2111  TempSrc:   PainSc: 9                  Emrik Erhard P Dorethia Jeanmarie

## 2020-04-24 NOTE — Progress Notes (Signed)
Subjective: The patient is somnolent but arousable.  She is in no apparent distress.  Objective: Vital signs in last 24 hours: Temp:  [97.7 F (36.5 C)] 97.7 F (36.5 C) (10/18 0750) Pulse Rate:  [90-105] 90 (10/18 1639) Resp:  [14-18] 14 (10/18 1639) BP: (139-156)/(51-66) 139/66 (10/18 1639) SpO2:  [96 %-100 %] 100 % (10/18 1639) Weight:  [66.7 kg] 66.7 kg (10/18 0750) Estimated body mass index is 28.24 kg/m as calculated from the following:   Height as of this encounter: 5' 0.5" (1.537 m).   Weight as of this encounter: 66.7 kg.   Intake/Output from previous day: No intake/output data recorded. Intake/Output this shift: Total I/O In: 3500 [I.V.:2350; Blood:200; IV Piggyback:950] Out: 900 [Urine:300; Blood:600]  Physical exam the patient is somnolent but arousable.  She is moving her lower extremities well.  Lab Results: No results for input(s): WBC, HGB, HCT, PLT in the last 72 hours. BMET No results for input(s): NA, K, CL, CO2, GLUCOSE, BUN, CREATININE, CALCIUM in the last 72 hours.  Studies/Results: DG Lumbar Spine 2-3 Views  Result Date: 04/24/2020 CLINICAL DATA:  L2-L5 PLIF EXAM: LUMBAR SPINE - 2-3 VIEW; DG C-ARM 1-60 MIN COMPARISON:  12/25/2019 FLUOROSCOPY TIME:  1 minutes 6 seconds Images: 2 FINDINGS: Five lumbar vertebra by prior radiographs. Images demonstrate BILATERAL pedicle screws at L2, L3, L4, and L5 with intervening disc prostheses. Diffuse osseous demineralization. Anterolisthesis at L4-L5 again seen. Linear density projecting over the operative field question sponge. IMPRESSION: Intraoperative images during posterior L2-L5 fusion. Electronically Signed   By: Lavonia Dana M.D.   On: 04/24/2020 15:51   DG Lumbar Spine 1 View  Result Date: 04/24/2020 CLINICAL DATA:  Localization for lumbar fusion EXAM: LUMBAR SPINE - 1 VIEW COMPARISON:  None. FINDINGS: Cross-table lateral view demonstrates localizer dorsal to the L2-L3 disc level. Subsequent image  demonstrates localizer is at L2-L3 and L3-L4 disc spaces. IMPRESSION: Intraoperative localization. Electronically Signed   By: Macy Mis M.D.   On: 04/24/2020 15:41   DG Lumbar Spine 1 View  Result Date: 04/24/2020 CLINICAL DATA:  Laminectomy with posterior lumbar interbody fusion L2 through L5. EXAM: LUMBAR SPINE - 1 VIEW COMPARISON:  Lumbar spine radiographs 12/25/2019 and 02/18/2020. Intraoperative radiograph earlier the same date. FINDINGS: Cross-table lateral view at 1045 hours labeled image 2. Prior studies demonstrate 5 lumbar type vertebral bodies. There is a grade 1 anterolisthesis with disc space narrowing at L3-4 and L4-5. Skin spreaders are present posteriorly with a surgical instrument directed over the L3 pedicles. IMPRESSION: Intraoperative localization as described. Electronically Signed   By: Richardean Sale M.D.   On: 04/24/2020 11:06   DG C-Arm 1-60 Min  Result Date: 04/24/2020 CLINICAL DATA:  L2-L5 PLIF EXAM: LUMBAR SPINE - 2-3 VIEW; DG C-ARM 1-60 MIN COMPARISON:  12/25/2019 FLUOROSCOPY TIME:  1 minutes 6 seconds Images: 2 FINDINGS: Five lumbar vertebra by prior radiographs. Images demonstrate BILATERAL pedicle screws at L2, L3, L4, and L5 with intervening disc prostheses. Diffuse osseous demineralization. Anterolisthesis at L4-L5 again seen. Linear density projecting over the operative field question sponge. IMPRESSION: Intraoperative images during posterior L2-L5 fusion. Electronically Signed   By: Lavonia Dana M.D.   On: 04/24/2020 15:51    Assessment/Plan: The patient is doing well.  I spoke with her daughter.  LOS: 0 days     [name] 04/24/2020, 4:45 PM

## 2020-04-24 NOTE — Op Note (Signed)
Brief history: The patient is an 86 year old white female who has had previous lumbar laminectomy.  She has developed recurrent back and leg pain consistent with neurogenic claudication.  She has failed medical management and was worked up with lumbar x-rays and lumbar MRI which demonstrated L2-3, L3-4 and L4-5 spondylolisthesis, spinal stenosis, etc.  I discussed the various treatment options with her.  She has decided to proceed with surgery after weighing the risk, benefits and alternatives.  Preoperative diagnosis: Lumbar spondylolisthesis, degenerative disc disease, spinal stenosis compressing L3, L4 and L5 nerve roots; lumbago; lumbar radiculopathy; neurogenic claudication  Postoperative diagnosis: The same  Procedure: Bilateral redo L2-3, L3-4 and L4-5 laminotomy/laminectomy/foraminotomies/medial facetectomy to decompress the bilateral L3, L4 and L5 nerve roots(the work required to do this was in addition to the work required to do the posterior lumbar interbody fusion because of the patient's spinal stenosis, facet arthropathy. Etc. requiring a wide decompression of the nerve roots.);  L2-3, L3-4 and L4-5 transforaminal lumbar interbody fusion with ProteiOS, local morselized autograft bone and Zimmer DBM; insertion of interbody prosthesis at L2-3, L3-4 and L4-5 (globus peek expandable interbody prosthesis); posterior segmental instrumentation from L2 to L5 with globus titanium pedicle screws and rods; posterior lateral arthrodesis at L2-3, L3-4 and L4-5 with ProteiOS,  local morselized autograft bone and Zimmer DBM.  Surgeon: Dr. Earle Gell  Asst.: Arnetha Massy, NP  Anesthesia: Gen. endotracheal  Estimated blood loss: 600 cc  Drains: 1 medium Hemovac in the epidural space  Complications: Durotomy  Description of procedure: The patient was brought to the operating room by the anesthesia team. General endotracheal anesthesia was induced. The patient was turned to the prone position on  the Wilson frame. The patient's lumbosacral region was then prepared with Betadine scrub and Betadine solution. Sterile drapes were applied.  I then injected the area to be incised with Marcaine with epinephrine solution. I then used the scalpel to make a linear midline incision over the L2-3, L3-4 and L4-5 interspace, incising through the old surgical scar. I then used electrocautery to perform a bilateral subperiosteal dissection exposing the spinous process and remainder of the lamina at L2, L3, L4 and L5. We then obtained intraoperative radiograph to confirm our location. We then inserted the Verstrac retractor to provide exposure.  I began the decompression by using the high speed drill to widen the previous laminotomies bilaterally at L2-3 and L3-4. We then used the Kerrison punches to widen the laminotomy and removed the residual ligamentum flavum and scar tissue at L2-3, L3-4 and L4-5. We used the Kerrison punches to remove the medial facets at L2-3, L3-4 and L4-5 bilaterally. We performed wide foraminotomies about the bilateral L3, L4 and L5 nerve roots completing the decompression.  We did create durotomies which we closed with 6-0 Prolene sutures.  And before the final closure, we covered the durotomy closures with tissue glue.  We now turned our attention to the posterior lumbar interbody fusion. I used a scalpel to incise the intervertebral disc at L2-3, L3-4 and L4-5 . I then performed a partial intervertebral discectomy at L2-3, L3-4 and L4-5 on the left using the pituitary forceps. We prepared the vertebral endplates at R5-1, O8-4 and L4-5 for the fusion by removing the soft tissues with the curettes. We then used the trial spacers to pick the appropriate sized interbody prosthesis. We prefilled his prosthesis with a combination of local morselized autograft bone that we obtained during the decompression as well as Zimmer DBM. We inserted the prefilled prosthesis  into the interspace at L2-3,  L3-4 and L4-5 from the left,, we then turned and expanded the prosthesis. There was a good snug fit of the prosthesis in the interspace. We then filled and the remainder of the intervertebral disc space with ProteiOS, local morselized autograft bone and Zimmer DBM. This completed the posterior lumbar interbody arthrodesis.  During the decompression and insertion of the prosthesis the assistant protected the thecal sac and nerve roots with the D'Errico retractor.  We now turned attention to the instrumentation. Under limited fluoroscopic guidance entheses her bones did not show up well using fluoroscopy) we cannulated the bilateral 2, L3, L4 and L5 pedicles with the bone probe. We then removed the bone probe. We then tapped the pedicle with a 6.5 millimeter tap. We then removed the tap. We probed inside the tapped pedicle with a ball probe to rule out cortical breaches. We then inserted a 7.5 x 45, 50 and 55 millimeter pedicle screw into the L2, L3, L4 and L5 pedicles bilaterally under fluoroscopic guidance. We then palpated along the medial aspect of the pedicles to rule out cortical breaches. There were none. The nerve roots were not injured. We then connected the unilateral pedicle screws with a lordotic rod.  I did not compressed the construct because the patient's bone was relatively soft.  We then tightened the caps appropriately.  We placed a cross connector between the rods.  This completed the instrumentation from L2-L5 bilaterally.  We now turned our attention to the posterior lateral arthrodesis at L2-3, L3-4 and L4-5. We used the high-speed drill to decorticate the remainder of the facets, pars, transverse process at L2-3, L3-4 and L4-5. We then applied a combination of ProteiOS,  local morselized autograft bone and Zimmer DBM over these decorticated posterior lateral structures. This completed the posterior lateral arthrodesis.  We then obtained hemostasis using bipolar electrocautery. We irrigated  the wound out with bacitracin solution. We inspected the thecal sac and nerve roots and noted they were well decompressed. We then removed the retractor.  We placed a 10 mm flat Jackson-Pratt drain in the epidural space and tunneled it out through a separate stab wound.  We reapproximated patient's thoracolumbar fascia with interrupted #1 Vicryl suture. We reapproximated patient's subcutaneous tissue with interrupted 2-0 Vicryl suture. The reapproximated patient's skin with Steri-Strips and benzoin. The wound was then coated with bacitracin ointment. A sterile dressing was applied. The drapes were removed. The patient was subsequently returned to the supine position where they were extubated by the anesthesia team. He was then transported to the post anesthesia care unit in stable condition. All sponge instrument and needle counts were reportedly correct at the end of this case.

## 2020-04-24 NOTE — Anesthesia Procedure Notes (Signed)
Procedure Name: Intubation Date/Time: 04/24/2020 9:37 AM Performed by: Janene Harvey, CRNA Pre-anesthesia Checklist: Patient identified, Emergency Drugs available, Suction available and Patient being monitored Patient Re-evaluated:Patient Re-evaluated prior to induction Oxygen Delivery Method: Circle system utilized Preoxygenation: Pre-oxygenation with 100% oxygen Induction Type: IV induction Ventilation: Mask ventilation without difficulty and Oral airway inserted - appropriate to patient size Laryngoscope Size: Mac and 4 Grade View: Grade I Tube type: Oral Tube size: 7.0 mm Number of attempts: 1 Airway Equipment and Method: Stylet and Oral airway Placement Confirmation: ETT inserted through vocal cords under direct vision,  positive ETCO2 and breath sounds checked- equal and bilateral Secured at: 21 cm Tube secured with: Tape Dental Injury: Teeth and Oropharynx as per pre-operative assessment

## 2020-04-24 NOTE — H&P (Signed)
Subjective: The patient is an 86 year old white female who has complained of back and leg pain and weakness and difficulty with walking.  She has failed medical management.  She was worked up with a lumbar MRI which demonstrated him multilevel lumbar spinal listhesis and spinal stenosis.  I discussed the various treatment options with her.  She has decided proceed with surgery.  Past Medical History:  Diagnosis Date  . Anemia   . Arthritis   . Bilateral edema of lower extremity   . 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 Chronic lower back pain   . CKD (chronic kidney disease) stage 3, GFR 30-59 ml/min (HCC)   . DDD (degenerative disc disease)    lumbar  . Diverticulosis   . Family history of breast cancer   . Family history of pancreatic cancer   . Family history of prostate cancer   . Flaccid neuropathic bladder, not elsewhere classified   . Foley catheter in place   . GERD (gastroesophageal reflux disease)   . Hemorrhoids   . Hiatal hernia   . History of colon polyps   . History of radiation therapy 09-12-2015 to 10-12-2015   42.72 gray in 16 fractions directed right breast w/ boost of 12 gray in 6 fractions directed at the lumpectomy cavity- Total dose: 54.72y  . History of recurrent UTIs   . Hyperlipidemia   . PONV (postoperative nausea and vomiting)   . RBBB (right bundle branch block with left anterior fascicular block)   . Type 2 diabetes mellitus ([place])   . Urinary retention with incomplete bladder emptying   . Wears dentures    full upper and lower partial  . Wears glasses   . Wears hearing aid    bilateral but does not wear    Past Surgical History:  Procedure Laterality Date  . BACK SURGERY    . BREAST LUMPECTOMY WITH RADIOACTIVE SEED LOCALIZATION Right  08/03/2015   Procedure: BREAST LUMPECTOMY WITH RADIOACTIVE SEED LOCALIZATION;  Surgeon: [name], MD;  Location: [place];  Service: General;  Laterality: Right;  . BREAST SURGERY    . CARDIOVASCULAR STRESS TEST  01/14/2012   Low risk nuclear study w/ small mild apical and apical lateral reversible perfusion defect represents a small area of ischemia versus shifting breast artifact/  normal LV funciton and wall motion, ef 86%  . CARPAL TUNNEL RELEASE Right 1977  . CATARACT EXTRACTION W/ INTRAOCULAR LENS  IMPLANT, BILATERAL Bilateral left 1996/  right 1997  . CLOSED RIGHT KNEE MANIPULATION  08/11/2002   post TKA  . CYSTOSTOMY N/A 05/17/2016   Procedure: CYSTOSCOPY WITH  SUPRAPUBIC PLACMENT;  Surgeon: [name], MD;  Location: [facility];  Service: Urology;  Laterality: N/A;  . EYE SURGERY    . GANGLION CYST EXCISION Right 12/09/2001   right palm  . HERNIA REPAIR    . JOINT REPLACEMENT    . KNEE ARTHROSCOPY Right 12/14/2002   w/ Lysis Adhesions  . LUMBAR LAMINECTOMY/DECOMPRESSION MICRODISCECTOMY N/A 03/06/2016   Procedure: CENTRAL DECOMPRESSION L3 - L4 ,L4 - L5;  Surgeon: [name], MD;  Location: [facility];  Service: Orthopedics;  Laterality: N/A;  . SHOULDER HEMI-ARTHROPLASTY Right 02/20/2009   avascular necrosis   . TOTAL KNEE ARTHROPLASTY Bilateral right 06-23-2002/  left  07-11-2008  . [place]  . VAGINAL HYSTERECTOMY  1975    Allergies  Allergen Reactions  . Phenergan [Promethazine]     Slurred speech, acted very confused    Social History   Tobacco Use  . Smoking status: Never Smoker  . Smokeless tobacco: Never Used  Substance Use Topics  . Alcohol use: No    Family History  Problem Relation Age of Onset  . Pancreatic cancer Mother 49  . Diabetes Mother   . Prostate cancer Brother   . Diabetes Father   . Heart disease Father   . Diabetes Brother   . Heart attack Brother   . Diabetes Sister   .  Diabetes Daughter   . Diabetes Son   . Coronary artery disease Brother        MI in his 33s  . Colon cancer Neg Hx   . Stomach cancer Neg Hx    Prior to Admission medications   Medication Sig Start Date End Date Taking? Authorizing Provider  acetaminophen (TYLENOL) 500 MG tablet Take 500 mg by mouth every 6 (six) hours as needed for mild pain or moderate pain.    Yes [provider]  atorvastatin (LIPITOR) 10 MG tablet Take 1 tablet (10 mg total) by mouth daily. 01/31/20  Yes Ngetich, Dinah C, NP  bisacodyl (DULCOLAX) 10 MG suppository Place 1 suppository (10 mg total) rectally daily as needed for moderate constipation. 12/31/19  Yes Amin, Ankit Chirag, MD  esomeprazole (NEXIUM) 40 MG capsule TAKE 1 CAPSULE(40 MG) BY MOUTH DAILY 02/16/20  Yes Panosh, Standley Brooking, MD  furosemide (LASIX) 40 MG tablet Take 1 tablet (40 mg total) by mouth daily. Or as directed 01/31/20  Yes Ngetich, Dinah C, NP  gabapentin (NEURONTIN) 300 MG capsule Take 1 capsule (300 mg total) by mouth at bedtime. 02/16/20  Yes Panosh, Standley Brooking, MD  glipiZIDE (GLUCOTROL) 10 MG tablet Take 1 tablet (10 mg total) by mouth in the morning. 03/27/20  Yes Panosh, Standley Brooking, MD  Potassium 99 MG TABS Take 1 tablet (99 mg total) by mouth daily. 01/31/20  Yes Ngetich, Dinah C, NP  traMADol (ULTRAM) 50 MG tablet Take 50 mg by mouth 2 (two) times daily.   Yes [provider]  aspirin EC 81 MG tablet Take 81 mg by mouth daily at 12 noon.     [provider]  Cholecalciferol (VITAMIN D) 50 MCG (2000 UT) tablet Take 2,000 Units by mouth daily.    [provider]  cyanocobalamin 1000 MCG tablet Take 1,000 mcg by mouth at bedtime.     [provider]  glipiZIDE (GLUCOTROL XL) 5 MG 24 hr tablet Take 1 tablet (5 mg total) by mouth every evening. 01/31/20   Ngetich, Dinah C, NP  Multiple Vitamins-Minerals (PRESERVISION AREDS PO) Take 1 capsule by mouth in the morning and at bedtime.    [provider]   ondansetron (ZOFRAN-ODT) 4 MG disintegrating tablet Take 1 tablet (4 mg total) by mouth every 8 (eight) hours as needed for nausea or vomiting. 02/16/20   Panosh, Standley Brooking, MD  oxyCODONE-acetaminophen (PERCOCET) 10-325 MG tablet Take 1 tablet by mouth in the morning and at bedtime.  Patient not taking: Reported on 04/12/2020    [provider]  polyethylene glycol (MIRALAX / GLYCOLAX) 17 g packet Take 17 g by mouth daily as needed for moderate constipation or severe constipation. 12/31/19   Damita Lack, MD     Review of  Systems  Positive ROS: As above  All other systems have been reviewed and were otherwise negative with the exception of those mentioned in the HPI and as above.  Objective: Vital signs in last 24 hours: Temp:  [97.7 F (36.5 C)] 97.7 F (36.5 C) (10/18 0750) Pulse Rate:  [95-105] 95 (10/18 0811) Resp:  [18] 18 (10/18 0750) BP: (156)/(51) 156/51 (10/18 0750) SpO2:  [96 %-97 %] 96 % (10/18 0811) Weight:  [66.7 kg] 66.7 kg (10/18 0750) Estimated body mass index is 28.24 kg/m as calculated from the following:   Height as of this encounter: 5' 0.5" (1.537 m).   Weight as of this encounter: 66.7 kg.   General Appearance: Alert Head: Normocephalic, without obvious abnormality, atraumatic Eyes: PERRL, conjunctiva/corneas clear, EOM's intact,    Ears: Normal  Throat: Normal  Neck: Supple, Back: unremarkable Lungs: Clear to auscultation bilaterally, respirations unlabored Heart: Regular rate and rhythm, no murmur, rub or gallop Abdomen: Soft, non-tender Extremities: Extremities normal, atraumatic, no cyanosis or edema Skin: unremarkable  NEUROLOGIC:   Mental status: alert and oriented,Motor Exam - grossly normal Sensory Exam - grossly normal Reflexes:  Coordination - grossly normal Gait - grossly normal Balance - grossly normal Cranial Nerves: I: smell Not tested  II: visual acuity  OS: Normal  OD: Normal   II: visual fields Full to  confrontation  II: pupils Equal, round, reactive to light  III,VII: ptosis None  III,IV,VI: extraocular muscles  Full ROM  V: mastication Normal  V: facial light touch sensation  Normal  V,VII: corneal reflex  Present  VII: facial muscle function - upper  Normal  VII: facial muscle function - lower Normal  VIII: hearing Not tested  IX: soft palate elevation  Normal  IX,X: gag reflex Present  XI: trapezius strength  5/5  XI: sternocleidomastoid strength 5/5  XI: neck flexion strength  5/5  XII: tongue strength  Normal    Data Review Lab Results  Component Value Date   WBC 4.5 04/19/2020   HGB 11.1 (L) 04/19/2020   HCT 35.6 (L) 04/19/2020   MCV 91.8 04/19/2020   PLT 268 04/19/2020   Lab Results  Component Value Date   NA 137 04/19/2020   K 3.9 04/19/2020   CL 97 (L) 04/19/2020   CO2 30 04/19/2020   BUN 20 04/19/2020   CREATININE 1.43 (H) 04/19/2020   GLUCOSE 130 (H) 04/19/2020   Lab Results  Component Value Date   INR 1.0 12/25/2019    Assessment/Plan: L2-3, L3-4 and L4-5 spondylolisthesis, spinal stenosis, lumbago, lumbar radiculopathy, neurogenic claudication: I have discussed the situation with the patient.  I have reviewed her imaging studies with her and pointed out the abnormalities.  We have discussed the various treatment options including surgery.  I have described the surgical treatment option of an L2-3, L3-4 and L4-5 decompression, instrumentation and fusion.  I have shown her surgical models.  I have given her a surgical pamphlet.  We have discussed the risk, benefits, alternatives, expected postoperative course and the likelihood of achieving our goals with surgery.  I have answered all her questions.  She has decided to proceed with surgery.   Ophelia Charter 04/24/2020 8:42 AM

## 2020-04-25 LAB — CBC
HCT: 21.5 % — ABNORMAL LOW (ref 36.0–46.0)
Hemoglobin: 6.9 g/dL — CL (ref 12.0–15.0)
MCH: 29.6 pg (ref 26.0–34.0)
MCHC: 32.1 g/dL (ref 30.0–36.0)
MCV: 92.3 fL (ref 80.0–100.0)
Platelets: 144 10*3/uL — ABNORMAL LOW (ref 150–400)
RBC: 2.33 MIL/uL — ABNORMAL LOW (ref 3.87–5.11)
RDW: 13.9 % (ref 11.5–15.5)
WBC: 6.9 10*3/uL (ref 4.0–10.5)
nRBC: 0 % (ref 0.0–0.2)

## 2020-04-25 LAB — GLUCOSE, CAPILLARY
Glucose-Capillary: 131 mg/dL — ABNORMAL HIGH (ref 70–99)
Glucose-Capillary: 199 mg/dL — ABNORMAL HIGH (ref 70–99)
Glucose-Capillary: 297 mg/dL — ABNORMAL HIGH (ref 70–99)
Glucose-Capillary: 243 mg/dL — ABNORMAL HIGH (ref 70–99)

## 2020-04-25 LAB — BASIC METABOLIC PANEL
Anion gap: 10 (ref 5–15)
BUN: 17 mg/dL (ref 8–23)
CO2: 25 mmol/L (ref 22–32)
Calcium: 8.4 mg/dL — ABNORMAL LOW (ref 8.9–10.3)
Chloride: 103 mmol/L (ref 98–111)
Creatinine, Ser: 1.28 mg/dL — ABNORMAL HIGH (ref 0.44–1.00)
GFR, Estimated: 37 mL/min — ABNORMAL LOW (ref >60)
Glucose, Bld: 174 mg/dL — ABNORMAL HIGH (ref 70–99)
Potassium: 3.7 mmol/L (ref 3.5–5.1)
Sodium: 138 mmol/L (ref 135–145)

## 2020-04-25 MED ORDER — FERROUS SULFATE 325 (65 FE) MG PO TABS
325.0000 mg | ORAL_TABLET | Freq: Two times a day (BID) | ORAL | Status: DC
  Administered 2020-04-25 – 2020-05-02 (×16): 325 mg via ORAL
  Filled 2020-04-25 (×16): qty 1

## 2020-04-25 MED ORDER — CHLORHEXIDINE GLUCONATE CLOTH 2 % EX PADS
6.0000 | MEDICATED_PAD | Freq: Every day | CUTANEOUS | Status: DC
  Administered 2020-04-25 – 2020-05-02 (×8): 6 via TOPICAL

## 2020-04-25 MED FILL — Thrombin For Soln 5000 Unit: CUTANEOUS | Qty: 2 | Status: AC

## 2020-04-25 MED FILL — Thrombin For Soln 5000 Unit: CUTANEOUS | Qty: 5000 | Status: AC

## 2020-04-25 NOTE — Progress Notes (Signed)
Orthopedic Tech Progress Note Patient Details:  ALMER LITTLETON January 30, 1930 [id number]  Patient ID: [name], female   DOB: 01/31/1930, 86 y.o.   MRN: [id number] Called order into [facility] after offering to fit brace.  Karolee Stamps 04/25/2020, 6:41 AM

## 2020-04-25 NOTE — Progress Notes (Signed)
Subjective: The patient is alert and pleasant.  Her back is appropriately sore.  Objective: Vital signs in last 24 hours: Temp:  [97.3 F (36.3 C)-99 F (37.2 C)] 99 F (37.2 C) (10/19 0416) Pulse Rate:  [88-109] 99 (10/19 0416) Resp:  [10-18] 18 (10/19 0416) BP: (102-156)/(43-83) 106/43 (10/19 0416) SpO2:  [94 %-100 %] 95 % (10/19 0416) Weight:  [66.7 kg] 66.7 kg (10/18 0750) Estimated body mass index is 28.24 kg/m as calculated from the following:   Height as of this encounter: 5' 0.5" (1.537 m).   Weight as of this encounter: 66.7 kg.   Intake/Output from previous day: 10/18 0701 - 10/19 0700 In: 4500 [I.V.:3150; Blood:200; IV Piggyback:1150] Out: 1855 [Urine:1150; Drains:105; Blood:600] Intake/Output this shift: No intake/output data recorded.  Physical exam the patient is alert and oriented.  She is moving her lower extremities well.  Lab Results: Recent Labs    04/25/20 0433  WBC 6.9  HGB 6.9*  HCT 21.5*  PLT 144*   BMET Recent Labs    04/25/20 0433  NA 138  K 3.7  CL 103  CO2 25  GLUCOSE 174*  BUN 17  CREATININE 1.28*  CALCIUM 8.4*    Studies/Results: DG Lumbar Spine 2-3 Views  Result Date: 04/24/2020 CLINICAL DATA:  L2-L5 PLIF EXAM: LUMBAR SPINE - 2-3 VIEW; DG C-ARM 1-60 MIN COMPARISON:  12/25/2019 FLUOROSCOPY TIME:  1 minutes 6 seconds Images: 2 FINDINGS: Five lumbar vertebra by prior radiographs. Images demonstrate BILATERAL pedicle screws at L2, L3, L4, and L5 with intervening disc prostheses. Diffuse osseous demineralization. Anterolisthesis at L4-L5 again seen. Linear density projecting over the operative field question sponge. IMPRESSION: Intraoperative images during posterior L2-L5 fusion. Electronically Signed   By: Lavonia Dana M.D.   On: 04/24/2020 15:51   DG Lumbar Spine 1 View  Result Date: 04/24/2020 CLINICAL DATA:  Localization for lumbar fusion EXAM: LUMBAR SPINE - 1 VIEW COMPARISON:  None. FINDINGS: Cross-table lateral view  demonstrates localizer dorsal to the L2-L3 disc level. Subsequent image demonstrates localizer is at L2-L3 and L3-L4 disc spaces. IMPRESSION: Intraoperative localization. Electronically Signed   By: Macy Mis M.D.   On: 04/24/2020 15:41   DG Lumbar Spine 1 View  Result Date: 04/24/2020 CLINICAL DATA:  Laminectomy with posterior lumbar interbody fusion L2 through L5. EXAM: LUMBAR SPINE - 1 VIEW COMPARISON:  Lumbar spine radiographs 12/25/2019 and 02/18/2020. Intraoperative radiograph earlier the same date. FINDINGS: Cross-table lateral view at 1045 hours labeled image 2. Prior studies demonstrate 5 lumbar type vertebral bodies. There is a grade 1 anterolisthesis with disc space narrowing at L3-4 and L4-5. Skin spreaders are present posteriorly with a surgical instrument directed over the L3 pedicles. IMPRESSION: Intraoperative localization as described. Electronically Signed   By: Richardean Sale M.D.   On: 04/24/2020 11:06   DG C-Arm 1-60 Min  Result Date: 04/24/2020 CLINICAL DATA:  L2-L5 PLIF EXAM: LUMBAR SPINE - 2-3 VIEW; DG C-ARM 1-60 MIN COMPARISON:  12/25/2019 FLUOROSCOPY TIME:  1 minutes 6 seconds Images: 2 FINDINGS: Five lumbar vertebra by prior radiographs. Images demonstrate BILATERAL pedicle screws at L2, L3, L4, and L5 with intervening disc prostheses. Diffuse osseous demineralization. Anterolisthesis at L4-L5 again seen. Linear density projecting over the operative field question sponge. IMPRESSION: Intraoperative images during posterior L2-L5 fusion. Electronically Signed   By: Lavonia Dana M.D.   On: 04/24/2020 15:51    Assessment/Plan: Postop day #1: We will mobilize her with PT and OT.  Acute blood loss anemia: The patient's  hemoglobin is 6.9 but her creatinine has decreased as well.  Her anemia may be partially delusional.  We will start ferrous sulfate and recheck her hemoglobin tomorrow.  LOS: 1 day     [name] 04/25/2020, 7:31 AM

## 2020-04-25 NOTE — TOC Initial Note (Signed)
Transition of Care [facility]) - Initial/Assessment Note    Patient Details  Name: MARLENE BEIDLER MRN: [id number] Date of Birth: 1930-06-02  Transition of Care [facility] CM/SW Contact:    Benard Halsted, LCSW Phone Number: 04/25/2020, 12:10 PM  Clinical Narrative:                 CSW received consult for possible home health services at time of discharge. CSW spoke with patient and her daughter at bedside regarding PT recommendation of Home Health PT at time of discharge. Patient reported that she would like home health services. Patient's daughter requested the agency with the best ratings. CSW sent referral for review with [facility] and referral was accepted for PT/OT/aide. CSW confirmed PCP and address with patient. Patient's daughter reported that patient has been to [facility] and the experience was terrible and they do not want patient going to a SNF ever, even if patient says she wants it.    Expected Discharge Plan: [place] Barriers to Discharge: Continued Medical Work up   Patient Goals and CMS Choice Patient states their goals for this hospitalization and ongoing recovery are:: Return home CMS Medicare.gov Compare Post Acute Care list provided to:: Patient Choice offered to / list presented to : Patient, Adult Children  Expected Discharge Plan and Services Expected Discharge Plan: [place]   Discharge Planning Services: CM Consult Post Acute Care Choice: [place] arrangements for the past 2 months: Single Family Home                           HH Arranged: PT, OT, Nurse's Aide [place] Agency: [facility] ([facility]) Date HH Agency Contacted: 04/25/20 Time [place]: 1209 Representative spoke with at [place]: Junie Panning  Prior Living Arrangements/Services Living arrangements for the past 2 months: [place] with:: Spouse Patient language and need for interpreter reviewed:: Yes Do  you feel safe going back to the place where you live?: Yes      Need for Family Participation in Patient Care: Yes (Comment) Care giver support system in place?: Yes (comment) Current home services: DME Criminal Activity/Legal Involvement Pertinent to Current Situation/Hospitalization: No - Comment as needed  Activities of Daily Living      Permission Sought/Granted Permission sought to share information with : Facility Sport and exercise psychologist, Family Supports Permission granted to share information with : Yes, Verbal Permission Granted  Share Information with NAME: [name]  Permission granted to share info w AGENCY: [facility]  Permission granted to share info w Relationship: Daughter     Emotional Assessment Appearance:: Appears stated age   Affect (typically observed): Accepting, Appropriate Orientation: : Oriented to Self, Oriented to Place, Oriented to  Time, Oriented to Situation Alcohol / Substance Use: Not Applicable Psych Involvement: No (comment)  Admission diagnosis:  Spondylolisthesis of lumbar region [M43.16] Patient Active Problem List   Diagnosis Date Noted  . Spondylolisthesis of lumbar region 04/24/2020  . Urinary retention with incomplete bladder emptying   . Hip pain   . Severe back pain   . AKI (acute kidney injury) ([place])   . Intractable pain/ left hip osteoarthritis 12/25/2019  . Spinal stenosis, lumbar region, with neurogenic claudication 03/06/2016  . Malignant neoplasm of right female breast ([place]) 01/11/2016  . Controlled type 2 diabetes mellitus without complication, without long-term current use of insulin ([place]) 01/11/2016  . Genetic testing 08/18/2015  .  Family history of breast cancer   . Family history of prostate cancer   . Family history of pancreatic cancer   . Breast cancer of upper-outer quadrant of right female breast ([place]) 07/21/2015  . Insomnia 05/06/2015  . Medication management 12/16/2013  . Hand arthritis 12/16/2013  . Rectal bleeding  12/16/2013  . Cold intolerance 06/17/2013  . Renal insufficiency 12/23/2012  . Post-menopause 12/23/2012  . Hyperlipidemia 12/23/2012  . Postmenopausal HRT (hormone replacement therapy) 12/23/2012  . History of shingles 12/23/2012  . Wears hearing aid 12/23/2012  . Nonspecific abnormal unspecified cardiovascular function study 02/28/2012  . Chronic sore throat 01/13/2012  . Hoarse 01/13/2012  . Chest pressure 12/03/2011  . Itching  mid back 12/03/2011  . Abnormal EKG 12/03/2011  . Medicare annual wellness visit, subsequent 12/03/2011  . DJD (degenerative joint disease) 12/03/2011  . DIABETES MELLITUS, TYPE II, CONTROLLED 11/15/2009  . VITAMIN D DEFICIENCY 11/15/2009  . EXOGENOUS OBESITY 11/15/2009  . ANXIETY DEPRESSION 12/22/2008  . HYPERTHYROIDISM 08/23/2008  . TOTAL KNEE REPLACEMENT, LEFT, HX OF 08/23/2008  . Hypothyroidism 10/08/2007  . ANEMIA 10/08/2007  . Osteoarthritis 10/08/2007  . OSTEOPENIA 10/08/2007  . Dyslipidemia 11/07/2006  . GERD (gastroesophageal reflux disease) 11/07/2006   PCP:  Burnis Medin, MD Pharmacy:   [facility] [place], [place] [address] [place] [address] Phone: [phone] Fax: [phone]  [facility] [address], [place] [address] [place] [address] Phone: [phone] Fax: [phone]     Social Determinants of Health (SDOH) Interventions    Readmission Risk Interventions No flowsheet data found.

## 2020-04-25 NOTE — Evaluation (Signed)
Physical Therapy Evaluation Patient Details Name: [name] MRN: [id number] DOB: [birth year] Today's Date: 04/25/2020   History of Present Illness  86 yo female s/p bilateral redo L2-5 laminectomy/foraminotomy and medial facetectomy, L2-5 transforaminal lumbar interbody fusion on 10/18. PMH includes anemia, OA, breast cancer s/p radiation and R lumpectomy, CKD, DDD, UTIs, HLD, RBBB, DMII, R shoulder arthroplasty, bilat TKR.  Clinical Impression   Pt presents with generalized weakness, decreased knowledge of spinal precautions, difficulty performing mobility tasks, decreased activity tolerance. Pt to benefit from acute PT to address deficits. Pt ambulated room distance with use of RW and close guard for form and safety, pt requiring cues for maintaining spinal precautions. PT recommending HHPT and 24/7 assist from daughter and husband at d/c, pt states "I am not going home yet, I am not even leaving tomorrow. I need to stay until I am better". PT to progress mobility as tolerated, and will continue to follow acutely.      Follow Up Recommendations Home health PT;Supervision/Assistance - 24 hour    Equipment Recommendations  None recommended by PT    Recommendations for Other Services       Precautions / Restrictions Precautions Precautions: Fall;Back Precaution Booklet Issued: Yes (comment) Precaution Comments: BLT rules, log roll in/out of bed Required Braces or Orthoses: Spinal Brace Spinal Brace: Lumbar corset;Applied in sitting position Restrictions Weight Bearing Restrictions: No      Mobility  Bed Mobility Overal bed mobility: Needs Assistance Bed Mobility: Rolling;Sit to Sidelying Rolling: Min assist       Sit to sidelying: Min assist General bed mobility comments: Pt up in chair upon PT arrival to room. Min assist for LE lifting into bed, roll onto back via log roll technique.  Transfers Overall transfer level: Needs assistance Equipment used: Rolling walker  (2 wheeled) Transfers: Sit to/from Stand Sit to Stand: Min assist         General transfer comment: Min assist for power up, steadying, Verbal cuing for hand placement when standing/sitting.  Ambulation/Gait Ambulation/Gait assistance: Min guard Gait Distance (Feet): 20 Feet Assistive device: Rolling walker (2 wheeled) Gait Pattern/deviations: Step-through pattern;Decreased stride length;Shuffle;Trunk flexed Gait velocity: decr   General Gait Details: Min guard for safety, verbal and tactile cuing for upright posture, placement in RW.  Stairs            Wheelchair Mobility    Modified Rankin (Stroke Patients Only)       Balance Overall balance assessment: Needs assistance Sitting-balance support: No upper extremity supported;Feet supported Sitting balance-Leahy Scale: Fair     Standing balance support: Bilateral upper extremity supported;During functional activity Standing balance-Leahy Scale: Poor Standing balance comment: reliant on external assist                             Pertinent Vitals/Pain Pain Assessment: Faces Faces Pain Scale: Hurts even more Pain Location: back, L lower leg Pain Descriptors / Indicators: Sore;Discomfort Pain Intervention(s): Limited activity within patient's tolerance;Monitored during session;Repositioned    Home Living Family/patient expects to be discharged to:: Private residence Living Arrangements: Spouse/significant other Available Help at Discharge: Family;Available 24 hours/day (spouse 24/7, cannot physically assist; daughter assists pt and husband mid-afternoon to dinner time daily)   Home Access: Ramped entrance     Home Layout: One level Home Equipment: [place] - 2 wheels;Bedside commode;Shower seat      Prior Function Level of Independence: Needs assistance   Gait / Transfers Assistance Needed: pt  reports walking with RW since d/c from SNF a few months ago  ADL's / Homemaking Assistance Needed: Pt  reports assist for sponge bathing on posterior aspect of body, husband/daughter do cleaning and meal prep  Comments: Pt reports 0 falls     Hand Dominance   Dominant Hand: Right    Extremity/Trunk Assessment   Upper Extremity Assessment Upper Extremity Assessment: Defer to OT evaluation    Lower Extremity Assessment Lower Extremity Assessment: Generalized weakness;LLE deficits/detail LLE Deficits / Details: radicular pain L lower leg LLE: Unable to fully assess due to pain    Cervical / Trunk Assessment Cervical / Trunk Assessment: Normal  Communication   Communication: No difficulties  Cognition Arousal/Alertness: Awake/alert Behavior During Therapy: WFL for tasks assessed/performed Overall Cognitive Status: Within Functional Limits for tasks assessed                                 General Comments: drowsy, but improved from this am with OT (per pt report). Periods of eye closing during gait, when PT cued pt to open her eyes pt states "I always do this"      General Comments      Exercises General Exercises - Lower Extremity Ankle Circles/Pumps: AROM;Both;10 reps;Supine   Assessment/Plan    PT Assessment Patient needs continued PT services  PT Problem List Decreased strength;Decreased mobility;Decreased activity tolerance;Decreased balance;Decreased knowledge of use of DME;Pain;Decreased safety awareness       PT Treatment Interventions DME instruction;Therapeutic activities;Gait training;Therapeutic exercise;Patient/family education;Balance training;Functional mobility training;Neuromuscular re-education    PT Goals (Current goals can be found in the Care Plan section)  Acute Rehab PT Goals Patient Stated Goal: stay in the hospital until I get stronger PT Goal Formulation: With patient Time For Goal Achievement: 05/09/20 Potential to Achieve Goals: Good    Frequency Min 5X/week   Barriers to discharge        Co-evaluation                AM-PAC PT "6 Clicks" Mobility  Outcome Measure Help needed turning from your back to your side while in a flat bed without using bedrails?: A Little Help needed moving from lying on your back to sitting on the side of a flat bed without using bedrails?: A Little Help needed moving to and from a bed to a chair (including a wheelchair)?: A Little Help needed standing up from a chair using your arms (e.g., wheelchair or bedside chair)?: A Little Help needed to walk in hospital room?: A Little Help needed climbing 3-5 steps with a railing? : A Little 6 Click Score: 18    End of Session Equipment Utilized During Treatment: Back brace Activity Tolerance: Patient limited by fatigue;Patient limited by pain Patient left: in bed;with call bell/phone within reach Nurse Communication: Mobility status PT Visit Diagnosis: Other abnormalities of gait and mobility (R26.89);Difficulty in walking, not elsewhere classified (R26.2)    Time: 0940-1000 PT Time Calculation (min) (ACUTE ONLY): 20 min   Charges:   PT Evaluation $PT Eval Low Complexity: 1 Low          Savahna Casados E, PT Acute Rehabilitation Services Pager [phone]  Office [phone]    Yena Tisby D Cynai Skeens 04/25/2020, 10:19 AM

## 2020-04-25 NOTE — Progress Notes (Signed)
CRITICAL VALUE ALERT  Critical Value:  hgb - 6.9  Date & Time Notied:  04/25/2020, 0605  Provider Notified: Osie Cheeks NP  Orders Received/Actions taken: will monitor for now. To f/u with Dr Arnoldo Morale.

## 2020-04-25 NOTE — Evaluation (Signed)
Occupational Therapy Evaluation Patient Details Name: [name] MRN: [id number] DOB: Mar 26, 1930 Today's Date: 04/25/2020    History of Present Illness 86 yo female s/p bilateral redo L2-5 laminectomy/foraminotomy and medial facetectomy, L2-5 transforaminal lumbar interbody fusion on 10/18. PMH includes anemia, OA, breast cancer s/p radiation and R lumpectomy, CKD, DDD, UTIs, HLD, RBBB, DMII, R shoulder arthroplasty, bilat TKR.   Clinical Impression   Patient is s/p L2-5 laminectomy/ foraminotomy fusion surgery resulting in functional limitations due to the deficits listed below (see OT problem list). Pt very drowzy and limited evaluation. Pt requires (A) for all transfers and adls. Pt will need max (A) for all LB dressing.  Patient will benefit from skilled OT acutely to increase independence and safety with ADLS to allow discharge [place].     Follow Up Recommendations  Home health OT    Equipment Recommendations  None recommended by OT    Recommendations for Other Services       Precautions / Restrictions Precautions Precautions: Fall;Back Precaution Booklet Issued: Yes (comment) Precaution Comments: back precautions , back brace Required Braces or Orthoses: Spinal Brace Spinal Brace: Lumbar corset;Applied in sitting position Restrictions Weight Bearing Restrictions: No      Mobility Bed Mobility Overal bed mobility: Needs Assistance Bed Mobility: Supine to Sit Rolling: Max assist   Supine to sit: Max assist   Sit to sidelying: Min assist General bed mobility comments: requires use of pad to pivot to eob. pt needs mod (A) initially to sit EOB. pt able to progress to static sitting  Transfers Overall transfer level: Needs assistance Equipment used: Rolling walker (2 wheeled) Transfers: Sit to/from Stand Sit to Stand: Mod assist;From elevated surface         General transfer comment: pt with bed elevated to help with transfer and make sit<>Stand easier due to  no brace present. Pt requires Max (A) to descend to chair at the bathroom doorway from standing    Balance Overall balance assessment: Needs assistance Sitting-balance support: Bilateral upper extremity supported;Feet supported Sitting balance-Leahy Scale: Fair     Standing balance support: Bilateral upper extremity supported;During functional activity Standing balance-Leahy Scale: Poor Standing balance comment: reliant on support to stabilize static standing for adl task                           ADL either performed or assessed with clinical judgement   ADL Overall ADL's : Needs assistance/impaired Eating/Feeding: Minimal assistance;Bed level Eating/Feeding Details (indicate cue type and reason): sleeping with utensil in hand, drooling and unaware falling asleep Grooming: Wash/dry face;Moderate assistance Grooming Details (indicate cue type and reason): pt at sink sitting on OT leg for stability, pt unabel to static stand to wash face Upper Body Bathing: Moderate assistance   Lower Body Bathing: Total assistance   Upper Body Dressing : Maximal assistance Upper Body Dressing Details (indicate cue type and reason): unable to don brace , total (A)  Lower Body Dressing: Total assistance   Toilet Transfer: Moderate assistance Toilet Transfer Details (indicate cue type and reason): elevated surface to help elevate from bed surface         Functional mobility during ADLs: Moderate assistance;Rolling walker General ADL Comments: pt very drowsy and affecting ability to participate in session     Vision Baseline Vision/History: Wears glasses Wears Glasses: At all times       Perception     Praxis      Pertinent Vitals/Pain Pain Assessment:  Faces Faces Pain Scale: Hurts even more Pain Location: back, L lower leg Pain Descriptors / Indicators: Sore;Discomfort Pain Intervention(s): Monitored during session;Premedicated before session;Repositioned     Hand  Dominance Right   Extremity/Trunk Assessment Upper Extremity Assessment Upper Extremity Assessment: Generalized weakness   Lower Extremity Assessment Lower Extremity Assessment: Defer to PT evaluation LLE Deficits / Details: radicular pain L lower leg LLE: Unable to fully assess due to pain   Cervical / Trunk Assessment Cervical / Trunk Assessment: Other exceptions Cervical / Trunk Exceptions: s/p surgery- dressing wet and RN notified   Communication Communication Communication: No difficulties   Cognition Arousal/Alertness: Awake/alert Behavior During Therapy: Flat affect Overall Cognitive Status: Difficult to assess                                 General Comments: drowsy making self corrections to answers provided supine once sitting eob. Pt reports she sits in the bottom of the tub but once more awake at EOB reprots no no i sponge bath. Pt reports "i wish i could sleep like this last night not now"   General Comments       Exercises General Exercises - Lower Extremity Ankle Circles/Pumps: AROM;Both;10 reps;Supine   Shoulder Instructions      Home Living Family/patient expects to be discharged to:: Private residence Living Arrangements: Spouse/significant other Available Help at Discharge: Family;Available 24 hours/day Type of Home: House Home Access: Ramped entrance     Home Layout: One level     Bathroom Shower/Tub: Occupational psychologist: Handicapped height     Home Equipment: Environmental consultant - 2 wheels;Bedside commode;Shower seat   Additional Comments: reports daughter can help a few days      Prior Functioning/Environment Level of Independence: Needs assistance  Gait / Transfers Assistance Needed: pt reports walking with RW since d/c from SNF a few months ago ADL's / Homemaking Assistance Needed: Pt reports assist for sponge bathing on posterior aspect of body, husband/daughter do cleaning and meal prep   Comments: Pt reports 0 falls         OT Problem List: Decreased activity tolerance;Impaired balance (sitting and/or standing);Decreased knowledge of use of DME or AE;Decreased knowledge of precautions;Pain      OT Treatment/Interventions: Self-care/ADL training;Therapeutic exercise;Neuromuscular education;Energy conservation;DME and/or AE instruction;Manual therapy;Therapeutic activities;Cognitive remediation/compensation;Patient/family education;Balance training    OT Goals(Current goals can be found in the care plan section) Acute Rehab OT Goals Patient Stated Goal: to wake up today OT Goal Formulation: Patient unable to participate in goal setting Time For Goal Achievement: 05/09/20 Potential to Achieve Goals: Good  OT Frequency: Min 3X/week   Barriers to D/C: Decreased caregiver support  uncertain of daughter ability to (A) patient       Co-evaluation              AM-PAC OT "6 Clicks" Daily Activity     Outcome Measure Help from another person eating meals?: A Little Help from another person taking care of personal grooming?: A Little Help from another person toileting, which includes using toliet, bedpan, or urinal?: A Lot Help from another person bathing (including washing, rinsing, drying)?: A Lot Help from another person to put on and taking off regular upper body clothing?: A Little Help from another person to put on and taking off regular lower body clothing?: A Lot 6 Click Score: 15   End of Session Equipment Utilized During Treatment: Back brace;Rolling  walker Nurse Communication: Mobility status;Precautions  Activity Tolerance: Patient tolerated treatment well Patient left: in chair;with call bell/phone within reach;Other (comment) (brace pending arrival and arriving at end of session)  OT Visit Diagnosis: Unsteadiness on feet (R26.81);Muscle weakness (generalized) (M62.81);Pain                Time: 3149-7026 OT Time Calculation (min): 20 min Charges:  OT General Charges $OT Visit: 1  Visit OT Evaluation $OT Eval Moderate Complexity: 1 Mod   Brynn, OTR/L  Acute Rehabilitation Services Pager: [phone] Office: [phone] .   Jeri Modena 04/25/2020, 11:41 AM

## 2020-04-26 ENCOUNTER — Ambulatory Visit: Payer: Self-pay | Admitting: *Deleted

## 2020-04-26 LAB — CBC WITH DIFFERENTIAL/PLATELET
Abs Immature Granulocytes: 0.04 10*3/uL (ref 0.00–0.07)
Basophils Absolute: 0 10*3/uL (ref 0.0–0.1)
Basophils Relative: 0 %
Eosinophils Absolute: 0 10*3/uL (ref 0.0–0.5)
Eosinophils Relative: 1 %
HCT: 23.7 % — ABNORMAL LOW (ref 36.0–46.0)
Hemoglobin: 7.5 g/dL — ABNORMAL LOW (ref 12.0–15.0)
Immature Granulocytes: 1 %
Lymphocytes Relative: 21 %
Lymphs Abs: 1.7 10*3/uL (ref 0.7–4.0)
MCH: 29.3 pg (ref 26.0–34.0)
MCHC: 31.6 g/dL (ref 30.0–36.0)
MCV: 92.6 fL (ref 80.0–100.0)
Monocytes Absolute: 0.7 10*3/uL (ref 0.1–1.0)
Monocytes Relative: 8 %
Neutro Abs: 5.8 10*3/uL (ref 1.7–7.7)
Neutrophils Relative %: 69 %
Platelets: 163 10*3/uL (ref 150–400)
RBC: 2.56 MIL/uL — ABNORMAL LOW (ref 3.87–5.11)
RDW: 14.2 % (ref 11.5–15.5)
WBC: 8.3 10*3/uL (ref 4.0–10.5)
nRBC: 0 % (ref 0.0–0.2)

## 2020-04-26 LAB — GLUCOSE, CAPILLARY
Glucose-Capillary: 134 mg/dL — ABNORMAL HIGH (ref 70–99)
Glucose-Capillary: 148 mg/dL — ABNORMAL HIGH (ref 70–99)
Glucose-Capillary: 163 mg/dL — ABNORMAL HIGH (ref 70–99)
Glucose-Capillary: 154 mg/dL — ABNORMAL HIGH (ref 70–99)

## 2020-04-26 MED ORDER — HYDROCODONE-ACETAMINOPHEN 5-325 MG PO TABS
1.0000 | ORAL_TABLET | ORAL | Status: DC | PRN
  Administered 2020-04-29 – 2020-05-02 (×11): 2 via ORAL
  Filled 2020-04-26 (×13): qty 2

## 2020-04-26 MED FILL — Heparin Sodium (Porcine) Inj 1000 Unit/ML: INTRAMUSCULAR | Qty: 30 | Status: AC

## 2020-04-26 MED FILL — Sodium Chloride IV Soln 0.9%: INTRAVENOUS | Qty: 1000 | Status: AC

## 2020-04-26 NOTE — Progress Notes (Signed)
Subjective: The patient is alert and pleasant.  She sitting in her bedside chair.  Her daughter is at the bedside.  She feels more sore than yesterday.  She is interested in rehab.  Objective: Vital signs in last 24 hours: Temp:  [98.2 F (36.8 C)-98.9 F (37.2 C)] 98.5 F (36.9 C) (10/20 0740) Pulse Rate:  [96-106] 102 (10/20 0740) Resp:  [18] 18 (10/20 0740) BP: (98-120)/(41-52) 100/41 (10/20 0740) SpO2:  [91 %-96 %] 92 % (10/20 0740) Estimated body mass index is 28.24 kg/m as calculated from the following:   Height as of this encounter: 5' 0.5" (1.537 m).   Weight as of this encounter: 66.7 kg.   Intake/Output from previous day: 10/19 0701 - 10/20 0700 In: -  Out: 1550 [Urine:1550] Intake/Output this shift: No intake/output data recorded.  Physical exam the patient is alert and pleasant.  He is moving her lower extremities well.  Lab Results: Recent Labs    04/25/20 0433 04/26/20 0736  WBC 6.9 8.3  HGB 6.9* 7.5*  HCT 21.5* 23.7*  PLT 144* 163   BMET Recent Labs    04/25/20 0433  NA 138  K 3.7  CL 103  CO2 25  GLUCOSE 174*  BUN 17  CREATININE 1.28*  CALCIUM 8.4*    Studies/Results: DG Lumbar Spine 2-3 Views  Result Date: 04/24/2020 CLINICAL DATA:  L2-L5 PLIF EXAM: LUMBAR SPINE - 2-3 VIEW; DG C-ARM 1-60 MIN COMPARISON:  12/25/2019 FLUOROSCOPY TIME:  1 minutes 6 seconds Images: 2 FINDINGS: Five lumbar vertebra by prior radiographs. Images demonstrate BILATERAL pedicle screws at L2, L3, L4, and L5 with intervening disc prostheses. Diffuse osseous demineralization. Anterolisthesis at L4-L5 again seen. Linear density projecting over the operative field question sponge. IMPRESSION: Intraoperative images during posterior L2-L5 fusion. Electronically Signed   By: Lavonia Dana M.D.   On: 04/24/2020 15:51   DG Lumbar Spine 1 View  Result Date: 04/24/2020 CLINICAL DATA:  Localization for lumbar fusion EXAM: LUMBAR SPINE - 1 VIEW COMPARISON:  None. FINDINGS:  Cross-table lateral view demonstrates localizer dorsal to the L2-L3 disc level. Subsequent image demonstrates localizer is at L2-L3 and L3-L4 disc spaces. IMPRESSION: Intraoperative localization. Electronically Signed   By: Macy Mis M.D.   On: 04/24/2020 15:41   DG Lumbar Spine 1 View  Result Date: 04/24/2020 CLINICAL DATA:  Laminectomy with posterior lumbar interbody fusion L2 through L5. EXAM: LUMBAR SPINE - 1 VIEW COMPARISON:  Lumbar spine radiographs 12/25/2019 and 02/18/2020. Intraoperative radiograph earlier the same date. FINDINGS: Cross-table lateral view at 1045 hours labeled image 2. Prior studies demonstrate 5 lumbar type vertebral bodies. There is a grade 1 anterolisthesis with disc space narrowing at L3-4 and L4-5. Skin spreaders are present posteriorly with a surgical instrument directed over the L3 pedicles. IMPRESSION: Intraoperative localization as described. Electronically Signed   By: Richardean Sale M.D.   On: 04/24/2020 11:06   DG C-Arm 1-60 Min  Result Date: 04/24/2020 CLINICAL DATA:  L2-L5 PLIF EXAM: LUMBAR SPINE - 2-3 VIEW; DG C-ARM 1-60 MIN COMPARISON:  12/25/2019 FLUOROSCOPY TIME:  1 minutes 6 seconds Images: 2 FINDINGS: Five lumbar vertebra by prior radiographs. Images demonstrate BILATERAL pedicle screws at L2, L3, L4, and L5 with intervening disc prostheses. Diffuse osseous demineralization. Anterolisthesis at L4-L5 again seen. Linear density projecting over the operative field question sponge. IMPRESSION: Intraoperative images during posterior L2-L5 fusion. Electronically Signed   By: Lavonia Dana M.D.   On: 04/24/2020 15:51    Assessment/Plan: Postop day #2: The  patient is more sore as expected postop day #2.  I will ask rehab to see her.  Acute blood loss anemia: Her hemoglobin is better today.  It looks like it was partially delusional.  She is not symptomatic so does not need a transfusion.  LOS: 2 days     Ophelia Charter 04/26/2020, 10:39  AM

## 2020-04-26 NOTE — Progress Notes (Signed)
Physical Therapy Treatment Patient Details Name: [name] MRN: [id number] DOB: [birth year] Today's Date: 04/26/2020    History of Present Illness 86 yo female s/p bilateral redo L2-5 laminectomy/foraminotomy and medial facetectomy, L2-5 transforaminal lumbar interbody fusion on 10/18. PMH includes anemia, OA, breast cancer s/p radiation and R lumpectomy, CKD, DDD, UTIs, HLD, RBBB, DMII, R shoulder arthroplasty, bilat TKR.    PT Comments    Pt progressing slowly towards physical therapy goals. She was able to demonstrate transfers and ambulation with min assist and RW for support. Noted several instances of knee buckling during standing activity. Pt and daughter were educated on precautions, brace application/wearing schedule, appropriate activity progression, and car transfer. Encouraged daughter to plan for 24 hour support initially on return home as pt is requiring assist for both walker management and balance. Pt's daughter reports that her father, the pt's spouse is not in good physical condition where he could assist her physically around the house if needed. Will continue to follow.      Follow Up Recommendations  Home health PT;Supervision/Assistance - 24 hour     Equipment Recommendations  None recommended by PT    Recommendations for Other Services       Precautions / Restrictions Precautions Precautions: Fall;Back Precaution Booklet Issued: Yes (comment) Precaution Comments: Limited recall of precautions. By end of session able to recall 2/3 precautions. Daughter present for education as well.  Required Braces or Orthoses: Spinal Brace Spinal Brace: Lumbar corset;Applied in sitting position Restrictions Weight Bearing Restrictions: No    Mobility  Bed Mobility               General bed mobility comments: Pt was received sitting up in the recliner chair.   Transfers Overall transfer level: Needs assistance Equipment used: Rolling walker (2  wheeled) Transfers: Sit to/from Stand Sit to Stand: Min assist         General transfer comment: VC's for hand placement on seated surface for safety. Pt with difficulty scooting out to prepare for stand, and with increased effort to transition hands from arm rests of chair to the walker.   Ambulation/Gait Ambulation/Gait assistance: Min guard;Min assist Gait Distance (Feet): 35 Feet Assistive device: Rolling walker (2 wheeled) Gait Pattern/deviations: Step-through pattern;Decreased stride length;Shuffle;Trunk flexed Gait velocity: Decreased Gait velocity interpretation: <1.31 ft/sec, indicative of household ambulator General Gait Details: Min guard with occasional min assist provided for knee buckling episodes. Pt reports difficulty advancing the RW and assist provided for pain control - especially during turns.    Stairs             Wheelchair Mobility    Modified Rankin (Stroke Patients Only)       Balance Overall balance assessment: Needs assistance Sitting-balance support: Bilateral upper extremity supported;Feet supported Sitting balance-Leahy Scale: Fair     Standing balance support: Bilateral upper extremity supported;During functional activity Standing balance-Leahy Scale: Poor Standing balance comment: Reliant on RW for support                            Cognition Arousal/Alertness: Lethargic Behavior During Therapy: Flat affect Overall Cognitive Status: Difficult to assess                                 General Comments: Limited verbalizations throughout session.       Exercises      General Comments General comments (  skin integrity, edema, etc.): pt dtr present during session stating she can assist pt as needed at home and can be there "as much as she needs to be" pt reports having reacher and sock aid at home.      Pertinent Vitals/Pain Pain Assessment: 0-10 Pain Score: 9  Pain Location: back Pain Descriptors /  Indicators: Sore;Discomfort;Operative site guarding Pain Intervention(s): Limited activity within patient's tolerance;Monitored during session;Premedicated before session;Repositioned    Home Living                      Prior Function            PT Goals (current goals can now be found in the care plan section) Acute Rehab PT Goals Patient Stated Goal: Be able to manage with her husband at home.  PT Goal Formulation: With patient/family Time For Goal Achievement: 05/09/20 Potential to Achieve Goals: Good Progress towards PT goals: Progressing toward goals    Frequency    Min 5X/week      PT Plan Current plan remains appropriate    Co-evaluation              AM-PAC PT "6 Clicks" Mobility   Outcome Measure  Help needed turning from your back to your side while in a flat bed without using bedrails?: A Little Help needed moving from lying on your back to sitting on the side of a flat bed without using bedrails?: A Little Help needed moving to and from a bed to a chair (including a wheelchair)?: A Little Help needed standing up from a chair using your arms (e.g., wheelchair or bedside chair)?: A Little Help needed to walk in hospital room?: A Little Help needed climbing 3-5 steps with a railing? : A Little 6 Click Score: 18    End of Session Equipment Utilized During Treatment: Back brace;Gait belt Activity Tolerance: Patient limited by fatigue;Patient limited by pain Patient left: in bed;with call bell/phone within reach Nurse Communication: Mobility status PT Visit Diagnosis: Other abnormalities of gait and mobility (R26.89);Difficulty in walking, not elsewhere classified (R26.2)     Time: 9826-4158 PT Time Calculation (min) (ACUTE ONLY): 22 min  Charges:  $Gait Training: 8-22 mins                     Rolinda Roan, PT, DPT Acute Rehabilitation Services Pager: [phone] Office: [phone]    [name] 04/26/2020, 10:54 AM

## 2020-04-26 NOTE — Progress Notes (Signed)
Occupational Therapy Treatment Patient Details Name: [name] MRN: [id number] DOB: Aug 15, 1929 Today's Date: 04/26/2020    History of present illness 86 yo female s/p bilateral redo L2-5 laminectomy/foraminotomy and medial facetectomy, L2-5 transforaminal lumbar interbody fusion on 10/18. PMH includes anemia, OA, breast cancer s/p radiation and R lumpectomy, CKD, DDD, UTIs, HLD, RBBB, DMII, R shoulder arthroplasty, bilat TKR.   OT comments  Pt making gradual progress towards OT goals this session. Pt asleep in recliner upon OTA arrival but agreeable to OT intervention, pt very lethargic throughout session with dtr stating she always talks with her eyes closed. Pt continues to present with pain, lethargy, decreased activity tolerance and decreased ability to care for self impacting pts ability to complete BADLs. Pt required MIN A for functional mobility from recliner>bathroom with RW. Pt noted to keep eyes closed during mobility. Once pt at sink, pt noted to heavily lean on sink with pt unable to maintain upright position while completing UB ADLs, deferred further ADLs to sitting in recliner to maintain back precautions. Pt reports having reacher and sock aid at home. Practiced using reacher for LB dressing with pt needing MAX A overall to sequence using reacher and to pull pants up to waist line in standing as pt needs BUE support in standing. Pt needed frequent reminders to recall back precautions during session. Education provided on available DME to complete posterior pericare with pt and dtr verbalizing understanding. Pt would continue to benefit from skilled occupational therapy while admitted and after d/c to address the below listed limitations in order to improve overall functional mobility and facilitate independence with BADL participation. DC plan remains appropriate, will follow acutely per POC.     Follow Up Recommendations  Home health OT    Equipment Recommendations  None  recommended by OT    Recommendations for Other Services      Precautions / Restrictions Precautions Precautions: Fall;Back Precaution Booklet Issued: Yes (comment) Precaution Comments: pt unable to recall any precautions at start of session, able to recall with increased education and demo Required Braces or Orthoses: Spinal Brace Spinal Brace: Lumbar corset;Applied in sitting position Restrictions Weight Bearing Restrictions: No       Mobility Bed Mobility               General bed mobility comments: pt OOB in recliner, pt reports hopsital bed at home  Transfers Overall transfer level: Needs assistance Equipment used: Rolling walker (2 wheeled) Transfers: Sit to/from Stand Sit to Stand: Min assist         General transfer comment: pt sit<>stand x3 during session with MIN A to power up and cues for hand placement, pt required cues to reach back when descending onto chair as pt stating " am I there yet?" when trying to sit in recliner    Balance Overall balance assessment: Needs assistance Sitting-balance support: Bilateral upper extremity supported;Feet supported Sitting balance-Leahy Scale: Fair     Standing balance support: Bilateral upper extremity supported;During functional activity Standing balance-Leahy Scale: Poor Standing balance comment: reliant on support to stabilize static standing for adl task                           ADL either performed or assessed with clinical judgement   ADL Overall ADL's : Needs assistance/impaired Eating/Feeding: Supervision/ safety;Sitting Eating/Feeding Details (indicate cue type and reason): supervision for safety as pt continues to be lethargic although dtr states pt always talks with her  eyes closed Grooming: Supervision/safety;Set up;Sitting;Standing Grooming Details (indicate cue type and reason): education on not bending for ADLs, pt attempted to stand at sink for oral care but noted to lean on sink.  deferred oral care to sitting in recliner.       Lower Body Bathing Details (indicate cue type and reason): education provided on not bending, however pt states she does sponge baths Upper Body Dressing : Sitting;Total assistance Upper Body Dressing Details (indicate cue type and reason): unable to don brace , total (A)  Lower Body Dressing: Maximal assistance;Sit to/from stand;Cueing for safety;Cueing for sequencing;With adaptive equipment Lower Body Dressing Details (indicate cue type and reason): pt able to don pants with reacher with MAX A. pt required cues for sequencing of task and overall technique. pt reports she prefers to wear gowns at home Toilet Transfer: Minimal assistance;RW;Ambulation Toilet Transfer Details (indicate cue type and reason): cues for RW mgmt and cues to remind pt to keep eyes open during ambulation. pt reports she rarely sits on the toilet d/t her catheter and wearing diaper.   Toileting - Clothing Manipulation Details (indicate cue type and reason): education and visual demo of maintaining back precautions during posterior pericare. pt able to demo lateral leans from recliner but would likely need MAX A for cleanliness. issued dtr and pt education on availbale DME for pericare with pt and dtr verbalizing understanding   Tub/Shower Transfer Details (indicate cue type and reason): pt reports completing sponge baths at home Functional mobility during ADLs: Minimal assistance;Cueing for safety;Rolling walker General ADL Comments: pt continues to be limited by lethargy, pain, back precautions and decreased ability to care for self impacting pts ability to complete BADLs     Vision Baseline Vision/History: Wears glasses Wears Glasses: At all times     Perception     Praxis      Cognition Arousal/Alertness: Lethargic Behavior During Therapy: Flat affect Overall Cognitive Status: Difficult to assess                                 General  Comments: difficult to assess d/t pts level of arousal although pts dtr reports she always talks with her eyes closed. Pt overall slow to process and required frequent cues to recall back precautions.         Exercises     Shoulder Instructions       General Comments pt dtr present during session stating she can assist pt as needed at home and can be there "as much as she needs to be" pt reports having reacher and sock aid at home.    Pertinent Vitals/ Pain       Pain Assessment: 0-10 Pain Score: 8  Pain Location: back Pain Descriptors / Indicators: Sore;Discomfort;Operative site guarding Pain Intervention(s): Limited activity within patient's tolerance;Monitored during session;Repositioned;RN gave pain meds during session  Home Living                                          Prior Functioning/Environment              Frequency  Min 3X/week        Progress Toward Goals  OT Goals(current goals can now be found in the care plan section)  Progress towards OT goals: Progressing toward goals  Acute Rehab OT Goals Patient  Stated Goal: to wake up today OT Goal Formulation: Patient unable to participate in goal setting Time For Goal Achievement: 05/09/20 Potential to Achieve Goals: Good  Plan Discharge plan remains appropriate;Frequency remains appropriate    Co-evaluation                 AM-PAC OT "6 Clicks" Daily Activity     Outcome Measure   Help from another person eating meals?: A Little Help from another person taking care of personal grooming?: A Little Help from another person toileting, which includes using toliet, bedpan, or urinal?: A Lot Help from another person bathing (including washing, rinsing, drying)?: A Lot Help from another person to put on and taking off regular upper body clothing?: A Lot Help from another person to put on and taking off regular lower body clothing?: A Lot 6 Click Score: 14    End of Session  Equipment Utilized During Treatment: Back brace;Rolling walker  OT Visit Diagnosis: Unsteadiness on feet (R26.81);Muscle weakness (generalized) (M62.81);Pain   Activity Tolerance Patient limited by lethargy   Patient Left in chair;with call bell/phone within reach;with family/visitor present   Nurse Communication Mobility status        Time: 6314-9702 OT Time Calculation (min): 42 min  Charges: OT General Charges $OT Visit: 1 Visit OT Treatments $Self Care/Home Management : 38-52 mins  Lanier Clam., COTA/L Acute Rehabilitation Services [phone]   Ihor Gully 04/26/2020, 9:00 AM

## 2020-04-26 NOTE — Progress Notes (Signed)
Inpatient Rehab Admissions:  Inpatient Rehab Consult received.  I met with patient and her daughter at the bedside for rehabilitation assessment and to discuss goals and expectations of an inpatient rehab admission.  Pt keeps eyes closed, but answers all questions appropriately.  Appears she has not been as mobile since d/c from SNF in July 2/2 pain and deconditioning.  Therapy recommending home health, and pt does have 24/7 assist from her daughter available.  Spouse able to provide some supervision, but no physical assist.  Feel pt could be appropriate for CIR from a functional standpoint, will discuss with rehab MD regarding medical necessity.  Will continue to follow for possible admission this week pending rehab MD input and bed availability.   Signed: Shann Medal, PT, DPT Admissions Coordinator [phone] 04/26/20  4:19 PM

## 2020-04-27 LAB — GLUCOSE, CAPILLARY: Glucose-Capillary: 156 mg/dL — ABNORMAL HIGH (ref 70–99)

## 2020-04-27 NOTE — Progress Notes (Signed)
Inpatient Rehab Admissions Coordinator:   Discussed with rehab MD, Dr. Naaman Plummer, who feels that given pt's post-op anemia and hypotension, would be medically appropriate for CIR.  I do not have a bed available for this patient to admit today, but will continue to follow for timing of possible admission pending bed availability in the next 1-2 days.   Shann Medal, PT, DPT Admissions Coordinator [phone] 04/27/20  9:53 AM

## 2020-04-27 NOTE — Progress Notes (Signed)
Report given to 5N RN- Blanch Media.

## 2020-04-27 NOTE — Progress Notes (Signed)
Subjective: The patient is somnolent but arousable.  Her daughter is at the bedside.  Objective: Vital signs in last 24 hours: Temp:  [98.3 F (36.8 C)-99.7 F (37.6 C)] 98.6 F (37 C) (10/21 0932) Pulse Rate:  [80-110] 80 (10/21 0932) Resp:  [16-18] 18 (10/21 0932) BP: (87-117)/(39-76) 108/42 (10/21 0932) SpO2:  [93 %-98 %] 96 % (10/21 0932) Estimated body mass index is 28.24 kg/m as calculated from the following:   Height as of this encounter: 5' 0.5" (1.537 m).   Weight as of this encounter: 66.7 kg.   Intake/Output from previous day: 10/20 0701 - 10/21 0700 In: 240 [P.O.:240] Out: 1100 [Urine:1100] Intake/Output this shift: No intake/output data recorded.  Physical exam the patient is alert and pleasant.  She is moving her lower extremities well.  Her dressing is clean and dry.  The drain dressing has a small amount of old blood.  Lab Results: Recent Labs    04/25/20 0433 04/26/20 0736  WBC 6.9 8.3  HGB 6.9* 7.5*  HCT 21.5* 23.7*  PLT 144* 163   BMET Recent Labs    04/25/20 0433  NA 138  K 3.7  CL 103  CO2 25  GLUCOSE 174*  BUN 17  CREATININE 1.28*  CALCIUM 8.4*    Studies/Results: No results found.  Assessment/Plan: Postop day #3: I have encouraged the patient to mobilize with PT and OT so she can go to inpatient rehab.  Hopefully she will be able to go tomorrow.  I have answered all her questions.  LOS: 3 days     [name] 04/27/2020, 3:09 PM

## 2020-04-27 NOTE — Progress Notes (Signed)
Physical Therapy Treatment Patient Details Name: [name] MRN: [id number] DOB: 1929/10/24 Today's Date: 04/27/2020    History of Present Illness 86 yo female s/p bilateral redo L2-5 laminectomy/foraminotomy and medial facetectomy, L2-5 transforaminal lumbar interbody fusion on 10/18. PMH includes anemia, OA, breast cancer s/p radiation and R lumpectomy, CKD, DDD, UTIs, HLD, RBBB, DMII, R shoulder arthroplasty, bilat TKR.    PT Comments    Pt progressing well with post-op mobility. She was able to demonstrate transfers and ambulation with gross min assist and RW for support. Car transfer practiced with transfer chair and pt required assist and significant increased time to complete a simulated entrance and exit to a car. Pt was educated on precautions, brace application/wearing schedule, appropriate activity progression, and positioning recommendations. Will continue to follow.      Follow Up Recommendations  CIR;Supervision/Assistance - 24 hour     Equipment Recommendations  None recommended by PT    Recommendations for Other Services       Precautions / Restrictions Precautions Precautions: Fall;Back Precaution Booklet Issued: Yes (comment) Precaution Comments: Requires cues throughout session for precautions Required Braces or Orthoses: Spinal Brace Spinal Brace: Lumbar corset;Applied in sitting position Restrictions Weight Bearing Restrictions: No    Mobility  Bed Mobility   Bed Mobility: Supine to Sit;Rolling Rolling: Min assist   Supine to sit: Mod assist     General bed mobility comments: Pt was received sitting up in the recliner.   Transfers Overall transfer level: Needs assistance Equipment used: Rolling walker (2 wheeled) Transfers: Sit to/from [facility] Sit to Stand: Min assist Stand pivot transfers: Min assist       General transfer comment: VC's for hand placement on seated surface for safety. Pt with difficulty scooting out  to prepare for stand, and with increased effort to transition hands from arm rests of chair to the walker.   Ambulation/Gait Ambulation/Gait assistance: Min guard;Min assist Gait Distance (Feet): 50 Feet Assistive device: Rolling walker (2 wheeled) Gait Pattern/deviations: Step-through pattern;Decreased stride length;Shuffle;Trunk flexed Gait velocity: Decreased Gait velocity interpretation: <1.31 ft/sec, indicative of household ambulator General Gait Details: Min guard with occasional min assist provided for knee buckling episodes. Pt reports difficulty advancing the RW and assist provided for pain control - especially during turns.    Stairs             Wheelchair Mobility    Modified Rankin (Stroke Patients Only)       Balance Overall balance assessment: Needs assistance Sitting-balance support: Bilateral upper extremity supported;Feet supported Sitting balance-Leahy Scale: Fair     Standing balance support: Bilateral upper extremity supported;During functional activity Standing balance-Leahy Scale: Poor Standing balance comment: Reliant on RW for support                            Cognition Arousal/Alertness: Lethargic Behavior During Therapy: Flat affect Overall Cognitive Status: Difficult to assess                                 General Comments: Limited verbalizations throughout session with eyes closed often.      Exercises      General Comments        Pertinent Vitals/Pain Pain Assessment: Faces Faces Pain Scale: Hurts whole lot Pain Location: back and sides from coughing Pain Descriptors / Indicators: Sore;Discomfort;Operative site guarding Pain Intervention(s): Limited activity within patient's tolerance;Monitored during  session;Repositioned    Home Living                      Prior Function            PT Goals (current goals can now be found in the care plan section) Acute Rehab PT Goals Patient Stated  Goal: Get to where I'm able to do more for myself PT Goal Formulation: With patient Time For Goal Achievement: 05/09/20 Potential to Achieve Goals: Good Progress towards PT goals: Progressing toward goals    Frequency    Min 5X/week      PT Plan Discharge plan needs to be updated    Co-evaluation              AM-PAC PT "6 Clicks" Mobility   Outcome Measure  Help needed turning from your back to your side while in a flat bed without using bedrails?: A Little Help needed moving from lying on your back to sitting on the side of a flat bed without using bedrails?: A Little Help needed moving to and from a bed to a chair (including a wheelchair)?: A Little Help needed standing up from a chair using your arms (e.g., wheelchair or bedside chair)?: A Little Help needed to walk in hospital room?: A Little Help needed climbing 3-5 steps with a railing? : A Little 6 Click Score: 18    End of Session Equipment Utilized During Treatment: Back brace;Gait belt Activity Tolerance: Patient limited by fatigue;Patient limited by pain Patient left: in chair;with call bell/phone within reach;with nursing/sitter in room Nurse Communication: Mobility status PT Visit Diagnosis: Other abnormalities of gait and mobility (R26.89);Difficulty in walking, not elsewhere classified (R26.2)     Time: 3785-8850 PT Time Calculation (min) (ACUTE ONLY): 31 min  Charges:  $Gait Training: 23-37 mins                     Rolinda Roan, PT, DPT Acute Rehabilitation Services Pager: [phone] Office: [phone]    Thelma Comp 04/27/2020, 11:54 AM

## 2020-04-27 NOTE — Progress Notes (Signed)
Patient is transferred from room 3C05 to unit 5N08 at this time. Alert and in stable condition. Transported via chair with all belongings at side.

## 2020-04-27 NOTE — Progress Notes (Signed)
Attempted to call for report to 5 N- unsuccessful. 5 N receiving  RN to call  Back per CN.

## 2020-04-27 NOTE — Progress Notes (Signed)
Occupational Therapy Treatment Patient Details Name: [name] MRN: [id number] DOB: [birth year] Today's Date: 04/27/2020    History of present illness 86 yo female s/p bilateral redo L2-5 laminectomy/foraminotomy and medial facetectomy, L2-5 transforaminal lumbar interbody fusion on 10/18. PMH includes anemia, OA, breast cancer s/p radiation and R lumpectomy, CKD, DDD, UTIs, HLD, RBBB, DMII, R shoulder arthroplasty, bilat TKR.   OT comments  Patient presents with decreased recall of precautions, pain to low back and sides, generalized weakness, knee buckle, decreased stand balance; all of which impact her independence with ADL, functional mobility and toilet skills.  Patient will benefit from continued rehab post acute to regain enough independence to return home with assist as needed from family.  OT to continue to follow in the acute setting.    Follow Up Recommendations  CIR    Equipment Recommendations  None recommended by OT    Recommendations for Other Services      Precautions / Restrictions Precautions Precautions: Fall;Back Precaution Comments: Able to remember 1/3 without cueing. Required Braces or Orthoses: Spinal Brace Spinal Brace: Lumbar corset;Applied in sitting position Restrictions Weight Bearing Restrictions: No       Mobility Bed Mobility   Bed Mobility: Supine to Sit;Rolling Rolling: Min assist   Supine to sit: Mod assist        Transfers Overall transfer level: Needs assistance Equipment used: Rolling walker (2 wheeled) Transfers: Sit to/from [facility] Sit to Stand: Min assist Stand pivot transfers: Min assist                                                       ADL either performed or assessed with clinical judgement   ADL Overall ADL's : Needs assistance/impaired Eating/Feeding: Set up;Bed level   Grooming: Supervision/safety;Set up;Sitting;Standing   Upper Body Bathing: Moderate  assistance   Lower Body Bathing: Total assistance   Upper Body Dressing : Sitting;Total assistance Upper Body Dressing Details (indicate cue type and reason): unable to don brace , total (A)  Lower Body Dressing: Maximal assistance;Sit to/from stand;Cueing for safety;Cueing for sequencing;With adaptive equipment   Toilet Transfer: Minimal assistance;RW;Ambulation           Functional mobility during ADLs: Minimal assistance;Cueing for safety;Rolling walker       Vision Baseline Vision/History: Wears glasses Wears Glasses: At all times Patient Visual Report: No change from baseline                     General Comments      Pertinent Vitals/ Pain       Pain Assessment: Faces Faces Pain Scale: Hurts whole lot Pain Location: back and sides from coughing Pain Descriptors / Indicators: Sore;Discomfort;Operative site guarding Pain Intervention(s): Monitored during session;Repositioned                                                          Frequency  Min 2X/week        Progress Toward Goals  OT Goals(current goals can now be found in the care plan section)  Progress towards OT goals: Progressing toward goals  Acute Rehab OT Goals Patient Stated Goal:  Get to where I'm able to do more for myself OT Goal Formulation: With patient Time For Goal Achievement: 05/09/20 Potential to Achieve Goals: Good  Plan Discharge plan remains appropriate;Frequency needs to be updated    Co-evaluation                 AM-PAC OT "6 Clicks" Daily Activity     Outcome Measure   Help from another person eating meals?: None Help from another person taking care of personal grooming?: A Little Help from another person toileting, which includes using toliet, bedpan, or urinal?: A Lot Help from another person bathing (including washing, rinsing, drying)?: A Lot Help from another person to put on and taking off regular upper body clothing?: A  Lot Help from another person to put on and taking off regular lower body clothing?: A Lot 6 Click Score: 15    End of Session Equipment Utilized During Treatment: Back brace;Rolling walker  OT Visit Diagnosis: Unsteadiness on feet (R26.81);Muscle weakness (generalized) (M62.81);Pain   Activity Tolerance     Patient Left in chair;with call bell/phone within reach   Nurse Communication          Time: [phone] OT Time Calculation (min): 19 min  Charges: OT General Charges $OT Visit: 1 Visit OT Treatments $Self Care/Home Management : 8-22 mins  04/27/2020  Rich, OTR/L  Acute Rehabilitation Services  Office:  [place] 04/27/2020, 8:50 AM

## 2020-04-28 ENCOUNTER — Encounter (HOSPITAL_COMMUNITY): Payer: Self-pay | Admitting: Neurosurgery

## 2020-04-28 LAB — CBC WITH DIFFERENTIAL/PLATELET
Abs Immature Granulocytes: 0.02 10*3/uL (ref 0.00–0.07)
Basophils Absolute: 0 10*3/uL (ref 0.0–0.1)
Basophils Relative: 0 %
Eosinophils Absolute: 0.2 10*3/uL (ref 0.0–0.5)
Eosinophils Relative: 2 %
HCT: 23.7 % — ABNORMAL LOW (ref 36.0–46.0)
Hemoglobin: 7.4 g/dL — ABNORMAL LOW (ref 12.0–15.0)
Immature Granulocytes: 0 %
Lymphocytes Relative: 27 %
Lymphs Abs: 2.2 10*3/uL (ref 0.7–4.0)
MCH: 29 pg (ref 26.0–34.0)
MCHC: 31.2 g/dL (ref 30.0–36.0)
MCV: 92.9 fL (ref 80.0–100.0)
Monocytes Absolute: 0.8 10*3/uL (ref 0.1–1.0)
Monocytes Relative: 9 %
Neutro Abs: 4.9 10*3/uL (ref 1.7–7.7)
Neutrophils Relative %: 62 %
Platelets: 234 10*3/uL (ref 150–400)
RBC: 2.55 MIL/uL — ABNORMAL LOW (ref 3.87–5.11)
RDW: 14.5 % (ref 11.5–15.5)
WBC: 8.1 10*3/uL (ref 4.0–10.5)
nRBC: 0 % (ref 0.0–0.2)

## 2020-04-28 LAB — BASIC METABOLIC PANEL
Anion gap: 10 (ref 5–15)
BUN: 21 mg/dL (ref 8–23)
CO2: 26 mmol/L (ref 22–32)
Calcium: 8.5 mg/dL — ABNORMAL LOW (ref 8.9–10.3)
Chloride: 100 mmol/L (ref 98–111)
Creatinine, Ser: 1.54 mg/dL — ABNORMAL HIGH (ref 0.44–1.00)
GFR, Estimated: 32 mL/min — ABNORMAL LOW (ref >60)
Glucose, Bld: 124 mg/dL — ABNORMAL HIGH (ref 70–99)
Potassium: 4 mmol/L (ref 3.5–5.1)
Sodium: 136 mmol/L (ref 135–145)

## 2020-04-28 LAB — GLUCOSE, CAPILLARY
Glucose-Capillary: 35 mg/dL — CL (ref 70–99)
Glucose-Capillary: 95 mg/dL (ref 70–99)
Glucose-Capillary: 113 mg/dL — ABNORMAL HIGH (ref 70–99)

## 2020-04-28 MED ORDER — GLUCOSE 40 % PO GEL
ORAL | Status: AC
  Administered 2020-04-28: 37.5 g
  Filled 2020-04-28: qty 1

## 2020-04-28 NOTE — Progress Notes (Signed)
CRITICAL VALUE ALERT  Critical Value:  CBG 35  Date & Time Notied:  04/28/2020 0630  Provider Notified: Rounding MD made aware  Orders Received/Actions taken: Gave dextrose 40% oral gel  Patient's glucose came up to 95

## 2020-04-28 NOTE — Progress Notes (Signed)
Inpatient Diabetes Program Recommendations  AACE/ADA: New Consensus Statement on Inpatient Glycemic Control (2015)  Target Ranges:  Prepandial:   less than 140 mg/dL      Peak postprandial:   less than 180 mg/dL (1-2 hours)      Critically ill patients:  140 - 180 mg/dL   Lab Results  Component Value Date   GLUCAP 95 04/28/2020   HGBA1C 6.6 (H) 03/09/2020   Results for [name], [name] (MRN [id number]) as of 04/28/2020 12:08  Ref. Range 04/26/2020 17:39 04/26/2020 21:52 04/27/2020 06:25 04/28/2020 06:36 04/28/2020 07:23  Glucose-Capillary Latest Ref Range: 70 - 99 mg/dL 163 (H) 154 (H) 156 (H) 35 (LL) 95    Review of Glycemic Control  Diabetes history: type 2 Outpatient Diabetes medications: Glucotrol 10 mg daily (Glucotrol XL 5 mg daily-- not taking) Current orders for Inpatient glycemic control: Glucotrol 10 mg daily, Glucotrol XL 5 mg every evening  Inpatient Diabetes Program Recommendations:   Noted that blood sugar was 35 mg/dl this am.  Recommend discontinuing Glucotrol XL 5 mg every evening. Perhaps discontinue all orals and start Novolog SENSITIVE correction scale TID while in the hospital.  Harvel Ricks RN BSN CDE Diabetes Coordinator Pager: [phone]  8am-5pm

## 2020-04-28 NOTE — Plan of Care (Addendum)
Pt's temp this morning 100.22F, PRN tylenol given, CHG bath done, encouraged IS, adjusted room temp, will continue to monitor.  Per daughter, Pt's urinary catheter is chronic, per MD do not remove foley.  Problem: Activity: Goal: Ability to avoid complications of mobility impairment will improve Outcome: Progressing   Problem: Clinical Measurements: Goal: Ability to maintain clinical measurements within normal limits will improve Outcome: Progressing   Problem: Pain Management: Goal: Pain level will decrease Outcome: Progressing   Problem: Skin Integrity: Goal: Will show signs of wound healing Outcome: Progressing   Problem: Health Behavior/Discharge Planning: Goal: Identification of resources available to assist in meeting health care needs will improve Outcome: Progressing   Problem: Bladder/Genitourinary: Goal: Urinary functional status for postoperative course will improve Outcome: Progressing   Problem: Safety: Goal: Ability to remain free from injury will improve Outcome: Progressing

## 2020-04-28 NOTE — PMR Pre-admission (Signed)
PMR Admission Coordinator Pre-Admission Assessment  Patient: [name] is an 86 y.o., female MRN: [id number] DOB: 01-10-1930 Height: 5' 0.5" (153.7 cm) Weight: 66.7 kg  Insurance Information HMO:     PPO:      PCP:      IPA:      80/20:      OTHER:  PRIMARY: Medicare A and B      Policy#: [id number]      Subscriber: pt CM Name:       Phone#:      Fax#:  Pre-Cert#: verified Civil engineer, contracting:  Benefits:  Phone #:      Name:  Eff. Date: 02/05/97     Deduct: $1484      Out of Pocket Max:      Life Max:  CIR: 100%      SNF: 20 full days Outpatient: 80%     Co-Pay: 20% Home Health: 100%      Co-Pay:  DME: 80%     Co-Pay: 20% Providers: pt choice SECONDARY: BCBS      Policy#: [id number]     Phone#: [phone]  Financial Counselor:       Phone#:   The "Data Collection Information Summary" for patients in Inpatient Rehabilitation Facilities with attached "Privacy Act [name] Records" was provided and verbally reviewed with: Patient and Family  Emergency Contact Information Contact Information    Name Relation Home Work Mobile   [place] Daughter [phone]  [phone]   Ashley, [name] [phone]        Current Medical History  Patient Admitting Diagnosis: debility following L2-L5 lami/fusion  History of Present Illness: [name] is an 86 year old right-handed female with history of chronic anemia, breast cancer of upper outer quadrant of right breast 2017 status post lumpectomy with radiation therapy followed by oncology services, CKD stage III with creatinine baseline 1.43, type 2 diabetes mellitus, hyperlipidemia, right shoulder arthroplasty, 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  History of chronic back pain with decompression microdiscectomy 2017.  Presented 04/24/2020 with complaints of increasing back pain radiating to the lower extremities.  MRI and imaging lumbar showed multilevel lumbar spinal listhesis and  spinal stenosis.  Patient underwent bilateral redo of L2--3, 3-4 and L4-5 laminotomy/laminectomy/foraminotomies to decompress the bilateral L3-4 and L5 nerve roots as well as transforaminal lumbar interbody fusion 04/24/2020 per Dr. Arnoldo Morale.  Back brace when out of bed.  Hospital course anemia 6.9-7.5 as well as bouts of hypotension.  Bouts of hypoglycemia 04/28/2020, down to 35, and received diabetic gel.  Therapy evaluations completed and patient was recommended for a comprehensive rehab program.    Patient's medical record from [facility] has been reviewed by the rehabilitation admission coordinator and physician.  Past Medical History  Past Medical History:  Diagnosis Date  . Anemia   . Arthritis   . Bilateral edema of lower extremity   . 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 Chronic lower back pain   . CKD (chronic kidney disease) stage 3, GFR 30-59 ml/min (HCC)   . DDD (degenerative disc disease)    lumbar  . Diverticulosis   . Family history of breast cancer   . Family history of pancreatic cancer   . Family history of prostate  cancer   . Flaccid neuropathic bladder, not elsewhere classified   . Foley catheter in place   . GERD (gastroesophageal reflux disease)   . Hemorrhoids   . Hiatal hernia   . History of colon polyps   . History of radiation therapy 09-12-2015 to 10-12-2015   42.72 gray in 16 fractions directed right breast w/ boost of 12 gray in 6 fractions directed at the lumpectomy cavity- Total dose: 54.72y  . History of recurrent UTIs   . Hyperlipidemia   . PONV (postoperative nausea and vomiting)   . RBBB (right bundle branch block with left anterior fascicular block)   . Type 2 diabetes mellitus ([place])   . Urinary retention with incomplete  bladder emptying   . Wears dentures    full upper and lower partial  . Wears glasses   . Wears hearing aid    bilateral but does not wear    Family History   family history includes Coronary artery disease in her brother; Diabetes in her brother, daughter, father, mother, sister, and son; Heart attack in her brother; Heart disease in her father; Pancreatic cancer (age of onset: 60) in her mother; Prostate cancer in her brother.  Prior Rehab/Hospitalizations Has the patient had prior rehab or hospitalizations prior to admission? Yes  Has the patient had major surgery during 100 days prior to admission? Yes   Current Medications  Current Facility-Administered Medications:  .  0.9 %  sodium chloride infusion, 250 mL, Intravenous, Continuous, Newman Pies, MD .  acetaminophen (TYLENOL) tablet 650 mg, 650 mg, Oral, Q4H PRN, 650 mg at 04/28/20 2325 **OR** acetaminophen (TYLENOL) suppository 650 mg, 650 mg, Rectal, Q4H PRN, Newman Pies, MD .  atorvastatin (LIPITOR) tablet 10 mg, 10 mg, Oral, Daily, Newman Pies, MD, 10 mg at 05/02/20 0841 .  bisacodyl (DULCOLAX) suppository 10 mg, 10 mg, Rectal, Daily PRN, Newman Pies, MD .  Chlorhexidine Gluconate Cloth 2 % PADS 6 each, 6 each, Topical, Daily, Newman Pies, MD, 6 each at 05/02/20 [id number] .  cholecalciferol (VITAMIN D3) tablet 2,000 Units, 2,000 Units, Oral, Daily, Newman Pies, MD, 2,000 Units at 05/02/20 0840 .  cyclobenzaprine (FLEXERIL) tablet 10 mg, 10 mg, Oral, TID PRN, Newman Pies, MD, 10 mg at 04/26/20 2109 .  docusate sodium (COLACE) capsule 100 mg, 100 mg, Oral, BID, Newman Pies, MD, 100 mg at 05/02/20 0841 .  ferrous sulfate tablet 325 mg, 325 mg, Oral, BID WC, Newman Pies, MD, 325 mg at 05/02/20 0841 .  furosemide (LASIX) tablet 40 mg, 40 mg, Oral, Daily, Newman Pies, MD, 40 mg at 05/02/20 0841 .  gabapentin (NEURONTIN) capsule 300 mg, 300 mg, Oral, QHS, Newman Pies, MD, 300 mg at  05/01/20 2209 .  glipiZIDE (GLUCOTROL XL) 24 hr tablet 5 mg, 5 mg, Oral, QPM, Newman Pies, MD, 5 mg at 05/01/20 1636 .  glipiZIDE (GLUCOTROL) tablet 10 mg, 10 mg, Oral, q AM, Newman Pies, MD, 10 mg at 05/02/20 1017 .  HYDROcodone-acetaminophen (NORCO/VICODIN) 5-325 MG per tablet 1-2 tablet, 1-2 tablet, Oral, Q4H PRN, Newman Pies, MD, 2 tablet at 05/02/20 [phone] .  menthol-cetylpyridinium (CEPACOL) lozenge 3 mg, 1 lozenge, Oral, PRN, 3 mg at 04/28/20 1318 **OR** phenol (CHLORASEPTIC) mouth spray 1 spray, 1 spray, Mouth/Throat, PRN, Newman Pies, MD .  morphine 4 MG/ML injection 4 mg, 4 mg, Intravenous, Q2H PRN, Newman Pies, MD, 4 mg at 05/02/20 0528 .  ondansetron (ZOFRAN) tablet 4 mg, 4 mg, Oral, Q6H PRN **OR** ondansetron (ZOFRAN) injection 4 mg, 4  mg, Intravenous, Q6H PRN, Newman Pies, MD, 4 mg at 04/26/20 9233 .  oxyCODONE-acetaminophen (PERCOCET/ROXICET) 5-325 MG per tablet 1 tablet, 1 tablet, Oral, Q4H PRN, 1 tablet at 05/02/20 1017 **AND** oxyCODONE (Oxy IR/ROXICODONE) immediate release tablet 5 mg, 5 mg, Oral, Q4H PRN, Hammons, Kimberly B, RPH, 5 mg at 05/02/20 0207 .  oxyCODONE (OXYCONTIN) 12 hr tablet 15 mg, 15 mg, Oral, Q12H, Newman Pies, MD, 15 mg at 05/02/20 0841 .  pantoprazole (PROTONIX) EC tablet 80 mg, 80 mg, Oral, Q1200, Newman Pies, MD, 80 mg at 05/01/20 1138 .  polyethylene glycol (MIRALAX / GLYCOLAX) packet 17 g, 17 g, Oral, Daily PRN, Newman Pies, MD, 17 g at 04/30/20 2326 .  sodium chloride flush (NS) 0.9 % injection 3 mL, 3 mL, Intravenous, Q12H, Newman Pies, MD, 3 mL at 05/01/20 2209 .  sodium chloride flush (NS) 0.9 % injection 3 mL, 3 mL, Intravenous, PRN, Newman Pies, MD, 3 mL at 04/29/20 2200 .  traMADol (ULTRAM) tablet 50 mg, 50 mg, Oral, BID, Newman Pies, MD, 50 mg at 05/02/20 0841 .  zolpidem (AMBIEN) tablet 5 mg, 5 mg, Oral, QHS PRN, Newman Pies, MD  Patients Current Diet:  Diet Order            Diet Carb  Modified Fluid consistency: Thin; Room service appropriate? Yes  Diet effective now                 Precautions / Restrictions Precautions Precautions: Fall, Back Precaution Booklet Issued: Yes (comment) Precaution Comments: Requires cues throughout session for precautions Spinal Brace: Lumbar corset, Applied in sitting position Restrictions Weight Bearing Restrictions: No   Has the patient had 2 or more falls or a fall with injury in the past year? No  Prior Activity Level Limited Community (1-2x/wk): limited to doctors appointments following discharge from SNF over the summer; states limited mostly by decreased endurance and pain; using RW, not driving, bird bathes with occasional assist from family  Prior Functional Level Self Care: Did the patient need help bathing, dressing, using the toilet or eating? Needed some help  Indoor Mobility: Did the patient need assistance with walking from room to room (with or without device)? Independent  Stairs: Did the patient need assistance with internal or external stairs (with or without device)? Needed some help  Functional Cognition: Did the patient need help planning regular tasks such as shopping or remembering to take medications? Needed some help  Home Assistive Devices / [place] Devices/Equipment: Gilford Rile (specify type) Home Equipment: Walker - 2 wheels, Bedside commode, Shower seat  Prior Device Use: Indicate devices/aids used by the patient prior to current illness, exacerbation or injury? Walker  Current Functional Level Cognition  Overall Cognitive Status: Difficult to assess Difficult to assess due to: Hard of hearing/deaf Orientation Level: Oriented X4 General Comments: e    Extremity Assessment (includes Sensation/Coordination)  Upper Extremity Assessment: Generalized weakness  Lower Extremity Assessment: Defer to PT evaluation LLE Deficits / Details: radicular pain L lower leg LLE: Unable to  fully assess due to pain    ADLs  Overall ADL's : Needs assistance/impaired Eating/Feeding: Set up, Bed level Eating/Feeding Details (indicate cue type and reason): supervision for safety as pt continues to be lethargic although dtr states pt always talks with her eyes closed Grooming: Supervision/safety, Set up, Sitting, Standing Grooming Details (indicate cue type and reason): education on not bending for ADLs, pt attempted to stand at sink for oral care but noted to lean on sink.  deferred oral care to sitting in recliner. Upper Body Bathing: Moderate assistance Lower Body Bathing: Total assistance Lower Body Bathing Details (indicate cue type and reason): education provided on not bending, however pt states she does sponge baths Upper Body Dressing : Sitting, Total assistance Upper Body Dressing Details (indicate cue type and reason): unable to don brace , total (A)  Lower Body Dressing: Maximal assistance, Sit to/from stand, Cueing for safety, Cueing for sequencing, With adaptive equipment Lower Body Dressing Details (indicate cue type and reason): pt able to don pants with reacher with MAX A. pt required cues for sequencing of task and overall technique. pt reports she prefers to wear gowns at home Toilet Transfer: Minimal assistance, RW, Ambulation Toilet Transfer Details (indicate cue type and reason): cues for RW mgmt and cues to remind pt to keep eyes open during ambulation. pt reports she rarely sits on the toilet d/t her catheter and wearing diaper. Toileting - Clothing Manipulation Details (indicate cue type and reason): education and visual demo of maintaining back precautions during posterior pericare. pt able to demo lateral leans from recliner but would likely need MAX A for cleanliness. issued dtr and pt education on availbale DME for pericare with pt and dtr verbalizing understanding Tub/Shower Transfer Details (indicate cue type and reason): pt reports completing sponge baths at  home Functional mobility during ADLs: Minimal assistance, Cueing for safety, Rolling walker General ADL Comments: pt continues to be limited by lethargy, pain, back precautions and decreased ability to care for self impacting pts ability to complete BADLs    Mobility  Overal bed mobility: Needs Assistance Bed Mobility: Rolling, Sidelying to Sit Rolling: Mod assist Sidelying to sit: Mod assist Supine to sit: Mod assist Sit to sidelying: Min assist General bed mobility comments: cues for best technique, assisted into hook lying before assist of rolling and side to sit.    Transfers  Overall transfer level: Needs assistance Equipment used: Rolling walker (2 wheeled) Transfers: Sit to/from Stand Sit to Stand: Min assist Stand pivot transfers: Min assist General transfer comment: cues for hand placement.  assist forward and some boost.    Ambulation / Gait / Stairs / Wheelchair Mobility  Ambulation/Gait Ambulation/Gait assistance: Min guard, Min assist Gait Distance (Feet): 20 Feet (x2) Assistive device: Rolling walker (2 wheeled) Gait Pattern/deviations: Step-to pattern, Step-through pattern, Decreased stance time - left, Decreased step length - right, Decreased step length - left, Decreased stride length General Gait Details: assist at times for stability, advancing RW or help turning RW.  Postural  and RW proximity cues. Gait velocity: Decreased Gait velocity interpretation: <1.31 ft/sec, indicative of household ambulator    Posture / Balance Dynamic Sitting Balance Sitting balance - Comments: prefers 1 UE assist, but can sit unsupported to work on donning the brace. Balance Overall balance assessment: Needs assistance Sitting-balance support: No upper extremity supported, Single extremity supported Sitting balance-Leahy Scale: Fair Sitting balance - Comments: prefers 1 UE assist, but can sit unsupported to work on donning the brace. Postural control: Posterior lean Standing  balance support: Bilateral upper extremity supported, Single extremity supported Standing balance-Leahy Scale: Poor Standing balance comment: Reliant on RW for support, needs assist to manage AD    Special needs/care consideration Diabetic management yes and Special service needs chronic foley   Previous Home Environment (from acute therapy documentation) Living Arrangements: Spouse/significant other Available Help at Discharge: Family, Available 24 hours/day Type of Home: House Home Layout: One level Home Access: Ramped entrance Bathroom Shower/Tub: Holiday representative  Toilet: Handicapped height Home Care Services: No Additional Comments: reports daughter can help a few days  Discharge Living Setting Plans for Discharge Living Setting: Lives with (comment) (spouse and daughter) Type of Home at Discharge: House Discharge Home Layout: One level Discharge Home Access: [place] entrance Discharge Bathroom Shower/Tub: Walk-in shower Discharge Bathroom Toilet: Standard Discharge [place] Accessibility: Yes How Accessible: Accessible via walker Does the patient have any problems obtaining your medications?: No  Social/Family/Support Systems Patient Roles: Spouse Contact Information: [place] Anticipated Caregiver: Festus Barren (daughter( Anticipated Caregiver's Contact Information: [phone] (angela); [phone] (spouse) Ability/Limitations of Caregiver: min assist Caregiver Availability: 24/7 Discharge Plan Discussed with Primary Caregiver: Yes Is Caregiver In Agreement with Plan?: Yes Does Caregiver/Family have Issues with Lodging/Transportation while Pt is in Rehab?: No  Goals Patient/Family Goal for Rehab: PT/OT supervision to mod I, SLP n/a Expected length of stay: 8-10 days Additional Information: per daughter, pt does keep her eyes closed when talking sometimes Pt/Family Agrees to Admission and willing to participate: Yes Program Orientation Provided &  Reviewed with Pt/Caregiver Including Roles  & Responsibilities: Yes  Decrease burden of Care through IP rehab admission: n/a  Possible need for SNF placement upon discharge: Not anticipated  Patient Condition: I have reviewed medical records from [facility], spoken with CM, and patient and daughter. I met with patient at the bedside for inpatient rehabilitation assessment.  Patient will benefit from ongoing PT and OT, can actively participate in 3 hours of therapy a day 5 days of the week, and can make measurable gains during the admission.  Patient will also benefit from the coordinated team approach during an Inpatient Acute Rehabilitation admission.  The patient will receive intensive therapy as well as Rehabilitation physician, nursing, social worker, and care management interventions.  Due to bladder management, safety, skin/wound care, disease management, medication administration, pain management and patient education the patient requires 24 hour a day rehabilitation nursing.  The patient is currently min to mod assist with mobility and basic ADLs.  Discharge setting and therapy post discharge at home is anticipated.  Patient has agreed to participate in the Acute Inpatient Rehabilitation Program and will admit today.  Preadmission Screen Completed [id number] E Cletus Gash, 05/02/2020 10:49 AM ______________________________________________________________________   Discussed status with Dr. Naaman Plummer on 05/02/20  at 10:49 AM  and received approval for admission today.  Admission Coordinator: Michel Santee, PT, time 10:49 AM Sudie Grumbling 05/02/20    Assessment/Plan: Diagnosis: debility and functional deficits after L2-5 laminectomy and fusion 1. Does the need for close, 24 hr/day Medical supervision in concert with the patient's rehab needs make it unreasonable for this patient to be served in a less intensive setting? Yes 2. Co-Morbidities requiring supervision/potential complications: CKD III,  breast CA, pain mgt, hypotension 3. Due to bladder management, bowel management, safety, skin/wound care, disease management, medication administration, pain management and patient education, does the patient require 24 hr/day rehab nursing? Yes 4. Does the patient require coordinated care of a physician, rehab nurse, PT, OT, and SLP to address physical and functional deficits in the context of the above medical diagnosis(es)? Yes Addressing deficits in the following areas: balance, endurance, locomotion, strength, transferring, bowel/bladder control, bathing, dressing, feeding, grooming, toileting and psychosocial support 5. Can the patient actively participate in an intensive therapy program of at least 3 hrs of therapy 5 days a week? Yes 6. The potential for patient to make measurable gains while on inpatient rehab is excellent 7. Anticipated functional outcomes upon discharge from inpatient rehab:  modified independent and supervision PT, modified independent and supervision OT, n/a SLP 8. Estimated rehab length of stay to reach the above functional goals is: 8-10 days 9. Anticipated discharge destination: Home 10. Overall Rehab/Functional Prognosis: excellent   MD Signature: Meredith Staggers, MD, [facility] 05/02/2020

## 2020-04-28 NOTE — Consult Note (Signed)
   [facility] CM Inpatient Consult   04/28/2020  PAMMY VESEY 1929-11-12 [id number]  Patient is currently 'pending status' active with [facility] for chronic disease management services.  Patient has been engaged by a [place] community based plan of care has focused on disease management and community resource support.    Plan: Follow for disposition and post hospital follow up needs.  Of note, [facility] Care Management services does not replace or interfere with any services that are needed or arranged by inpatient [facility] care management team.  For additional questions or referrals please contact:  Natividad Brood, RN BSN [facility] Liaison  [phone] business mobile phone Toll free office [phone]  Fax number: [phone] [email or web address]

## 2020-04-28 NOTE — Progress Notes (Signed)
Subjective: The patient is somnolent but arousable.  She says "I do not feel well today".  Objective: Vital signs in last 24 hours: Temp:  [98.6 F (37 C)-99.9 F (37.7 C)] 98.8 F (37.1 C) (10/22 0300) Pulse Rate:  [80-106] 99 (10/22 0300) Resp:  [15-18] 16 (10/22 0300) BP: (87-111)/(39-53) 102/53 (10/22 0300) SpO2:  [91 %-96 %] 93 % (10/22 0300) Estimated body mass index is 28.24 kg/m as calculated from the following:   Height as of this encounter: 5' 0.5" (1.537 m).   Weight as of this encounter: 66.7 kg.   Intake/Output from previous day: 10/21 0701 - 10/22 0700 In: -  Out: 1100 [Urine:1100] Intake/Output this shift: Total I/O In: -  Out: 600 [Urine:600]  Physical exam the patient is somnolent but arousable.  She is moving all 4 extremities well.  Lab Results: Recent Labs    04/26/20 0736  WBC 8.3  HGB 7.5*  HCT 23.7*  PLT 163   BMET No results for input(s): NA, K, CL, CO2, GLUCOSE, BUN, CREATININE, CALCIUM in the last 72 hours.  Studies/Results: No results found.  Assessment/Plan: Postop day #4: We will continue to mobilize the patient with PT and OT.  We are awaiting rehab placement.  We will discontinue her Foley catheter and lumbar dressings.  LOS: 4 days     Ophelia Charter 04/28/2020, 6:40 AM

## 2020-04-28 NOTE — Progress Notes (Signed)
Physical Therapy Treatment Patient Details Name: [name] MRN: [id number] DOB: October 23, 1929 Today's Date: 04/28/2020    History of Present Illness 86 yo female s/p bilateral redo L2-5 laminectomy/foraminotomy and medial facetectomy, L2-5 transforaminal lumbar interbody fusion on 10/18. PMH includes anemia, OA, breast cancer s/p radiation and R lumpectomy, CKD, DDD, UTIs, HLD, RBBB, DMII, R shoulder arthroplasty, bilat TKR.    PT Comments    Pt with good participation and tolerance for functional mobility, anxious prior to initiating session however with encouragement, pt agreeable to supine BLE therex (with active assist and good tolerance) and transfer training. Pt able to perform log roll to sit EOB with modA and performed STS/stand pivot transfers with minA and heavy reliance on RW. Pt diaphoretic and tachycardic (BP stable), RN notified, deferred further gait trial. Pt agreeable to sitting up in chair ~1hr at end of session, daughter present in room and encouraging. Will attempt to progress gait/standing tolerance next session. Pt continues to benefit from PT services to progress toward functional mobility goals. D/C recs below remain appropriate.   Follow Up Recommendations  CIR;Supervision/Assistance - 24 hour     Equipment Recommendations  None recommended by PT    Recommendations for Other Services       Precautions / Restrictions Precautions Precautions: Fall;Back Precaution Booklet Issued: Yes (comment) Precaution Comments: Requires cues throughout session for precautions Required Braces or Orthoses: Spinal Brace Spinal Brace: Lumbar corset;Applied in sitting position Restrictions Weight Bearing Restrictions: No    Mobility  Bed Mobility Overal bed mobility: Needs Assistance Bed Mobility: Rolling;Sidelying to Sit Rolling: Min assist Sidelying to sit: Mod assist       General bed mobility comments: cues for log rolling, needs modA for trunk rise, fair  understanding of sequencing  Transfers Overall transfer level: Needs assistance Equipment used: Rolling walker (2 wheeled) Transfers: Sit to/from [facility] Sit to Stand: Min assist Stand pivot transfers: Min assist       General transfer comment: VC's for hand placement on seated surface for safety. Pt with difficulty scooting out to prepare for stand, and with increased effort to transition hands from arm rests of chair to the walker.   Ambulation/Gait Ambulation/Gait assistance:  (defer due to tachycardic and diaphoretic, RN notified)               Stairs             Wheelchair Mobility    Modified Rankin (Stroke Patients Only)       Balance Overall balance assessment: Needs assistance Sitting-balance support: Bilateral upper extremity supported;Feet supported Sitting balance-Leahy Scale: Poor Sitting balance - Comments: varying Supervision to minA due to posterior lean Postural control: Posterior lean Standing balance support: Bilateral upper extremity supported Standing balance-Leahy Scale: Poor Standing balance comment: Reliant on RW for support, needs assist to manage AD                            Cognition Arousal/Alertness: Lethargic Behavior During Therapy: Flat affect Overall Cognitive Status: Difficult to assess                                 General Comments: Limited verbalizations throughout session with eyes closed often.      Exercises General Exercises - Lower Extremity Ankle Circles/Pumps: AROM;Strengthening;10 reps;Seated Quad Sets: AROM;Both;10 reps;Supine Long Arc Quad: AROM;Strengthening;Both;10 reps;Seated Hip ABduction/ADduction: AAROM;Strengthening;Both;10 reps;Supine  General Comments General comments (skin integrity, edema, etc.): pt diaphoretic and tachycardic to 112 bpm      Pertinent Vitals/Pain Pain Assessment: Faces Faces Pain Scale: Hurts even more Pain Location:  LBP Pain Descriptors / Indicators: Sore;Discomfort;Operative site guarding Pain Intervention(s): Limited activity within patient's tolerance;Monitored during session;Premedicated before session;Repositioned  SpO2 87-93% on RA, HR 106-112 bpm during mobility BP 130/55 (75) supine; BP 125/55 (74) seated after 3 mins  Home Living                      Prior Function            PT Goals (current goals can now be found in the care plan section) Acute Rehab PT Goals Patient Stated Goal: Get to where I'm able to do more for myself PT Goal Formulation: With patient Time For Goal Achievement: 05/09/20 Potential to Achieve Goals: Good Progress towards PT goals: Progressing toward goals    Frequency    Min 5X/week      PT Plan Current plan remains appropriate    Co-evaluation              AM-PAC PT "6 Clicks" Mobility   Outcome Measure  Help needed turning from your back to your side while in a flat bed without using bedrails?: A Little Help needed moving from lying on your back to sitting on the side of a flat bed without using bedrails?: A Lot Help needed moving to and from a bed to a chair (including a wheelchair)?: A Little Help needed standing up from a chair using your arms (e.g., wheelchair or bedside chair)?: A Little Help needed to walk in hospital room?: A Little Help needed climbing 3-5 steps with a railing? : A Lot 6 Click Score: 16    End of Session Equipment Utilized During Treatment: Back brace;Gait belt Activity Tolerance: Patient limited by fatigue;Patient limited by pain Patient left: in chair;with call bell/phone within reach;with nursing/sitter in room Nurse Communication: Mobility status PT Visit Diagnosis: Other abnormalities of gait and mobility (R26.89);Difficulty in walking, not elsewhere classified (R26.2)     Time: 7510-2585 PT Time Calculation (min) (ACUTE ONLY): 43 min  Charges:  $Therapeutic Exercise: 23-37 mins $Therapeutic  Activity: 8-22 mins                     Alissandra Geoffroy P., PTA Acute Rehabilitation Services Pager: [phone] Office: [place] 04/28/2020, 1:10 PM

## 2020-04-28 NOTE — Progress Notes (Signed)
Inpatient Rehab Admissions Coordinator:   Following for possible CIR admit.  Note pt with tmax 100.7.  Discussed with Meghan, NP, who states CBC and BMP already ordered for workup.  Will continue to follow.  I will not have a bed available for this patient today.   Shann Medal, PT, DPT Admissions Coordinator [phone] 04/28/20  11:26 AM

## 2020-04-29 LAB — GLUCOSE, CAPILLARY: Glucose-Capillary: 69 mg/dL — ABNORMAL LOW (ref 70–99)

## 2020-04-29 NOTE — Progress Notes (Signed)
NEUROSURGERY PROGRESS NOTE  Doing okay. Complains of moderate to severe back pain. No numbness, tingling or weakness Ambulating and voiding well Good strength and sensation Incision CDI  Temp:  [98.2 F (36.8 C)-98.9 F (37.2 C)] 98.4 F (36.9 C) (10/23 0827) Pulse Rate:  [90-110] 110 (10/23 0827) Resp:  [15-17] 17 (10/23 0827) BP: (110-154)/(47-58) 154/52 (10/23 0827) SpO2:  [94 %-97 %] 94 % (10/23 0827)  Plan: Status post lumbar fusion 10/18. Work on pain control and mobilize. Plan is for CIR  Eleonore Chiquito, NP 04/29/2020 9:46 AM

## 2020-04-29 NOTE — Progress Notes (Signed)
CRITICAL VALUE ALERT  Critical Value:  CBG 69  Date & Time Notied:  04/29/2020 0654  Provider Notified: No  Orders Received/Actions taken: Gave orange juice 4 oz  Day shift to follow up.

## 2020-04-29 NOTE — Plan of Care (Signed)
  Problem: Pain Management: Goal: Pain level will decrease 04/29/2020 0801 by Mayme Genta, RN Outcome: Progressing 04/29/2020 0757 by Mayme Genta, RN Outcome: Progressing   Problem: Safety: Goal: Ability to remain free from injury will improve 04/29/2020 0801 by Mayme Genta, RN Outcome: Progressing 04/29/2020 0757 by Mayme Genta, RN Outcome: Progressing

## 2020-04-30 LAB — GLUCOSE, CAPILLARY: Glucose-Capillary: 115 mg/dL — ABNORMAL HIGH (ref 70–99)

## 2020-04-30 NOTE — Progress Notes (Signed)
Patient ID: [name], female   DOB: 1929/09/07, 86 y.o.   MRN: [id number] Continues to complain of significant back pain which radiates into the left hip and leg.  She seems to have fairly good strength in the lower extremities to and in bed exam.  Continue physical and occupational therapy and await CIR versus SNF

## 2020-04-30 NOTE — Progress Notes (Signed)
Physical Therapy Treatment Patient Details Name: [name] MRN: [id number] DOB: 1929-11-26 Today's Date: 04/30/2020    History of Present Illness 86 yo female s/p bilateral redo L2-5 laminectomy/foraminotomy and medial facetectomy, L2-5 transforaminal lumbar interbody fusion on 10/18. PMH includes anemia, OA, breast cancer s/p radiation and R lumpectomy, CKD, DDD, UTIs, HLD, RBBB, DMII, R shoulder arthroplasty, bilat TKR.    PT Comments    Pt progressing slowly towards her physical therapy goals, remains very limited by pain, but agreeable to participate in session. RN notified and administered pain medication at beginning of session. Focused on therapeutic exercises for strengthening and functional mobility. Pt performing x 3 sit to stands from edge of bed with min assist. Pivoted from bed to chair using a walker. Continue to recommend comprehensive inpatient rehab (CIR) for post-acute therapy needs.    Follow Up Recommendations  CIR;Supervision/Assistance - 24 hour     Equipment Recommendations  None recommended by PT    Recommendations for Other Services       Precautions / Restrictions Precautions Precautions: Fall;Back Required Braces or Orthoses: Spinal Brace Spinal Brace: Lumbar corset;Applied in sitting position Restrictions Weight Bearing Restrictions: No    Mobility  Bed Mobility Overal bed mobility: Needs Assistance Bed Mobility: Rolling;Sidelying to Sit Rolling: Mod assist Sidelying to sit: Mod assist       General bed mobility comments: ModA to roll onto left side, progress BLE's off edge of bed and bring trunk to upright position  Transfers Overall transfer level: Needs assistance Equipment used: Rolling walker (2 wheeled) Transfers: Sit to/from [facility] Sit to Stand: Min assist Stand pivot transfers: Min assist       General transfer comment: MinA to rise from edge of bed x 3, noted knee instability, pivoting towards left to  recliner. Cues for sequencing/direction  Ambulation/Gait                 Stairs             Wheelchair Mobility    Modified Rankin (Stroke Patients Only)       Balance Overall balance assessment: Needs assistance Sitting-balance support: Bilateral upper extremity supported;Feet supported;Single extremity supported Sitting balance-Leahy Scale: Poor Sitting balance - Comments: reliant on at least single UE support   Standing balance support: Bilateral upper extremity supported Standing balance-Leahy Scale: Poor                              Cognition Arousal/Alertness: Awake/alert Behavior During Therapy: Flat affect Overall Cognitive Status: Difficult to assess                                        Exercises General Exercises - Lower Extremity Ankle Circles/Pumps: Both;20 reps;Supine Quad Sets: Both;10 reps;Supine Heel Slides: Both;10 reps;Supine Hip ABduction/ADduction: Both;10 reps;Supine    General Comments        Pertinent Vitals/Pain Pain Assessment: 0-10 Pain Score: 10-Worst pain ever Pain Location: LBP Pain Descriptors / Indicators: Sore;Discomfort;Operative site guarding;Grimacing;Constant Pain Intervention(s): Limited activity within patient's tolerance;Monitored during session;Patient requesting pain meds-RN notified;Premedicated before session;Repositioned    Home Living                      Prior Function            PT Goals (current goals can now be  found in the care plan section) Acute Rehab PT Goals Patient Stated Goal: less pain Potential to Achieve Goals: Good Progress towards PT goals: Progressing toward goals    Frequency    Min 5X/week      PT Plan Current plan remains appropriate    Co-evaluation              AM-PAC PT "6 Clicks" Mobility   Outcome Measure  Help needed turning from your back to your side while in a flat bed without using bedrails?: A Lot Help needed  moving from lying on your back to sitting on the side of a flat bed without using bedrails?: A Lot Help needed moving to and from a bed to a chair (including a wheelchair)?: A Little Help needed standing up from a chair using your arms (e.g., wheelchair or bedside chair)?: A Little Help needed to walk in hospital room?: A Little Help needed climbing 3-5 steps with a railing? : A Lot 6 Click Score: 15    End of Session Equipment Utilized During Treatment: Gait belt;Back brace Activity Tolerance: Patient limited by fatigue;Patient limited by pain Patient left: in chair;with call bell/phone within reach;with nursing/sitter in room Nurse Communication: Mobility status PT Visit Diagnosis: Other abnormalities of gait and mobility (R26.89);Difficulty in walking, not elsewhere classified (R26.2)     Time: 3149-7026 PT Time Calculation (min) (ACUTE ONLY): 38 min  Charges:  $Therapeutic Exercise: 8-22 mins $Therapeutic Activity: 23-37 mins                     [name], PT, DPT Acute Rehabilitation Services Pager [phone] Office [phone]    [name] 04/30/2020, 3:45 PM

## 2020-04-30 NOTE — Plan of Care (Addendum)
2325: Patient called out for pain medication, went and assessed patient pain scale. Patient stated she has been calling out and no one has came to give her pain medication. Scheduled pain medication had been given prior to. Asked front desk if patient had been calling out, they said no. Check call bell to see if it was working, it was. I have been sitting outside patient's room close by and have not seen the light go off. I have passed by her room multiple times when rounding and patient's eyes have been closed majority of the time. I have educated patient on how to call and when meds are due. Rotating patient every 2 hours and using heat to address pain as well. Will continue to monitor patient.    Problem: Education: Goal: Ability to verbalize activity precautions or restrictions will improve Outcome: Progressing   Problem: Activity: Goal: Ability to avoid complications of mobility impairment will improve Outcome: Progressing   Problem: Pain Management: Goal: Pain level will decrease Outcome: Progressing   Problem: Skin Integrity: Goal: Will show signs of wound healing Outcome: Progressing   Problem: Safety: Goal: Ability to remain free from injury will improve Outcome: Progressing

## 2020-04-30 NOTE — TOC Initial Note (Signed)
Transition of Care [facility]) - Initial/Assessment Note    Patient Details  Name: [name] MRN: [id number] Date of Birth: 18-May-1930  [facility]) CM/SW Contact:    Erenest Rasher, RN Phone Number: [phone] 04/30/2020, 9:35 AM  Clinical Narrative:                  TOC CM spoke to pt's dtr, [name]. Plan is for CIR possible tomorrow or when bed available. Per IP rehab rep, waiting bed availability.     Expected Discharge Plan: IP Rehab Facility Barriers to Discharge: Continued Medical Work up   Patient Goals and CMS Choice Patient states their goals for this hospitalization and ongoing recovery are:: daughter agrees with IP rehab CMS Medicare.gov Compare Post Acute Care list provided to:: Patient Represenative (must comment) [name]-dtr) Choice offered to / list presented to : Adult Children  Expected Discharge Plan and Services Expected Discharge Plan: IP Rehab Facility In-house Referral: Clinical Social Work Discharge Planning Services: CM Consult Post Acute Care Choice: IP Rehab Living arrangements for the past 2 months: Single Family Home                           HH Arranged: PT, OT, Nurse's Aide [place] Agency: [place] ([facility]) Date [place]: 04/25/20 Time [place]: 1209 Representative spoke with at [place]: [place] Arrangements/Services Living arrangements for the past 2 months: [place] with:: Spouse Patient language and need for interpreter reviewed:: Yes Do you feel safe going back to the place where you live?: Yes      Need for Family Participation in Patient Care: Yes (Comment) Care giver support system in place?: Yes (comment) Current home services: DME Criminal Activity/Legal Involvement Pertinent to Current Situation/Hospitalization: No - Comment as needed  Activities of Daily Living Home Assistive Devices/Equipment: [place] Rile (specify type) ADL Screening  (condition at time of admission) Patient's cognitive ability adequate to safely complete daily activities?: Yes Is the patient deaf or have difficulty hearing?: Yes Does the patient have difficulty seeing, even when wearing glasses/contacts?: No Does the patient have difficulty concentrating, remembering, or making decisions?: No Patient able to express need for assistance with ADLs?: Yes Does the patient have difficulty dressing or bathing?: No Independently performs ADLs?: Yes (appropriate for developmental age) Does the patient have difficulty walking or climbing stairs?: Yes Weakness of Legs: Both Weakness of Arms/Hands: None  Permission Sought/Granted Permission sought to share information with : Facility Sport and exercise psychologist, Family Supports Permission granted to share information with : Yes, Verbal Permission Granted  Share Information with NAME: [name]  Permission granted to share info w AGENCY: [place]  Permission granted to share info w Relationship: Daughter     Emotional Assessment Appearance:: Appears stated age   Affect (typically observed): Accepting, Appropriate Orientation: : Oriented to Self, Oriented to Place, Oriented to  Time, Oriented to Situation Alcohol / Substance Use: Not Applicable Psych Involvement: No (comment)  Admission diagnosis:  Spondylolisthesis of lumbar region [M43.16] Patient Active Problem List   Diagnosis Date Noted  . Spondylolisthesis of lumbar region 04/24/2020  . Urinary retention with incomplete bladder emptying   . Hip pain   . Severe back pain   . AKI (acute kidney injury) ([place])   . Intractable pain/ left hip osteoarthritis 12/25/2019  . Spinal stenosis, lumbar region, with neurogenic claudication 03/06/2016  . Malignant neoplasm of right female breast ([place])  01/11/2016  . Controlled type 2 diabetes mellitus without complication, without long-term current use of insulin ([place]) 01/11/2016  . Genetic testing 08/18/2015  . Family history  of breast cancer   . Family history of prostate cancer   . Family history of pancreatic cancer   . Breast cancer of upper-outer quadrant of right female breast ([place]) 07/21/2015  . Insomnia 05/06/2015  . Medication management 12/16/2013  . Hand arthritis 12/16/2013  . Rectal bleeding 12/16/2013  . Cold intolerance 06/17/2013  . Renal insufficiency 12/23/2012  . Post-menopause 12/23/2012  . Hyperlipidemia 12/23/2012  . Postmenopausal HRT (hormone replacement therapy) 12/23/2012  . History of shingles 12/23/2012  . Wears hearing aid 12/23/2012  . Nonspecific abnormal unspecified cardiovascular function study 02/28/2012  . Chronic sore throat 01/13/2012  . Hoarse 01/13/2012  . Chest pressure 12/03/2011  . Itching  mid back 12/03/2011  . Abnormal EKG 12/03/2011  . Medicare annual wellness visit, subsequent 12/03/2011  . DJD (degenerative joint disease) 12/03/2011  . DIABETES MELLITUS, TYPE II, CONTROLLED 11/15/2009  . VITAMIN D DEFICIENCY 11/15/2009  . EXOGENOUS OBESITY 11/15/2009  . ANXIETY DEPRESSION 12/22/2008  . HYPERTHYROIDISM 08/23/2008  . TOTAL KNEE REPLACEMENT, LEFT, HX OF 08/23/2008  . Hypothyroidism 10/08/2007  . ANEMIA 10/08/2007  . Osteoarthritis 10/08/2007  . OSTEOPENIA 10/08/2007  . Dyslipidemia 11/07/2006  . GERD (gastroesophageal reflux disease) 11/07/2006   PCP:  Burnis Medin, MD Pharmacy:   [facility] [place], [place] [address] [place] [address] Phone: [phone] Fax: [phone]  [facility] [place], [place] [address] [place] [address] Phone: [phone] Fax: [phone]     Social Determinants of Health (SDOH) Interventions    Readmission Risk Interventions No flowsheet data found.

## 2020-04-30 NOTE — Progress Notes (Signed)
Pt educated on using PO Vicodin vs IV Morphine. Pt called out multiple times through out the night for pain meds, when she was assessed on pain scale, patient fast asleep. Pt tolerated PO pain meds well.

## 2020-05-01 MED ORDER — OXYCODONE HCL ER 15 MG PO T12A
15.0000 mg | EXTENDED_RELEASE_TABLET | Freq: Two times a day (BID) | ORAL | Status: DC
  Administered 2020-05-01 – 2020-05-02 (×3): 15 mg via ORAL
  Filled 2020-05-01 (×3): qty 1

## 2020-05-01 NOTE — Progress Notes (Signed)
Physical Therapy Treatment Patient Details Name: [name] MRN: [id number] DOB: 08-14-1929 Today's Date: 05/01/2020    History of Present Illness 86 yo female s/p bilateral redo L2-5 laminectomy/foraminotomy and medial facetectomy, L2-5 transforaminal lumbar interbody fusion on 10/18. PMH includes anemia, OA, breast cancer s/p radiation and R lumpectomy, CKD, DDD, UTIs, HLD, RBBB, DMII, R shoulder arthroplasty, bilat TKR.    PT Comments    Pt still showing/relaying a lot of pain with movement.  Emphasis on warm up gentle exercise, transition to EOB.  Sitting balance while working to down the brace herself, multiple sit to stands, pregait activity, 2 bouts of gait and ending sitting in the chair.    Follow Up Recommendations  CIR;Supervision/Assistance - 24 hour     Equipment Recommendations  None recommended by PT    Recommendations for Other Services       Precautions / Restrictions Precautions Precautions: Fall;Back Precaution Booklet Issued: Yes (comment) Precaution Comments: Requires cues throughout session for precautions Required Braces or Orthoses: Spinal Brace Spinal Brace: Lumbar corset;Applied in sitting position Restrictions Weight Bearing Restrictions: No    Mobility  Bed Mobility Overal bed mobility: Needs Assistance Bed Mobility: Rolling;Sidelying to Sit Rolling: Mod assist Sidelying to sit: Mod assist       General bed mobility comments: cues for best technique, assisted into hook lying before assist of rolling and side to sit.  Transfers Overall transfer level: Needs assistance Equipment used: Rolling walker (2 wheeled) Transfers: Sit to/from Stand Sit to Stand: Min assist         General transfer comment: cues for hand placement.  assist forward and some boost.  Ambulation/Gait Ambulation/Gait assistance: Min guard;Min assist Gait Distance (Feet): 20 Feet (x2) Assistive device: Rolling walker (2 wheeled) Gait Pattern/deviations:  Step-to pattern;Step-through pattern;Decreased stance time - left;Decreased step length - right;Decreased step length - left;Decreased stride length Gait velocity: Decreased Gait velocity interpretation: <1.31 ft/sec, indicative of household ambulator General Gait Details: assist at times for stability, advancing RW or help turning RW.  Postural  and RW proximity cues.   Stairs             Wheelchair Mobility    Modified Rankin (Stroke Patients Only)       Balance   Sitting-balance support: No upper extremity supported;Single extremity supported Sitting balance-Leahy Scale: Fair Sitting balance - Comments: prefers 1 UE assist, but can sit unsupported to work on donning the brace.   Standing balance support: Bilateral upper extremity supported;Single extremity supported Standing balance-Leahy Scale: Poor Standing balance comment: Reliant on RW for support, needs assist to manage AD                            Cognition Arousal/Alertness: Awake/alert Behavior During Therapy: Flat affect Overall Cognitive Status: Difficult to assess                                 General Comments: e      Exercises General Exercises - Lower Extremity Heel Slides: Both;10 reps;Supine;Other (comment) (with graded resistance in gross extension)    General Comments General comments (skin integrity, edema, etc.): Reinforced back precautions, worked on donning the brace      Pertinent Vitals/Pain Pain Assessment: 0-10 Faces Pain Scale: Hurts worst Pain Location: LBP Pain Descriptors / Indicators: Sore;Discomfort;Operative site guarding;Grimacing;Constant Pain Intervention(s): Limited activity within patient's tolerance;Monitored during session;Premedicated before session  Home Living                      Prior Function            PT Goals (current goals can now be found in the care plan section) Acute Rehab PT Goals Patient Stated Goal: less  pain PT Goal Formulation: With patient Time For Goal Achievement: 05/09/20 Potential to Achieve Goals: Good Progress towards PT goals: Progressing toward goals (slow progression)    Frequency    Min 5X/week      PT Plan Current plan remains appropriate    Co-evaluation              AM-PAC PT "6 Clicks" Mobility   Outcome Measure  Help needed turning from your back to your side while in a flat bed without using bedrails?: A Lot Help needed moving from lying on your back to sitting on the side of a flat bed without using bedrails?: A Lot Help needed moving to and from a bed to a chair (including a wheelchair)?: A Little Help needed standing up from a chair using your arms (e.g., wheelchair or bedside chair)?: A Little Help needed to walk in hospital room?: A Little Help needed climbing 3-5 steps with a railing? : A Lot 6 Click Score: 15    End of Session Equipment Utilized During Treatment: Back brace Activity Tolerance: Patient limited by pain Patient left: in chair;with call bell/phone within reach;with nursing/sitter in room Nurse Communication: Mobility status PT Visit Diagnosis: Other abnormalities of gait and mobility (R26.89);Difficulty in walking, not elsewhere classified (R26.2)     Time: 3016-0109 PT Time Calculation (min) (ACUTE ONLY): 38 min  Charges:  $Gait Training: 8-22 mins $Therapeutic Exercise: 8-22 mins $Therapeutic Activity: 8-22 mins                     05/01/2020  [name]., PT Acute Rehabilitation Services [phone]  (pager) [phone]  (office)   [name] [name] 05/01/2020, 11:39 AM

## 2020-05-01 NOTE — Progress Notes (Signed)
Inpatient Rehab Admissions Coordinator:   I have no beds available for this patient to admit to CIR today.  Will continue to follow for timing of potential admission pending bed availability.   Shann Medal, PT, DPT Admissions Coordinator [phone] 05/01/20  9:59 AM

## 2020-05-01 NOTE — Progress Notes (Addendum)
Subjective: The patient is alert and pleasant.  She looks better than last week.  Her daughter is at the bedside.  She complains of pain "all over" mainly complaining of back pain.  She has ambulated short distances with PT and OT.  She says the oral pain medications "do not last long enough".  Objective: Vital signs in last 24 hours: Temp:  [98.1 F (36.7 C)-98.3 F (36.8 C)] 98.2 F (36.8 C) (10/25 0710) Pulse Rate:  [85-92] 88 (10/25 0710) Resp:  [16] 16 (10/25 0710) BP: (121-147)/(60-66) 125/62 (10/25 0710) SpO2:  [90 %-98 %] 98 % (10/25 0710) Estimated body mass index is 28.24 kg/m as calculated from the following:   Height as of this encounter: 5' 0.5" (1.537 m).   Weight as of this encounter: 66.7 kg.   Intake/Output from previous day: 10/24 0701 - 10/25 0700 In: -  Out: 2950 [Urine:2950] Intake/Output this shift: Total I/O In: 480 [P.O.:480] Out: 400 [Urine:400]  Physical exam the patient is alert and pleasant.  She is in no apparent distress.  Her strength is grossly normal in her gastrocnemius and dorsiflexors.  Lab Results: No results for input(s): WBC, HGB, HCT, PLT in the last 72 hours. BMET No results for input(s): NA, K, CL, CO2, GLUCOSE, BUN, CREATININE, CALCIUM in the last 72 hours.  Studies/Results: No results found.  Assessment/Plan: Postop day #7: I have encouraged the patient to continue to mobilize.  We are awaiting rehab placement.  I have answered all their questions.  I will add OxyContin to see if we can get better pain control.  LOS: 7 days     Ophelia Charter 05/01/2020, 2:16 PM

## 2020-05-01 NOTE — Plan of Care (Signed)
°  Problem: Education: Goal: Ability to verbalize activity precautions or restrictions will improve Outcome: Progressing Goal: Knowledge of the prescribed therapeutic regimen will improve Outcome: Progressing Goal: Understanding of discharge needs will improve Outcome: Progressing   Problem: Activity: Goal: Ability to avoid complications of mobility impairment will improve Outcome: Progressing Goal: Ability to tolerate increased activity will improve Outcome: Progressing Goal: Will remain free from falls Outcome: Progressing   Problem: Pain Management: Goal: Pain level will decrease Outcome: Progressing

## 2020-05-02 ENCOUNTER — Inpatient Hospital Stay (HOSPITAL_COMMUNITY)
Admission: RE | Admit: 2020-05-02 | Discharge: 2020-05-17 | DRG: 559 | Disposition: A | Discharge status: Home Health | Payer: Medicare Other | Source: Intra-hospital | Attending: Physical Medicine & Rehabilitation | Admitting: Physical Medicine & Rehabilitation

## 2020-05-02 ENCOUNTER — Other Ambulatory Visit: Payer: Self-pay

## 2020-05-02 ENCOUNTER — Inpatient Hospital Stay (HOSPITAL_COMMUNITY): Payer: Medicare Other

## 2020-05-02 DIAGNOSIS — G894 Chronic pain syndrome: Secondary | ICD-10-CM

## 2020-05-02 DIAGNOSIS — Z8679 Personal history of other diseases of the circulatory system: Secondary | ICD-10-CM

## 2020-05-02 DIAGNOSIS — I5042 Chronic combined systolic (congestive) and diastolic (congestive) heart failure: Secondary | ICD-10-CM | POA: Diagnosis present

## 2020-05-02 DIAGNOSIS — K5901 Slow transit constipation: Secondary | ICD-10-CM | POA: Diagnosis not present

## 2020-05-02 DIAGNOSIS — Z888 Allergy status to other drugs, medicaments and biological substances status: Secondary | ICD-10-CM

## 2020-05-02 DIAGNOSIS — E43 Unspecified severe protein-calorie malnutrition: Secondary | ICD-10-CM | POA: Diagnosis present

## 2020-05-02 DIAGNOSIS — E46 Unspecified protein-calorie malnutrition: Secondary | ICD-10-CM | POA: Diagnosis not present

## 2020-05-02 DIAGNOSIS — Z961 Presence of intraocular lens: Secondary | ICD-10-CM | POA: Diagnosis present

## 2020-05-02 DIAGNOSIS — E871 Hypo-osmolality and hyponatremia: Secondary | ICD-10-CM | POA: Diagnosis present

## 2020-05-02 DIAGNOSIS — K5900 Constipation, unspecified: Secondary | ICD-10-CM | POA: Diagnosis not present

## 2020-05-02 DIAGNOSIS — E8809 Other disorders of plasma-protein metabolism, not elsewhere classified: Secondary | ICD-10-CM | POA: Diagnosis present

## 2020-05-02 DIAGNOSIS — D75838 Other thrombocytosis: Secondary | ICD-10-CM | POA: Diagnosis not present

## 2020-05-02 DIAGNOSIS — Z923 Personal history of irradiation: Secondary | ICD-10-CM | POA: Diagnosis not present

## 2020-05-02 DIAGNOSIS — Z8249 Family history of ischemic heart disease and other diseases of the circulatory system: Secondary | ICD-10-CM

## 2020-05-02 DIAGNOSIS — K5903 Drug induced constipation: Secondary | ICD-10-CM | POA: Diagnosis present

## 2020-05-02 DIAGNOSIS — M48062 Spinal stenosis, lumbar region with neurogenic claudication: Secondary | ICD-10-CM | POA: Diagnosis present

## 2020-05-02 DIAGNOSIS — K5909 Other constipation: Secondary | ICD-10-CM

## 2020-05-02 DIAGNOSIS — R600 Localized edema: Secondary | ICD-10-CM

## 2020-05-02 DIAGNOSIS — Z6826 Body mass index (BMI) 26.0-26.9, adult: Secondary | ICD-10-CM

## 2020-05-02 DIAGNOSIS — Z87440 Personal history of urinary (tract) infections: Secondary | ICD-10-CM

## 2020-05-02 DIAGNOSIS — M5416 Radiculopathy, lumbar region: Secondary | ICD-10-CM | POA: Diagnosis present

## 2020-05-02 DIAGNOSIS — D62 Acute posthemorrhagic anemia: Secondary | ICD-10-CM | POA: Diagnosis present

## 2020-05-02 DIAGNOSIS — Z9842 Cataract extraction status, left eye: Secondary | ICD-10-CM

## 2020-05-02 DIAGNOSIS — Z86010 Personal history of colonic polyps: Secondary | ICD-10-CM

## 2020-05-02 DIAGNOSIS — E785 Hyperlipidemia, unspecified: Secondary | ICD-10-CM | POA: Diagnosis present

## 2020-05-02 DIAGNOSIS — N319 Neuromuscular dysfunction of bladder, unspecified: Secondary | ICD-10-CM | POA: Diagnosis present

## 2020-05-02 DIAGNOSIS — Z7984 Long term (current) use of oral hypoglycemic drugs: Secondary | ICD-10-CM

## 2020-05-02 DIAGNOSIS — I130 Hypertensive heart and chronic kidney disease with heart failure and stage 1 through stage 4 chronic kidney disease, or unspecified chronic kidney disease: Secondary | ICD-10-CM | POA: Diagnosis present

## 2020-05-02 DIAGNOSIS — M7989 Other specified soft tissue disorders: Secondary | ICD-10-CM | POA: Diagnosis not present

## 2020-05-02 DIAGNOSIS — Z9841 Cataract extraction status, right eye: Secondary | ICD-10-CM | POA: Diagnosis not present

## 2020-05-02 DIAGNOSIS — E1165 Type 2 diabetes mellitus with hyperglycemia: Secondary | ICD-10-CM | POA: Diagnosis present

## 2020-05-02 DIAGNOSIS — E11649 Type 2 diabetes mellitus with hypoglycemia without coma: Secondary | ICD-10-CM | POA: Diagnosis not present

## 2020-05-02 DIAGNOSIS — E876 Hypokalemia: Secondary | ICD-10-CM | POA: Diagnosis not present

## 2020-05-02 DIAGNOSIS — I10 Essential (primary) hypertension: Secondary | ICD-10-CM | POA: Diagnosis not present

## 2020-05-02 DIAGNOSIS — Z471 Aftercare following joint replacement surgery: Principal | ICD-10-CM

## 2020-05-02 DIAGNOSIS — Z803 Family history of malignant neoplasm of breast: Secondary | ICD-10-CM | POA: Diagnosis not present

## 2020-05-02 DIAGNOSIS — Z833 Family history of diabetes mellitus: Secondary | ICD-10-CM

## 2020-05-02 DIAGNOSIS — N1832 Chronic kidney disease, stage 3b: Secondary | ICD-10-CM | POA: Diagnosis not present

## 2020-05-02 DIAGNOSIS — E1122 Type 2 diabetes mellitus with diabetic chronic kidney disease: Secondary | ICD-10-CM | POA: Diagnosis present

## 2020-05-02 DIAGNOSIS — Z7982 Long term (current) use of aspirin: Secondary | ICD-10-CM

## 2020-05-02 DIAGNOSIS — Z800 Family history of malignant neoplasm of digestive organs: Secondary | ICD-10-CM | POA: Diagnosis not present

## 2020-05-02 DIAGNOSIS — E1169 Type 2 diabetes mellitus with other specified complication: Secondary | ICD-10-CM

## 2020-05-02 DIAGNOSIS — Z96653 Presence of artificial knee joint, bilateral: Secondary | ICD-10-CM | POA: Diagnosis present

## 2020-05-02 DIAGNOSIS — R609 Edema, unspecified: Secondary | ICD-10-CM | POA: Diagnosis not present

## 2020-05-02 DIAGNOSIS — Z8042 Family history of malignant neoplasm of prostate: Secondary | ICD-10-CM

## 2020-05-02 DIAGNOSIS — G8918 Other acute postprocedural pain: Secondary | ICD-10-CM | POA: Diagnosis not present

## 2020-05-02 DIAGNOSIS — H9190 Unspecified hearing loss, unspecified ear: Secondary | ICD-10-CM | POA: Diagnosis present

## 2020-05-02 DIAGNOSIS — K219 Gastro-esophageal reflux disease without esophagitis: Secondary | ICD-10-CM | POA: Diagnosis present

## 2020-05-02 DIAGNOSIS — I5043 Acute on chronic combined systolic (congestive) and diastolic (congestive) heart failure: Secondary | ICD-10-CM | POA: Diagnosis not present

## 2020-05-02 DIAGNOSIS — Z9119 Patient's noncompliance with other medical treatment and regimen: Secondary | ICD-10-CM

## 2020-05-02 DIAGNOSIS — Z79899 Other long term (current) drug therapy: Secondary | ICD-10-CM

## 2020-05-02 DIAGNOSIS — E669 Obesity, unspecified: Secondary | ICD-10-CM

## 2020-05-02 MED ORDER — VITAMIN D 25 MCG (1000 UNIT) PO TABS
2000.0000 [IU] | ORAL_TABLET | Freq: Every day | ORAL | Status: DC
  Administered 2020-05-03 – 2020-05-17 (×15): 2000 [IU] via ORAL
  Filled 2020-05-02 (×15): qty 2

## 2020-05-02 MED ORDER — ONDANSETRON HCL 4 MG PO TABS
4.0000 mg | ORAL_TABLET | Freq: Three times a day (TID) | ORAL | Status: DC | PRN
  Administered 2020-05-03 – 2020-05-15 (×8): 4 mg via ORAL
  Filled 2020-05-02 (×8): qty 1

## 2020-05-02 MED ORDER — BISACODYL 10 MG RE SUPP
10.0000 mg | Freq: Every day | RECTAL | Status: DC | PRN
  Administered 2020-05-05: 10 mg via RECTAL
  Filled 2020-05-02: qty 1

## 2020-05-02 MED ORDER — GABAPENTIN 300 MG PO CAPS
300.0000 mg | ORAL_CAPSULE | Freq: Every day | ORAL | Status: DC
  Administered 2020-05-02 – 2020-05-11 (×10): 300 mg via ORAL
  Filled 2020-05-02 (×10): qty 1

## 2020-05-02 MED ORDER — GLIPIZIDE 5 MG PO TABS
10.0000 mg | ORAL_TABLET | Freq: Every morning | ORAL | Status: DC
  Administered 2020-05-03 – 2020-05-17 (×15): 10 mg via ORAL
  Filled 2020-05-02 (×15): qty 2

## 2020-05-02 MED ORDER — TRAMADOL HCL 50 MG PO TABS
50.0000 mg | ORAL_TABLET | Freq: Three times a day (TID) | ORAL | Status: DC
  Administered 2020-05-03 – 2020-05-17 (×43): 50 mg via ORAL
  Filled 2020-05-02 (×44): qty 1

## 2020-05-02 MED ORDER — CYCLOBENZAPRINE HCL 10 MG PO TABS: 10.0000 mg | ORAL_TABLET | Freq: Three times a day (TID) | ORAL | 0 refills | Status: DC | PRN

## 2020-05-02 MED ORDER — FERROUS SULFATE 325 (65 FE) MG PO TABS
325.0000 mg | ORAL_TABLET | Freq: Two times a day (BID) | ORAL | Status: DC
  Administered 2020-05-03 – 2020-05-17 (×28): 325 mg via ORAL
  Filled 2020-05-02 (×28): qty 1

## 2020-05-02 MED ORDER — PANTOPRAZOLE SODIUM 40 MG PO TBEC
80.0000 mg | DELAYED_RELEASE_TABLET | Freq: Every day | ORAL | Status: DC
  Administered 2020-05-03 – 2020-05-04 (×2): 80 mg via ORAL
  Filled 2020-05-02 (×2): qty 2

## 2020-05-02 MED ORDER — GLIPIZIDE ER 5 MG PO TB24: 5.0000 mg | ORAL_TABLET | Freq: Every evening | ORAL | Status: DC

## 2020-05-02 MED ORDER — MELATONIN 3 MG PO TABS
3.0000 mg | ORAL_TABLET | Freq: Every evening | ORAL | Status: DC | PRN
  Administered 2020-05-03 – 2020-05-17 (×4): 3 mg via ORAL
  Filled 2020-05-02 (×4): qty 1

## 2020-05-02 MED ORDER — OXYCODONE HCL ER 15 MG PO T12A
15.0000 mg | EXTENDED_RELEASE_TABLET | Freq: Two times a day (BID) | ORAL | Status: DC
  Administered 2020-05-02 – 2020-05-09 (×14): 15 mg via ORAL
  Filled 2020-05-02 (×14): qty 1

## 2020-05-02 MED ORDER — PROSOURCE PLUS PO LIQD
30.0000 mL | Freq: Two times a day (BID) | ORAL | Status: DC
  Administered 2020-05-03 – 2020-05-17 (×21): 30 mL via ORAL
  Filled 2020-05-02 (×22): qty 30

## 2020-05-02 MED ORDER — FERROUS SULFATE 325 (65 FE) MG PO TABS: 325.0000 mg | ORAL_TABLET | Freq: Two times a day (BID) | ORAL | 3 refills | Status: DC

## 2020-05-02 MED ORDER — POLYETHYLENE GLYCOL 3350 17 G PO PACK
17.0000 g | PACK | Freq: Two times a day (BID) | ORAL | Status: DC
  Administered 2020-05-03 – 2020-05-08 (×11): 17 g via ORAL
  Filled 2020-05-02 (×13): qty 1

## 2020-05-02 MED ORDER — OXYCODONE HCL 5 MG PO TABS
5.0000 mg | ORAL_TABLET | ORAL | Status: DC | PRN
  Administered 2020-05-02 – 2020-05-17 (×32): 5 mg via ORAL
  Filled 2020-05-02 (×34): qty 1

## 2020-05-02 MED ORDER — NALOXONE HCL 0.4 MG/ML IJ SOLN: 0.4000 mg | INTRAMUSCULAR | Status: DC | PRN

## 2020-05-02 MED ORDER — INSULIN ASPART 100 UNIT/ML ~~LOC~~ SOLN
0.0000 [IU] | Freq: Every day | SUBCUTANEOUS | Status: DC
  Administered 2020-05-08: 2 [IU] via SUBCUTANEOUS

## 2020-05-02 MED ORDER — INSULIN ASPART 100 UNIT/ML ~~LOC~~ SOLN
0.0000 [IU] | Freq: Three times a day (TID) | SUBCUTANEOUS | Status: DC
  Administered 2020-05-04 – 2020-05-09 (×11): 1 [IU] via SUBCUTANEOUS
  Administered 2020-05-10: 2 [IU] via SUBCUTANEOUS
  Administered 2020-05-10: 1 [IU] via SUBCUTANEOUS
  Administered 2020-05-11: 2 [IU] via SUBCUTANEOUS
  Administered 2020-05-11 – 2020-05-12 (×2): 1 [IU] via SUBCUTANEOUS
  Administered 2020-05-13 – 2020-05-15 (×3): 2 [IU] via SUBCUTANEOUS
  Administered 2020-05-16: 1 [IU] via SUBCUTANEOUS
  Administered 2020-05-16: 2 [IU] via SUBCUTANEOUS
  Administered 2020-05-16 – 2020-05-17 (×2): 1 [IU] via SUBCUTANEOUS

## 2020-05-02 MED ORDER — CYCLOBENZAPRINE HCL 10 MG PO TABS
10.0000 mg | ORAL_TABLET | Freq: Three times a day (TID) | ORAL | Status: DC | PRN
  Administered 2020-05-12: 10 mg via ORAL
  Filled 2020-05-02: qty 1

## 2020-05-02 MED ORDER — OXYCODONE-ACETAMINOPHEN 5-325 MG PO TABS
1.0000 | ORAL_TABLET | ORAL | Status: DC | PRN
  Administered 2020-05-02 – 2020-05-17 (×36): 1 via ORAL
  Filled 2020-05-02 (×35): qty 1

## 2020-05-02 MED ORDER — OXYCODONE-ACETAMINOPHEN 5-325 MG PO TABS
1.0000 | ORAL_TABLET | ORAL | 0 refills | Status: DC | PRN
Start: 2020-05-02 — End: 2020-05-17

## 2020-05-02 MED ORDER — FUROSEMIDE 40 MG PO TABS
40.0000 mg | ORAL_TABLET | Freq: Every day | ORAL | Status: DC
  Administered 2020-05-03 – 2020-05-17 (×15): 40 mg via ORAL
  Filled 2020-05-02 (×15): qty 1

## 2020-05-02 MED ORDER — LIDOCAINE HCL URETHRAL/MUCOSAL 2 % EX GEL: CUTANEOUS | Status: DC | PRN

## 2020-05-02 MED ORDER — OXYCODONE HCL ER 20 MG PO T12A: 20.0000 mg | EXTENDED_RELEASE_TABLET | Freq: Two times a day (BID) | ORAL | 0 refills | Status: DC

## 2020-05-02 MED ORDER — ALUM & MAG HYDROXIDE-SIMETH 200-200-20 MG/5ML PO SUSP: 15.0000 mL | ORAL | Status: DC | PRN

## 2020-05-02 MED ORDER — ATORVASTATIN CALCIUM 10 MG PO TABS
10.0000 mg | ORAL_TABLET | Freq: Every day | ORAL | Status: DC
  Administered 2020-05-03 – 2020-05-17 (×15): 10 mg via ORAL
  Filled 2020-05-02 (×15): qty 1

## 2020-05-02 MED ORDER — DOCUSATE SODIUM 100 MG PO CAPS: 100.0000 mg | ORAL_CAPSULE | Freq: Two times a day (BID) | ORAL | 0 refills | Status: DC

## 2020-05-02 NOTE — Plan of Care (Signed)
Patient is currently waiting for bed placement in CIR. Pain control is the major issue and it is being managed with prn meds given as ordered every 3-4 hours. Patient sat up in the chair for a while and transferred back to bed with 1 assist and a walker. Back brace removed since patient was going to bed. Will continue to monitor and continue current POC.

## 2020-05-02 NOTE — Care Management Important Message (Signed)
Important Message  Patient Details  Name: [name] MRN: [id number] Date of Birth: [birth year]   Medicare Important Message Given:  Yes - Important Message mailed due to current National Emergency  Verbal consent obtained due to current National Emergency  Relationship to patient: Self Contact Name: Dulcey Riederer Call Date: 05/02/20  Time: 4445 Phone: [phone] Outcome: No Answer/Busy Important Message mailed to: Patient address on file    Delorse Lek 05/02/2020, 9:45 AM

## 2020-05-02 NOTE — Progress Notes (Signed)
Inpatient Rehab Admissions Coordinator:   I have a bed available for pt to admit to CIR today. Meghan, NP, in agreement.  Will let pt/family and TOC team know.   Shann Medal, PT, DPT Admissions Coordinator [phone] 05/02/20  9:56 AM

## 2020-05-02 NOTE — H&P (Signed)
Physical Medicine and Rehabilitation Admission H&P     CC: Functional deficits     HPI: [name] is an 86 year old female with history of T2DM, neurogenic bladder, OA, CKD, DDD with back pain and LE pain with neurogenic claudication due to L2-L5 stenosis with spondylolisthesis and was admitted on 04/24/20 for redo L2/3, L3/4 and L4/5 laminotomy, laminectomy with decompression of L3, L4 and L5 nerve roots by Dr. Arnoldo Morale. Post op course significant for issues with generalized weakness with hypotension, somnolence, acute on chronic renal failure with ABLA. Narcotics being titrated upwards for better pain control.  She continues to have limitations due to ongoing pain radiating LLE, poor standing balance as well as difficulty recalling precautions.   Has been limited due to back pain since June admission with limited mobility. Sponge bathes. Neighbors/Daughter has been assisting with meals/home since then.  CIR recommended due to functional deficits.        Review of Systems  Constitutional: Negative for chills and fever.  HENT: Positive for hearing loss. Negative for tinnitus.   Eyes: Negative for blurred vision and double vision.  Respiratory: Positive for cough and wheezing.   Cardiovascular: Negative for chest pain and palpitations.  Gastrointestinal: Positive for constipation. Negative for abdominal pain, heartburn and nausea.  Genitourinary: Negative for dysuria and urgency.  Musculoskeletal: Positive for back pain and myalgias.  Skin: Negative for rash.  Neurological: Negative for dizziness.  Psychiatric/Behavioral: The patient is nervous/anxious.        Has been confused per daughter          Past Medical History:  Diagnosis Date  . Anemia    . Arthritis    . Bilateral edema of lower extremity    . 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 Chronic lower back pain    . CKD (chronic kidney disease) stage 3, GFR 30-59 ml/min (HCC)    . DDD (degenerative disc disease)      lumbar  . Diverticulosis    . Family history of breast cancer    . Family history of pancreatic cancer    . Family history of prostate cancer    . Flaccid neuropathic bladder, not elsewhere classified    . Foley catheter in place    . GERD (gastroesophageal reflux disease)    . Hemorrhoids    . Hiatal hernia    . History of colon polyps    . History of radiation therapy 09-12-2015 to 10-12-2015    42.72 gray in 16 fractions directed right breast w/ boost of 12 gray in 6 fractions directed at the lumpectomy cavity- Total dose: 54.72y  . History of recurrent UTIs    . Hyperlipidemia    . PONV (postoperative nausea and vomiting)    . RBBB (right bundle branch block with left anterior fascicular block)    . Type 2 diabetes mellitus ([place])    . Urinary retention with incomplete bladder emptying    . Wears dentures      full upper and lower partial  . Wears glasses    . Wears hearing aid      bilateral but does not wear           Past Surgical History:  Procedure Laterality Date  .  BACK SURGERY      . BREAST LUMPECTOMY WITH RADIOACTIVE SEED LOCALIZATION Right 08/03/2015    Procedure: BREAST LUMPECTOMY WITH RADIOACTIVE SEED LOCALIZATION;  Surgeon: [name], MD;  Location: [place];  Service: General;  Laterality: Right;  . BREAST SURGERY      . CARDIOVASCULAR STRESS TEST   01/14/2012    Low risk nuclear study w/ small mild apical and apical lateral reversible perfusion defect represents a small area of ischemia versus shifting breast artifact/  normal LV funciton and wall motion, ef 86%  . CARPAL TUNNEL RELEASE Right 1977  . CATARACT EXTRACTION W/ INTRAOCULAR LENS  IMPLANT, BILATERAL Bilateral left 1996/  right 1997  . CLOSED RIGHT KNEE  MANIPULATION   08/11/2002    post TKA  . CYSTOSTOMY N/A 05/17/2016    Procedure: CYSTOSCOPY WITH  SUPRAPUBIC PLACMENT;  Surgeon: [name], MD;  Location: [facility];  Service: Urology;  Laterality: N/A;  . EYE SURGERY      . GANGLION CYST EXCISION Right 12/09/2001    right palm  . HERNIA REPAIR      . JOINT REPLACEMENT      . KNEE ARTHROSCOPY Right 12/14/2002    w/ Lysis Adhesions  . LAMINECTOMY   04/24/2020    Bilateral redo L2-3, L3-4 and L4-5 laminotomy/laminectomy/foraminotomies/medial facetectomy to decompress the bilateral L3, L4 and L5 nerve roots  . LUMBAR LAMINECTOMY/DECOMPRESSION MICRODISCECTOMY N/A 03/06/2016    Procedure: CENTRAL DECOMPRESSION L3 - L4 ,L4 - L5;  Surgeon: [name], MD;  Location: [facility];  Service: Orthopedics;  Laterality: N/A;  . SHOULDER HEMI-ARTHROPLASTY Right 02/20/2009    avascular necrosis   . TOTAL KNEE ARTHROPLASTY Bilateral right 06-23-2002/  left 07-11-2008  . [place]  . VAGINAL HYSTERECTOMY   1975           Family History  Problem Relation Age of Onset  . Pancreatic cancer Mother 9  . Diabetes Mother    . Prostate cancer Brother    . Diabetes Father    . Heart disease Father    . Diabetes Brother    . Heart attack Brother    . Diabetes Sister    . Diabetes Daughter    . Diabetes Son    . Coronary artery disease Brother          MI in his 20s  . Colon cancer Neg Hx    . Stomach cancer Neg Hx        Social History: Married but husband in poor health. Daughter checks in/assists in afternoons. Was at [facility] for a week in June --> home therapy till Sept.  She was independent with walker PTA. She worked for [employer] 40 years but reports that she has never smoked. She has never used smokeless tobacco. She reports that she does not drink alcohol and does not use drugs.          Allergies  Allergen Reactions  . Phenergan [Promethazine]        Slurred speech, acted very confused           Medications Prior to Admission  Medication Sig Dispense Refill  . acetaminophen (TYLENOL) 500 MG tablet Take 500 mg by mouth every 6 (six) hours as needed for mild pain or moderate pain.       Marland Kitchen atorvastatin (LIPITOR) 10 MG tablet Take 1 tablet (10 mg total) by mouth daily. 30 tablet 0  . bisacodyl (DULCOLAX)  10 MG suppository Place 1 suppository (10 mg total) rectally daily as needed for moderate constipation. 12 suppository 0  . esomeprazole (NEXIUM) 40 MG capsule TAKE 1 CAPSULE(40 MG) BY MOUTH DAILY (Patient taking differently: Take 40 mg by mouth daily. ) 90 capsule 1  . furosemide (LASIX) 40 MG tablet Take 1 tablet (40 mg total) by mouth daily. Or as directed 30 tablet 0  . gabapentin (NEURONTIN) 300 MG capsule Take 1 capsule (300 mg total) by mouth at bedtime. 90 capsule 1  . glipiZIDE (GLUCOTROL) 10 MG tablet Take 1 tablet (10 mg total) by mouth in the morning. 90 tablet 3  . ondansetron (ZOFRAN-ODT) 4 MG disintegrating tablet Take 1 tablet (4 mg total) by mouth every 8 (eight) hours as needed for nausea or vomiting. 15 tablet 0  . Potassium 99 MG TABS Take 1 tablet (99 mg total) by mouth daily. 30 tablet 0  . traMADol (ULTRAM) 50 MG tablet Take 50 mg by mouth 2 (two) times daily.      Marland Kitchen aspirin EC 81 MG tablet Take 81 mg by mouth daily at 12 noon.       . Cholecalciferol (VITAMIN D) 50 MCG (2000 UT) tablet Take 2,000 Units by mouth daily.      . cyanocobalamin 1000 MCG tablet Take 1,000 mcg by mouth at bedtime.       Marland Kitchen glipiZIDE (GLUCOTROL XL) 5 MG 24 hr tablet Take 1 tablet (5 mg total) by mouth every evening. (Patient not taking: Reported on 04/25/2020) 30 tablet 0  . Multiple Vitamins-Minerals (PRESERVISION AREDS PO) Take 1 capsule by mouth in the morning and at bedtime.      . polyethylene glycol (MIRALAX / GLYCOLAX) 17 g packet Take 17 g by mouth daily as needed for moderate constipation or severe constipation. (Patient not taking: Reported on 04/25/2020) 14 each 0      Drug  Regimen Review  Drug regimen was reviewed and remains appropriate with no significant issues identified   Home: Home Living Family/patient expects to be discharged to:: Private residence Living Arrangements: Spouse/significant other Available Help at Discharge: Family, Available 24 hours/day Type of Home: House Home Access: Ramped entrance Home Layout: One level Bathroom Shower/Tub: Multimedia programmer: Handicapped height Home Equipment: Environmental consultant - 2 wheels, Bedside commode, Shower seat Additional Comments: reports daughter can help a few days   Functional History: Prior Function Level of Independence: Needs assistance Gait / Transfers Assistance Needed: pt reports walking with RW since d/c from SNF a few months ago ADL's / Homemaking Assistance Needed: Pt reports assist for sponge bathing on posterior aspect of body, husband/daughter do cleaning and meal prep Comments: Pt reports 0 falls   Functional Status:  Mobility: Bed Mobility Overal bed mobility: Needs Assistance Bed Mobility: Rolling, Sidelying to Sit Rolling: Mod assist Sidelying to sit: Mod assist Supine to sit: Mod assist Sit to sidelying: Min assist General bed mobility comments: cues for best technique, assisted into hook lying before assist of rolling and side to sit. Transfers Overall transfer level: Needs assistance Equipment used: Rolling walker (2 wheeled) Transfers: Sit to/from Stand Sit to Stand: Min assist Stand pivot transfers: Min assist General transfer comment: cues for hand placement.  assist forward and some boost. Ambulation/Gait Ambulation/Gait assistance: Min guard, Min assist Gait Distance (Feet): 20 Feet (x2) Assistive device: Rolling walker (2 wheeled) Gait Pattern/deviations: Step-to pattern, Step-through pattern, Decreased stance time - left, Decreased step length - right, Decreased step length - left, Decreased stride length  General Gait Details: assist at times for stability,  advancing RW or help turning RW.  Postural  and RW proximity cues. Gait velocity: Decreased Gait velocity interpretation: <1.31 ft/sec, indicative of household ambulator   ADL: ADL Overall ADL's : Needs assistance/impaired Eating/Feeding: Set up, Bed level Eating/Feeding Details (indicate cue type and reason): supervision for safety as pt continues to be lethargic although dtr states pt always talks with her eyes closed Grooming: Supervision/safety, Set up, Sitting, Standing Grooming Details (indicate cue type and reason): education on not bending for ADLs, pt attempted to stand at sink for oral care but noted to lean on sink. deferred oral care to sitting in recliner. Upper Body Bathing: Moderate assistance Lower Body Bathing: Total assistance Lower Body Bathing Details (indicate cue type and reason): education provided on not bending, however pt states she does sponge baths Upper Body Dressing : Sitting, Total assistance Upper Body Dressing Details (indicate cue type and reason): unable to don brace , total (A)  Lower Body Dressing: Maximal assistance, Sit to/from stand, Cueing for safety, Cueing for sequencing, With adaptive equipment Lower Body Dressing Details (indicate cue type and reason): pt able to don pants with reacher with MAX A. pt required cues for sequencing of task and overall technique. pt reports she prefers to wear gowns at home Toilet Transfer: Minimal assistance, RW, Ambulation Toilet Transfer Details (indicate cue type and reason): cues for RW mgmt and cues to remind pt to keep eyes open during ambulation. pt reports she rarely sits on the toilet d/t her catheter and wearing diaper. Toileting - Clothing Manipulation Details (indicate cue type and reason): education and visual demo of maintaining back precautions during posterior pericare. pt able to demo lateral leans from recliner but would likely need MAX A for cleanliness. issued dtr and pt education on availbale DME for  pericare with pt and dtr verbalizing understanding Tub/Shower Transfer Details (indicate cue type and reason): pt reports completing sponge baths at home Functional mobility during ADLs: Minimal assistance, Cueing for safety, Rolling walker General ADL Comments: pt continues to be limited by lethargy, pain, back precautions and decreased ability to care for self impacting pts ability to complete BADLs   Cognition: Cognition Overall Cognitive Status: Difficult to assess Orientation Level: Oriented X4 Cognition Arousal/Alertness: Awake/alert Behavior During Therapy: Flat affect Overall Cognitive Status: Difficult to assess General Comments: e Difficult to assess due to: Hard of hearing/deaf   Physical Exam: Blood pressure (!) 100/50, pulse 99, temperature 98.8 F (37.1 C), temperature source Oral, resp. rate 18, height 5' 0.5" (1.537 m), weight 66.7 kg, SpO2 90 %. Physical Exam Vitals and nursing note reviewed.  Constitutional:      General: She is not in acute distress.    Appearance: Normal appearance. She is not ill-appearing.     Comments: Sitting up in chair.  HENT:     Head: Normocephalic.     Right Ear: Tympanic membrane normal.     Ears:     Comments: HOH    Nose: Nose normal.  Eyes:     Pupils: Pupils are equal, round, and reactive to light.  Cardiovascular:     Rate and Rhythm: Normal rate and regular rhythm.     Heart sounds: No murmur heard.   Pulmonary:     Effort: Pulmonary effort is normal.     Breath sounds: Wheezing (expiratory ) and rhonchi present. No rales.  Abdominal:     General: Bowel sounds are normal. There is distension.  Tenderness: There is no abdominal tenderness.  Musculoskeletal:     Cervical back: Normal range of motion.  Skin:    General: Skin is warm.     Comments: Back incision dressed  Neurological:     Mental Status: She is alert.     Comments: Alert and oriented x 3. HOH. Normal insight and awareness. Intact Memory. Normal  language and speech. Cranial nerve exam unremarkable. UE 5/5. LE: 2+/5 HF, 3- KE and 4/5 ADF/PF, with some pain inhibition on left. ?decreased LT along lateral leg into foot. DTR's tr to 1+ LE's  Psychiatric:     Comments: Flat, cooperative        Lab Results Last 48 Hours  No results found for this or any previous visit (from the past 48 hour(s)).   Imaging Results (Last 48 hours)  No results found.       Medical Problem List and Plan: 1.  Functional and mobility deficits secondary to lumbar stenosis with neurogenic claudication, s/p re-do laminectomy with decompression of bilateral L3,L4,L5 nerve roots on 04/24/20.             -patient may shower             -ELOS/Goals: 8-10 days 2.  Antithrombotics: -DVT/anticoagulation:  Mechanical: Sequential compression devices, below knee Bilateral lower extremities             -antiplatelet therapy: N/A 3. Pain Management:  On oxycodone 10 mg prn,  Vicodin 10 mg every 4 hours prn, IV morphine 4 mg prn ( 18 mg in last 24 hours), ultram prn. Oxycontin 15 mg bid added on 10/25. Daughter reports intermittent confusion. Will d/c Vicodin and use oxycodone (was using PTA) as will last longer--regimen discussed in detail with daughter. .  4. Mood: LCSW to follow for evaluation and support.              -antipsychotic agents: N/A 5. Neuropsych: This patient is not fully capable of making decisions on his own behalf. 6. Skin/Wound Care: Monitor wound for healing  7. Fluids/Electrolytes/Nutrition: Monitor I/O. Check lytes in am.  8.  T2DM: On glucotrol 10 mg am/5 mg pm--monitor for hypoglycemic episodes with rise in SCr. Will Monitor BS ac/hs and use SSI for elevated BS.  9.  Acute on chronic anemia: Has dropped from 11.1-->6.9-->7.4. Continue iron supplement bid-- will monitor H/H with serial checks. Monitor for signs of bleeding.  10.  CKD stage III: Baseline SCr-1.43-->1.54 on follow up. Will  11.  Flaccid neurogenic bladder.  Patient with chronic  Foley tube since 2017 back surgery.  DO NOT REMOVE FOLEY TUBE--gets foley change monthly at [facility]. 12.  Chronic BLE edema/Hypertension. Monitor BP tid--continue Lasix 40 mg daily.  13.  GERD: managed by protonix 14.  Hyperlipidemia.  Continue Lipitor .15. Chronic constipation:  Will add Miralax bid as colace ineffective.               -dulcolax suppository in AM             -pt states she has been eating, charted intake is fair           Bary Leriche, PA-C 05/02/2020  I have personally performed a face to face diagnostic evaluation of this patient and formulated the key components of the plan.  Additionally, I have personally reviewed laboratory data, imaging studies, as well as relevant notes and concur with the physician assistant's documentation above.  The patient's status has not changed from the  original H&P.  Any changes in documentation from the acute care chart have been noted above.  Meredith Staggers, MD, [facility]

## 2020-05-02 NOTE — Consult Note (Signed)
   [facility] CM Inpatient Consult   05/02/2020  [name] 06-Oct-1929 [id number]   Follow up:  [facility] Pending status  Patient is currently being recommended for an admission to inpatient rehab status at [facility].  Plan: Follow for transition and follow up with transitional care needs from inpatient rehab.  For questions, please contact:  Natividad Brood, RN BSN [facility] Liaison  [phone] business mobile phone Toll free office [phone]  Fax number: [phone] [email or web address]

## 2020-05-02 NOTE — Progress Notes (Signed)
PMR Admission Coordinator Pre-Admission Assessment   Patient: [name] is an 86 y.o., female MRN: [id number] DOB: [birth year] Height: 5' 0.5" (153.7 cm) Weight: 66.7 kg   Insurance Information HMO:     PPO:      PCP:      IPA:      80/20:      OTHER:  PRIMARY: Medicare A and B      Policy#: [id number]      Subscriber: pt CM Name:       Phone#:      Fax#:  Pre-Cert#: verified Civil engineer, contracting:  Benefits:  Phone #:      Name:  Eff. Date: 02/05/97     Deduct: $1484      Out of Pocket Max:      Life Max:  CIR: 100%      SNF: 20 full days Outpatient: 80%     Co-Pay: 20% Home Health: 100%      Co-Pay:  DME: 80%     Co-Pay: 20% Providers: pt choice SECONDARY: BCBS      Policy#: [id number]     Phone#: [phone]   Financial Counselor:       Phone#:    The "Data Collection Information Summary" for patients in Inpatient Rehabilitation Facilities with attached "Privacy Act [place] Records" was provided and verbally reviewed with: Patient and Family   Emergency Contact Information         Contact Information     Name Relation Home Work Mobile    [place] Daughter [phone]   [phone]    Lyndy, Russman [phone]             Current Medical History  Patient Admitting Diagnosis: debility following L2-L5 lami/fusion   History of Present Illness: [name] is an 86 year old right-handed female with history of chronic anemia, breast cancer of upper outer quadrant of right breast 2017 status post lumpectomy with radiation therapy followed by oncology services, CKD stage III with creatinine baseline 1.43, type 2 diabetes mellitus, hyperlipidemia, right shoulder arthroplasty, 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  History of chronic back pain with decompression microdiscectomy 2017.  Presented 04/24/2020 with complaints of increasing back pain radiating to the lower extremities.  MRI and imaging lumbar showed multilevel lumbar  spinal listhesis and spinal stenosis.  Patient underwent bilateral redo of L2--3, 3-4 and L4-5 laminotomy/laminectomy/foraminotomies to decompress the bilateral L3-4 and L5 nerve roots as well as transforaminal lumbar interbody fusion 04/24/2020 per Dr. Arnoldo Morale.  Back brace when out of bed.  Hospital course anemia 6.9-7.5 as well as bouts of hypotension.  Bouts of hypoglycemia 04/28/2020, down to 35, and received diabetic gel.  Therapy evaluations completed and patient was recommended for a comprehensive rehab program.   Patient's medical record from [facility] has been reviewed by the rehabilitation admission coordinator and physician.   Past Medical History      Past Medical History:  Diagnosis Date  . Anemia    . Arthritis    . Bilateral edema of lower extremity    . 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 Chronic lower back pain    . CKD (chronic kidney disease) stage 3, GFR 30-59 ml/min (HCC)    . DDD (  degenerative disc disease)      lumbar  . Diverticulosis    . Family history of breast cancer    . Family history of pancreatic cancer    . Family history of prostate cancer    . Flaccid neuropathic bladder, not elsewhere classified    . Foley catheter in place    . GERD (gastroesophageal reflux disease)    . Hemorrhoids    . Hiatal hernia    . History of colon polyps    . History of radiation therapy 09-12-2015 to 10-12-2015    42.72 gray in 16 fractions directed right breast w/ boost of 12 gray in 6 fractions directed at the lumpectomy cavity- Total dose: 54.72y  . History of recurrent UTIs    . Hyperlipidemia    . PONV (postoperative nausea and vomiting)    . RBBB (right bundle branch block with left anterior fascicular block)    . Type 2 diabetes mellitus  ([place])    . Urinary retention with incomplete bladder emptying    . Wears dentures      full upper and lower partial  . Wears glasses    . Wears hearing aid      bilateral but does not wear      Family History   family history includes Coronary artery disease in her brother; Diabetes in her brother, daughter, father, mother, sister, and son; Heart attack in her brother; Heart disease in her father; Pancreatic cancer (age of onset: 59) in her mother; Prostate cancer in her brother.   Prior Rehab/Hospitalizations Has the patient had prior rehab or hospitalizations prior to admission? Yes   Has the patient had major surgery during 100 days prior to admission? Yes              Current Medications   Current Facility-Administered Medications:  .  0.9 %  sodium chloride infusion, 250 mL, Intravenous, Continuous, Newman Pies, MD .  acetaminophen (TYLENOL) tablet 650 mg, 650 mg, Oral, Q4H PRN, 650 mg at 04/28/20 2325 **OR** acetaminophen (TYLENOL) suppository 650 mg, 650 mg, Rectal, Q4H PRN, Newman Pies, MD .  atorvastatin (LIPITOR) tablet 10 mg, 10 mg, Oral, Daily, Newman Pies, MD, 10 mg at 05/02/20 0841 .  bisacodyl (DULCOLAX) suppository 10 mg, 10 mg, Rectal, Daily PRN, Newman Pies, MD .  Chlorhexidine Gluconate Cloth 2 % PADS 6 each, 6 each, Topical, Daily, Newman Pies, MD, 6 each at 05/02/20 [phone] .  cholecalciferol (VITAMIN D3) tablet 2,000 Units, 2,000 Units, Oral, Daily, Newman Pies, MD, 2,000 Units at 05/02/20 0840 .  cyclobenzaprine (FLEXERIL) tablet 10 mg, 10 mg, Oral, TID PRN, Newman Pies, MD, 10 mg at 04/26/20 2109 .  docusate sodium (COLACE) capsule 100 mg, 100 mg, Oral, BID, Newman Pies, MD, 100 mg at 05/02/20 0841 .  ferrous sulfate tablet 325 mg, 325 mg, Oral, BID WC, Newman Pies, MD, 325 mg at 05/02/20 0841 .  furosemide (LASIX) tablet 40 mg, 40 mg, Oral, Daily, Newman Pies, MD, 40 mg at 05/02/20 0841 .  gabapentin (NEURONTIN)  capsule 300 mg, 300 mg, Oral, QHS, Newman Pies, MD, 300 mg at 05/01/20 2209 .  glipiZIDE (GLUCOTROL XL) 24 hr tablet 5 mg, 5 mg, Oral, QPM, Newman Pies, MD, 5 mg at 05/01/20 1636 .  glipiZIDE (GLUCOTROL) tablet 10 mg, 10 mg, Oral, q AM, Newman Pies, MD, 10 mg at 05/02/20 1017 .  HYDROcodone-acetaminophen (NORCO/VICODIN) 5-325 MG per tablet 1-2 tablet, 1-2 tablet, Oral, Q4H PRN, Newman Pies, MD, 2 tablet  at 05/02/20 0656 .  menthol-cetylpyridinium (CEPACOL) lozenge 3 mg, 1 lozenge, Oral, PRN, 3 mg at 04/28/20 1318 **OR** phenol (CHLORASEPTIC) mouth spray 1 spray, 1 spray, Mouth/Throat, PRN, Newman Pies, MD .  morphine 4 MG/ML injection 4 mg, 4 mg, Intravenous, Q2H PRN, Newman Pies, MD, 4 mg at 05/02/20 0528 .  ondansetron (ZOFRAN) tablet 4 mg, 4 mg, Oral, Q6H PRN **OR** ondansetron (ZOFRAN) injection 4 mg, 4 mg, Intravenous, Q6H PRN, Newman Pies, MD, 4 mg at 04/26/20 3532 .  oxyCODONE-acetaminophen (PERCOCET/ROXICET) 5-325 MG per tablet 1 tablet, 1 tablet, Oral, Q4H PRN, 1 tablet at 05/02/20 1017 **AND** oxyCODONE (Oxy IR/ROXICODONE) immediate release tablet 5 mg, 5 mg, Oral, Q4H PRN, Hammons, Kimberly B, RPH, 5 mg at 05/02/20 0207 .  oxyCODONE (OXYCONTIN) 12 hr tablet 15 mg, 15 mg, Oral, Q12H, Newman Pies, MD, 15 mg at 05/02/20 0841 .  pantoprazole (PROTONIX) EC tablet 80 mg, 80 mg, Oral, Q1200, Newman Pies, MD, 80 mg at 05/01/20 1138 .  polyethylene glycol (MIRALAX / GLYCOLAX) packet 17 g, 17 g, Oral, Daily PRN, Newman Pies, MD, 17 g at 04/30/20 2326 .  sodium chloride flush (NS) 0.9 % injection 3 mL, 3 mL, Intravenous, Q12H, Newman Pies, MD, 3 mL at 05/01/20 2209 .  sodium chloride flush (NS) 0.9 % injection 3 mL, 3 mL, Intravenous, PRN, Newman Pies, MD, 3 mL at 04/29/20 2200 .  traMADol (ULTRAM) tablet 50 mg, 50 mg, Oral, BID, Newman Pies, MD, 50 mg at 05/02/20 0841 .  zolpidem (AMBIEN) tablet 5 mg, 5 mg, Oral, QHS PRN, Newman Pies, MD   Patients Current Diet:     Diet Order                      Diet Carb Modified Fluid consistency: Thin; Room service appropriate? Yes  Diet effective now                      Precautions / Restrictions Precautions Precautions: Fall, Back Precaution Booklet Issued: Yes (comment) Precaution Comments: Requires cues throughout session for precautions Spinal Brace: Lumbar corset, Applied in sitting position Restrictions Weight Bearing Restrictions: No    Has the patient had 2 or more falls or a fall with injury in the past year? No   Prior Activity Level Limited Community (1-2x/wk): limited to doctors appointments following discharge from SNF over the summer; states limited mostly by decreased endurance and pain; using RW, not driving, bird bathes with occasional assist from family   Prior Functional Level Self Care: Did the patient need help bathing, dressing, using the toilet or eating? Needed some help   Indoor Mobility: Did the patient need assistance with walking from room to room (with or without device)? Independent   Stairs: Did the patient need assistance with internal or external stairs (with or without device)? Needed some help   Functional Cognition: Did the patient need help planning regular tasks such as shopping or remembering to take medications? Needed some help   Home Assistive Devices / [place] Devices/Equipment: Gilford Rile (specify type) Home Equipment: Walker - 2 wheels, Bedside commode, Shower seat   Prior Device Use: Indicate devices/aids used by the patient prior to current illness, exacerbation or injury? Walker   Current Functional Level Cognition   Overall Cognitive Status: Difficult to assess Difficult to assess due to: Hard of hearing/deaf Orientation Level: Oriented X4 General Comments: e    Extremity Assessment (includes Sensation/Coordination)   Upper Extremity Assessment:  Generalized weakness  Lower Extremity  Assessment: Defer to PT evaluation LLE Deficits / Details: radicular pain L lower leg LLE: Unable to fully assess due to pain     ADLs   Overall ADL's : Needs assistance/impaired Eating/Feeding: Set up, Bed level Eating/Feeding Details (indicate cue type and reason): supervision for safety as pt continues to be lethargic although dtr states pt always talks with her eyes closed Grooming: Supervision/safety, Set up, Sitting, Standing Grooming Details (indicate cue type and reason): education on not bending for ADLs, pt attempted to stand at sink for oral care but noted to lean on sink. deferred oral care to sitting in recliner. Upper Body Bathing: Moderate assistance Lower Body Bathing: Total assistance Lower Body Bathing Details (indicate cue type and reason): education provided on not bending, however pt states she does sponge baths Upper Body Dressing : Sitting, Total assistance Upper Body Dressing Details (indicate cue type and reason): unable to don brace , total (A)  Lower Body Dressing: Maximal assistance, Sit to/from stand, Cueing for safety, Cueing for sequencing, With adaptive equipment Lower Body Dressing Details (indicate cue type and reason): pt able to don pants with reacher with MAX A. pt required cues for sequencing of task and overall technique. pt reports she prefers to wear gowns at home Toilet Transfer: Minimal assistance, RW, Ambulation Toilet Transfer Details (indicate cue type and reason): cues for RW mgmt and cues to remind pt to keep eyes open during ambulation. pt reports she rarely sits on the toilet d/t her catheter and wearing diaper. Toileting - Clothing Manipulation Details (indicate cue type and reason): education and visual demo of maintaining back precautions during posterior pericare. pt able to demo lateral leans from recliner but would likely need MAX A for cleanliness. issued dtr and pt education on availbale DME for pericare with pt and dtr verbalizing  understanding Tub/Shower Transfer Details (indicate cue type and reason): pt reports completing sponge baths at home Functional mobility during ADLs: Minimal assistance, Cueing for safety, Rolling walker General ADL Comments: pt continues to be limited by lethargy, pain, back precautions and decreased ability to care for self impacting pts ability to complete BADLs     Mobility   Overal bed mobility: Needs Assistance Bed Mobility: Rolling, Sidelying to Sit Rolling: Mod assist Sidelying to sit: Mod assist Supine to sit: Mod assist Sit to sidelying: Min assist General bed mobility comments: cues for best technique, assisted into hook lying before assist of rolling and side to sit.     Transfers   Overall transfer level: Needs assistance Equipment used: Rolling walker (2 wheeled) Transfers: Sit to/from Stand Sit to Stand: Min assist Stand pivot transfers: Min assist General transfer comment: cues for hand placement.  assist forward and some boost.     Ambulation / Gait / Stairs / Wheelchair Mobility   Ambulation/Gait Ambulation/Gait assistance: Min guard, Min assist Gait Distance (Feet): 20 Feet (x2) Assistive device: Rolling walker (2 wheeled) Gait Pattern/deviations: Step-to pattern, Step-through pattern, Decreased stance time - left, Decreased step length - right, Decreased step length - left, Decreased stride length General Gait Details: assist at times for stability, advancing RW or help turning RW.  Postural  and RW proximity cues. Gait velocity: Decreased Gait velocity interpretation: <1.31 ft/sec, indicative of household ambulator     Posture / Balance Dynamic Sitting Balance Sitting balance - Comments: prefers 1 UE assist, but can sit unsupported to work on donning the brace. Balance Overall balance assessment: Needs assistance Sitting-balance support: No upper  extremity supported, Single extremity supported Sitting balance-Leahy Scale: Fair Sitting balance - Comments:  prefers 1 UE assist, but can sit unsupported to work on donning the brace. Postural control: Posterior lean Standing balance support: Bilateral upper extremity supported, Single extremity supported Standing balance-Leahy Scale: Poor Standing balance comment: Reliant on RW for support, needs assist to manage AD     Special needs/care consideration Diabetic management yes and Special service needs chronic foley    Previous Home Environment (from acute therapy documentation) Living Arrangements: Spouse/significant other Available Help at Discharge: Family, Available 24 hours/day Type of Home: House Home Layout: One level Home Access: Ramped entrance Bathroom Shower/Tub: Multimedia programmer: Handicapped height Home Care Services: No Additional Comments: reports daughter can help a few days   Discharge Living Setting Plans for Discharge Living Setting: Lives with (comment) (spouse and daughter) Type of Home at Discharge: House Discharge Home Layout: One level Discharge Home Access: [place] entrance Discharge Bathroom Shower/Tub: Walk-in shower Discharge Bathroom Toilet: Standard Discharge Bathroom Accessibility: Yes How Accessible: Accessible via walker Does the patient have any problems obtaining your medications?: No   Social/Family/Support Systems Patient Roles: Spouse Contact Information: [place] Anticipated Caregiver: Festus Barren (daughter( Anticipated Caregiver's Contact Information: [phone] (angela); [phone] (spouse) Ability/Limitations of Caregiver: min assist Caregiver Availability: 24/7 Discharge Plan Discussed with Primary Caregiver: Yes Is Caregiver In Agreement with Plan?: Yes Does Caregiver/Family have Issues with Lodging/Transportation while Pt is in Rehab?: No   Goals Patient/Family Goal for Rehab: PT/OT supervision to mod I, SLP n/a Expected length of stay: 8-10 days Additional Information: per daughter, pt does keep her eyes closed  when talking sometimes Pt/Family Agrees to Admission and willing to participate: Yes Program Orientation Provided & Reviewed with Pt/Caregiver Including Roles  & Responsibilities: Yes   Decrease burden of Care through IP rehab admission: n/a   Possible need for SNF placement upon discharge: Not anticipated   Patient Condition: I have reviewed medical records from [facility], spoken with CM, and patient and daughter. I met with patient at the bedside for inpatient rehabilitation assessment.  Patient will benefit from ongoing PT and OT, can actively participate in 3 hours of therapy a day 5 days of the week, and can make measurable gains during the admission.  Patient will also benefit from the coordinated team approach during an Inpatient Acute Rehabilitation admission.  The patient will receive intensive therapy as well as Rehabilitation physician, nursing, social worker, and care management interventions.  Due to bladder management, safety, skin/wound care, disease management, medication administration, pain management and patient education the patient requires 24 hour a day rehabilitation nursing.  The patient is currently min to mod assist with mobility and basic ADLs.  Discharge setting and therapy post discharge at home is anticipated.  Patient has agreed to participate in the Acute Inpatient Rehabilitation Program and will admit today.   Preadmission Screen Completed GG:YIRSWNI E Cletus Gash, 05/02/2020 10:49 AM ______________________________________________________________________   Discussed status with Dr. Naaman Plummer on 05/02/20  at 10:49 AM  and received approval for admission today.   Admission Coordinator: Michel Santee, PT, time 10:49 AM Sudie Grumbling 05/02/20     Assessment/Plan: Diagnosis: debility and functional deficits after L2-5 laminectomy and fusion 1. Does the need for close, 24 hr/day Medical supervision in concert with the patient's rehab needs make it unreasonable for this  patient to be served in a less intensive setting? Yes 2. Co-Morbidities requiring supervision/potential complications: CKD III, breast CA, pain mgt, hypotension 3. Due to bladder  management, bowel management, safety, skin/wound care, disease management, medication administration, pain management and patient education, does the patient require 24 hr/day rehab nursing? Yes 4. Does the patient require coordinated care of a physician, rehab nurse, PT, OT, and SLP to address physical and functional deficits in the context of the above medical diagnosis(es)? Yes Addressing deficits in the following areas: balance, endurance, locomotion, strength, transferring, bowel/bladder control, bathing, dressing, feeding, grooming, toileting and psychosocial support 5. Can the patient actively participate in an intensive therapy program of at least 3 hrs of therapy 5 days a week? Yes 6. The potential for patient to make measurable gains while on inpatient rehab is excellent 7. Anticipated functional outcomes upon discharge from inpatient rehab: modified independent and supervision PT, modified independent and supervision OT, n/a SLP 8. Estimated rehab length of stay to reach the above functional goals is: 8-10 days 9. Anticipated discharge destination: Home 10. Overall Rehab/Functional Prognosis: excellent     MD Signature: Meredith Staggers, MD, [place]

## 2020-05-02 NOTE — H&P (Signed)
Physical Medicine and Rehabilitation Admission H&P    CC: Functional deficits   HPI: [name] is an 86 year old female with history of T2DM, neurogenic bladder, OA, CKD, DDD with back pain and LE pain with neurogenic claudication due to L2-L5 stenosis with spondylolisthesis and was admitted on 04/24/20 for redo L2/3, L3/4 and L4/5 laminotomy, laminectomy with decompression of L3, L4 and L5 nerve roots by Dr. Arnoldo Morale. Post op course significant for issues with generalized weakness with hypotension, somnolence, acute on chronic renal failure with ABLA. Narcotics being titrated upwards for better pain control.  She continues to have limitations due to ongoing pain radiating LLE, poor standing balance as well as difficulty recalling precautions.   Has been limited due to back pain since June admission with limited mobility. Sponge bathes. Neighbors/Daughter has been assisting with meals/home since then.  CIR recommended due to functional deficits.     Review of Systems  Constitutional: Negative for chills and fever.  HENT: Positive for hearing loss. Negative for tinnitus.   Eyes: Negative for blurred vision and double vision.  Respiratory: Positive for cough and wheezing.   Cardiovascular: Negative for chest pain and palpitations.  Gastrointestinal: Positive for constipation. Negative for abdominal pain, heartburn and nausea.  Genitourinary: Negative for dysuria and urgency.  Musculoskeletal: Positive for back pain and myalgias.  Skin: Negative for rash.  Neurological: Negative for dizziness.  Psychiatric/Behavioral: The patient is nervous/anxious.        Has been confused per daughter     Past Medical History:  Diagnosis Date  . Anemia   . Arthritis   . Bilateral edema of lower extremity   . 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 Chronic lower back pain   . CKD (chronic kidney disease) stage 3, GFR 30-59 ml/min (HCC)   . DDD (degenerative disc disease)    lumbar  . Diverticulosis   . Family history of breast cancer   . Family history of pancreatic cancer   . Family history of prostate cancer   . Flaccid neuropathic bladder, not elsewhere classified   . Foley catheter in place   . GERD (gastroesophageal reflux disease)   . Hemorrhoids   . Hiatal hernia   . History of colon polyps   . History of radiation therapy 09-12-2015 to 10-12-2015   42.72 gray in 16 fractions directed right breast w/ boost of 12 gray in 6 fractions directed at the lumpectomy cavity- Total dose: 54.72y  . History of recurrent UTIs   . Hyperlipidemia   . PONV (postoperative nausea and vomiting)   . RBBB (right bundle branch block with left anterior fascicular block)   . Type 2 diabetes mellitus ([place])   . Urinary retention with incomplete bladder emptying   . Wears dentures    full upper and lower partial  . Wears glasses   . Wears hearing aid    bilateral but does not wear    Past Surgical History:  Procedure Laterality Date  . BACK SURGERY    . BREAST LUMPECTOMY WITH RADIOACTIVE SEED LOCALIZATION Right 08/03/2015   Procedure: BREAST LUMPECTOMY WITH RADIOACTIVE SEED LOCALIZATION;  Surgeon: [name], MD;  Location: [place];  Service: General;  Laterality: Right;  . BREAST SURGERY    .  CARDIOVASCULAR STRESS TEST  01/14/2012   Low risk nuclear study w/ small mild apical and apical lateral reversible perfusion defect represents a small area of ischemia versus shifting breast artifact/  normal LV funciton and wall motion, ef 86%  . CARPAL TUNNEL RELEASE Right 1977  . CATARACT EXTRACTION W/ INTRAOCULAR LENS  IMPLANT, BILATERAL Bilateral left 1996/  right 1997  . CLOSED RIGHT KNEE MANIPULATION  08/11/2002   post TKA  . CYSTOSTOMY N/A  05/17/2016   Procedure: CYSTOSCOPY WITH  SUPRAPUBIC PLACMENT;  Surgeon: [name], MD;  Location[facility];  Service: Urology;  Laterality: N/A;  . EYE SURGERY    . GANGLION CYST EXCISION Right 12/09/2001   right palm  . HERNIA REPAIR    . JOINT REPLACEMENT    . KNEE ARTHROSCOPY Right 12/14/2002   w/ Lysis Adhesions  . LAMINECTOMY  04/24/2020   Bilateral redo L2-3, L3-4 and L4-5 laminotomy/laminectomy/foraminotomies/medial facetectomy to decompress the bilateral L3, L4 and L5 nerve roots  . LUMBAR LAMINECTOMY/DECOMPRESSION MICRODISCECTOMY N/A 03/06/2016   Procedure: CENTRAL DECOMPRESSION L3 - L4 ,L4 - L5;  Surgeon: [name], MD;  Location: [facility];  Service: Orthopedics;  Laterality: N/A;  . SHOULDER HEMI-ARTHROPLASTY Right 02/20/2009   avascular necrosis   . TOTAL KNEE ARTHROPLASTY Bilateral right 06-23-2002/  left 07-11-2008  . [place]  . VAGINAL HYSTERECTOMY  1975    Family History  Problem Relation Age of Onset  . Pancreatic cancer Mother 45  . Diabetes Mother   . Prostate cancer Brother   . Diabetes Father   . Heart disease Father   . Diabetes Brother   . Heart attack Brother   . Diabetes Sister   . Diabetes Daughter   . Diabetes Son   . Coronary artery disease Brother        MI in his 68s  . Colon cancer Neg Hx   . Stomach cancer Neg Hx     Social History: Married but husband in poor health. Daughter checks in/assists in afternoons. Was at [facility] for a week in June --> home therapy till Sept.  She was independent with walker PTA. She worked for [employer] 40 years but reports that she has never smoked. She has never used smokeless tobacco. She reports that she does not drink alcohol and does not use drugs.   Allergies  Allergen Reactions  . Phenergan [Promethazine]     Slurred speech, acted very confused   Medications Prior to Admission  Medication Sig Dispense Refill  . acetaminophen (TYLENOL) 500 MG tablet  Take 500 mg by mouth every 6 (six) hours as needed for mild pain or moderate pain.     Marland Kitchen atorvastatin (LIPITOR) 10 MG tablet Take 1 tablet (10 mg total) by mouth daily. 30 tablet 0  . bisacodyl (DULCOLAX) 10 MG suppository Place 1 suppository (10 mg total) rectally daily as needed for moderate constipation. 12 suppository 0  . esomeprazole (NEXIUM) 40 MG capsule TAKE 1 CAPSULE(40 MG) BY MOUTH DAILY (Patient taking differently: Take 40 mg by mouth daily. ) 90 capsule 1  . furosemide (LASIX) 40 MG tablet Take 1 tablet (40 mg total) by mouth daily. Or as directed 30 tablet 0  . gabapentin (NEURONTIN) 300 MG capsule Take 1 capsule (300 mg total) by mouth at bedtime. 90 capsule 1  . glipiZIDE (GLUCOTROL) 10 MG tablet Take 1 tablet (10 mg total) by mouth in the morning. 90 tablet 3  . ondansetron (ZOFRAN-ODT)  4 MG disintegrating tablet Take 1 tablet (4 mg total) by mouth every 8 (eight) hours as needed for nausea or vomiting. 15 tablet 0  . Potassium 99 MG TABS Take 1 tablet (99 mg total) by mouth daily. 30 tablet 0  . traMADol (ULTRAM) 50 MG tablet Take 50 mg by mouth 2 (two) times daily.    Marland Kitchen aspirin EC 81 MG tablet Take 81 mg by mouth daily at 12 noon.     . Cholecalciferol (VITAMIN D) 50 MCG (2000 UT) tablet Take 2,000 Units by mouth daily.    . cyanocobalamin 1000 MCG tablet Take 1,000 mcg by mouth at bedtime.     Marland Kitchen glipiZIDE (GLUCOTROL XL) 5 MG 24 hr tablet Take 1 tablet (5 mg total) by mouth every evening. (Patient not taking: Reported on 04/25/2020) 30 tablet 0  . Multiple Vitamins-Minerals (PRESERVISION AREDS PO) Take 1 capsule by mouth in the morning and at bedtime.    . polyethylene glycol (MIRALAX / GLYCOLAX) 17 g packet Take 17 g by mouth daily as needed for moderate constipation or severe constipation. (Patient not taking: Reported on 04/25/2020) 14 each 0    Drug Regimen Review  Drug regimen was reviewed and remains appropriate with no significant issues identified  Home: Home  Living Family/patient expects to be discharged to:: Private residence Living Arrangements: Spouse/significant other Available Help at Discharge: Family, Available 24 hours/day Type of Home: House Home Access: Ramped entrance Home Layout: One level Bathroom Shower/Tub: Multimedia programmer: Handicapped height Home Equipment: Environmental consultant - 2 wheels, Bedside commode, Shower seat Additional Comments: reports daughter can help a few days   Functional History: Prior Function Level of Independence: Needs assistance Gait / Transfers Assistance Needed: pt reports walking with RW since d/c from SNF a few months ago ADL's / Homemaking Assistance Needed: Pt reports assist for sponge bathing on posterior aspect of body, husband/daughter do cleaning and meal prep Comments: Pt reports 0 falls  Functional Status:  Mobility: Bed Mobility Overal bed mobility: Needs Assistance Bed Mobility: Rolling, Sidelying to Sit Rolling: Mod assist Sidelying to sit: Mod assist Supine to sit: Mod assist Sit to sidelying: Min assist General bed mobility comments: cues for best technique, assisted into hook lying before assist of rolling and side to sit. Transfers Overall transfer level: Needs assistance Equipment used: Rolling walker (2 wheeled) Transfers: Sit to/from Stand Sit to Stand: Min assist Stand pivot transfers: Min assist General transfer comment: cues for hand placement.  assist forward and some boost. Ambulation/Gait Ambulation/Gait assistance: Min guard, Min assist Gait Distance (Feet): 20 Feet (x2) Assistive device: Rolling walker (2 wheeled) Gait Pattern/deviations: Step-to pattern, Step-through pattern, Decreased stance time - left, Decreased step length - right, Decreased step length - left, Decreased stride length General Gait Details: assist at times for stability, advancing RW or help turning RW.  Postural  and RW proximity cues. Gait velocity: Decreased Gait velocity  interpretation: <1.31 ft/sec, indicative of household ambulator    ADL: ADL Overall ADL's : Needs assistance/impaired Eating/Feeding: Set up, Bed level Eating/Feeding Details (indicate cue type and reason): supervision for safety as pt continues to be lethargic although dtr states pt always talks with her eyes closed Grooming: Supervision/safety, Set up, Sitting, Standing Grooming Details (indicate cue type and reason): education on not bending for ADLs, pt attempted to stand at sink for oral care but noted to lean on sink. deferred oral care to sitting in recliner. Upper Body Bathing: Moderate assistance Lower Body Bathing: Total assistance Lower  Body Bathing Details (indicate cue type and reason): education provided on not bending, however pt states she does sponge baths Upper Body Dressing : Sitting, Total assistance Upper Body Dressing Details (indicate cue type and reason): unable to don brace , total (A)  Lower Body Dressing: Maximal assistance, Sit to/from stand, Cueing for safety, Cueing for sequencing, With adaptive equipment Lower Body Dressing Details (indicate cue type and reason): pt able to don pants with reacher with MAX A. pt required cues for sequencing of task and overall technique. pt reports she prefers to wear gowns at home Toilet Transfer: Minimal assistance, RW, Ambulation Toilet Transfer Details (indicate cue type and reason): cues for RW mgmt and cues to remind pt to keep eyes open during ambulation. pt reports she rarely sits on the toilet d/t her catheter and wearing diaper. Toileting - Clothing Manipulation Details (indicate cue type and reason): education and visual demo of maintaining back precautions during posterior pericare. pt able to demo lateral leans from recliner but would likely need MAX A for cleanliness. issued dtr and pt education on availbale DME for pericare with pt and dtr verbalizing understanding Tub/Shower Transfer Details (indicate cue type and  reason): pt reports completing sponge baths at home Functional mobility during ADLs: Minimal assistance, Cueing for safety, Rolling walker General ADL Comments: pt continues to be limited by lethargy, pain, back precautions and decreased ability to care for self impacting pts ability to complete BADLs  Cognition: Cognition Overall Cognitive Status: Difficult to assess Orientation Level: Oriented X4 Cognition Arousal/Alertness: Awake/alert Behavior During Therapy: Flat affect Overall Cognitive Status: Difficult to assess General Comments: e Difficult to assess due to: Hard of hearing/deaf  Physical Exam: Blood pressure (!) 100/50, pulse 99, temperature 98.8 F (37.1 C), temperature source Oral, resp. rate 18, height 5' 0.5" (1.537 m), weight 66.7 kg, SpO2 90 %. Physical Exam Vitals and nursing note reviewed.  Constitutional:      General: She is not in acute distress.    Appearance: Normal appearance. She is not ill-appearing.     Comments: Sitting up in chair.  HENT:     Head: Normocephalic.     Right Ear: Tympanic membrane normal.     Ears:     Comments: HOH    Nose: Nose normal.  Eyes:     Pupils: Pupils are equal, round, and reactive to light.  Cardiovascular:     Rate and Rhythm: Normal rate and regular rhythm.     Heart sounds: No murmur heard.   Pulmonary:     Effort: Pulmonary effort is normal.     Breath sounds: Wheezing (expiratory ) and rhonchi present. No rales.  Abdominal:     General: Bowel sounds are normal. There is distension.     Tenderness: There is no abdominal tenderness.  Musculoskeletal:     Cervical back: Normal range of motion.  Skin:    General: Skin is warm.     Comments: Back incision dressed  Neurological:     Mental Status: She is alert.     Comments: Alert and oriented x 3. HOH. Normal insight and awareness. Intact Memory. Normal language and speech. Cranial nerve exam unremarkable. UE 5/5. LE: 2+/5 HF, 3- KE and 4/5 ADF/PF, with some  pain inhibition on left. ?decreased LT along lateral leg into foot. DTR's tr to 1+ LE's  Psychiatric:     Comments: Flat, cooperative     No results found for this or any previous visit (from the past 48 hour(s)).  No results found.   Medical Problem List and Plan: 1.  Functional and mobility deficits secondary to lumbar stenosis with neurogenic claudication, s/p re-do laminectomy with decompression of bilateral L3,L4,L5 nerve roots on 04/24/20.  -patient may shower  -ELOS/Goals: 8-10 days 2.  Antithrombotics: -DVT/anticoagulation:  Mechanical: Sequential compression devices, below knee Bilateral lower extremities  -antiplatelet therapy: N/A 3. Pain Management:  On oxycodone 10 mg prn,  Vicodin 10 mg every 4 hours prn, IV morphine 4 mg prn ( 18 mg in last 24 hours), ultram prn. Oxycontin 15 mg bid added on 10/25. Daughter reports intermittent confusion. Will d/c Vicodin and use oxycodone (was using PTA) as will last longer--regimen discussed in detail with daughter. .  4. Mood: LCSW to follow for evaluation and support.   -antipsychotic agents: N/A 5. Neuropsych: This patient is not fully capable of making decisions on his own behalf. 6. Skin/Wound Care: Monitor wound for healing  7. Fluids/Electrolytes/Nutrition: Monitor I/O. Check lytes in am.  8.  T2DM: On glucotrol 10 mg am/5 mg pm--monitor for hypoglycemic episodes with rise in SCr. Will Monitor BS ac/hs and use SSI for elevated BS.  9.  Acute on chronic anemia: Has dropped from 11.1-->6.9-->7.4. Continue iron supplement bid-- will monitor H/H with serial checks. Monitor for signs of bleeding.  10.  CKD stage III: Baseline SCr-1.43-->1.54 on follow up. Will  11.  Flaccid neurogenic bladder.  Patient with chronic Foley tube since 2017 back surgery.  DO NOT REMOVE FOLEY TUBE--gets foley change monthly at [facility]. 12.  Chronic BLE edema/Hypertension. Monitor BP tid--continue Lasix 40 mg daily.  13.  GERD: managed by  protonix 14.  Hyperlipidemia.  Continue Lipitor .15. Chronic constipation:  Will Miralax bid as colace ineffective.    -dulcolax suppository tonight  -pt states she has been eating, charted intake is fair       Bary Leriche, PA-C 05/02/2020

## 2020-05-02 NOTE — Progress Notes (Signed)
Inpatient Rehabilitation Medication Review by a Pharmacist  A complete drug regimen review was completed for this patient to identify any potential clinically significant medication issues.  Clinically significant medication issues were identified:  yes   Type of Medication Issue Identified Description of Issue Urgent (address now) Non-Urgent (address on AM team rounds) Plan Plan Accepted by Provider? (Yes / No / Pending AM Rounds)  Drug Interaction(s) (clinically significant)       Duplicate Therapy  Glipizide 5mg  XL daily and glipizide 10mg  daily ordered (given prior to transfer).  Per med hx, patient was not taking glipizide 5mg  XL daily. Urgent D/C glipizide 5mg  XL daily and send order to provider to co-sign Alert provider through chat msg  Allergy       No Medication Administration End Date       Incorrect Dose       Additional Drug Therapy Needed       Other  Home meds not yet resumed: ASA, Vit D, B12, prosight, KCL Non-urgent Resume at provider's discretion.     Name of provider notified for urgent issues identified: Dan  Provider Method of Notification:    For non-urgent medication issues to be resolved on team rounds tomorrow morning a [facility] Secure Chat Handoff was sent to: Linna Hoff   Time spent performing this drug regimen review (minutes):  15 min   [name], PharmD, BCPS, [place] 05/02/2020, 6:31 PM

## 2020-05-02 NOTE — Discharge Summary (Signed)
Physician Discharge Summary     Providing Compassionate, Quality Care - Together    Patient ID: [name] MRN: [id number] DOB/AGE: [birth year] 86 y.o.  Admit date: 04/24/2020 Discharge date: 05/02/2020  Admission Diagnoses: Spondylolisthesis of lumbar region  Discharge Diagnoses:  Active Problems:   Spondylolisthesis of lumbar region   Discharged Condition: good  Hospital Course: Patient underwent an L2-3, L3-4, L4-5 PLIF by Dr. Arnoldo Morale on 04/24/2020. She was admitted following recovery from anesthesia in the PACU. Her postoperative course has been complicated due to anemia and pain control. She has worked with both physical and occupational therapies who are recommending [facility]. She is ambulating with assistance. She is tolerating a normal diet. She is not having any bowel or bladder dysfunction. Her pain is reasonably controlled with oral pain medication. She is ready for discharge to CIR.   Consults: rehabilitation medicine  Significant Diagnostic Studies: DG Lumbar Spine 2-3 Views  Result Date: 04/24/2020 CLINICAL DATA:  L2-L5 PLIF EXAM: LUMBAR SPINE - 2-3 VIEW; DG C-ARM 1-60 MIN COMPARISON:  12/25/2019 FLUOROSCOPY TIME:  1 minutes 6 seconds Images: 2 FINDINGS: Five lumbar vertebra by prior radiographs. Images demonstrate BILATERAL pedicle screws at L2, L3, L4, and L5 with intervening disc prostheses. Diffuse osseous demineralization. Anterolisthesis at L4-L5 again seen. Linear density projecting over the operative field question sponge. IMPRESSION: Intraoperative images during posterior L2-L5 fusion. Electronically Signed   By: Lavonia Dana M.D.   On: 04/24/2020 15:51   DG Lumbar Spine 1 View  Result Date: 04/24/2020 CLINICAL DATA:  Localization for lumbar fusion EXAM: LUMBAR SPINE - 1 VIEW COMPARISON:  None. FINDINGS: Cross-table lateral view demonstrates localizer dorsal to the L2-L3 disc level. Subsequent image demonstrates  localizer is at L2-L3 and L3-L4 disc spaces. IMPRESSION: Intraoperative localization. Electronically Signed   By: Macy Mis M.D.   On: 04/24/2020 15:41   DG Lumbar Spine 1 View  Result Date: 04/24/2020 CLINICAL DATA:  Laminectomy with posterior lumbar interbody fusion L2 through L5. EXAM: LUMBAR SPINE - 1 VIEW COMPARISON:  Lumbar spine radiographs 12/25/2019 and 02/18/2020. Intraoperative radiograph earlier the same date. FINDINGS: Cross-table lateral view at 1045 hours labeled image 2. Prior studies demonstrate 5 lumbar type vertebral bodies. There is a grade 1 anterolisthesis with disc space narrowing at L3-4 and L4-5. Skin spreaders are present posteriorly with a surgical instrument directed over the L3 pedicles. IMPRESSION: Intraoperative localization as described. Electronically Signed   By: Richardean Sale M.D.   On: 04/24/2020 11:06   DG C-Arm 1-60 Min  Result Date: 04/24/2020 CLINICAL DATA:  L2-L5 PLIF EXAM: LUMBAR SPINE - 2-3 VIEW; DG C-ARM 1-60 MIN COMPARISON:  12/25/2019 FLUOROSCOPY TIME:  1 minutes 6 seconds Images: 2 FINDINGS: Five lumbar vertebra by prior radiographs. Images demonstrate BILATERAL pedicle screws at L2, L3, L4, and L5 with intervening disc prostheses. Diffuse osseous demineralization. Anterolisthesis at L4-L5 again seen. Linear density projecting over the operative field question sponge. IMPRESSION: Intraoperative images during posterior L2-L5 fusion. Electronically Signed   By: Lavonia Dana M.D.   On: 04/24/2020 15:51     Treatments: surgery: Bilateral redo L2-3, L3-4 and L4-5 laminotomy/laminectomy/foraminotomies/medial facetectomy to decompress the bilateral L3, L4 and L5 nerve roots(the work required to do this was in addition to the work required to do the posterior lumbar interbody fusion because of the patient's spinal stenosis, facet arthropathy. Etc. requiring a wide decompression of the nerve roots.);  L2-3, L3-4 and L4-5 transforaminal lumbar interbody fusion  with ProteiOS, local morselized  autograft bone and Zimmer DBM; insertion of interbody prosthesis at L2-3, L3-4 and L4-5 (globus peek expandable interbody prosthesis); posterior segmental instrumentation from L2 to L5 with globus titanium pedicle screws and rods; posterior lateral arthrodesis at L2-3, L3-4 and L4-5 with ProteiOS,  local morselized autograft bone and Zimmer DBM.  Discharge Exam: Blood pressure (!) 100/50, pulse 99, temperature 98.8 F (37.1 C), temperature source Oral, resp. rate 18, height 5' 0.5" (1.537 m), weight 66.7 kg, SpO2 90 %.  Alert and oriented x 4 PERRLA HOH MAE, Strength decreased secondary to patient's pain level, sensation intact Incision is covered with Honeycomb dressing and Steri Strips; Dressing is clean, dry, and intact    Disposition: Discharge disposition: [facility] Not Defined        Allergies as of 05/02/2020      Reactions   Phenergan [promethazine]    Slurred speech, acted very confused      Medication List    STOP taking these medications   traMADol 50 MG tablet Commonly known as: ULTRAM     TAKE these medications   acetaminophen 500 MG tablet Commonly known as: TYLENOL Take 500 mg by mouth every 6 (six) hours as needed for mild pain or moderate pain.   aspirin EC 81 MG tablet Take 81 mg by mouth daily at 12 noon.   atorvastatin 10 MG tablet Commonly known as: LIPITOR Take 1 tablet (10 mg total) by mouth daily.   bisacodyl 10 MG suppository Commonly known as: DULCOLAX Place 1 suppository (10 mg total) rectally daily as needed for moderate constipation.   cyanocobalamin 1000 MCG tablet Take 1,000 mcg by mouth at bedtime.   cyclobenzaprine 10 MG tablet Commonly known as: FLEXERIL Take 1 tablet (10 mg total) by mouth 3 (three) times daily as needed for muscle spasms.   docusate sodium 100 MG capsule Commonly known as: COLACE Take 1 capsule (100 mg total) by mouth 2 (two) times daily.    esomeprazole 40 MG capsule Commonly known as: NEXIUM TAKE 1 CAPSULE(40 MG) BY MOUTH DAILY What changed:   how much to take  how to take this  when to take this  additional instructions   ferrous sulfate 325 (65 FE) MG tablet Take 1 tablet (325 mg total) by mouth 2 (two) times daily with a meal.   furosemide 40 MG tablet Commonly known as: LASIX Take 1 tablet (40 mg total) by mouth daily. Or as directed   gabapentin 300 MG capsule Commonly known as: NEURONTIN Take 1 capsule (300 mg total) by mouth at bedtime.   glipiZIDE 5 MG 24 hr tablet Commonly known as: GLUCOTROL XL Take 1 tablet (5 mg total) by mouth every evening.   glipiZIDE 10 MG tablet Commonly known as: GLUCOTROL Take 1 tablet (10 mg total) by mouth in the morning.   ondansetron 4 MG disintegrating tablet Commonly known as: ZOFRAN-ODT Take 1 tablet (4 mg total) by mouth every 8 (eight) hours as needed for nausea or vomiting.   oxyCODONE 20 mg 12 hr tablet Commonly known as: OxyCONTIN Take 1 tablet (20 mg total) by mouth every 12 (twelve) hours.   oxyCODONE-acetaminophen 5-325 MG tablet Commonly known as: PERCOCET/ROXICET Take 1 tablet by mouth every 4 (four) hours as needed for moderate pain or severe pain.   polyethylene glycol 17 g packet Commonly known as: MIRALAX / GLYCOLAX Take 17 g by mouth daily as needed for moderate constipation or severe constipation.   Potassium 99 MG Tabs Take 1 tablet (  99 mg total) by mouth daily.   PRESERVISION AREDS PO Take 1 capsule by mouth in the morning and at bedtime.   Vitamin D 50 MCG (2000 UT) tablet Take 2,000 Units by mouth daily.       Follow-up Information    Newman Pies, MD. Schedule an appointment as soon as possible for a visit in 3 week(s).   Specialty: Neurosurgery Contact information: [address] [place] [address] [phone]               Signed: Patricia Nettle 05/02/2020, 11:50 AM

## 2020-05-02 NOTE — Progress Notes (Signed)
Patient is alert and oriented.  transferred to [place] with all belongings.

## 2020-05-02 NOTE — Discharge Instructions (Signed)
Wound Care Keep incision covered and dry for two days.    Do not put any creams, lotions, or ointments on incision. Leave steri-strips on back.  They will fall off by themselves.  Activity Walk each and every day, increasing distance each day. No lifting greater than 5 lbs. No driving for 2 weeks; may ride as a passenger locally.  Diet Resume your normal diet.   Return to Work Will be discussed at your follow up appointment.  Call Your Doctor If Any of These Occur Redness, drainage, or swelling at the wound.  Temperature greater than 101 degrees. Severe pain not relieved by pain medication. Incision starts to come apart.  Follow Up Appt Call today for appointment in 3 weeks ([phone]) or for problems.  If you have any hardware placed in your spine, you will need an x-ray before your appointment.  

## 2020-05-02 NOTE — Progress Notes (Signed)
Physical Therapy Treatment Patient Details Name: [name] MRN: [id number] DOB: 09-02-1929 Today's Date: 05/02/2020    History of Present Illness 86 yo female s/p bilateral redo L2-5 laminectomy/foraminotomy and medial facetectomy, L2-5 transforaminal lumbar interbody fusion on 10/18. PMH includes anemia, OA, breast cancer s/p radiation and R lumpectomy, CKD, DDD, UTIs, HLD, RBBB, DMII, R shoulder arthroplasty, bilat TKR.    PT Comments    Pt making good progress toward mobility goals, mostly limited due to reported severe back/LLE pain despite premedication before session. Pt reporting frustration with not having a bowel movement in >1 week, rehab MD Naaman Plummer) notified. Pt continues to require modA for bed mobility via log roll to transition to EOB and needs assist to don brace at EOB, but able to pull straps out to tighten brace. Pt requires increased time to initiate and perform mobility tasks and is very self-directed. Pt performed supine/seated BLE A/AAROM therapeutic exercises with good tolerance as detailed below. Pt progressed gait distance up to 59ft using RW and up to minA, pt needing assist at times to manage AD. Pt continues to benefit from PT services to progress toward functional mobility goals. Will continue to focus on progressing gait distance/standing tolerance in future sessions. D/C recs below remain appropriate at this time.   Follow Up Recommendations  CIR;Supervision/Assistance - 24 hour     Equipment Recommendations  None recommended by PT    Recommendations for Other Services       Precautions / Restrictions Precautions Precautions: Fall;Back Precaution Booklet Issued: Yes (comment) Precaution Comments: Requires cues throughout session for precautions Required Braces or Orthoses: Spinal Brace Spinal Brace: Lumbar corset;Applied in sitting position Restrictions Weight Bearing Restrictions: No    Mobility  Bed Mobility Overal bed mobility: Needs  Assistance Bed Mobility: Rolling;Sidelying to Sit Rolling: Min assist Sidelying to sit: Mod assist       General bed mobility comments: cues for best technique, assisted into hook lying before assist of rolling and side to sit.  Transfers Overall transfer level: Needs assistance Equipment used: Rolling walker (2 wheeled) Transfers: Sit to/from Stand Sit to Stand: Min assist;Mod assist         General transfer comment: increased time to initiate and perform mobility task 2/2 pain, cues for hand placement needed  Ambulation/Gait Ambulation/Gait assistance: Min assist Gait Distance (Feet): 30 Feet (30, 5ft with seated break) Assistive device: Rolling walker (2 wheeled) Gait Pattern/deviations: Step-to pattern;Step-through pattern;Decreased stance time - left;Decreased step length - right;Decreased step length - left;Decreased stride length Gait velocity: Decreased Gait velocity interpretation: <1.8 ft/sec, indicate of risk for recurrent falls General Gait Details: assist at times for stability, advancing RW or help turning RW.  Postural  and RW proximity cues.   Stairs             Wheelchair Mobility    Modified Rankin (Stroke Patients Only)       Balance Overall balance assessment: Needs assistance Sitting-balance support: No upper extremity supported;Single extremity supported Sitting balance-Leahy Scale: Fair Sitting balance - Comments: prefers 1 UE assist, but can sit unsupported to work on donning the brace, at times needs min guard Postural control: Posterior lean Standing balance support: Bilateral upper extremity supported Standing balance-Leahy Scale: Poor Standing balance comment: Reliant on RW for support, needs assist to manage AD                            Cognition Arousal/Alertness: Awake/alert Behavior During Therapy:  Flat affect Overall Cognitive Status: Difficult to assess                                 General  Comments: pt frequently closes eyes when seated/standing but can open eyes to command, states "I do this a lot."      Exercises General Exercises - Lower Extremity Ankle Circles/Pumps: AROM;Both;15 reps;Supine Short Arc Quad: AROM;Both;15 reps;Supine Long Arc Quad: AROM;Strengthening;Both;10 reps;Seated Heel Slides: AROM;Both;10 reps;Supine Hip ABduction/ADduction: AAROM;Both;15 reps;Supine    General Comments General comments (skin integrity, edema, etc.): pt daughter states that pt's son passed away 12 years ago today, do not ask about her children to avoid this difficult topic; pt dtr reports she can be there "as much as she needs to be."      Pertinent Vitals/Pain Pain Assessment: Faces Pain Score: 10-Worst pain ever Faces Pain Scale: Hurts worst Pain Location: LBP, LLE>RLE Pain Descriptors / Indicators: Sore;Discomfort;Operative site guarding;Grimacing;Constant Pain Intervention(s): Monitored during session;Premedicated before session;Repositioned    Home Living                      Prior Function            PT Goals (current goals can now be found in the care plan section) Acute Rehab PT Goals Patient Stated Goal: less pain PT Goal Formulation: With patient Time For Goal Achievement: 05/09/20 Potential to Achieve Goals: Good Progress towards PT goals: Progressing toward goals    Frequency    Min 5X/week      PT Plan Current plan remains appropriate    Co-evaluation              AM-PAC PT "6 Clicks" Mobility   Outcome Measure  Help needed turning from your back to your side while in a flat bed without using bedrails?: A Lot Help needed moving from lying on your back to sitting on the side of a flat bed without using bedrails?: A Lot Help needed moving to and from a bed to a chair (including a wheelchair)?: A Little Help needed standing up from a chair using your arms (e.g., wheelchair or bedside chair)?: A Little Help needed to walk in  hospital room?: A Little Help needed climbing 3-5 steps with a railing? : A Lot 6 Click Score: 15    End of Session Equipment Utilized During Treatment: Back brace Activity Tolerance: Patient limited by pain Patient left: in chair;with call bell/phone within reach;with nursing/sitter in room;with family/visitor present;with chair alarm set Nurse Communication: Mobility status PT Visit Diagnosis: Other abnormalities of gait and mobility (R26.89);Difficulty in walking, not elsewhere classified (R26.2)     Time: 3474-2595 PT Time Calculation (min) (ACUTE ONLY): 48 min  Charges:  $Gait Training: 8-22 mins $Therapeutic Exercise: 8-22 mins $Therapeutic Activity: 8-22 mins                     Tamecka Milham P., PTA Acute Rehabilitation Services Pager: [phone] Office: [place] 05/02/2020, 12:40 PM

## 2020-05-02 NOTE — Plan of Care (Signed)
  Problem: Pain Management: Goal: Pain level will decrease Outcome: Progressing   Problem: Skin Integrity: Goal: Will show signs of wound healing Outcome: Progressing   Problem: Skin Integrity: Goal: Will show signs of wound healing Outcome: Progressing   Problem: Safety: Goal: Ability to remain free from injury will improve Outcome: Progressing

## 2020-05-03 ENCOUNTER — Inpatient Hospital Stay (HOSPITAL_COMMUNITY): Payer: Medicare Other

## 2020-05-03 ENCOUNTER — Encounter (HOSPITAL_COMMUNITY): Payer: Self-pay | Admitting: Physical Medicine & Rehabilitation

## 2020-05-03 ENCOUNTER — Inpatient Hospital Stay (HOSPITAL_COMMUNITY): Payer: Medicare Other | Admitting: Occupational Therapy

## 2020-05-03 ENCOUNTER — Ambulatory Visit (HOSPITAL_COMMUNITY): Payer: Medicare Other | Attending: Physical Medicine and Rehabilitation

## 2020-05-03 DIAGNOSIS — I5043 Acute on chronic combined systolic (congestive) and diastolic (congestive) heart failure: Secondary | ICD-10-CM

## 2020-05-03 DIAGNOSIS — E8809 Other disorders of plasma-protein metabolism, not elsewhere classified: Secondary | ICD-10-CM

## 2020-05-03 DIAGNOSIS — R609 Edema, unspecified: Secondary | ICD-10-CM

## 2020-05-03 DIAGNOSIS — M7989 Other specified soft tissue disorders: Secondary | ICD-10-CM | POA: Diagnosis not present

## 2020-05-03 DIAGNOSIS — E46 Unspecified protein-calorie malnutrition: Secondary | ICD-10-CM

## 2020-05-03 DIAGNOSIS — E871 Hypo-osmolality and hyponatremia: Secondary | ICD-10-CM

## 2020-05-03 DIAGNOSIS — N1832 Chronic kidney disease, stage 3b: Secondary | ICD-10-CM

## 2020-05-03 DIAGNOSIS — E1165 Type 2 diabetes mellitus with hyperglycemia: Secondary | ICD-10-CM

## 2020-05-03 DIAGNOSIS — K5903 Drug induced constipation: Secondary | ICD-10-CM

## 2020-05-03 DIAGNOSIS — K5900 Constipation, unspecified: Secondary | ICD-10-CM

## 2020-05-03 DIAGNOSIS — G894 Chronic pain syndrome: Secondary | ICD-10-CM

## 2020-05-03 DIAGNOSIS — I10 Essential (primary) hypertension: Secondary | ICD-10-CM

## 2020-05-03 LAB — COMPREHENSIVE METABOLIC PANEL
ALT: 12 U/L (ref 0–44)
AST: 19 U/L (ref 15–41)
Albumin: 2.4 g/dL — ABNORMAL LOW (ref 3.5–5.0)
Alkaline Phosphatase: 90 U/L (ref 38–126)
Anion gap: 12 (ref 5–15)
BUN: 16 mg/dL (ref 8–23)
CO2: 27 mmol/L (ref 22–32)
Calcium: 8.8 mg/dL — ABNORMAL LOW (ref 8.9–10.3)
Chloride: 95 mmol/L — ABNORMAL LOW (ref 98–111)
Creatinine, Ser: 1.32 mg/dL — ABNORMAL HIGH (ref 0.44–1.00)
GFR, Estimated: 39 mL/min — ABNORMAL LOW (ref >60)
Glucose, Bld: 100 mg/dL — ABNORMAL HIGH (ref 70–99)
Potassium: 4 mmol/L (ref 3.5–5.1)
Sodium: 134 mmol/L — ABNORMAL LOW (ref 135–145)
Total Bilirubin: 0.8 mg/dL (ref 0.3–1.2)
Total Protein: 5.3 g/dL — ABNORMAL LOW (ref 6.5–8.1)

## 2020-05-03 LAB — CBC WITH DIFFERENTIAL/PLATELET
Abs Immature Granulocytes: 0.03 10*3/uL (ref 0.00–0.07)
Basophils Absolute: 0 10*3/uL (ref 0.0–0.1)
Basophils Relative: 0 %
Eosinophils Absolute: 0.3 10*3/uL (ref 0.0–0.5)
Eosinophils Relative: 5 %
HCT: 23.5 % — ABNORMAL LOW (ref 36.0–46.0)
Hemoglobin: 7.4 g/dL — ABNORMAL LOW (ref 12.0–15.0)
Immature Granulocytes: 1 %
Lymphocytes Relative: 27 %
Lymphs Abs: 1.6 10*3/uL (ref 0.7–4.0)
MCH: 28.9 pg (ref 26.0–34.0)
MCHC: 31.5 g/dL (ref 30.0–36.0)
MCV: 91.8 fL (ref 80.0–100.0)
Monocytes Absolute: 0.6 10*3/uL (ref 0.1–1.0)
Monocytes Relative: 9 %
Neutro Abs: 3.6 10*3/uL (ref 1.7–7.7)
Neutrophils Relative %: 58 %
Platelets: 453 10*3/uL — ABNORMAL HIGH (ref 150–400)
RBC: 2.56 MIL/uL — ABNORMAL LOW (ref 3.87–5.11)
RDW: 14.6 % (ref 11.5–15.5)
WBC: 6.2 10*3/uL (ref 4.0–10.5)
nRBC: 0 % (ref 0.0–0.2)

## 2020-05-03 LAB — GLUCOSE, CAPILLARY
Glucose-Capillary: 116 mg/dL — ABNORMAL HIGH (ref 70–99)
Glucose-Capillary: 95 mg/dL (ref 70–99)
Glucose-Capillary: 114 mg/dL — ABNORMAL HIGH (ref 70–99)

## 2020-05-03 MED ORDER — CHLORHEXIDINE GLUCONATE CLOTH 2 % EX PADS
6.0000 | MEDICATED_PAD | Freq: Every day | CUTANEOUS | Status: DC
  Administered 2020-05-03 – 2020-05-13 (×11): 6 via TOPICAL

## 2020-05-03 MED ORDER — LIDOCAINE 5 % EX PTCH
1.0000 | MEDICATED_PATCH | CUTANEOUS | Status: DC
  Administered 2020-05-03 – 2020-05-16 (×14): 1 via TRANSDERMAL
  Filled 2020-05-03 (×14): qty 1

## 2020-05-03 NOTE — Progress Notes (Addendum)
[facility] PHYSICAL MEDICINE & REHABILITATION PROGRESS NOTE  Subjective/Complaints: Patient seen sitting up in bed this morning.  She states she slept well overnight.  She states she hurts all over.  She notes she is constipated.  ROS: + Generalized pain.  Denies CP, SOB, N/V/D  Objective: Vital Signs: Blood pressure 114/60, pulse (!) 110, temperature 99 F (37.2 C), resp. rate 16, height 5\' 2"  (1.575 m), weight 70.4 kg, SpO2 93 %. DG Abd 1 View  Result Date: 05/02/2020 CLINICAL DATA:  Persistent constipation with vomiting EXAM: ABDOMEN - 1 VIEW COMPARISON:  None. FINDINGS: Surgical hardware in the lumbar spine. Nonobstructed gas pattern with moderate stool in the colon. No radiopaque calculi. Advanced arthritis of the left hip. IMPRESSION: Nonobstructed gas pattern with moderate to large amount of stool in the colon. Electronically Signed   By: Donavan Foil M.D.   On: 05/02/2020 20:16   Recent Labs    05/03/20 0609  WBC 6.2  HGB 7.4*  HCT 23.5*  PLT 453*   Recent Labs    05/03/20 0609  NA 134*  K 4.0  CL 95*  CO2 27  GLUCOSE 100*  BUN 16  CREATININE 1.32*  CALCIUM 8.8*    Intake/Output Summary (Last 24 hours) at 05/03/2020 1018 Last data filed at 05/03/2020 0507 Gross per 24 hour  Intake --  Output 1400 ml  Net -1400 ml        Physical Exam: BP 114/60 (BP Location: Left Arm)   Pulse (!) 110   Temp 99 F (37.2 C)   Resp 16   Ht 5\' 2"  (1.575 m)   Wt 70.4 kg   SpO2 93%   BMI 28.39 kg/m  Constitutional: No distress . Vital signs reviewed. HENT: Normocephalic.  Atraumatic. Eyes: EOMI. No discharge. Cardiovascular: No JVD.  RRR. Respiratory: Normal effort.  No stridor.  Scattered ronchi.  GI: Non-distended.  BS +. Skin: Warm and dry.  Back incision with dressing CDI. Psych: Normal mood.  Normal behavior. Musc: Generalized tenderness. No edema.  Neuro:  Alert and oriented. HOH Motor: B/l UE 5/5.  B/l LE: 2+/5 HF, 3- KE and 4/5 ADF/PF, with some pain  inhibition  Assessment/Plan: 1. Functional deficits secondary to lumbar stenosis with neurogenic claudication s/p redo laminectomy with decompression b/l L3-5 which require 3+ hours per day of interdisciplinary therapy in a comprehensive inpatient rehab setting.  Physiatrist is providing close team supervision and 24 hour management of active medical problems listed below.  Physiatrist and rehab team continue to assess barriers to discharge/monitor patient progress toward functional and medical goals   Care Tool:  Bathing              Bathing assist       Upper Body Dressing/Undressing Upper body dressing   What is the patient wearing?: Hospital gown only    Upper body assist Assist Level: Maximal Assistance - Patient 25 - 49%    Lower Body Dressing/Undressing Lower body dressing      What is the patient wearing?: Hospital gown only     Lower body assist Assist for lower body dressing: Maximal Assistance - Patient 25 - 49%     Toileting Toileting    Toileting assist Assist for toileting: Maximal Assistance - Patient 25 - 49%     Transfers Chair/bed transfer  Transfers assist           Locomotion Ambulation   Ambulation assist  Walk 10 feet activity   Assist           Walk 50 feet activity   Assist           Walk 150 feet activity   Assist           Walk 10 feet on uneven surface  activity   Assist           Wheelchair     Assist               Wheelchair 50 feet with 2 turns activity    Assist            Wheelchair 150 feet activity     Assist           Medical Problem List and Plan: 1.Functional and mobility deficitssecondary to lumbar stenosis with neurogenic claudication, s/p re-do laminectomy with decompression of bilateral L3,L4,L5 nerve roots on 04/24/20.  Begin CIR evaluations  Team conference today to discuss current and goals and coordination of care,  home and environmental barriers, and discharge planning with nursing, case manager, and therapies. Please see conference note from today as well.  2. Antithrombotics: -DVT/anticoagulation:Mechanical:Sequential compression devices, below kneeBilateral lower extremities -antiplatelet therapy: N/A 3. Pain Management:  Cont Gabapentin 300mg  daily qhs  Continue Oxycontin mg  15 BID  Cont tramadol 50 TID  Cont Percocet 5 q4 PRN  Lidocaine patch ordered  D/cedVicodin per report of confusion  Monitor with increased exertion  4. Mood:LCSW to follow for evaluation and support. -antipsychotic agents: N/A 5. Neuropsych: This patientis not fullycapable of making decisions on hisown behalf. 6. Skin/Wound Care:Monitor wound for healing 7. Fluids/Electrolytes/Nutrition:Monitor I/Os. 8. T2DM with hyperglycemia: On glucotrol 10 mg am/5 mg pm--monitor for hypoglycemic episodes with rise in SCr. Will Monitor BS ac/hs and use SSI for elevated BS.  Monitor with increased exertion 9.Acute on chronic anemia:   Continue iron supplement bid  Monitor for signs of bleeding.   Hb 7.4 on 10/27  Cont to monitor 10. CKD stage III: Baseline SCr-1.43-->1.54  Cr. 1.32 on 10/27 11. Flaccid neurogenic bladder. Patient with chronic Foley tubesince 2017 back surgery.DO NOT REMOVE FOLEY TUBE--gets foley change monthly at [facility]. 12.Chronic BLE edema/Hypertension.Monitor BP tid--continue Lasix 40 mg daily.   Monitor with increased mobility 13. GERD: managed by protonix 14. Hyperlipidemia.Continue Lipitor 15. Chronic constipation: Added Miralax bid as colace ineffective.  -dulcolax suppository in AM KUB with constipation  Will consider further increase if necessary 16. Hyponatremia  Na 134 on 10/27, cont to monitor 17. Hypoalbuminemia  Supplement initiated on 10/27  LOS: 1 days A FACE TO FACE EVALUATION WAS PERFORMED  Viviano Bir  Lorie Phenix 05/03/2020, 10:18 AM

## 2020-05-03 NOTE — Progress Notes (Signed)
Patient information reviewed and entered into [facility] by Becky Shaniya Tashiro, PPS coordinator. Information including medical coding, function ability, and quality indicators will be reviewed and updated through discharge.   

## 2020-05-03 NOTE — Evaluation (Signed)
Physical Therapy Assessment and Plan  Patient Details  Name: [name] MRN: [id number] Date of Birth: 1930/02/06  PT Diagnosis: Abnormal posture, Abnormality of gait, Difficulty walking, Low back pain and Muscle weakness Rehab Potential: Good ELOS: 2 weeks   Today's Date: 05/03/2020 PT Individual Time: 4627-0350 PT Individual Time Calculation (min): 72 min    Hospital Problem: Principal Problem:   Lumbar radiculopathy Active Problems:   Constipation   Hyponatremia   Hypoalbuminemia due to protein-calorie malnutrition (HCC)   Benign essential HTN   Stage 3b chronic kidney disease ([place])   Acute on chronic combined systolic and diastolic heart failure (HCC)   Chronic pain syndrome   Controlled type 2 diabetes mellitus with hyperglycemia, without long-term current use of insulin (HCC)   Past Medical History:  Past Medical History:  Diagnosis Date  . Anemia   . Arthritis   . Bilateral edema of lower extremity   . 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 Chronic lower back pain   . CKD (chronic kidney disease) stage 3, GFR 30-59 ml/min (HCC)   . DDD (degenerative disc disease)    lumbar  . Diverticulosis   . Family history of breast cancer   . Family history of pancreatic cancer   . Family history of prostate cancer   . Flaccid neuropathic bladder, not elsewhere classified   . Foley catheter in place   . GERD (gastroesophageal reflux disease)   . Hemorrhoids   . Hiatal hernia   . History of colon polyps   . History of radiation therapy 09-12-2015 to 10-12-2015   42.72 gray in 16 fractions directed right breast w/ boost of 12 gray in 6 fractions directed at the lumpectomy cavity- Total dose: 54.72y  . History of recurrent UTIs   . Hyperlipidemia   . PONV  (postoperative nausea and vomiting)   . RBBB (right bundle branch block with left anterior fascicular block)   . Type 2 diabetes mellitus ([place])   . Urinary retention with incomplete bladder emptying   . Wears dentures    full upper and lower partial  . Wears glasses   . Wears hearing aid    bilateral but does not wear   Past Surgical History:  Past Surgical History:  Procedure Laterality Date  . BACK SURGERY    . BREAST LUMPECTOMY WITH RADIOACTIVE SEED LOCALIZATION Right 08/03/2015   Procedure: BREAST LUMPECTOMY WITH RADIOACTIVE SEED LOCALIZATION;  Surgeon: [name], MD;  Location: [place];  Service: General;  Laterality: Right;  . BREAST SURGERY    . CARDIOVASCULAR STRESS TEST  01/14/2012   Low risk nuclear study w/ small mild apical and apical lateral reversible perfusion defect represents a small area of ischemia versus shifting breast artifact/  normal LV funciton and wall motion, ef 86%  . CARPAL TUNNEL RELEASE Right 1977  . CATARACT EXTRACTION W/ INTRAOCULAR LENS  IMPLANT, BILATERAL Bilateral left 1996/  right 1997  . CLOSED RIGHT KNEE MANIPULATION  08/11/2002   post TKA  . CYSTOSTOMY N/A 05/17/2016   Procedure: CYSTOSCOPY WITH  SUPRAPUBIC PLACMENT;  Surgeon: [name], MD;  Location[facility];  Service: Urology;  Laterality: N/A;  . EYE SURGERY    . GANGLION CYST EXCISION Right 12/09/2001   right palm  .  HERNIA REPAIR    . JOINT REPLACEMENT    . KNEE ARTHROSCOPY Right 12/14/2002   w/ Lysis Adhesions  . LAMINECTOMY  04/24/2020   Bilateral redo L2-3, L3-4 and L4-5 laminotomy/laminectomy/foraminotomies/medial facetectomy to decompress the bilateral L3, L4 and L5 nerve roots  . LUMBAR LAMINECTOMY/DECOMPRESSION MICRODISCECTOMY N/A 03/06/2016   Procedure: CENTRAL DECOMPRESSION L3 - L4 ,L4 - L5;  Surgeon: [name], MD;  Location: [facility];  Service: Orthopedics;  Laterality: N/A;  . SHOULDER HEMI-ARTHROPLASTY Right 02/20/2009    avascular necrosis   . TOTAL KNEE ARTHROPLASTY Bilateral right 06-23-2002/  left 07-11-2008  . [place]  . VAGINAL HYSTERECTOMY  1975    Assessment & Plan Clinical Impression: Patient is a 86 y.o. year old female with history of chronic anemia, breast cancer of upper outer quadrant of right breast 2017 status post lumpectomy with radiation therapy followed by oncology services, CKD stage III with creatinine baseline 1.43, type 2 diabetes mellitus, hyperlipidemia, right shoulder arthroplasty,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 History of chronic back pain with decompression microdiscectomy 2017. Presented 04/24/2020 with complaints of increasing back pain radiating to the lower extremities. MRI and imaging lumbar showed multilevel lumbar spinal listhesis and spinal stenosis. Patient underwent bilateral redo of L2--3, 3-4 and L4-5 laminotomy/laminectomy/foraminotomies to decompress the bilateral L3-4 and L5 nerve roots as well as transforaminal lumbar interbody fusion 04/24/2020 per Dr. Arnoldo Morale. Back brace when out of bed. Hospital course anemia 6.9-7.5 as well as bouts of hypotension.Bouts of hypoglycemia 04/28/2020, down to 35, and received diabetic gel. Therapy evaluations completed and patient was recommended for a comprehensive rehab program.  Patient currently requires mod with mobility secondary to muscle weakness, decreased cardiorespiratoy endurance and decreased sitting balance, decreased standing balance, decreased postural control and decreased balance strategies.  Prior to hospitalization, patient was modified independent  with mobility and lived with Spouse in a House home.  Home access is  Ramped entrance.  Patient will benefit from skilled PT intervention to maximize safe functional mobility, minimize fall risk and decrease caregiver burden for planned discharge home with intermittent assist.  Anticipate patient will benefit from follow up [facility] at  discharge.  PT - End of Session Activity Tolerance: Tolerates 30+ min activity with multiple rests Endurance Deficit: Yes Endurance Deficit Description: requires frequent rest breaks, pain greatly limiting mobility/tolerance PT Assessment Rehab Potential (ACUTE/IP ONLY): Good PT Barriers to Discharge: Decreased caregiver support PT Barriers to Discharge Comments: daughter only able to provide intermittent assist; husband in poor health PT Patient demonstrates impairments in the following area(s): Balance;Endurance;Motor;Pain PT Transfers Functional Problem(s): Bed Mobility;Bed to Chair;Car;Furniture PT Locomotion Functional Problem(s): Ambulation;Wheelchair Mobility;Stairs PT Plan PT Intensity: Minimum of 1-2 x/day ,45 to 90 minutes PT Frequency: 5 out of 7 days PT Duration Estimated Length of Stay: 2 weeks PT Treatment/Interventions: Ambulation/gait training;Discharge planning;Functional mobility training;Therapeutic Activities;Balance/vestibular training;Disease management/prevention;Neuromuscular re-education;Therapeutic Exercise;Wheelchair propulsion/positioning;DME/adaptive equipment instruction;Pain management;Splinting/orthotics;UE/LE Strength taining/ROM;Community reintegration;Functional electrical stimulation;Patient/family education;Stair training;UE/LE Coordination activities PT Transfers Anticipated Outcome(s): supervision with LRAD PT Locomotion Anticipated Outcome(s): supervision with LRAD PT Recommendation Follow Up Recommendations: Home health PT Patient destination: Home Equipment Recommended: To be determined Equipment Details: pt has RW   PT Evaluation Precautions/Restrictions Precautions Precautions: Fall;Back Required Braces or Orthoses: Spinal Brace Spinal Brace: Lumbar corset;Applied in sitting position Restrictions Weight Bearing Restrictions: No Home Living/Prior Functioning Home Living Living Arrangements: Spouse/significant other Available Help at  Discharge: Family;Available PRN/intermittently (daughter has been helping in the afternoons) Type of Home: House Home Access: Ramped entrance Home Layout:  One level Bathroom Shower/Tub: Holiday representative Accessibility: Yes  Lives With: Spouse Prior Function Level of Independence: Independent with basic ADLs;Requires assistive device for independence;Needs assistance with homemaking  Able to Take Stairs?: No Driving: No 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[employer], hospital Overall Cognitive Status: Within Advertising copywriter for tasks assessed Arousal/Alertness: Awake/alert (keeps eyes closed majority of session) Orientation Level: Oriented X4 Memory: Impaired Awareness: Impaired Problem Solving: Impaired Safety/Judgment: Appears intact Sensation Sensation Light Touch: Appears Intact Proprioception: Appears Intact Coordination Gross Motor Movements are Fluid and Coordinated: No Fine Motor Movements are Fluid and Coordinated: Yes Coordination and Movement Description: grossly uncoordinated due to pain, generalized weakness, decreased balance/postural control Finger Nose Finger Test: [facility] bilaterally Heel Shin Test: decreased ROM bilaterally Motor  Motor Motor: Abnormal postural alignment and control Motor - Skilled Clinical Observations: grossly uncoordinated due to pain, generalized weakness, decreased balance/postural control  Trunk/Postural Assessment  Cervical Assessment Cervical Assessment: Exceptions to [facility] (forward head) Thoracic Assessment Thoracic Assessment: Exceptions to [facility] (mild kyphosis) Lumbar Assessment Lumbar Assessment: Exceptions to [facility] (posterior pelvic tilt) Postural Control Postural Control: Deficits on evaluation  Balance Balance Balance Assessed: Yes Static Sitting Balance Static Sitting - Balance Support: Feet supported;Bilateral upper extremity supported Static Sitting - Level of Assistance: 5: Stand by assistance  (supervision) Dynamic Sitting Balance Dynamic Sitting - Balance Support: Feet supported;Bilateral upper extremity supported Dynamic Sitting - Level of Assistance: 5: Stand by assistance (CGA) Static Standing Balance Static Standing - Balance Support: Bilateral upper extremity supported (RW) Static Standing - Level of Assistance: 5: Stand by assistance (CGA) Dynamic Standing Balance Dynamic Standing - Balance Support: Bilateral upper extremity supported (RW) Dynamic Standing - Level of Assistance: 3: Mod assist Extremity Assessment  RLE Assessment RLE Assessment: Exceptions to [facility] General Strength Comments: grossly generalized 3/5 LLE Assessment LLE Assessment: Exceptions to [facility] General Strength Comments: grossly generalized to 3/5 (except knee flexion/extension and DF 3-/5)  Care Tool Care Tool Bed Mobility Roll left and right activity   Roll left and right assist level: Moderate Assistance - Patient 50 - 74%    Sit to lying activity   Sit to lying assist level: Maximal Assistance - Patient 25 - 49%    Lying to sitting edge of bed activity   Lying to sitting edge of bed assist level: Moderate Assistance - Patient 50 - 74%     Care Tool Transfers Sit to stand transfer   Sit to stand assist level: Moderate Assistance - Patient 50 - 74%    Chair/bed transfer   Chair/bed transfer assist level: Moderate Assistance - Patient 50 - 74%     Toilet transfer   Assist Level: Moderate Assistance - Patient 50 - 74%    Car transfer   Car transfer assist level: Maximal Assistance - Patient 25 - 49%      Care Tool Locomotion Ambulation   Assist level: Minimal Assistance - Patient > 75% Assistive device: Walker-rolling Max distance: 61f  Walk 10 feet activity   Assist level: Minimal Assistance - Patient > 75% Assistive device: Walker-rolling   Walk 50 feet with 2 turns activity Walk 50 feet with 2 turns activity did not occur: Safety/medical concerns (fatigue, pain, generalized  weakness)      Walk 150 feet activity Walk 150 feet activity did not occur: Safety/medical concerns (fatigue, pain, generalized weakness)      Walk 10 feet on uneven surfaces activity Walk 10 feet on uneven surfaces activity did not occur: Safety/medical concerns (fatigue, pain, generalized  weakness)      Stairs Stair activity did not occur: Safety/medical concerns (fatigue, pain, generalized weakness)        Walk up/down 1 step activity Walk up/down 1 step or curb (drop down) activity did not occur: Safety/medical concerns (fatigue, pain, generalized weakness)     Walk up/down 4 steps activity did not occuR: Safety/medical concerns (fatigue, pain, generalized weakness)  Walk up/down 4 steps activity      Walk up/down 12 steps activity Walk up/down 12 steps activity did not occur: Safety/medical concerns (fatigue, pain, generalized weakness)      Pick up small objects from floor Pick up small object from the floor (from standing position) activity did not occur: Safety/medical concerns (fatigue, pain, generalized weakness)      Wheelchair Will patient use wheelchair at discharge?: Yes Type of Wheelchair: Manual   Wheelchair assist level: Supervision/Verbal cueing Max wheelchair distance: 46f  Wheel 50 feet with 2 turns activity   Assist Level: Moderate Assistance - Patient 50 - 74%  Wheel 150 feet activity   Assist Level: Total Assistance - Patient < 25%    Refer to Care Plan for Long Term Goals  SHORT TERM GOAL WEEK 1 PT Short Term Goal 1 (Week 1): pt will perform bed mobility with min A consistantly PT Short Term Goal 2 (Week 1): pt will transfer bed<>WC with LRAD min A PT Short Term Goal 3 (Week 1): Pt will ambulate 259fwith LRAD CGA  Recommendations for other services: None   Skilled Therapeutic Intervention Evaluation completed (see details above and below) with education on PT POC and goals and individual treatment initiated with focus on functional  mobility/transfers, generalized strengthening, dynamic standing balance/coordinaiton, ambulation, and improved activity tolerance. Received pt sitting in recliner with daughter present at bedside, pt educated on PT evaluation, CIR policies, and therapy schedule and agreeable. Pt reported feeling nauseous and rated pain 10/10 "everywhere" but pointing to low back. RN notified and administered pain medication during session. Pt frequently closing eyes during session. Pt transferred sit<>stand with RW and mod A and ambulated 1159fith RW and min A to WC. Pt demonstrated narrow BOS, decreased stride length, decreased trunk rotation, and decreased bilateral foot clearance. Pt reported fatigue after ambulating. Pt performed WC mobility [id number] bilateral UEs and supervision with significantly increased time. Pt required frequent rest breaks and encouragement to continue. Pt transported remainder of way to ortho gym [facility]. Pt performed simulated car transfer with RW and max A for LE management. [facility] and [facility] WC<>bed stand<>pivot with RW and mod A. While sitting EOB pt with multiple episodes of emesis (RN made aware). Pt transferred sit<>stand mod A and doffed pants and LSO total A. Pt transferred sit<>supine with max A and scooted to [facility] max A and use of Trendelenburg bed position. Concluded session with pt supine in bed, needs within reach, and bed alarm on.   Mobility Bed Mobility Bed Mobility: Rolling Left;Sit to Supine Rolling Right: Moderate Assistance - Patient 50-74% Rolling Left: Moderate Assistance - Patient 50-74% Right Sidelying to Sit: Moderate Assistance - Patient 50-74% Sit to Supine: Maximal Assistance - Patient 25-49% Transfers Transfers: Sit to Stand;Stand to Sit;Stand Pivot Transfers Sit to Stand: Moderate Assistance - Patient 50-74% Stand to Sit: Minimal Assistance - Patient > 75% Stand Pivot Transfers: Moderate Assistance - Patient  50 - 74% Stand Pivot Transfer Details: Verbal cues for sequencing;Verbal cues for technique;Verbal cues for safe  use of DME/AE Stand Pivot Transfer Details (indicate cue type and reason): verbal cues for transfer technique Transfer (Assistive device): Rolling walker Locomotion  Gait Ambulation: Yes Gait Assistance: Minimal Assistance - Patient > 75% Gait Distance (Feet): 11 Feet Assistive device: Rolling walker Gait Assistance Details: Verbal cues for technique Gait Assistance Details: verbal cues for technique Gait Gait: Yes Gait Pattern: Impaired Gait Pattern: Step-to pattern;Decreased trunk rotation;Decreased stride length;Decreased step length - right;Decreased step length - left;Poor foot clearance - left;Poor foot clearance - right;Narrow base of support Gait velocity: Decreased Wheelchair Mobility Wheelchair Mobility: Yes Wheelchair Assistance: Chartered loss adjuster: Both upper extremities Wheelchair Parts Management: Needs assistance Distance: 67f   Discharge Criteria: Patient will be discharged from PT if patient refuses treatment 3 consecutive times without medical reason, if treatment goals not met, if there is a change in medical status, if patient makes no progress towards goals or if patient is discharged from hospital.  The above assessment, treatment plan, treatment alternatives and goals were discussed and mutually agreed upon: by patient  AAlfonse AlpersPT, DPT  05/03/2020, 3:31 PM

## 2020-05-03 NOTE — Progress Notes (Signed)
Occupational Therapy Session Note  Patient Details  Name: [name] MRN: [id number] Date of Birth: January 19, 1930  Today's Date: 05/03/2020 OT Individual Time: 1120-1210 OT Individual Time Calculation (min): 50 min    Short Term Goals: Week 1:  OT Short Term Goal 1 (Week 1): Pt will don pants with min assist with AE as needed OT Short Term Goal 2 (Week 1): Pt will complete sit > stand as needed for toileting and LB dressing with min assist OT Short Term Goal 3 (Week 1): Pt will complete bathing with min assist with AE as needed OT Short Term Goal 4 (Week 1): Pt will complete toilet transfer with min assist, ambulating with RW  Skilled Therapeutic Interventions/Progress Updates:  Balance/vestibular training;Discharge planning;Disease mangement/prevention;Pain management;Neuromuscular re-education;Functional mobility training;DME/adaptive equipment instruction;Patient/family education;Psychosocial support;Self Care/advanced ADL retraining;Splinting/orthotics;UE/LE Strength taining/ROM;Therapeutic Activities;Therapeutic Exercise;Wheelchair propulsion/positioning   1:1 Pt received in w/c. Engaged in dressing sit to stand. Pt required A to pull down shirt and don orthosis. Instructions on using a reacher for threading LB clothing. Pt reports not recalling using a reacher before. Pt still required total A for threading pants (especially through cathter- reported she had one at home). TEDS and shoes donned with total A. Pt able to perform sit to stand from w/c with extra time (to process commands) with min A and then ambulate with RW with min A from sink side of bed to recliner next to window. Pt did demonstrate more weakness in left Le with weight shifts. Pt became incontinent of bowel when sitting down in recliner. Ambulated with RW to the bathroom at very slow rate. Pt required total A for toileting. Stand pivot from toilet to recliner brought to her in the bathroom due to fatigue and LEs "feeling  more wobbly." Left resting in the recliner with LEs elevated. Daughter present and discussed goals, purpose and role of OT in her stay on CIR.   Therapy Documentation Precautions:  Precautions Precautions: Fall, Back Required Braces or Orthoses: Spinal Brace Spinal Brace: Lumbar corset, Applied in sitting position Restrictions Weight Bearing Restrictions: No Pain:  ongoing c/o pain in back- had just received meds; encouraged moving around the room and changing type of chair sitting in  Therapy/Group: Individual Therapy  Willeen Cass [facility] 05/03/2020, 3:08 PM

## 2020-05-03 NOTE — Evaluation (Signed)
Occupational Therapy Assessment and Plan  Patient Details  Name: [name] MRN: [id number] Date of Birth: 01-01-1930  OT Diagnosis: acute pain, lumbago (low back pain), muscle weakness (generalized) and pain in joint Rehab Potential: Rehab Potential (ACUTE ONLY): Good ELOS: 10-14 days   Today's Date: 05/03/2020 OT Individual Time: 0850-1003 OT Individual Time Calculation (min): 73 min     Hospital Problem: Principal Problem:   Lumbar radiculopathy Active Problems:   Constipation   Hyponatremia   Hypoalbuminemia due to protein-calorie malnutrition (HCC)   Benign essential HTN   Stage 3b chronic kidney disease (HCC)   Acute on chronic combined systolic and diastolic heart failure (HCC)   Chronic pain syndrome   Controlled type 2 diabetes mellitus with hyperglycemia, without long-term current use of insulin ([place])   Past Medical History:  Past Medical History:  Diagnosis Date  . Anemia   . Arthritis   . Bilateral edema of lower extremity   . 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 Chronic lower back pain   . CKD (chronic kidney disease) stage 3, GFR 30-59 ml/min (HCC)   . DDD (degenerative disc disease)    lumbar  . Diverticulosis   . Family history of breast cancer   . Family history of pancreatic cancer   . Family history of prostate cancer   . Flaccid neuropathic bladder, not elsewhere classified   . Foley catheter in place   . GERD (gastroesophageal reflux disease)   . Hemorrhoids   . Hiatal hernia   . History of colon polyps   . History of radiation therapy 09-12-2015 to 10-12-2015   42.72 gray in 16 fractions directed right breast w/ boost of 12 gray in 6 fractions directed at the lumpectomy cavity- Total dose: 54.72y  . History of recurrent UTIs   .  Hyperlipidemia   . PONV (postoperative nausea and vomiting)   . RBBB (right bundle branch block with left anterior fascicular block)   . Type 2 diabetes mellitus ([place])   . Urinary retention with incomplete bladder emptying   . Wears dentures    full upper and lower partial  . Wears glasses   . Wears hearing aid    bilateral but does not wear   Past Surgical History:  Past Surgical History:  Procedure Laterality Date  . BACK SURGERY    . BREAST LUMPECTOMY WITH RADIOACTIVE SEED LOCALIZATION Right 08/03/2015   Procedure: BREAST LUMPECTOMY WITH RADIOACTIVE SEED LOCALIZATION;  Surgeon: [name], MD;  Location: [facility];  Service: General;  Laterality: Right;  . BREAST SURGERY    . CARDIOVASCULAR STRESS TEST  01/14/2012   Low risk nuclear study w/ small mild apical and apical lateral reversible perfusion defect represents a small area of ischemia versus shifting breast artifact/  normal LV funciton and wall motion, ef 86%  . CARPAL TUNNEL RELEASE Right 1977  . CATARACT EXTRACTION W/ INTRAOCULAR LENS  IMPLANT, BILATERAL Bilateral left 1996/  right 1997  . CLOSED RIGHT KNEE MANIPULATION  08/11/2002   post TKA  . CYSTOSTOMY N/A 05/17/2016   Procedure: CYSTOSCOPY WITH  SUPRAPUBIC PLACMENT;  Surgeon: [name], MD;  Location: [facility];  Service: Urology;  Laterality: N/A;  . EYE SURGERY    . GANGLION CYST EXCISION Right 12/09/2001  right palm  . HERNIA REPAIR    . JOINT REPLACEMENT    . KNEE ARTHROSCOPY Right 12/14/2002   w/ Lysis Adhesions  . LAMINECTOMY  04/24/2020   Bilateral redo L2-3, L3-4 and L4-5 laminotomy/laminectomy/foraminotomies/medial facetectomy to decompress the bilateral L3, L4 and L5 nerve roots  . LUMBAR LAMINECTOMY/DECOMPRESSION MICRODISCECTOMY N/A 03/06/2016   Procedure: CENTRAL DECOMPRESSION L3 - L4 ,L4 - L5;  Surgeon: [name], MD;  Location: [facility];  Service: Orthopedics;  Laterality: N/A;  . SHOULDER  HEMI-ARTHROPLASTY Right 02/20/2009   avascular necrosis   . TOTAL KNEE ARTHROPLASTY Bilateral right 06-23-2002/  left 07-11-2008  . [place]  . VAGINAL HYSTERECTOMY  1975    Assessment & Plan Clinical Impression: Patient is a 86 y.o. 86 year old female with history of T2DM, neurogenic bladder, OA, CKD, DDD with back pain and LE pain with neurogenic claudication due to L2-L5 stenosis with spondylolisthesis and was admitted on 04/24/20 for redo L2/3, L3/4 and L4/5 laminotomy, laminectomy with decompression of L3, L4 and L5 nerve roots by Dr. Arnoldo Morale. Post op course significant for issues with generalized weakness with hypotension, somnolence, acute on chronic renal failure with ABLA. Narcotics being titrated upwards for better pain control. She continues to have limitations due to ongoing pain radiating LLE, poor standing balance as well as difficulty recalling precautions.  Has been limiteddue to back pain since June admission with limited mobility. Sponge bathes. Neighbors/Daughter has been assisting with meals/home since then.CIR recommended due to functional deficits.  Patient transferred to CIR on 05/02/2020 .    Patient currently requires mod-max with basic self-care skills secondary to muscle weakness, decreased coordination and decreased standing balance, decreased balance strategies and difficulty maintaining precautions.  Prior to hospitalization, patient could complete ADLs with supervision.  Patient will benefit from skilled intervention to decrease level of assist with basic self-care skills and increase independence with basic self-care skills prior to discharge home with care partner.  Anticipate patient will require intermittent supervision and follow up home health.  OT - End of Session Activity Tolerance: Tolerates 30+ min activity with multiple rests Endurance Deficit: Yes Endurance Deficit Description: requires frequent rest breaks, pain greatly limiting  mobility/tolerance OT Assessment Rehab Potential (ACUTE ONLY): Good OT Barriers to Discharge: Decreased caregiver support;Incontinence OT Patient demonstrates impairments in the following area(s): Balance;Endurance;Motor;Pain;Safety OT Basic ADL's Functional Problem(s): Grooming;Bathing;Dressing;Toileting OT Transfers Functional Problem(s): Toilet OT Additional Impairment(s): None OT Plan OT Intensity: Minimum of 1-2 x/day, 45 to 90 minutes OT Frequency: 5 out of 7 days OT Duration/Estimated Length of Stay: 10-14 days OT Treatment/Interventions: Balance/vestibular training;Discharge planning;Disease mangement/prevention;Pain management;Neuromuscular re-education;Functional mobility training;DME/adaptive equipment instruction;Patient/family education;Psychosocial support;Self Care/advanced ADL retraining;Splinting/orthotics;UE/LE Strength taining/ROM;Therapeutic Activities;Therapeutic Exercise;Wheelchair propulsion/positioning OT Basic Self-Care Anticipated Outcome(s): Supervision OT Toileting Anticipated Outcome(s): Supervision OT Bathroom Transfers Anticipated Outcome(s): Supervision OT Recommendation Patient destination: Home Follow Up Recommendations: Home health OT Equipment Recommended: 3 in 1 bedside comode   OT Evaluation Precautions/Restrictions  Precautions Precautions: Fall;Back Required Braces or Orthoses: Spinal Brace Spinal Brace: Lumbar corset;Applied in sitting position Restrictions Weight Bearing Restrictions: No Pain Pain Assessment Pain Scale: 0-10 Pain Score: 10-Worst pain ever Pain Type: Surgical pain Pain Location: Back Pain Orientation: Lower Pain Descriptors / Indicators: Aching;Discomfort;Grimacing;Moaning Pain Intervention(s): RN made aware;Repositioned Home Living/Prior Functioning Home Living Family/patient expects to be discharged to:: Private residence Living Arrangements: Spouse/significant other Available Help at Discharge: Family, Available  PRN/intermittently (daughter has been helping in the afternoons) Type of Home: House Home Access: Ramped entrance Home Layout: One  level Bathroom Shower/Tub: Multimedia programmer: Handicapped height Additional Comments: reports daughter can help a few days  Lives With: Spouse IADL History Homemaking Responsibilities: No Current License: No Prior Function Level of Independence: Independent with basic ADLs, Requires assistive device for independence, Needs assistance with homemaking Driving: No Vision Baseline Vision/History: Wears glasses Wears Glasses: At all times Patient Visual Report: No change from baseline Vision Assessment?: No apparent visual deficits Perception  Perception: Within Functional Limits Praxis Praxis: Intact Cognition Arousal/Alertness: Awake/alert (keeps eyes closed majority of session) Orientation Level: Person;Situation;Place Person: Oriented Place: Oriented Situation: Oriented Year: 2021 Month: October Day of Week: Incorrect (Thursday) Immediate Memory Recall: Sock;Blue;Bed Memory Recall Sock: Without Cue Memory Recall Blue: Without Cue Memory Recall Bed: Without Cue Attention: Sustained Sustained Attention: Appears intact Awareness: Impaired Problem Solving: Impaired Safety/Judgment: Appears intact Sensation Sensation Light Touch: Impaired by gross assessment (diminished on LLE) Proprioception: Appears Intact (UE) Coordination Gross Motor Movements are Fluid and Coordinated: No Fine Motor Movements are Fluid and Coordinated: Yes Finger Nose Finger Test: WNL Motor  Motor Motor: Abnormal postural alignment and control Motor - Skilled Clinical Observations: grossly uncoordinated due to pain, generalized weakness, decreased balance/postural control  Trunk/Postural Assessment  Cervical Assessment Cervical Assessment: Exceptions to [facility] (forward head) Thoracic Assessment Thoracic Assessment: Exceptions to [facility] (mild kyphosis) Lumbar  Assessment Lumbar Assessment: Exceptions to [facility] (posterior pelvic tilt) Postural Control Postural Control: Deficits on evaluation  Balance Balance Balance Assessed: Yes Static Sitting Balance Static Sitting - Balance Support: Feet supported;Bilateral upper extremity supported Static Sitting - Level of Assistance: 5: Stand by assistance (supervision) Dynamic Sitting Balance Dynamic Sitting - Balance Support: Feet supported;Bilateral upper extremity supported Dynamic Sitting - Level of Assistance: 5: Stand by assistance (CGA) Static Standing Balance Static Standing - Balance Support: Bilateral upper extremity supported (RW) Static Standing - Level of Assistance: 5: Stand by assistance (CGA) Dynamic Standing Balance Dynamic Standing - Balance Support: Bilateral upper extremity supported (RW) Dynamic Standing - Level of Assistance: 3: Mod assist Extremity/Trunk Assessment RUE Assessment RUE Assessment: Within Functional Limits LUE Assessment LUE Assessment: Within Functional Limits  Care Tool Care Tool Self Care Eating        Oral Care         Bathing   Body parts bathed by patient: Right arm;Left arm;Chest;Abdomen;Face Body parts bathed by helper: Front perineal area;Buttocks;Right upper leg;Left upper leg;Right lower leg;Left lower leg   Assist Level: Maximal Assistance - Patient 24 - 49%    Upper Body Dressing(including orthotics)   What is the patient wearing?: Hospital gown only   Assist Level: Moderate Assistance - Patient 50 - 74%    Lower Body Dressing (excluding footwear)   What is the patient wearing?: Underwear/pull up Assist for lower body dressing: Total Assistance - Patient < 25%    Putting on/Taking off footwear   What is the patient wearing?: Non-skid slipper socks Assist for footwear: Dependent - Patient 0%       Care Tool Toileting Toileting activity   Assist for toileting: Total Assistance - Patient < 25%     Care Tool Bed Mobility Roll left  and right activity   Roll left and right assist level: Moderate Assistance - Patient 50 - 74%    Sit to lying activity        Lying to sitting edge of bed activity   Lying to sitting edge of bed assist level: Moderate Assistance - Patient 50 - 74%     Care Tool Transfers Sit to  stand transfer   Sit to stand assist level: Moderate Assistance - Patient 50 - 74%    Chair/bed transfer   Chair/bed transfer assist level: Moderate Assistance - Patient 50 - 74%     Toilet transfer   Assist Level: Moderate Assistance - Patient 50 - 74%     Care Tool Cognition Expression of Ideas and Wants Expression of Ideas and Wants: Without difficulty (complex and basic) - expresses complex messages without difficulty and with speech that is clear and easy to understand   Understanding Verbal and Non-Verbal Content Understanding Verbal and Non-Verbal Content: Understands (complex and basic) - clear comprehension without cues or repetitions   Memory/Recall Ability *first 3 days only Memory/Recall Ability *first 3 days only: Current season;That he or she is in a hospital/hospital unit    Refer to Care Plan for [place] 1 OT Short Term Goal 1 (Week 1): Pt will don pants with min assist with AE as needed OT Short Term Goal 2 (Week 1): Pt will complete sit > stand as needed for toileting and LB dressing with min assist OT Short Term Goal 3 (Week 1): Pt will complete bathing with min assist with AE as needed OT Short Term Goal 4 (Week 1): Pt will complete toilet transfer with min assist, ambulating with RW  Recommendations for other services: None    Skilled Therapeutic Intervention OT eval completed with discussion of rehab process, OT purpose, POC, ELOS, and goals.  Pt in excruciating pain at bed, with initial hesitancy to engage in any activity due to pain.  Therapist encouraged pt to engage in mobility to work through pain.  ADL assessment completed at EOB with mod assist  for bed mobility to come to EOB.  Engaged in bathing and dressing from EOB with pt able to complete UB bathing but requiring total assist for LB bathing due to frequent loose BM with standing.  Donned back brace prior to any standing.  Pt required mod assist sit > stand to RW with facilitation for anterior weight shift.  Donned incontinence brief at sit > stand level while pt maintaining standing balance with CGA.  Pt reports pain persisting, but agreeable to getting OOB as she was not comfortable in bed either.  Completed stand pivot transfer with RW min-mod assist and therapist assisting with positioning and management of RW during transfer.  Pt remained upright in w/c with all needs in reach.  SWK arriving to complete intake assessment.    Pt with frequent c/o pain throughout session, RN aware and not due for any medication at this time.  Pt frequently stating "I just want to die" and "tell that Dr. I want to die"  "I really do".  Therapist providing distraction throughout with pt able to complete tasks with encouragement.   ADL ADL Upper Body Bathing: Setup Where Assessed-Upper Body Bathing: Edge of bed Lower Body Bathing: Dependent Where Assessed-Lower Body Bathing: Edge of bed Upper Body Dressing: Minimal assistance Where Assessed-Upper Body Dressing: Edge of bed Lower Body Dressing: Dependent Where Assessed-Lower Body Dressing: Edge of bed Toileting: Dependent Where Assessed-Toileting: Bedside Commode Toilet Transfer: Moderate assistance Toilet Transfer Method: Stand pivot Toilet Transfer Equipment: Bedside commode Mobility  Bed Mobility Bed Mobility: Rolling Right;Right Sidelying to Sit Rolling Right: Moderate Assistance - Patient 50-74% Right Sidelying to Sit: Moderate Assistance - Patient 50-74% Transfers Sit to Stand: Moderate Assistance - Patient 50-74% Stand to Sit: Minimal Assistance - Patient > 75%   Discharge  Criteria: Patient will be discharged from OT if patient  refuses treatment 3 consecutive times without medical reason, if treatment goals not met, if there is a change in medical status, if patient makes no progress towards goals or if patient is discharged from hospital.  The above assessment, treatment plan, treatment alternatives and goals were discussed and mutually agreed upon: by patient  Simonne Come 05/03/2020, 12:04 PM

## 2020-05-03 NOTE — Progress Notes (Signed)
Orthopedic Tech Progress Note Patient Details:  [name] 15-Aug-1929 [id number] Called in order to [facility] for a BLE PRAFO BOOTS  Patient ID: [name], female   DOB: 06/11/1930, 86 y.o.   MRN: [id number]   Janit Pagan 05/03/2020, 1:13 PM

## 2020-05-03 NOTE — Progress Notes (Signed)
Bilateral Lower Ext. study completed.   See CVProc for preliminary results.   Chrissi Crow, RDMS, RVT 

## 2020-05-03 NOTE — Progress Notes (Signed)
Patient Details  Name: [name] MRN: [id number] Date of Birth: 04-21-1930  Today's Date: 05/03/2020  Hospital Problems: Principal Problem:   Lumbar radiculopathy Active Problems:   Constipation   Hyponatremia   Hypoalbuminemia due to protein-calorie malnutrition (HCC)   Benign essential HTN   Stage 3b chronic kidney disease (HCC)   Acute on chronic combined systolic and diastolic heart failure (HCC)   Chronic pain syndrome   Controlled type 2 diabetes mellitus with hyperglycemia, without long-term current use of insulin (HCC)  Past Medical History:  Past Medical History:  Diagnosis Date  . Anemia   . Arthritis   . Bilateral edema of lower extremity   . 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 Chronic lower back pain   . CKD (chronic kidney disease) stage 3, GFR 30-59 ml/min (HCC)   . DDD (degenerative disc disease)    lumbar  . Diverticulosis   . Family history of breast cancer   . Family history of pancreatic cancer   . Family history of prostate cancer   . Flaccid neuropathic bladder, not elsewhere classified   . Foley catheter in place   . GERD (gastroesophageal reflux disease)   . Hemorrhoids   . Hiatal hernia   . History of colon polyps   . History of radiation therapy 09-12-2015 to 10-12-2015   42.72 gray in 16 fractions directed right breast w/ boost of 12 gray in 6 fractions directed at the lumpectomy cavity- Total dose: 54.72y  . History of recurrent UTIs   . Hyperlipidemia   . PONV (postoperative nausea and vomiting)   . RBBB (right bundle branch block with left anterior fascicular block)   . Type 2 diabetes mellitus ([place])   . Urinary retention with incomplete bladder emptying   . Wears dentures    full upper and lower partial  . Wears  glasses   . Wears hearing aid    bilateral but does not wear   Past Surgical History:  Past Surgical History:  Procedure Laterality Date  . BACK SURGERY    . BREAST LUMPECTOMY WITH RADIOACTIVE SEED LOCALIZATION Right 08/03/2015   Procedure: BREAST LUMPECTOMY WITH RADIOACTIVE SEED LOCALIZATION;  Surgeon: [name], MD;  Location: [place];  Service: General;  Laterality: Right;  . BREAST SURGERY    . CARDIOVASCULAR STRESS TEST  01/14/2012   Low risk nuclear study w/ small mild apical and apical lateral reversible perfusion defect represents a small area of ischemia versus shifting breast artifact/  normal LV funciton and wall motion, ef 86%  . CARPAL TUNNEL RELEASE Right 1977  . CATARACT EXTRACTION W/ INTRAOCULAR LENS  IMPLANT, BILATERAL Bilateral left 1996/  right 1997  . CLOSED RIGHT KNEE MANIPULATION  08/11/2002   post TKA  . CYSTOSTOMY N/A 05/17/2016   Procedure: CYSTOSCOPY WITH  SUPRAPUBIC PLACMENT;  Surgeon: [name], MD;  Location: [facility];  Service: Urology;  Laterality: N/A;  . EYE SURGERY    . GANGLION CYST EXCISION Right 12/09/2001   right palm  . HERNIA REPAIR    . JOINT REPLACEMENT    . KNEE ARTHROSCOPY Right 12/14/2002   w/ Lysis Adhesions  . LAMINECTOMY  04/24/2020   Bilateral redo L2-3, L3-4 and L4-5 laminotomy/laminectomy/foraminotomies/medial facetectomy to decompress the bilateral L3, L4  and L5 nerve roots  . LUMBAR LAMINECTOMY/DECOMPRESSION MICRODISCECTOMY N/A 03/06/2016   Procedure: CENTRAL DECOMPRESSION L3 - L4 ,L4 - L5;  Surgeon: [name], MD;  Location: [facility];  Service: Orthopedics;  Laterality: N/A;  . SHOULDER HEMI-ARTHROPLASTY Right 02/20/2009   avascular necrosis   . TOTAL KNEE ARTHROPLASTY Bilateral right 06-23-2002/  left 07-11-2008  . [place]  . VAGINAL HYSTERECTOMY  1975   Social History:  reports that she has never smoked. She has never used smokeless tobacco. She reports  that she does not drink alcohol and does not use drugs.  Family / Support Systems Marital Status: Married Patient Roles: Spouse, Parent Spouse/Significant Other: Herbie [place] [phone]-home Children: [name]-daughter [phone] Other Supports: Friends and church members Anticipated Caregiver: [name] Ability/Limitations of Caregiver: Min assist husband can assist some also Caregiver Availability: 24/7 Family Dynamics: close knit family of three who pull together in times of need. Pt was at [facility] in June to get rehab then went home with home health. Daughter goes over daily and assists with home management.  Social History Preferred language: English Religion: Baptist Cultural Background: No issues Education: HS Read: Yes Write: Yes Employment Status: Retired Public relations account executive Issues: No issues Guardian/Conservator: None-according to MD pt is capable of making her own decisions while here. Wants her daughter included and will be here daily   Abuse/Neglect Abuse/Neglect Assessment Can Be Completed: Yes Physical Abuse: Denies Verbal Abuse: Denies Sexual Abuse: Denies Exploitation of patient/patient's resources: Denies Self-Neglect: Denies  Emotional Status Pt's affect, behavior and adjustment status: Pt is having pain today and is trying to do therapies, but it is difficult. She wants the MD to try to manage her pain so she can do more but it has been on-going since acute. She wants to be able to ambulate with a rolling walker at discharge. Recent Psychosocial Issues: other health issues-was in a SNF in June and had Home health after that Psychiatric History: No issues deferred depression screening due to pt is coping appropriately but do feel she would benefit from seeing neuro-psych while here. Substance Abuse History: No issues  Patient / Family Perceptions, Expectations & Goals Pt/Family understanding of illness & functional limitations: Pt and daughter can  explain her bak surgery and pain as a result of this. Daughter hopes they can come up with a happy medium regarding her pain. Both have spoken with the MD and feel they have a good understanding of her treatment plan going forward. Premorbid pt/family roles/activities: Wife, Mother, friend, retiree, church member, etc Anticipated changes in roles/activities/participation: resume Pt/family expectations/goals: Pt states: " I feel nauseous due to my pain."  Daughter states: " I hope she can get back to walking with a walker like she did before."  [employer]: Other (Comment) (been to [facility] in June 2021 for rehab) Premorbid Home Care/DME Agencies: Other (Comment) (has rw, bsc, tub seat [place] to see at [place]) Transportation available at discharge: Daughter and husband Resource referrals recommended: Neuropsychology  Discharge Planning Living Arrangements: Spouse/significant other Support Systems: Spouse/significant other, Children, Friends/neighbors Type of Residence: Private residence Insurance Resources: [employer], Multimedia programmer (specify) Nurse, mental health) Financial Resources: [place] Referred: No Living Expenses: Own Money Management: Patient, Spouse Does the patient have any problems obtaining your medications?: No Home Management: Husband and daughter Patient/Family Preliminary Plans: Return home with husband who can assist with home management, daughter comes over daily and assists with home management-cooking, cleaning, etc. Daughter stays with from 11-6  pm daily. Care Coordinator Anticipated Follow Up Needs: HH/OP  Clinical Impression Pleasant patient who is in noticeable pain and not feeling well. She has tried to participate in therapies this am. Her daughter is present and provides support to pt. She will be assisting at discharge. Made aware team conference today and not able to set target date due to PT had not seen yet. Will know more  next Wed at conference. Have placed pt on neuro-psych list to be seen next week. Continue to work on discharge needs.  Elease Hashimoto 05/03/2020, 1:02 PM

## 2020-05-03 NOTE — Progress Notes (Signed)
Inpatient Rehabilitation Center Individual Statement of Services  Patient Name:  [name]  Date:  05/03/2020  Welcome to the [place].  Our goal is to provide you with an individualized program based on your diagnosis and situation, designed to meet your specific needs.  With this comprehensive rehabilitation program, you will be expected to participate in at least 3 hours of rehabilitation therapies Monday-Friday, with modified therapy programming on the weekends.  Your rehabilitation program will include the following services:  Physical Therapy (PT), Occupational Therapy (OT), 24 hour per day rehabilitation nursing, Neuropsychology, Care Coordinator, Rehabilitation Medicine, Nutrition Services and Pharmacy Services  Weekly team conferences will be held on wednesday to discuss your progress.  Your Inpatient Rehabilitation Care Coordinator will talk with you frequently to get your input and to update you on team discussions.  Team conferences with you and your family in attendance may also be held.  Expected length of stay: 10-14 days  Overall anticipated outcome: supervision-cueing  Depending on your progress and recovery, your program may change. Your Inpatient Rehabilitation Care Coordinator will coordinate services and will keep you informed of any changes. Your Inpatient Rehabilitation Care Coordinator's name and contact numbers are listed  below.  The following services may also be recommended but are not provided by the [place]:    [place] will be made to provide these services after discharge if needed.  Arrangements include referral to agencies that provide these services.  Your insurance has been verified to be:  [place] Your primary doctor is:  Shanon Ace  Pertinent information will be shared with your doctor and your insurance  company.  Inpatient Rehabilitation Care Coordinator:  Ovidio Kin, [place] or Emilia Beck  Information discussed with and copy given to patient by: Elease Hashimoto, 05/03/2020, 1:04 PM

## 2020-05-03 NOTE — Patient Care Conference (Signed)
Inpatient RehabilitationTeam Conference and Plan of Care Update Date: 05/03/2020   Time: 11:40 AM    Patient Name: [name]      Medical Record Number: [id number]  Date of Birth: 11/17/1929 Sex: Female         Room/Bed: 4W17C/4W17C-01 Payor Info: Payor: MEDICARE / Plan: MEDICARE PART A AND B / Product Type: *No Product type* /    Admit Date/Time:  05/02/2020  5:12 PM  Primary Diagnosis:  Lumbar radiculopathy  Hospital Problems: Principal Problem:   Lumbar radiculopathy Active Problems:   Constipation   Hyponatremia   Hypoalbuminemia due to protein-calorie malnutrition (HCC)   Benign essential HTN   Stage 3b chronic kidney disease ([place])   Acute on chronic combined systolic and diastolic heart failure (HCC)   Chronic pain syndrome   Controlled type 2 diabetes mellitus with hyperglycemia, without long-term current use of insulin ([place])    Expected Discharge Date: Expected Discharge Date:  (reconf next week for discharge date)  Team Members Present: Physician leading conference: Dr. Delice Lesch Care Coodinator Present: Dorien Chihuahua, RN, BSN, CRRN;Becky Dupree, LCSW Nurse Present: Serena Croissant, LPN PT Present: Becky Sax, PT OT Present: Simonne Come, OT PPS Coordinator present : Ileana Ladd, PT     Current Status/Progress Goal Weekly Team Focus  Bowel/Bladder             Swallow/Nutrition/ Hydration             ADL's   Mod assist sit > stand, Mod assist stand pivot transfer, Total assist LB dressing, Mod-max assist bathing  Supervision  ADL retraining, functional transfers, sit > stand, dynamic standing balance, endurance   Mobility   bed mobility mod/max A, transfers with RW mod A, gait 26ft with RW min A, WC mobility 22ft supervision  supervision  functional mobility/transfers, generalized strengthening, dynamic standing balance/coordination, ambulation, and endurance   Communication             Safety/Cognition/ Behavioral Observations            Pain              Skin               Discharge Planning:      Team Discussion: Diffuse pain with medications ordered PTA and MD adjusting meds slightly on rehab and added lidocaine patch. Monitoring DM. Constipation issues with pain medications, laxatives added and monitoring sodium level.  Patient on target to meet rehab goals: yes, supervision goals set  *See Care Plan and progress notes for long and short-term goals.   Revisions to Treatment Plan:   Teaching Needs: Transfers, toileting, medications, etc.  Current Barriers to Discharge: None noted  Possible Resolutions to Barriers:      Medical Summary Current Status: Functional and mobility deficits secondary to lumbar stenosis with neurogenic claudication, s/p re-do laminectomy with decompression of bilateral L3,L4,L5 nerve roots on 04/24/20.  Barriers to Discharge: Medical stability;Decreased family/caregiver support;Other (comments)  Barriers to Discharge Comments: Chronic pain Possible Resolutions to Barriers/Weekly Focus: Therapies, follow labs- Na, Cr, optimize bowel meds, optimize DM meds, encouragement for pain   Continued Need for Acute Rehabilitation Level of Care: The patient requires daily medical management by a physician with specialized training in physical medicine and rehabilitation for the following reasons: Direction of a multidisciplinary physical rehabilitation program to maximize functional independence : Yes Medical management of patient stability for increased activity during participation in an intensive rehabilitation regime.: Yes Analysis of laboratory values and/or  radiology reports with any subsequent need for medication adjustment and/or medical intervention. : Yes   I attest that I was present, lead the team conference, and concur with the assessment and plan of the team.   Dorien Chihuahua B 05/03/2020, 4:48 PM

## 2020-05-04 ENCOUNTER — Inpatient Hospital Stay (HOSPITAL_COMMUNITY): Payer: Medicare Other

## 2020-05-04 LAB — GLUCOSE, CAPILLARY
Glucose-Capillary: 104 mg/dL — ABNORMAL HIGH (ref 70–99)
Glucose-Capillary: 124 mg/dL — ABNORMAL HIGH (ref 70–99)
Glucose-Capillary: 137 mg/dL — ABNORMAL HIGH (ref 70–99)
Glucose-Capillary: 118 mg/dL — ABNORMAL HIGH (ref 70–99)

## 2020-05-04 MED ORDER — FLEET ENEMA 7-19 GM/118ML RE ENEM
1.0000 | ENEMA | Freq: Once | RECTAL | Status: AC
  Administered 2020-05-04: 1 via RECTAL
  Filled 2020-05-04: qty 1

## 2020-05-04 NOTE — Progress Notes (Signed)
Occupational Therapy Session Note  Patient Details  Name: [name] MRN: [id number] Date of Birth: 12/14/1929  Today's Date: 05/04/2020 OT Individual Time: 0700-0800 OT Individual Time Calculation (min): 60 min    Short Term Goals: Week 1:  OT Short Term Goal 1 (Week 1): Pt will don pants with min assist with AE as needed OT Short Term Goal 2 (Week 1): Pt will complete sit > stand as needed for toileting and LB dressing with min assist OT Short Term Goal 3 (Week 1): Pt will complete bathing with min assist with AE as needed OT Short Term Goal 4 (Week 1): Pt will complete toilet transfer with min assist, ambulating with RW  Skilled Therapeutic Interventions/Progress Updates:    1:1. Pt received at supine. Pt agreeable to OT. Pt reporting back pain and brace applied in sitting for support and provides minimal relief. Pt supine>sitting EOB with A to bring legs off EOB. Pt sits enires session at EOB with S while donning shirt with se tup, LSO with MIN A to bring around back (MAX VC for application process) and pants/ socks with MAX step by step cues for AE technique using dressing stick and sock aide. Pt requires significantly increased time to process commands. MOD A  Sit to stand at EOB with RW from elevated surface while OT advances pants past hips. Of note pt states, "I wish the lord would just take me. This is no way to live." provided reassurance that the healing process takes time and requires effort to recover from surgery. Pt verbalized understanding. Exited session with pt seated in bed, exit alarm on and call light in reach   Therapy Documentation Precautions:  Precautions Precautions: Fall, Back Required Braces or Orthoses: Spinal Brace Spinal Brace: Lumbar corset, Applied in sitting position Restrictions Weight Bearing Restrictions: No General:   Vital Signs: Therapy Vitals Temp: 98.6 F (37 C) Pulse Rate: (!) 104 Resp: 18 BP: 111/72 Patient Position (if  appropriate): Lying Oxygen Therapy SpO2: 94 % O2 Device: Room Air Pain: Pain Assessment Pain Scale: 0-10 Pain Score: 4  Pain Type: Surgical pain ADL: ADL Upper Body Bathing: Setup Where Assessed-Upper Body Bathing: Edge of bed Lower Body Bathing: Dependent Where Assessed-Lower Body Bathing: Edge of bed Upper Body Dressing: Minimal assistance Where Assessed-Upper Body Dressing: Edge of bed Lower Body Dressing: Dependent Where Assessed-Lower Body Dressing: Edge of bed Toileting: Dependent Where Assessed-Toileting: Bedside Commode Toilet Transfer: Moderate assistance Toilet Transfer Method: Stand pivot Toilet Transfer Equipment: Art gallery manager    Praxis   Exercises:   Other Treatments:     Therapy/Group: Individual Therapy  Tonny Branch 05/04/2020, 6:51 AM

## 2020-05-04 NOTE — Progress Notes (Signed)
Physical Therapy Session Note  Patient Details  Name: [name] MRN: [id number] Date of Birth: Nov 09, 1929  Today's Date: 05/04/2020 PT Individual Time: 7106-2694 and 1301-1410 PT Individual Time Calculation (min): 55 min and 69 min  Short Term Goals: Week 1:  PT Short Term Goal 1 (Week 1): pt will perform bed mobility with min A consistantly PT Short Term Goal 2 (Week 1): pt will transfer bed<>WC with LRAD min A PT Short Term Goal 3 (Week 1): Pt will ambulate 23ft with LRAD CGA  Skilled Therapeutic Interventions/Progress Updates:   Treatment Session 1: 0916-1011 55 min  Received pt supine in bed, pt agreeable to therapy, and reported pain 10/10 in low back and L leg and ankle. RN notified and stated pt already received pain medication this morning. Repositioning, rest breaks, and distraction done to reduce pain levels. Session with emphasis on functional mobility/transfers, generalized strengthening, dynamic standing balance/coordination, ambulation, and improved activity tolerance. Pt requires increased time with all functional mobility due to pain and poor activity tolerance. Pt transferred supine<>sitting EOB with HOB elevated and use of bedrails with mod A. Donned LSO and shoes total A sitting EOB. Pt reported feeling dizzy upon sitting EOB but symptoms resolved within a few minutes. Pt transferred bed<>WC stand<>pivot with RW and min A and transported to dayroom in [facility] total A for time management purposes. Pt transferred sit<>stand with RW min/mod A and ambulated 75ft x 2 trials with RW and CGA/min A. Pt performed seated bilateral LE strengthening on Kinetron at 20 cm/sec for 30 seconds x 4 trials. Pt reported this activity was "hard" and required multiple rest breaks in between trials. Worked on dynamic standing balance tossing horseshoes with R UE with min A for balance x 1 trial. Pt transported back to room in [facility] total A and ambulated 31ft with RW and min A to recliner. Concluded session  with pt sitting in recliner, needs within reach, and chair pad alarm on.   Treatment Session 2: 1301-1410 69 min Received pt sitting in recliner finishing lunch with daughter present at bedside, pt agreeable to therapy, and reported pain 10/10 in low back. RN aware and reported pt is up to date on pain medications. Repositioning, rest breaks, and distraction done to reduce pain levels. Session with emphasis on functional mobility/transfers, generalized strengthening, dynamic standing balance/coordination, ambulation, and improved activity tolerance. Pt requires increased time with all functional mobility due to pain and poor activity tolerance and tends to grunt and moan with mobility due to pain. Pt transferred sit<>stand from recliner with RW and mod A and ambulated 79ft with RW and min A to WC. Pt transported to dayroom in [facility] dependently for energy conservation purposes and requested to finish game of horseshoes from this morning. Worked on dynamic standing balance throwing horseshoes with R UE x 4 trials with CGA for balance. Pt ambulated 60ft x 2 trials with RW and CGA/min A. Pt demonstrated decreased weight shifting to L, step to pattern, decreased trunk rotation, and decreased bilateral foot clearance. Pt required multiple extended rest breaks throughout session. Pt transferred sit<>stand at table in dayroom with mod A x 3 trials and performed the following exercises standing with bilateral UE support and min A for balance: -alternating hip flexion 2x10 (weakness L LE> R LE and decreased ROM bilaterally) -heel raises 2x10 -hip abduction x10 bilaterally  Pt with increased difficulty standing upright and leaning forward onto table to compensate. Pt performed the following exercises sitting in [facility] with supervision and verbal  cues for technique: -LAQ x10 bilaterally -hip flexion x10 bilaterally Noted pt with eyes closed during 75% of session and required cues for attention and focus on task. Pt  transported back to room in [facility] total A and ambulated 20ft with RW and CGA to recliner. Concluded session with pt sitting in recliner, needs within reach, and chair pad alarm on.   Therapy Documentation Precautions:  Precautions Precautions: Fall, Back Required Braces or Orthoses: Spinal Brace Spinal Brace: Lumbar corset, Applied in sitting position Restrictions Weight Bearing Restrictions: No  Therapy/Group: Individual Therapy Alfonse Alpers PT, DPT   05/04/2020, 7:19 AM

## 2020-05-04 NOTE — Progress Notes (Signed)
[facility] PHYSICAL MEDICINE & REHABILITATION PROGRESS NOTE  Subjective/Complaints: Patient seen sitting up in bed this morning.  She states she did not sleep well overnight.  She states she did not have a good first day of therapies due to pain.  When asked about her pain, she states it is all over.  She does not recall having a Lidoderm patch placed on her.  She states she had some watery stool yesterday, discussion with therapies as well.  Discussed disimpaction with nursing yesterday and again today.  ROS: + Generalized pain, unchanged.  Denies CP, SOB, N/V/D  Objective: Vital Signs: Blood pressure 111/72, pulse (!) 104, temperature 98.6 F (37 C), resp. rate 18, height 5\' 2"  (1.575 m), weight 70.4 kg, SpO2 94 %. DG Abd 1 View  Result Date: 05/02/2020 CLINICAL DATA:  Persistent constipation with vomiting EXAM: ABDOMEN - 1 VIEW COMPARISON:  None. FINDINGS: Surgical hardware in the lumbar spine. Nonobstructed gas pattern with moderate stool in the colon. No radiopaque calculi. Advanced arthritis of the left hip. IMPRESSION: Nonobstructed gas pattern with moderate to large amount of stool in the colon. Electronically Signed   By: Donavan Foil M.D.   On: 05/02/2020 20:16   VAS [place] LOWER EXTREMITY VENOUS (DVT)  Result Date: 05/03/2020  Lower Venous DVTStudy Indications: Edema, and Swelling.  Risk Factors: Immobility. Performing Technologist: Griffin Basil RCT RDMS  Examination Guidelines: A complete evaluation includes B-mode imaging, spectral Doppler, color Doppler, and power Doppler as needed of all accessible portions of each vessel. Bilateral testing is considered an integral part of a complete examination. Limited examinations for reoccurring indications may be performed as noted. The reflux portion of the exam is performed with the patient in reverse Trendelenburg.  +---------+---------------+---------+-----------+----------+--------------+ RIGHT     CompressibilityPhasicitySpontaneityPropertiesThrombus Aging +---------+---------------+---------+-----------+----------+--------------+ CFV      Full           Yes      Yes                                 +---------+---------------+---------+-----------+----------+--------------+ SFJ      Full                                                        +---------+---------------+---------+-----------+----------+--------------+ FV Prox  Full                                                        +---------+---------------+---------+-----------+----------+--------------+ FV Mid   Full                                                        +---------+---------------+---------+-----------+----------+--------------+ FV DistalFull                                                        +---------+---------------+---------+-----------+----------+--------------+  PFV      Full                                                        +---------+---------------+---------+-----------+----------+--------------+ POP      Full           Yes      Yes                                 +---------+---------------+---------+-----------+----------+--------------+ PTV      Full                                                        +---------+---------------+---------+-----------+----------+--------------+ PERO     Full                                                        +---------+---------------+---------+-----------+----------+--------------+   +---------+---------------+---------+-----------+----------+--------------+ LEFT     CompressibilityPhasicitySpontaneityPropertiesThrombus Aging +---------+---------------+---------+-----------+----------+--------------+ CFV      Full           Yes      Yes                                 +---------+---------------+---------+-----------+----------+--------------+ SFJ      Full                                                         +---------+---------------+---------+-----------+----------+--------------+ FV Prox  Full                                                        +---------+---------------+---------+-----------+----------+--------------+ FV Mid   Full                                                        +---------+---------------+---------+-----------+----------+--------------+ FV DistalFull                                                        +---------+---------------+---------+-----------+----------+--------------+ PFV      Full                                                        +---------+---------------+---------+-----------+----------+--------------+  POP      Full           Yes      Yes                                 +---------+---------------+---------+-----------+----------+--------------+ PTV      Full                                                        +---------+---------------+---------+-----------+----------+--------------+ PERO     Full                                                        +---------+---------------+---------+-----------+----------+--------------+     Summary: RIGHT: - There is no evidence of deep vein thrombosis in the lower extremity.  - No cystic structure found in the popliteal fossa.  LEFT: - There is no evidence of deep vein thrombosis in the lower extremity.  - No cystic structure found in the popliteal fossa.  *See table(s) above for measurements and observations. Electronically signed by Harold Barban MD on 05/03/2020 at 8:55:38 PM.    Final    Recent Labs    05/03/20 0609  WBC 6.2  HGB 7.4*  HCT 23.5*  PLT 453*   Recent Labs    05/03/20 0609  NA 134*  K 4.0  CL 95*  CO2 27  GLUCOSE 100*  BUN 16  CREATININE 1.32*  CALCIUM 8.8*    Intake/Output Summary (Last 24 hours) at 05/04/2020 1159 Last data filed at 05/04/2020 0842 Gross per 24 hour  Intake 460 ml  Output 600 ml  Net -140 ml         Physical Exam: BP 111/72 (BP Location: Right Arm)   Pulse (!) 104   Temp 98.6 F (37 C)   Resp 18   Ht 5\' 2"  (1.575 m)   Wt 70.4 kg   SpO2 94%   BMI 28.39 kg/m  Constitutional: No distress . Vital signs reviewed. HENT: Normocephalic.  Atraumatic. Eyes: EOMI. No discharge. Cardiovascular: No JVD.  RRR. Respiratory: Normal effort.  No stridor.  Bilateral clear to auscultation. GI: Non-distended.  BS +. Skin: Warm and dry.  Back incision with dressing CDI Psych: Normal mood.  Normal behavior. Musc: Generalized tenderness.  No edema. Neuro:  Alert and oriented. HOH Motor: B/l UE 5/5.  B/l LE: 2+/5 HF, 3- KE and 4/5 ADF/PF, with some pain inhibition, unchanged  Assessment/Plan: 1. Functional deficits secondary to lumbar stenosis with neurogenic claudication s/p redo laminectomy with decompression b/l L3-5 which require 3+ hours per day of interdisciplinary therapy in a comprehensive inpatient rehab setting.  Physiatrist is providing close team supervision and 24 hour management of active medical problems listed below.  Physiatrist and rehab team continue to assess barriers to discharge/monitor patient progress toward functional and medical goals   Care Tool:  Bathing    Body parts bathed by patient: Right arm, Left arm, Chest, Abdomen, Face   Body parts bathed by helper: Front perineal area, Buttocks, Right upper leg, Left upper leg, Right lower leg, Left lower leg  Bathing assist Assist Level: Maximal Assistance - Patient 24 - 49%     Upper Body Dressing/Undressing Upper body dressing   What is the patient wearing?: Pull over shirt, Hospital gown only    Upper body assist Assist Level: Moderate Assistance - Patient 50 - 74%    Lower Body Dressing/Undressing Lower body dressing      What is the patient wearing?: Incontinence brief, Pants     Lower body assist Assist for lower body dressing: Maximal Assistance - Patient 25 - 49%     Toileting Toileting     Toileting assist Assist for toileting: Total Assistance - Patient < 25%     Transfers Chair/bed transfer  Transfers assist     Chair/bed transfer assist level: Minimal Assistance - Patient > 75%     Locomotion Ambulation   Ambulation assist      Assist level: Contact Guard/Touching assist Assistive device: Walker-rolling Max distance: 15ft   Walk 10 feet activity   Assist     Assist level: Contact Guard/Touching assist Assistive device: Walker-rolling   Walk 50 feet activity   Assist Walk 50 feet with 2 turns activity did not occur: Safety/medical concerns (fatigue, pain, generalized weakness)         Walk 150 feet activity   Assist Walk 150 feet activity did not occur: Safety/medical concerns (fatigue, pain, generalized weakness)         Walk 10 feet on uneven surface  activity   Assist Walk 10 feet on uneven surfaces activity did not occur: Safety/medical concerns (fatigue, pain, generalized weakness)         Wheelchair     Assist Will patient use wheelchair at discharge?: Yes Type of Wheelchair: Manual    Wheelchair assist level: Supervision/Verbal cueing Max wheelchair distance: 52ft    Wheelchair 50 feet with 2 turns activity    Assist        Assist Level: Moderate Assistance - Patient 50 - 74%   Wheelchair 150 feet activity     Assist      Assist Level: Total Assistance - Patient < 25%    Medical Problem List and Plan: 1.Functional and mobility deficitssecondary to lumbar stenosis with neurogenic claudication, s/p re-do laminectomy with decompression of bilateral L3,L4,L5 nerve roots on 04/24/20.  Continue CIR 2. Antithrombotics: -DVT/anticoagulation:Mechanical:Sequential compression devices, below kneeBilateral lower extremities -antiplatelet therapy: N/A 3. Pain Management:  Cont Gabapentin 300mg  daily qhs  Continue Oxycontin mg  15 BID  Cont tramadol 50 TID  Cont Percocet 5 q4  PRN  Lidocaine patch ordered  D/cedVicodin per report of confusion  Constipation may be contributing to pain as well  Monitor with increased exertion  4. Mood:LCSW to follow for evaluation and support. -antipsychotic agents: N/A 5. Neuropsych: This patientis not fullycapable of making decisions on hisown behalf. 6. Skin/Wound Care:Monitor wound for healing 7. Fluids/Electrolytes/Nutrition:Monitor I/Os. 8. T2DM with hyperglycemia: On glucotrol 10 mg am/5 mg pm--monitor for hypoglycemic episodes with rise in SCr. Will Monitor BS ac/hs and use SSI for elevated BS.  Relatively controlled on 10/28  Monitor with increased exertion 9.Acute on chronic anemia:   Continue iron supplement bid  Monitor for signs of bleeding.   Hb 7.4 on 10/27  Cont to monitor 10. CKD stage III: Baseline SCr-1.43-->1.54  Cr.  1.32 on 10/27 11. Flaccid neurogenic bladder. Patient with chronic Foley tubesince 2017 back surgery.DO NOT REMOVE FOLEY TUBE--gets foley change monthly at [facility]. 12.Chronic BLE edema/Hypertension.Monitor BP tid--continue Lasix 40 mg daily.  Blood pressure optimally controlled on 10/28  Monitor with increased mobility 13. GERD: managed by protonix 14. Hyperlipidemia.Continue Lipitor 15. Chronic constipation: Added Miralax bid as colace ineffective.  KUB with constipation  Discussed disimpaction with nursing  Will consider further increase if necessary 16. Hyponatremia  Na 134 on 10/27, cont to monitor 17. Hypoalbuminemia  Supplement initiated on 10/27  LOS: 2 days A FACE TO FACE EVALUATION WAS PERFORMED  Jahira Swiss Lorie Phenix 05/04/2020, 11:59 AM

## 2020-05-04 NOTE — Progress Notes (Signed)
Spoke with patient and daughter regarding the use of opioids and her constipation.  Informed patient and daughter about speaking with therapy on alternative ways to control pain.  I sent a message the PT and OT to review tomorrow with the patient.  Patient is having abdominal pain and back pain.  I placed heat on abdomen trying to get some movement going.  Patient also received an enema and miralax today with very little results.  Patient and daughter voiced understanding.  I will pass information to night nurse.

## 2020-05-05 ENCOUNTER — Ambulatory Visit: Payer: Self-pay | Admitting: *Deleted

## 2020-05-05 ENCOUNTER — Inpatient Hospital Stay (HOSPITAL_COMMUNITY): Payer: Medicare Other | Admitting: Occupational Therapy

## 2020-05-05 ENCOUNTER — Inpatient Hospital Stay (HOSPITAL_COMMUNITY): Payer: Medicare Other

## 2020-05-05 ENCOUNTER — Other Ambulatory Visit: Payer: Self-pay | Admitting: *Deleted

## 2020-05-05 DIAGNOSIS — K219 Gastro-esophageal reflux disease without esophagitis: Secondary | ICD-10-CM

## 2020-05-05 DIAGNOSIS — N319 Neuromuscular dysfunction of bladder, unspecified: Secondary | ICD-10-CM

## 2020-05-05 DIAGNOSIS — K5903 Drug induced constipation: Secondary | ICD-10-CM

## 2020-05-05 LAB — GLUCOSE, CAPILLARY
Glucose-Capillary: 124 mg/dL — ABNORMAL HIGH (ref 70–99)
Glucose-Capillary: 191 mg/dL — ABNORMAL HIGH (ref 70–99)
Glucose-Capillary: 136 mg/dL — ABNORMAL HIGH (ref 70–99)
Glucose-Capillary: 103 mg/dL — ABNORMAL HIGH (ref 70–99)

## 2020-05-05 MED ORDER — PANTOPRAZOLE SODIUM 40 MG PO TBEC
80.0000 mg | DELAYED_RELEASE_TABLET | Freq: Every day | ORAL | Status: DC
  Administered 2020-05-05 – 2020-05-17 (×13): 80 mg via ORAL
  Filled 2020-05-05 (×13): qty 2

## 2020-05-05 MED ORDER — LIDOCAINE 5 % EX PTCH
1.0000 | MEDICATED_PATCH | CUTANEOUS | Status: DC
  Administered 2020-05-05 – 2020-05-16 (×12): 1 via TRANSDERMAL
  Filled 2020-05-05 (×12): qty 1

## 2020-05-05 MED ORDER — LIDOCAINE 5 % EX PTCH: 1.0000 | MEDICATED_PATCH | CUTANEOUS | Status: DC

## 2020-05-05 NOTE — Patient Outreach (Signed)
[facility]) Care Management  05/05/2020  KAYLEANNA LORMAN [birth year] [id number]   Patient has been admitted to hospital greater than 10 days.  Will close case at this time, hospital liaison aware.  Will anticipate new referral pending patient and family being agreeable to program.  Valente David, RN, MSN [employer] Manager [phone]

## 2020-05-05 NOTE — IPOC Note (Signed)
Individualized overall Plan of Care (IPOC) Patient Details Name: [name] MRN: [id number] DOB: 1929/12/08  Admitting Diagnosis: Lumbar radiculopathy  Hospital Problems: Principal Problem:   Lumbar radiculopathy Active Problems:   Constipation   Hyponatremia   Hypoalbuminemia due to protein-calorie malnutrition (HCC)   Benign essential HTN   Stage 3b chronic kidney disease (HCC)   Acute on chronic combined systolic and diastolic heart failure (HCC)   Chronic pain syndrome   Controlled type 2 diabetes mellitus with hyperglycemia, without long-term current use of insulin (HCC)     Functional Problem List: Nursing Pain, Motor, Medication Management, Endurance, Bowel, Bladder  PT Balance, Endurance, Motor, Pain  OT Balance, Endurance, Motor, Pain, Safety  SLP    TR         Basic ADL's: OT Grooming, Bathing, Dressing, Toileting     Advanced  ADL's: OT       Transfers: PT Bed Mobility, Bed to Chair, Car, Chief Operating Officer: PT Ambulation, Emergency planning/management officer, Stairs     Additional Impairments: OT None  SLP        TR      Anticipated Outcomes Item Anticipated Outcome  Self Feeding    Swallowing      Basic self-care  Media planner Transfers Supervision  Bowel/Bladder  manage bowel and bladder with min assist  Transfers  supervision with LRAD  Locomotion  supervision with LRAD  Communication     Cognition     Pain  pain level less than 5 on scale of 0-10  Safety/Judgment  remain free of injury, prevent falls with cues and reminders   Therapy Plan: PT Intensity: Minimum of 1-2 x/day ,45 to 90 minutes PT Frequency: 5 out of 7 days PT Duration Estimated Length of Stay: 2 weeks OT Intensity: Minimum of 1-2 x/day, 45 to 90 minutes OT Frequency: 5 out of 7 days OT Duration/Estimated Length of Stay: 10-14 days      Team Interventions: Nursing Interventions Patient/Family Education, Pain  Management, Bladder Management, Bowel Management, Psychosocial Support, Discharge Planning, Medication Management, Disease Management/Prevention  PT interventions Ambulation/gait training, Discharge planning, Functional mobility training, Therapeutic Activities, Balance/vestibular training, Disease management/prevention, Neuromuscular re-education, Therapeutic Exercise, Wheelchair propulsion/positioning, DME/adaptive equipment instruction, Pain management, Splinting/orthotics, UE/LE Strength taining/ROM, Community reintegration, Technical sales engineer stimulation, Patient/family education, IT trainer, UE/LE Coordination activities  OT Interventions Training and development officer, Discharge planning, Disease mangement/prevention, Pain management, Neuromuscular re-education, Functional mobility training, DME/adaptive equipment instruction, Patient/family education, Psychosocial support, Self Care/advanced ADL retraining, Splinting/orthotics, UE/LE Strength taining/ROM, Therapeutic Activities, Therapeutic Exercise, Wheelchair propulsion/positioning  SLP Interventions    TR Interventions    SW/CM Interventions Discharge Planning, Psychosocial Support, Patient/Family Education   Barriers to Discharge MD  Medical stability and Pain  Nursing      PT Decreased caregiver support daughter only able to provide intermittent assist; husband in poor health  OT Decreased caregiver support, Incontinence    SLP      SW       Team Discharge Planning: Destination: PT-Home ,OT- Home , SLP-  Projected Follow-up: PT-Home health PT, OT-  Home health OT, SLP-  Projected Equipment Needs: PT-To be determined, OT- 3 in 1 bedside comode, SLP-  Equipment Details: PT-pt has RW, OT-  Patient/family involved in discharge planning: PT- Patient,  OT-Patient, SLP-   MD ELOS: 10-14 days. Medical Rehab Prognosis:  Good Assessment: 86 year old female with history of T2DM, neurogenic bladder, OA, CKD, DDD with back  pain and  LE pain with neurogenic claudication due to L2-L5 stenosis with spondylolisthesis and was admitted on 04/24/20 for redo L2/3, L3/4 and L4/5 laminotomy, laminectomy with decompression of L3, L4 and L5 nerve roots by Dr. Arnoldo Morale. Post op course significant for issues with generalized weakness with hypotension, somnolence, acute on chronic renal failure with ABLA. Narcotics being titrated upwards for better pain control.  She continues to have limitations due to ongoing pain radiating LLE, poor standing balance as well as difficulty recalling precautions.  Will set goals for Supervision with PT/OT.    Due to the current state of emergency, patients may not be receiving their 3-hours of Medicare-mandated therapy.  See Team Conference Notes for weekly updates to the plan of care

## 2020-05-05 NOTE — Progress Notes (Addendum)
[facility] PHYSICAL MEDICINE & REHABILITATION PROGRESS NOTE  Subjective/Complaints: Patient seen sitting up in her chair this morning working with therapies.  Noted to be nauseous.  She states she slept well overnight.  She also notes improvement in hip pain heel pain.  Discussed nausea with patient therapies-?  Related to narcotics.  Discussed improving participation and progress with therapies, ambulating 40 feet today.,  However, perseverates on being in pain, stating that she spoke to her pastor and does not want to keep living like this, however she appears comfortable sitting in her chair and making functional progress.  Attempted to redirect patient to focus on improvement and decreasing areas of pain.  Patient requests changing of timing of her PPI to home schedule.  ROS: + Generalized pain, unchanged-however notes improvement in multiple areas..  Denies CP, SOB, N/V/D  Objective: Vital Signs: Blood pressure (!) 119/50, pulse 95, temperature 98.6 F (37 C), resp. rate 16, height 5\' 2"  (1.575 m), weight 69.6 kg, SpO2 90 %. VAS [place] LOWER EXTREMITY VENOUS (DVT)  Result Date: 05/03/2020  Lower Venous DVTStudy Indications: Edema, and Swelling.  Risk Factors: Immobility. Performing Technologist: Griffin Basil RCT RDMS  Examination Guidelines: A complete evaluation includes B-mode imaging, spectral Doppler, color Doppler, and power Doppler as needed of all accessible portions of each vessel. Bilateral testing is considered an integral part of a complete examination. Limited examinations for reoccurring indications may be performed as noted. The reflux portion of the exam is performed with the patient in reverse Trendelenburg.  +---------+---------------+---------+-----------+----------+--------------+ RIGHT    CompressibilityPhasicitySpontaneityPropertiesThrombus Aging +---------+---------------+---------+-----------+----------+--------------+ CFV      Full           Yes      Yes                                  +---------+---------------+---------+-----------+----------+--------------+ SFJ      Full                                                        +---------+---------------+---------+-----------+----------+--------------+ FV Prox  Full                                                        +---------+---------------+---------+-----------+----------+--------------+ FV Mid   Full                                                        +---------+---------------+---------+-----------+----------+--------------+ FV DistalFull                                                        +---------+---------------+---------+-----------+----------+--------------+ PFV      Full                                                        +---------+---------------+---------+-----------+----------+--------------+  POP      Full           Yes      Yes                                 +---------+---------------+---------+-----------+----------+--------------+ PTV      Full                                                        +---------+---------------+---------+-----------+----------+--------------+ PERO     Full                                                        +---------+---------------+---------+-----------+----------+--------------+   +---------+---------------+---------+-----------+----------+--------------+ LEFT     CompressibilityPhasicitySpontaneityPropertiesThrombus Aging +---------+---------------+---------+-----------+----------+--------------+ CFV      Full           Yes      Yes                                 +---------+---------------+---------+-----------+----------+--------------+ SFJ      Full                                                        +---------+---------------+---------+-----------+----------+--------------+ FV Prox  Full                                                         +---------+---------------+---------+-----------+----------+--------------+ FV Mid   Full                                                        +---------+---------------+---------+-----------+----------+--------------+ FV DistalFull                                                        +---------+---------------+---------+-----------+----------+--------------+ PFV      Full                                                        +---------+---------------+---------+-----------+----------+--------------+ POP      Full           Yes      Yes                                 +---------+---------------+---------+-----------+----------+--------------+  PTV      Full                                                        +---------+---------------+---------+-----------+----------+--------------+ PERO     Full                                                        +---------+---------------+---------+-----------+----------+--------------+     Summary: RIGHT: - There is no evidence of deep vein thrombosis in the lower extremity.  - No cystic structure found in the popliteal fossa.  LEFT: - There is no evidence of deep vein thrombosis in the lower extremity.  - No cystic structure found in the popliteal fossa.  *See table(s) above for measurements and observations. Electronically signed by Harold Barban MD on 05/03/2020 at 8:55:38 PM.    Final    Recent Labs    05/03/20 0609  WBC 6.2  HGB 7.4*  HCT 23.5*  PLT 453*   Recent Labs    05/03/20 0609  NA 134*  K 4.0  CL 95*  CO2 27  GLUCOSE 100*  BUN 16  CREATININE 1.32*  CALCIUM 8.8*    Intake/Output Summary (Last 24 hours) at 05/05/2020 1025 Last data filed at 05/05/2020 0930 Gross per 24 hour  Intake 800 ml  Output 3150 ml  Net -2350 ml        Physical Exam: BP (!) 119/50 (BP Location: Right Arm)   Pulse 95   Temp 98.6 F (37 C)   Resp 16   Ht 5\' 2"  (1.575 m)   Wt 69.6 kg   SpO2 90%   BMI 28.06  kg/m  Constitutional: No distress . Vital signs reviewed. HENT: Normocephalic.  Atraumatic. Eyes: EOMI. No discharge. Cardiovascular: No JVD.  RRR. Respiratory: Normal effort.  No stridor.  Bilateral clear to auscultation. GI: Non-distended.  BS +. GU: +Foley Skin: Warm and dry.  Neck incision with dressing CDI Psych: Normal mood.  Normal behavior. Musc: Generalized tenderness ,? appears to be improving.  No edema.  Have again Neuro:  Alert and oriented. HOH Motor: B/l UE 5/5. B/l LE: 2+/5 HF, 3- KE and 4/5 ADF/PF, with some pain inhibition, improving  Assessment/Plan: 1. Functional deficits secondary to lumbar stenosis with neurogenic claudication s/p redo laminectomy with decompression b/l L3-5 which require 3+ hours per day of interdisciplinary therapy in a comprehensive inpatient rehab setting.  Physiatrist is providing close team supervision and 24 hour management of active medical problems listed below.  Physiatrist and rehab team continue to assess barriers to discharge/monitor patient progress toward functional and medical goals   Care Tool:  Bathing    Body parts bathed by patient: Right arm, Left arm, Chest, Abdomen, Face   Body parts bathed by helper: Front perineal area, Buttocks, Right upper leg, Left upper leg, Right lower leg, Left lower leg     Bathing assist Assist Level: Maximal Assistance - Patient 24 - 49%     Upper Body Dressing/Undressing Upper body dressing   What is the patient wearing?: Pull over shirt    Upper body assist Assist Level: Moderate Assistance - Patient 50 - 74%  Lower Body Dressing/Undressing Lower body dressing      What is the patient wearing?: Incontinence brief, Pants     Lower body assist Assist for lower body dressing: Maximal Assistance - Patient 25 - 49%     Toileting Toileting    Toileting assist Assist for toileting: Total Assistance - Patient < 25%     Transfers Chair/bed transfer  Transfers assist      Chair/bed transfer assist level: Minimal Assistance - Patient > 75%     Locomotion Ambulation   Ambulation assist      Assist level: Contact Guard/Touching assist Assistive device: Walker-rolling Max distance: 53ft   Walk 10 feet activity   Assist     Assist level: Contact Guard/Touching assist Assistive device: Walker-rolling   Walk 50 feet activity   Assist Walk 50 feet with 2 turns activity did not occur: Safety/medical concerns (fatigue, pain, generalized weakness)         Walk 150 feet activity   Assist Walk 150 feet activity did not occur: Safety/medical concerns (fatigue, pain, generalized weakness)         Walk 10 feet on uneven surface  activity   Assist Walk 10 feet on uneven surfaces activity did not occur: Safety/medical concerns (fatigue, pain, generalized weakness)         Wheelchair     Assist Will patient use wheelchair at discharge?: Yes Type of Wheelchair: Manual    Wheelchair assist level: Supervision/Verbal cueing Max wheelchair distance: 44ft    Wheelchair 50 feet with 2 turns activity    Assist        Assist Level: Moderate Assistance - Patient 50 - 74%   Wheelchair 150 feet activity     Assist      Assist Level: Total Assistance - Patient < 25%    Medical Problem List and Plan: 1.Functional and mobility deficitssecondary to lumbar stenosis with neurogenic claudication, s/p re-do laminectomy with decompression of bilateral L3,L4,L5 nerve roots on 04/24/20.  Continue CIR 2. Antithrombotics: -DVT/anticoagulation:Mechanical:Sequential compression devices, below kneeBilateral lower extremities -antiplatelet therapy: N/A 3. Pain Management:  Cont Gabapentin 300mg  daily qhs  Continue Oxycontin mg  15 BID  Cont tramadol 50 TID  Cont Percocet 5 q4 PRN  Lidocaine patch ordered  D/cedVicodin per report of confusion  Constipation likely contributing to pain as well  Lidoderm  patch ordered for back  Will attempt to avoid increasing pain medication due to confusion, constipation, and nausea  Monitor with increased exertion  4. Mood:LCSW to follow for evaluation and support. -antipsychotic agents: N/A 5. Neuropsych: This patientis not fullycapable of making decisions on hisown behalf. 6. Skin/Wound Care:Monitor wound for healing 7. Fluids/Electrolytes/Nutrition:Monitor I/Os. 8. T2DM with hyperglycemia: On glucotrol 10 mg am/5 mg pm--monitor for hypoglycemic episodes with rise in SCr. Will Monitor BS ac/hs and use SSI for elevated BS.  Relatively controlled on 10/29  Monitor with increased exertion 9.Acute on chronic anemia:   Continue iron supplement bid  Monitor for signs of bleeding.   Hb 7.4 on 10/27  Cont to monitor 10. CKD stage III: Baseline SCr-1.43-->1.54  Cr.  1.32 on 10/27 11. Flaccid neurogenic bladder. Patient with chronic Foley tubesince 2017 back surgery.DO NOT REMOVE FOLEY TUBE--gets foley change monthly at [facility]. 12.Chronic BLE edema/Hypertension.Monitor BP tid--continue Lasix 40 mg daily.   Blood pressure controlled on 10/29  Monitor with increased mobility 13. GERD: managed by protonix, changed timing per patient preference 14. Hyperlipidemia.Continue Lipitor 15. Chronic constipation: Added Miralax bid as colace ineffective.  KUB with constipation  Discussed disimpaction with nursing x3 days, states will do today  Will consider further increase if necessary 16. Hyponatremia  Na 134 on 10/27, cont to monitor 17. Hypoalbuminemia  Supplement initiated on 10/27  LOS: 3 days A FACE TO FACE EVALUATION WAS PERFORMED  Gianno Volner Lorie Phenix 05/05/2020, 10:25 AM

## 2020-05-05 NOTE — Progress Notes (Signed)
Occupational Therapy Session Note  Patient Details  Name: [name] MRN: [id number] Date of Birth: 10/21/1929  Today's Date: 05/05/2020 OT Individual Time: 8546-2703 OT Individual Time Calculation (min): 73 min    Short Term Goals: Week 1:  OT Short Term Goal 1 (Week 1): Pt will don pants with min assist with AE as needed OT Short Term Goal 2 (Week 1): Pt will complete sit > stand as needed for toileting and LB dressing with min assist OT Short Term Goal 3 (Week 1): Pt will complete bathing with min assist with AE as needed OT Short Term Goal 4 (Week 1): Pt will complete toilet transfer with min assist, ambulating with RW  Skilled Therapeutic Interventions/Progress Updates:    Treatment session with focus on self-care retraining, endurance, and functional mobility.  Pt received seated EOB reporting nausea and having thrown up during PT session.  Pt agreeable to bathing at sink.  Completed stand pivot transfer bed > w/c with RW with CGA.  Pt engaged in bathing at sit > stand level at sink with increased time as pt pacing herself due to constant c/o nausea with movement.  RN aware.  Pt completed UB dressing with setup assist, did require min assist for donning back brace.  Engaged in LB bathing at sit > stand level with therapist washing buttocks post incontinent, watery stool.  Pt tolerated standing ~2 mins to allow for hygiene and donning clean incontinence brief.  Pt completed grooming tasks seated at sink with setup assist.  Pt completed short ambulatory transfer with RW to recliner with CGA.  Pt's daughter arrived at end of session and remained in room with pt at completion of session.  Pt remained upright in recliner with chair alarm on and all needs in reach.  Therapy Documentation Precautions:  Precautions Precautions: Fall, Back Required Braces or Orthoses: Spinal Brace Spinal Brace: Lumbar corset, Applied in sitting position Restrictions Weight Bearing Restrictions:  No General:   Vital Signs: Therapy Vitals Temp: 98.4 F (36.9 C) Temp Source: Oral Pulse Rate: 100 Resp: 15 BP: (!) 124/52 Patient Position (if appropriate): Sitting Oxygen Therapy SpO2: (!) 89 % O2 Device: Room Air Pain:  Pt with c/o pain in lower back, not rated.  Premedicated.   Therapy/Group: Individual Therapy  Simonne Come 05/05/2020, 3:12 PM

## 2020-05-05 NOTE — Progress Notes (Signed)
Physical Therapy Session Note  Patient Details  Name: [name] MRN: [id number] Date of Birth: 19-Oct-1929  Today's Date: 05/05/2020 PT Individual Time: 7262-0355 and 1300-1413 PT Individual Time Calculation (min): 55 min and 73 min  Short Term Goals: Week 1:  PT Short Term Goal 1 (Week 1): pt will perform bed mobility with min A consistantly PT Short Term Goal 2 (Week 1): pt will transfer bed<>WC with LRAD min A PT Short Term Goal 3 (Week 1): Pt will ambulate 59ft with LRAD CGA  Skilled Therapeutic Interventions/Progress Updates:   Treatment Session 1 [phone] 55 min Received pt supine in bed, upon entering pt stated "i'm not doing well" but unable to elaborate on why. Pt agreeable to therapy and reported pain 10/10 in low back. RN notified and stated pt not due for more pain medication until 8:45am. Repositioning, rest breaks, and distraction done to reduce pain levels, however pt fixated on pain and feeling nauseous throughout session. Session with emphasis on functional mobility/transfers, dressing, generalized strengthening, dynamic standing balance/coordination, ambulation, and improved activity tolerance. Donned bilateral ted hose total A and pants with max A to thread catheter through. Pt rolled L and R with min A and use of bedrails and required max A to pull pants over hips. Pt transferred supine<>sitting EOB with min A and use of bedrails with cues for logroll technique. Upon sitting EOB pt reported increased dizziness and nausea but reported relief after sitting EOB for ~ 3 minutes. Donned LSO sitting EOB with total A and pt transferred bed<>WC stand<>pivot with RW and min A with cues for technique, AD safety, and to reach back for WC armrests prior to sitting. Pt transported to dayroom in [facility] total A for time management purposes and ambulated 15ft with RW and CGA. Pt requested to sit and with multiple episodes of emesis and returned to room total A. MD present for morning rounds and  therapist informed MD of pt's high pain levels and episodes of emesis. Pt then stated "I hurt so much I just want to die." MD educated pt on recovery timeline and healing process and pt verbalized understanding. Pt ambulated 48ft with RW and CGA to bed and reported feeling like she soiled her brief. RN present and therapist updated RN on pt's current status and episodes of emesis. Concluded session with pt sitting EOB with RN attending to care.   Treatment Session 2: [id number] 21 min Received pt sitting in recliner with daughter present at bedside, pt agreeable to therapy, and reported pain 10/10 in low back (premedicated) but reported continued nausea symptoms. Repositioning, rest breaks, and distraction done to reduce pain levels. Session with emphasis on functional mobility/transfers, generalized strengthening, dynamic standing balance/coordination, ambulation, and improved activity tolerance. Pt transferred sit<>stand with RW min A and ambulated 23ft with RW and CGA to recliner. Pt transported to dayroom in [facility] total A for energy conservation purposes. Pt transferred sit<>stand with RW and min A and worked on dynamic standing balance participating in pumpkin ring toss x 3 trials with CGA for balance. Worked on dynamic sitting balance, fine motor control, and sequencing painting rock. Pt required min cues for sequencing as pt stated "I've never crafted before." Pt required cues for how to clean paintbrushes in water and step by step instructions for order to paint. Pt ambulated 86ft with RW and CGA and worked on dynamic standing balance and fine motor control writing gratitude message on sticky note and posting it on wall with CGA for balance. Pt  transported back to room in [facility] total A and ambulated additional 21ft with RW and CGA to recliner. Concluded session with pt sitting in recliner, needs within reach, and chair pad alarm on. Pt's daughter present at bedside.   Therapy Documentation Precautions:   Precautions Precautions: Fall, Back Required Braces or Orthoses: Spinal Brace Spinal Brace: Lumbar corset, Applied in sitting position Restrictions Weight Bearing Restrictions: No   Therapy/Group: Individual Therapy Alfonse Alpers PT, DPT   05/05/2020, 7:21 AM

## 2020-05-06 ENCOUNTER — Inpatient Hospital Stay (HOSPITAL_COMMUNITY): Payer: Medicare Other | Admitting: Occupational Therapy

## 2020-05-06 ENCOUNTER — Inpatient Hospital Stay (HOSPITAL_COMMUNITY): Payer: Medicare Other

## 2020-05-06 DIAGNOSIS — G8918 Other acute postprocedural pain: Secondary | ICD-10-CM

## 2020-05-06 DIAGNOSIS — K5901 Slow transit constipation: Secondary | ICD-10-CM

## 2020-05-06 LAB — GLUCOSE, CAPILLARY
Glucose-Capillary: 133 mg/dL — ABNORMAL HIGH (ref 70–99)
Glucose-Capillary: 140 mg/dL — ABNORMAL HIGH (ref 70–99)
Glucose-Capillary: 122 mg/dL — ABNORMAL HIGH (ref 70–99)
Glucose-Capillary: 140 mg/dL — ABNORMAL HIGH (ref 70–99)

## 2020-05-06 MED ORDER — SORBITOL 70 % SOLN
60.0000 mL | Status: AC
  Administered 2020-05-06: 60 mL via ORAL
  Filled 2020-05-06: qty 60

## 2020-05-06 MED ORDER — SIMETHICONE 80 MG PO CHEW
80.0000 mg | CHEWABLE_TABLET | Freq: Three times a day (TID) | ORAL | Status: DC
  Administered 2020-05-06 – 2020-05-09 (×9): 80 mg via ORAL
  Filled 2020-05-06 (×10): qty 1

## 2020-05-06 NOTE — Progress Notes (Signed)
Pt wore PRAFO boots through the night until 0430. She declined PRAFO boots after 0430.

## 2020-05-06 NOTE — Progress Notes (Signed)
[place] PHYSICAL MEDICINE & REHABILITATION PROGRESS NOTE  Subjective/Complaints: Complains of being nauseas and distended, constipated. At the same time pain is poorly controlled in the RLE  ROS: Patient denies fever, rash, sore throat, blurred vision, nausea, vomiting, diarrhea, cough, shortness of breath or chest pain,  headache, or mood change.    Objective: Vital Signs: Blood pressure (!) 113/50, pulse (!) 110, temperature 98.3 F (36.8 C), resp. rate 16, height 5\' 2"  (1.575 m), weight 69.6 kg, SpO2 91 %. DG Abd 1 View  Result Date: 05/06/2020 CLINICAL DATA:  Obstipation. EXAM: ABDOMEN - 1 VIEW COMPARISON:  05/02/2020 FINDINGS: Supine views of the abdomen were obtained. Again noted is surgical hardware lower lumbar spine. Gas-filled structure in the central abdomen probably related to the stomach but could possibly related to the transverse colon. Overall, nonobstructive bowel gas pattern. Elevation of the right hemidiaphragm. Evidence for rectal gas. IMPRESSION: Nonobstructive bowel gas pattern. Gas-filled structure in the upper central abdomen may be related to the stomach. Electronically Signed   By: Markus Daft M.D.   On: 05/06/2020 10:26   No results for input(s): WBC, HGB, HCT, PLT in the last 72 hours. No results for input(s): NA, K, CL, CO2, GLUCOSE, BUN, CREATININE, CALCIUM in the last 72 hours.  Intake/Output Summary (Last 24 hours) at 05/06/2020 1131 Last data filed at 05/06/2020 0955 Gross per 24 hour  Intake 618 ml  Output 2700 ml  Net -2082 ml        Physical Exam: BP (!) 113/50 (BP Location: Right Arm)   Pulse (!) 110   Temp 98.3 F (36.8 C)   Resp 16   Ht 5\' 2"  (1.575 m)   Wt 69.6 kg   SpO2 91%   BMI 28.06 kg/m  Constitutional: No distress . Vital signs reviewed. HEENT: EOMI, oral membranes moist Neck: supple Cardiovascular: RRR without murmur. No JVD    Respiratory/Chest: CTA Bilaterally without wheezes or rales. Normal effort    GI/Abdomen:  underactive BS, sl distended, belched a few times while I was in room Ext: no clubbing, cyanosis, or edema Psych: pleasant and cooperative GU: +Foley Skin: Warm and dry.  Neck incision with dressing CDI Psych: sl irritable Musc: Generalized tenderness ,? appears to be improving.  No LE edema. RLE tender with manipulation Neuro:  Alert and oriented. HOH Motor: B/l UE 5/5. B/l LE: 2+/5 HF, 3- KE and 4/5 ADF/PF, with bilateral pain inhibition  Assessment/Plan: 1. Functional deficits secondary to lumbar stenosis with neurogenic claudication s/p redo laminectomy with decompression b/l L3-5 which require 3+ hours per day of interdisciplinary therapy in a comprehensive inpatient rehab setting.  Physiatrist is providing close team supervision and 24 hour management of active medical problems listed below.  Physiatrist and rehab team continue to assess barriers to discharge/monitor patient progress toward functional and medical goals   Care Tool:  Bathing    Body parts bathed by patient: Right arm, Left arm, Chest, Abdomen, Face   Body parts bathed by helper: Front perineal area, Buttocks     Bathing assist Assist Level: Minimal Assistance - Patient > 75%     Upper Body Dressing/Undressing Upper body dressing   What is the patient wearing?: Pull over shirt, Orthosis    Upper body assist Assist Level: Minimal Assistance - Patient > 75%    Lower Body Dressing/Undressing Lower body dressing      What is the patient wearing?: Incontinence brief     Lower body assist Assist for lower body dressing:  Maximal Assistance - Patient 25 - 49%     Toileting Toileting    Toileting assist Assist for toileting: Total Assistance - Patient < 25%     Transfers Chair/bed transfer  Transfers assist     Chair/bed transfer assist level: Minimal Assistance - Patient > 75%     Locomotion Ambulation   Ambulation assist      Assist level: Contact Guard/Touching assist Assistive  device: Walker-rolling Max distance: 45ft   Walk 10 feet activity   Assist     Assist level: Contact Guard/Touching assist Assistive device: Walker-rolling   Walk 50 feet activity   Assist Walk 50 feet with 2 turns activity did not occur: Safety/medical concerns (fatigue, pain, generalized weakness)         Walk 150 feet activity   Assist Walk 150 feet activity did not occur: Safety/medical concerns (fatigue, pain, generalized weakness)         Walk 10 feet on uneven surface  activity   Assist Walk 10 feet on uneven surfaces activity did not occur: Safety/medical concerns (fatigue, pain, generalized weakness)         Wheelchair     Assist Will patient use wheelchair at discharge?: Yes Type of Wheelchair: Manual    Wheelchair assist level: Supervision/Verbal cueing Max wheelchair distance: 17ft    Wheelchair 50 feet with 2 turns activity    Assist        Assist Level: Moderate Assistance - Patient 50 - 74%   Wheelchair 150 feet activity     Assist      Assist Level: Total Assistance - Patient < 25%    Medical Problem List and Plan: 1.Functional and mobility deficitssecondary to lumbar stenosis with neurogenic claudication, s/p re-do laminectomy with decompression of bilateral L3,L4,L5 nerve roots on 04/24/20.  Continue CIR 2. Antithrombotics: -DVT/anticoagulation:Mechanical:Sequential compression devices, below kneeBilateral lower extremities -antiplatelet therapy: N/A 3. Pain Management:  Cont Gabapentin 300mg  daily qhs  Continue Oxycontin mg  15 BID  Cont tramadol 50 TID  Cont Percocet 5 q4 PRN  Lidocaine patch ordered  D/cedVicodin per report of confusion  Constipation likely contributing to pain as well  Lidoderm patch ordered for back  Will attempt to avoid increasing pain medication due to confusion, constipation, and nausea  Monitor with increased exertion  4. Mood:LCSW to follow for evaluation  and support. -antipsychotic agents: N/A 5. Neuropsych: This patientis not fullycapable of making decisions on hisown behalf. 6. Skin/Wound Care:Monitor wound for healing 7. Fluids/Electrolytes/Nutrition:Monitor I/Os. 8. T2DM with hyperglycemia: On glucotrol 10 mg am/5 mg pm--monitor for hypoglycemic episodes with rise in SCr. Will Monitor BS ac/hs and use SSI for elevated BS.  Relatively controlled on 10/30  Monitor with increased exertion 9.Acute on chronic anemia:   Continue iron supplement bid  Monitor for signs of bleeding.   Hb 7.4 on 10/27  Cont to monitor 10. CKD stage III: Baseline SCr-1.43-->1.54  Cr.  1.32 on 10/27 11. Flaccid neurogenic bladder. Patient with chronic Foley tubesince 2017 back surgery.DO NOT REMOVE FOLEY TUBE--gets foley change monthly at [facility]. 12.Chronic BLE edema/Hypertension.Monitor BP tid--continue Lasix 40 mg daily.   Blood pressure controlled on 10/30  Monitor with increased mobility 13. GERD: managed by protonix, changed timing per patient preference 14. Hyperlipidemia.Continue Lipitor 15. Chronic constipation: Added Miralax bid as colace ineffective.  KUB with constipation  KUB looks better from today 10/30 but there is still retained distal tract stool. "Gas bubble" to me appears to be in transverse colon although radiology seems  to think it's more likely stomach   -SSE today   -simethicone 16. Hyponatremia  Na 134 on 10/27, cont to monitor 17. Hypoalbuminemia  Supplement initiated on 10/27  LOS: 4 days A FACE TO [place] 05/06/2020, 11:31 AM

## 2020-05-06 NOTE — Progress Notes (Signed)
Physical Therapy Session Note  Patient Details  Name: [name] MRN: [id number] Date of Birth: Jan 15, 1930  Today's Date: 05/06/2020 PT Individual Time: 0900-0945 AND 1300-1345 PT Individual Time Calculation (min): 45 min AND 65 MIN  Short Term Goals: Week 1:  PT Short Term Goal 1 (Week 1): pt will perform bed mobility with min A consistantly PT Short Term Goal 2 (Week 1): pt will transfer bed<>WC with LRAD min A PT Short Term Goal 3 (Week 1): Pt will ambulate 37ft with LRAD CGA Week 2:    Week 3:     Skilled Therapeutic Interventions/Progress Updates:    AM SESSION PAIN Pt b/o back pain w/activity, rest breaks as needed, additional time provided w/all mobility. Therapist donned ted hose for time management Supine to sit w/rail, additional time, cues for spinal precautions/sequencing. Dons shoes w/mod assist. STS from slightly elevated bed w/cga. Gait 32ft wto wc w/cga, cues for posture, turn/sit w/cga and cues for safety. Pt began to vomit profusely in sitting, provided w/vomit bag, wash cloth, supervision.     Transported to gym for therex: saeted LAQx w/3lb wt 2 x12 for general strengthening.  Transported to room Pt left oob in wc w/NT in room.  NT agreed to set alarm belt/unable to locate and also to provide needs in reach for pt. Pt handed off to NT.    PM SESSION PAIN  9/10 low back when asked, but only complaining of nausea otherwise.  Rest breaks provided, addisitonal back support using pillow in wc provided.  Pt oob in wc, did not eat lunch due to nausea.  Wants to participate despite nausea. Pt transported to day room for session, pt requests wc be moved slowly due to nausea.  Attempted kinetron for LE strengthening/limited movement due to nausea, but pt c/o increased low back pain w/this activity. STS from wc w/cga to RW.  Stood x 45 sec w/cga.   Gait 60ft w/RW and cga, wc follow due to nausea, cues for upright posture, slow cadence.  Pt able to complete  this distance without vomiting.  Rested in sitting x 5 min due tonausea.  Gait 42ft w/RW and cga, wc follow due to nausea, cues as above.  Pt again able to complete without vomiting but c/o nausea as limiting factor.   wc propulsion x 55ft w/bilat UEs for general strengthening activity.  Pt transported back to room.  Therapist offered to heat lunch, but pt stated she needed time before eating due to nausea. Pt left oob in wc w/alarm belt set and needs in reach  Therapy Documentation Precautions:  Precautions Precautions: Fall, Back Required Braces or Orthoses: Spinal Brace Spinal Brace: Lumbar corset, Applied in sitting position Restrictions Weight Bearing Restrictions: No    Therapy/Group: Individual Therapy  Callie Fielding, [place] 05/06/2020, 4:02 PM

## 2020-05-06 NOTE — Progress Notes (Signed)
Occupational Therapy Session Note  Patient Details  Name: [name] MRN: [id number] Date of Birth: Jun 15, 1930  Today's Date: 05/06/2020 OT Individual Time: 1015-1045 OT Individual Time Calculation (min): 30 min    Short Term Goals: Week 1:  OT Short Term Goal 1 (Week 1): Pt will don pants with min assist with AE as needed OT Short Term Goal 2 (Week 1): Pt will complete sit > stand as needed for toileting and LB dressing with min assist OT Short Term Goal 3 (Week 1): Pt will complete bathing with min assist with AE as needed OT Short Term Goal 4 (Week 1): Pt will complete toilet transfer with min assist, ambulating with RW  Skilled Therapeutic Interventions/Progress Updates:    First session: Pt sitting up in w/c, reports feeling nauseous like she might throw up and woozy. BP assessed: 119/55 pulse 97. Pt agreeable to light self care sitting sinkside as tolerated.  Pt transported via w/c to sinkside.  Pt completed oral hygiene and washed face with setup.  Pt doffed lumbar corset and shirt overhead with min assist.  Pt bathed UB with min assist for back.  Pt reports she has a long handled sponge at home and OT educated pt on benefits to increase independence while following back precautions.  Pt donned shirt and lumbar corset with min assist.  Pt had episode of emesis of medium amount sitting up in w/c. Nurse made aware.  Call bell in reach, seat belt alarm on.    Second Session:  Pt sitting up in w/c, reports feeling queasy and woozy but a little better than earlier. Dtr present for session. OT session with focus on LB self care sitting/standing sinkside.  Pt transported to sink via w/c.  Pt doffed pants and brief with CGA for standing and pt pulled pants down only to ankles without doffing all the way.  Pt bathed LB with min assist for bilateral feet.  Pt utilized periwand to bath buttocks after OT provided education on technique of use.  Pt required total assist to doff/donn TED Hose per  back precautions.  Pt donned brief and pants with min assist for tab mgt on brief and CGA during standing when pulling over hips.  Pt returned to seated in w/c at bedside, call bell in reach, seat belt alarm on.    Therapy Documentation Precautions:  Precautions Precautions: Fall, Back Required Braces or Orthoses: Spinal Brace Spinal Brace: Lumbar corset, Applied in sitting position Restrictions Weight Bearing Restrictions: No   Therapy/Group: Individual Therapy  Ezekiel Slocumb 05/06/2020, 4:36 PM

## 2020-05-07 LAB — GLUCOSE, CAPILLARY
Glucose-Capillary: 128 mg/dL — ABNORMAL HIGH (ref 70–99)
Glucose-Capillary: 87 mg/dL (ref 70–99)
Glucose-Capillary: 115 mg/dL — ABNORMAL HIGH (ref 70–99)
Glucose-Capillary: 121 mg/dL — ABNORMAL HIGH (ref 70–99)

## 2020-05-07 NOTE — Progress Notes (Signed)
[place] PHYSICAL MEDICINE & REHABILITATION PROGRESS NOTE  Subjective/Complaints: Feeling better today. Had results with enema yesterday. Feels that she still could go more. Appetite is back. We reviewed results of her KUB. C/o spot/knot on left neck which is sore.  ROS: Patient denies fever, rash, sore throat, blurred vision, nausea, vomiting, diarrhea, cough, shortness of breath or chest pain,  headache, or mood change.     Objective: Vital Signs: Blood pressure 137/62, pulse (!) 102, temperature 98.2 F (36.8 C), resp. rate 18, height 5\' 2"  (1.575 m), weight 69.6 kg, SpO2 96 %. DG Abd 1 View  Result Date: 05/06/2020 CLINICAL DATA:  Obstipation. EXAM: ABDOMEN - 1 VIEW COMPARISON:  05/02/2020 FINDINGS: Supine views of the abdomen were obtained. Again noted is surgical hardware lower lumbar spine. Gas-filled structure in the central abdomen probably related to the stomach but could possibly related to the transverse colon. Overall, nonobstructive bowel gas pattern. Elevation of the right hemidiaphragm. Evidence for rectal gas. IMPRESSION: Nonobstructive bowel gas pattern. Gas-filled structure in the upper central abdomen may be related to the stomach. Electronically Signed   By: Markus Daft M.D.   On: 05/06/2020 10:26   No results for input(s): WBC, HGB, HCT, PLT in the last 72 hours. No results for input(s): NA, K, CL, CO2, GLUCOSE, BUN, CREATININE, CALCIUM in the last 72 hours.  Intake/Output Summary (Last 24 hours) at 05/07/2020 1035 Last data filed at 05/07/2020 1004 Gross per 24 hour  Intake 236 ml  Output --  Net 236 ml        Physical Exam: BP 137/62 (BP Location: Right Arm)   Pulse (!) 102   Temp 98.2 F (36.8 C)   Resp 18   Ht 5\' 2"  (1.575 m)   Wt 69.6 kg   SpO2 96%   BMI 28.06 kg/m  Constitutional: No distress . Vital signs reviewed. HEENT: EOMI, oral membranes moist Neck: supple. Some tenderness anterior to left SCM, ?lymph node Cardiovascular: RRR without  murmur. No JVD    Respiratory/Chest: CTA Bilaterally without wheezes or rales. Normal effort    GI/Abdomen: BS +, non-tender, non-distended Ext: no clubbing, cyanosis, or edema Psych: pleasant and cooperative GU: +Foley Skin: Warm and dry.  Neck incision with dressing CDI Musc: Generalized tenderness ,? appears to be improving.  No LE edema. RLE tender with manipulation Neuro:  Alert and oriented. HOH Motor: B/l UE 5/5. B/l LE: 2+/5 HF, 3- KE and 4/5 ADF/PF, with bilateral pain inhibition ongoing  Assessment/Plan: 1. Functional deficits secondary to lumbar stenosis with neurogenic claudication s/p redo laminectomy with decompression b/l L3-5 which require 3+ hours per day of interdisciplinary therapy in a comprehensive inpatient rehab setting.  Physiatrist is providing close team supervision and 24 hour management of active medical problems listed below.  Physiatrist and rehab team continue to assess barriers to discharge/monitor patient progress toward functional and medical goals   Care Tool:  Bathing    Body parts bathed by patient: Right arm, Left arm, Chest, Abdomen, Face, Front perineal area, Buttocks, Right upper leg, Left upper leg   Body parts bathed by helper: Left lower leg, Right lower leg     Bathing assist Assist Level: Minimal Assistance - Patient > 75%     Upper Body Dressing/Undressing Upper body dressing   What is the patient wearing?: Pull over shirt, Orthosis    Upper body assist Assist Level: Minimal Assistance - Patient > 75%    Lower Body Dressing/Undressing Lower body dressing  What is the patient wearing?: Incontinence brief, Pants     Lower body assist Assist for lower body dressing: Moderate Assistance - Patient 50 - 74%     Toileting Toileting    Toileting assist Assist for toileting: Total Assistance - Patient < 25%     Transfers Chair/bed transfer  Transfers assist     Chair/bed transfer assist level: Minimal Assistance -  Patient > 75%     Locomotion Ambulation   Ambulation assist      Assist level: Contact Guard/Touching assist Assistive device: Walker-rolling Max distance: 58ft   Walk 10 feet activity   Assist     Assist level: Contact Guard/Touching assist Assistive device: Walker-rolling   Walk 50 feet activity   Assist Walk 50 feet with 2 turns activity did not occur: Safety/medical concerns (fatigue, pain, generalized weakness)         Walk 150 feet activity   Assist Walk 150 feet activity did not occur: Safety/medical concerns (fatigue, pain, generalized weakness)         Walk 10 feet on uneven surface  activity   Assist Walk 10 feet on uneven surfaces activity did not occur: Safety/medical concerns (fatigue, pain, generalized weakness)         Wheelchair     Assist Will patient use wheelchair at discharge?: Yes Type of Wheelchair: Manual    Wheelchair assist level: Supervision/Verbal cueing Max wheelchair distance: 17ft    Wheelchair 50 feet with 2 turns activity    Assist        Assist Level: Moderate Assistance - Patient 50 - 74%   Wheelchair 150 feet activity     Assist      Assist Level: Total Assistance - Patient < 25%    Medical Problem List and Plan: 1.Functional and mobility deficitssecondary to lumbar stenosis with neurogenic claudication, s/p re-do laminectomy with decompression of bilateral L3,L4,L5 nerve roots on 04/24/20.  Continue CIR 2. Antithrombotics: -DVT/anticoagulation:Mechanical:Sequential compression devices, below kneeBilateral lower extremities -antiplatelet therapy: N/A 3. Pain Management:  Cont Gabapentin 300mg  daily qhs  Continue Oxycontin mg  15 BID  Cont tramadol 50 TID  Cont Percocet 5 q4 PRN  Lidocaine patch ordered  D/cedVicodin per report of confusion   Lidoderm patch ordered for back  Pain seems better after relief of constipation/nausea     4. Mood:LCSW to follow  for evaluation and support. -antipsychotic agents: N/A 5. Neuropsych: This patientis not fullycapable of making decisions on hisown behalf. 6. Skin/Wound Care:Monitor wound for healing 7. Fluids/Electrolytes/Nutrition:Monitor I/Os. 8. T2DM with hyperglycemia: On glucotrol 10 mg am/5 mg pm--monitor for hypoglycemic episodes with rise in SCr. Will Monitor BS ac/hs and use SSI for elevated BS.  Relatively controlled on 10/31  Monitor with increased exertion 9.Acute on chronic anemia:   Continue iron supplement bid  Monitor for signs of bleeding.   Hb 7.4 on 10/27  Cont to monitor 10. CKD stage III: Baseline SCr-1.43-->1.54  Cr.  1.32 on 10/27 11. Flaccid neurogenic bladder. Patient with chronic Foley tubesince 2017 back surgery.DO NOT REMOVE FOLEY TUBE--gets foley change monthly at [facility]. 12.Chronic BLE edema/Hypertension.Monitor BP tid--continue Lasix 40 mg daily.   Blood pressure controlled on 10/31  Monitor with increased mobility 13. GERD: managed by protonix, changed timing per patient preference 14. Hyperlipidemia.Continue Lipitor 15. Chronic constipation: Added Miralax bid as colace ineffective.  KUB with constipation  10/30 KUB looks better from today 10/30 but there is still retained distal tract stool. "Gas bubble" to me appears to be  in transverse colon although radiology seems to think it's more likely stomach   -SSE with good results   -simethicone  10/31 pt feeling better. Still feels that she needs to empty  More   -will repeat SSE later today per her request   -continue to push fluids, OOB/activity 16. Hyponatremia  Na 134 on 10/27, cont to monitor 17. Hypoalbuminemia  Supplement initiated on 10/27  LOS: 5 days A FACE TO [place] 05/07/2020, 10:35 AM

## 2020-05-08 ENCOUNTER — Inpatient Hospital Stay (HOSPITAL_COMMUNITY): Payer: Medicare Other | Admitting: Occupational Therapy

## 2020-05-08 ENCOUNTER — Inpatient Hospital Stay (HOSPITAL_COMMUNITY): Payer: Medicare Other

## 2020-05-08 ENCOUNTER — Encounter (HOSPITAL_COMMUNITY): Payer: Medicare Other | Admitting: Psychology

## 2020-05-08 DIAGNOSIS — Z8679 Personal history of other diseases of the circulatory system: Secondary | ICD-10-CM

## 2020-05-08 LAB — CBC
HCT: 27.4 % — ABNORMAL LOW (ref 36.0–46.0)
Hemoglobin: 8.4 g/dL — ABNORMAL LOW (ref 12.0–15.0)
MCH: 28.6 pg (ref 26.0–34.0)
MCHC: 30.7 g/dL (ref 30.0–36.0)
MCV: 93.2 fL (ref 80.0–100.0)
Platelets: 599 10*3/uL — ABNORMAL HIGH (ref 150–400)
RBC: 2.94 MIL/uL — ABNORMAL LOW (ref 3.87–5.11)
RDW: 15.4 % (ref 11.5–15.5)
WBC: 5.6 10*3/uL (ref 4.0–10.5)
nRBC: 0 % (ref 0.0–0.2)

## 2020-05-08 LAB — BASIC METABOLIC PANEL
Anion gap: 11 (ref 5–15)
BUN: 16 mg/dL (ref 8–23)
CO2: 30 mmol/L (ref 22–32)
Calcium: 8.9 mg/dL (ref 8.9–10.3)
Chloride: 91 mmol/L — ABNORMAL LOW (ref 98–111)
Creatinine, Ser: 1.36 mg/dL — ABNORMAL HIGH (ref 0.44–1.00)
GFR, Estimated: 37 mL/min — ABNORMAL LOW (ref >60)
Glucose, Bld: 146 mg/dL — ABNORMAL HIGH (ref 70–99)
Potassium: 3.7 mmol/L (ref 3.5–5.1)
Sodium: 132 mmol/L — ABNORMAL LOW (ref 135–145)

## 2020-05-08 LAB — GLUCOSE, CAPILLARY
Glucose-Capillary: 123 mg/dL — ABNORMAL HIGH (ref 70–99)
Glucose-Capillary: 126 mg/dL — ABNORMAL HIGH (ref 70–99)
Glucose-Capillary: 93 mg/dL (ref 70–99)
Glucose-Capillary: 203 mg/dL — ABNORMAL HIGH (ref 70–99)

## 2020-05-08 MED ORDER — NALOXEGOL OXALATE 12.5 MG PO TABS
12.5000 mg | ORAL_TABLET | Freq: Every day | ORAL | Status: DC
  Administered 2020-05-08 – 2020-05-11 (×3): 12.5 mg via ORAL
  Filled 2020-05-08 (×4): qty 1

## 2020-05-08 NOTE — Progress Notes (Signed)
Physical Therapy Session Note  Patient Details  Name: [name] MRN: [id number] Date of Birth: 1930-03-30  Today's Date: 05/08/2020 PT Individual Time: 5027-7412 and 8786-7672 PT Individual Time Calculation (min): 58 min and 69 min  Short Term Goals: Week 1:  PT Short Term Goal 1 (Week 1): pt will perform bed mobility with min A consistantly PT Short Term Goal 2 (Week 1): pt will transfer bed<>WC with LRAD min A PT Short Term Goal 3 (Week 1): Pt will ambulate 32ft with LRAD CGA  Skilled Therapeutic Interventions/Progress Updates:   Treatment Session 1: 0947-0962 58 min Received pt supine in bed, pt agreeable to therapy, and reported pain 7/10 in low back. RN notified and present to administer medications. Repositioning, rest breaks, and distraction done to reduce pain levels. Session with emphasis on functional mobility/transfers, generalized strengthening, dynamic standing balance/coordination, ambulation, toileting, and improved activity tolerance. Donned ted hose in supine with total A and pants with max A to thread catheter through pants. Pt rolled L and R with CGA/supervision using bedrails and required max A to pull pants over hips. Pt transferred supine<>sitting EOB from flat bed with use of bedrails and min A with cues for logroll technique. Donned LSO and shoes total A and pt transferred bed<>WC stand<>pivot with RW and CGA. Pt transported to dayroom in [facility] total A for time management purposes and transferred sit<>stand with RW and CGA and began to ambulate. Pt then stated "I'm pooping" but insisted on walking to room despite therapist's recommendations to return to room in [facility]. Pt ambulated 168ft with RW and CGA back to room and transferred onto toilet with bedside commode over top with CGA. Pt with dark loose BM running down leg and soiling pants and ted hose. Doffed dirty brief and clothing and cleaned pt with total A. Pt transferred sit<>stand with RW and CGA and required total A for  peri-care; noted pt with continued loose BM and returned to sitting. Concluded session with pt sitting on commode, needs within reach, and pt agreeable to pull alarm cord when finished. RN made aware of pt's current status.   Treatment Session 2: 8366-2947 69 min Received pt sitting in recliner finishing lunch, pt agreeable to therapy, and reported pain 7/10 in low back (premedicated). Repositioning, rest breaks, and distraction done to reduce pain levels. Session with emphasis on functional mobility/transfers, generalized strengthening, dynamic standing balance/coordination, ambulation, stair navigation, toileting, and improved activity tolerance. Pt transferred sit<>stand with RW and CGA and ambulated >152ft using RW and CGA from room to therapy gym with increased time and encouragement. Pt ambulated 58ft with RW and CGA to staircase and navigated 4 steps with 2 rails and min/mod A ascending and descending with a step to pattern. Pt reported feeling like she may have had a BM but not totally sure. Pt requested to continue but therapist encouraged returning to room to check based off pt's loose bowels from this morning. Returned to room and pt ambulated additional 23ft x 2 trials to/from bathroom with RW and CGA. Doffed pants and brief with total and noted pt with dark loose BM smear in brief and with continued loose BM in toilet. Pt required increased time toileting attempting to pass hardened stool. Donned clean brief total A and pt transferred sit<>stand with RW CGA and required mod A to pull pants over hips and total A for peri-care. Pt transferred sit<>stand without AD and CGA at sink and performed the following exercises standing with BUE support and CGA: -alternating  marches 2x12 -hip abduction 2x10 bilaterally  -heel raises 2x10 Pt suddenly reported feeling nauseous and with multiple episodes of emesis. RN present to take measurements and administer medications; updated RN on pt's current status.  Concluded session with pt sitting in [facility] with RN present attending to care.   Therapy Documentation Precautions:  Precautions Precautions: Fall, Back Required Braces or Orthoses: Spinal Brace Spinal Brace: Lumbar corset, Applied in sitting position Restrictions Weight Bearing Restrictions: No   Therapy/Group: Individual Therapy Alfonse Alpers PT, DPT   05/08/2020, 7:22 AM

## 2020-05-08 NOTE — Consult Note (Signed)
Neuropsychological Consultation   Patient:   [name]   DOB:   12-24-1929  MR Number:  [id number]  Location:  [place] [id number] [place] [address] Dept: [place]: [phone]           Date of Service:   05/08/2020  Start Time:   10 AM End Time:   10:45 AM  Provider/Observer:  Ilean Skill, Psy.D.       Clinical Neuropsychologist       Billing Code/Service: [phone]  Chief Complaint:    Keyleigh Manninen is an 86 year old female with history of type 2 diabetes, neurogenic bladder, OA, CKD, degenerative disc disease with back pain and left extremity pain with neurogenic claudication due to L2-L5 stenosis with spondylolisthesis.  Patient admitted on 04/24/2020 for redo L2/3, L3/4 and L4/5 laminotomy, laminectomy with decompression of L3-L4 and L5 nerve roots by Dr. Arnoldo Morale.  Postoperative changes included issues of generalized weakness with hypotension, somnolence, acute on chronic renal failure.  Postoperative course includes continued limitations due to ongoing pain radiating left lower extremity, poor standing balance as well as difficulty recalling precautions.  Reason for Service:  Patient was referred for neuropsychological consultation due to issues associated with recalling safety protocols post surgery.  Below is the HPI for the current admission.  [id number] B. Noble is an 86 year old female with history of T2DM, neurogenic bladder, OA, CKD, DDD with back pain and LE pain with neurogenic claudication due to L2-L5 stenosis with spondylolisthesis and was admitted on 04/24/20 for redo L2/3, L3/4 and L4/5 laminotomy, laminectomy with decompression of L3, L4 and L5 nerve roots by Dr. Arnoldo Morale. Post op course significant for issues with generalized weakness with hypotension, somnolence, acute on chronic renal failure with ABLA. Narcotics being titrated upwards for better pain  control. She continues to have limitations due to ongoing pain radiating LLE, poor standing balance as well as difficulty recalling precautions.  Has been limiteddue to back pain since June admission with limited mobility. Sponge bathes. Neighbors/Daughter has been assisting with meals/home since then.CIR recommended due to functional deficits. Is okay  Current Status:  Upon entering the room, the patient was sitting in the reclining chair in the upright position.  She was alert and oriented and had reviewed her schedule and was aware I was coming and introduced her self asking about how to pronounce my last name.  She was oriented x4 and showed good recall of recent events.  Was alert, complained of pain but mostly about current constipation, which she reports has been improving.  Improved safety awareness and patient has now improved memory and awareness.    Behavioral Observation: TRINKA KESHISHYAN  presents as a 86 y.o.-year-old Right Caucasian Female who appeared her stated age. her dress was Appropriate and she was Well Groomed and her manners were Appropriate to the situation.  her participation was indicative of Appropriate and Redirectable behaviors.  There were any physical disabilities noted.  she displayed an appropriate level of cooperation and motivation.     Interactions:    Active Appropriate and Redirectable  Attention:   within normal limits and attention span and concentration were age appropriate  Memory:   within normal limits; recent and remote memory intact  Visuo-spatial:  within normal limits  Speech (Volume):  normal  Speech:   normal; normal  Thought Process:  Coherent and Relevant  Though Content:  WNL; not suicidal and not  homicidal  Orientation:   person, place, time/date and situation  Judgment:   Fair  Planning:   Fair  Affect:    Appropriate  Mood:    Dysphoric  Insight:   Good  Intelligence:   normal  Medical History:   Past Medical  History:  Diagnosis Date  . Anemia   . Arthritis   . Bilateral edema of lower extremity   . 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 Chronic lower back pain   . CKD (chronic kidney disease) stage 3, GFR 30-59 ml/min (HCC)   . DDD (degenerative disc disease)    lumbar  . Diverticulosis   . Family history of breast cancer   . Family history of pancreatic cancer   . Family history of prostate cancer   . Flaccid neuropathic bladder, not elsewhere classified   . Foley catheter in place   . GERD (gastroesophageal reflux disease)   . Hemorrhoids   . Hiatal hernia   . History of colon polyps   . History of radiation therapy 09-12-2015 to 10-12-2015   42.72 gray in 16 fractions directed right breast w/ boost of 12 gray in 6 fractions directed at the lumpectomy cavity- Total dose: 54.72y  . History of recurrent UTIs   . Hyperlipidemia   . PONV (postoperative nausea and vomiting)   . RBBB (right bundle branch block with left anterior fascicular block)   . Type 2 diabetes mellitus ([place])   . Urinary retention with incomplete bladder emptying   . Wears dentures    full upper and lower partial  . Wears glasses   . Wears hearing aid    bilateral but does not wear   Psychiatric History:  No prior psychiatric history  Family Med/Psych History:  Family History  Problem Relation Age of Onset  . Pancreatic cancer Mother 2  . Diabetes Mother   . Prostate cancer Brother   . Diabetes Father   . Heart disease Father   . Diabetes Brother   . Heart attack Brother   . Diabetes Sister   . Diabetes Daughter   . Diabetes Son   . Coronary artery disease Brother        MI in his 87s  . Colon cancer Neg Hx   . Stomach cancer Neg Hx    Impression/DX:  Helen Winterhalter is an 86 year old  female with history of type 2 diabetes, neurogenic bladder, OA, CKD, degenerative disc disease with back pain and left extremity pain with neurogenic claudication due to L2-L5 stenosis with spondylolisthesis.  Patient admitted on 04/24/2020 for redo L2/3, L3/4 and L4/5 laminotomy, laminectomy with decompression of L3-L4 and L5 nerve roots by Dr. Arnoldo Morale.  Postoperative changes included issues of generalized weakness with hypotension, somnolence, acute on chronic renal failure.  Postoperative course includes continued limitations due to ongoing pain radiating left lower extremity, poor standing balance as well as difficulty recalling precautions.  Upon entering the room, the patient was sitting in the reclining chair in the upright position.  She was alert and oriented and had reviewed her schedule and was aware I was coming and introduced her self asking about how to pronounce my last name.  She was oriented x4 and showed good recall of recent events.  Was alert, complained of pain but  mostly about current constipation, which she reports has been improving.  Improved safety awareness and patient has now improved memory and awareness.    Disposition/Plan:  The patient appears to improved cognitive functioning and was alert and oriented today.  She was able to describe concerns around safety issues post back surgery.  She was able to go through limitations that she was aware of and need for following precautions but she is actively looking forward to being able to get home at a state where she is able to take care of herself independently as her husband is not capable of doing a lot to facilitate her.  He has upcoming skin cancer surgery.         Electronically Signed   _______________________ Ilean Skill, Psy.D.

## 2020-05-08 NOTE — Progress Notes (Signed)
Occupational Therapy Session Note  Patient Details  Name: [name] MRN: [id number] Date of Birth: [birth year]  Today's Date: 05/08/2020 OT Individual Time: 1105-1205 OT Individual Time Calculation (min): 60 min    Short Term Goals: Week 1:  OT Short Term Goal 1 (Week 1): Pt will don pants with min assist with AE as needed OT Short Term Goal 2 (Week 1): Pt will complete sit > stand as needed for toileting and LB dressing with min assist OT Short Term Goal 3 (Week 1): Pt will complete bathing with min assist with AE as needed OT Short Term Goal 4 (Week 1): Pt will complete toilet transfer with min assist, ambulating with RW  Skilled Therapeutic Interventions/Progress Updates:    Treatment session with focus on functional mobility, activity tolerance, and endurance.  Pt received upright in recliner on the phone with her husband.  Pt tearful as she had not heard from him earlier in the day and was worried about him.  Pt agreeable to therapy session.  Pt ambulated functional distances in room (15-20') with RW recliner to w/c.  Engaged in dynamic standing balance at high-low table while focusing on increased endurance during table top tasks.  Engaged in [place] 4 in standing with focus on increased standing tolerance and endurance.  Educated on functional carryover to IADLs.  Discussed energy conservation strategies with IADLs of meal prep and laundry tasks with pt reporting understanding.  Engaged in functional mobility in ADL apt with pt completing furniture transfers to/from recliner with min assist to stand due to low surface.  Ambulated in kitchen with RW with CGA, reaching in to cabinets and refrigerator to simulate aspects of meal prep.  Pt returned to room and ambulated back to recliner with RW with CGA.  Pt remained upright in recliner with all needs in reach.  Therapy Documentation Precautions:  Precautions Precautions: Fall, Back Required Braces or Orthoses: Spinal Brace Spinal  Brace: Lumbar corset, Applied in sitting position Restrictions Weight Bearing Restrictions: No Pain:  Pt with c/o pain in back at surgical site.  Premedicated.   Therapy/Group: Individual Therapy  Simonne Come 05/08/2020, 3:13 PM

## 2020-05-08 NOTE — Progress Notes (Signed)
[place] PHYSICAL MEDICINE & REHABILITATION PROGRESS NOTE  Subjective/Complaints: Patient seen sitting up in bed this morning.  She states she slept well overnight.  She states she had a good weekend.  She states her pain is the same, with no improvement.  She does note that she had a bowel movement.  ROS: + Generalized pain. Denies CP, SOB, N/V/D  Objective: Vital Signs: Blood pressure (!) 106/58, pulse 97, temperature 97.7 F (36.5 C), resp. rate 18, height 5\' 2"  (1.575 m), weight 69.6 kg, SpO2 96 %. No results found. Recent Labs    05/08/20 0821  WBC 5.6  HGB 8.4*  HCT 27.4*  PLT 599*   Recent Labs    05/08/20 0821  NA 132*  K 3.7  CL 91*  CO2 30  GLUCOSE 146*  BUN 16  CREATININE 1.36*  CALCIUM 8.9    Intake/Output Summary (Last 24 hours) at 05/08/2020 1051 Last data filed at 05/08/2020 0853 Gross per 24 hour  Intake 120 ml  Output 2600 ml  Net -2480 ml        Physical Exam: BP (!) 106/58 (BP Location: Right Arm)   Pulse 97   Temp 97.7 F (36.5 C)   Resp 18   Ht 5\' 2"  (1.575 m)   Wt 69.6 kg   SpO2 96%   BMI 28.06 kg/m  Constitutional: No distress . Vital signs reviewed. HENT: Normocephalic.  Atraumatic. Eyes: EOMI. No discharge. Cardiovascular: No JVD.   Respiratory: Normal effort.  No stridor.   GI: Non-distended.   Skin: Warm and dry.  Back incision CDI Psych: Normal mood.  Normal behavior. Musc: No edema in extremities.  Generalized tenderness improving. GU: + Foley Neuro:  Alert and oriented. HOH Motor: B/l UE 5/5. B/l LE: 3/5 HF, 3-/5 KE and 4/5 ADF/PF, with bilateral pain inhibition ongoing  Assessment/Plan: 1. Functional deficits secondary to lumbar stenosis with neurogenic claudication s/p redo laminectomy with decompression b/l L3-5 which require 3+ hours per day of interdisciplinary therapy in a comprehensive inpatient rehab setting.  Physiatrist is providing close team supervision and 24 hour management of active medical problems  listed below.  Physiatrist and rehab team continue to assess barriers to discharge/monitor patient progress toward functional and medical goals   Care Tool:  Bathing    Body parts bathed by patient: Right arm, Left arm, Chest, Abdomen, Face, Front perineal area, Buttocks, Right upper leg, Left upper leg   Body parts bathed by helper: Left lower leg, Right lower leg     Bathing assist Assist Level: Minimal Assistance - Patient > 75%     Upper Body Dressing/Undressing Upper body dressing   What is the patient wearing?: Pull over shirt, Orthosis    Upper body assist Assist Level: Minimal Assistance - Patient > 75%    Lower Body Dressing/Undressing Lower body dressing      What is the patient wearing?: Incontinence brief, Pants     Lower body assist Assist for lower body dressing: Moderate Assistance - Patient 50 - 74%     Toileting Toileting    Toileting assist Assist for toileting: Total Assistance - Patient < 25%     Transfers Chair/bed transfer  Transfers assist     Chair/bed transfer assist level: Contact Guard/Touching assist     Locomotion Ambulation   Ambulation assist      Assist level: Contact Guard/Touching assist Assistive device: Walker-rolling Max distance: 121ft   Walk 10 feet activity   Assist     Assist  level: Contact Guard/Touching assist Assistive device: Walker-rolling   Walk 50 feet activity   Assist Walk 50 feet with 2 turns activity did not occur: Safety/medical concerns (fatigue, pain, generalized weakness)  Assist level: Contact Guard/Touching assist Assistive device: Walker-rolling    Walk 150 feet activity   Assist Walk 150 feet activity did not occur: Safety/medical concerns (fatigue, pain, generalized weakness)  Assist level: Contact Guard/Touching assist Assistive device: Walker-rolling    Walk 10 feet on uneven surface  activity   Assist Walk 10 feet on uneven surfaces activity did not occur:  Safety/medical concerns (fatigue, pain, generalized weakness)         Wheelchair     Assist Will patient use wheelchair at discharge?: Yes Type of Wheelchair: Manual    Wheelchair assist level: Supervision/Verbal cueing Max wheelchair distance: 42ft    Wheelchair 50 feet with 2 turns activity    Assist        Assist Level: Moderate Assistance - Patient 50 - 74%   Wheelchair 150 feet activity     Assist      Assist Level: Total Assistance - Patient < 25%    Medical Problem List and Plan: 1.Functional and mobility deficitssecondary to lumbar stenosis with neurogenic claudication, s/p re-do laminectomy with decompression of bilateral L3,L4,L5 nerve roots on 04/24/20.  Continue CIR 2. Antithrombotics: -DVT/anticoagulation:Mechanical:Sequential compression devices, below kneeBilateral lower extremities -antiplatelet therapy: N/A 3. Pain Management:  Cont Gabapentin 300mg  daily qhs  Continue Oxycontin mg  15 BID, plan to wean  Cont tramadol 50 TID  Cont Percocet 5 q4 PRN  Lidocaine patch ordered  D/cedVicodin per report of confusion  Lidoderm patch ordered for back  Behavioral component as well  Controlled when not being asked on 11/1    4. Mood:LCSW to follow for evaluation and support. -antipsychotic agents: N/A 5. Neuropsych: This patientis not fullycapable of making decisions on hisown behalf. 6. Skin/Wound Care:Monitor wound for healing 7. Fluids/Electrolytes/Nutrition:Monitor I/Os. 8. T2DM with hyperglycemia: On glucotrol 10 mg am/5 mg pm--monitor for hypoglycemic episodes with rise in SCr. Will Monitor BS ac/hs and use SSI for elevated BS.  Slightly elevated on 11/1  Monitor with increased exertion 9.Acute on chronic anemia:   Continue iron supplement bid  Monitor for signs of bleeding.   Hb 8.4 on 11/1  Cont to monitor 10. CKD stage III: Baseline SCr-1.43-->1.54  Cr.  1.36 on 11/1 11. Flaccid  neurogenic bladder. Patient with chronic Foley tubesince 2017 back surgery.DO NOT REMOVE FOLEY TUBE--gets foley change monthly at [facility]. 12.Chronic BLE edema/Hypertension.Monitor BP tid--continue Lasix 40 mg daily.   Blood pressure relatively controlled/soft on 11/1  Monitor with increased mobility 13. GERD: managed by protonix, changed timing per patient preference 14. Hyperlipidemia.Continue Lipitor 15. Chronic constipation: Added Miralax bid as colace ineffective.  KUB with constipation  Movantik ordered on 11/1 16. Hyponatremia  Na 132 on 1/1 17. Hypoalbuminemia  Supplement initiated on 10/27  LOS: 6 days A FACE TO FACE EVALUATION WAS PERFORMED  Faydra Korman Lorie Phenix 05/08/2020, 10:51 AM

## 2020-05-09 ENCOUNTER — Inpatient Hospital Stay (HOSPITAL_COMMUNITY): Payer: Medicare Other

## 2020-05-09 ENCOUNTER — Inpatient Hospital Stay (HOSPITAL_COMMUNITY): Payer: Medicare Other | Admitting: Occupational Therapy

## 2020-05-09 LAB — GLUCOSE, CAPILLARY
Glucose-Capillary: 121 mg/dL — ABNORMAL HIGH (ref 70–99)
Glucose-Capillary: 93 mg/dL (ref 70–99)
Glucose-Capillary: 141 mg/dL — ABNORMAL HIGH (ref 70–99)
Glucose-Capillary: 140 mg/dL — ABNORMAL HIGH (ref 70–99)

## 2020-05-09 MED ORDER — POLYETHYLENE GLYCOL 3350 17 G PO PACK: 17.0000 g | PACK | Freq: Every day | ORAL | Status: DC | PRN

## 2020-05-09 NOTE — Progress Notes (Addendum)
Physical Therapy Session Note  Patient Details  Name: [name] MRN: [id number] Date of Birth: 1929-12-21  Today's Date: 05/09/2020 PT Individual Time: 0900-0940 and 1300-1411 PT Individual Time Calculation (min): 40 min and 71 min  Short Term Goals: Week 1:  PT Short Term Goal 1 (Week 1): pt will perform bed mobility with min A consistantly PT Short Term Goal 2 (Week 1): pt will transfer bed<>WC with LRAD min A PT Short Term Goal 3 (Week 1): Pt will ambulate 83ft with LRAD CGA  Skilled Therapeutic Interventions/Progress Updates:   Treatment Session 1: 2446-2863 40 min Received pt supine in bed, pt agreeable to therapy, and reported pain 7/10 in low back (premedicated). Repositioning, rest breaks, and distraction done to reduce pain levels. Session with emphasis on dressing, functional mobility/transfers, generalized strengthening, dynamic standing balance/coordination, NMR, ambulation, and improved activity tolerance. Donned bilateral ted hose total A and pants with max A and pt rolled L and R with supervision/CGA and use of bedrails and required max A to pull pants over hips. Donned shoes total A and pt transferred supine<>sitting EOB with HOB elevated and use of bedrails with CGA. Donned LSO sitting EOB with total A and pt ambulated 127ft with RW and CGA to therapy gym. Pt demonstrates decreased stride length, decreased trunk rotation, and mild decrease in weight shifting to L. Worked on dynamic standing balance tossing lightweight ball against rebounder 3x10 without UE support and with CGA/min A for balance. Pt performed x 6 reps alternating toe taps to 3in step with mod/max handheld A without AD. Pt with increased fear and required maximal encouragement to attempt activity. Pt transported back to room in [facility] total A and ambulated 66ft with RW and CGA to recliner. Concluded session with pt sitting in recliner, needs within reach, and chair pad alarm on.   Treatment Session 2: 8177-1165 79  min Received pt sitting in recliner, pt agreeable to therapy, and reported pain 7/10 in low back (premedicated). Repositioning, rest breaks, and distraction done to reduce pain levels. Session with emphasis on functional mobility/transfers, generalized strengthening, dynamic standing balance/coordination, ambulation, simulated car transfers, and improved activity tolerance. Pt ambulated 29ft with RW and CGA with same gait pattern mentioned above. Pt performed ambulatory simulated car transfer with RW and min A with cues for technique. Pt ambulated 47ft on uneven surfaces (ramp) with RW and CGA with cues to navigate threshold. Pt transported to dayroom in [facility] total A and transferred stand<>pivot with RW WC<>Nustep and performed BUE and BLE strengthening on Nustep at workload 3 for 10 minutes for a total of 216 steps for improved cardiovascular endurance. Worked on dynamic standing balance constructing picture with pipes and then taking them apart. Pt able to remain standing for ~4 minutes x 1 trial and ~1 minute x 1 trial with CGA for balance alternating using 1UE and no UE. Pt transported back to room in [facility] total A and ambulated 56ft with RW and CGA to recliner. Concluded session with pt sitting in recliner, needs within reach, and chair pad alarm on.   Therapy Documentation Precautions:  Precautions Precautions: Fall, Back Required Braces or Orthoses: Spinal Brace Spinal Brace: Lumbar corset, Applied in sitting position Restrictions Weight Bearing Restrictions: No   Therapy/Group: Individual Therapy Alfonse Alpers PT, DPT   05/09/2020, 7:21 AM

## 2020-05-09 NOTE — Progress Notes (Signed)
Occupational Therapy Session Note  Patient Details  Name: [name] MRN: [id number] Date of Birth: 10/15/1929  Today's Date: 05/09/2020 OT Individual Time: 5188-4166 OT Individual Time Calculation (min): 83 min    Short Term Goals: Week 1:  OT Short Term Goal 1 (Week 1): Pt will don pants with min assist with AE as needed OT Short Term Goal 2 (Week 1): Pt will complete sit > stand as needed for toileting and LB dressing with min assist OT Short Term Goal 3 (Week 1): Pt will complete bathing with min assist with AE as needed OT Short Term Goal 4 (Week 1): Pt will complete toilet transfer with min assist, ambulating with RW  Skilled Therapeutic Interventions/Progress Updates:    Treatment session with focus on self-care retraining, functional transfers, and adherence to back precautions during self-care tasks.  Pt received upright in recliner agreeable to therapy session.  Engaged in discussion regarding home equipment, pt reports having 3 in 1 and seat in shower at home - although she does not typically bathe at shower level due to small shower and fearful of falling.  Pt agreeable to shower this session.  Pt ambulated to room shower with CGA with RW.  Completed transfer in to shower with use of tub bench.  Therapist covered incision and IV site prior to shower.  Pt able to complete bathing with increased time and cues to utilize lateral leans for hygiene while adhering to back precautions.  Pt required assistance for washing lower legs and feet - would benefit from long handled sponge.  Pt donned shirt with setup and required assistance to fasten back brace.  Pt would benefit from education on AE for LB dressing, however due to time constraints, therapist assisted with LB dressing with pt able to pull pants over hips with increased time but required assist for threading pants and catheter bag.  Pt remained upright in w/c with handoff to RN to change dressing and provide medications.  Therapy  Documentation Precautions:  Precautions Precautions: Fall, Back Required Braces or Orthoses: Spinal Brace Spinal Brace: Lumbar corset, Applied in sitting position Restrictions Weight Bearing Restrictions: No Pain:  Pt reports pain 8/10 in back. Premedicated.  Offered shower for pain management.   Therapy/Group: Individual Therapy  Simonne Come 05/09/2020, 12:20 PM

## 2020-05-09 NOTE — Progress Notes (Signed)
[place] PHYSICAL MEDICINE & REHABILITATION PROGRESS NOTE  Subjective/Complaints: Patient seen sitting up in bed this morning.  She states she slept well overnight.  She states she is feeling a little bit better.  She states she had multiple bowel movements yesterday.  ROS: Denies CP, SOB, N/V/D  Objective: Vital Signs: Blood pressure 115/63, pulse 93, temperature 98.2 F (36.8 C), resp. rate 17, height 5\' 2"  (1.575 m), weight 69.6 kg, SpO2 (!) 87 %. No results found. Recent Labs    05/08/20 0821  WBC 5.6  HGB 8.4*  HCT 27.4*  PLT 599*   Recent Labs    05/08/20 0821  NA 132*  K 3.7  CL 91*  CO2 30  GLUCOSE 146*  BUN 16  CREATININE 1.36*  CALCIUM 8.9    Intake/Output Summary (Last 24 hours) at 05/09/2020 1031 Last data filed at 05/09/2020 1003 Gross per 24 hour  Intake 1120 ml  Output 1950 ml  Net -830 ml        Physical Exam: BP 115/63   Pulse 93   Temp 98.2 F (36.8 C)   Resp 17   Ht 5\' 2"  (1.575 m)   Wt 69.6 kg   SpO2 (!) 87%   BMI 28.06 kg/m  Constitutional: No distress . Vital signs reviewed. HENT: Normocephalic.  Atraumatic. Eyes: EOMI. No discharge. Cardiovascular: No JVD.  RRR. Respiratory: Normal effort.  No stridor.  Bilateral clear to auscultation. GI: Non-distended.  BS +. Skin: Warm and dry.  Back incision with dressing CDI. Psych: Normal mood.  Normal behavior. Musc: No edema in extremities.  No tenderness in extremities with distraction. GU: + Foley Neuro:  Alert and oriented. HOH Motor: B/l UE 5/5. B/l LE: 3/5 HF, 3-/5 KE and 4/5 ADF/PF, with bilateral pain inhibition ongoing, improving  Assessment/Plan: 1. Functional deficits secondary to lumbar stenosis with neurogenic claudication s/p redo laminectomy with decompression b/l L3-5 which require 3+ hours per day of interdisciplinary therapy in a comprehensive inpatient rehab setting.  Physiatrist is providing close team supervision and 24 hour management of active medical  problems listed below.  Physiatrist and rehab team continue to assess barriers to discharge/monitor patient progress toward functional and medical goals   Care Tool:  Bathing    Body parts bathed by patient: Right arm, Left arm, Chest, Abdomen, Face, Front perineal area, Buttocks, Right upper leg, Left upper leg   Body parts bathed by helper: Left lower leg, Right lower leg     Bathing assist Assist Level: Minimal Assistance - Patient > 75%     Upper Body Dressing/Undressing Upper body dressing   What is the patient wearing?: Pull over shirt, Orthosis    Upper body assist Assist Level: Minimal Assistance - Patient > 75%    Lower Body Dressing/Undressing Lower body dressing      What is the patient wearing?: Incontinence brief, Pants     Lower body assist Assist for lower body dressing: Moderate Assistance - Patient 50 - 74%     Toileting Toileting    Toileting assist Assist for toileting: Total Assistance - Patient < 25%     Transfers Chair/bed transfer  Transfers assist     Chair/bed transfer assist level: Contact Guard/Touching assist     Locomotion Ambulation   Ambulation assist      Assist level: Contact Guard/Touching assist Assistive device: Walker-rolling Max distance: 122ft   Walk 10 feet activity   Assist     Assist level: Contact Guard/Touching assist Assistive device: Walker-rolling  Walk 50 feet activity   Assist Walk 50 feet with 2 turns activity did not occur: Safety/medical concerns (fatigue, pain, generalized weakness)  Assist level: Contact Guard/Touching assist Assistive device: Walker-rolling    Walk 150 feet activity   Assist Walk 150 feet activity did not occur: Safety/medical concerns (fatigue, pain, generalized weakness)  Assist level: Contact Guard/Touching assist Assistive device: Walker-rolling    Walk 10 feet on uneven surface  activity   Assist Walk 10 feet on uneven surfaces activity did not  occur: Safety/medical concerns (fatigue, pain, generalized weakness)         Wheelchair     Assist Will patient use wheelchair at discharge?: Yes Type of Wheelchair: Manual    Wheelchair assist level: Supervision/Verbal cueing Max wheelchair distance: 32ft    Wheelchair 50 feet with 2 turns activity    Assist        Assist Level: Moderate Assistance - Patient 50 - 74%   Wheelchair 150 feet activity     Assist      Assist Level: Total Assistance - Patient < 25%    Medical Problem List and Plan: 1.Functional and mobility deficitssecondary to lumbar stenosis with neurogenic claudication, s/p re-do laminectomy with decompression of bilateral L3,L4,L5 nerve roots on 04/24/20.  Continue CIR 2. Antithrombotics: -DVT/anticoagulation:Mechanical:Sequential compression devices, below kneeBilateral lower extremities -antiplatelet therapy: N/A 3. Pain Management:  Cont Gabapentin 300mg  daily qhs  Continue Oxycontin mg  15 BID, DC'd on 11/2  Cont tramadol 50 TID  Cont Percocet 5 q4 PRN  Lidocaine patch ordered  D/cedVicodin per report of confusion  Lidoderm patch ordered for back  Behavioral component as well  Controlled when not being asked or distracted on 11/2    4. Mood:LCSW to follow for evaluation and support. -antipsychotic agents: N/A 5. Neuropsych: This patientis not fullycapable of making decisions on hisown behalf. 6. Skin/Wound Care:Monitor wound for healing 7. Fluids/Electrolytes/Nutrition:Monitor I/Os. 8. T2DM with hyperglycemia: On glucotrol 10 mg am/5 mg pm--monitor for hypoglycemic episodes with rise in SCr. Will Monitor BS ac/hs and use SSI for elevated BS.  Labile on 11/2, monitor for trend  Monitor with increased exertion 9.Acute on chronic anemia:   Continue iron supplement bid  Monitor for signs of bleeding.   Hb 8.4 on 11/1  Cont to monitor 10. CKD stage III: Baseline SCr-1.43-->1.54  Cr.   1.36 on 11/1, labs ordered for tomorrow 11. Flaccid neurogenic bladder. Patient with chronic Foley tubesince 2017 back surgery.DO NOT REMOVE FOLEY TUBE--gets foley change monthly at [facility]. 12.Chronic BLE edema/Hypertension.Monitor BP -continue Lasix 40 mg daily.   Blood pressure labile on 11/2, monitor for trend  Monitor with increased mobility 13. GERD: managed by protonix, changed timing per patient preference 14. Hyperlipidemia.Continue Lipitor 15. Chronic constipation:  KUB with constipation  MiraLAX DC'd on 11/2  Movantik ordered on 11/1 16. Hyponatremia  Na 132 on 11/1, labs ordered for tomorrow 17. Hypoalbuminemia  Supplement initiated on 10/27  LOS: 7 days A FACE TO FACE EVALUATION WAS PERFORMED  Raye Wiens Lorie Phenix 05/09/2020, 10:31 AM

## 2020-05-09 NOTE — Plan of Care (Signed)
  Problem: Consults Goal: RH GENERAL PATIENT EDUCATION Description: See Patient Education module for education specifics. Outcome: Progressing Goal: Skin Care Protocol Initiated - if Braden Score 18 or less Description: If consults are not indicated, leave blank or document N/A Outcome: Progressing Goal: Nutrition Consult-if indicated Outcome: Progressing Goal: Diabetes Guidelines if Diabetic/Glucose > 140 Description: If diabetic or lab glucose is > 140 mg/dl - Initiate Diabetes/Hyperglycemia Guidelines & Document Interventions  Outcome: Progressing   Problem: RH BOWEL ELIMINATION Goal: RH STG MANAGE BOWEL WITH ASSISTANCE Description: STG Manage Bowel with Assistance. Outcome: Progressing Goal: RH STG MANAGE BOWEL W/MEDICATION W/ASSISTANCE Description: STG Manage Bowel with Medication with Assistance. Outcome: Progressing   Problem: RH BLADDER ELIMINATION Goal: RH STG MANAGE BLADDER WITH EQUIPMENT WITH ASSISTANCE Description: STG Manage Bladder With Equipment With Assistance Outcome: Progressing   Problem: RH SKIN INTEGRITY Goal: RH STG SKIN FREE OF INFECTION/BREAKDOWN Outcome: Progressing Goal: RH STG MAINTAIN SKIN INTEGRITY WITH ASSISTANCE Description: STG Maintain Skin Integrity With Assistance. Outcome: Progressing Goal: RH STG ABLE TO PERFORM INCISION/WOUND CARE W/ASSISTANCE Description: STG Able To Perform Incision/Wound Care With Assistance. Outcome: Progressing   Problem: RH SAFETY Goal: RH STG ADHERE TO SAFETY PRECAUTIONS W/ASSISTANCE/DEVICE Description: STG Adhere to Safety Precautions With Assistance/Device. Outcome: Progressing   Problem: RH PAIN MANAGEMENT Goal: RH STG PAIN MANAGED AT OR BELOW PT'S PAIN GOAL Outcome: Progressing   Problem: RH KNOWLEDGE DEFICIT GENERAL Goal: RH STG INCREASE KNOWLEDGE OF SELF CARE AFTER HOSPITALIZATION Outcome: Progressing

## 2020-05-10 ENCOUNTER — Inpatient Hospital Stay (HOSPITAL_COMMUNITY): Payer: Medicare Other | Admitting: Occupational Therapy

## 2020-05-10 ENCOUNTER — Inpatient Hospital Stay (HOSPITAL_COMMUNITY): Payer: Medicare Other

## 2020-05-10 DIAGNOSIS — E876 Hypokalemia: Secondary | ICD-10-CM

## 2020-05-10 LAB — GLUCOSE, CAPILLARY
Glucose-Capillary: 164 mg/dL — ABNORMAL HIGH (ref 70–99)
Glucose-Capillary: 101 mg/dL — ABNORMAL HIGH (ref 70–99)
Glucose-Capillary: 130 mg/dL — ABNORMAL HIGH (ref 70–99)
Glucose-Capillary: 159 mg/dL — ABNORMAL HIGH (ref 70–99)

## 2020-05-10 LAB — BASIC METABOLIC PANEL
Anion gap: 9 (ref 5–15)
BUN: 16 mg/dL (ref 8–23)
CO2: 32 mmol/L (ref 22–32)
Calcium: 8.9 mg/dL (ref 8.9–10.3)
Chloride: 93 mmol/L — ABNORMAL LOW (ref 98–111)
Creatinine, Ser: 1.34 mg/dL — ABNORMAL HIGH (ref 0.44–1.00)
GFR, Estimated: 38 mL/min — ABNORMAL LOW (ref >60)
Glucose, Bld: 144 mg/dL — ABNORMAL HIGH (ref 70–99)
Potassium: 3.4 mmol/L — ABNORMAL LOW (ref 3.5–5.1)
Sodium: 134 mmol/L — ABNORMAL LOW (ref 135–145)

## 2020-05-10 MED ORDER — POTASSIUM CHLORIDE CRYS ER 20 MEQ PO TBCR
40.0000 meq | EXTENDED_RELEASE_TABLET | Freq: Once | ORAL | Status: AC
  Administered 2020-05-10: 40 meq via ORAL
  Filled 2020-05-10: qty 2

## 2020-05-10 NOTE — Progress Notes (Signed)
Patient ID: [name], female   DOB: 05/24/1930, 86 y.o.   MRN: [id number]  Met with pt and have left a message for her daughter. Pt reports she had cataract surgery on Monday and is home recovering. Informed of team conference goals supervision level and target discharge date 11/10. Pt is having pan today and did not sleep well last night. She has all equipment needed and home health is active with [facility]. Will work on discharge needs and await return call from daughter.  

## 2020-05-10 NOTE — Patient Care Conference (Signed)
Inpatient RehabilitationTeam Conference and Plan of Care Update Date: 05/10/2020   Time: 11:41 AM    Patient Name: [name]      Medical Record Number: [id number]  Date of Birth: 1929/11/08 Sex: Female         Room/Bed: 4W17C/4W17C-01 Payor Info: Payor: MEDICARE / Plan: MEDICARE PART A AND B / Product Type: *No Product type* /    Admit Date/Time:  05/02/2020  5:12 PM  Primary Diagnosis:  Lumbar radiculopathy  Hospital Problems: Principal Problem:   Lumbar radiculopathy Active Problems:   Constipation   Hyponatremia   Hypoalbuminemia due to protein-calorie malnutrition (HCC)   Benign essential HTN   Stage 3b chronic kidney disease ([place])   Acute on chronic combined systolic and diastolic heart failure (HCC)   Chronic pain syndrome   Controlled type 2 diabetes mellitus with hyperglycemia, without long-term current use of insulin ([place])   Drug induced constipation   Neurogenic bladder   History of hypertension   Hypokalemia    Expected Discharge Date: Expected Discharge Date: 05/17/20  Team Members Present: Physician leading conference: Dr. Delice Lesch Care Coodinator Present: Dorien Chihuahua, RN, BSN, CRRN;Becky Dupree, LCSW Nurse Present: Other (comment) Dina Rich, RN) PT Present: Becky Sax, PT OT Present: Simonne Come, OT PPS Coordinator present : Ileana Ladd, Burna Mortimer, SLP     Current Status/Progress Goal Weekly Team Focus  Bowel/Bladder   Continent with incontinent episodes  assess resident q 2 hours to prevent incontinent episodes/maintain continency  assess q shift/PRN   Swallow/Nutrition/ Hydration             ADL's   CGA sit > stand and ambulatory transfers with RW, Max assist LB dressing, Min assist bathing and UB dressing  Supervision  ADL retraining, functional transfers, dynamic standing balance, endurance   Mobility   bed mobility min A, transfers min A/CGA, gait 154ft with RW CGA, 4 steps 2 rails mod A  supervision  functional  mobility/transfers, generalized strengthening, dynamic standing balance/coordination, ambulation, and endurance   Communication             Safety/Cognition/ Behavioral Observations            Pain   complaints of severe pain lower back, requires PRNs to have continuous sleep at night  to sleep throughout night without break through pain  assess pain with each shift and as needed   Skin   surgical incison midline lower back  closure of wound without s/s of infection  assess q shift and PRN to identify any necrotic tissue/infection signs /symptoms     Discharge Planning:  Home with husband who has health issues, daughter goes to their home daily from 11-6 pm to assist. Daughter has been here and observed in therapies   Team Discussion: MD addressed pain issue, CBG fluctuate and BP stable. Adjust medication and rechecking labs. Staff noted supervision level with rolling walker and min assist with ADLs using adaptive equipment.  Patient on target to meet rehab goals: yes, supervision goals set  *See Care Plan and progress notes for long and short-term goals.   Revisions to Treatment Plan:  Address care with adaptive equipment available  Teaching Needs:  Transfers, toileting, medications, etc.   Current Barriers to Discharge: None Possible Resolutions to Barriers: HH follow up recommended    Medical Summary Current Status: Functional and mobility deficits secondary to lumbar stenosis with neurogenic claudication, s/p re-do laminectomy with decompression of bilateral L3,L4,L5 nerve roots on 04/24/20.  Barriers to  Discharge: Medical stability;Decreased family/caregiver support;Other (comments);Incontinence   Possible Resolutions to [employer] Focus: Therapies, follow labs- Na, K+, Cr, optimize bowel meds, optimize DM meds, encourage distraction for pain   Continued Need for Acute Rehabilitation Level of Care: The patient requires daily medical management by a physician with  specialized training in physical medicine and rehabilitation for the following reasons: Direction of a multidisciplinary physical rehabilitation program to maximize functional independence : Yes Medical management of patient stability for increased activity during participation in an intensive rehabilitation regime.: Yes Analysis of laboratory values and/or radiology reports with any subsequent need for medication adjustment and/or medical intervention. : Yes   I attest that I was present, lead the team conference, and concur with the assessment and plan of the team.   Dorien Chihuahua B 05/10/2020, 2:32 PM

## 2020-05-10 NOTE — Progress Notes (Signed)
Occupational Therapy Weekly Progress Note  Patient Details  Name: [name] MRN: [id number] Date of Birth: [birth year]  Beginning of progress report period: May 03, 2020 End of progress report period: May 10, 2020  Today's Date: 05/10/2020 OT Individual Time: 6378-5885 OT Individual Time Calculation (min): 55 min    Patient has met 3 of 4 short term goals.  Pt is making steady progress towards goals.  Pt currently requires CGA for sit > stand and ambulatory transfers with RW.  Pt is able to complete bathing and UB dressing with min assist.  Pt continues to require increased assistance for LB dressing due to back precautions and catheter tubing - pt would benefit from continued practice with AE to increase independence with LB dressing.  Pt initially with limited participation due to back pain, nausea and constipation, but has since improved in her participation as constipation has eased off pain has also decreased.    Patient continues to demonstrate the following deficits: muscle weakness, decreased coordination and decreased standing balance, decreased balance strategies and difficulty maintaining precautions and therefore will continue to benefit from skilled OT intervention to enhance overall performance with BADL and Reduce care partner burden.  Patient progressing toward long term goals..  Continue plan of care.  OT Short Term Goals Week 1:  OT Short Term Goal 1 (Week 1): Pt will don pants with min assist with AE as needed OT Short Term Goal 1 - Progress (Week 1): Progressing toward goal OT Short Term Goal 2 (Week 1): Pt will complete sit > stand as needed for toileting and LB dressing with min assist OT Short Term Goal 2 - Progress (Week 1): Met OT Short Term Goal 3 (Week 1): Pt will complete bathing with min assist with AE as needed OT Short Term Goal 3 - Progress (Week 1): Met OT Short Term Goal 4 (Week 1): Pt will complete toilet transfer with min assist, ambulating  with RW OT Short Term Goal 4 - Progress (Week 1): Met Week 2:  OT Short Term Goal 1 (Week 2): STG = LTGs due to remaining ELOS  Skilled Therapeutic Interventions/Progress Updates:    Treatment session with focus on self-care retraining with education on use of AE to increase independence with LB dressing while adhering to back precautions.  Pt received upright in recliner, reporting stiffness and pain in back -premedicated.  Therapist encouraged pt to engage in functional mobility to "loosen" up back.  Therapist introduced AE to increase independence with LB dressing.  Therapist demonstrated use of reacher for doffing and donning pants.  Pt able to doff pants with increased time due to having to thread catheter tubing through pants.  Pt able to then don pants with use of reacher and threading catheter prior to donning pants.  Pt completed sit> stand with CGA to pull pants over hips. Therapist discussed options to increase independence with donning back brace as pt continues to have difficulties due to her breast interference.  Pt ambulated 120' with RW with CGA for increased mobility and activity tolerance.  Pt with incontinence of bowel, therapist assisted with hygiene and donning clean brief.  Pt remained upright in recliner with chair alarm on and all needs in reach.  Therapy Documentation Precautions:  Precautions Precautions: Fall, Back Required Braces or Orthoses: Spinal Brace Spinal Brace: Lumbar corset, Applied in sitting position Restrictions Weight Bearing Restrictions: No Pain: Pt with c/o pain in back, not rated.    Therapy/Group: Individual Therapy  Simonne Come 05/10/2020,  7:29 AM

## 2020-05-10 NOTE — Progress Notes (Signed)
[name] PHYSICAL MEDICINE & REHABILITATION PROGRESS NOTE  Subjective/Complaints: Patient seen standing up and then sitting down, working with therapy this morning.  She states she did not sleep well overnight due to back pain, likely from positioning.  Discussed with therapies.  Discussed functional gains with therapies as well.  Patient concerned about discharge today.  She has questions regarding Foley tube placement.  ROS: Denies CP, SOB, N/V/D  Objective: Vital Signs: Blood pressure (!) 128/54, pulse (!) 108, temperature 98.5 F (36.9 C), resp. rate 16, height 5\' 2"  (1.575 m), weight 64.5 kg, SpO2 (!) 84 %. No results found. Recent Labs    05/08/20 0821  WBC 5.6  HGB 8.4*  HCT 27.4*  PLT 599*   Recent Labs    05/08/20 0821 05/10/20 0413  NA 132* 134*  K 3.7 3.4*  CL 91* 93*  CO2 30 32  GLUCOSE 146* 144*  BUN 16 16  CREATININE 1.36* 1.34*  CALCIUM 8.9 8.9    Intake/Output Summary (Last 24 hours) at 05/10/2020 1035 Last data filed at 05/10/2020 0908 Gross per 24 hour  Intake 390 ml  Output 1400 ml  Net -1010 ml        Physical Exam: BP (!) 128/54 (BP Location: Right Arm)   Pulse (!) 108   Temp 98.5 F (36.9 C)   Resp 16   Ht 5\' 2"  (1.575 m)   Wt 64.5 kg   SpO2 (!) 84%   BMI 26.01 kg/m  Constitutional: No distress . Vital signs reviewed. HENT: Normocephalic.  Atraumatic. Eyes: EOMI. No discharge. Cardiovascular: No JVD.  RRR. Respiratory: Normal effort.  No stridor.  Bilateral clear to auscultation. GI: Non-distended.  BS +. Skin: Warm and dry.  Back incision with dressing CDI. Psych: Normal mood.  Normal behavior. Musc: No edema in extremities.  No tenderness in extremities. GU: + Foley Neuro:  Alert and oriented. HOH Motor: B/l UE 5/5. B/l LE: 3/5 HF, 3-/5 KE and 4/5 ADF/PF, with bilateral pain inhibition ongoing, improving  Assessment/Plan: 1. Functional deficits secondary to lumbar stenosis with neurogenic claudication s/p redo  laminectomy with decompression b/l L3-5 which require 3+ hours per day of interdisciplinary therapy in a comprehensive inpatient rehab setting.  Physiatrist is providing close team supervision and 24 hour management of active medical problems listed below.  Physiatrist and rehab team continue to assess barriers to discharge/monitor patient progress toward functional and medical goals   Care Tool:  Bathing    Body parts bathed by patient: Right arm, Left arm, Chest, Abdomen, Face, Front perineal area, Buttocks, Right upper leg, Left upper leg   Body parts bathed by helper: Right lower leg, Left lower leg     Bathing assist Assist Level: Minimal Assistance - Patient > 75%     Upper Body Dressing/Undressing Upper body dressing   What is the patient wearing?: Pull over shirt, Orthosis    Upper body assist Assist Level: Minimal Assistance - Patient > 75%    Lower Body Dressing/Undressing Lower body dressing      What is the patient wearing?: Incontinence brief, Pants     Lower body assist Assist for lower body dressing: Moderate Assistance - Patient 50 - 74%     Toileting Toileting    Toileting assist Assist for toileting: Total Assistance - Patient < 25%     Transfers Chair/bed transfer  Transfers assist     Chair/bed transfer assist level: Contact Guard/Touching assist     Locomotion Ambulation   Ambulation assist  Assist level: Contact Guard/Touching assist Assistive device: Walker-rolling Max distance: 232ft   Walk 10 feet activity   Assist     Assist level: Contact Guard/Touching assist Assistive device: Walker-rolling   Walk 50 feet activity   Assist Walk 50 feet with 2 turns activity did not occur: Safety/medical concerns (fatigue, pain, generalized weakness)  Assist level: Contact Guard/Touching assist Assistive device: Walker-rolling    Walk 150 feet activity   Assist Walk 150 feet activity did not occur: Safety/medical  concerns (fatigue, pain, generalized weakness)  Assist level: Contact Guard/Touching assist Assistive device: Walker-rolling    Walk 10 feet on uneven surface  activity   Assist Walk 10 feet on uneven surfaces activity did not occur: Safety/medical concerns (fatigue, pain, generalized weakness)         Wheelchair     Assist Will patient use wheelchair at discharge?: Yes Type of Wheelchair: Manual    Wheelchair assist level: Supervision/Verbal cueing Max wheelchair distance: 36ft    Wheelchair 50 feet with 2 turns activity    Assist        Assist Level: Moderate Assistance - Patient 50 - 74%   Wheelchair 150 feet activity     Assist      Assist Level: Total Assistance - Patient < 25%    Medical Problem List and Plan: 1.Functional and mobility deficitssecondary to lumbar stenosis with neurogenic claudication, s/p re-do laminectomy with decompression of bilateral L3,L4,L5 nerve roots on 04/24/20.  Continue CIR  Team conference today to discuss current and goals and coordination of care, home and environmental barriers, and discharge planning with nursing, case manager, and therapies. Please see conference note from today as well.  2. Antithrombotics: -DVT/anticoagulation:Mechanical:Sequential compression devices, below kneeBilateral lower extremities -antiplatelet therapy: N/A 3. Pain Management:  Cont Gabapentin 300mg  daily qhs  Continue Oxycontin mg  15 BID, DC'd on 11/2  Cont tramadol 50 TID  Cont Percocet 5 q4 PRN  Lidocaine patch ordered  D/cedVicodin per report of confusion  Lidoderm patch ordered for back  Behavioral component as well  Controlled when not being asked or distracted on 11/3 4. Mood:LCSW to follow for evaluation and support. -antipsychotic agents: N/A 5. Neuropsych: This patientis not fullycapable of making decisions on hisown behalf. 6. Skin/Wound Care:Monitor wound for healing 7.  Fluids/Electrolytes/Nutrition:Monitor I/Os. 8. T2DM with hyperglycemia: On glucotrol 10 mg am/5 mg pm--monitor for hypoglycemic episodes with rise in SCr. Will Monitor BS ac/hs and use SSI for elevated BS.  Labile on 11/3, monitor for trend  Monitor with increased exertion 9.Acute on chronic anemia:   Continue iron supplement bid  Monitor for signs of bleeding.   Hb 8.4 on 11/1  Cont to monitor 10. CKD stage III: Baseline SCr-1.43-->1.54  Cr.  1.34 on 11/3 11. Flaccid neurogenic bladder. Patient with chronic Foley tubesince 2017 back surgery.We will change chronic Foley today--gets foley change monthly at [facility]. 12.Chronic BLE edema/Hypertension.Monitor BP -continue Lasix 40 mg daily.   Blood pressure relatively controlled on 11/3  Monitor with increased mobility 13. GERD: managed by protonix, changed timing per patient preference 14. Hyperlipidemia.Continue Lipitor 15. Chronic constipation:  KUB with constipation  MiraLAX DC'd on 11/2  Movantik ordered on 11/1  Improving 16. Hyponatremia  Na 134 on 11/3 17. Hypoalbuminemia  Supplement initiated on 10/27 18.  Hypokalemia  Potassium 3.4 on 11/3  Supplemented x1 day  LOS: 8 days A FACE TO FACE EVALUATION WAS PERFORMED  Josie Burleigh Lorie Phenix 05/10/2020, 10:35 AM

## 2020-05-10 NOTE — Progress Notes (Signed)
Physical Therapy Session Note  Patient Details  Name: [name] MRN: [id number] Date of Birth: 01-26-1930  Today's Date: 05/10/2020 PT Individual Time: 1607-3710 and 1301-1340 PT Individual Time Calculation (min): 55 min and 39 min  Short Term Goals: Week 1:  PT Short Term Goal 1 (Week 1): pt will perform bed mobility with min A consistantly PT Short Term Goal 1 - Progress (Week 1): Met PT Short Term Goal 2 (Week 1): pt will transfer bed<>WC with LRAD min A PT Short Term Goal 2 - Progress (Week 1): Met PT Short Term Goal 3 (Week 1): Pt will ambulate 39f with LRAD CGA PT Short Term Goal 3 - Progress (Week 1): Met Week 2:  PT Short Term Goal 1 (Week 2): STG=LTG due to LOS  Skilled Therapeutic Interventions/Progress Updates:  Treatment Session 1: 06269-485455 min Received pt supine in bed stating "I'm not doing well, my pain pill hasn't kicked in yet", but pt agreeable to therapy, and reported pain 9/10 in low back (premedicated). Repositioning, rest breaks, and distraction done to reduce pain levels. Session with emphasis on functional mobility/transfers, generalized strengthening, dynamic standing balance/coordination, ambulation, stair navigation, and improved activity tolerance. Donned bilateral ted hose and shoes total A and pt transferred supine<>sitting EOB with HOB elevated and use of bedrails with supervision. Pt required ~3 minute rest [facility] to c/o of dizziness. Donned LSO total A and pt transferred bed<>WC stand<>pivot with RW and CGA. [facility] for time management purposes and navigated 4 steps with 2 rails and min A ascending and descending with a step to pattern with cues for "up with the good, down with the bad" technique. On Biodex pt performed the following balance activities on level static: -Postural stability training 1.5 min starting with BUE support (first 30 seconds) transitioning to no UE support (1 minute)  with CGA for balance  -Weight shift training 1.5 min with BUE support and CGA -Limits of stability training 2 min 21 seconds with BUE support and CGA MD present for morning rounds and to discuss catheter placement in stomach. Pt [facility] purposes and ambulated 126fwith RW and CGA to recliner. Concluded session with pt sitting in recliner, needs within reach, and chair pad alarm on.   Treatment Session 2: 136270-35009 min Received pt sitting in recliner, pt agreeable to therapy, and reported pain 8/10 in low back (premedicated). Pt stated her pain has not improved since this morning. Of note, pt c/o pain less if therapist does not ask about pain levels. Repositioning, rest breaks, and distraction done to reduce pain levels. Session with emphasis on dressing, functional mobility/transfers, generalized strengthening, dynamic standing balance/coordination, ambulation, and improved activity tolerance. Pt hesitant about discharge date stating "I won't be able to do much when I get home". Therapist informed pt she is currently walking >15038fith RW and CGA with no physical assist from therapist; however pt still concerned responding with "well I don't know." Pt ambulated [id number] trials with RW and CGA to/from dayroom. Pt transferred sit<>stand on Airex with min A x 4 trials and worked on dynamic standing balance performing the following activities: -narrow BOS starting with BUE support fading to no UE support and CGA for 1 minute x 2 trials  -SLS with BUE support on RW and CGA x40 seconds on LLE and x60 seconds on RLE -SLS on R LE  with 1UE support on RW x60 seconds. Pt unable to perform on LLE with 1 UE support due to c/o increased back pain Noted weakness LLE>RLE during balance activities. Concluded session with pt sitting in recliner, needs within reach, and chair pad alarm on.   Therapy Documentation Precautions:  Precautions Precautions: Fall,  Back Required Braces or Orthoses: Spinal Brace Spinal Brace: Lumbar corset, Applied in sitting position Restrictions Weight Bearing Restrictions: No  Therapy/Group: Individual Therapy Alfonse Alpers PT, DPT   05/10/2020, 7:25 AM

## 2020-05-10 NOTE — Plan of Care (Signed)
  Problem: RH BOWEL ELIMINATION Goal: RH STG MANAGE BOWEL WITH ASSISTANCE Description: STG Manage Bowel with Assistance. Outcome: Progressing Goal: RH STG MANAGE BOWEL W/MEDICATION W/ASSISTANCE Description: STG Manage Bowel with Medication with Assistance. Outcome: Progressing   Problem: RH BLADDER ELIMINATION Goal: RH STG MANAGE BLADDER WITH EQUIPMENT WITH ASSISTANCE Description: STG Manage Bladder With Equipment With Assistance Outcome: Progressing

## 2020-05-10 NOTE — Progress Notes (Signed)
Occupational Therapy Session Note  Patient Details  Name: [name] MRN: [id number] Date of Birth: 1930-03-14  Today's Date: 05/10/2020 OT Individual Time: 1417-1500 OT Individual Time Calculation (min): 43 min    Short Term Goals: Week 2:  OT Short Term Goal 1 (Week 2): STG = LTGs due to remaining ELOS  Skilled Therapeutic Interventions/Progress Updates:    Treatment session with focus on functional mobility and endurance.  Pt received upright in recliner reporting back pain but agreeable to therapy session.  Pt ambulated to Dayroom with RW with CGA with focus on endurance.  Engaged in dynamic standing activity to challenge balance and endurance while completing table top task.  Therapist challenged pt with alternating toe taps with focus on weight shifting, balance, and endurance as needed for self-care tasks and functional mobility.  Pt required seated rest breaks between activities due to fatigue and back pain.  Ambulated back to room with RW with Min assist due to fatigue.  Pt returned to sidelying in bed for comfort.  Pt left with all needs in reach.  Therapy Documentation Precautions:  Precautions Precautions: Fall, Back Required Braces or Orthoses: Spinal Brace Spinal Brace: Lumbar corset, Applied in sitting position Restrictions Weight Bearing Restrictions: No General:   Vital Signs: Therapy Vitals Temp: (!) 97.5 F (36.4 C) Temp Source: Oral Pulse Rate: 96 Resp: 20 BP: (!) 121/58 Patient Position (if appropriate): Sitting Oxygen Therapy SpO2: 97 % O2 Device: Room Air Pain:  Pt with c/o pain in back, premedicated.  Repositioned in sidelying at end of session.   Therapy/Group: Individual Therapy  Simonne Come 05/10/2020, 3:00 PM

## 2020-05-10 NOTE — Progress Notes (Signed)
Physical Therapy Weekly Progress Note  Patient Details  Name: [name] MRN: [id number] Date of Birth: 1929-07-28  Beginning of progress report period: May 03, 2020 End of progress report period: May 10, 2020  Patient has met 3 of 3 short term goals. Pt demonstrates steady progress towards long term goals. Pt currently requires CGA/supervision for bed mobility, CGA for transfers with RW, CGA to ambulate >164f with RW, and min A to navigate 4 steps with 2 rails. Pt continues to be limited by low back pain, decreased balance/postural control, and endurance deficits.   Patient continues to demonstrate the following deficits muscle weakness and decreased standing balance and decreased balance strategies and therefore will continue to benefit from skilled PT intervention to increase functional independence with mobility.  Patient progressing toward long term goals..  Continue plan of care.  PT Short Term Goals Week 1:  PT Short Term Goal 1 (Week 1): pt will perform bed mobility with min A consistantly PT Short Term Goal 1 - Progress (Week 1): Met PT Short Term Goal 2 (Week 1): pt will transfer bed<>WC with LRAD min A PT Short Term Goal 2 - Progress (Week 1): Met PT Short Term Goal 3 (Week 1): Pt will ambulate 210fwith LRAD CGA PT Short Term Goal 3 - Progress (Week 1): Met Week 2:  PT Short Term Goal 1 (Week 2): STG=LTG due to LOS  Skilled Therapeutic Interventions/Progress Updates:  Ambulation/gait training;Discharge planning;Functional mobility training;Therapeutic Activities;Balance/vestibular training;Disease management/prevention;Neuromuscular re-education;Therapeutic Exercise;Wheelchair propulsion/positioning;DME/adaptive equipment instruction;Pain management;Splinting/orthotics;UE/LE Strength taining/ROM;Community reintegration;Functional electrical stimulation;Patient/family education;Stair training;UE/LE Coordination activities   Therapy Documentation Precautions:   Precautions Precautions: Fall, Back Required Braces or Orthoses: Spinal Brace Spinal Brace: Lumbar corset, Applied in sitting position Restrictions Weight Bearing Restrictions: No  Therapy/Group: Individual Therapy AnAlfonse AlpersT, DPT   05/10/2020, 7:21 AM

## 2020-05-11 ENCOUNTER — Inpatient Hospital Stay (HOSPITAL_COMMUNITY): Payer: Medicare Other

## 2020-05-11 ENCOUNTER — Inpatient Hospital Stay (HOSPITAL_COMMUNITY): Payer: Medicare Other | Admitting: Occupational Therapy

## 2020-05-11 LAB — GLUCOSE, CAPILLARY
Glucose-Capillary: 109 mg/dL — ABNORMAL HIGH (ref 70–99)
Glucose-Capillary: 124 mg/dL — ABNORMAL HIGH (ref 70–99)
Glucose-Capillary: 181 mg/dL — ABNORMAL HIGH (ref 70–99)
Glucose-Capillary: 111 mg/dL — ABNORMAL HIGH (ref 70–99)

## 2020-05-11 MED ORDER — NALOXEGOL OXALATE 25 MG PO TABS
25.0000 mg | ORAL_TABLET | Freq: Every day | ORAL | Status: DC
  Administered 2020-05-12 – 2020-05-17 (×6): 25 mg via ORAL
  Filled 2020-05-11 (×6): qty 1

## 2020-05-11 MED ORDER — NALOXEGOL OXALATE 12.5 MG PO TABS
12.5000 mg | ORAL_TABLET | Freq: Once | ORAL | Status: AC
  Administered 2020-05-11: 12.5 mg via ORAL
  Filled 2020-05-11: qty 1

## 2020-05-11 NOTE — Progress Notes (Signed)
Patient ID: [name], female   DOB: [birth year], 86 y.o.   MRN: [id number]  Spoke with daughter via telephone to discuss team conference goals supervision level and target discharge date 11/10. Pt had told PT daughter has second cataract surgery 11/10, but it is actually 11/15. Daughter aware pt will need 24 hr supervision at discharge from rehab.

## 2020-05-11 NOTE — Progress Notes (Signed)
[facility] PHYSICAL MEDICINE & REHABILITATION PROGRESS NOTE  Subjective/Complaints: Patient seen sitting up in bed this morning.  She states she slept well overnight.  She states she feels more comfortable.  She states she has not had a bowel movement since Monday.  She is concerned about being ready for discharge at the scheduled date.  Reassured patient.  ROS: Denies CP, SOB, N/V/D  Objective: Vital Signs: Blood pressure (!) 116/49, pulse 98, temperature 98.2 F (36.8 C), temperature source Oral, resp. rate 18, height 5\' 2"  (1.575 m), weight 64.5 kg, SpO2 94 %. No results found. No results for input(s): WBC, HGB, HCT, PLT in the last 72 hours. Recent Labs    05/10/20 0413  NA 134*  K 3.4*  CL 93*  CO2 32  GLUCOSE 144*  BUN 16  CREATININE 1.34*  CALCIUM 8.9    Intake/Output Summary (Last 24 hours) at 05/11/2020 1352 Last data filed at 05/11/2020 1100 Gross per 24 hour  Intake 340 ml  Output 1300 ml  Net -960 ml        Physical Exam: BP (!) 116/49 (BP Location: Right Arm)   Pulse 98   Temp 98.2 F (36.8 C) (Oral)   Resp 18   Ht 5\' 2"  (1.575 m)   Wt 64.5 kg   SpO2 94%   BMI 26.01 kg/m  Constitutional: No distress . Vital signs reviewed. HENT: Normocephalic.  Atraumatic. Eyes: EOMI. No discharge. Cardiovascular: No JVD.  RRR. Respiratory: Normal effort.  No stridor.  Bilateral clear to auscultation. GI: Non-distended.  BS +. Skin: Warm and dry.  Back incision with dressing CDI Psych: Normal mood.  Normal behavior. Musc: No edema in extremities.  No tenderness in extremities. GU: + Foley Neuro:  Alert and oriented. HOH Motor: B/l UE 5/5. B/l LE: 3+/5 HF, 3+/5 KE and 4/5 ADF/PF, with bilateral pain inhibition   Assessment/Plan: 1. Functional deficits secondary to lumbar stenosis with neurogenic claudication s/p redo laminectomy with decompression b/l L3-5 which require 3+ hours per day of interdisciplinary therapy in a comprehensive inpatient rehab  setting.  Physiatrist is providing close team supervision and 24 hour management of active medical problems listed below.  Physiatrist and rehab team continue to assess barriers to discharge/monitor patient progress toward functional and medical goals   Care Tool:  Bathing    Body parts bathed by patient: Right arm, Left arm, Chest, Abdomen, Face, Front perineal area, Buttocks, Right upper leg, Left upper leg   Body parts bathed by helper: Right lower leg, Left lower leg     Bathing assist Assist Level: Minimal Assistance - Patient > 75%     Upper Body Dressing/Undressing Upper body dressing   What is the patient wearing?: Pull over shirt, Orthosis    Upper body assist Assist Level: Minimal Assistance - Patient > 75%    Lower Body Dressing/Undressing Lower body dressing      What is the patient wearing?: Pants     Lower body assist Assist for lower body dressing: Contact Guard/Touching assist (with reacher)     Toileting Toileting    Toileting assist Assist for toileting: Total Assistance - Patient < 25%     Transfers Chair/bed transfer  Transfers assist     Chair/bed transfer assist level: Contact Guard/Touching assist     Locomotion Ambulation   Ambulation assist      Assist level: Supervision/Verbal cueing Assistive device: Walker-rolling Max distance: 138ft   Walk 10 feet activity   Assist  Assist level: Supervision/Verbal cueing Assistive device: Walker-rolling   Walk 50 feet activity   Assist Walk 50 feet with 2 turns activity did not occur: Safety/medical concerns (fatigue, pain, generalized weakness)  Assist level: Supervision/Verbal cueing Assistive device: Walker-rolling    Walk 150 feet activity   Assist Walk 150 feet activity did not occur: Safety/medical concerns (fatigue, pain, generalized weakness)  Assist level: Contact Guard/Touching assist Assistive device: Walker-rolling    Walk 10 feet on uneven surface   activity   Assist Walk 10 feet on uneven surfaces activity did not occur: Safety/medical concerns (fatigue, pain, generalized weakness)         Wheelchair     Assist Will patient use wheelchair at discharge?: Yes Type of Wheelchair: Manual    Wheelchair assist level: Supervision/Verbal cueing Max wheelchair distance: 32ft    Wheelchair 50 feet with 2 turns activity    Assist        Assist Level: Moderate Assistance - Patient 50 - 74%   Wheelchair 150 feet activity     Assist      Assist Level: Total Assistance - Patient < 25%    Medical Problem List and Plan: 1.Functional and mobility deficitssecondary to lumbar stenosis with neurogenic claudication, s/p re-do laminectomy with decompression of bilateral L3,L4,L5 nerve roots on 04/24/20.  Continue CIR 2. Antithrombotics: -DVT/anticoagulation:Mechanical:Sequential compression devices, below kneeBilateral lower extremities -antiplatelet therapy: N/A 3. Pain Management:  Cont Gabapentin 300mg  daily qhs  Continue Oxycontin mg  15 BID, DC'd on 11/2  Cont tramadol 50 TID  Cont Percocet 5 q4 PRN  Lidocaine patch ordered  D/cedVicodin per report of confusion  Lidoderm patch ordered for back  Behavioral component as well  Controlled when not being asked or distracted on 11/4 4. Mood:LCSW to follow for evaluation and support. -antipsychotic agents: N/A 5. Neuropsych: This patientis not fullycapable of making decisions on hisown behalf. 6. Skin/Wound Care:Monitor wound for healing 7. Fluids/Electrolytes/Nutrition:Monitor I/Os. 8. T2DM with hyperglycemia: On glucotrol 10 mg am/5 mg pm--monitor for hypoglycemic episodes with rise in SCr. Will Monitor BS ac/hs and use SSI for elevated BS.  Remains slightly labile, but overall controlled on 11/4  Monitor with increased exertion 9.Acute on chronic anemia:   Continue iron supplement bid  Monitor for signs of  bleeding.   Hb 8.4 on 11/1  Cont to monitor 10. CKD stage III: Baseline SCr-1.43-->1.54  Cr.  1.34 on 11/3 11. Flaccid neurogenic bladder. Patient with chronic Foley tubesince 2017 back surgery.Changed chronic Foley on 11/4--gets foley change monthly at [facility]. 12.Chronic BLE edema/Hypertension.Monitor BP -continue Lasix 40 mg daily.   Blood pressure controlled on 11/4  Monitor with increased mobility 13. GERD: managed by protonix, changed timing per patient preference 14. Hyperlipidemia.Continue Lipitor 15. Chronic constipation:  KUB with constipation  MiraLAX DC'd on 11/2  Movantik ordered on 11/1, increased on 11/4 16. Hyponatremia  Na 134 on 11/3 17. Hypoalbuminemia  Supplement initiated on 10/27 18.  Hypokalemia  Potassium 3.4 on 11/3, labs ordered for tomorrow  Supplemented x1 day  LOS: 9 days A FACE TO FACE EVALUATION WAS PERFORMED  Brealyn Baril Lorie Phenix 05/11/2020, 1:52 PM

## 2020-05-11 NOTE — Plan of Care (Signed)
  Problem: RH BOWEL ELIMINATION Goal: RH STG MANAGE BOWEL WITH ASSISTANCE Description: STG Manage Bowel with Assistance. Outcome: Progressing Goal: RH STG MANAGE BOWEL W/MEDICATION W/ASSISTANCE Description: STG Manage Bowel with Medication with Assistance. Outcome: Progressing   Problem: RH SKIN INTEGRITY Goal: RH STG SKIN FREE OF INFECTION/BREAKDOWN Outcome: Progressing Goal: RH STG MAINTAIN SKIN INTEGRITY WITH ASSISTANCE Description: STG Maintain Skin Integrity With Assistance. Outcome: Progressing Goal: RH STG ABLE TO PERFORM INCISION/WOUND CARE W/ASSISTANCE Description: STG Able To Perform Incision/Wound Care With Assistance. Outcome: Progressing

## 2020-05-11 NOTE — Progress Notes (Signed)
Occupational Therapy Session Note  Patient Details  Name: [name] MRN: [id number] Date of Birth: October 25, 1929  Today's Date: 05/11/2020 OT Individual Time: 4259-5638 OT Individual Time Calculation (min): 43 min    Short Term Goals: Week 2:  OT Short Term Goal 1 (Week 2): STG = LTGs due to remaining ELOS   Skilled Therapeutic Interventions/Progress Updates:    Pt greeted at time of session sitting up in recliner agreeable to OT session, daughter present as well throughout. Pt agreeable to change clothes stating she had the same ones on for the past couple days. Doffed shirt and othosis with Supervision and donned new shirt and orthotic with Min A, assist with getting brace tight enough. When standing to doff pants, pt able to doff off hips and pt had noticeable BM. Ambulated to bathroom CGA with RW and transferred to toilet in same manner. Doffed clothing with assist d/t no reacher at the time and to doff wet brief. Pt used hygiene tongs to clean buttocks from BM, unable to fully clean thoroughly. Therapist assist with pt in standing to get all of BM. Donned new pants Min A d/t not having reacher at the time to thread feet and Foley d/t back precautions, pt aware of back precautions and followed throughout session. Ambulated back to chair CGA with RW and alarm on, call bell in reach.   Therapy Documentation Precautions:  Precautions Precautions: Fall, Back Required Braces or Orthoses: Spinal Brace Spinal Brace: Lumbar corset, Applied in sitting position Restrictions Weight Bearing Restrictions: No     Therapy/Group: Individual Therapy  Viona Gilmore 05/11/2020, 4:53 PM

## 2020-05-11 NOTE — Progress Notes (Signed)
Physical Therapy Session Note  Patient Details  Name: [name] MRN: [id number] Date of Birth: Jul 24, 1929  Today's Date: 05/11/2020 PT Individual Time: 3474-2595 and 1100-1154  PT Individual Time Calculation (min): 68 min and 54 min  Short Term Goals: Week 1:  PT Short Term Goal 1 (Week 1): pt will perform bed mobility with min A consistantly PT Short Term Goal 1 - Progress (Week 1): Met PT Short Term Goal 2 (Week 1): pt will transfer bed<>WC with LRAD min A PT Short Term Goal 2 - Progress (Week 1): Met PT Short Term Goal 3 (Week 1): Pt will ambulate 46f with LRAD CGA PT Short Term Goal 3 - Progress (Week 1): Met Week 2:  PT Short Term Goal 1 (Week 2): STG=LTG due to LOS  Skilled Therapeutic Interventions/Progress Updates:   Treatment Session 1: 06387-564368 min Received pt supine in bed, pt agreeable to therapy, and reported pain 7/10 in low back (premedicated). Repositioning, rest breaks, and distraction done to reduce pain levels. Session with emphasis on functional mobility/transfers, generalized strengthening, dynamic standing balance/coordination, ambulation, and improved activity tolerance. Donned bilateral ted hose and shoes total A and pt transferred supine<>sitting EOB with HOB elevated and use of bedrails and supervision. Pt verbose and upset stating that she couldn't get in touch with her husband yesterday and he ended up in the ER from a swollen ankle limiting his ability to walk. Therapist provided emotional support and encouragement. Pt stated someone brought her husband to visit her yesterday afternoon and that it made her day. Donned LSO sitting EOB with total A and transferred bed<>WC stand<>pivot with RW and CGA. RN present to administer medications. Pt ambulated 1077fx 2 trials with RW and close supervision to/from dayroom. Pt performed the following exercises standing with BUE support on table and CGA for balance: -alternating marching 2x20 with 2lb ankle  weights -hip abduction 2x10 bilaterally with 2lb ankle weights -mini-squats 3x10 -hamstring curls 2x10 bilaterally with 2lb ankle weights  -hip extension 2x10 bilaterally with 2lb ankle weights Pt concerned with discharge date stating her daughter will be getting her second eye surgery that date and wouldn't be available to pick her up. Therapist suggested moving discharge date up to Tuesday and pt stated "I think that will work." CSCrows Nestotified. Worked on dyanmic standing balance tossing ball against wall and catching it without UE support and CGA for balance 2x10 reps. Concluded session with pt sitting in recliner, needs within reach, and chair pad alarm on.   Treatment Session 2: 113295-18844 min Received pt sitting in recliner, pt agreeable to therapy, and reported pain 7/10 in low back (premedicated). Repositioning, rest breaks, and distraction done to reduce pain levels. Session with emphasis on functional mobility/transfers, generalized strengthening, dynamic standing balance/coordination, ambulation, and improved activity tolerance. Pt ambulated >15046fith RW and close supervision to gym. Pt continues to demonstrate narrow BOS, decreased stride length, decreased trunk rotation, and decreased weight shifting to LLE. Pt performed BUE and BLE strengthening on Nustep at workload 4 for 10.5 minutes for a total of 339 steps improved cardiovascular endurance with 1 rest break. Pt ambulated additional [id number] RW and supervision to mat and worked on dynamic standing balance bowling with RUE x 1 game with 1 UE support on RW and CGA for balance. Pt requested to finish game sitting down and required multiple rest breaks throughout session due to fatigue and pain. Pt transferred sit<>stand without UE support and min A x 4 trials with cues  for anterior weight shifting. Pt ambulated additional 175f with RW and supervision back to room. Concluded session with pt sitting in recliner, needs within reach, and chair  pad alarm on.   Therapy Documentation Precautions:  Precautions Precautions: Fall, Back Required Braces or Orthoses: Spinal Brace Spinal Brace: Lumbar corset, Applied in sitting position Restrictions Weight Bearing Restrictions: No   Therapy/Group: Individual Therapy AAlfonse AlpersPT, DPT   05/11/2020, 7:32 AM

## 2020-05-12 ENCOUNTER — Inpatient Hospital Stay (HOSPITAL_COMMUNITY): Payer: Medicare Other | Admitting: Occupational Therapy

## 2020-05-12 ENCOUNTER — Inpatient Hospital Stay (HOSPITAL_COMMUNITY): Payer: Medicare Other | Admitting: Physical Therapy

## 2020-05-12 LAB — BASIC METABOLIC PANEL
Anion gap: 9 (ref 5–15)
BUN: 22 mg/dL (ref 8–23)
CO2: 31 mmol/L (ref 22–32)
Calcium: 9.4 mg/dL (ref 8.9–10.3)
Chloride: 95 mmol/L — ABNORMAL LOW (ref 98–111)
Creatinine, Ser: 1.32 mg/dL — ABNORMAL HIGH (ref 0.44–1.00)
GFR, Estimated: 39 mL/min — ABNORMAL LOW (ref >60)
Glucose, Bld: 142 mg/dL — ABNORMAL HIGH (ref 70–99)
Potassium: 3.8 mmol/L (ref 3.5–5.1)
Sodium: 135 mmol/L (ref 135–145)

## 2020-05-12 LAB — GLUCOSE, CAPILLARY
Glucose-Capillary: 109 mg/dL — ABNORMAL HIGH (ref 70–99)
Glucose-Capillary: 146 mg/dL — ABNORMAL HIGH (ref 70–99)
Glucose-Capillary: 106 mg/dL — ABNORMAL HIGH (ref 70–99)
Glucose-Capillary: 136 mg/dL — ABNORMAL HIGH (ref 70–99)

## 2020-05-12 MED ORDER — GABAPENTIN 400 MG PO CAPS
400.0000 mg | ORAL_CAPSULE | Freq: Every day | ORAL | Status: DC
  Administered 2020-05-12 – 2020-05-16 (×5): 400 mg via ORAL
  Filled 2020-05-12 (×5): qty 1

## 2020-05-12 NOTE — Progress Notes (Signed)
Occupational Therapy Session Note  Patient Details  Name: [name] MRN: [id number] Date of Birth: [birth year]  Today's Date: 05/12/2020 OT Individual Time: 7829-5621 OT Individual Time Calculation (min): 60 min    Short Term Goals: Week 2:  OT Short Term Goal 1 (Week 2): STG = LTGs due to remaining ELOS  Skilled Therapeutic Interventions/Progress Updates:    Treatment session with focus on self-care retraining with use of AE to maintain back precautions during LB dressing.  Pt received semi-reclined in bed reporting pain in back and not sleeping well overnight.  Pt agreeable to therapy session.  Engaged in [place] with focus on use of AE to adhere to back precautions.  Pt utilized reacher to [employer] and don pants while also threading catheter bag and tubing with supervision and setup for items.  Pt able to don shoes with setup with reaching.  Pt ambulated to sink with RW with CGA and completed oral care in standing at sink with supervision, demonstrating increased standing tolerance.  Pt required seated rest break after oral care.  Pt then engaged in functional mobility with RW with pt ambulating 64' with RW with CGA to close supervision.  Pt transferred to recliner with RW and remained upright with chair alarm on and all needs in reach.  Pt reports concern with going home next week.  She asked if she could go "somewhere else" before going home.  Pt reports that therapy says she is doing well, but she is concerned that her family can not provide for her.  Pt reports that her husband came to ED recently due to foot pain and her daughter has her own things.  Notified SWK of pt concerns.  Therapy Documentation Precautions:  Precautions Precautions: Fall, Back Required Braces or Orthoses: Spinal Brace Spinal Brace: Lumbar corset, Applied in sitting position Restrictions Weight Bearing Restrictions: No General:   Vital Signs: Therapy Vitals Temp: 97.7 F (36.5 C) Pulse Rate:  91 Resp: 17 BP: (!) 92/48 Patient Position (if appropriate): Lying Oxygen Therapy SpO2: 93 % O2 Device: Room Air Pain: Pain Assessment Pain Scale: 0-10 Pain Score: 9  Pain Type: Acute pain;Surgical pain Pain Descriptors / Indicators: Aching Pain Frequency: Constant Pain Onset: On-going Patients Stated Pain Goal: 2 Pain Intervention(s): Medication (See eMAR)   Therapy/Group: Individual Therapy  Simonne Come 05/12/2020, 3:23 PM

## 2020-05-12 NOTE — Plan of Care (Signed)
°  Problem: RH BOWEL ELIMINATION Goal: RH STG MANAGE BOWEL WITH ASSISTANCE Description: STG Manage Bowel with Assistance. Outcome: Progressing Goal: RH STG MANAGE BOWEL W/MEDICATION W/ASSISTANCE Description: STG Manage Bowel with Medication with Assistance. Outcome: Progressing   Problem: RH SKIN INTEGRITY Goal: RH STG SKIN FREE OF INFECTION/BREAKDOWN Outcome: Progressing Goal: RH STG MAINTAIN SKIN INTEGRITY WITH ASSISTANCE Description: STG Maintain Skin Integrity With Assistance. Outcome: Progressing Goal: RH STG ABLE TO PERFORM INCISION/WOUND CARE W/ASSISTANCE Description: STG Able To Perform Incision/Wound Care With Assistance. Outcome: Progressing

## 2020-05-12 NOTE — Progress Notes (Signed)
Physical Therapy Session Note  Patient Details  Name: [name] MRN: [id number] Date of Birth: Feb 07, 1930  Today's Date: 05/12/2020 PT Individual Time: 1010-1104 and 1350-1443 PT Individual Time Calculation (min): 54 min and 53 min  Short Term Goals: Week 2:  PT Short Term Goal 1 (Week 2): STG=LTG due to LOS  Skilled Therapeutic Interventions/Progress Updates:    Session 1: Pt received sitting in recliner wearing lumbar corset and agreeable to therapy session. Pt reports that she does not feel ready for D/C in 5 days stating that she doesn't feel that she can perform her necessary ADLs such as getting ready in the morning and cooking. States "I'm standing and they think that's great but they don't know how it feels" - with additional questions pt reports concern regarding her pain levels. Sit>stand recliner>RW with CGA for steadying and delayed hip/knee extension. Gait training ~153ft to main therapy gym using RW, with CGA for safety, and w/c follow in event of fatigue - demos slow gait speed, increased trunk/hip flexion, decreased B LE foot clearance, and decreased step lengths. Reports 9/10 pain in low back during ambulation that decreases to 8/10 with seated rest break - declines medication - provided rest breaks, repositioning, distraction, and emotional support for pain management. Performed repeated sit<>stands to/from EOM using B UE support on RW with CGA for steadying x10 reps - reports this is "tiring" but no increased pain. Performed repeated R/L LE foot taps on 6" step using B UE support on RW x10 each LE with CGA for steadying and cuing for increased L hip/knee extension progressed to alternating foot taps x10 reps . Progressed to dynamic standing balance task of alternate B LE foot taps with simultaneous contralateral hand reaches to external target with CGA for steadying - pt continues to demo slight L hip/knee flexion with favoring due to pain during stance, cuing for improvement.  Dynamic gait training via R/L lateral side stepping ~45ft down/back at hallway rail with B UE support - min assist for balance and cuing/facilitation for increased L hip abductor activation. Gait training ~156ft back to room using RW with CGA for steadying - continued cuing for improvement of above gait impairments. Pt left seated in recliner with needs in reach and chair alarm on.  Session 2: Pt received sitting in recliner wearing lumbar corset with her daughter, [name], present. Pt appears upset stating that her husband fell down at home this morning and had to have emergency services come help him but that he is now back at home, safe. Pt reports fatigued but agreeable to participate in therapy session. Reports need to use bathroom - then once I bathroom states that she already had incontinent BM in brief. Sit>stand recliner>RW with CGA for steadying and again pt demos delayed hip/knee flexion with slight knee give coming to stand. Gait ~37ft to bathroom using RW with CGA for steadying - continues to demo excessive trunk/hip flexion, decreased gait speed with decreased B LE step lengths. Standing using UE support on RW with CGA, performed LB clothing management max assist to manage soiled brief. Pt unaware that she is voiding additional BM on toilet and starts wiping using assistive equipment - reports she is unable to feel sensation of voiding - Trish, RN made aware - pt requires increased time to void completely due to this and therapist performed total assist peri-care for cleanliness. Multiple sit<>stands BSC<>RW with CGA for steadying during toileting. Gait ~82ft using RW to sink with CGA and pt performed standing hand  hygiene with close supervision for balance safety. Transported to/from gym in w/c for time management and energy conservation. Gait training ~142ft using RW with CGA - continued cuing for increased trunk/hip extension, increased L glute and hip abductor activation during stance phase - pt  required seated rest break prior to ambulating additional ~55ft back to w/c. Transported back to room. Gait ~67ft to recliner using RW with CGA. Pt left seated in recliner with needs in reach, chair alarm on, and pt's daughter present.  Therapy Documentation Precautions:  Precautions Precautions: Fall, Back Required Braces or Orthoses: Spinal Brace Spinal Brace: Lumbar corset, Applied in sitting position Restrictions Weight Bearing Restrictions: No  Pain: Session 1: Details above.  Session 2: Reports low back pain, unrated, but RN reports medication not due until end of therapy session - therapist provided rest breaks and repositioning for pain management.    Therapy/Group: Individual Therapy  Tawana Scale , PT, DPT, CSRS  05/12/2020, 7:53 AM

## 2020-05-12 NOTE — Progress Notes (Signed)
[facility] PHYSICAL MEDICINE & REHABILITATION PROGRESS NOTE  Subjective/Complaints: Did not sleep well last night despite Gabapentin 300mg  HS. Discussed increasing dose to 400mg  and she is agreeable. Low back pain kept her up. Discussed kpad and she is agreeable.   ROS: Denies CP, SOB, N/V/D  Objective: Vital Signs: Blood pressure (!) 111/57, pulse 96, temperature (!) 97.5 F (36.4 C), temperature source Oral, resp. rate 19, height 5\' 2"  (1.575 m), weight 64.5 kg, SpO2 98 %. No results found. No results for input(s): WBC, HGB, HCT, PLT in the last 72 hours. Recent Labs    05/10/20 0413 05/12/20 0440  NA 134* 135  K 3.4* 3.8  CL 93* 95*  CO2 32 31  GLUCOSE 144* 142*  BUN 16 22  CREATININE 1.34* 1.32*  CALCIUM 8.9 9.4    Intake/Output Summary (Last 24 hours) at 05/12/2020 0958 Last data filed at 05/12/2020 0600 Gross per 24 hour  Intake 480 ml  Output 2325 ml  Net -1845 ml        Physical Exam: BP (!) 111/57 (BP Location: Left Arm)   Pulse 96   Temp (!) 97.5 F (36.4 C) (Oral)   Resp 19   Ht 5\' 2"  (1.575 m)   Wt 64.5 kg   SpO2 98%   BMI 26.01 kg/m  General: Alert and oriented x 3, No apparent distress HEENT: Head is normocephalic, atraumatic, PERRLA, EOMI, sclera anicteric, oral mucosa pink and moist, dentition intact, ext ear canals clear,  Neck: Supple without JVD or lymphadenopathy Heart: Reg rate and rhythm. No murmurs rubs or gallops Chest: CTA bilaterally without wheezes, rales, or rhonchi; no distress Abdomen: Soft, non-tender, non-distended, bowel sounds positive. Extremities: No clubbing, cyanosis, or edema. Pulses are 2+ Skin: Warm and dry.  Back incision with dressing CDI Psych: Normal mood.  Normal behavior. Musc: No edema in extremities.  No tenderness in extremities. GU: + Foley Neuro:  Alert and oriented. HOH Motor: B/l UE 5/5. B/l LE: 3+/5 HF, 3+/5 KE and 4/5 ADF/PF, with bilateral pain inhibition   Assessment/Plan: 1. Functional deficits  secondary to lumbar stenosis with neurogenic claudication s/p redo laminectomy with decompression b/l L3-5 which require 3+ hours per day of interdisciplinary therapy in a comprehensive inpatient rehab setting.  Physiatrist is providing close team supervision and 24 hour management of active medical problems listed below.  Physiatrist and rehab team continue to assess barriers to discharge/monitor patient progress toward functional and medical goals   Care Tool:  Bathing    Body parts bathed by patient: Right arm, Left arm, Chest, Abdomen, Face, Front perineal area, Buttocks, Right upper leg, Left upper leg   Body parts bathed by helper: Right lower leg, Left lower leg     Bathing assist Assist Level: Minimal Assistance - Patient > 75%     Upper Body Dressing/Undressing Upper body dressing   What is the patient wearing?: Pull over shirt, Orthosis    Upper body assist Assist Level: Minimal Assistance - Patient > 75%    Lower Body Dressing/Undressing Lower body dressing      What is the patient wearing?: Pants     Lower body assist Assist for lower body dressing: Contact Guard/Touching assist (with reacher)     Toileting Toileting    Toileting assist Assist for toileting: Moderate Assistance - Patient 50 - 74% (more assist d/t help cleaning BM)     Transfers Chair/bed transfer  Transfers assist     Chair/bed transfer assist level: Contact Guard/Touching assist  Locomotion Ambulation   Ambulation assist      Assist level: Supervision/Verbal cueing Assistive device: Walker-rolling Max distance: 174ft   Walk 10 feet activity   Assist     Assist level: Supervision/Verbal cueing Assistive device: Walker-rolling   Walk 50 feet activity   Assist Walk 50 feet with 2 turns activity did not occur: Safety/medical concerns (fatigue, pain, generalized weakness)  Assist level: Supervision/Verbal cueing Assistive device: Walker-rolling    Walk 150  feet activity   Assist Walk 150 feet activity did not occur: Safety/medical concerns (fatigue, pain, generalized weakness)  Assist level: Contact Guard/Touching assist Assistive device: Walker-rolling    Walk 10 feet on uneven surface  activity   Assist Walk 10 feet on uneven surfaces activity did not occur: Safety/medical concerns (fatigue, pain, generalized weakness)         Wheelchair     Assist Will patient use wheelchair at discharge?: Yes Type of Wheelchair: Manual    Wheelchair assist level: Supervision/Verbal cueing Max wheelchair distance: 91ft    Wheelchair 50 feet with 2 turns activity    Assist        Assist Level: Moderate Assistance - Patient 50 - 74%   Wheelchair 150 feet activity     Assist      Assist Level: Total Assistance - Patient < 25%    Medical Problem List and Plan: 1.Functional and mobility deficitssecondary to lumbar stenosis with neurogenic claudication, s/p re-do laminectomy with decompression of bilateral L3,L4,L5 nerve roots on 04/24/20.  Continue CIR 2. Antithrombotics: -DVT/anticoagulation:Mechanical:Sequential compression devices, below kneeBilateral lower extremities -antiplatelet therapy: N/A 3. Pain Management:  Cont Gabapentin 300mg  daily qhs  Continue Oxycontin mg  15 BID, DC'd on 11/2  Cont tramadol 50 TID  Cont Percocet 5 q4 PRN  Lidocaine patch ordered  D/cedVicodin per report of confusion  Lidoderm patch ordered for back  Behavioral component as well  Controlled when not being asked or distracted on 11/4  Add heating pad for lower back 4. Mood:LCSW to follow for evaluation and support. -antipsychotic agents: N/A 5. Neuropsych: This patientis not fullycapable of making decisions on hisown behalf. 6. Skin/Wound Care:Monitor wound for healing 7. Fluids/Electrolytes/Nutrition:Monitor I/Os. 8. T2DM with hyperglycemia: On glucotrol 10 mg am/5 mg pm--monitor for  hypoglycemic episodes with rise in SCr. Will Monitor BS ac/hs and use SSI for elevated BS.  Remains slightly labile, but overall controlled on 11/4  Monitor with increased exertion 9.Acute on chronic anemia:   Continue iron supplement bid  Monitor for signs of bleeding.   Hb 8.4 on 11/1  Cont to monitor 10. CKD stage III: Baseline SCr-1.43-->1.54  Cr.  1.34 on 11/3, 1.32 on 11/5 11. Flaccid neurogenic bladder. Patient with chronic Foley tubesince 2017 back surgery.Changed chronic Foley on 11/4--gets foley change monthly at [facility] u[facility]. 12.Chronic BLE edema/Hypertension.Monitor BP -continue Lasix 40 mg daily.   Blood pressure controlled on 11/4  Monitor with increased mobility 13. GERD: managed by protonix, changed timing per patient preference 14. Hyperlipidemia.Continue Lipitor 15. Chronic constipation:  KUB with constipation  MiraLAX DC'd on 11/2  Movantik ordered on 11/1, increased on 11/4 16. Hyponatremia  Na 134 on 11/3 17. Hypoalbuminemia  Supplement initiated on 10/27 18.  Hypokalemia  Potassium 3.4 on 11/3, 3.8 on 11/5  Supplemented x1 day  LOS: 10 days A FACE TO FACE EVALUATION WAS PERFORMED  Clide Deutscher Masaru Chamberlin 05/12/2020, 9:58 AM

## 2020-05-12 NOTE — Progress Notes (Signed)
Patient ID: [name], female   DOB: [birth year], 86 y.o.   MRN: [id number]  Met with pt who expressed concerns about going home and being ready. Discussed the alternative was going to a SNF and she doesn't really want to go to one of these either. Discussed home health will be coming out and she will need to push herself to move around at home and not just sit. She reports her husband came into the ER yesterday for his ankle pain. He is home and has pain meds to take for this. Pt is aware will have some pain meds at home. She has decided to try it at home and see how they do. She is at a supervision level. Will continue to provide support and pt will also talk with daughter this evening to see what she thinks. Follow up with Monday.

## 2020-05-13 ENCOUNTER — Inpatient Hospital Stay (HOSPITAL_COMMUNITY): Payer: Medicare Other | Admitting: Physical Therapy

## 2020-05-13 ENCOUNTER — Inpatient Hospital Stay (HOSPITAL_COMMUNITY): Payer: Medicare Other | Admitting: Occupational Therapy

## 2020-05-13 LAB — GLUCOSE, CAPILLARY
Glucose-Capillary: 107 mg/dL — ABNORMAL HIGH (ref 70–99)
Glucose-Capillary: 108 mg/dL — ABNORMAL HIGH (ref 70–99)
Glucose-Capillary: 176 mg/dL — ABNORMAL HIGH (ref 70–99)
Glucose-Capillary: 115 mg/dL — ABNORMAL HIGH (ref 70–99)

## 2020-05-13 MED ORDER — CHLORHEXIDINE GLUCONATE CLOTH 2 % EX PADS
6.0000 | MEDICATED_PAD | Freq: Two times a day (BID) | CUTANEOUS | Status: DC
  Administered 2020-05-13 – 2020-05-17 (×8): 6 via TOPICAL

## 2020-05-13 NOTE — Progress Notes (Signed)
[place] PHYSICAL MEDICINE & REHABILITATION PROGRESS NOTE  Subjective/Complaints: Slept well last night Discussed her family support Has been using IS BP soft  ROS: denies CP, SOB, N/V/D  Objective: Vital Signs: Blood pressure (!) 119/59, pulse 95, temperature 98 F (36.7 C), resp. rate 17, height 5\' 2"  (1.575 m), weight 64.5 kg, SpO2 94 %. No results found. No results for input(s): WBC, HGB, HCT, PLT in the last 72 hours. Recent Labs    05/12/20 0440  NA 135  K 3.8  CL 95*  CO2 31  GLUCOSE 142*  BUN 22  CREATININE 1.32*  CALCIUM 9.4    Intake/Output Summary (Last 24 hours) at 05/13/2020 1032 Last data filed at 05/13/2020 0720 Gross per 24 hour  Intake 576 ml  Output 1100 ml  Net -524 ml    Physical Exam: BP (!) 119/59 (BP Location: Left Arm)   Pulse 95   Temp 98 F (36.7 C)   Resp 17   Ht 5\' 2"  (1.575 m)   Wt 64.5 kg   SpO2 94%   BMI 26.01 kg/m   General: Alert and oriented x 3, No apparent distress HEENT: Head is normocephalic, atraumatic, PERRLA, EOMI, sclera anicteric, oral mucosa pink and moist, dentition intact, ext ear canals clear,  Neck: Supple without JVD or lymphadenopathy Heart: Reg rate and rhythm. No murmurs rubs or gallops Chest: CTA bilaterally without wheezes, rales, or rhonchi; no distress Abdomen: Soft, non-tender, non-distended, bowel sounds positive. Extremities: No clubbing, cyanosis, or edema. Pulses are 2+  Skin: Warm and dry.  Back incision with dressing CDI Psych: Normal mood.  Normal behavior. Musc: No edema in extremities.  No tenderness in extremities. GU: + Foley Neuro:  Alert and oriented. HOH Motor: B/l UE 5/5. B/l LE: 3+/5 HF, 3+/5 KE and 4/5 ADF/PF, with bilateral pain inhibition   Assessment/Plan: 1. Functional deficits secondary to lumbar stenosis with neurogenic claudication s/p redo laminectomy with decompression b/l L3-5 which require 3+ hours per day of interdisciplinary therapy in a comprehensive inpatient  rehab setting.  Physiatrist is providing close team supervision and 24 hour management of active medical problems listed below.  Physiatrist and rehab team continue to assess barriers to discharge/monitor patient progress toward functional and medical goals   Care Tool:  Bathing    Body parts bathed by patient: Right arm, Left arm, Chest, Abdomen, Face, Front perineal area, Buttocks, Right upper leg, Left upper leg   Body parts bathed by helper: Right lower leg, Left lower leg     Bathing assist Assist Level: Minimal Assistance - Patient > 75%     Upper Body Dressing/Undressing Upper body dressing   What is the patient wearing?: Pull over shirt, Orthosis    Upper body assist Assist Level: Minimal Assistance - Patient > 75%    Lower Body Dressing/Undressing Lower body dressing      What is the patient wearing?: Pants     Lower body assist Assist for lower body dressing: Supervision/Verbal cueing     Toileting Toileting    Toileting assist Assist for toileting: Moderate Assistance - Patient 50 - 74% (more assist d/t help cleaning BM)     Transfers Chair/bed transfer  Transfers assist     Chair/bed transfer assist level: Contact Guard/Touching assist Chair/bed transfer assistive device: Programmer, multimedia   Ambulation assist      Assist level: Contact Guard/Touching assist Assistive device: Walker-rolling Max distance: 129ft   Walk 10 feet activity   Assist  Assist level: Contact Guard/Touching assist Assistive device: Walker-rolling   Walk 50 feet activity   Assist Walk 50 feet with 2 turns activity did not occur: Safety/medical concerns (fatigue, pain, generalized weakness)  Assist level: Contact Guard/Touching assist Assistive device: Walker-rolling    Walk 150 feet activity   Assist Walk 150 feet activity did not occur: Safety/medical concerns (fatigue, pain, generalized weakness)  Assist level: Contact  Guard/Touching assist Assistive device: Walker-rolling    Walk 10 feet on uneven surface  activity   Assist Walk 10 feet on uneven surfaces activity did not occur: Safety/medical concerns (fatigue, pain, generalized weakness)         Wheelchair     Assist Will patient use wheelchair at discharge?: Yes Type of Wheelchair: Manual    Wheelchair assist level: Supervision/Verbal cueing Max wheelchair distance: 28ft    Wheelchair 50 feet with 2 turns activity    Assist        Assist Level: Moderate Assistance - Patient 50 - 74%   Wheelchair 150 feet activity     Assist      Assist Level: Total Assistance - Patient < 25%    Medical Problem List and Plan: 1.Functional and mobility deficitssecondary to lumbar stenosis with neurogenic claudication, s/p re-do laminectomy with decompression of bilateral L3,L4,L5 nerve roots on 04/24/20.  Continue CIR 2. Antithrombotics: -DVT/anticoagulation:Mechanical:Sequential compression devices, below kneeBilateral lower extremities -antiplatelet therapy: N/A 3. Pain Management:  Cont Gabapentin 300mg  daily qhs  Continue Oxycontin mg  15 BID, DC'd on 11/2  Cont tramadol 50 TID  Cont Percocet 5 q4 PRN  Lidocaine patch ordered  D/cedVicodin per report of confusion  Lidoderm patch ordered for back  Behavioral component as well  Controlled when not being asked or distracted on 11/4  Add heating pad for lower back  Pain is stable 4. Mood:LCSW to follow for evaluation and support. -antipsychotic agents: N/A 5. Neuropsych: This patientis not fullycapable of making decisions on hisown behalf. 6. Skin/Wound Care:Monitor wound for healing 7. Fluids/Electrolytes/Nutrition:Monitor I/Os. 8. T2DM with hyperglycemia: On glucotrol 10 mg am/5 mg pm--monitor for hypoglycemic episodes with rise in SCr. Will Monitor BS ac/hs and use SSI for elevated BS.  Controlled 11/5  Monitor with  increased exertion 9.Acute on chronic anemia:   Continue iron supplement bid  Monitor for signs of bleeding.   Hb 8.4 on 11/1  Cont to monitor 10. CKD stage III: Baseline SCr-1.43-->1.54  Cr.  1.34 on 11/3, 1.32 on 11/5 11. Flaccid neurogenic bladder. Patient with chronic Foley tubesince 2017 back surgery.Changed chronic Foley on 11/4--gets foley change monthly at [facility]. 12.Chronic BLE edema/Hypertension.Monitor BP -continue Lasix 40 mg daily.   BP controlled 11/5  Monitor with increased mobility 13. GERD: managed by protonix, changed timing per patient preference 14. Hyperlipidemia.Continue Lipitor 15. Chronic constipation:  KUB with constipation  MiraLAX DC'd on 11/2  Movantik ordered on 11/1, increased on 11/4  Had BM 11/5 16. Hyponatremia  Na 134 on 11/3 17. Hypoalbuminemia  Supplement initiated on 10/27 18.  Hypokalemia  Potassium 3.4 on 11/3, 3.8 on 11/5  Supplemented x1 day  LOS: 11 days A FACE TO FACE EVALUATION WAS PERFORMED  Clide Deutscher Liddie Chichester 05/13/2020, 10:32 AM

## 2020-05-13 NOTE — Progress Notes (Signed)
Physical Therapy Session Note  Patient Details  Name: [name] MRN: [id number] Date of Birth: 1929/11/01  Today's Date: 05/13/2020 PT Individual Time: 0915-0955 PT Individual Time Calculation (min): 40 min   Short Term Goals: Week 1:  PT Short Term Goal 1 (Week 1): pt will perform bed mobility with min A consistantly PT Short Term Goal 1 - Progress (Week 1): Met PT Short Term Goal 2 (Week 1): pt will transfer bed<>WC with LRAD min A PT Short Term Goal 2 - Progress (Week 1): Met PT Short Term Goal 3 (Week 1): Pt will ambulate 15f with LRAD CGA PT Short Term Goal 3 - Progress (Week 1): Met Week 2:  PT Short Term Goal 1 (Week 2): STG=LTG due to LOS      Skilled Therapeutic Interventions/Progress Updates:    pt received in bed and agreeable to therapy. Pt directed in supine>sit from flat bed, CGA with log rolling technique completed. Pt sat EOB with CGA for balance, PT assisted pt in donning B TED hose at total A and min A for shoes, min A for back brace donning in sitting as well. Pt directed in Sit to stand from bed to Rolling walker CGA, gait training with Rolling walker for 265' with 2 standing rest breaks, CGA with VC for trunk extension walker proximity and increased stride length. Sitting rest break post gait training. Pt directed in standing BLE strengthening exercises of hip extension and abduction x15 each. Requested rest break post each in sitting 2.2 fatigue. Pt directed in gait to return to room, 150' CGA with Rolling walker similar VC and pt requested to rest in recliner. Pt setup in recliner, tray in front, alarm set, All needs in reach and in good condition. Call light in hand.  Pt reported slight back pain at start of session but reported nursing had already given her medication.   Therapy Documentation Precautions:  Precautions Precautions: Fall, Back Required Braces or Orthoses: Spinal Brace Spinal Brace: Lumbar corset, Applied in sitting  position Restrictions Weight Bearing Restrictions: No General:   Vital Signs:  Pain: Pain Assessment Pain Scale: 0-10 Pain Score: 7  Pain Location: Back Pain Orientation: Lower Pain Intervention(s): Repositioned;Rest;Medication (See eMAR)    Therapy/Group: Individual Therapy  HJunie Panning11/12/2019, 10:54 AM

## 2020-05-13 NOTE — Progress Notes (Signed)
Occupational Therapy Session Note  Patient Details  Name: BELINDA SCHLICHTING MRN: [id number] Date of Birth: [birth year]  Today's Date: 05/13/2020 OT Individual Time: 1300-1345 OT Individual Time Calculation (min): 45 min    Short Term Goals: Week 2:  OT Short Term Goal 1 (Week 2): STG = LTGs due to remaining ELOS  Skilled Therapeutic Interventions/Progress Updates:  Patient met seated in recliner in agreement with OT treatment session. 8/10 pain in low back. Patient reports premedication 15 minutes prior to start of session. Patient declined bathing/dressing tasks. Functional mobility from room to door outside of therapy gym with close supervision A and use of RW. Wc transport to hospital gift shop for community reintegration. Functional mobility around shop with use of RW and close supervision A. Patient able to recall 3/3 items and identify in gift shop. Wc transport to outdoor area near hospital atrium. Patient with concern about return home. OT provided education on safety with functional mobility and recommendation for supervision A with BADLs including bathing and dressing. Patient in agreement. Therapeutic exercise with use of arm ergometer for 5 min on level 1.5 with focus on endurance. Return to room via wc with Total A for time management and energy conservation. Session concluded with patient seated in wc with call bell within reach, chair alarm activated, and all needs met.    Therapy Documentation Precautions:  Precautions Precautions: Fall, Back Required Braces or Orthoses: Spinal Brace Spinal Brace: Lumbar corset, Applied in sitting position Restrictions Weight Bearing Restrictions: No General:    Therapy/Group: Individual Therapy  Shriyan Arakawa R [place]Davis 05/13/2020, 12:54 PM

## 2020-05-14 LAB — GLUCOSE, CAPILLARY
Glucose-Capillary: 109 mg/dL — ABNORMAL HIGH (ref 70–99)
Glucose-Capillary: 108 mg/dL — ABNORMAL HIGH (ref 70–99)
Glucose-Capillary: 155 mg/dL — ABNORMAL HIGH (ref 70–99)
Glucose-Capillary: 139 mg/dL — ABNORMAL HIGH (ref 70–99)

## 2020-05-14 NOTE — Progress Notes (Signed)
[place] PHYSICAL MEDICINE & REHABILITATION PROGRESS NOTE  Subjective/Complaints: She is concerned about going home and who will help her as her husband is sick. She is concerned about who will provide her support in getting to the bathroom and with her incontinence BP soft  ROS: denies CP, SOB, N/V/D  Objective: Vital Signs: Blood pressure (!) 110/46, pulse 91, temperature 97.9 F (36.6 C), temperature source Oral, resp. rate 16, height 5\' 2"  (1.575 m), weight 64.5 kg, SpO2 100 %. No results found. No results for input(s): WBC, HGB, HCT, PLT in the last 72 hours. Recent Labs    05/12/20 0440  NA 135  K 3.8  CL 95*  CO2 31  GLUCOSE 142*  BUN 22  CREATININE 1.32*  CALCIUM 9.4    Intake/Output Summary (Last 24 hours) at 05/14/2020 1149 Last data filed at 05/14/2020 1035 Gross per 24 hour  Intake 616 ml  Output 2600 ml  Net -1984 ml    Physical Exam: BP (!) 110/46 (BP Location: Left Arm)   Pulse 91   Temp 97.9 F (36.6 C) (Oral)   Resp 16   Ht 5\' 2"  (1.575 m)   Wt 64.5 kg   SpO2 100%   BMI 26.01 kg/m    General: Alert and oriented x 3, No apparent distress HEENT: Head is normocephalic, atraumatic, PERRLA, EOMI, sclera anicteric, oral mucosa pink and moist, dentition intact, ext ear canals clear,  Neck: Supple without JVD or lymphadenopathy Heart: Reg rate and rhythm. No murmurs rubs or gallops Chest: CTA bilaterally without wheezes, rales, or rhonchi; no distress Abdomen: Soft, non-tender, non-distended, bowel sounds positive. Extremities: No clubbing, cyanosis, or edema. Pulses are 2+  Skin: Warm and dry.  Back incision with dressing CDI Psych: Normal mood.  Normal behavior. Musc: No edema in extremities.  No tenderness in extremities. GU: + Foley Neuro:  Alert and oriented. HOH Motor: B/l UE 5/5. B/l LE: 3+/5 HF, 3+/5 KE and 4/5 ADF/PF, with bilateral pain inhibition   Assessment/Plan: 1. Functional deficits secondary to lumbar stenosis with  neurogenic claudication s/p redo laminectomy with decompression b/l L3-5 which require 3+ hours per day of interdisciplinary therapy in a comprehensive inpatient rehab setting.  Physiatrist is providing close team supervision and 24 hour management of active medical problems listed below.  Physiatrist and rehab team continue to assess barriers to discharge/monitor patient progress toward functional and medical goals   Care Tool:  Bathing    Body parts bathed by patient: Right arm, Left arm, Chest, Abdomen, Face, Front perineal area, Buttocks, Right upper leg, Left upper leg   Body parts bathed by helper: Right lower leg, Left lower leg     Bathing assist Assist Level: Minimal Assistance - Patient > 75%     Upper Body Dressing/Undressing Upper body dressing   What is the patient wearing?: Pull over shirt, Orthosis    Upper body assist Assist Level: Minimal Assistance - Patient > 75%    Lower Body Dressing/Undressing Lower body dressing      What is the patient wearing?: Pants     Lower body assist Assist for lower body dressing: Supervision/Verbal cueing     Toileting Toileting    Toileting assist Assist for toileting: Moderate Assistance - Patient 50 - 74% (more assist d/t help cleaning BM)     Transfers Chair/bed transfer  Transfers assist     Chair/bed transfer assist level: Contact Guard/Touching assist Chair/bed transfer assistive device: Programmer, multimedia   Ambulation assist  Assist level: Contact Guard/Touching assist Assistive device: Walker-rolling Max distance: 166ft   Walk 10 feet activity   Assist     Assist level: Contact Guard/Touching assist Assistive device: Walker-rolling   Walk 50 feet activity   Assist Walk 50 feet with 2 turns activity did not occur: Safety/medical concerns (fatigue, pain, generalized weakness)  Assist level: Contact Guard/Touching assist Assistive device: Walker-rolling    Walk 150  feet activity   Assist Walk 150 feet activity did not occur: Safety/medical concerns (fatigue, pain, generalized weakness)  Assist level: Contact Guard/Touching assist Assistive device: Walker-rolling    Walk 10 feet on uneven surface  activity   Assist Walk 10 feet on uneven surfaces activity did not occur: Safety/medical concerns (fatigue, pain, generalized weakness)         Wheelchair     Assist Will patient use wheelchair at discharge?: Yes Type of Wheelchair: Manual    Wheelchair assist level: Supervision/Verbal cueing Max wheelchair distance: 51ft    Wheelchair 50 feet with 2 turns activity    Assist        Assist Level: Moderate Assistance - Patient 50 - 74%   Wheelchair 150 feet activity     Assist      Assist Level: Total Assistance - Patient < 25%    Medical Problem List and Plan: 1.Functional and mobility deficitssecondary to lumbar stenosis with neurogenic claudication, s/p re-do laminectomy with decompression of bilateral L3,L4,L5 nerve roots on 04/24/20.  Continue CIR 2. Antithrombotics: -DVT/anticoagulation:Mechanical:Sequential compression devices, below kneeBilateral lower extremities -antiplatelet therapy: N/A 3. Pain Management:  Cont Gabapentin 300mg  daily qhs  Continue Oxycontin mg  15 BID, DC'd on 11/2  Cont tramadol 50 TID  Cont Percocet 5 q4 PRN  Lidocaine patch ordered  D/cedVicodin per report of confusion  Lidoderm patch ordered for back  Behavioral component as well  Controlled when not being asked or distracted on 11/4  Add heating pad for lower back  Pain is stable 4. Mood:LCSW to follow for evaluation and support. -antipsychotic agents: N/A 5. Neuropsych: This patientis not fullycapable of making decisions on hisown behalf. 6. Skin/Wound Care:Monitor wound for healing 7. Fluids/Electrolytes/Nutrition:Monitor I/Os. 8. T2DM with hyperglycemia: On glucotrol 10 mg am/5  mg pm--monitor for hypoglycemic episodes with rise in SCr. Will Monitor BS ac/hs and use SSI for elevated BS.  11/7: elevated to 176 last night. Continue to monitor  Monitor with increased exertion 9.Acute on chronic anemia:   Continue iron supplement bid  Monitor for signs of bleeding.   Hb 8.4 on 11/1  Cont to monitor 10. CKD stage III: Baseline SCr-1.43-->1.54  Cr.  1.34 on 11/3, 1.32 on 11/5 11. Flaccid neurogenic bladder. Patient with chronic Foley tubesince 2017 back surgery.Changed chronic Foley on 11/4--gets foley change monthly at [facility] u[facility]. 12.Chronic BLE edema/Hypertension.Monitor BP -continue Lasix 40 mg daily.   BP controlled 11/5  Monitor with increased mobility 13. GERD: managed by protonix, changed timing per patient preference 14. Hyperlipidemia.Continue Lipitor 15. Chronic constipation:  KUB with constipation  MiraLAX DC'd on 11/2  Movantik ordered on 11/1, increased on 11/4  Had BM 11/5 16. Hyponatremia  Na 134 on 11/3 17. Hypoalbuminemia  Supplement initiated on 10/27 18.  Hypokalemia  Potassium 3.4 on 11/3, 3.8 on 11/5  Supplemented x1 day 19. Disposition: Patient is very concerned about discharge home given that her husband is sick and will not be able to help her much. Her granddaughter works full time and her daughter works half day but both will be  able to support her. Advised patient that she should request as much support from daughter and granddaughter and daughter as they can give at this time and consider hiring additional home help- it would be great if SW can discuss cost of this with patient. Discussed nursing home as option but patient does not prefer this and I agree with her given the family support that she does have.   LOS: 12 days A FACE TO FACE EVALUATION WAS PERFORMED  Clide Deutscher Santi Troung 05/14/2020, 11:49 AM

## 2020-05-15 ENCOUNTER — Inpatient Hospital Stay (HOSPITAL_COMMUNITY): Payer: Medicare Other | Admitting: Occupational Therapy

## 2020-05-15 ENCOUNTER — Inpatient Hospital Stay (HOSPITAL_COMMUNITY): Payer: Medicare Other

## 2020-05-15 LAB — CBC
HCT: 30.3 % — ABNORMAL LOW (ref 36.0–46.0)
Hemoglobin: 9.3 g/dL — ABNORMAL LOW (ref 12.0–15.0)
MCH: 28.8 pg (ref 26.0–34.0)
MCHC: 30.7 g/dL (ref 30.0–36.0)
MCV: 93.8 fL (ref 80.0–100.0)
Platelets: 399 10*3/uL (ref 150–400)
RBC: 3.23 MIL/uL — ABNORMAL LOW (ref 3.87–5.11)
RDW: 15.8 % — ABNORMAL HIGH (ref 11.5–15.5)
WBC: 4.6 10*3/uL (ref 4.0–10.5)
nRBC: 0 % (ref 0.0–0.2)

## 2020-05-15 LAB — BASIC METABOLIC PANEL
Anion gap: 9 (ref 5–15)
BUN: 25 mg/dL — ABNORMAL HIGH (ref 8–23)
CO2: 30 mmol/L (ref 22–32)
Calcium: 9.3 mg/dL (ref 8.9–10.3)
Chloride: 94 mmol/L — ABNORMAL LOW (ref 98–111)
Creatinine, Ser: 1.35 mg/dL — ABNORMAL HIGH (ref 0.44–1.00)
GFR, Estimated: 38 mL/min — ABNORMAL LOW (ref >60)
Glucose, Bld: 220 mg/dL — ABNORMAL HIGH (ref 70–99)
Potassium: 4.1 mmol/L (ref 3.5–5.1)
Sodium: 133 mmol/L — ABNORMAL LOW (ref 135–145)

## 2020-05-15 LAB — GLUCOSE, CAPILLARY
Glucose-Capillary: 113 mg/dL — ABNORMAL HIGH (ref 70–99)
Glucose-Capillary: 108 mg/dL — ABNORMAL HIGH (ref 70–99)
Glucose-Capillary: 177 mg/dL — ABNORMAL HIGH (ref 70–99)
Glucose-Capillary: 129 mg/dL — ABNORMAL HIGH (ref 70–99)

## 2020-05-15 NOTE — Progress Notes (Signed)
[facility] PHYSICAL MEDICINE & REHABILITATION PROGRESS NOTE  Subjective/Complaints: Discussed blood work with ptLabs reviewed BMET stable , Hgb improving  Pt is concerned about help at home post d/c , husband injured his foot and is awaiting xray results, daughter can assist but not 24/7 ROS: denies CP, SOB, N/V/D  Objective: Vital Signs: Blood pressure (!) 124/56, pulse 86, temperature 97.9 F (36.6 C), resp. rate 19, height 5\' 2"  (1.575 m), weight 64.5 kg, SpO2 96 %. No results found. Recent Labs    05/15/20 0812  WBC 4.6  HGB 9.3*  HCT 30.3*  PLT 399   Recent Labs    05/15/20 0812  NA 133*  K 4.1  CL 94*  CO2 30  GLUCOSE 220*  BUN 25*  CREATININE 1.35*  CALCIUM 9.3    Intake/Output Summary (Last 24 hours) at 05/15/2020 1025 Last data filed at 05/15/2020 0851 Gross per 24 hour  Intake 240 ml  Output 1400 ml  Net -1160 ml    Physical Exam: BP (!) 124/56 (BP Location: Left Arm)   Pulse 86   Temp 97.9 F (36.6 C)   Resp 19   Ht 5\' 2"  (1.575 m)   Wt 64.5 kg   SpO2 96%   BMI 26.01 kg/m    General: Alert and oriented x 3, No apparent distress HEENT: Head is normocephalic, atraumatic, PERRLA, EOMI, sclera anicteric, oral mucosa pink and moist, dentition intact, ext ear canals clear,  Neck: Supple without JVD or lymphadenopathy Heart: Reg rate and rhythm. No murmurs rubs or gallops Chest: CTA bilaterally without wheezes, rales, or rhonchi; no distress Abdomen: Soft, non-tender, non-distended, bowel sounds positive. Extremities: No clubbing, cyanosis, or edema. Pulses are 2+  Skin: Warm and dry.  Back incision with dressing CDI Psych: Normal mood.  Normal behavior. Musc: No edema in extremities.  No tenderness in extremities. GU: + Foley Neuro:  Alert and oriented. HOH Motor: B/l UE 5/5. B/l LE: 3+/5 HF, 3+/5 KE and 4/5 ADF/PF, with bilateral pain inhibition   Assessment/Plan: 1. Functional deficits secondary to lumbar stenosis with neurogenic  claudication s/p redo laminectomy with decompression b/l L3-5 which require 3+ hours per day of interdisciplinary therapy in a comprehensive inpatient rehab setting.  Physiatrist is providing close team supervision and 24 hour management of active medical problems listed below.  Physiatrist and rehab team continue to assess barriers to discharge/monitor patient progress toward functional and medical goals   Care Tool:  Bathing    Body parts bathed by patient: Right arm, Left arm, Chest, Abdomen, Face, Front perineal area, Buttocks, Right upper leg, Left upper leg   Body parts bathed by helper: Right lower leg, Left lower leg     Bathing assist Assist Level: Minimal Assistance - Patient > 75%     Upper Body Dressing/Undressing Upper body dressing   What is the patient wearing?: Pull over shirt, Orthosis    Upper body assist Assist Level: Minimal Assistance - Patient > 75%    Lower Body Dressing/Undressing Lower body dressing      What is the patient wearing?: Pants     Lower body assist Assist for lower body dressing: Supervision/Verbal cueing     Toileting Toileting    Toileting assist Assist for toileting: Moderate Assistance - Patient 50 - 74% (more assist d/t help cleaning BM)     Transfers Chair/bed transfer  Transfers assist     Chair/bed transfer assist level: Supervision/Verbal cueing Chair/bed transfer assistive device: Programmer, multimedia  Ambulation assist      Assist level: Supervision/Verbal cueing Assistive device: Walker-rolling Max distance: 246ft   Walk 10 feet activity   Assist     Assist level: Supervision/Verbal cueing Assistive device: Walker-rolling   Walk 50 feet activity   Assist Walk 50 feet with 2 turns activity did not occur: Safety/medical concerns (fatigue, pain, generalized weakness)  Assist level: Supervision/Verbal cueing Assistive device: Walker-rolling    Walk 150 feet activity   Assist  Walk 150 feet activity did not occur: Safety/medical concerns (fatigue, pain, generalized weakness)  Assist level: Supervision/Verbal cueing Assistive device: Walker-rolling    Walk 10 feet on uneven surface  activity   Assist Walk 10 feet on uneven surfaces activity did not occur: Safety/medical concerns (fatigue, pain, generalized weakness)         Wheelchair     Assist Will patient use wheelchair at discharge?: Yes Type of Wheelchair: Manual    Wheelchair assist level: Supervision/Verbal cueing Max wheelchair distance: 51ft    Wheelchair 50 feet with 2 turns activity    Assist        Assist Level: Moderate Assistance - Patient 50 - 74%   Wheelchair 150 feet activity     Assist      Assist Level: Total Assistance - Patient < 25%    Medical Problem List and Plan: 1.Functional and mobility deficitssecondary to lumbar stenosis with neurogenic claudication, s/p re-do laminectomy with decompression of bilateral L3,L4,L5 nerve roots on 04/24/20. Tent DC 11/10  Continue CIR 2. Antithrombotics: -DVT/anticoagulation:Mechanical:Sequential compression devices, below kneeBilateral lower extremities -antiplatelet therapy: N/A 3. Pain Management:  Cont Gabapentin 300mg  daily qhs  Continue Oxycontin mg  15 BID, DC'd on 11/2  Cont tramadol 50 TID  Cont Percocet 5 q4 PRN  Lidocaine patch ordered  D/cedVicodin per report of confusion  Lidoderm patch ordered for back  Behavioral component as well  Controlled when not being asked or distracted on 11/4  Add heating pad for lower back  Pain is stable 4. Mood:LCSW to follow for evaluation and support. -antipsychotic agents: N/A 5. Neuropsych: This patientis not fullycapable of making decisions on hisown behalf. 6. Skin/Wound Care:Monitor wound for healing, 7. Fluids/Electrolytes/Nutrition:Monitor I/Os. BMET stable despite poor recorded fluid intake , meal intake ~75%  good  8. T2DM with hyperglycemia: On glucotrol 10 mg am/5 mg pm--monitor for hypoglycemic episodes with rise in SCr. Will Monitor BS ac/hs and use SSI for elevated BS.  11/7: elevated to 176 last night. Continue to monitor  Monitor with increased exertion 9.Acute on chronic anemia:   Continue iron supplement bid  Monitor for signs of bleeding.   Hb 8.4 on 11/1, improving to 9.3 on 11/8  Cont to monitor 10. CKD stage III: Baseline SCr-1.43-->1.54  Cr.  1.34 on 11/3, 1.32 on 11/5 11. Flaccid neurogenic bladder. Patient with chronic Foley tubesince 2017 back surgery.Changed chronic Foley on 11/4--gets foley change monthly at [facility] urology. 12.Chronic BLE edema/Hypertension.Monitor BP -continue Lasix 40 mg daily.   BP controlled 11/5  Monitor with increased mobility 13. GERD: managed by protonix, changed timing per patient preference 14. Hyperlipidemia.Continue Lipitor 15. Chronic constipation:  KUB with constipation  MiraLAX DC'd on 11/2  Movantik ordered on 11/1, increased on 11/4  Had BM 11/5 and 11/7 16. Hyponatremia, mild asymptomatic likely related to lasix   Na 134 on 11/3 17. Hypoalbuminemia  Supplement initiated on 10/27 18.  Hypokalemia  Resolved   Supplemented x1 day 19. Disposition: Patient is very concerned about discharge home given that  her husband is sick and will not be able to help her much. Her granddaughter works full time and her daughter works half day but both will be able to support her. SW to f/u with daughter today   LOS: 13 days A FACE TO FACE EVALUATION WAS PERFORMED  Charlett Blake 05/15/2020, 10:25 AM

## 2020-05-15 NOTE — Progress Notes (Signed)
Occupational Therapy Session Note  Patient Details  Name: [name] MRN: [id number] Date of Birth: 1929/09/11  Today's Date: 05/15/2020 OT Individual Time: 1103-1200 OT Individual Time Calculation (min): 57 min    Short Term Goals: Week 2:  OT Short Term Goal 1 (Week 2): STG = LTGs due to remaining ELOS  Skilled Therapeutic Interventions/Progress Updates:    Treatment session with focus on self-care retraining, functional transfers, use of AE for LB bathing/dressing, and d/c planning.  Pt received upright in recliner still reporting concerns with d/c home due to husband hurting ankle.  Pt expressing desire to shower this session.  Pt ambulated to room shower with RW with supervision.  Undressed at sit > stand level from tub bench with use of reacher to doff pants while adhering to back precautions.  Pt able to complete bathing at sit > stand level this session.  Pt reports having loofa on long handle to allow her to wash lower legs and feet.  Pt reports feeling pressure in stomach and requesting to toilet.  Completed short ambulatory transfer to [facility] over toilet with RW with supervision. Pt incontinent of bowel during ambulation to toilet, with no sensation. Pt engaged in dressing from [facility] due to continued reports of pain/discomfort and wanting to continue to evacuate bowels.  Pt utilized reacher to don pants with setup.  Pt donned bra this session to attempt to improve independence with donning back brace with less interference from breasts.  Pt remained seated on toilet due to continued pressure/discomfort in stomach.  RN and nurse tech notified of pt position, pt to pull cord on call bell system when finished on toilet.    Therapy Documentation Precautions:  Precautions Precautions: Fall, Back Required Braces or Orthoses: Spinal Brace Spinal Brace: Lumbar corset, Applied in sitting position Restrictions Weight Bearing Restrictions: No Pain:  Pt reports pain in back 7/10,  premedicated.   Therapy/Group: Individual Therapy  Simonne Come 05/15/2020, 12:16 PM

## 2020-05-15 NOTE — Progress Notes (Signed)
Patient ID: [name], female   DOB: 04-09-1930, 86 y.o.   MRN: [id number]  Met with pt to discuss discharge concerns. She is anxious especially now her husband's has hurt his ankle and he is not getting around well. He has had a x-ray now and is waiting for the MD to read it. Discussed hiring assist if needed and they do have long term care insurance. Pt also did talk with her daughter this weekend and she assured her they wold work it out. Pt needs to get home and settled and a routine set for her to become more comfortable. Pt does realize she will have pain where ever she is at home or in a SNF. Pt also does not want to go to a SNF either and this is here option. Continue to follow and provide support to pt. Have given her a private duty list to hire assist if needed.

## 2020-05-15 NOTE — Progress Notes (Signed)
Physical Therapy Session Note  Patient Details  Name: [name] MRN: [id number] Date of Birth: April 08, 1930  Today's Date: 05/15/2020 PT Individual Time: 0922-1005 PT Individual Time Calculation (min): 43 min   Short Term Goals: Week 2:  PT Short Term Goal 1 (Week 2): STG=LTG due to LOS  Skilled Therapeutic Interventions/Progress Updates:     Patient in recliner with LSO donned in the room upon PT arrival. Patient alert and agreeable to PT session. Patient reported 5-6/10 low back and L hip pain during session, RN made aware. PT provided repositioning, rest breaks, and distraction as pain interventions throughout session.   Discussed d/c planning and home management when home with her husband. Focused session on gait training for increased activity tolerance/endurance and self-care and home management for managing meals, household mobility in the ADL apartment. Discussed d/c planning and educated on energy conservation strategies and fall prevention at home throughout session.   Therapeutic Activity: Transfers: Patient performed sit to/from stand from the hospital recliner and ADL recliner x2 with CGA-close supervision. Provided verbal cues for maintaining a straight back and flexing at her hips to maintain back precautions. Placed w/c cushion and a pillow in the ADL recliner for increased elevation to stand. Educated on use of a cushion or pillow in the seat and pillow behind placed in the chair at all times to improve safety with home mobility. Patient appreciative of education. Patient performed functional ambulation around the ADL apartment transitioning from carpet to tile without changes in gait or LOB. She stood to simulate retrieving a frozen meal from the freezer and placing it in the microwave. Discussed options for having a dining chair placed near by to sit and rest as food was cooking. Patient stated that she has an elevating stool, PT recommended a sturdy dining chair for  increased safety.    Therapeutic Exercise: Patient performed the following exercises with verbal and tactile cues for proper technique. Ambulated >200 ft x2 with CGA-close supervision using RW for increased endurance and activity tolerance. Provided cues for erect posture, increased step height, and decreased shoulder elevation throughout. RPE 5-6/10 after each trial.  Patient in recliner in the room at end of session with breaks locked, chair alarm set, and all needs within reach. Patient requesting her daughter come in to know how to set up the house and assist when she is there, lead PT made aware.    Therapy Documentation Precautions:  Precautions Precautions: Fall, Back Required Braces or Orthoses: Spinal Brace Spinal Brace: Lumbar corset, Applied in sitting position Restrictions Weight Bearing Restrictions: No    Therapy/Group: Individual Therapy  Donni Oglesby L Gedeon Brandow PT, DPT  05/15/2020, 3:53 PM

## 2020-05-15 NOTE — Progress Notes (Signed)
Physical Therapy Session Note  Patient Details  Name: [name] MRN: [id number] Date of Birth: [birth year]  Today's Date: 05/15/2020 PT Individual Time: [id number] and 1497-0263 PT Individual Time Calculation (min): 24 min and 54 min  Short Term Goals: Week 1:  PT Short Term Goal 1 (Week 1): pt will perform bed mobility with min A consistantly PT Short Term Goal 1 - Progress (Week 1): Met PT Short Term Goal 2 (Week 1): pt will transfer bed<>WC with LRAD min A PT Short Term Goal 2 - Progress (Week 1): Met PT Short Term Goal 3 (Week 1): Pt will ambulate 74f with LRAD CGA PT Short Term Goal 3 - Progress (Week 1): Met Week 2:  PT Short Term Goal 1 (Week 2): STG=LTG due to LOS  Skilled Therapeutic Interventions/Progress Updates:   Treatment Session 1: [id number] min Received pt supine in bed, pt agreeable to therapy, and reported pain 8/10 in low back (premedicated). Repositioning, rest breaks, and distraction done to reduce pain levels. Session with emphasis on functional mobility/transfers, generalized strengthening, dynamic standing balance/coordination, ambulation, and improved activity tolerance. Donned bilateral ted hose total A. Laboratory present to draw blood during session. Pt transferred supine<>sitting EOB with HOB elevated and use of bedrails with supervision. Pt reported mild dizziness and requested to sit for a few minutes; symptoms resolves in <2 minutes. Donned LSO total A and slip on shoes with set up assist while sitting EOB. Discussed discharge date as pt with concerns regarding not being able to get up and move around despite therapist explaining pt is at a CGA/supervision level overall. Pt transferred sit<>stand with RW and supervision and ambulated 2064fwith RW and supervision with cues for upright posture and hip extension with gait. Concluded session with pt sitting in recliner, needs within reach, and chair pad alarm on.   Treatment Session 2: [id number] 67min Received pt sitting in recliner, pt agreeable to therapy, and reported pain 9/10 in low back (premedicated). Repositioning, rest breaks, and distraction done to reduce pain levels. Session with emphasis on functional mobility/transfers, generalized strengthening, dynamic standing balance/coordination, ambulation, simulated car transfers, stair navigation, and improved activity tolerance. Pt transferred sit<>stand with RW and supervision and ambulated >15033fith RW and supervision to ortho gym and performed ambulatory simulated car transfer with RW and supervision with cues for turning technique to enter car. Pt ambulated 37f26f uneven surfaces (ramp) with RW and close supervision with cues to keep RW close. Pt performed TUG with RW and close supervision with the following results: Trial 1: 35 seconds Trial 2: 33 seconds Trial 3: 35 seconds Pt educated on tests results and significance indicating high fall risk. Educated pt on importance of use of RW at home for safety pt verbalized understanding. Pt transferred sit<>stand with RW and supervision and performed alternating toe taps to 6in step 2x20 bilaterally with BUE support and supervision. Pt transferred mat<>WC stand<>pivot with RW and supervision and [facility] therapy [facility]. Pt navigated 12 steps with 2 rails CGA ascending and descending with a step to pattern with verbal cues for "up with the good, down with the bad" technique. Pt ambulated additional 150ft56fh RW and supervision back to room. Concluded session with pt sitting in recliner, needs within reach, and chair pad alarm on.   Therapy Documentation Precautions:  Precautions Precautions: Fall, Back Required Braces or Orthoses: Spinal Brace Spinal Brace: Lumbar corset, Applied in sitting position Restrictions Weight Bearing Restrictions: No  Therapy/Group: Individual  Therapy McDonald PT, DPT   05/15/2020, 7:18 AM

## 2020-05-16 ENCOUNTER — Inpatient Hospital Stay (HOSPITAL_COMMUNITY): Payer: Medicare Other

## 2020-05-16 ENCOUNTER — Inpatient Hospital Stay (HOSPITAL_COMMUNITY): Payer: Medicare Other | Admitting: Physical Therapy

## 2020-05-16 ENCOUNTER — Inpatient Hospital Stay (HOSPITAL_COMMUNITY): Payer: Medicare Other | Admitting: Occupational Therapy

## 2020-05-16 LAB — GLUCOSE, CAPILLARY
Glucose-Capillary: 146 mg/dL — ABNORMAL HIGH (ref 70–99)
Glucose-Capillary: 161 mg/dL — ABNORMAL HIGH (ref 70–99)
Glucose-Capillary: 146 mg/dL — ABNORMAL HIGH (ref 70–99)
Glucose-Capillary: 136 mg/dL — ABNORMAL HIGH (ref 70–99)

## 2020-05-16 NOTE — Progress Notes (Signed)
[place] PHYSICAL MEDICINE & REHABILITATION PROGRESS NOTE  Subjective/Complaints: Patient seen sitting up in bed this morning working with therapies.  She states she slept well overnight.  When asked, she states she is in pain and has questions regarding pain medications upon discharge.  Majority of time spent discussing patient's husband's foot edema and pain.  Patient with several questions and concerns regarding etiology, treatment, prognosis, etc.  Discussed that patient's foot would need to be evaluated.  ROS: +Generalized pain.  Denies CP, SOB, N/V/D  Objective: Vital Signs: Blood pressure 117/60, pulse 96, temperature 97.7 F (36.5 C), resp. rate 18, height 5\' 2"  (1.575 m), weight 64.5 kg, SpO2 96 %. No results found. Recent Labs    05/15/20 0812  WBC 4.6  HGB 9.3*  HCT 30.3*  PLT 399   Recent Labs    05/15/20 0812  NA 133*  K 4.1  CL 94*  CO2 30  GLUCOSE 220*  BUN 25*  CREATININE 1.35*  CALCIUM 9.3    Intake/Output Summary (Last 24 hours) at 05/16/2020 0946 Last data filed at 05/16/2020 0826 Gross per 24 hour  Intake 670 ml  Output 2075 ml  Net -1405 ml    Physical Exam: BP 117/60 (BP Location: Left Arm)   Pulse 96   Temp 97.7 F (36.5 C)   Resp 18   Ht 5\' 2"  (1.575 m)   Wt 64.5 kg   SpO2 96%   BMI 26.01 kg/m  Constitutional: No distress . Vital signs reviewed. HENT: Normocephalic.  Atraumatic. Eyes: EOMI. No discharge. Cardiovascular: No JVD.  RRR. Respiratory: Normal effort.  No stridor.  Bilateral clear to auscultation. GI: Non-distended.  BS +. GU: + Foley Skin: Warm and dry.  Intact. Psych: Normal mood.  Normal behavior. Musc: No edema in extremities.  No tenderness in extremities. Neuro:  Alert and oriented. HOH Motor: B/l UE 5/5 Right lower extremity: 3/5 hip flexion, knee extension, 5/5 ankle dorsiflexion Left lower extremity: Hip flexion, knee extension 4/5, ankle dorsiflexion 5/5  Assessment/Plan: 1. Functional deficits secondary  to lumbar stenosis with neurogenic claudication s/p redo laminectomy with decompression b/l L3-5 which require 3+ hours per day of interdisciplinary therapy in a comprehensive inpatient rehab setting.  Physiatrist is providing close team supervision and 24 hour management of active medical problems listed below.  Physiatrist and rehab team continue to assess barriers to discharge/monitor patient progress toward functional and medical goals   Care Tool:  Bathing    Body parts bathed by patient: Right arm, Left arm, Chest, Abdomen, Face, Front perineal area, Buttocks, Right upper leg, Left upper leg, Right lower leg, Left lower leg   Body parts bathed by helper: Right lower leg, Left lower leg     Bathing assist Assist Level: Supervision/Verbal cueing (with long handled sponge)     Upper Body Dressing/Undressing Upper body dressing   What is the patient wearing?: Pull over shirt, Orthosis    Upper body assist Assist Level: Supervision/Verbal cueing    Lower Body Dressing/Undressing Lower body dressing      What is the patient wearing?: Pants     Lower body assist Assist for lower body dressing: Supervision/Verbal cueing     Toileting Toileting    Toileting assist Assist for toileting: Minimal Assistance - Patient > 75%     Transfers Chair/bed transfer  Transfers assist     Chair/bed transfer assist level: Supervision/Verbal cueing Chair/bed transfer assistive device: Programmer, multimedia   Ambulation assist  Assist level: Supervision/Verbal cueing Assistive device: Walker-rolling Max distance: >135ft   Walk 10 feet activity   Assist     Assist level: Supervision/Verbal cueing Assistive device: Walker-rolling   Walk 50 feet activity   Assist Walk 50 feet with 2 turns activity did not occur: Safety/medical concerns (fatigue, pain, generalized weakness)  Assist level: Supervision/Verbal cueing Assistive device: Walker-rolling     Walk 150 feet activity   Assist Walk 150 feet activity did not occur: Safety/medical concerns (fatigue, pain, generalized weakness)  Assist level: Supervision/Verbal cueing Assistive device: Walker-rolling    Walk 10 feet on uneven surface  activity   Assist Walk 10 feet on uneven surfaces activity did not occur: Safety/medical concerns (fatigue, pain, generalized weakness)   Assist level: Supervision/Verbal cueing Assistive device: Aeronautical engineer Will patient use wheelchair at discharge?: No Type of Wheelchair: Manual    Wheelchair assist level: Supervision/Verbal cueing Max wheelchair distance: >16ft    Wheelchair 50 feet with 2 turns activity    Assist        Assist Level: Supervision/Verbal cueing   Wheelchair 150 feet activity     Assist      Assist Level: Supervision/Verbal cueing    Medical Problem List and Plan: 1.Functional and mobility deficitssecondary to lumbar stenosis with neurogenic claudication, s/p re-do laminectomy with decompression of bilateral L3,L4,L5 nerve roots on 04/24/20.  Continue CIR 2. Antithrombotics: -DVT/anticoagulation:Mechanical:Sequential compression devices, below kneeBilateral lower extremities -antiplatelet therapy: N/A 3. Pain Management:  Cont Gabapentin 300mg  daily qhs  Continue Oxycontin mg  15 BID, DC'd on 11/2  Cont tramadol 50 TID  Cont Percocet 5 q4 PRN  Lidocaine patch ordered  D/cedVicodin per report of confusion  Lidoderm patch ordered for back  Behavioral component as well  Controlled when not being asked or distracted on 11/4  Add heating pad for lower back  Controlled with meds on 11/9 4. Mood:LCSW to follow for evaluation and support. -antipsychotic agents: N/A 5. Neuropsych: This patientis not fullycapable of making decisions on hisown behalf. 6. Skin/Wound Care:Monitor wound for healing, 7.  Fluids/Electrolytes/Nutrition:Monitor I/Os.  8. T2DM with hyperglycemia: On glucotrol 10 mg am/5 mg pm  Will Monitor BS ac/hs and use SSI for elevated BS.  Elevated on 11/9, but variable based on p.o. intake, we will not make any changes today to avoid hypoglycemia  Monitor with increased exertion 9.Acute on chronic anemia:   Continue iron supplement bid  Monitor for signs of bleeding.   Hemoglobin 9.3 on 11/8  Cont to monitor 10. CKD stage III: Baseline SCr-1.43-->1.54  Creatinine 1.35 on 11/8 11. Flaccid neurogenic bladder. Patient with chronic Foley tubesince 2017 back surgery.Changed chronic Foley on 11/4--gets f[facility] change monthly at [facility] urology. 12.Chronic BLE edema/Hypertension.Monitor BP -continue Lasix 40 mg daily.   BP controlled 11/5  Monitor with increased mobility 13. GERD: managed by protonix, changed timing per patient preference 14. Hyperlipidemia.Continue Lipitor 15. Chronic constipation:  KUB with constipation  MiraLAX DC'd on 11/2  Movantik ordered on 11/1, increased on 11/4  Improving 16. Hyponatremia, mild asymptomatic likely related to Lasix  Sodium 133 on 11/8 17. Hypoalbuminemia  Supplement initiated on 10/27 18.  Hypokalemia  Resolved   Supplemented x1 day   LOS: 14 days A FACE TO FACE EVALUATION WAS PERFORMED  Maddisyn Hegwood Lorie Phenix 05/16/2020, 9:46 AM

## 2020-05-16 NOTE — Progress Notes (Signed)
Physical Therapy Discharge Summary  Patient Details  Name: [name] MRN: [id number] Date of Birth: [birth year]  Patient has met 10 of 10 long term goals due to improved activity tolerance, improved balance, improved postural control, increased strength, decreased pain and improved coordination. Patient to discharge at an ambulatory level Supervision. Pt's family did not attend family education training, however pt's daughter and granddaughter will be present to assist pt and pt's husband at home.   All goals met   Recommendation:  Patient will benefit from ongoing skilled PT services in home health setting to continue to advance safe functional mobility, address ongoing impairments in transfers, generalized strengthening, dynamic standing balance/coordination, ambulation, endurance, and to minimize fall risk.  Equipment: No equipment provided  Reasons for discharge: treatment goals met  Patient/family agrees with progress made and goals achieved: Yes  PT Discharge Precautions/Restrictions Precautions Precautions: Fall;Back Required Braces or Orthoses: Spinal Brace Spinal Brace: Lumbar corset Restrictions Weight Bearing Restrictions: No Cognition Overall Cognitive Status: Within Functional Limits for tasks assessed Arousal/Alertness: Awake/alert Orientation Level: Oriented X4 Memory: Impaired Awareness: Appears intact Problem Solving: Appears intact Safety/Judgment: Appears intact Sensation Sensation Light Touch: Appears Intact Proprioception: Appears Intact Coordination Gross Motor Movements are Fluid and Coordinated: No Fine Motor Movements are Fluid and Coordinated: Yes Coordination and Movement Description: mild uncoordination due to generalized weakness, decreased endurance, and pain Finger Nose Finger Test: WFL bilaterally Heel Shin Test: WFL bilaterally Motor  Motor Motor: Abnormal postural alignment and control Motor - Skilled Clinical Observations: mild  uncoordination due to generalized weakness and decreased endurance  Mobility Bed Mobility Bed Mobility: Rolling Right;Rolling Left;Supine to Sit;Sit to Supine Rolling Right: Supervision/verbal cueing Rolling Left: Supervision/Verbal cueing Supine to Sit: Supervision/Verbal cueing Sit to Supine: Supervision/Verbal cueing Transfers Transfers: Sit to Stand;Stand to Sit;Stand Pivot Transfers Sit to Stand: Supervision/Verbal cueing Stand to Sit: Supervision/Verbal cueing Stand Pivot Transfers: Supervision/Verbal cueing Stand Pivot Transfer Details: Other (comment) Stand Pivot Transfer Details (indicate cue type and reason): cues to manage catheter Transfer (Assistive device): Rolling walker Locomotion  Gait Ambulation: Yes Gait Assistance: Supervision/Verbal cueing Gait Distance (Feet): 150 Feet Assistive device: Rolling walker Gait Assistance Details: Verbal cues for technique Gait Assistance Details: verbal cues for upright posture Gait Gait: Yes Gait Pattern: Impaired Gait Pattern: Step-to pattern;Decreased step length - right;Decreased step length - left;Decreased stride length;Trendelenburg;Narrow base of support;Antalgic;Poor foot clearance - left;Poor foot clearance - right;Trunk flexed;Decreased trunk rotation Gait velocity: Decreased Stairs / Additional Locomotion Stairs: Yes Stairs Assistance: Contact Guard/Touching assist Stair Management Technique: Two rails Number of Stairs: 12 Height of Stairs: 6 Ramp: Supervision/Verbal cueing (RW) Product manager Mobility: Yes Wheelchair Assistance: Chartered loss adjuster: Both upper extremities Wheelchair Parts Management: Needs assistance Distance: >145f  Trunk/Postural Assessment  Cervical Assessment Cervical Assessment: Exceptions to [facility](forward head) Thoracic Assessment Thoracic Assessment: Exceptions to [facility] Lumbar Assessment Lumbar Assessment: Exceptions to [facility] (posterior pelvic tilt) Postural Control Postural Control: Deficits on evaluation  Balance Balance Balance Assessed: Yes Static Sitting Balance Static Sitting - Balance Support: Feet supported;No upper extremity supported Static Sitting - Level of Assistance: 7: Independent Dynamic Sitting Balance Dynamic Sitting - Balance Support: Feet supported;No upper extremity supported Dynamic Sitting - Level of Assistance: 7: Independent Static Standing Balance Static Standing - Balance Support: Bilateral upper extremity supported (RW) Static Standing - Level of Assistance: 5: Stand by assistance (supervision) Dynamic Standing Balance Dynamic Standing - Balance Support: Bilateral upper extremity supported (RW) Dynamic Standing - Level of Assistance: 5: Stand by  assistance (supervision) Extremity Assessment  RLE Assessment RLE Assessment: Exceptions to [facility] General Strength Comments: grossly generalized to 4/5 LLE Assessment LLE Assessment: Exceptions to [facility] General Strength Comments: grossly generalized to 4/5   Alfonse Alpers PT, DPT  05/16/2020, 7:43 AM

## 2020-05-16 NOTE — Progress Notes (Addendum)
Inpatient Rehabilitation Care Coordinator  Discharge Note  The overall goal for the admission was met for:   Discharge location: [place] TO ASSIST-MAY HIRE ASSIST  Length of Stay: Yes-15 days  Discharge activity level: Yes-SUPERVISION WITH CUEING  Home/community participation: Yes  Services provided included: MD, RD, PT, OT, RN, CM, Pharmacy, Neuropsych and SW  Financial Services: Medicare and Private Insurance: [place]  Follow-up services arranged: Home Health: [facility]-PT, OT, RN and Patient/Family request agency HH: [facility]IVE PT WITH [facility], DME: NO NEEDS  Comments (or additional information):  Patient/Family verbalized understanding of follow-up arrangements: Yes  Individual responsible for coordination of the follow-up plan: [name] [name]-DAUGHTER [phone]  Confirmed correct DME delivered: Elease Hashimoto 05/16/2020    Elease Hashimoto

## 2020-05-16 NOTE — Progress Notes (Signed)
Patient ID: [name], female   DOB: 01/13/1930, 86 y.o.   MRN: [id number] Spoke with daughter yesterday to discuss hiring assist, she will call long term care insurance and begin the process. Has private duty list and will choose one of them and contact. Pt continues to be nervous about going home but plans to go tomorrow.

## 2020-05-16 NOTE — Progress Notes (Signed)
Physical Therapy Session Note  Patient Details  Name: [name] MRN: [id number] Date of Birth: June 11, 1930  Today's Date: 05/16/2020 PT Individual Time: 1445-1530 PT Individual Time Calculation (min): 45 min   Short Term Goals: Week 1:  PT Short Term Goal 1 (Week 1): pt will perform bed mobility with min A consistantly PT Short Term Goal 1 - Progress (Week 1): Met PT Short Term Goal 2 (Week 1): pt will transfer bed<>WC with LRAD min A PT Short Term Goal 2 - Progress (Week 1): Met PT Short Term Goal 3 (Week 1): Pt will ambulate 31f with LRAD CGA PT Short Term Goal 3 - Progress (Week 1): Met Week 2:  PT Short Term Goal 1 (Week 2): STG=LTG due to LOS  Skilled Therapeutic Interventions/Progress Updates:    pt received in recliner and agreeable to therapy. Daughter present. Pt directed in Sit to stand from recliner to Rolling walker at SBA and directed in to gait training for 300' SBA with VC for trunk extension, improved head carriage. Pt then directed in dynamic standing balance training on foam surface with one UE support [facility] at [facility] and placing items in targeted area with crossing midline and weight shifting completed with reaching inside and outside BOS, 2x25 items completed with no LOB or excessive sway noted. Pt requested seated rest break, then directed in 3x5 Sit to stand from EOM at SBA. Pt directed in gait training to return to room, 150' SBA with Rolling walker. Pt transferred to recliner, left sitting in this position, alarm set,  All needs in reach and in good condition. Call light in hand.    Therapy Documentation Precautions:  Precautions Precautions: Fall, Back Required Braces or Orthoses: Spinal Brace Spinal Brace: Lumbar corset Restrictions Weight Bearing Restrictions: No General:   Vital Signs: Therapy Vitals Temp: 98.2 F (36.8 C) Pulse Rate: 93 Resp: 20 BP: (!) 111/55 Patient Position (if appropriate): Sitting Oxygen  Therapy SpO2: 92 % O2 Device: Room Air    Therapy/Group: Individual Therapy  HJunie Panning11/03/2020, 3:52 PM

## 2020-05-16 NOTE — Progress Notes (Signed)
Occupational Therapy Discharge Summary  Patient Details  Name: [name] MRN: [id number] Date of Birth: March 05, 1930   Patient has met 7 of 8 long term goals due to improved activity tolerance, improved balance, postural control and ability to compensate for deficits.  Patient to discharge at overall Supervision level.  Patient's care partner is independent to provide the necessary intermittent assistance at discharge.  Patient's daughter has been present early on in stay, however due to increased demand with pt's husband having any injury daughter has been unavailable for family education.  Pt is able to complete all transfers and bathing and dressing with supervision with use of AE to maintain back precautions with LB bathing and dressing.  Pt does continue to require intermittent assistance with toileting due to bowel incontinence and therefore requiring increased assistance for hygiene due to incontinence and decreased reach due to back precautions.  Reasons goals not met: Pt continues to have episodes of incontinent bowel, requiring increased assistance to complete hygiene due to incontinence and difficulty with reach due to back precautions.  Recommendation:  Patient will benefit from ongoing skilled OT services in home health setting to continue to advance functional skills in the area of BADL and Reduce care partner burden.  Equipment: No equipment provided  Reasons for discharge: treatment goals met and discharge from hospital  Patient/family agrees with progress made and goals achieved: Yes  OT Discharge Precautions/Restrictions  Precautions Precautions: Fall;Back Required Braces or Orthoses: Spinal Brace Spinal Brace: Lumbar corset Restrictions Weight Bearing Restrictions: No General   Vital Signs Therapy Vitals Temp: 97.7 F (36.5 C) Pulse Rate: 96 Resp: 18 BP: 117/60 Patient Position (if appropriate): Lying Oxygen Therapy SpO2: 96 % O2 Device: Room  Air Pain Pain Assessment Pain Scale: 0-10 Pain Score: 8  Pain Type: Chronic pain Pain Location: Back Pain Intervention(s): Medication (See eMAR) ADL ADL Equipment Provided: Reacher, Long-handled sponge Eating: Independent Where Assessed-Eating: Chair Grooming: Supervision/safety Where Assessed-Grooming: Standing at sink Upper Body Bathing: Supervision/safety Where Assessed-Upper Body Bathing: Shower Lower Body Bathing: Supervision/safety Where Assessed-Lower Body Bathing: Shower Upper Body Dressing: Supervision/safety Where Assessed-Upper Body Dressing: Chair Lower Body Dressing: Supervision/safety Where Assessed-Lower Body Dressing: Chair Toileting: Minimal assistance Where Assessed-Toileting: Bedside Commode Toilet Transfer: Close supervision Toilet Transfer Method: Counselling psychologist: Geophysical data processor: Close supervision Social research officer, government Method: Heritage manager: Radio broadcast assistant, Grab bars Vision Baseline Vision/History: Wears glasses Wears Glasses: At all times Patient Visual Report: No change from baseline Vision Assessment?: No apparent visual deficits Perception  Perception: Within Functional Limits Praxis Praxis: Intact Cognition Overall Cognitive Status: Within Functional Limits for tasks assessed Arousal/Alertness: Awake/alert Orientation Level: Oriented X4 Memory: Impaired Awareness: Appears intact Problem Solving: Appears intact Safety/Judgment: Appears intact Sensation Sensation Light Touch: Appears Intact Proprioception: Appears Intact Coordination Gross Motor Movements are Fluid and Coordinated: No Fine Motor Movements are Fluid and Coordinated: Yes Coordination and Movement Description: mild uncoordination due to generalized weakness, decreased endurance, and pain Finger Nose Finger Test: [facility] bilaterally Heel Shin Test: WFL bilaterally Motor  Motor Motor: Abnormal postural  alignment and control Motor - Skilled Clinical Observations: mild uncoordination due to generalized weakness and decreased endurance Mobility  Bed Mobility Bed Mobility: Rolling Right;Rolling Left;Supine to Sit;Sit to Supine Rolling Right: Supervision/verbal cueing Rolling Left: Supervision/Verbal cueing Supine to Sit: Supervision/Verbal cueing Sit to Supine: Supervision/Verbal cueing Transfers Sit to Stand: Supervision/Verbal cueing Stand to Sit: Supervision/Verbal cueing  Trunk/Postural Assessment  Cervical Assessment Cervical Assessment: Exceptions to [facility] (forward  head) Thoracic Assessment Thoracic Assessment: Exceptions to [facility] (kyphosis) Lumbar Assessment Lumbar Assessment: Exceptions to [facility] (posterior pelvic tilt) Postural Control Postural Control: Deficits on evaluation  Balance Balance Balance Assessed: Yes Static Sitting Balance Static Sitting - Balance Support: Feet supported;No upper extremity supported Static Sitting - Level of Assistance: 7: Independent Dynamic Sitting Balance Dynamic Sitting - Balance Support: Feet supported;No upper extremity supported Dynamic Sitting - Level of Assistance: 7: Independent Static Standing Balance Static Standing - Balance Support: Bilateral upper extremity supported (RW) Static Standing - Level of Assistance: 5: Stand by assistance (supervision) Dynamic Standing Balance Dynamic Standing - Balance Support: Bilateral upper extremity supported (RW) Dynamic Standing - Level of Assistance: 5: Stand by assistance (supervision) Extremity/Trunk Assessment RUE Assessment RUE Assessment: Within Functional Limits LUE Assessment LUE Assessment: Within Functional Limits   Briel Gallicchio, [facility] 05/16/2020, 8:13 AM

## 2020-05-16 NOTE — Progress Notes (Signed)
Occupational Therapy Session Note  Patient Details  Name: DECLYN OFFIELD MRN: [id number] Date of Birth: 02-Dec-1929  Today's Date: 05/16/2020 OT Individual Time: 1016-1101 OT Individual Time Calculation (min): 45 min    Short Term Goals: Week 2:  OT Short Term Goal 1 (Week 2): STG = LTGs due to remaining ELOS  Skilled Therapeutic Interventions/Progress Updates:    Treatment session with focus on functional mobility and dynamic standing balance.  Pt declined bathing/dressing this session, as had completed shower yesterday.  Pt ambulated to Day Room with RW with supervision.  Engaged in dynamic standing balance while completing table top task to challenge endurance and adherence to back precautions during reaching.  Therapist educated on various techniques to increased safety and adherence to back precautions during home making tasks.  Therapist issued pt walker bag and educated on use to maintain appropriate hand placement on RW during mobility.  Pt ambulated back to room and remained seated in recliner with all needs in reach.  Therapy Documentation Precautions:  Precautions Precautions: Fall, Back Required Braces or Orthoses: Spinal Brace Spinal Brace: Lumbar corset Restrictions Weight Bearing Restrictions: No General:   Vital Signs: Therapy Vitals Temp: 97.7 F (36.5 C) Pulse Rate: 96 Resp: 18 BP: 117/60 Patient Position (if appropriate): Lying Oxygen Therapy SpO2: 96 % O2 Device: Room Air Pain:  Pt with no c/o pain   Therapy/Group: Individual Therapy  Simonne Come 05/16/2020, 8:13 AM

## 2020-05-16 NOTE — Progress Notes (Signed)
Physical Therapy Session Note  Patient Details  Name: [name] MRN: [id number] Date of Birth: [birth year]  Today's Date: 05/16/2020 PT Individual Time: 7858-8502 and 7741-2878 PT Individual Time Calculation (min): 54 min and 42 min  Short Term Goals: Week 1:  PT Short Term Goal 1 (Week 1): pt will perform bed mobility with min A consistantly PT Short Term Goal 1 - Progress (Week 1): Met PT Short Term Goal 2 (Week 1): pt will transfer bed<>WC with LRAD min A PT Short Term Goal 2 - Progress (Week 1): Met PT Short Term Goal 3 (Week 1): Pt will ambulate 70f with LRAD CGA PT Short Term Goal 3 - Progress (Week 1): Met Week 2:  PT Short Term Goal 1 (Week 2): STG=LTG due to LOS  Skilled Therapeutic Interventions/Progress Updates:   Treatment Session 1: [id number] min Received pt supine in bed, pt agreeable to therapy, and reported pain 7/10 in low back (premedicated). Repositioning, rest breaks, and distraction done to reduce pain levels. Session with emphasis on discharge planning, functional mobility/transfers, generalized strengthening, dynamic standing balance/coordinaiton, ambulation, and improved activity tolerance. Donned bilateral ted hose and shoes supine in bed total A and MD present for morning rounds and pt with questions regarding her husband's injuries. Pt transferred supine<>sitting EOB from flat bed with use of bedrails and supervision and donned lumbar corset sitting EOB with mod A. Pt transferred bed<>WC stand<>pivot with RW and supervision. Discussed amount of assist at home and pt reported her daughter and granddaughter will both be coming every day to check on her and her husband. Educated pt on importance of avoiding trying to physically assist her husband due to safety concerns and risk of further injuring herself; pt in agreement. Pt performed WC mobility >1547fusing BUE and BLE with supervision and increased time. Pt transported remainder of way to rehab apartment and  transferred WC<>bed stand<>pivot with RW and supervision and sit<>supine with supervision and increased time. Pt reported dizziness; resolved in <2 minutes. Pt rolled L and R and transferred supine<>sitting EOB with supervision. Dizziness returned; resolved in <2 minutes. Discussed catheter management and educated pt on places to hang catheter bag so that is is always lower than the bladder; pt verbalized understanding. Pt ambulated >15037fith RW and supervision back to room with min cues for upright posture and hip extension. Concluded session with pt sitting in recliner, needs within reach, and chair pad alarm on.   Treatment Session 2: [id number] min Received pt sitting in recliner, pt agreeable to therapy, and reported feeling "queasy" after eating lunch and reported pain 7/10 in low back (premedicated). Repositioning, rest breaks, and distraction done to reduce pain levels. Session with emphasis on functional mobility/transfers, generalized strengthening, dynamic standing balance/coordination, ambulation, and improved activity tolerance. Pt ambulated [id number] trials with RW and supervision to/from dayroom. Pt transferred sit<>stand without AD and close supervision and worked on dynamic standing balance playing connect four with 1 UE support on table and close supervision x 3 trials. Worked on dynamic standing balance tapping each LE to 3 colored cones based off therapist's cues 2x8 reps with mod handheld assist. Pt transferred sit<>stand without AD and 1 UE with CGA x 5 reps with emphasis on strengthening. Concluded session with pt sitting in recliner, needs within reach, and chair pad alarm on. Pt emotional at end of session regarding current mobility status, quality of life, and pain levels. Therapist educated pt on recovery timeline and healing process and provided emotional support  and encouragement; pt appreciative.   Therapy Documentation Precautions:  Precautions Precautions: Fall,  Back Required Braces or Orthoses: Spinal Brace Spinal Brace: Lumbar corset, Applied in sitting position Restrictions Weight Bearing Restrictions: No   Therapy/Group: Individual Therapy Alfonse Alpers PT, DPT   05/16/2020, 7:17 AM

## 2020-05-17 LAB — GLUCOSE, CAPILLARY
Glucose-Capillary: 126 mg/dL — ABNORMAL HIGH (ref 70–99)
Glucose-Capillary: 65 mg/dL — ABNORMAL LOW (ref 70–99)
Glucose-Capillary: 118 mg/dL — ABNORMAL HIGH (ref 70–99)

## 2020-05-17 MED ORDER — CYCLOBENZAPRINE HCL 10 MG PO TABS: 5.0000 mg | ORAL_TABLET | Freq: Three times a day (TID) | ORAL | 0 refills | Status: DC | PRN

## 2020-05-17 MED ORDER — GABAPENTIN 400 MG PO CAPS: 400.0000 mg | ORAL_CAPSULE | Freq: Every day | ORAL | 0 refills | Status: DC

## 2020-05-17 MED ORDER — TRAMADOL HCL 50 MG PO TABS: 50.0000 mg | ORAL_TABLET | Freq: Four times a day (QID) | ORAL | 0 refills | Status: DC | PRN

## 2020-05-17 MED ORDER — NALOXEGOL OXALATE 25 MG PO TABS: 25.0000 mg | ORAL_TABLET | Freq: Every day | ORAL | 0 refills | Status: DC

## 2020-05-17 MED ORDER — DOCUSATE SODIUM 100 MG PO CAPS: 100.0000 mg | ORAL_CAPSULE | Freq: Two times a day (BID) | ORAL | 0 refills | Status: DC

## 2020-05-17 MED ORDER — MELATONIN 3 MG PO TABS: 3.0000 mg | ORAL_TABLET | Freq: Every evening | ORAL | 0 refills | Status: DC | PRN

## 2020-05-17 MED ORDER — LIDOCAINE 5 % EX PTCH: 1.0000 | MEDICATED_PATCH | CUTANEOUS | 0 refills | Status: DC

## 2020-05-17 MED ORDER — DOCUSATE SODIUM 100 MG PO CAPS
100.0000 mg | ORAL_CAPSULE | Freq: Two times a day (BID) | ORAL | Status: DC
  Administered 2020-05-17: 100 mg via ORAL
  Filled 2020-05-17: qty 1

## 2020-05-17 MED ORDER — TRAMADOL HCL 50 MG PO TABS: 50.0000 mg | ORAL_TABLET | Freq: Two times a day (BID) | ORAL | 0 refills | Status: DC

## 2020-05-17 MED ORDER — OXYCODONE-ACETAMINOPHEN 5-325 MG PO TABS: 1.0000 | ORAL_TABLET | Freq: Four times a day (QID) | ORAL | 0 refills | Status: DC | PRN

## 2020-05-17 MED ORDER — FUROSEMIDE 40 MG PO TABS: 40.0000 mg | ORAL_TABLET | Freq: Every day | ORAL | 0 refills | Status: DC

## 2020-05-17 NOTE — Progress Notes (Addendum)
[facility] PHYSICAL MEDICINE & REHABILITATION PROGRESS NOTE  Subjective/Complaints: Patient seen sitting up in bed this morning.  She states she slept well overnight.  She states her left heel hurts.  Discussed PRAFO, however patient states she has not been compliant, states she will be.  She recalls details of our conversation yesterday.  She states she has not had a bowel movement in a couple of days.  ROS: Denies CP, SOB, N/V/D  Objective: Vital Signs: Blood pressure (!) 109/52, pulse 92, temperature 98.5 F (36.9 C), temperature source Oral, resp. rate 16, height 5\' 2"  (1.575 m), weight 64.5 kg, SpO2 95 %. No results found. Recent Labs    05/15/20 0812  WBC 4.6  HGB 9.3*  HCT 30.3*  PLT 399   Recent Labs    05/15/20 0812  NA 133*  K 4.1  CL 94*  CO2 30  GLUCOSE 220*  BUN 25*  CREATININE 1.35*  CALCIUM 9.3    Intake/Output Summary (Last 24 hours) at 05/17/2020 1058 Last data filed at 05/17/2020 0606 Gross per 24 hour  Intake 340 ml  Output 1650 ml  Net -1310 ml    Physical Exam: BP (!) 109/52 (BP Location: Right Arm)   Pulse 92   Temp 98.5 F (36.9 C) (Oral)   Resp 16   Ht 5\' 2"  (1.575 m)   Wt 64.5 kg   SpO2 95%   BMI 26.01 kg/m  Constitutional: No distress . Vital signs reviewed. HENT: Normocephalic.  Atraumatic. Eyes: EOMI. No discharge. Cardiovascular: No JVD.  RRR. Respiratory: Normal effort.  No stridor.  Bilateral clear to auscultation. GI: Non-distended.  BS +. GU: + Foley. Skin: Warm and dry.  Intact. Psych: Normal mood.  Normal behavior. Musc: No edema in extremities.  Mild tenderness in left heel, no skin breakdown. Neuro: Alert and oriented. HOH Motor: B/l UE 5/5 Right lower extremity: 3/5 hip flexion, knee extension, 5/5 ankle dorsiflexion, unchanged Left lower extremity: Hip flexion, knee extension 4/5, ankle dorsiflexion 5/5  Assessment/Plan: 1. Functional deficits secondary to lumbar stenosis with neurogenic claudication s/p redo  laminectomy with decompression b/l L3-5 which require 3+ hours per day of interdisciplinary therapy in a comprehensive inpatient rehab setting.  Physiatrist is providing close team supervision and 24 hour management of active medical problems listed below.  Physiatrist and rehab team continue to assess barriers to discharge/monitor patient progress toward functional and medical goals   Care Tool:  Bathing    Body parts bathed by patient: Right arm, Left arm, Chest, Abdomen, Face, Front perineal area, Buttocks, Right upper leg, Left upper leg, Right lower leg, Left lower leg   Body parts bathed by helper: Right lower leg, Left lower leg     Bathing assist Assist Level: Supervision/Verbal cueing (with long handled sponge)     Upper Body Dressing/Undressing Upper body dressing   What is the patient wearing?: Pull over shirt, Orthosis    Upper body assist Assist Level: Supervision/Verbal cueing    Lower Body Dressing/Undressing Lower body dressing      What is the patient wearing?: Pants     Lower body assist Assist for lower body dressing: Supervision/Verbal cueing     Toileting Toileting    Toileting assist Assist for toileting: Minimal Assistance - Patient > 75%     Transfers Chair/bed transfer  Transfers assist     Chair/bed transfer assist level: Supervision/Verbal cueing Chair/bed transfer assistive device: Programmer, multimedia   Ambulation assist  Assist level: Supervision/Verbal cueing Assistive device: Walker-rolling Max distance: >158ft   Walk 10 feet activity   Assist     Assist level: Supervision/Verbal cueing Assistive device: Walker-rolling   Walk 50 feet activity   Assist Walk 50 feet with 2 turns activity did not occur: Safety/medical concerns (fatigue, pain, generalized weakness)  Assist level: Supervision/Verbal cueing Assistive device: Walker-rolling    Walk 150 feet activity   Assist Walk 150 feet  activity did not occur: Safety/medical concerns (fatigue, pain, generalized weakness)  Assist level: Supervision/Verbal cueing Assistive device: Walker-rolling    Walk 10 feet on uneven surface  activity   Assist Walk 10 feet on uneven surfaces activity did not occur: Safety/medical concerns (fatigue, pain, generalized weakness)   Assist level: Supervision/Verbal cueing Assistive device: Aeronautical engineer Will patient use wheelchair at discharge?: No Type of Wheelchair: Manual    Wheelchair assist level: Supervision/Verbal cueing Max wheelchair distance: >152ft    Wheelchair 50 feet with 2 turns activity    Assist        Assist Level: Supervision/Verbal cueing   Wheelchair 150 feet activity     Assist      Assist Level: Supervision/Verbal cueing    Medical Problem List and Plan: 1.Functional and mobility deficitssecondary to lumbar stenosis with neurogenic claudication, s/p re-do laminectomy with decompression of bilateral L3,L4,L5 nerve roots on 04/24/20.  DC today  Will see patient for hospital follow up in 1 month post-discharge 2. Antithrombotics: -DVT/anticoagulation:Mechanical:Sequential compression devices, below kneeBilateral lower extremities -antiplatelet therapy: N/A 3. Pain Management:  Cont Gabapentin 300mg  daily qhs  Continue Oxycontin mg  15 BID, DC'd on 11/2  Cont tramadol 50 TID  Cont Percocet 5 q4 PRN  Lidocaine patch ordered  D/cedVicodin per report of confusion  Lidoderm patch ordered for back  Behavioral component as well  Controlled when not being asked or distracted on 11/4  Add heating pad for lower back  Controlled with meds on 11/10 4. Mood:LCSW to follow for evaluation and support. -antipsychotic agents: N/A 5. Neuropsych: This patientis not fullycapable of making decisions on hisown behalf. 6. Skin/Wound Care:Monitor wound for healing, 7.  Fluids/Electrolytes/Nutrition:Monitor I/Os.  8. T2DM with hyperglycemia: On glucotrol 10 mg am/5 mg pm  Will Monitor BS ac/hs and use SSI for elevated BS.  Mildly elevated on 11/10, variable p.o. intake-avoid hypoglycemia  Monitor with increased exertion 9.Acute on chronic anemia:   Continue iron supplement bid  Monitor for signs of bleeding.   Hemoglobin 9.3 on 11/8  Cont to monitor 10. CKD stage III: Baseline SCr-1.43-->1.54  Creatinine 1.35 on 11/8 11. Flaccid neurogenic bladder. Patient with chronic Foley tubesince 2017 back surgery.Changed chronic Foley on 11/4--gets foley change monthly at [facility] urology. 12.Chronic BLE edema/Hypertension.Monitor BP -continue Lasix 40 mg daily.   BP controlled 11/5  Monitor with increased mobility 13. GERD: managed by protonix, changed timing per patient preference 14. Hyperlipidemia.Continue Lipitor 15. Chronic constipation:  KUB with constipation  MiraLAX DC'd on 11/2  Movantik ordered on 11/1, increased on 11/4  Colace ordered on 11/10 as patient has not used restroom in a couple of days 16. Hyponatremia, mild asymptomatic likely related to Lasix  Sodium 133 on 11/8 17. Hypoalbuminemia  Supplement initiated on 10/27 18.  Hypokalemia  Resolved   Supplemented x1 day   > 30 minutes spent in total in discharge planning between myself and PA regarding aforementioned, as well discussion regarding DME equipment, follow-up appointments, follow-up therapies, discharge medications, discharge recommendations  LOS: 15 days A FACE TO FACE EVALUATION WAS PERFORMED  Aneshia Jacquet Lorie Phenix 05/17/2020, 10:58 AM

## 2020-05-17 NOTE — Progress Notes (Signed)
Patient has been d/c home  with all belongings and  with daughter, discharge instructions given by PA, education given, no complications noted at this time.   Dayna Ramus, LPN

## 2020-05-17 NOTE — Significant Event (Signed)
Hypoglycemic Event  CBG: 65  Treatment: 4 oz juice/soda  Symptoms: None  Follow-up CBG: Time:1201 CBG Result:118  Possible Reasons for Event: Unknown  Comments/MD notified:patient was asymptomatic hypoglycemia resolved.     Dayna Ramus

## 2020-05-17 NOTE — Discharge Instructions (Signed)
Inpatient Rehab Discharge Instructions  [name] Discharge date and time: 05/17/20   Activities/Precautions/ Functional Status: Activity: no lifting, driving, or strenuous exercise for till cleared by MD Diet: diabetic diet Wound Care: keep wound clean and dry Contact MD if you develop any problems with your incision/wound--redness, swelling, increase in pain, drainage or if you develop fever or chills.   Functional status:  ___ No restrictions     ___ Walk up steps independently _X__ 24/7 supervision/assistance   ___ Walk up steps with assistance ___ Intermittent supervision/assistance  ___ Bathe/dress independently ___ Walk with walker     ___ Bathe/dress with assistance ___ Walk Independently    ___ Shower independently _X__ Walk with stand by assist.    _X__ Shower with assistance _X__ No alcohol     ___ Return to work/school ________    Special Instructions: 1. No bending, twisting or arching.  2. NO driving or lifting items over 5 lbs. 3. Check blood sugars before meals and at bedtime--follow up with PCP for input on adjustment of medications.    COMMUNITY REFERRALS UPON DISCHARGE:    Home Health:   PT, OT, RN                [place] Phone:[phone]    Medical Equipment/Items Ordered:HAS ALL NEEDED EQUIPMENT-FROM PAST ADMISSIONS OTHER: GAVE HER A PRIVATE DUTY LIST IN CASE WANTS TO HIRE ASSIST AT HOME FOR SHE AND HUSBAND                                                    My questions have been answered and I understand these instructions. I will adhere to these goals and the provided educational materials after my discharge from the hospital.  Patient/Caregiver Signature _______________________________ Date __________  Clinician Signature _______________________________________ Date __________  Please bring this form and your medication list with you to all your follow-up doctor's appointments.

## 2020-05-17 NOTE — Discharge Summary (Addendum)
Physician Discharge Summary  Patient ID: [name] MRN: [id number] DOB/AGE: 10-08-1929 86 y.o.  Admit date: 05/02/2020 Discharge date: 05/17/2020  Discharge Diagnoses:  Principal Problem:   Lumbar radiculopathy Active Problems:   Constipation   Hypoalbuminemia due to protein-calorie malnutrition (HCC)   Benign essential HTN   Stage 3b chronic kidney disease (HCC)   Chronic pain syndrome   Controlled type 2 diabetes mellitus with hyperglycemia, without long-term current use of insulin (HCC)   Drug induced constipation   Neurogenic bladder   Discharged Condition: Stable   Significant Diagnostic Studies: DG Abd 1 View  Result Date: 05/06/2020 CLINICAL DATA:  Obstipation. EXAM: ABDOMEN - 1 VIEW COMPARISON:  05/02/2020 FINDINGS: Supine views of the abdomen were obtained. Again noted is surgical hardware lower lumbar spine. Gas-filled structure in the central abdomen probably related to the stomach but could possibly related to the transverse colon. Overall, nonobstructive bowel gas pattern. Elevation of the right hemidiaphragm. Evidence for rectal gas. IMPRESSION: Nonobstructive bowel gas pattern. Gas-filled structure in the upper central abdomen may be related to the stomach. Electronically Signed   By: Markus Daft M.D.   On: 05/06/2020 10:26    VAS [place] LOWER EXTREMITY VENOUS (DVT)  Result Date: 05/03/2020  Lower Venous DVTStudy Indications: Edema, and Swelling.  Risk Factors: Immobility. Performing Technologist: Griffin Basil RCT RDMS  Examination Guidelines: A complete evaluation includes B-mode imaging, spectral Doppler, color Doppler, and power Doppler as needed of all accessible portions of each vessel. Bilateral testing is considered an integral part of a complete examination. Limited examinations for reoccurring indications may be performed as noted. The reflux portion of the exam is performed with the patient in reverse Trendelenburg.   +---------+---------------+---------+-----------+----------+--------------+ RIGHT    CompressibilityPhasicitySpontaneityPropertiesThrombus Aging +---------+---------------+---------+-----------+----------+--------------+ CFV      Full           Yes      Yes                                 +---------+---------------+---------+-----------+----------+--------------+ SFJ      Full                                                        +---------+---------------+---------+-----------+----------+--------------+ FV Prox  Full                                                        +---------+---------------+---------+-----------+----------+--------------+ FV Mid   Full                                                        +---------+---------------+---------+-----------+----------+--------------+ FV DistalFull                                                        +---------+---------------+---------+-----------+----------+--------------+ PFV  Full                                                        +---------+---------------+---------+-----------+----------+--------------+ POP      Full           Yes      Yes                                 +---------+---------------+---------+-----------+----------+--------------+ PTV      Full                                                        +---------+---------------+---------+-----------+----------+--------------+ PERO     Full                                                        +---------+---------------+---------+-----------+----------+--------------+   +---------+---------------+---------+-----------+----------+--------------+ LEFT     CompressibilityPhasicitySpontaneityPropertiesThrombus Aging +---------+---------------+---------+-----------+----------+--------------+ CFV      Full           Yes      Yes                                  +---------+---------------+---------+-----------+----------+--------------+ SFJ      Full                                                        +---------+---------------+---------+-----------+----------+--------------+ FV Prox  Full                                                        +---------+---------------+---------+-----------+----------+--------------+ FV Mid   Full                                                        +---------+---------------+---------+-----------+----------+--------------+ FV DistalFull                                                        +---------+---------------+---------+-----------+----------+--------------+ PFV      Full                                                        +---------+---------------+---------+-----------+----------+--------------+  POP      Full           Yes      Yes                                 +---------+---------------+---------+-----------+----------+--------------+ PTV      Full                                                        +---------+---------------+---------+-----------+----------+--------------+ PERO     Full                                                        +---------+---------------+---------+-----------+----------+--------------+     Summary: RIGHT: - There is no evidence of deep vein thrombosis in the lower extremity.  - No cystic structure found in the popliteal fossa.  LEFT: - There is no evidence of deep vein thrombosis in the lower extremity.  - No cystic structure found in the popliteal fossa.  *See table(s) above for measurements and observations. Electronically signed by Harold Barban MD on 05/03/2020 at 8:55:38 PM.    Final     Labs:  Basic Metabolic Panel: BMP Latest Ref Rng & Units 05/15/2020 05/12/2020 05/10/2020  Glucose 70 - 99 mg/dL 220(H) 142(H) 144(H)  BUN 8 - 23 mg/dL 25(H) 22 16  Creatinine 0.44 - 1.00 mg/dL 1.35(H) 1.32(H) 1.34(H)  Sodium 135 - 145  mmol/L 133(L) 135 134(L)  Potassium 3.5 - 5.1 mmol/L 4.1 3.8 3.4(L)  Chloride 98 - 111 mmol/L 94(L) 95(L) 93(L)  CO2 22 - 32 mmol/L 30 31 32  Calcium 8.9 - 10.3 mg/dL 9.3 9.4 8.9    CBC: CBC Latest Ref Rng & Units 05/15/2020 05/08/2020 05/03/2020  WBC 4.0 - 10.5 K/uL 4.6 5.6 6.2  Hemoglobin 12.0 - 15.0 g/dL 9.3(L) 8.4(L) 7.4(L)  Hematocrit 36 - 46 % 30.3(L) 27.4(L) 23.5(L)  Platelets 150 - 400 K/uL 399 599(H) 453(H)    CBG: Recent Labs  Lab 05/16/20 1632 05/16/20 2222 05/17/20 0602 05/17/20 1126 05/17/20 1201  GLUCAP 146* 136* 126* 65* 118*    Brief HPI:   [name] is a 86 y.o. female with history of T2DM, neurogenic bladder, CKD, DDD with back and lower extremity pain with neurogenic claudication due to L2-L5 stenosis with spondylolisthesis.  She was admitted on 04/24/2020 for redo L2/3, L3/4 and L4/5 laminotomy, laminectomy with decompression of L3-L5 nerve roots by Dr. Arnoldo Morale.  Postop course significant for generalized weakness with hypotension, somnolence, acute on chronic renal failure as well as acute blood loss anemia.  As pain control has been poor narcotics was titrated upwards with better control.  She continued to have limitations due to back pain radiating to LLE, poor standing balance as well as difficulty with back precautions.  CIR was recommended due to functional deficits.   Hospital Course: ALLEAH DEARMAN was admitted to rehab 05/02/2020 for inpatient therapies to consist of PT, ST and OT at least three hours five days a week. Past admission physiatrist, therapy team and rehab RN have worked together to provide customized collaborative inpatient rehab.  Bilateral lower extremity Dopplers done were negative for DVT.  She was found to have significant constipation at admission and was started on aggressive bowel program.  Movantik was ordered on 11/1 and Colace added additionally with improvement in symptoms.  Her blood pressures were monitored tid and has been  stable on lasix.   Transient hypokalemia has resolved with brief supplementation.  Follow-up check of lytes shows serum creatinine is at baseline.   Acute on chronic anemia has been monitored with serial checks and is noted to be slowly improving.  Reactive thrombocytosis has resolved.  Foley remains in place due to neurogenic bladder and this was was changed out on 11/04.  Her diabetes has been monitored with ac/hs CBG checks and SSI was use prn for tighter BS control.  P.o. intake has been variable with hypoglycemic episodes therefore p.m. dose Glucotrol was discontinued.  Patient was advised to continue monitoring blood sugars on a 4 times daily basis and follow-up with PCP for further adjustment.  Her back incision is intact and healing well without signs or symptoms of infection.  Pain control has gradually improved with titration of gabapentin, scheduling of tramadol as well as use of lidocaine patch.  Oxycodone has been used on as needed basis and patient/daughter have been educated on tapering narcotics after discharge.  She has made good gains during her stay and is currently at supervision level.  She will continue to receive follow-up home health PT, OT and RN by advanced home care after discharge   Rehab course: During patient's stay in rehab weekly team conferences were held to monitor patient's progress, set goals and discuss barriers to discharge. At admission, patient required mod assist with mobility and mod to max assist with basic ADL task. She  has had improvement in activity tolerance, balance, postural control as well as ability to compensate for deficits.  She is able to complete ADL tasks with supervision and use of adaptive equipment. She requires standby assist for transfers and to ambulate 300 feet with rolling walker.  Family education has been completed with daughter.   Discharge disposition: 01-Home or Self Care  Diet: Heart healthy/diabetic  Special Instructions: 1.  No  driving, bending, twisting, arching or lifting items over 5 pounds. 2.  Check blood sugars before meals and at bedtime.  Follow-up with PCP for further adjustment in hypoglycemic regimen. 3.   Discharge Instructions     Ambulatory referral to Physical Medicine Rehab   Complete by: As directed    3-4 week f/u appt      Allergies as of 05/17/2020       Reactions   Phenergan [promethazine]    Slurred speech, acted very confused        Medication List     STOP taking these medications    bisacodyl 10 MG suppository Commonly known as: DULCOLAX   ondansetron 4 MG disintegrating tablet Commonly known as: ZOFRAN-ODT   oxyCODONE 20 mg 12 hr tablet Commonly known as: OxyCONTIN   Potassium 99 MG Tabs       TAKE these medications    acetaminophen 500 MG tablet Commonly known as: TYLENOL Take 500 mg by mouth every 6 (six) hours as needed for mild pain or moderate pain.   aspirin EC 81 MG tablet Take 81 mg by mouth daily at 12 noon.   atorvastatin 10 MG tablet Commonly known as: LIPITOR Take 1 tablet (10 mg total) by mouth daily.   cyanocobalamin 1000 MCG tablet Take 1,000 mcg  by mouth at bedtime.   cyclobenzaprine 10 MG tablet Commonly known as: FLEXERIL Take 0.5 tablets (5 mg total) by mouth 3 (three) times daily as needed for muscle spasms. What changed: how much to take   docusate sodium 100 MG capsule Commonly known as: COLACE Take 1 capsule (100 mg total) by mouth 2 (two) times daily. Notes to patient: OTC   esomeprazole 40 MG capsule Commonly known as: NEXIUM TAKE 1 CAPSULE(40 MG) BY MOUTH DAILY What changed:  how much to take how to take this when to take this additional instructions   ferrous sulfate 325 (65 FE) MG tablet Take 1 tablet (325 mg total) by mouth 2 (two) times daily with a meal. Notes to patient: Purchase over the counter   furosemide 40 MG tablet Commonly known as: LASIX Take 1 tablet (40 mg total) by mouth daily. Or as  directed   gabapentin 400 MG capsule Commonly known as: NEURONTIN Take 1 capsule (400 mg total) by mouth at bedtime. What changed:  medication strength how much to take   glipiZIDE 10 MG tablet Commonly known as: GLUCOTROL Take 1 tablet (10 mg total) by mouth in the morning. What changed: Another medication with the same name was removed. Continue taking this medication, and follow the directions you see here.   lidocaine 5 % Commonly known as: LIDODERM Place 1 patch onto the skin daily. Place on the right hip at 8 am and remove at 8 pm daily--has to be off for 12 hours. Start taking on: May 18, 2020   melatonin 3 MG Tabs tablet Take 1 tablet (3 mg total) by mouth at bedtime as needed (insomnia).   naloxegol oxalate 25 MG Tabs tablet Commonly known as: MOVANTIK Take 1 tablet (25 mg total) by mouth daily. Start taking on: May 18, 2020   oxyCODONE-acetaminophen 5-325 MG tablet--28 pills Commonly known as: PERCOCET/ROXICET Take 1 tablet by mouth every 6 (six) hours as needed for severe pain. What changed:  when to take this reasons to take this Notes to patient: Limit to 2 pills AS NEEDED per day   polyethylene glycol 17 g packet Commonly known as: MIRALAX / GLYCOLAX Take 17 g by mouth daily as needed for moderate constipation or severe constipation.   PRESERVISION AREDS PO Take 1 capsule by mouth in the morning and at bedtime.   traMADol 50 MG tablet--Rx # 28 pills Commonly known as: ULTRAM Take 1 tablet (50 mg total) by mouth every 6 (six) hours as needed. Notes to patient: Taper to twice a day after a couple of days and wean off as tolerated.    Vitamin D 50 MCG (2000 UT) tablet Take 2,000 Units by mouth daily.        Follow-up Information     Jamse Arn, MD Follow up.   Specialty: Physical Medicine and Rehabilitation Why: Office will call you with appointment Contact information: [address] [place]  [address] [phone]         Burnis Medin, MD. Call.   Specialties: Internal Medicine, Pediatrics Why: for post hospital follow up Contact information: [place] [address] [phone]         Newman Pies, MD. Call in 1 day(s).   Specialty: Neurosurgery Why: for post op appointment Contact information: [address] [place] [address] [place] [address] [phone]                 Signed: Bary Leriche  05/17/2020, 7:29 PM .Patient was seen, face-face, and physical exam performed by me on day of discharge, greater than 30 minutes of total time spent.. Please see progress note from day of discharge as well.  Delice Lesch, MD, ABPMR

## 2020-05-17 NOTE — Progress Notes (Signed)
PDMP was reviewed for the past year prior to prescribing Percocet 5/325 # 28 pills- one tab po qid prn severe pain a and tramadol 50 mg/ # 28 pills  --one tab qid prn moderate pain.

## 2020-05-18 ENCOUNTER — Other Ambulatory Visit: Payer: Self-pay

## 2020-05-18 ENCOUNTER — Other Ambulatory Visit: Payer: Self-pay | Admitting: *Deleted

## 2020-05-18 ENCOUNTER — Encounter: Payer: Self-pay | Admitting: *Deleted

## 2020-05-18 NOTE — Patient Outreach (Signed)
[facility]) Care Management  05/18/2020  [name] 25-May-1930 [id number]    Outreach attempt #1 to daughter, successful.  Identity verified.  This care manager introduced self and stated purpose of call.  [facility] care management services explained.    Social: Daughter report member lives with her husband, however she has been helping daily to manage member's care.  Member was admitted to hospital 10/18-1026 for laminectomy, discharged to inpatient rehab.  She was discharged from inpatient rehab on 11/10.  Primary MD office will complete transition of care assessment.    Per daughter, member has home health through [facility] for PT, OT, and nursing.  She will also have in home aide assistance through [facility], unsure what days or how many hours they will come, waiting on call to schedule first visit.  State member is needing assistance with some of her ADL's, has all DME needed.  Conditions: Per chart, also has history of HTN, GERD, Hypothyroidism, DM, DJD, Renal insufficiency, and dyslipidemia.  Blood sugars are monitored daily, range in the 100's.  She was 230 today after food.    Medications: Reviewed with daughter, denies concern for cost or management.    Appointments: Daughter will provide transportation to all appointments.  PCP visit scheduled for 11/24, visit with rehab physician on 12/6.  Daughter denies any urgent concerns, agrees to contact this care manger with questions.   Plan: RN CM will send EMMI education regarding back surgery recovery.  Will follow up within the next 2 weeks.  Goals Addressed            This Visit's Progress   . [facility] - Keep Pain Under Control       Follow Up Date 12/15   - bring pain diary to all appointments - call for medicine refill 2 or 3 days before it runs out - develop a personal pain management plan - keep track of prescription refills - plan exercise or activity when pain is best controlled - stay  active - track times pain is worst and when it is best - track what makes the pain worse and what makes it better    Why is this important?   Day-to-day life can be hard when you have back pain.  Pain medicine is just one piece of the treatment puzzle. There are many things you can do to manage pain and keep your back strong.   Lifestyle changes, like stopping smoking and eating foods with Vitamin D and calcium, keep your bones and muscles healthy. Your back is better when it is supported by strong muscles.  You can try these action steps to help you manage your pain.     Notes:     . [facility] - Stay Active and Independent       Follow Up Date 07/22/2020   - choose a type of activity I enjoy - get out for a short walk before breakfast, after dinner or both - plan family outings that include physical activity, like hiking, backpacking, swimming - walk indoors    Why is this important?   Regular activity or exercise is important to managing back pain.  Activity helps to keep your muscles strong.  You will sleep better and feel more relaxed.  You will have more energy and feel less stressed.  If you are not active now, start slowly. Little changes make a big difference.  Rest, but not too much.  Stay as active as you can and  listen to your body's signals.     Notes:       Valente David, RN, MSN [place] [phone]

## 2020-05-19 ENCOUNTER — Telehealth: Payer: Self-pay | Admitting: Internal Medicine

## 2020-05-19 DIAGNOSIS — H9190 Unspecified hearing loss, unspecified ear: Secondary | ICD-10-CM | POA: Diagnosis not present

## 2020-05-19 DIAGNOSIS — D62 Acute posthemorrhagic anemia: Secondary | ICD-10-CM | POA: Diagnosis not present

## 2020-05-19 DIAGNOSIS — Z7984 Long term (current) use of oral hypoglycemic drugs: Secondary | ICD-10-CM | POA: Diagnosis not present

## 2020-05-19 DIAGNOSIS — N189 Chronic kidney disease, unspecified: Secondary | ICD-10-CM | POA: Diagnosis not present

## 2020-05-19 DIAGNOSIS — E1122 Type 2 diabetes mellitus with diabetic chronic kidney disease: Secondary | ICD-10-CM | POA: Diagnosis not present

## 2020-05-19 DIAGNOSIS — Z79891 Long term (current) use of opiate analgesic: Secondary | ICD-10-CM | POA: Diagnosis not present

## 2020-05-19 DIAGNOSIS — Z4789 Encounter for other orthopedic aftercare: Secondary | ICD-10-CM | POA: Diagnosis not present

## 2020-05-19 DIAGNOSIS — M4316 Spondylolisthesis, lumbar region: Secondary | ICD-10-CM | POA: Diagnosis not present

## 2020-05-19 DIAGNOSIS — Z7982 Long term (current) use of aspirin: Secondary | ICD-10-CM | POA: Diagnosis not present

## 2020-05-19 DIAGNOSIS — Z466 Encounter for fitting and adjustment of urinary device: Secondary | ICD-10-CM | POA: Diagnosis not present

## 2020-05-19 NOTE — Telephone Encounter (Signed)
Donita is call to get verbal order for nursing orders for 1 week 9.  May leave a message on her secured voice mail.

## 2020-05-20 DIAGNOSIS — Z79891 Long term (current) use of opiate analgesic: Secondary | ICD-10-CM | POA: Diagnosis not present

## 2020-05-20 DIAGNOSIS — N189 Chronic kidney disease, unspecified: Secondary | ICD-10-CM | POA: Diagnosis not present

## 2020-05-20 DIAGNOSIS — H9190 Unspecified hearing loss, unspecified ear: Secondary | ICD-10-CM | POA: Diagnosis not present

## 2020-05-20 DIAGNOSIS — M4316 Spondylolisthesis, lumbar region: Secondary | ICD-10-CM | POA: Diagnosis not present

## 2020-05-20 DIAGNOSIS — E1122 Type 2 diabetes mellitus with diabetic chronic kidney disease: Secondary | ICD-10-CM | POA: Diagnosis not present

## 2020-05-20 DIAGNOSIS — Z4789 Encounter for other orthopedic aftercare: Secondary | ICD-10-CM | POA: Diagnosis not present

## 2020-05-22 ENCOUNTER — Telehealth: Payer: Self-pay | Admitting: *Deleted

## 2020-05-22 DIAGNOSIS — Z79891 Long term (current) use of opiate analgesic: Secondary | ICD-10-CM | POA: Diagnosis not present

## 2020-05-22 DIAGNOSIS — N189 Chronic kidney disease, unspecified: Secondary | ICD-10-CM | POA: Diagnosis not present

## 2020-05-22 DIAGNOSIS — M4316 Spondylolisthesis, lumbar region: Secondary | ICD-10-CM | POA: Diagnosis not present

## 2020-05-22 DIAGNOSIS — H9190 Unspecified hearing loss, unspecified ear: Secondary | ICD-10-CM | POA: Diagnosis not present

## 2020-05-22 DIAGNOSIS — Z4789 Encounter for other orthopedic aftercare: Secondary | ICD-10-CM | POA: Diagnosis not present

## 2020-05-22 DIAGNOSIS — E1122 Type 2 diabetes mellitus with diabetic chronic kidney disease: Secondary | ICD-10-CM | POA: Diagnosis not present

## 2020-05-22 NOTE — Telephone Encounter (Signed)
She has been under surgical care   .  And I haven't seen her  Yet      I can help with medical needs but not pain management  Surgical or  pain related

## 2020-05-22 NOTE — Telephone Encounter (Signed)
Donita [facility] called for POC 1 wk [facility] called for OT 1 wk 8.  I have approved both.

## 2020-05-23 ENCOUNTER — Telehealth: Payer: Self-pay | Admitting: *Deleted

## 2020-05-23 DIAGNOSIS — M4316 Spondylolisthesis, lumbar region: Secondary | ICD-10-CM | POA: Diagnosis not present

## 2020-05-23 DIAGNOSIS — N189 Chronic kidney disease, unspecified: Secondary | ICD-10-CM | POA: Diagnosis not present

## 2020-05-23 DIAGNOSIS — Z4789 Encounter for other orthopedic aftercare: Secondary | ICD-10-CM | POA: Diagnosis not present

## 2020-05-23 DIAGNOSIS — E1122 Type 2 diabetes mellitus with diabetic chronic kidney disease: Secondary | ICD-10-CM | POA: Diagnosis not present

## 2020-05-23 DIAGNOSIS — Z79891 Long term (current) use of opiate analgesic: Secondary | ICD-10-CM | POA: Diagnosis not present

## 2020-05-23 DIAGNOSIS — H9190 Unspecified hearing loss, unspecified ear: Secondary | ICD-10-CM | POA: Diagnosis not present

## 2020-05-23 NOTE — Telephone Encounter (Signed)
[facility] PT [facility] called for POC of 1wk1, 2wk1,1wk1,2wk2, 1wk4.  Approval given.

## 2020-05-24 ENCOUNTER — Telehealth: Payer: Self-pay | Admitting: Internal Medicine

## 2020-05-24 DIAGNOSIS — Z79891 Long term (current) use of opiate analgesic: Secondary | ICD-10-CM | POA: Diagnosis not present

## 2020-05-24 DIAGNOSIS — H9190 Unspecified hearing loss, unspecified ear: Secondary | ICD-10-CM | POA: Diagnosis not present

## 2020-05-24 DIAGNOSIS — Z4789 Encounter for other orthopedic aftercare: Secondary | ICD-10-CM | POA: Diagnosis not present

## 2020-05-24 DIAGNOSIS — M4316 Spondylolisthesis, lumbar region: Secondary | ICD-10-CM | POA: Diagnosis not present

## 2020-05-24 DIAGNOSIS — E1122 Type 2 diabetes mellitus with diabetic chronic kidney disease: Secondary | ICD-10-CM | POA: Diagnosis not present

## 2020-05-24 DIAGNOSIS — N189 Chronic kidney disease, unspecified: Secondary | ICD-10-CM | POA: Diagnosis not present

## 2020-05-24 NOTE — Telephone Encounter (Signed)
Called left message for nurse Donita

## 2020-05-24 NOTE — Telephone Encounter (Signed)
Patient is calling and stated that she is having trouble sleeping at night and wanted to see if Dr. Regis Bill can send a prescription to [facility] [place] [place] [place] [facility] [address]  Phone:  [phone] Fax:  [phone]  CB is [phone]

## 2020-05-25 NOTE — Telephone Encounter (Signed)
Please advise 

## 2020-05-26 DIAGNOSIS — M4316 Spondylolisthesis, lumbar region: Secondary | ICD-10-CM | POA: Diagnosis not present

## 2020-05-26 DIAGNOSIS — H9190 Unspecified hearing loss, unspecified ear: Secondary | ICD-10-CM | POA: Diagnosis not present

## 2020-05-26 DIAGNOSIS — E1122 Type 2 diabetes mellitus with diabetic chronic kidney disease: Secondary | ICD-10-CM | POA: Diagnosis not present

## 2020-05-26 DIAGNOSIS — Z4789 Encounter for other orthopedic aftercare: Secondary | ICD-10-CM | POA: Diagnosis not present

## 2020-05-26 DIAGNOSIS — N189 Chronic kidney disease, unspecified: Secondary | ICD-10-CM | POA: Diagnosis not present

## 2020-05-26 DIAGNOSIS — Z79891 Long term (current) use of opiate analgesic: Secondary | ICD-10-CM | POA: Diagnosis not present

## 2020-05-26 NOTE — Telephone Encounter (Signed)
I dont have  Other advice until I see her   Except trying  otc melatonin 2-3 hours before sleep.  sometimes  Sleep is off from meds and transition from hosp and rehab to home  Let us know if  You were given a sleep aid when  In rehab.

## 2020-05-26 NOTE — Telephone Encounter (Signed)
Pts daughter Rodena Piety) is calling is stating that she has been waiting to see what sleep aid that she could give to her mother and what dosage.  Per Dr. Regis Bill she can take OTC melatonin 2-3 hrs before bed until she sees her at her appointment on Wednesday 05/31/2020.  Pts daughter is aware that she should be taking what was prescribed to pt 3 MG until she comes to the office.

## 2020-05-29 DIAGNOSIS — M48062 Spinal stenosis, lumbar region with neurogenic claudication: Secondary | ICD-10-CM | POA: Diagnosis not present

## 2020-05-29 DIAGNOSIS — Z4789 Encounter for other orthopedic aftercare: Secondary | ICD-10-CM | POA: Diagnosis not present

## 2020-05-29 DIAGNOSIS — M4316 Spondylolisthesis, lumbar region: Secondary | ICD-10-CM | POA: Diagnosis not present

## 2020-05-29 DIAGNOSIS — Z79891 Long term (current) use of opiate analgesic: Secondary | ICD-10-CM | POA: Diagnosis not present

## 2020-05-29 DIAGNOSIS — E1122 Type 2 diabetes mellitus with diabetic chronic kidney disease: Secondary | ICD-10-CM | POA: Diagnosis not present

## 2020-05-29 DIAGNOSIS — N189 Chronic kidney disease, unspecified: Secondary | ICD-10-CM | POA: Diagnosis not present

## 2020-05-29 DIAGNOSIS — H9190 Unspecified hearing loss, unspecified ear: Secondary | ICD-10-CM | POA: Diagnosis not present

## 2020-05-30 DIAGNOSIS — Z79891 Long term (current) use of opiate analgesic: Secondary | ICD-10-CM | POA: Diagnosis not present

## 2020-05-30 DIAGNOSIS — M4316 Spondylolisthesis, lumbar region: Secondary | ICD-10-CM | POA: Diagnosis not present

## 2020-05-30 DIAGNOSIS — N189 Chronic kidney disease, unspecified: Secondary | ICD-10-CM | POA: Diagnosis not present

## 2020-05-30 DIAGNOSIS — Z4789 Encounter for other orthopedic aftercare: Secondary | ICD-10-CM | POA: Diagnosis not present

## 2020-05-30 DIAGNOSIS — E1122 Type 2 diabetes mellitus with diabetic chronic kidney disease: Secondary | ICD-10-CM | POA: Diagnosis not present

## 2020-05-30 DIAGNOSIS — H9190 Unspecified hearing loss, unspecified ear: Secondary | ICD-10-CM | POA: Diagnosis not present

## 2020-05-30 NOTE — Progress Notes (Signed)
Chief Complaint  Patient presents with  . Hospitalization Follow-up  . Back Pain  . Immunizations    discuss shingles shot     HPI: [name] 86 y.o. come in for post hfu  After  Surgery for spinal tenosis  Surgery and fu  Has dm neurogenic bladder   Renal insufficicny  Hx of breast cancer here with daughter today in wheelchair  She was hospitalized and in rehab from 10 26 - 11/10 dc  Physical medicine  So she has been home about 2 weeks about a week into it she had a fall where she got up and felt heavy in the head and fell onto her left side she did get evaluated by Dr. Arnoldo Morale who did x-rays of her hip I guess in back and said she was okay she had some midline chest pain at the nurse felt was chest wall because it was tender to touch.  She has had that symptom since then without associated shortness of breath nausea serious symptoms. Since then she feels like her left hand is weak because she has tingling in her fourth and fifth finger but no pain in her arm per se no bruising on that side but did have bruising on the right side. She is to follow-up for her anemia apparently lost a good deal of blood during the surgery and she is on iron. Of note in regard to her pain medicines she had been on them a while and stopped cold [place] for a few days and had withdrawal symptoms since then she is on a lower dose managing. Asked for refill of a muscle relaxant cyclobenzaprine she was given in rehab.  She is pretty much taking 5 mg at night to help sleep with muscle spasm. Her blood sugars have been pretty good no lows reported. She has medicines that she needs refilled sent to the mail away that includes gabapentin Lasix omeprazole and atorvastatin. To fu for dm monitoring  \ Pain meds as per spcialsits  ROS: See pertinent positives and negatives per HPI.  Past Medical History:  Diagnosis Date  . Anemia   . Arthritis   . Bilateral edema of lower extremity   . 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 Chronic lower back pain   . CKD (chronic kidney disease) stage 3, GFR 30-59 ml/min (HCC)   . DDD (degenerative disc disease)    lumbar  . Diverticulosis   . Family history of breast cancer   . Family history of pancreatic cancer   . Family history of prostate cancer   . Flaccid neuropathic bladder, not elsewhere classified   . Foley catheter in place   . GERD (gastroesophageal reflux disease)   . Hemorrhoids   . Hiatal hernia   . History of colon polyps   . History of radiation therapy 09-12-2015 to 10-12-2015   42.72 gray in 16 fractions directed right breast w/ boost of 12 gray in 6 fractions directed at the lumpectomy cavity- Total dose: 54.72y  . History of recurrent UTIs   . Hyperlipidemia   . PONV (postoperative nausea and vomiting)   . RBBB (right bundle branch block with left anterior fascicular block)   . Type 2 diabetes mellitus ([place])   . Urinary retention with incomplete  bladder emptying   . Wears dentures    full upper and lower partial  . Wears glasses   . Wears hearing aid    bilateral but does not wear    Family History  Problem Relation Age of Onset  . Pancreatic cancer Mother 48  . Diabetes Mother   . Prostate cancer Brother   . Diabetes Father   . Heart disease Father   . Diabetes Brother   . Heart attack Brother   . Diabetes Sister   . Diabetes Daughter   . Diabetes Son   . Coronary artery disease Brother        MI in his 81s  . Colon cancer Neg Hx   . Stomach cancer Neg Hx     Social History   Socioeconomic History  . Marital status: Married    Spouse name: Not on file  . Number of children: 2  . Years of education: Not on file  . Highest education level: Not on file  Occupational History  . Occupation:  Retired  Tobacco Use  . Smoking status: Never Smoker  . Smokeless tobacco: Never Used  Vaping Use  . Vaping Use: Never used  Substance and Sexual Activity  . Alcohol use: No  . Drug use: No  . Sexual activity: Never  Other Topics Concern  . Not on file  Social History Narrative   HH 2 married   Works for [employer] for 40 years retired   No pets    Lives w spouse; married in 20; 76 years    Social Determinants of Health   Financial Resource Strain:   . Difficulty of Paying Living Expenses: Not on file  Food Insecurity: No Food Insecurity  . Worried About Charity fundraiser in the Last Year: Never true  . Ran Out of Food in the Last Year: Never true  Transportation Needs: No Transportation Needs  . Lack of Transportation (Medical): No  . Lack of Transportation (Non-Medical): No  Physical Activity:   . Days of Exercise per Week: Not on file  . Minutes of Exercise per Session: Not on file  Stress:   . Feeling of Stress : Not on file  Social Connections:   . Frequency of Communication with Friends and Family: Not on file  . Frequency of Social Gatherings with Friends and Family: Not on file  . Attends Religious Services: Not on file  . Active Member of Clubs or Organizations: Not on file  . Attends Archivist Meetings: Not on file  . Marital Status: Not on file    Outpatient Medications Prior to Visit  Medication Sig Dispense Refill  . acetaminophen (TYLENOL) 500 MG tablet Take 500 mg by mouth every 6 (six) hours as needed for mild pain or moderate pain.     Marland Kitchen aspirin EC 81 MG tablet Take 81 mg by mouth daily at 12 noon.     . Cholecalciferol (VITAMIN D) 50 MCG (2000 UT) tablet Take 2,000 Units by mouth daily.    . cyanocobalamin 1000 MCG tablet Take 1,000 mcg by mouth at bedtime.     . docusate sodium (COLACE) 100 MG capsule Take 1 capsule (100 mg total) by mouth 2 (two) times daily. 60 capsule 0  . ferrous sulfate 325 (65 FE) MG tablet Take 1 tablet (325 mg  total) by mouth 2 (two) times daily with a meal.  3  . glipiZIDE (GLUCOTROL) 10 MG tablet Take 1 tablet (10 mg total) by  mouth in the morning. 90 tablet 3  . melatonin 3 MG TABS tablet Take 1 tablet (3 mg total) by mouth at bedtime as needed (insomnia). (Patient taking differently: Take 10 mg by mouth at bedtime as needed (insomnia). ) 30 tablet 0  . oxyCODONE-acetaminophen (PERCOCET/ROXICET) 5-325 MG tablet Take 1 tablet by mouth every 6 (six) hours as needed for severe pain. 28 tablet 0  . traMADol (ULTRAM) 50 MG tablet Take 1 tablet (50 mg total) by mouth every 6 (six) hours as needed. 28 tablet 0  . atorvastatin (LIPITOR) 10 MG tablet Take 1 tablet (10 mg total) by mouth daily. 30 tablet 0  . cyclobenzaprine (FLEXERIL) 10 MG tablet Take 0.5 tablets (5 mg total) by mouth 3 (three) times daily as needed for muscle spasms. 30 tablet 0  . esomeprazole (NEXIUM) 40 MG capsule TAKE 1 CAPSULE(40 MG) BY MOUTH DAILY (Patient taking differently: Take 40 mg by mouth daily. ) 90 capsule 1  . furosemide (LASIX) 40 MG tablet Take 1 tablet (40 mg total) by mouth daily. Or as directed 30 tablet 0  . gabapentin (NEURONTIN) 400 MG capsule Take 1 capsule (400 mg total) by mouth at bedtime. 30 capsule 0  . naloxegol oxalate (MOVANTIK) 25 MG TABS tablet Take 1 tablet (25 mg total) by mouth daily. (Patient not taking: Reported on 05/31/2020) 30 tablet 0  . polyethylene glycol (MIRALAX / GLYCOLAX) 17 g packet Take 17 g by mouth daily as needed for moderate constipation or severe constipation. (Patient not taking: Reported on 05/31/2020) 14 each 0  . lidocaine (LIDODERM) 5 % Place 1 patch onto the skin daily. Place on the right hip at 8 am and remove at 8 pm daily--has to be off for 12 hours. 30 patch 0  . Multiple Vitamins-Minerals (PRESERVISION AREDS PO) Take 1 capsule by mouth in the morning and at bedtime.     No facility-administered medications prior to visit.     EXAM:  BP 136/68 (BP Location: Left Arm,  Patient Position: Sitting, Cuff Size: Normal)   Pulse (!) 103   Temp 98.2 F (36.8 C) (Oral)   Ht 5' 2"  (1.575 m)   Wt 144 lb (65.3 kg)   SpO2 96%   BMI 26.34 kg/m   Body mass index is 26.34 kg/m.  GENERAL: vitals reviewed and listed above, alert, oriented, appears well hydrated and in no acute distress HEENT: atraumatic, conjunctiva  clear, no obvious abnormalities on inspection of external nose and ears OP : masked   NECK: no obvious masses on inspection palpation  LUNGS: clear to auscultation bilaterally, no wheezes, rales or rhonchi, good air movement CV: HRRR, no clubbing cyanosis or has a localized spot lower sternum of tenderness but no bruising crepitus nl cap refill  MS: moves all extremities without noticeable focal  abnormality left hand 4.5 out of 5 good motion no obvious atrophy pulses normal PSYCH: pleasant and cooperative, no obvious depression or anxiety cognition appears intact. Lab Results  Component Value Date   WBC 4.6 05/15/2020   HGB 9.3 (L) 05/15/2020   HCT 30.3 (L) 05/15/2020   PLT 399 05/15/2020   GLUCOSE 220 (H) 05/15/2020   CHOL 180 08/31/2019   TRIG 323.0 (H) 08/31/2019   HDL 35.80 (L) 08/31/2019   LDLDIRECT 81.0 08/31/2019   LDLCALC 83 12/16/2013   ALT 12 05/03/2020   AST 19 05/03/2020   NA 133 (L) 05/15/2020   K 4.1 05/15/2020   CL 94 (L) 05/15/2020   CREATININE 1.35 (  H) 05/15/2020   BUN 25 (H) 05/15/2020   CO2 30 05/15/2020   TSH 3.64 04/12/2020   INR 1.0 12/25/2019   HGBA1C 6.6 (H) 03/09/2020   MICROALBUR 10.3 (H) 08/31/2019   BP Readings from Last 3 Encounters:  05/31/20 136/68  05/17/20 (!) 109/52  05/02/20 (!) 100/50    ASSESSMENT AND PLAN:  Discussed the following assessment and plan:  Anemia, unspecified type - Plan: CBC with Differential/Platelet, BASIC METABOLIC PANEL WITH GFR, BASIC METABOLIC PANEL WITH GFR, CBC with Differential/Platelet  Type 2 diabetes mellitus with other specified complication, without long-term  current use of insulin ([place]) - Plan: CBC with Differential/Platelet, BASIC METABOLIC PANEL WITH GFR, BASIC METABOLIC PANEL WITH GFR, CBC with Differential/Platelet  Hospital discharge follow-up - Plan: CBC with Differential/Platelet, BASIC METABOLIC PANEL WITH GFR, BASIC METABOLIC PANEL WITH GFR, CBC with Differential/Platelet  Medication management - Plan: CBC with Differential/Platelet, BASIC METABOLIC PANEL WITH GFR, BASIC METABOLIC PANEL WITH GFR, CBC with Differential/Platelet  Hyperlipidemia, unspecified hyperlipidemia type  History of fall within past 90 days  Left hand weakness - Subjective related to ring and pinky finger numbness after the fall sounds mechanical and not otherwise neurologic will follow if worse evaluate  Chest wall pain - Presumed based on history associated symptoms follow closely if alarm findings Number of issues addressed today refill medicines as requested Caution with muscle relaxants prefer to be managed by back team however reasonable to give dosing of 5 mg at night as needed for muscle relaxant muscle spasm fall precautions. If any of your symptoms are persistent progressive we should reevaluate for alarm symptoms. Lab today we will plan follow-up in a few months depending. Shingles vaccine Shingrix can be obtained at her pharmacy.  Warned about temporary side effects Pain management by physical and surgery team. 50 minutes  Review   And visit face to face and counseling  -Patient advised to return or notify health care team  if  new concerns arise.  Patient Instructions  We will let you know when lab results are back and plan follow-up or 2 to 3 months.  We will send in small amount of Flexeril muscle relaxant to take at night if needed for muscle spasm but be cautious because it can cause mental fogginess and risk of falling. If requiring a large amount would have this be prescribed by your surgery rehab team.  In regard to the tingling of the left  lateral fingers this could be an elbow or nerve stretch from the fall do not see other alarming findings if getting worse over time we will get either hand or neurology to see you  If the chest soreness gets worse as far as associated symptoms shortness of breath illness related need evaluation.  We will refill the medicines that you have presented to your mail away.  Shingles vaccine called Shingrix at your pharmacy.       Standley Brooking. Jann Ra M.D.

## 2020-05-31 ENCOUNTER — Encounter: Payer: Self-pay | Admitting: Internal Medicine

## 2020-05-31 ENCOUNTER — Other Ambulatory Visit: Payer: Self-pay | Admitting: *Deleted

## 2020-05-31 ENCOUNTER — Ambulatory Visit (INDEPENDENT_AMBULATORY_CARE_PROVIDER_SITE_OTHER): Payer: Medicare Other | Admitting: Internal Medicine

## 2020-05-31 ENCOUNTER — Other Ambulatory Visit: Payer: Self-pay

## 2020-05-31 VITALS — BP 136/68 | HR 103 | Temp 98.2°F | Ht 62.0 in | Wt 144.0 lb

## 2020-05-31 DIAGNOSIS — E1169 Type 2 diabetes mellitus with other specified complication: Secondary | ICD-10-CM | POA: Diagnosis not present

## 2020-05-31 DIAGNOSIS — D649 Anemia, unspecified: Secondary | ICD-10-CM

## 2020-05-31 DIAGNOSIS — Z9181 History of falling: Secondary | ICD-10-CM

## 2020-05-31 DIAGNOSIS — E785 Hyperlipidemia, unspecified: Secondary | ICD-10-CM | POA: Diagnosis not present

## 2020-05-31 DIAGNOSIS — Z09 Encounter for follow-up examination after completed treatment for conditions other than malignant neoplasm: Secondary | ICD-10-CM

## 2020-05-31 DIAGNOSIS — R29898 Other symptoms and signs involving the musculoskeletal system: Secondary | ICD-10-CM | POA: Diagnosis not present

## 2020-05-31 DIAGNOSIS — Z79899 Other long term (current) drug therapy: Secondary | ICD-10-CM

## 2020-05-31 DIAGNOSIS — R0789 Other chest pain: Secondary | ICD-10-CM

## 2020-05-31 MED ORDER — ESOMEPRAZOLE MAGNESIUM 40 MG PO CPDR
40.0000 mg | DELAYED_RELEASE_CAPSULE | Freq: Every day | ORAL | 1 refills | Status: DC
Start: 2020-05-31 — End: 2021-05-07

## 2020-05-31 MED ORDER — ATORVASTATIN CALCIUM 10 MG PO TABS
10.0000 mg | ORAL_TABLET | Freq: Every day | ORAL | 1 refills | Status: DC
Start: 2020-05-31 — End: 2021-05-07

## 2020-05-31 MED ORDER — CYCLOBENZAPRINE HCL 10 MG PO TABS: 5.0000 mg | ORAL_TABLET | Freq: Every evening | ORAL | 0 refills | Status: DC | PRN

## 2020-05-31 MED ORDER — FUROSEMIDE 40 MG PO TABS
40.0000 mg | ORAL_TABLET | Freq: Every day | ORAL | 1 refills | Status: DC
Start: 2020-05-31 — End: 2020-11-21

## 2020-05-31 MED ORDER — GABAPENTIN 400 MG PO CAPS
400.0000 mg | ORAL_CAPSULE | Freq: Every day | ORAL | 1 refills | Status: DC
Start: 2020-05-31 — End: 2020-06-12

## 2020-05-31 NOTE — Patient Instructions (Signed)
We will let you know when lab results are back and plan follow-up or 2 to 3 months.  We will send in small amount of Flexeril muscle relaxant to take at night if needed for muscle spasm but be cautious because it can cause mental fogginess and risk of falling. If requiring a large amount would have this be prescribed by your surgery rehab team.  In regard to the tingling of the left lateral fingers this could be an elbow or nerve stretch from the fall do not see other alarming findings if getting worse over time we will get either hand or neurology to see you  If the chest soreness gets worse as far as associated symptoms shortness of breath illness related need evaluation.  We will refill the medicines that you have presented to your mail away.  Shingles vaccine called Shingrix at your pharmacy.

## 2020-05-31 NOTE — Patient Outreach (Signed)
[facility]) Care Management  05/31/2020  NONIE LOCHNER 10-Nov-1929 [id number]   Call placed to member's daughter [name] to follow up on surgery recovery and to complete initial assessment.  State member is improving slowly.  Report pain has been manageable with pain medications.  She is still working with home health for PT and nursing.  She was seen by the surgeon on this past Monday, visit was "good" and xray's show proper healing.  She will have another visit with rehab MD on 12/6, preparing to have visit with PCP today.  Appetite remains a concern, but daughter report she is eating, no weight loss.  State blood sugars have remained controlled.  Denies any urgent concerns, agrees to follow up within the next month.  Goals    . Exercise 150 minutes per week (moderate activity)     Will walk on the treadmill  Start out slow and low; and build minutes per week  May try stationary bike     . [facility] - Keep Pain Under Control     Follow Up Date 12/15   - bring pain diary to all appointments - call for medicine refill 2 or 3 days before it runs out - develop a personal pain management plan - keep track of prescription refills - plan exercise or activity when pain is best controlled - stay active - track times pain is worst and when it is best - track what makes the pain worse and what makes it better    Why is this important?   Day-to-day life can be hard when you have back pain.  Pain medicine is just one piece of the treatment puzzle. There are many things you can do to manage pain and keep your back strong.   Lifestyle changes, like stopping smoking and eating foods with Vitamin D and calcium, keep your bones and muscles healthy. Your back is better when it is supported by strong muscles.  You can try these action steps to help you manage your pain.     Notes:   11/24 - Pain control discussed with daughter, reviewed pain meds    . [facility] - Stay Active and  Independent     Follow Up Date 07/22/2020   - choose a type of activity I enjoy - get out for a short walk before breakfast, after dinner or both - plan family outings that include physical activity, like hiking, backpacking, swimming - walk indoors    Why is this important?   Regular activity or exercise is important to managing back pain.  Activity helps to keep your muscles strong.  You will sleep better and feel more relaxed.  You will have more energy and feel less stressed.  If you are not active now, start slowly. Little changes make a big difference.  Rest, but not too much.  Stay as active as you can and listen to your body's signals.     Notes:   11/24 -       Valente David, RN, MSN [place] [phone]

## 2020-06-01 LAB — CBC WITH DIFFERENTIAL/PLATELET
Absolute Monocytes: 543 cells/uL (ref 200–950)
Basophils Absolute: 59 cells/uL (ref 0–200)
Basophils Relative: 1 %
Eosinophils Absolute: 301 cells/uL (ref 15–500)
Eosinophils Relative: 5.1 %
HCT: 34.1 % — ABNORMAL LOW (ref 35.0–45.0)
Hemoglobin: 10.9 g/dL — ABNORMAL LOW (ref 11.7–15.5)
Lymphs Abs: 1440 cells/uL (ref 850–3900)
MCH: 28.8 pg (ref 27.0–33.0)
MCHC: 32 g/dL (ref 32.0–36.0)
MCV: 90.2 fL (ref 80.0–100.0)
MPV: 10 fL (ref 7.5–12.5)
Monocytes Relative: 9.2 %
Neutro Abs: 3558 cells/uL (ref 1500–7800)
Neutrophils Relative %: 60.3 %
Platelets: 393 10*3/uL (ref 140–400)
RBC: 3.78 10*6/uL — ABNORMAL LOW (ref 3.80–5.10)
RDW: 13.9 % (ref 11.0–15.0)
Total Lymphocyte: 24.4 %
WBC: 5.9 10*3/uL (ref 3.8–10.8)

## 2020-06-01 LAB — BASIC METABOLIC PANEL WITH GFR
BUN/Creatinine Ratio: 19 (calc) (ref 6–22)
BUN: 22 mg/dL (ref 7–25)
CO2: 28 mmol/L (ref 20–32)
Calcium: 9.8 mg/dL (ref 8.6–10.4)
Chloride: 93 mmol/L — ABNORMAL LOW (ref 98–110)
Creat: 1.15 mg/dL — ABNORMAL HIGH (ref 0.60–0.88)
GFR, Est African American: 49 mL/min/{1.73_m2} — ABNORMAL LOW (ref >=60)
GFR, Est Non African American: 42 mL/min/{1.73_m2} — ABNORMAL LOW (ref >=60)
Glucose, Bld: 148 mg/dL — ABNORMAL HIGH (ref 65–99)
Potassium: 5.6 mmol/L — ABNORMAL HIGH (ref 3.5–5.3)
Sodium: 130 mmol/L — ABNORMAL LOW (ref 135–146)

## 2020-06-05 ENCOUNTER — Telehealth: Payer: Self-pay

## 2020-06-05 DIAGNOSIS — Z79891 Long term (current) use of opiate analgesic: Secondary | ICD-10-CM | POA: Diagnosis not present

## 2020-06-05 DIAGNOSIS — M4316 Spondylolisthesis, lumbar region: Secondary | ICD-10-CM | POA: Diagnosis not present

## 2020-06-05 DIAGNOSIS — E1122 Type 2 diabetes mellitus with diabetic chronic kidney disease: Secondary | ICD-10-CM | POA: Diagnosis not present

## 2020-06-05 DIAGNOSIS — H9190 Unspecified hearing loss, unspecified ear: Secondary | ICD-10-CM | POA: Diagnosis not present

## 2020-06-05 DIAGNOSIS — N189 Chronic kidney disease, unspecified: Secondary | ICD-10-CM | POA: Diagnosis not present

## 2020-06-05 DIAGNOSIS — Z4789 Encounter for other orthopedic aftercare: Secondary | ICD-10-CM | POA: Diagnosis not present

## 2020-06-05 NOTE — Progress Notes (Signed)
Anemia is getting better but not yet at normal ( tup to  10.9  from 9.3 ) Kidney function  stable   potassium is slightly elevated   this can be from  blood draw effect or  too muc potassium  .  Stop the potassium supplement potassium supplement    for now    Check bmp in  about 1 week  hydrated not fasting   ok  dx elevated potassium(

## 2020-06-05 NOTE — Telephone Encounter (Signed)
Pt called and LVM stating she needs to r/s appt with Wilber Bihari, NP on 06/09/20 for 2 months out d/t recent back surgery. Mendel Ryder made aware and message sent to scheduling to arrange. Pt's daughter is aware scheduling will be in touch.

## 2020-06-06 ENCOUNTER — Telehealth: Payer: Self-pay | Admitting: Adult Health

## 2020-06-06 DIAGNOSIS — N189 Chronic kidney disease, unspecified: Secondary | ICD-10-CM | POA: Diagnosis not present

## 2020-06-06 DIAGNOSIS — Z4789 Encounter for other orthopedic aftercare: Secondary | ICD-10-CM | POA: Diagnosis not present

## 2020-06-06 DIAGNOSIS — M4316 Spondylolisthesis, lumbar region: Secondary | ICD-10-CM | POA: Diagnosis not present

## 2020-06-06 DIAGNOSIS — H9190 Unspecified hearing loss, unspecified ear: Secondary | ICD-10-CM | POA: Diagnosis not present

## 2020-06-06 DIAGNOSIS — E1122 Type 2 diabetes mellitus with diabetic chronic kidney disease: Secondary | ICD-10-CM | POA: Diagnosis not present

## 2020-06-06 DIAGNOSIS — Z79891 Long term (current) use of opiate analgesic: Secondary | ICD-10-CM | POA: Diagnosis not present

## 2020-06-06 NOTE — Telephone Encounter (Signed)
Scheduled appt per 11/29 sch msg - pt daughter is aware of appt date and time

## 2020-06-07 DIAGNOSIS — M4316 Spondylolisthesis, lumbar region: Secondary | ICD-10-CM | POA: Diagnosis not present

## 2020-06-07 DIAGNOSIS — N189 Chronic kidney disease, unspecified: Secondary | ICD-10-CM | POA: Diagnosis not present

## 2020-06-07 DIAGNOSIS — Z4789 Encounter for other orthopedic aftercare: Secondary | ICD-10-CM | POA: Diagnosis not present

## 2020-06-07 DIAGNOSIS — H9190 Unspecified hearing loss, unspecified ear: Secondary | ICD-10-CM | POA: Diagnosis not present

## 2020-06-07 DIAGNOSIS — Z79891 Long term (current) use of opiate analgesic: Secondary | ICD-10-CM | POA: Diagnosis not present

## 2020-06-07 DIAGNOSIS — E1122 Type 2 diabetes mellitus with diabetic chronic kidney disease: Secondary | ICD-10-CM | POA: Diagnosis not present

## 2020-06-08 ENCOUNTER — Other Ambulatory Visit: Payer: Self-pay

## 2020-06-08 DIAGNOSIS — E875 Hyperkalemia: Secondary | ICD-10-CM

## 2020-06-08 DIAGNOSIS — Z23 Encounter for immunization: Secondary | ICD-10-CM | POA: Diagnosis not present

## 2020-06-09 ENCOUNTER — Inpatient Hospital Stay: Payer: Medicare Other | Admitting: Adult Health

## 2020-06-12 ENCOUNTER — Encounter: Payer: Medicare Other | Attending: Physical Medicine & Rehabilitation | Admitting: Physical Medicine & Rehabilitation

## 2020-06-12 ENCOUNTER — Encounter: Payer: Self-pay | Admitting: Physical Medicine & Rehabilitation

## 2020-06-12 ENCOUNTER — Other Ambulatory Visit: Payer: Self-pay

## 2020-06-12 VITALS — BP 130/79 | HR 105 | Temp 98.3°F

## 2020-06-12 DIAGNOSIS — Z4789 Encounter for other orthopedic aftercare: Secondary | ICD-10-CM | POA: Diagnosis not present

## 2020-06-12 DIAGNOSIS — K5903 Drug induced constipation: Secondary | ICD-10-CM

## 2020-06-12 DIAGNOSIS — N319 Neuromuscular dysfunction of bladder, unspecified: Secondary | ICD-10-CM

## 2020-06-12 DIAGNOSIS — G479 Sleep disorder, unspecified: Secondary | ICD-10-CM

## 2020-06-12 DIAGNOSIS — G894 Chronic pain syndrome: Secondary | ICD-10-CM

## 2020-06-12 DIAGNOSIS — H9190 Unspecified hearing loss, unspecified ear: Secondary | ICD-10-CM | POA: Diagnosis not present

## 2020-06-12 DIAGNOSIS — E1122 Type 2 diabetes mellitus with diabetic chronic kidney disease: Secondary | ICD-10-CM | POA: Diagnosis not present

## 2020-06-12 DIAGNOSIS — M4316 Spondylolisthesis, lumbar region: Secondary | ICD-10-CM | POA: Diagnosis not present

## 2020-06-12 DIAGNOSIS — N189 Chronic kidney disease, unspecified: Secondary | ICD-10-CM | POA: Diagnosis not present

## 2020-06-12 DIAGNOSIS — M5416 Radiculopathy, lumbar region: Secondary | ICD-10-CM | POA: Diagnosis not present

## 2020-06-12 DIAGNOSIS — Z79891 Long term (current) use of opiate analgesic: Secondary | ICD-10-CM | POA: Diagnosis not present

## 2020-06-12 MED ORDER — GABAPENTIN 600 MG PO TABS: 600.0000 mg | ORAL_TABLET | Freq: Every day | ORAL | 1 refills | Status: DC

## 2020-06-12 MED ORDER — NALOXEGOL OXALATE 25 MG PO TABS
25.0000 mg | ORAL_TABLET | Freq: Every day | ORAL | 0 refills | Status: DC
Start: 2020-06-12 — End: 2020-07-11

## 2020-06-12 NOTE — Progress Notes (Signed)
Subjective:    Patient ID: [name], female    DOB: [birth year], 86 y.o.   MRN: [id number]  HPI Female with history of T2DM, neurogenic bladder, CKD, DDD with back and lower extremity pain with neurogenic claudication due to L2-L5 stenosis with spondylolisthesis presents for follow-up for re-do laminectomy with decompression of bilateral L3,L4,L5 nerve roots on 04/24/20.  Daughter supplements history. Since discharge patient states she is not able to sleep at night because she is restless..  She is not driving. CBGs have been relatively controlled. She is following up with PCP. She saw Neurosurg. She had a fall, does not know how/why soon after discharge.  Xrays were taken, which were stable.  She has not had a repeat episode.  She states he needs pain medications.  She is scheduled to have repeat Hb soon. She is doing well with foley. States she is having bleeding around it, appointment tomorrow. She remains constipated. Denies neck pain.    Therapies: 1-2/week DME: Previously owned Mobility: Walker at all times  Pain Inventory Average Pain 8 Pain Right Now 8 My pain is constant  In the last 24 hours, has pain interfered with the following? General activity 8 Relation with others 6 Enjoyment of life 10 What TIME of day is your pain at its worst? morning  and night Sleep (in general) Poor  Pain is worse with: walking, bending, sitting, inactivity, standing, unsure and some activites Pain improves with: medication Relief from Meds: 3  use a walker how many minutes can you walk? 5 ability to climb steps?  no do you drive?  no use a wheelchair transfers alone  retired I need assistance with the following:  meal prep and household duties  bowel control problems weakness tingling trouble walking confusion depression anxiety  HFU  HFU    Family History  Problem Relation Age of Onset  . Pancreatic cancer Mother 89  . Diabetes Mother   . Prostate cancer  Brother   . Diabetes Father   . Heart disease Father   . Diabetes Brother   . Heart attack Brother   . Diabetes Sister   . Diabetes Daughter   . Diabetes Son   . Coronary artery disease Brother        MI in his 54s  . Colon cancer Neg Hx   . Stomach cancer Neg Hx    Social History   Socioeconomic History  . Marital status: Married    Spouse name: Not on file  . Number of children: 2  . Years of education: Not on file  . Highest education level: Not on file  Occupational History  . Occupation: Retired  Tobacco Use  . Smoking status: Never Smoker  . Smokeless tobacco: Never Used  Vaping Use  . Vaping Use: Never used  Substance and Sexual Activity  . Alcohol use: No  . Drug use: No  . Sexual activity: Never  Other Topics Concern  . Not on file  Social History Narrative   HH 2 married   Works for [employer] for 40 years retired   No pets    Lives w spouse; married in 74; 61 years    Social Determinants of Health   Financial Resource Strain:   . Difficulty of Paying Living Expenses: Not on file  Food Insecurity: No Food Insecurity  . Worried About Charity fundraiser in the Last Year: Never true  . Ran Out of Food in the Last Year: Never true  Transportation Needs: No Transportation Needs  . Lack of Transportation (Medical): No  . Lack of Transportation (Non-Medical): No  Physical Activity:   . Days of Exercise per Week: Not on file  . Minutes of Exercise per Session: Not on file  Stress:   . Feeling of Stress : Not on file  Social Connections:   . Frequency of Communication with Friends and Family: Not on file  . Frequency of Social Gatherings with Friends and Family: Not on file  . Attends Religious Services: Not on file  . Active Member of Clubs or Organizations: Not on file  . Attends Archivist Meetings: Not on file  . Marital Status: Not on file   Past Surgical History:  Procedure Laterality Date  . BACK SURGERY    . BREAST LUMPECTOMY WITH  RADIOACTIVE SEED LOCALIZATION Right 08/03/2015   Procedure: BREAST LUMPECTOMY WITH RADIOACTIVE SEED LOCALIZATION;  Surgeon: [name], MD;  Location: [facility];  Service: General;  Laterality: Right;  . BREAST SURGERY    . CARDIOVASCULAR STRESS TEST  01/14/2012   Low risk nuclear study w/ small mild apical and apical lateral reversible perfusion defect represents a small area of ischemia versus shifting breast artifact/  normal LV funciton and wall motion, ef 86%  . CARPAL TUNNEL RELEASE Right 1977  . CATARACT EXTRACTION W/ INTRAOCULAR LENS  IMPLANT, BILATERAL Bilateral left 1996/  right 1997  . CLOSED RIGHT KNEE MANIPULATION  08/11/2002   post TKA  . CYSTOSTOMY N/A 05/17/2016   Procedure: CYSTOSCOPY WITH  SUPRAPUBIC PLACMENT;  Surgeon: [name], MD;  Location: [facility];  Service: Urology;  Laterality: N/A;  . EYE SURGERY    . GANGLION CYST EXCISION Right 12/09/2001   right palm  . HERNIA REPAIR    . JOINT REPLACEMENT    . KNEE ARTHROSCOPY Right 12/14/2002   w/ Lysis Adhesions  . LAMINECTOMY  04/24/2020   Bilateral redo L2-3, L3-4 and L4-5 laminotomy/laminectomy/foraminotomies/medial facetectomy to decompress the bilateral L3, L4 and L5 nerve roots  . LUMBAR LAMINECTOMY/DECOMPRESSION MICRODISCECTOMY N/A 03/06/2016   Procedure: CENTRAL DECOMPRESSION L3 - L4 ,L4 - L5;  Surgeon: [name], MD;  Location: [facility];  Service: Orthopedics;  Laterality: N/A;  . SHOULDER HEMI-ARTHROPLASTY Right 02/20/2009   avascular necrosis   . TOTAL KNEE ARTHROPLASTY Bilateral right 06-23-2002/  left 07-11-2008  . [place]  . VAGINAL HYSTERECTOMY  1975   Past Medical History:  Diagnosis Date  . Anemia   . Arthritis   . Bilateral edema of lower extremity   . 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 Chronic lower back pain   . CKD (chronic kidney disease) stage 3, GFR 30-59 ml/min (HCC)   . DDD (degenerative disc disease)    lumbar  . Diverticulosis   . Family history of breast cancer   . Family history of pancreatic cancer   . Family history of prostate cancer   . Flaccid neuropathic bladder, not elsewhere classified   . Foley catheter in place   . GERD (gastroesophageal reflux disease)   . Hemorrhoids   . Hiatal hernia   . History of colon polyps   . History of radiation therapy 09-12-2015 to 10-12-2015   42.72 gray in 16 fractions directed  right breast w/ boost of 12 gray in 6 fractions directed at the lumpectomy cavity- Total dose: 54.72y  . History of recurrent UTIs   . Hyperlipidemia   . PONV (postoperative nausea and vomiting)   . RBBB (right bundle branch block with left anterior fascicular block)   . Type 2 diabetes mellitus ([place])   . Urinary retention with incomplete bladder emptying   . Wears dentures    full upper and lower partial  . Wears glasses   . Wears hearing aid    bilateral but does not wear   BP 130/79   Pulse (!) 105   Temp 98.3 F (36.8 C)   SpO2 91%   Opioid Risk Score:   Fall Risk Score:  `1  Depression screen PHQ 2/9  Depression screen [facility] 2/9 06/12/2020 08/28/2018 09/12/2016 06/28/2016 06/06/2016 12/15/2015 08/17/2015  Decreased Interest 3 0 0 0 0 0 0  Down, Depressed, Hopeless 3 0 0 0 0 0 0  PHQ - 2 Score 6 0 0 0 0 0 0  Altered sleeping 3 - - - - - -  Tired, decreased energy 3 - - - - - -  Change in appetite 1 - - - - - -  Feeling bad or failure about yourself  2 - - - - - -  Trouble concentrating 3 - - - - - -  Moving slowly or fidgety/restless 3 - - - - - -  Suicidal thoughts 2 - - - - - -  PHQ-9 Score 23 - - - - - -  Difficult doing work/chores Very difficult - - - - - -  Some recent data might be hidden    Review of Systems  Musculoskeletal: Positive for  arthralgias, back pain, gait problem and myalgias.  Neurological: Positive for weakness and numbness.  Psychiatric/Behavioral: Positive for sleep disturbance.  All other systems reviewed and are negative.     Objective:   Physical Exam  Constitutional: No distress . Vital signs reviewed. HENT: Normocephalic.  Atraumatic. Eyes: EOMI. No discharge. Cardiovascular: No JVD.   Respiratory: Normal effort.  No stridor.   GI: Non-distended.   GU: +Foley Skin: Warm and dry.  Intact. Psych: Normal mood.  Normal behavior. Musc: Left foot edema No tenderness in extremities. Neuro: Alert and oriented. HOH Motor: B/l UE 5/5 Right lower extremity: 4-/5 hip flexion, knee extension, 5/5 ankle dorsiflexion Left lower extremity: Hip flexion, knee extension 4/5, ankle dorsiflexion 5/5     Assessment & Plan:  Female with history of T2DM, neurogenic bladder, CKD, DDD with back and lower extremity pain with neurogenic claudication due to L2-L5 stenosis with spondylolisthesis presents for follow-up for re-do laminectomy with decompression of bilateral L3,L4,L5 nerve roots on 04/24/20.  1.  Functional and mobility deficits secondary to lumbar stenosis with neurogenic claudication, s/p re-do laminectomy with decompression of bilateral L3,L4,L5 nerve roots on 04/24/20.             Continue therapies  2. Chronic Pain              Cont meds per Neurosurg  3.  T2DM with hyperglycemia:   Cont meds  Controlled at present  4.  Acute on chronic anemia:   Repeat labs per PCP  5.  Flaccid neurogenic bladder.    Chronic foley  Bleeding around tube per patient, follow up appointment tomorrow  6. Chronic drug induced constipation:              Continue Movantik 25 -  upon further questioning taking PRN, educated on taking daily  7. Gait abnormality  Cont walker for safety  8. Sleep disturbance  Cont melatonin  QTc prolonged on ECG reviewed,   Will increase Gabapentin to 600 qhs (max)  9. Peripheral  Edema  Recommended elevation

## 2020-06-13 DIAGNOSIS — E1122 Type 2 diabetes mellitus with diabetic chronic kidney disease: Secondary | ICD-10-CM | POA: Diagnosis not present

## 2020-06-13 DIAGNOSIS — Z79891 Long term (current) use of opiate analgesic: Secondary | ICD-10-CM | POA: Diagnosis not present

## 2020-06-13 DIAGNOSIS — N189 Chronic kidney disease, unspecified: Secondary | ICD-10-CM | POA: Diagnosis not present

## 2020-06-13 DIAGNOSIS — Z4789 Encounter for other orthopedic aftercare: Secondary | ICD-10-CM | POA: Diagnosis not present

## 2020-06-13 DIAGNOSIS — H9190 Unspecified hearing loss, unspecified ear: Secondary | ICD-10-CM | POA: Diagnosis not present

## 2020-06-13 DIAGNOSIS — M4316 Spondylolisthesis, lumbar region: Secondary | ICD-10-CM | POA: Diagnosis not present

## 2020-06-14 ENCOUNTER — Other Ambulatory Visit (INDEPENDENT_AMBULATORY_CARE_PROVIDER_SITE_OTHER): Payer: Medicare Other

## 2020-06-14 ENCOUNTER — Other Ambulatory Visit: Payer: Self-pay

## 2020-06-14 DIAGNOSIS — E875 Hyperkalemia: Secondary | ICD-10-CM

## 2020-06-14 DIAGNOSIS — E785 Hyperlipidemia, unspecified: Secondary | ICD-10-CM | POA: Diagnosis not present

## 2020-06-15 DIAGNOSIS — N312 Flaccid neuropathic bladder, not elsewhere classified: Secondary | ICD-10-CM | POA: Diagnosis not present

## 2020-06-15 LAB — BASIC METABOLIC PANEL
BUN/Creatinine Ratio: 21 (calc) (ref 6–22)
BUN: 26 mg/dL — ABNORMAL HIGH (ref 7–25)
CO2: 30 mmol/L (ref 20–32)
Calcium: 9.6 mg/dL (ref 8.6–10.4)
Chloride: 98 mmol/L (ref 98–110)
Creat: 1.25 mg/dL — ABNORMAL HIGH (ref 0.60–0.88)
Glucose, Bld: 138 mg/dL — ABNORMAL HIGH (ref 65–99)
Potassium: 4.4 mmol/L (ref 3.5–5.3)
Sodium: 137 mmol/L (ref 135–146)

## 2020-06-18 DIAGNOSIS — Z79891 Long term (current) use of opiate analgesic: Secondary | ICD-10-CM | POA: Diagnosis not present

## 2020-06-18 DIAGNOSIS — N189 Chronic kidney disease, unspecified: Secondary | ICD-10-CM | POA: Diagnosis not present

## 2020-06-18 DIAGNOSIS — M4316 Spondylolisthesis, lumbar region: Secondary | ICD-10-CM | POA: Diagnosis not present

## 2020-06-18 DIAGNOSIS — E1122 Type 2 diabetes mellitus with diabetic chronic kidney disease: Secondary | ICD-10-CM | POA: Diagnosis not present

## 2020-06-18 DIAGNOSIS — Z7982 Long term (current) use of aspirin: Secondary | ICD-10-CM | POA: Diagnosis not present

## 2020-06-18 DIAGNOSIS — Z466 Encounter for fitting and adjustment of urinary device: Secondary | ICD-10-CM | POA: Diagnosis not present

## 2020-06-18 DIAGNOSIS — H9190 Unspecified hearing loss, unspecified ear: Secondary | ICD-10-CM | POA: Diagnosis not present

## 2020-06-18 DIAGNOSIS — Z4789 Encounter for other orthopedic aftercare: Secondary | ICD-10-CM | POA: Diagnosis not present

## 2020-06-18 DIAGNOSIS — Z7984 Long term (current) use of oral hypoglycemic drugs: Secondary | ICD-10-CM | POA: Diagnosis not present

## 2020-06-19 DIAGNOSIS — M4316 Spondylolisthesis, lumbar region: Secondary | ICD-10-CM | POA: Diagnosis not present

## 2020-06-19 DIAGNOSIS — H9190 Unspecified hearing loss, unspecified ear: Secondary | ICD-10-CM | POA: Diagnosis not present

## 2020-06-19 DIAGNOSIS — Z79891 Long term (current) use of opiate analgesic: Secondary | ICD-10-CM | POA: Diagnosis not present

## 2020-06-19 DIAGNOSIS — E1122 Type 2 diabetes mellitus with diabetic chronic kidney disease: Secondary | ICD-10-CM | POA: Diagnosis not present

## 2020-06-19 DIAGNOSIS — Z4789 Encounter for other orthopedic aftercare: Secondary | ICD-10-CM | POA: Diagnosis not present

## 2020-06-19 DIAGNOSIS — N189 Chronic kidney disease, unspecified: Secondary | ICD-10-CM | POA: Diagnosis not present

## 2020-06-19 MED ORDER — MIRTAZAPINE 15 MG PO TABS: 7.5000 mg | ORAL_TABLET | Freq: Every day | ORAL | 1 refills | Status: DC

## 2020-06-19 NOTE — Telephone Encounter (Signed)
See telephone encounter under patient's husbands account. They will set up an appt with a provider in office.

## 2020-06-19 NOTE — Telephone Encounter (Signed)
I have prescribed Remeron for her.  Hope this works.  If she starts have palpitations, diarrhea, increased sweating, etc, then stop taking this medication. Thanks.

## 2020-06-20 DIAGNOSIS — N189 Chronic kidney disease, unspecified: Secondary | ICD-10-CM | POA: Diagnosis not present

## 2020-06-20 DIAGNOSIS — Z4789 Encounter for other orthopedic aftercare: Secondary | ICD-10-CM | POA: Diagnosis not present

## 2020-06-20 DIAGNOSIS — M4316 Spondylolisthesis, lumbar region: Secondary | ICD-10-CM | POA: Diagnosis not present

## 2020-06-20 DIAGNOSIS — E1122 Type 2 diabetes mellitus with diabetic chronic kidney disease: Secondary | ICD-10-CM | POA: Diagnosis not present

## 2020-06-20 DIAGNOSIS — Z79891 Long term (current) use of opiate analgesic: Secondary | ICD-10-CM | POA: Diagnosis not present

## 2020-06-20 DIAGNOSIS — H9190 Unspecified hearing loss, unspecified ear: Secondary | ICD-10-CM | POA: Diagnosis not present

## 2020-06-26 DIAGNOSIS — M4316 Spondylolisthesis, lumbar region: Secondary | ICD-10-CM | POA: Diagnosis not present

## 2020-06-26 DIAGNOSIS — H9190 Unspecified hearing loss, unspecified ear: Secondary | ICD-10-CM | POA: Diagnosis not present

## 2020-06-26 DIAGNOSIS — N189 Chronic kidney disease, unspecified: Secondary | ICD-10-CM | POA: Diagnosis not present

## 2020-06-26 DIAGNOSIS — E1122 Type 2 diabetes mellitus with diabetic chronic kidney disease: Secondary | ICD-10-CM | POA: Diagnosis not present

## 2020-06-26 DIAGNOSIS — Z79891 Long term (current) use of opiate analgesic: Secondary | ICD-10-CM | POA: Diagnosis not present

## 2020-06-26 DIAGNOSIS — Z4789 Encounter for other orthopedic aftercare: Secondary | ICD-10-CM | POA: Diagnosis not present

## 2020-06-26 MED ORDER — ZOLPIDEM TARTRATE 5 MG PO TABS: 2.5000 mg | ORAL_TABLET | Freq: Every evening | ORAL | 0 refills | Status: DC | PRN

## 2020-06-26 NOTE — Progress Notes (Signed)
Potassium better  kidney function stable

## 2020-06-26 NOTE — Telephone Encounter (Signed)
Ambien 2.5mg  qhs ordered.

## 2020-06-27 DIAGNOSIS — E1122 Type 2 diabetes mellitus with diabetic chronic kidney disease: Secondary | ICD-10-CM | POA: Diagnosis not present

## 2020-06-27 DIAGNOSIS — M4316 Spondylolisthesis, lumbar region: Secondary | ICD-10-CM | POA: Diagnosis not present

## 2020-06-27 DIAGNOSIS — N189 Chronic kidney disease, unspecified: Secondary | ICD-10-CM | POA: Diagnosis not present

## 2020-06-27 DIAGNOSIS — Z4789 Encounter for other orthopedic aftercare: Secondary | ICD-10-CM | POA: Diagnosis not present

## 2020-06-27 DIAGNOSIS — H9190 Unspecified hearing loss, unspecified ear: Secondary | ICD-10-CM | POA: Diagnosis not present

## 2020-06-27 DIAGNOSIS — Z79891 Long term (current) use of opiate analgesic: Secondary | ICD-10-CM | POA: Diagnosis not present

## 2020-06-28 ENCOUNTER — Other Ambulatory Visit: Payer: Self-pay | Admitting: *Deleted

## 2020-06-28 ENCOUNTER — Encounter: Payer: Self-pay | Admitting: *Deleted

## 2020-06-28 NOTE — Patient Outreach (Signed)
[facility]) Care Management  06/28/2020  BREYANA FOLLANSBEE 04-28-1930 [id number]   Call placed to daughter to follow up on ongoing recovery of back surgery.  She report member is slowly improving, but still having a hard time with sleeping.  She has tried melatonin, Ambien, and Remeron, all reportedly does not work.  Noted per chart that Trazadone was discussed, daughter will call Dr. Posey Pronto to inquire about an order.  She is unsure why member is not able to sleep, admit that a neighbor gave her Xanax which provided some relief.  Advised against taking medication not prescribed to her, she verbalizes understanding.  This care manager inquired about member possibly having anxiety or depression which may contribute to sleep issues, daughter is unaware.  She is not with the member currently, state she did not stay there last night.    Outgoing call placed to member directly, she report not being able to relax enough to fall asleep.  Denies anxiety however PHQ9 score elevated at 21.  She agrees to contact with CSW for counseling.  Also state she is still having issues with pain, took Tylenol today, also has Oxycodone and Tramadol for pain relief, taking as prescribed.  Denies any urgent concerns, encouraged to contact this care manager with questions.  Agrees to follow up within the next month.  Will place referral to CSW for counseling and elevated PHQ9.  Goals Addressed            This Visit's Progress   . [facility] - Keep Pain Under Control   On track    Follow Up Date 07/25/2020  Timeframe:  Short-Term Goal Priority:  Medium Start Date:     06/28/2020                        Expected End Date:       07/29/2020                  - bring pain diary to all appointments - call for medicine refill 2 or 3 days before it runs out - develop a personal pain management plan - keep track of prescription refills - plan exercise or activity when pain is best controlled - stay active - track  times pain is worst and when it is best - track what makes the pain worse and what makes it better    Why is this important?   Day-to-day life can be hard when you have back pain.  Pain medicine is just one piece of the treatment puzzle. There are many things you can do to manage pain and keep your back strong.   Lifestyle changes, like stopping smoking and eating foods with Vitamin D and calcium, keep your bones and muscles healthy. Your back is better when it is supported by strong muscles.  You can try these action steps to help you manage your pain.     Notes:   11/24 - Pain control discussed with daughter, reviewed pain meds    . [facility] - Stay Active and Independent   On track    Follow Up Date 07/25/2020   Timeframe:  Long-Range Goal Priority:  High Start Date:      06/28/2020                       Expected End Date:    08/29/2020                   -  choose a type of activity I enjoy - get out for a short walk before breakfast, after dinner or both - plan family outings that include physical activity, like hiking, backpacking, swimming - walk indoors    Why is this important?   Regular activity or exercise is important to managing back pain.  Activity helps to keep your muscles strong.  You will sleep better and feel more relaxed.  You will have more energy and feel less stressed.  If you are not active now, start slowly. Little changes make a big difference.  Rest, but not too much.  Stay as active as you can and listen to your body's signals.     Notes:   12/22 - Referral to CSW for counseling in effort to improve quality of care      Valente David, RN, MSN [place] [phone]

## 2020-06-28 NOTE — Patient Outreach (Signed)
[facility]) Care Management  06/28/2020  [name] 25-Mar-1930 [id number]   CSW made an initial attempt to try and contact patient today to perform the phone assessment, as well as assess and assist with social work needs and services, without success.  A HIPAA compliant message was left for patient on voicemail.  CSW is currently awaiting a return call.  CSW will make a second outreach attempt within the next 3-4 business days, if a return call is not received from patient in the meantime.  CSW will also mail a Patient Unsuccessful Outreach Letter to patient's home, requesting that she contact CSW if she is interested in receiving social work services through [place] with [facility].  [name], BSW, MSW, LCSW  Licensed Education officer, environmental Health System  Mailing [place] [address] [place], [place] [address] Physical Address-[address] [place], [place] [address] Toll Free Main # [phone] Fax # [phone] Cell # [phone]  [email or web address]

## 2020-06-29 ENCOUNTER — Telehealth: Payer: Self-pay | Admitting: Adult Health

## 2020-06-29 ENCOUNTER — Telehealth: Payer: Self-pay | Admitting: Internal Medicine

## 2020-06-29 NOTE — Telephone Encounter (Signed)
I was out of office  Until dec 20  So  No involved with these messages   she was given  Ambien byu her pain doctor .  Had tele visit with husband today  And told her to inc the  Ambien  And get back with  Prescribing physician.  Confusing   overlap of prescribing proividers.   alprazolam is not a sleep med and is high risk med  but can be used with caution for break through anxiety   . See phone note   Wit husband today

## 2020-06-29 NOTE — Telephone Encounter (Signed)
Left message to reschedule appointment due to provider on call.

## 2020-06-29 NOTE — Telephone Encounter (Signed)
Discussed sleep difficulties and restlessness with Ms. Hughlett over the phone during the phone visit for Mr. Mogle.  Mr. Sheaffer is having great fatigue and nerve problems because his wife now in rehab after her back surgery and prolonged course is not able to sleep at night and is getting up very frequently despite pain not being the cause. Dr. Posey Pronto sent in Ambien 2.5 mg 3 days ago and she has taken this for 2 nights and it has not helped at all. She is not on pain meds at night just melatonin Tylenol CBT Gummies that she was on before.  Mr. Platt symptoms are felt at this time to be from his wife's being up at night.  And at this time would not add medication for him.  Also noticed that although mirtazapine was prescribed on December 13 ...15 pills to take half of a 15 or 7.5 mg at night she never remembers getting this and does not have that medicine nor has tried it.  At this time she will try 5 mg at night over the holidays i.e. double up with caution contact us next week and Dr. Posey Pronto next week about how this is going and I will communicate this note to him.  Consideration of trial of the mirtazapine also.

## 2020-07-03 ENCOUNTER — Telehealth: Payer: Self-pay | Admitting: *Deleted

## 2020-07-03 DIAGNOSIS — E1122 Type 2 diabetes mellitus with diabetic chronic kidney disease: Secondary | ICD-10-CM | POA: Diagnosis not present

## 2020-07-03 DIAGNOSIS — Z79891 Long term (current) use of opiate analgesic: Secondary | ICD-10-CM | POA: Diagnosis not present

## 2020-07-03 DIAGNOSIS — N189 Chronic kidney disease, unspecified: Secondary | ICD-10-CM | POA: Diagnosis not present

## 2020-07-03 DIAGNOSIS — M4316 Spondylolisthesis, lumbar region: Secondary | ICD-10-CM | POA: Diagnosis not present

## 2020-07-03 DIAGNOSIS — H9190 Unspecified hearing loss, unspecified ear: Secondary | ICD-10-CM | POA: Diagnosis not present

## 2020-07-03 DIAGNOSIS — Z4789 Encounter for other orthopedic aftercare: Secondary | ICD-10-CM | POA: Diagnosis not present

## 2020-07-03 MED ORDER — MIRTAZAPINE 7.5 MG PO TABS
7.5000 mg | ORAL_TABLET | Freq: Every day | ORAL | 1 refills | Status: DC
Start: 2020-07-03 — End: 2020-07-11

## 2020-07-03 NOTE — Telephone Encounter (Signed)
I was on vacation since last week.  I have tried Remeron for her, but informed by family that it was ineffective.  Nonetheless, we do not typically strictly manage sleep medications on an outpatient basis, as such, if you have a plan in place, I recommend her continue to follow-up with you.  If she does start to develop any symptoms from her fall, I would recommend follow-up with urgent care.  Thanks.

## 2020-07-03 NOTE — Telephone Encounter (Signed)
[name] PTA [facility] called to report that the patient told her today at her visit with Mrs Barrett that she had fallen backwards from her walker this morning around 1:30 am.  She was not hurt but thinks her walker got hung on something.  No need to call back.

## 2020-07-03 NOTE — Telephone Encounter (Signed)
Husband's MyChart update about [name] states that she still was not able to sleep with the higher dose of Ambien. Also noted there was a fall backwards without resulting injury over the weekend. No input from dr Posey Pronto as of  Yet.     12:09 PM [name] is still not sleeping even after taking the full dose of ambien. We are all so frustrated and worn out. Im not sure who you  are referring as being an SOB.  So stop the ambien  I will send in the remeron to try at night never filled from  Rehab  Let me know how doing at end of week

## 2020-07-04 ENCOUNTER — Telehealth: Payer: Self-pay | Admitting: Adult Health

## 2020-07-04 NOTE — Telephone Encounter (Signed)
Rescheduled follow-up appointment per 12/28 schedule message. Patient's daughter is aware of changes.

## 2020-07-05 ENCOUNTER — Encounter: Payer: Self-pay | Admitting: *Deleted

## 2020-07-05 ENCOUNTER — Other Ambulatory Visit: Payer: Self-pay | Admitting: *Deleted

## 2020-07-05 NOTE — Patient Outreach (Signed)
[facility]) Care Management  07/05/2020  [name] June 29, 1930 [id number]   CSW was able to make initial contact with patient today to perform phone assessment, as well as assess and assist with social work needs and services.  CSW introduced self, explained role and types of services provided through [facility] ([place] [facility] [facility]).  CSW further explained to patient that CSW works with patient's RNCM, also with [facility], [name].  CSW then explained the reason for the call, indicating that [name] thought that patient would benefit from social work services and resources to assist with counseling and support for symptoms of anxiety and depression.  CSW obtained two HIPAA compliant identifiers from patient, which included patient's name and date of birth.  Patient stated that she was having a difficult time falling asleep and staying asleep throughout the night, but since she has started taking a sleep aid (Ambien 5 MG, PO, Daily) she is able to "drift off to sleep and stay asleep until morning, feeling rested when awakened".  Patient admitted that her lack of sleep was really taking a toll on her body, causing her become anxious, irritable, depressed, unmotivated, isolative and tired all the time.  Patient reported that her symptoms of anxiety and depression have decreased significantly, denying the need for counseling services through [facility].  CSW offered to provide patient with a list of counseling resources, in addition to a list of reputable counselors, psychiatrists and/or psychologists, but patient politely declined.  CSW explained to patient that CSW is willing to reassess her mental status again in one week, as patient indicated that she will be weaning off of the Ambien, prescribed to her while hospitalized, and will begin taking Remeron, prescribed to her by her Primary Care Physician, Dr. Shanon [name].  Patient was agreeable  to this plan, but requested that CSW contact Dr. Regis [name] to see if she would be willing to prescribe her Xanax 5 MG, PO, Daily, instead of the Remeron.  CSW agreed to route this note to Dr. Regis [name], including patient's request.  Patient denies feeling homicidal or suicidal at present, nor is patient a victim of domestic violence.  Patient's PHQ-2 and PHQ-9 Depression Screening results were much lower today, in comparison to just one week ago.    Patient reported that she has 4 daughters, but that only 2 of them are local and able to offer assistance to her and her husband, [name].  Patient further reported having a granddaughter that works close-by, checking on them everyday during her lunch break.  Patient continues to receive home health nursing and physical therapy services through [facility]).  Patient admitted that she could really use some additional assistance around the house, with bathing, dressing, meal preparation, grocery shopping, medication administration, light housekeeping duties, etc.  CSW explained to patient that she is eligible to apply for Aid and Attendance Benefits through [employer], as her husband is a retired English as a second language teacher.    Patient indicated that she was not aware of this benefit, and that no one at [facility] bothered to share this information with her, even though her husband is now 100% disabled, as a result of time spent serving.  CSW agreed to mail patient the following list of resource information and applications, agreeing to assist with completion and submission of the applications, if necessary: [employer] - How to Apply for Aid and Attendance; [place]; [employer] -  Statement in Support of Claim; [place] to [employer]; [place] and Careers adviser.    Patient was most appreciative  of the call and of CSW's willingness to offer assistance.  Patient was agreeable to having CSW contact her again next week, on Thursday, July 13, 2020, around 10:00AM, to ensure that she received the packet of resource information and applications, answer any questions that she may have pertaining to the information received, and reassess her need and willingness to receive counseling and supportive services through [facility] or elsewhere.  CSW was able to confirm that patient has the correct contact information for CSW, encouraging her to contact CSW directly if she changes her mind about wanting to receive counseling services, or if additional social work needs arise in the meantime.  [name], [name], [name], [name]  Licensed Education officer, environmental Health System  Mailing [place] [address], [place], [place] [address] Physical Address-[address] [place] [address] [place], [place] [address] Toll Free Main # [phone] Fax # [phone] Cell # [phone]  [name]

## 2020-07-07 DIAGNOSIS — Z4789 Encounter for other orthopedic aftercare: Secondary | ICD-10-CM | POA: Diagnosis not present

## 2020-07-07 DIAGNOSIS — M4316 Spondylolisthesis, lumbar region: Secondary | ICD-10-CM | POA: Diagnosis not present

## 2020-07-07 DIAGNOSIS — H9190 Unspecified hearing loss, unspecified ear: Secondary | ICD-10-CM | POA: Diagnosis not present

## 2020-07-07 DIAGNOSIS — N189 Chronic kidney disease, unspecified: Secondary | ICD-10-CM | POA: Diagnosis not present

## 2020-07-07 DIAGNOSIS — E1122 Type 2 diabetes mellitus with diabetic chronic kidney disease: Secondary | ICD-10-CM | POA: Diagnosis not present

## 2020-07-07 DIAGNOSIS — Z79891 Long term (current) use of opiate analgesic: Secondary | ICD-10-CM | POA: Diagnosis not present

## 2020-07-10 NOTE — Telephone Encounter (Signed)
There are so many messages and contacts this is a confusing thread  Dr Posey Pronto says he is not planning on managing the sleep issue further   So we will try    Please have Ms m Dunnigan  List  What t meds she has taken for sleep and anxiety in the past  7 days and what was helpful .( and make sure med list is updated)   We can go form there  If needed we can send in  10 pills  of lorazepam  ( like a xanax) for a short term trial   (But want to avoid combined multiple medications that could cause a problem  )

## 2020-07-11 ENCOUNTER — Other Ambulatory Visit: Payer: Self-pay

## 2020-07-11 ENCOUNTER — Encounter: Payer: Self-pay | Admitting: Physical Medicine & Rehabilitation

## 2020-07-11 ENCOUNTER — Ambulatory Visit (HOSPITAL_COMMUNITY)
Admission: RE | Admit: 2020-07-11 | Discharge: 2020-07-11 | Disposition: A | Discharge status: Home / Self Care | Payer: Medicare Other | Source: Ambulatory Visit | Attending: Physical Medicine & Rehabilitation | Admitting: Physical Medicine & Rehabilitation

## 2020-07-11 ENCOUNTER — Encounter: Payer: Medicare Other | Attending: Physical Medicine & Rehabilitation | Admitting: Physical Medicine & Rehabilitation

## 2020-07-11 VITALS — BP 126/66 | HR 101 | Temp 98.6°F | Ht 62.0 in | Wt 143.2 lb

## 2020-07-11 DIAGNOSIS — K5903 Drug induced constipation: Secondary | ICD-10-CM | POA: Insufficient documentation

## 2020-07-11 DIAGNOSIS — R609 Edema, unspecified: Secondary | ICD-10-CM | POA: Insufficient documentation

## 2020-07-11 DIAGNOSIS — G894 Chronic pain syndrome: Secondary | ICD-10-CM | POA: Diagnosis not present

## 2020-07-11 DIAGNOSIS — M5416 Radiculopathy, lumbar region: Secondary | ICD-10-CM | POA: Diagnosis not present

## 2020-07-11 DIAGNOSIS — N319 Neuromuscular dysfunction of bladder, unspecified: Secondary | ICD-10-CM | POA: Diagnosis not present

## 2020-07-11 DIAGNOSIS — G479 Sleep disorder, unspecified: Secondary | ICD-10-CM | POA: Diagnosis not present

## 2020-07-11 MED ORDER — SENNOSIDES-DOCUSATE SODIUM 8.6-50 MG PO TABS: 2.0000 | ORAL_TABLET | Freq: Two times a day (BID) | ORAL | 2 refills | Status: DC

## 2020-07-11 MED ORDER — NALOXEGOL OXALATE 25 MG PO TABS
25.0000 mg | ORAL_TABLET | Freq: Every day | ORAL | 0 refills | Status: DC
Start: 2020-07-11 — End: 2020-08-10

## 2020-07-11 MED ORDER — GABAPENTIN 600 MG PO TABS: 300.0000 mg | ORAL_TABLET | Freq: Every day | ORAL | 0 refills | Status: DC

## 2020-07-11 NOTE — Progress Notes (Signed)
Left lower extremity venous study completed.     Please see CV Proc for preliminary results.   Jaquila Santelli, RVT  

## 2020-07-11 NOTE — Progress Notes (Signed)
Subjective:    Patient ID: [name], female    DOB: [birth year], 87 y.o.   MRN: [id number]  HPI Female with history of T2DM, neurogenic bladder, CKD, DDD with back and lower extremity pain with neurogenic claudication due to L2-L5 stenosis with spondylolisthesis presents for follow-up for re-do laminectomy with decompression of bilateral L3,L4,L5 nerve roots on 04/24/20.  Daughter supplements history.  Last clinic visit on 06/12/2020.  Since that time significant communication exchanged with family as well as primary care physician regarding sleep, sleep meds, interventions.  Since that time, patient/family state pt is no longer in therapies. She is doing HEP. She is taking Oxy/Tramadol. She continues with foley with bleeding around tube.  She is following up with Urology. She states she is still constipated.  She had a fall when her walk got caught on chair.  She uses walker for ambulation. She notes persistent edema.  Sleep is improved with multiple meds. Left leg feels "funny", when asked to describe she says "no".  Pain Inventory Average Pain 7 Pain Right Now 0 My pain is constant, burning and aching  In the last 24 hours, has pain interfered with the following? General activity 7 Relation with others 0 Enjoyment of life 10 What TIME of day is your pain at its worst? morning  and night Sleep (in general) Poor  Pain is worse with: standing Pain improves with: medication Relief from Meds: 9  Family History  Problem Relation Age of Onset  . Pancreatic cancer Mother 45  . Diabetes Mother   . Prostate cancer Brother   . Diabetes Father   . Heart disease Father   . Diabetes Brother   . Heart attack Brother   . Diabetes Sister   . Diabetes Daughter   . Diabetes Son   . Coronary artery disease Brother        MI in his 77s  . Colon cancer Neg Hx   . Stomach cancer Neg Hx    Social History   Socioeconomic History  . Marital status: Married    Spouse name: Phyllis Abelson   . Number of children: 4  . Years of education: 66  . Highest education level: 12th grade  Occupational History  . Occupation: Retired  Tobacco Use  . Smoking status: Never Smoker  . Smokeless tobacco: Never Used  Vaping Use  . Vaping Use: Never used  Substance and Sexual Activity  . Alcohol use: No  . Drug use: No  . Sexual activity: Never  Other Topics Concern  . Not on file  Social History Narrative   HH 2 married   Works for [employer] for 40 years retired   No pets    Lives w spouse; married in 30; 70 years    Social Determinants of Health   Financial Resource Strain: Low Risk   . Difficulty of Paying Living Expenses: Not hard at all  Food Insecurity: No Food Insecurity  . Worried About Charity fundraiser in the Last Year: Never true  . Ran Out of Food in the Last Year: Never true  Transportation Needs: No Transportation Needs  . Lack of Transportation (Medical): No  . Lack of Transportation (Non-Medical): No  Physical Activity: Inactive  . Days of Exercise per Week: 0 days  . Minutes of Exercise per Session: 0 min  Stress: No Stress Concern Present  . Feeling of Stress : Only a little  Social Connections: Unknown  . Frequency of Communication with Friends and  Family: More than three times a week  . Frequency of Social Gatherings with Friends and Family: More than three times a week  . Attends Religious Services: Not on file  . Active Member of Clubs or Organizations: No  . Attends Archivist Meetings: Never  . Marital Status: Married   Past Surgical History:  Procedure Laterality Date  . BACK SURGERY    . BREAST LUMPECTOMY WITH RADIOACTIVE SEED LOCALIZATION Right 08/03/2015   Procedure: BREAST LUMPECTOMY WITH RADIOACTIVE SEED LOCALIZATION;  Surgeon: [name], MD;  Location: [facility];  Service: General;  Laterality: Right;  . BREAST SURGERY    . CARDIOVASCULAR STRESS TEST  01/14/2012   Low risk nuclear study w/ small mild  apical and apical lateral reversible perfusion defect represents a small area of ischemia versus shifting breast artifact/  normal LV funciton and wall motion, ef 86%  . CARPAL TUNNEL RELEASE Right 1977  . CATARACT EXTRACTION W/ INTRAOCULAR LENS  IMPLANT, BILATERAL Bilateral left 1996/  right 1997  . CLOSED RIGHT KNEE MANIPULATION  08/11/2002   post TKA  . CYSTOSTOMY N/A 05/17/2016   Procedure: CYSTOSCOPY WITH  SUPRAPUBIC PLACMENT;  Surgeon: [name], MD;  Location: [facility];  Service: Urology;  Laterality: N/A;  . EYE SURGERY    . GANGLION CYST EXCISION Right 12/09/2001   right palm  . HERNIA REPAIR    . JOINT REPLACEMENT    . KNEE ARTHROSCOPY Right 12/14/2002   w/ Lysis Adhesions  . LAMINECTOMY  04/24/2020   Bilateral redo L2-3, L3-4 and L4-5 laminotomy/laminectomy/foraminotomies/medial facetectomy to decompress the bilateral L3, L4 and L5 nerve roots  . LUMBAR LAMINECTOMY/DECOMPRESSION MICRODISCECTOMY N/A 03/06/2016   Procedure: CENTRAL DECOMPRESSION L3 - L4 ,L4 - L5;  Surgeon: [name], MD;  Location: [facility];  Service: Orthopedics;  Laterality: N/A;  . SHOULDER HEMI-ARTHROPLASTY Right 02/20/2009   avascular necrosis   . TOTAL KNEE ARTHROPLASTY Bilateral right 06-23-2002/  left 07-11-2008  . [place]  . VAGINAL HYSTERECTOMY  1975   Past Medical History:  Diagnosis Date  . Anemia   . Arthritis   . Bilateral edema of lower extremity   . 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 Chronic lower back pain   . CKD (chronic kidney disease) stage 3, GFR 30-59 ml/min (HCC)   . DDD (degenerative disc disease)    lumbar  . Diverticulosis   . Family history of breast cancer   . Family history of pancreatic cancer   . Family history of  prostate cancer   . Flaccid neuropathic bladder, not elsewhere classified   . Foley catheter in place   . GERD (gastroesophageal reflux disease)   . Hemorrhoids   . Hiatal hernia   . History of colon polyps   . History of radiation therapy 09-12-2015 to 10-12-2015   42.72 gray in 16 fractions directed right breast w/ boost of 12 gray in 6 fractions directed at the lumpectomy cavity- Total dose: 54.72y  . History of recurrent UTIs   . Hyperlipidemia   . PONV (postoperative nausea and vomiting)   . RBBB (right bundle branch block with left anterior fascicular block)   . Type 2 diabetes mellitus ([place])   . Urinary retention with incomplete bladder emptying   .  Wears dentures    full upper and lower partial  . Wears glasses   . Wears hearing aid    bilateral but does not wear   BP 126/66   Pulse (!) 101   Temp 98.6 F (37 C)   Ht 5' 2"  (1.575 m)   Wt 143 lb 3.2 oz (65 kg) Comment: reported by patient  SpO2 91%   BMI 26.19 kg/m   Opioid Risk Score:   Fall Risk Score:  `1  Depression screen PHQ 2/9  Depression screen [facility] 2/9 07/05/2020 06/28/2020 06/12/2020 08/28/2018 09/12/2016 06/28/2016 06/06/2016  Decreased Interest 2 3 3  0 0 0 0  Down, Depressed, Hopeless 2 3 3  0 0 0 0  PHQ - 2 Score 4 6 6  0 0 0 0  Altered sleeping 1 3 3  - - - -  Tired, decreased energy 1 3 3  - - - -  Change in appetite 3 3 1  - - - -  Feeling bad or failure about yourself  0 0 2 - - - -  Trouble concentrating 3 3 3  - - - -  Moving slowly or fidgety/restless 3 3 3  - - - -  Suicidal thoughts 0 0 2 - - - -  PHQ-9 Score 15 21 23  - - - -  Difficult doing work/chores Very difficult Very difficult Very difficult - - - -  Some recent data might be hidden    Review of Systems  Constitutional: Negative.   HENT: Negative.   Eyes: Negative.   Respiratory: Negative.   Cardiovascular: Negative.   Gastrointestinal: Negative.   Endocrine: Negative.   Genitourinary:       Foley  Musculoskeletal: Positive for  arthralgias, back pain, gait problem and myalgias.  Skin: Negative.   Allergic/Immunologic: Negative.   Neurological: Positive for weakness and numbness.  Hematological: Negative.   Psychiatric/Behavioral: Positive for sleep disturbance.  All other systems reviewed and are negative.     Objective:   Physical Exam  Constitutional: No distress . Vital signs reviewed. HENT: Normocephalic.  Atraumatic. Eyes: EOMI. No discharge. Cardiovascular: No JVD.   Respiratory: Normal effort.  No stridor.   GI: Non-distended.   GI: Foley Skin: Warm and dry.  Intact. Psych: Normal mood.  Normal behavior. Musc: LE edema. No tenderness in extremities. ?+ Homan's on LLE Tight left calf Neuro: Alert and oriented. HOH Motor:  Right lower extremity: 3+/5 hip flexion, 4-/5 knee extension, 5/5 ankle dorsiflexion Left lower extremity: Hip flexion, knee extension 4-/5, ankle dorsiflexion 5/5    Assessment & Plan:  Female with history of T2DM, neurogenic bladder, CKD, DDD with back and lower extremity pain with neurogenic claudication due to L2-L5 stenosis with spondylolisthesis presents for follow-up for re-do laminectomy with decompression of bilateral L3,L4,L5 nerve roots on 04/24/20.  1.  Functional and mobility deficits secondary to lumbar stenosis with neurogenic claudication, s/p re-do laminectomy with decompression of bilateral L3,L4,L5 nerve roots on 04/24/20.             Continue HEP  Cont follow up with Neurosurg  2. Chronic Pain              Meds per Neurosurg  3.  Flaccid neurogenic bladder.    Chronic foley  Bleeding around tube   Continue to follow up with Urology  4. Chronic drug induced constipation:              Continue Movantik 25   Will order Senna-s 2 tab BID   5.  Gait abnormality  Continue walker for safety  6. Sleep disturbance  No benefit with Remeron  Being managed by PCP  Wean Gabapentin due to ?"jerking"  7. Peripheral Edema  Follow up with Cards  Cont  diuretics per PCP  Patient states she had LE U/S rule out DVT, which was negative and does not another one, later agreeable  Limit salt intake

## 2020-07-13 ENCOUNTER — Telehealth: Payer: Self-pay | Admitting: *Deleted

## 2020-07-13 ENCOUNTER — Other Ambulatory Visit: Payer: Self-pay | Admitting: *Deleted

## 2020-07-13 DIAGNOSIS — H9190 Unspecified hearing loss, unspecified ear: Secondary | ICD-10-CM | POA: Diagnosis not present

## 2020-07-13 DIAGNOSIS — N189 Chronic kidney disease, unspecified: Secondary | ICD-10-CM | POA: Diagnosis not present

## 2020-07-13 DIAGNOSIS — Z4789 Encounter for other orthopedic aftercare: Secondary | ICD-10-CM | POA: Diagnosis not present

## 2020-07-13 DIAGNOSIS — E1122 Type 2 diabetes mellitus with diabetic chronic kidney disease: Secondary | ICD-10-CM | POA: Diagnosis not present

## 2020-07-13 DIAGNOSIS — Z79891 Long term (current) use of opiate analgesic: Secondary | ICD-10-CM | POA: Diagnosis not present

## 2020-07-13 DIAGNOSIS — M4316 Spondylolisthesis, lumbar region: Secondary | ICD-10-CM | POA: Diagnosis not present

## 2020-07-13 NOTE — Patient Outreach (Signed)
[facility]) Care Management  07/13/2020  [name] 1929/08/14 [id number]   CSW made an attempt to try and contact patient today to follow-up regarding social work services and resources, as well as to ensure that she received the packet of resource information and applications that CSW mailed to her home last week; however, she was unavailable at the time of CSW's call.  CSW left a HIPAA compliant message on voicemail for patient and will await a return call.  If a return call is not received from patient within the next 3-4 business days, CSW will make a second outreach attempt.  [name], BSW, MSW, LCSW  Licensed Education officer, environmental Health System  Mailing [place] [address] [place], [place] [address] Physical Address-[address], [place], [place] [address] Toll Free Main # [phone] Fax # [phone] Cell # [phone]  [email or web address]

## 2020-07-13 NOTE — Telephone Encounter (Signed)
[name] from [facility] called to request ext PT 2wk4, 1wk4.  [name] is progressing but very slowly. She can stand for only a short period and has very weak core muscles.  Approval given for extension.

## 2020-07-14 DIAGNOSIS — Z79891 Long term (current) use of opiate analgesic: Secondary | ICD-10-CM | POA: Diagnosis not present

## 2020-07-14 DIAGNOSIS — M4316 Spondylolisthesis, lumbar region: Secondary | ICD-10-CM | POA: Diagnosis not present

## 2020-07-14 DIAGNOSIS — N189 Chronic kidney disease, unspecified: Secondary | ICD-10-CM | POA: Diagnosis not present

## 2020-07-14 DIAGNOSIS — H9190 Unspecified hearing loss, unspecified ear: Secondary | ICD-10-CM | POA: Diagnosis not present

## 2020-07-14 DIAGNOSIS — Z4789 Encounter for other orthopedic aftercare: Secondary | ICD-10-CM | POA: Diagnosis not present

## 2020-07-14 DIAGNOSIS — E1122 Type 2 diabetes mellitus with diabetic chronic kidney disease: Secondary | ICD-10-CM | POA: Diagnosis not present

## 2020-07-14 MED ORDER — LORAZEPAM 0.5 MG PO TABS
0.2500 mg | ORAL_TABLET | Freq: Every evening | ORAL | 1 refills | Status: DC | PRN
Start: 2020-07-14 — End: 2020-08-21

## 2020-07-14 NOTE — Telephone Encounter (Signed)
So not sure any specific medicine is going to help perfectly.  Thanks for the information. Since alprazolam sample seem to help anxiety we can try lorazepam in the short run. It is not supposed to be recommended for sleep or a use on a long-term basis but we can try it to see if we can get you out of the hole.  And back to reasonable sleep.  Sending in prescription to your pharmacy.  Do not take the Ambien if you are taking this medicine.  Please let me know if this is helping at all maybe take it 3 or 4 nights a week until feeling better and then occasional.

## 2020-07-18 DIAGNOSIS — Z9841 Cataract extraction status, right eye: Secondary | ICD-10-CM | POA: Diagnosis not present

## 2020-07-18 DIAGNOSIS — Z9842 Cataract extraction status, left eye: Secondary | ICD-10-CM | POA: Diagnosis not present

## 2020-07-18 DIAGNOSIS — E785 Hyperlipidemia, unspecified: Secondary | ICD-10-CM | POA: Diagnosis not present

## 2020-07-18 DIAGNOSIS — Z466 Encounter for fitting and adjustment of urinary device: Secondary | ICD-10-CM | POA: Diagnosis not present

## 2020-07-18 DIAGNOSIS — Z7982 Long term (current) use of aspirin: Secondary | ICD-10-CM | POA: Diagnosis not present

## 2020-07-18 DIAGNOSIS — Z923 Personal history of irradiation: Secondary | ICD-10-CM | POA: Diagnosis not present

## 2020-07-18 DIAGNOSIS — I4510 Unspecified right bundle-branch block: Secondary | ICD-10-CM | POA: Diagnosis not present

## 2020-07-18 DIAGNOSIS — K5790 Diverticulosis of intestine, part unspecified, without perforation or abscess without bleeding: Secondary | ICD-10-CM | POA: Diagnosis not present

## 2020-07-18 DIAGNOSIS — M1990 Unspecified osteoarthritis, unspecified site: Secondary | ICD-10-CM | POA: Diagnosis not present

## 2020-07-18 DIAGNOSIS — M48062 Spinal stenosis, lumbar region with neurogenic claudication: Secondary | ICD-10-CM | POA: Diagnosis not present

## 2020-07-18 DIAGNOSIS — Z96653 Presence of artificial knee joint, bilateral: Secondary | ICD-10-CM | POA: Diagnosis not present

## 2020-07-18 DIAGNOSIS — Z853 Personal history of malignant neoplasm of breast: Secondary | ICD-10-CM | POA: Diagnosis not present

## 2020-07-18 DIAGNOSIS — K219 Gastro-esophageal reflux disease without esophagitis: Secondary | ICD-10-CM | POA: Diagnosis not present

## 2020-07-18 DIAGNOSIS — N183 Chronic kidney disease, stage 3 unspecified: Secondary | ICD-10-CM | POA: Diagnosis not present

## 2020-07-18 DIAGNOSIS — Z7984 Long term (current) use of oral hypoglycemic drugs: Secondary | ICD-10-CM | POA: Diagnosis not present

## 2020-07-18 DIAGNOSIS — E1122 Type 2 diabetes mellitus with diabetic chronic kidney disease: Secondary | ICD-10-CM | POA: Diagnosis not present

## 2020-07-18 DIAGNOSIS — Z171 Estrogen receptor negative status [ER-]: Secondary | ICD-10-CM | POA: Diagnosis not present

## 2020-07-18 DIAGNOSIS — Z87440 Personal history of urinary (tract) infections: Secondary | ICD-10-CM | POA: Diagnosis not present

## 2020-07-18 DIAGNOSIS — Z90710 Acquired absence of both cervix and uterus: Secondary | ICD-10-CM | POA: Diagnosis not present

## 2020-07-18 DIAGNOSIS — D631 Anemia in chronic kidney disease: Secondary | ICD-10-CM | POA: Diagnosis not present

## 2020-07-18 DIAGNOSIS — N319 Neuromuscular dysfunction of bladder, unspecified: Secondary | ICD-10-CM | POA: Diagnosis not present

## 2020-07-18 DIAGNOSIS — M4316 Spondylolisthesis, lumbar region: Secondary | ICD-10-CM | POA: Diagnosis not present

## 2020-07-18 DIAGNOSIS — Z961 Presence of intraocular lens: Secondary | ICD-10-CM | POA: Diagnosis not present

## 2020-07-18 DIAGNOSIS — M5136 Other intervertebral disc degeneration, lumbar region: Secondary | ICD-10-CM | POA: Diagnosis not present

## 2020-07-18 DIAGNOSIS — Z96611 Presence of right artificial shoulder joint: Secondary | ICD-10-CM | POA: Diagnosis not present

## 2020-07-18 NOTE — Telephone Encounter (Signed)
Do I need to send it again to a different phralmacy?  Or did she pick it up?

## 2020-07-19 ENCOUNTER — Other Ambulatory Visit: Payer: Self-pay | Admitting: *Deleted

## 2020-07-19 ENCOUNTER — Encounter: Payer: Self-pay | Admitting: *Deleted

## 2020-07-19 DIAGNOSIS — M48062 Spinal stenosis, lumbar region with neurogenic claudication: Secondary | ICD-10-CM | POA: Diagnosis not present

## 2020-07-19 DIAGNOSIS — E1122 Type 2 diabetes mellitus with diabetic chronic kidney disease: Secondary | ICD-10-CM | POA: Diagnosis not present

## 2020-07-19 DIAGNOSIS — M5136 Other intervertebral disc degeneration, lumbar region: Secondary | ICD-10-CM | POA: Diagnosis not present

## 2020-07-19 DIAGNOSIS — D631 Anemia in chronic kidney disease: Secondary | ICD-10-CM | POA: Diagnosis not present

## 2020-07-19 DIAGNOSIS — N183 Chronic kidney disease, stage 3 unspecified: Secondary | ICD-10-CM | POA: Diagnosis not present

## 2020-07-19 DIAGNOSIS — M4316 Spondylolisthesis, lumbar region: Secondary | ICD-10-CM | POA: Diagnosis not present

## 2020-07-19 NOTE — Patient Outreach (Signed)
[facility]) Care Management  07/19/2020  [name] January 22, 1930 [id number]   CSW was able to make contact with patient today to follow-up regarding social work services and resources, as well as to ensure that she received the packet of resource information and applications that CSW mailed to her home on Wednesday, July 05, 2020.  Patient confirmed receipt, indicating that she gave the entire packet to her daughter, Festus Barren for her to review and complete.  CSW confirmed that patient has the correct contact information for CSW, encouraging her to provide Mrs. [name] with CSW's contact information, in the event that she has questions or needs assistance with completion of the applications for Aid and Attendance Benefits, as well as Survivor Benefits.  Patient voiced understanding and was agreeable to this plan.  Patient denied the need for ongoing counseling and supportive services, reporting, "I believe I've gotten it all straightened out now".  Patient indicated that she is working with her Primary Care Physician, Dr. Shanon Ace, to obtain her psychotropic medications, hoping to be able to include an antianxiety medication to her current medication regimen.  Patient admitted that her appetite and sleep patterns have improved, no longer having a difficult time falling asleep or inability to stay asleep throughout the night.  Patient continues to receive home health nursing and physical therapy services through [facility], denying the need for additional services or assistance in the home.  CSW will perform a case closure on patient, as all goals of treatment have been met from social work standpoint and no additional social work needs have been identified at this time.  CSW will notify patient's RNCM with [facility], Valente David of CSW's plans to close patient's case.  CSW will fax an update to patient's Primary Care Physician, Dr. Shanon Ace to ensure that she is aware of CSW's involvement with patient's plan of care, in addition to routing a Physician Case Closure Letter.    [name], BSW, MSW, LCSW  Licensed Education officer, environmental Health System  Mailing [place] [address] [place], [place] [address] Physical Address-[address] [place],  [address] Toll Free Main # [phone] Fax # [phone] Cell # [phone]  [email or web address]

## 2020-07-21 DIAGNOSIS — M4316 Spondylolisthesis, lumbar region: Secondary | ICD-10-CM | POA: Diagnosis not present

## 2020-07-21 DIAGNOSIS — M5136 Other intervertebral disc degeneration, lumbar region: Secondary | ICD-10-CM | POA: Diagnosis not present

## 2020-07-21 DIAGNOSIS — N183 Chronic kidney disease, stage 3 unspecified: Secondary | ICD-10-CM | POA: Diagnosis not present

## 2020-07-21 DIAGNOSIS — D631 Anemia in chronic kidney disease: Secondary | ICD-10-CM | POA: Diagnosis not present

## 2020-07-21 DIAGNOSIS — E1122 Type 2 diabetes mellitus with diabetic chronic kidney disease: Secondary | ICD-10-CM | POA: Diagnosis not present

## 2020-07-21 DIAGNOSIS — M48062 Spinal stenosis, lumbar region with neurogenic claudication: Secondary | ICD-10-CM | POA: Diagnosis not present

## 2020-07-22 DIAGNOSIS — M48062 Spinal stenosis, lumbar region with neurogenic claudication: Secondary | ICD-10-CM | POA: Diagnosis not present

## 2020-07-22 DIAGNOSIS — N183 Chronic kidney disease, stage 3 unspecified: Secondary | ICD-10-CM | POA: Diagnosis not present

## 2020-07-22 DIAGNOSIS — E1122 Type 2 diabetes mellitus with diabetic chronic kidney disease: Secondary | ICD-10-CM | POA: Diagnosis not present

## 2020-07-22 DIAGNOSIS — M5136 Other intervertebral disc degeneration, lumbar region: Secondary | ICD-10-CM | POA: Diagnosis not present

## 2020-07-22 DIAGNOSIS — M4316 Spondylolisthesis, lumbar region: Secondary | ICD-10-CM | POA: Diagnosis not present

## 2020-07-22 DIAGNOSIS — D631 Anemia in chronic kidney disease: Secondary | ICD-10-CM | POA: Diagnosis not present

## 2020-07-24 DIAGNOSIS — D631 Anemia in chronic kidney disease: Secondary | ICD-10-CM | POA: Diagnosis not present

## 2020-07-24 DIAGNOSIS — M4316 Spondylolisthesis, lumbar region: Secondary | ICD-10-CM | POA: Diagnosis not present

## 2020-07-24 DIAGNOSIS — M48062 Spinal stenosis, lumbar region with neurogenic claudication: Secondary | ICD-10-CM | POA: Diagnosis not present

## 2020-07-24 DIAGNOSIS — N183 Chronic kidney disease, stage 3 unspecified: Secondary | ICD-10-CM | POA: Diagnosis not present

## 2020-07-24 DIAGNOSIS — E1122 Type 2 diabetes mellitus with diabetic chronic kidney disease: Secondary | ICD-10-CM | POA: Diagnosis not present

## 2020-07-24 DIAGNOSIS — M5136 Other intervertebral disc degeneration, lumbar region: Secondary | ICD-10-CM | POA: Diagnosis not present

## 2020-07-25 ENCOUNTER — Other Ambulatory Visit: Payer: Self-pay | Admitting: *Deleted

## 2020-07-25 NOTE — Patient Outreach (Signed)
[facility]) Care Management  [place]  07/25/2020   [name] 1929-12-26 [id number]   Outgoing call placed to member, state she is still slowly recovering from her back surgery.  Does not feel she is getting stronger as fast as she would like to.  Remains active with home health PT and nursing, continues to use walker for ambulation.  She is able to perform her ADL's, taking her time and doing so slowly.  Her husband remains active in care, preparing meals, daughter also active in providing support.    Swelling remains in lower extremities, Lasix was increased from 40 mg to 60 mg.  Foley catheter still in place, changed every 6 weeks, due to replacement this week, scheduled for 1/20, daughter will provide transportation.  Also has appointments with rehab physician on 2/3 and PCP on 2/7.  Denies any urgent concerns, encouraged to contact this care manager with questions.   Encounter Medications:  Outpatient Encounter Medications as of 07/25/2020  Medication Sig Note  . acetaminophen (TYLENOL) 500 MG tablet Take 500 mg by mouth every 6 (six) hours as needed for mild pain or moderate pain.    Marland Kitchen aspirin EC 81 MG tablet Take 81 mg by mouth daily at 12 noon.  04/25/2020: ON HOLD for procedure  . atorvastatin (LIPITOR) 10 MG tablet Take 1 tablet (10 mg total) by mouth daily.   . Cholecalciferol (VITAMIN D) 50 MCG (2000 UT) tablet Take 2,000 Units by mouth daily. 04/25/2020: ON HOLD for procedure  . cyanocobalamin 1000 MCG tablet Take 1,000 mcg by mouth at bedtime.  04/25/2020: ON HOLD for procedure  . cyclobenzaprine (FLEXERIL) 10 MG tablet Take 0.5 tablets (5 mg total) by mouth at bedtime as needed for muscle spasms.   Marland Kitchen docusate sodium (COLACE) 100 MG capsule Take 1 capsule (100 mg total) by mouth 2 (two) times daily.   Marland Kitchen esomeprazole (NEXIUM) 40 MG capsule Take 1 capsule (40 mg total) by mouth daily.   . furosemide (LASIX) 40 MG tablet Take 1 tablet (40 mg total)  by mouth daily. Or as directed   . gabapentin (NEURONTIN) 600 MG tablet Take 0.5 tablets (300 mg total) by mouth at bedtime.   Marland Kitchen glipiZIDE (GLUCOTROL) 10 MG tablet Take 1 tablet (10 mg total) by mouth in the morning.   Marland Kitchen LORazepam (ATIVAN) 0.5 MG tablet Take 0.5-1 tablets (0.25-0.5 mg total) by mouth at bedtime as needed for anxiety or sleep (do not take with narcotics or ambien).   . melatonin 3 MG TABS tablet Take 1 tablet (3 mg total) by mouth at bedtime as needed (insomnia). (Patient taking differently: Take 10 mg by mouth at bedtime as needed (insomnia).)   . naloxegol oxalate (MOVANTIK) 25 MG TABS tablet Take 1 tablet (25 mg total) by mouth daily.   Marland Kitchen oxyCODONE-acetaminophen (PERCOCET/ROXICET) 5-325 MG tablet Take 1 tablet by mouth every 6 (six) hours as needed for severe pain. 07/11/2020: Fill date 05/17/20 #28  today #5 last dose last PM  (cutting pill in half)  . polyethylene glycol (MIRALAX / GLYCOLAX) 17 g packet Take 17 g by mouth daily as needed for moderate constipation or severe constipation.   . senna-docusate (SENOKOT-S) 8.6-50 MG tablet Take 2 tablets by mouth 2 (two) times daily.   . traMADol (ULTRAM) 50 MG tablet Take 1 tablet (50 mg total) by mouth every 6 (six) hours as needed. 07/11/2020: Fill date 05/17/20 #28  today #13.5 last taken pm  cuts tablets in  half  . zolpidem (AMBIEN) 5 MG tablet Take 0.5 tablets (2.5 mg total) by mouth at bedtime as needed for sleep.    No facility-administered encounter medications on file as of 07/25/2020.    Functional Status:  In your present state of health, do you have any difficulty performing the following activities: 07/05/2020 05/02/2020  Hearing? N Y  [place]? N N  Difficulty concentrating or making decisions? N N  Walking or climbing stairs? Y Y  Comment Impaired balance and gait -  Dressing or bathing? Y N  Comment Impaired balance and gait -  Doing errands, shopping? Y Y  Comment Impaired balance and gait -   Preparing Food and eating ? Y -  Comment Impaired balance and gait -  Using the Toilet? N -  In the past six months, have you accidently leaked urine? Y -  Comment Occasional urinary incontinence -  Do you have problems with loss of bowel control? N -  Managing your Medications? N -  Managing your Finances? N -  Housekeeping or managing your Housekeeping? Y -  Comment Impaired balance and gait -  Some recent data might be hidden    Fall/Depression Screening: Fall Risk  07/05/2020 07/05/2020 06/12/2020  Falls in the past year? 1 1 1   Comment - - -  Number falls in past yr: 1 1 0  Injury with Fall? 1 1 0  Risk for fall due to : History of fall(s);Impaired balance/gait;Impaired mobility - -  Follow up Education provided;Falls prevention discussed - -   PHQ 2/9 Scores 07/05/2020 06/28/2020 06/12/2020 05/18/2020 08/28/2018 09/12/2016 06/28/2016  PHQ - 2 Score 4 6 6  - 0 0 0  PHQ- 9 Score 15 21 23  - - - -  Exception Documentation - - - (No Data) - - -    Assessment:  Goals Addressed            This Visit's Progress   . [facility] - Keep Pain Under Control   On track    Follow Up Date 08/22/2020  Timeframe:  Short-Term Goal Priority:  Medium Start Date:     06/28/2020                        Expected End Date:       07/29/2020                  - bring pain diary to all appointments - call for medicine refill 2 or 3 days before it runs out - develop a personal pain management plan - keep track of prescription refills - plan exercise or activity when pain is best controlled - stay active - track times pain is worst and when it is best - track what makes the pain worse and what makes it better    Why is this important?   Day-to-day life can be hard when you have back pain.  Pain medicine is just one piece of the treatment puzzle. There are many things you can do to manage pain and keep your back strong.   Lifestyle changes, like stopping smoking and eating foods with Vitamin D and  calcium, keep your bones and muscles healthy. Your back is better when it is supported by strong muscles.  You can try these action steps to help you manage your pain.     Notes:   11/24 - Pain control discussed with daughter, reviewed pain meds    .  [facility] - Stay Active and Independent   On track    Follow Up Date 08/22/2020   Timeframe:  Long-Range Goal Priority:  High Start Date:      06/28/2020                       Expected End Date:    08/29/2020                   - choose a type of activity I enjoy - get out for a short walk before breakfast, after dinner or both - plan family outings that include physical activity, like hiking, backpacking, swimming - walk indoors    Why is this important?   Regular activity or exercise is important to managing back pain.  Activity helps to keep your muscles strong.  You will sleep better and feel more relaxed.  You will have more energy and feel less stressed.  If you are not active now, start slowly. Little changes make a big difference.  Rest, but not too much.  Stay as active as you can and listen to your body's signals.     Notes:   12/22 - Referral to CSW for counseling in effort to improve quality of care  1/18 - Remains active with [facility] for nursing and PT       Plan:  Follow-up:  Patient agrees to Care Plan and Follow-up.  Will follow up within the next month.  Valente David, [place], MSN [place] [phone]

## 2020-07-26 DIAGNOSIS — N183 Chronic kidney disease, stage 3 unspecified: Secondary | ICD-10-CM | POA: Diagnosis not present

## 2020-07-26 DIAGNOSIS — M48062 Spinal stenosis, lumbar region with neurogenic claudication: Secondary | ICD-10-CM | POA: Diagnosis not present

## 2020-07-26 DIAGNOSIS — D631 Anemia in chronic kidney disease: Secondary | ICD-10-CM | POA: Diagnosis not present

## 2020-07-26 DIAGNOSIS — M4316 Spondylolisthesis, lumbar region: Secondary | ICD-10-CM | POA: Diagnosis not present

## 2020-07-26 DIAGNOSIS — M5136 Other intervertebral disc degeneration, lumbar region: Secondary | ICD-10-CM | POA: Diagnosis not present

## 2020-07-26 DIAGNOSIS — E1122 Type 2 diabetes mellitus with diabetic chronic kidney disease: Secondary | ICD-10-CM | POA: Diagnosis not present

## 2020-07-27 ENCOUNTER — Telehealth: Payer: Self-pay

## 2020-07-27 DIAGNOSIS — N312 Flaccid neuropathic bladder, not elsewhere classified: Secondary | ICD-10-CM | POA: Diagnosis not present

## 2020-07-27 NOTE — Telephone Encounter (Signed)
Musician called to request in home nursing care for SP cath care, home safety, DM care &  pain. Home care for once a week for 9 weeks.   Please advise.  Call back ph [phone]

## 2020-07-27 NOTE — Telephone Encounter (Signed)
I am not managing her CP cath, pain, and DM.  She can refer to individual providers or her PCP for general care.  Thanks.

## 2020-07-28 DIAGNOSIS — E1122 Type 2 diabetes mellitus with diabetic chronic kidney disease: Secondary | ICD-10-CM | POA: Diagnosis not present

## 2020-07-28 DIAGNOSIS — N183 Chronic kidney disease, stage 3 unspecified: Secondary | ICD-10-CM | POA: Diagnosis not present

## 2020-07-28 DIAGNOSIS — D631 Anemia in chronic kidney disease: Secondary | ICD-10-CM | POA: Diagnosis not present

## 2020-07-28 DIAGNOSIS — M4316 Spondylolisthesis, lumbar region: Secondary | ICD-10-CM | POA: Diagnosis not present

## 2020-07-28 DIAGNOSIS — M48062 Spinal stenosis, lumbar region with neurogenic claudication: Secondary | ICD-10-CM | POA: Diagnosis not present

## 2020-07-28 DIAGNOSIS — M5136 Other intervertebral disc degeneration, lumbar region: Secondary | ICD-10-CM | POA: Diagnosis not present

## 2020-07-28 NOTE — Telephone Encounter (Signed)
Home health has been advised. They will reach out to her PCP,

## 2020-07-31 DIAGNOSIS — D631 Anemia in chronic kidney disease: Secondary | ICD-10-CM | POA: Diagnosis not present

## 2020-07-31 DIAGNOSIS — M4316 Spondylolisthesis, lumbar region: Secondary | ICD-10-CM | POA: Diagnosis not present

## 2020-07-31 DIAGNOSIS — M5136 Other intervertebral disc degeneration, lumbar region: Secondary | ICD-10-CM | POA: Diagnosis not present

## 2020-07-31 DIAGNOSIS — M48062 Spinal stenosis, lumbar region with neurogenic claudication: Secondary | ICD-10-CM | POA: Diagnosis not present

## 2020-07-31 DIAGNOSIS — N183 Chronic kidney disease, stage 3 unspecified: Secondary | ICD-10-CM | POA: Diagnosis not present

## 2020-07-31 DIAGNOSIS — E1122 Type 2 diabetes mellitus with diabetic chronic kidney disease: Secondary | ICD-10-CM | POA: Diagnosis not present

## 2020-08-02 DIAGNOSIS — M5136 Other intervertebral disc degeneration, lumbar region: Secondary | ICD-10-CM | POA: Diagnosis not present

## 2020-08-02 DIAGNOSIS — M48062 Spinal stenosis, lumbar region with neurogenic claudication: Secondary | ICD-10-CM | POA: Diagnosis not present

## 2020-08-02 DIAGNOSIS — D631 Anemia in chronic kidney disease: Secondary | ICD-10-CM | POA: Diagnosis not present

## 2020-08-02 DIAGNOSIS — N183 Chronic kidney disease, stage 3 unspecified: Secondary | ICD-10-CM | POA: Diagnosis not present

## 2020-08-02 DIAGNOSIS — M4316 Spondylolisthesis, lumbar region: Secondary | ICD-10-CM | POA: Diagnosis not present

## 2020-08-02 DIAGNOSIS — E1122 Type 2 diabetes mellitus with diabetic chronic kidney disease: Secondary | ICD-10-CM | POA: Diagnosis not present

## 2020-08-04 DIAGNOSIS — E1122 Type 2 diabetes mellitus with diabetic chronic kidney disease: Secondary | ICD-10-CM | POA: Diagnosis not present

## 2020-08-04 DIAGNOSIS — M5136 Other intervertebral disc degeneration, lumbar region: Secondary | ICD-10-CM | POA: Diagnosis not present

## 2020-08-04 DIAGNOSIS — N183 Chronic kidney disease, stage 3 unspecified: Secondary | ICD-10-CM | POA: Diagnosis not present

## 2020-08-04 DIAGNOSIS — M48062 Spinal stenosis, lumbar region with neurogenic claudication: Secondary | ICD-10-CM | POA: Diagnosis not present

## 2020-08-04 DIAGNOSIS — D631 Anemia in chronic kidney disease: Secondary | ICD-10-CM | POA: Diagnosis not present

## 2020-08-04 DIAGNOSIS — M4316 Spondylolisthesis, lumbar region: Secondary | ICD-10-CM | POA: Diagnosis not present

## 2020-08-07 DIAGNOSIS — N183 Chronic kidney disease, stage 3 unspecified: Secondary | ICD-10-CM | POA: Diagnosis not present

## 2020-08-07 DIAGNOSIS — E1122 Type 2 diabetes mellitus with diabetic chronic kidney disease: Secondary | ICD-10-CM | POA: Diagnosis not present

## 2020-08-07 DIAGNOSIS — M5136 Other intervertebral disc degeneration, lumbar region: Secondary | ICD-10-CM | POA: Diagnosis not present

## 2020-08-07 DIAGNOSIS — M4316 Spondylolisthesis, lumbar region: Secondary | ICD-10-CM | POA: Diagnosis not present

## 2020-08-07 DIAGNOSIS — M48062 Spinal stenosis, lumbar region with neurogenic claudication: Secondary | ICD-10-CM | POA: Diagnosis not present

## 2020-08-07 DIAGNOSIS — D631 Anemia in chronic kidney disease: Secondary | ICD-10-CM | POA: Diagnosis not present

## 2020-08-08 ENCOUNTER — Other Ambulatory Visit: Payer: Self-pay | Admitting: Physical Medicine & Rehabilitation

## 2020-08-09 ENCOUNTER — Other Ambulatory Visit: Payer: Self-pay | Admitting: Physical Medicine & Rehabilitation

## 2020-08-09 NOTE — Telephone Encounter (Signed)
Last clinic note states "Wean Gabapentin due to ?"jerking"

## 2020-08-10 ENCOUNTER — Encounter: Payer: Self-pay | Admitting: Physical Medicine & Rehabilitation

## 2020-08-10 ENCOUNTER — Encounter: Payer: Medicare Other | Attending: Physical Medicine & Rehabilitation | Admitting: Physical Medicine & Rehabilitation

## 2020-08-10 ENCOUNTER — Other Ambulatory Visit: Payer: Self-pay

## 2020-08-10 ENCOUNTER — Encounter: Payer: Medicare Other | Admitting: Physical Medicine & Rehabilitation

## 2020-08-10 DIAGNOSIS — K5903 Drug induced constipation: Secondary | ICD-10-CM | POA: Insufficient documentation

## 2020-08-10 DIAGNOSIS — N319 Neuromuscular dysfunction of bladder, unspecified: Secondary | ICD-10-CM | POA: Insufficient documentation

## 2020-08-10 DIAGNOSIS — R609 Edema, unspecified: Secondary | ICD-10-CM | POA: Diagnosis not present

## 2020-08-10 DIAGNOSIS — G894 Chronic pain syndrome: Secondary | ICD-10-CM | POA: Diagnosis not present

## 2020-08-10 DIAGNOSIS — G479 Sleep disorder, unspecified: Secondary | ICD-10-CM | POA: Diagnosis not present

## 2020-08-10 DIAGNOSIS — M5416 Radiculopathy, lumbar region: Secondary | ICD-10-CM | POA: Insufficient documentation

## 2020-08-10 MED ORDER — NALOXEGOL OXALATE 25 MG PO TABS
25.0000 mg | ORAL_TABLET | Freq: Every day | ORAL | 0 refills | Status: DC
Start: 2020-08-10 — End: 2020-11-14

## 2020-08-10 NOTE — Progress Notes (Signed)
Subjective:    Patient ID: [name], female    DOB: 1929-08-17, 87 y.o.   MRN: [id number]  TELEHEALTH NOTE  Due to national recommendations of social distancing due to COVID 19, an audio/video telehealth visit is felt to be most appropriate for this patient at this time.  See Chart message from today for the patient's consent to telehealth from [place].     I verified that I am speaking with the correct person using two identifiers.  Location of patient: Home Location of provider: Office Method of communication: Telephone Names of participants : [name] scheduling, [employer] obtaining consent and vitals if available Established patient Time spent on call: 24 minutes  HPI Female with history of T2DM, neurogenic bladder, CKD, DDD with back and lower extremity pain with neurogenic claudication due to L2-L5 stenosis with spondylolisthesis presents for follow-up for re-do laminectomy with decompression of bilateral L3,L4,L5 nerve roots on 04/24/20.  Daughter supplements history.  Limited historian. Last clinic visit on 07/11/2020.  She continues to be in therapies.  She is continues to follow up with Neurosurg.  She continues to have bleeding around foley - she was seen by Urology, who is following up.  She still requires suppositories for the most the part. She has not been taking BID. She ran out of Movantik.  Denies falls. She continues to follow up with PCP for sleep meds, continues to struggle. She continues to have LE edema, she did not follow up with Cards.    Pain Inventory Average Pain 7 Pain Right Now 0 My pain is constant, burning and aching  In the last 24 hours, has pain interfered with the following? General activity 7 Relation with others 0 Enjoyment of life 10 What TIME of day is your pain at its worst? morning  and night Sleep (in general) Poor  Pain is worse with: standing Pain improves with: medication Relief from  Meds: 9  Family History  Problem Relation Age of Onset  . Pancreatic cancer Mother 27  . Diabetes Mother   . Prostate cancer Brother   . Diabetes Father   . Heart disease Father   . Diabetes Brother   . Heart attack Brother   . Diabetes Sister   . Diabetes Daughter   . Diabetes Son   . Coronary artery disease Brother        MI in his 51s  . Colon cancer Neg Hx   . Stomach cancer Neg Hx    Social History   Socioeconomic History  . Marital status: Married    Spouse name: Kimetha Trulson  . Number of children: 4  . Years of education: 28  . Highest education level: 12th grade  Occupational History  . Occupation: Retired  Tobacco Use  . Smoking status: Never Smoker  . Smokeless tobacco: Never Used  Vaping Use  . Vaping Use: Never used  Substance and Sexual Activity  . Alcohol use: No  . Drug use: No  . Sexual activity: Never  Other Topics Concern  . Not on file  Social History Narrative   HH 2 married   Works for [employer] for 40 years retired   No pets    Lives w spouse; married in 63; 62 years    Social Determinants of Health   Financial Resource Strain: Low Risk   . Difficulty of Paying Living Expenses: Not hard at all  Food Insecurity: No Food Insecurity  . Worried About Running  Out of Food in the Last Year: Never true  . Ran Out of Food in the Last Year: Never true  Transportation Needs: No Transportation Needs  . Lack of Transportation (Medical): No  . Lack of Transportation (Non-Medical): No  Physical Activity: Inactive  . Days of Exercise per Week: 0 days  . Minutes of Exercise per Session: 0 min  Stress: No Stress Concern Present  . Feeling of Stress : Only a little  Social Connections: Unknown  . Frequency of Communication with Friends and Family: More than three times a week  . Frequency of Social Gatherings with Friends and Family: More than three times a week  . Attends Religious Services: Not on file  . Active Member of Clubs or Organizations:  No  . Attends Archivist Meetings: Never  . Marital Status: Married   Past Surgical History:  Procedure Laterality Date  . BACK SURGERY    . BREAST LUMPECTOMY WITH RADIOACTIVE SEED LOCALIZATION Right 08/03/2015   Procedure: BREAST LUMPECTOMY WITH RADIOACTIVE SEED LOCALIZATION;  Surgeon: [name], MD;  Location: [facility];  Service: General;  Laterality: Right;  . BREAST SURGERY    . CARDIOVASCULAR STRESS TEST  01/14/2012   Low risk nuclear study w/ small mild apical and apical lateral reversible perfusion defect represents a small area of ischemia versus shifting breast artifact/  normal LV funciton and wall motion, ef 86%  . CARPAL TUNNEL RELEASE Right 1977  . CATARACT EXTRACTION W/ INTRAOCULAR LENS  IMPLANT, BILATERAL Bilateral left 1996/  right 1997  . CLOSED RIGHT KNEE MANIPULATION  08/11/2002   post TKA  . CYSTOSTOMY N/A 05/17/2016   Procedure: CYSTOSCOPY WITH  SUPRAPUBIC PLACMENT;  Surgeon: [name], MD;  Location: [facility];  Service: Urology;  Laterality: N/A;  . EYE SURGERY    . GANGLION CYST EXCISION Right 12/09/2001   right palm  . HERNIA REPAIR    . JOINT REPLACEMENT    . KNEE ARTHROSCOPY Right 12/14/2002   w/ Lysis Adhesions  . LAMINECTOMY  04/24/2020   Bilateral redo L2-3, L3-4 and L4-5 laminotomy/laminectomy/foraminotomies/medial facetectomy to decompress the bilateral L3, L4 and L5 nerve roots  . LUMBAR LAMINECTOMY/DECOMPRESSION MICRODISCECTOMY N/A 03/06/2016   Procedure: CENTRAL DECOMPRESSION L3 - L4 ,L4 - L5;  Surgeon: [name], MD;  Location: [facility];  Service: Orthopedics;  Laterality: N/A;  . SHOULDER HEMI-ARTHROPLASTY Right 02/20/2009   avascular necrosis   . TOTAL KNEE ARTHROPLASTY Bilateral right 06-23-2002/  left 07-11-2008  . [place]  . VAGINAL HYSTERECTOMY  1975   Past Medical History:  Diagnosis Date  . Anemia   . Arthritis   . Bilateral edema of lower extremity   .  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 Chronic lower back pain   . CKD (chronic kidney disease) stage 3, GFR 30-59 ml/min (HCC)   . DDD (degenerative disc disease)    lumbar  . Diverticulosis   . Family history of breast cancer   . Family history of pancreatic cancer   . Family history of prostate cancer   . Flaccid neuropathic bladder, not elsewhere classified   . Foley catheter in place   . GERD (gastroesophageal reflux disease)   . Hemorrhoids   . Hiatal hernia   .  History of colon polyps   . History of radiation therapy 09-12-2015 to 10-12-2015   42.72 gray in 16 fractions directed right breast w/ boost of 12 gray in 6 fractions directed at the lumpectomy cavity- Total dose: 54.72y  . History of recurrent UTIs   . Hyperlipidemia   . PONV (postoperative nausea and vomiting)   . RBBB (right bundle branch block with left anterior fascicular block)   . Type 2 diabetes mellitus ([place])   . Urinary retention with incomplete bladder emptying   . Wears dentures    full upper and lower partial  . Wears glasses   . Wears hearing aid    bilateral but does not wear   There were no vitals taken for this visit.  Opioid Risk Score:   Fall Risk Score:  `1  Depression screen PHQ 2/9  Depression screen [facility] 2/9 07/05/2020 06/28/2020 06/12/2020 08/28/2018 09/12/2016 06/28/2016 06/06/2016  Decreased Interest 2 3 3  0 0 0 0  Down, Depressed, Hopeless 2 3 3  0 0 0 0  PHQ - 2 Score 4 6 6  0 0 0 0  Altered sleeping 1 3 3  - - - -  Tired, decreased energy 1 3 3  - - - -  Change in appetite 3 3 1  - - - -  Feeling bad or failure about yourself  0 0 2 - - - -  Trouble concentrating 3 3 3  - - - -  Moving slowly or fidgety/restless 3 3 3  - - - -  Suicidal thoughts 0 0 2 - - - -  PHQ-9 Score 15 21  23  - - - -  Difficult doing work/chores Very difficult Very difficult Very difficult - - - -  Some recent data might be hidden    Review of Systems  Constitutional: Negative.   HENT: Negative.   Eyes: Negative.   Respiratory: Negative.   Cardiovascular: Negative.   Gastrointestinal: Negative.   Endocrine: Negative.   Genitourinary:       Foley  Musculoskeletal: Positive for arthralgias, back pain, gait problem and myalgias.  Skin: Negative.   Allergic/Immunologic: Negative.   Neurological: Positive for weakness and numbness.  Hematological: Negative.   Psychiatric/Behavioral: Positive for sleep disturbance.  All other systems reviewed and are negative.     Objective:   Physical Exam  Constitutional: No distress . Vital signs reviewed. Respiratory: Normal effort.  No stridor.   Psych: Normal mood.  Normal behavior. Neuro: Alert and oriented. HOH    Assessment & Plan:  Female with history of T2DM, neurogenic bladder, CKD, DDD with back and lower extremity pain with neurogenic claudication due to L2-L5 stenosis with spondylolisthesis presents for follow-up for re-do laminectomy with decompression of bilateral L3,L4,L5 nerve roots on 04/24/20.  1.  Functional and mobility deficits secondary to lumbar stenosis with neurogenic claudication, s/p re-do laminectomy with decompression of bilateral L3,L4,L5 nerve roots on 04/24/20.             Continue therapies, patient plans to follow up with PCP for additional therapies  Continue follow up with Neurosurg  2. Chronic Pain              Meds per Neurosurg  3.  Flaccid neurogenic bladder.    Chronic foley  Bleeding around tube, unchanged  Continue to follow up with Urology  4. Chronic drug induced constipation:              Continue Movantik 25   Continue Senna-s 2 tab BID  Miralax daily recommended  5. Gait abnormality  Continue walker for safety  6. Sleep disturbance  No benefit with Remeron  Being managed by  PCP  Wean Gabapentin due to ?"jerking", reminded again  7. Peripheral Edema  Follow up with Cards, reminded again  Cont diuretics per PCP  Patient states she had LE U/S rule out DVT, negative for DVT  Limit salt intake  Consider follow up for Lymphedema eval

## 2020-08-11 ENCOUNTER — Telehealth: Payer: Self-pay | Admitting: Internal Medicine

## 2020-08-11 DIAGNOSIS — M5136 Other intervertebral disc degeneration, lumbar region: Secondary | ICD-10-CM | POA: Diagnosis not present

## 2020-08-11 DIAGNOSIS — E1122 Type 2 diabetes mellitus with diabetic chronic kidney disease: Secondary | ICD-10-CM | POA: Diagnosis not present

## 2020-08-11 DIAGNOSIS — M48062 Spinal stenosis, lumbar region with neurogenic claudication: Secondary | ICD-10-CM | POA: Diagnosis not present

## 2020-08-11 DIAGNOSIS — D631 Anemia in chronic kidney disease: Secondary | ICD-10-CM | POA: Diagnosis not present

## 2020-08-11 DIAGNOSIS — N183 Chronic kidney disease, stage 3 unspecified: Secondary | ICD-10-CM | POA: Diagnosis not present

## 2020-08-11 DIAGNOSIS — M4316 Spondylolisthesis, lumbar region: Secondary | ICD-10-CM | POA: Diagnosis not present

## 2020-08-11 NOTE — Telephone Encounter (Signed)
[place] called to let Dr. Regis Bill know that she was at the patients house today and her body is jerking all over and she is not able to sleep.  She noticed that she is on furosemide (LASIX) 40 MG tablet but not potassium  She was wondering if you can do labs for CMP to check the patients potassium and magnesium on 08/14/2020 at her visit with Dr. Regis Bill because sometimes being low on these can cause the body to jerk.

## 2020-08-14 ENCOUNTER — Ambulatory Visit (INDEPENDENT_AMBULATORY_CARE_PROVIDER_SITE_OTHER): Payer: Medicare Other | Admitting: Internal Medicine

## 2020-08-14 ENCOUNTER — Other Ambulatory Visit: Payer: Self-pay

## 2020-08-14 ENCOUNTER — Ambulatory Visit (INDEPENDENT_AMBULATORY_CARE_PROVIDER_SITE_OTHER): Payer: Medicare Other

## 2020-08-14 ENCOUNTER — Encounter: Payer: Self-pay | Admitting: Internal Medicine

## 2020-08-14 VITALS — BP 118/54 | HR 96 | Temp 98.1°F | Ht 62.0 in | Wt 143.6 lb

## 2020-08-14 DIAGNOSIS — N319 Neuromuscular dysfunction of bladder, unspecified: Secondary | ICD-10-CM

## 2020-08-14 DIAGNOSIS — M25562 Pain in left knee: Secondary | ICD-10-CM | POA: Diagnosis not present

## 2020-08-14 DIAGNOSIS — M1612 Unilateral primary osteoarthritis, left hip: Secondary | ICD-10-CM | POA: Diagnosis not present

## 2020-08-14 DIAGNOSIS — R609 Edema, unspecified: Secondary | ICD-10-CM

## 2020-08-14 DIAGNOSIS — R253 Fasciculation: Secondary | ICD-10-CM | POA: Diagnosis not present

## 2020-08-14 DIAGNOSIS — D649 Anemia, unspecified: Secondary | ICD-10-CM | POA: Diagnosis not present

## 2020-08-14 DIAGNOSIS — Z9889 Other specified postprocedural states: Secondary | ICD-10-CM | POA: Diagnosis not present

## 2020-08-14 DIAGNOSIS — M25552 Pain in left hip: Secondary | ICD-10-CM

## 2020-08-14 DIAGNOSIS — Z981 Arthrodesis status: Secondary | ICD-10-CM | POA: Diagnosis not present

## 2020-08-14 DIAGNOSIS — Z79899 Other long term (current) drug therapy: Secondary | ICD-10-CM | POA: Diagnosis not present

## 2020-08-14 DIAGNOSIS — R600 Localized edema: Secondary | ICD-10-CM

## 2020-08-14 DIAGNOSIS — M79605 Pain in left leg: Secondary | ICD-10-CM | POA: Diagnosis not present

## 2020-08-14 DIAGNOSIS — N1832 Chronic kidney disease, stage 3b: Secondary | ICD-10-CM

## 2020-08-14 DIAGNOSIS — I878 Other specified disorders of veins: Secondary | ICD-10-CM | POA: Diagnosis not present

## 2020-08-14 DIAGNOSIS — R1032 Left lower quadrant pain: Secondary | ICD-10-CM | POA: Diagnosis not present

## 2020-08-14 DIAGNOSIS — E1169 Type 2 diabetes mellitus with other specified complication: Secondary | ICD-10-CM | POA: Diagnosis not present

## 2020-08-14 MED ORDER — TRAMADOL HCL 50 MG PO TABS: 25.0000 mg | ORAL_TABLET | Freq: Every evening | ORAL | 1 refills | Status: AC | PRN

## 2020-08-14 NOTE — Patient Instructions (Signed)
So  Checking lab and x ray to check for causes of pain and jerking  Suspect  Medicines  contributing  And agree that less  Is best.   We may need to get  Help from  neurology if  arent  able to  Resolve problem easily

## 2020-08-14 NOTE — Telephone Encounter (Signed)
Had OV today

## 2020-08-14 NOTE — Progress Notes (Signed)
Chief Complaint  Patient presents with  . Follow-up    HPI: [name] 87 y.o. come in forfollow-up medications and CDM , here with her daughter today.   Sleep says she cannot sleep but apparently falls asleep sitting in a chair dozing in the early evening with TV or other but then cannot sleep during the night has more pain in her left hip leg at night and is hard to sleep  Also relates what she calls shaking episodes for quite a while that happen in the evening and the only thing that helps her is to take a tramadol she states she only takes tramadol at night and Tylenol in the day for pain  She is frustrated that she cannot walk without fatigue or pain without a walker.  Tried Remeron and lorazepam which did not help.  Ambien has failed as well as melatonin when she just stopped.  She states her left leg pops and hurts when she lays down and when she walks but points to her hip and groin and her knee going down to her foot.  She cannot say that it is the same pain she had before the surgery.  She relates it perhaps to the fall that she had.  Apparently no  diagnosis  Attention to this with the physical therapists rehab .  Her plan is getting out  Of rehab.  Dr Posey Pronto.   But will need transfer of home health needs home health PT.  Wants to transfer to specialist.  As opposed to PCP.   Bathes self   sponge bath and dressing .   Husband does most food prep .  Walker  Dependant.  whol bpdyuy tired .  Only a few hours in bed   In recliner.  Goes to sleep reading.      Mobility not walking steadily by herself  Paying own bills and finances .   Husband does the cooking.  DM 120 ok readings and no lows  Edema about the same on Lasix is now taking 60 mg or 1-1/2 of the 40 mg cardiology soon.  The original back problem does not hurt as much see rehab visit note February 3  Please check right ear intermittent pain sometimes down into the neck but no associated fever or hearing change or  sore throat.    Had anemia after surgery has been taking iron up to about a week ago when she felt it was bothering her.  No active bleeding ROS: See pertinent positives and negatives per HPI.  No bleeding  Past Medical History:  Diagnosis Date  . Anemia   . Arthritis   . Bilateral edema of lower extremity   . 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 Chronic lower back pain   . CKD (chronic kidney disease) stage 3, GFR 30-59 ml/min (HCC)   . DDD (degenerative disc disease)    lumbar  . Diverticulosis   . Family history of breast cancer   . Family history of pancreatic cancer   . Family history of prostate cancer   . Flaccid neuropathic bladder, not elsewhere classified   . Foley catheter in place   . GERD (gastroesophageal reflux disease)   . Hemorrhoids   . Hiatal hernia   . History of  colon polyps   . History of radiation therapy 09-12-2015 to 10-12-2015   42.72 gray in 16 fractions directed right breast w/ boost of 12 gray in 6 fractions directed at the lumpectomy cavity- Total dose: 54.72y  . History of recurrent UTIs   . Hyperlipidemia   . PONV (postoperative nausea and vomiting)   . RBBB (right bundle branch block with left anterior fascicular block)   . Type 2 diabetes mellitus ([place])   . Urinary retention with incomplete bladder emptying   . Wears dentures    full upper and lower partial  . Wears glasses   . Wears hearing aid    bilateral but does not wear    Family History  Problem Relation Age of Onset  . Pancreatic cancer Mother 89  . Diabetes Mother   . Prostate cancer Brother   . Diabetes Father   . Heart disease Father   . Diabetes Brother   . Heart attack Brother   . Diabetes Sister   . Diabetes Daughter   . Diabetes Son   . Coronary artery  disease Brother        MI in his 61s  . Colon cancer Neg Hx   . Stomach cancer Neg Hx     Social History   Socioeconomic History  . Marital status: Married    Spouse name: Collyn Selk  . Number of children: 4  . Years of education: 69  . Highest education level: 12th grade  Occupational History  . Occupation: Retired  Tobacco Use  . Smoking status: Never Smoker  . Smokeless tobacco: Never Used  Vaping Use  . Vaping Use: Never used  Substance and Sexual Activity  . Alcohol use: No  . Drug use: No  . Sexual activity: Never  Other Topics Concern  . Not on file  Social History Narrative   HH 2 married   Works for [employer] for 40 years retired   No pets    Lives w spouse; married in 45; 41 years    Social Determinants of Health   Financial Resource Strain: Low Risk   . Difficulty of Paying Living Expenses: Not hard at all  Food Insecurity: No Food Insecurity  . Worried About Charity fundraiser in the Last Year: Never true  . Ran Out of Food in the Last Year: Never true  Transportation Needs: No Transportation Needs  . Lack of Transportation (Medical): No  . Lack of Transportation (Non-Medical): No  Physical Activity: Inactive  . Days of Exercise per Week: 0 days  . Minutes of Exercise per Session: 0 min  Stress: No Stress Concern Present  . Feeling of Stress : Only a little  Social Connections: Unknown  . Frequency of Communication with Friends and Family: More than three times a week  . Frequency of Social Gatherings with Friends and Family: More than three times a week  . Attends Religious Services: Not on file  . Active Member of Clubs or Organizations: No  . Attends Archivist Meetings: Never  . Marital Status: Married    Outpatient Medications Prior to Visit  Medication Sig Dispense Refill  . acetaminophen (TYLENOL) 500 MG tablet Take 500 mg by mouth every 6 (six) hours as needed for mild pain or moderate pain.     Marland Kitchen aspirin EC 81 MG tablet  Take 81 mg by mouth daily at 12 noon.     Marland Kitchen atorvastatin (LIPITOR) 10 MG tablet Take 1 tablet (10 mg total)  by mouth daily. 90 tablet 1  . Cholecalciferol (VITAMIN D) 50 MCG (2000 UT) tablet Take 2,000 Units by mouth daily.    . cyanocobalamin 1000 MCG tablet Take 1,000 mcg by mouth at bedtime.     . docusate sodium (COLACE) 100 MG capsule Take 1 capsule (100 mg total) by mouth 2 (two) times daily. 60 capsule 0  . esomeprazole (NEXIUM) 40 MG capsule Take 1 capsule (40 mg total) by mouth daily. 90 capsule 1  . furosemide (LASIX) 40 MG tablet Take 1 tablet (40 mg total) by mouth daily. Or as directed 90 tablet 1  . glipiZIDE (GLUCOTROL) 10 MG tablet Take 1 tablet (10 mg total) by mouth in the morning. 90 tablet 3  . LORazepam (ATIVAN) 0.5 MG tablet Take 0.5-1 tablets (0.25-0.5 mg total) by mouth at bedtime as needed for anxiety or sleep (do not take with narcotics or ambien). 14 tablet 1  . naloxegol oxalate (MOVANTIK) 25 MG TABS tablet Take 1 tablet (25 mg total) by mouth daily. 30 tablet 0  . polyethylene glycol (MIRALAX / GLYCOLAX) 17 g packet Take 17 g by mouth daily as needed for moderate constipation or severe constipation. 14 each 0  . senna-docusate (SENOKOT-S) 8.6-50 MG tablet Take 2 tablets by mouth 2 (two) times daily. 120 tablet 2  . melatonin 3 MG TABS tablet Take 1 tablet (3 mg total) by mouth at bedtime as needed (insomnia). (Patient taking differently: Take 10 mg by mouth at bedtime as needed (insomnia).) 30 tablet 0  . mirtazapine (REMERON) 15 MG tablet Take 7.5 mg by mouth daily.    . cyclobenzaprine (FLEXERIL) 10 MG tablet Take 0.5 tablets (5 mg total) by mouth at bedtime as needed for muscle spasms. (Patient not taking: Reported on 08/14/2020) 30 tablet 0  . gabapentin (NEURONTIN) 600 MG tablet Take 0.5 tablets (300 mg total) by mouth at bedtime. (Patient not taking: Reported on 08/14/2020) 15 tablet 0  . oxyCODONE-acetaminophen (PERCOCET/ROXICET) 5-325 MG tablet Take 1 tablet by mouth  every 6 (six) hours as needed for severe pain. (Patient not taking: Reported on 08/14/2020) 28 tablet 0  . traMADol (ULTRAM) 50 MG tablet Take 1 tablet (50 mg total) by mouth every 6 (six) hours as needed. (Patient not taking: Reported on 08/14/2020) 28 tablet 0  . zolpidem (AMBIEN) 5 MG tablet Take 0.5 tablets (2.5 mg total) by mouth at bedtime as needed for sleep. (Patient not taking: Reported on 08/14/2020) 30 tablet 0   No facility-administered medications prior to visit.     EXAM:  BP (!) 118/54 (BP Location: Right Arm, Patient Position: Sitting, Cuff Size: Large)   Pulse 96   Temp 98.1 F (36.7 C) (Oral)   Ht 5' 2"  (1.575 m)   Wt 143 lb 9.6 oz (65.1 kg)   SpO2 99%   BMI 26.26 kg/m   Body mass index is 26.26 kg/m.  GENERAL: vitals reviewed and listed above, alert, oriented, appears well hydrated and in no acute distress in wheelchair but can stand independently appears cognitively intact nontoxic HEENT: atraumatic, conjunctiva  clear, no obvious abnormalities on inspection of external nose and ears wears glasses TMs are clear on both sides EACs patent OP : masked   NECK: no obvious masses on inspection palpation  LUNGS: clear to auscultation bilaterally, no wheezes, rales or rhonchi, good air movement CV: HRRR, no clubbing cyanosis or +1 to +2 edema both legs left slightly greater than right no ulcers some varicosity MS: moves all extremities difficult  to examine him able to stand and bear weight but did not test her balance today. PSYCH: pleasant and cooperative, no obvious depression or anxiety Lab Results  Component Value Date   WBC 5.9 05/31/2020   HGB 10.9 (L) 05/31/2020   HCT 34.1 (L) 05/31/2020   PLT 393 05/31/2020   GLUCOSE 138 (H) 06/14/2020   CHOL 180 08/31/2019   TRIG 323.0 (H) 08/31/2019   HDL 35.80 (L) 08/31/2019   LDLDIRECT 81.0 08/31/2019   LDLCALC 83 12/16/2013   ALT 12 05/03/2020   AST 19 05/03/2020   NA 137 06/14/2020   K 4.4 06/14/2020   CL 98  06/14/2020   CREATININE 1.25 (H) 06/14/2020   BUN 26 (H) 06/14/2020   CO2 30 06/14/2020   TSH 3.64 04/12/2020   INR 1.0 12/25/2019   HGBA1C 6.6 (H) 03/09/2020   MICROALBUR 10.3 (H) 08/31/2019   BP Readings from Last 3 Encounters:  08/14/20 (!) 118/54  07/11/20 126/66  06/12/20 130/79    ASSESSMENT AND PLAN:  Discussed the following assessment and plan:  Jerking - Plan: Basic metabolic panel, CBC with Differential/Platelet, Hepatic function panel, Hemoglobin A1c, TSH, Magnesium, Magnesium, TSH, Hemoglobin A1c, Hepatic function panel, CBC with Differential/Platelet, Basic metabolic panel  Type 2 diabetes mellitus with other specified complication, without long-term current use of insulin (HCC) - Plan: Basic metabolic panel, CBC with Differential/Platelet, Hepatic function panel, Hemoglobin A1c, TSH, Magnesium, Magnesium, TSH, Hemoglobin A1c, Hepatic function panel, CBC with Differential/Platelet, Basic metabolic panel  Peripheral edema - Plan: Basic metabolic panel, CBC with Differential/Platelet, Hepatic function panel, Hemoglobin A1c, TSH, Magnesium, Magnesium, TSH, Hemoglobin A1c, Hepatic function panel, CBC with Differential/Platelet, Basic metabolic panel  Left leg pain - Plan: DG Hip Unilat W OR W/O Pelvis 2-3 Views Left, DG Knee 3 Views Left  Left hip pain - Plan: DG Hip Unilat W OR W/O Pelvis 2-3 Views Left, DG Knee 3 Views Left  Anemia, unspecified type - Plan: Basic metabolic panel, CBC with Differential/Platelet, Hepatic function panel, Hemoglobin A1c, TSH, Magnesium, Magnesium, TSH, Hemoglobin A1c, Hepatic function panel, CBC with Differential/Platelet, Basic metabolic panel  Medication management  History of back surgery - Plan: DG Hip Unilat W OR W/O Pelvis 2-3 Views Left, DG Knee 3 Views Left  Neurogenic bladder  Stage 3b chronic kidney disease (HCC) Complicated medication history with multiple medications that "do not work for sleep" Agree with deprescribing many  of these medicines since they are not helping anyway .  Certainly could be contributing to her system symptoms.   She did have the gabapentin stop fairly suddenly from 300-0 and had been on a low-dose of this for years or a "long time." The only narcotic she has been taking for weeks is the tramadol at night to help her shaking but daughter and she feel they may have gone through withdrawal after getting out of the hospital.  She may have been off the tramadol altogether for 1 week uncertain how long she was on it before then.  But only taking it once in the evening  Tramadol No. 15 with 1 refill given patient requested 30 as this seems to be the only thing that stops her shaking at night.  Sounds like myoclonus considered drug withdrawal based on all the meds she has been on and stopped I want her to keep a medication calendar diary of timing and timing of symptoms.  In place the medicine she is not on a way from her regular medicines. She feels like she  is in good control and awareness  of when she takes her medicines but this method can help [place] determine what may or may not be causing a side effect.  X-ray of her hip and knee because of continued groin and leg pain that she relates to the fall after her surgery but has had left leg pain before the surgery perhaps different.  In regard to her bladder: ever since her surgery she has had no normal voiding which was a change for her.  Has been using medicines and MiraLAX for constipation so that is not triggering it.  So would assess this as a neurogenic bladder getting worse.  Possibly from previous medicines uncertain.  No explanation for history of ear pain although could be radiating pain no significant abnormality today.  -Patient advised to return or notify health care team  if  new concerns arise. In interim 60 min review visit counsel and planning   Patient Instructions  So  Checking lab and x ray to check for causes of pain and jerking   Suspect  Medicines  contributing  And agree that less  Is best.   We may need to get  Help from  neurology if  arent  able to  Resolve problem easily     Mariann Laster K. Lyna Laningham M.D.

## 2020-08-15 LAB — HEPATIC FUNCTION PANEL
ALT: 8 U/L (ref 0–35)
AST: 12 U/L (ref 0–37)
Albumin: 4.2 g/dL (ref 3.5–5.2)
Alkaline Phosphatase: 107 U/L (ref 39–117)
Bilirubin, Direct: 0.1 mg/dL (ref 0.0–0.3)
Total Bilirubin: 0.3 mg/dL (ref 0.2–1.2)
Total Protein: 7.2 g/dL (ref 6.0–8.3)

## 2020-08-15 LAB — CBC WITH DIFFERENTIAL/PLATELET
Basophils Absolute: 0.1 10*3/uL (ref 0.0–0.1)
Basophils Relative: 1.5 % (ref 0.0–3.0)
Eosinophils Absolute: 0.3 10*3/uL (ref 0.0–0.7)
Eosinophils Relative: 5.9 % — ABNORMAL HIGH (ref 0.0–5.0)
HCT: 35.7 % — ABNORMAL LOW (ref 36.0–46.0)
Hemoglobin: 11.7 g/dL — ABNORMAL LOW (ref 12.0–15.0)
Lymphocytes Relative: 37.6 % (ref 12.0–46.0)
Lymphs Abs: 1.9 10*3/uL (ref 0.7–4.0)
MCHC: 32.7 g/dL (ref 30.0–36.0)
MCV: 87.4 fl (ref 78.0–100.0)
Monocytes Absolute: 0.6 10*3/uL (ref 0.1–1.0)
Monocytes Relative: 11.1 % (ref 3.0–12.0)
Neutro Abs: 2.3 10*3/uL (ref 1.4–7.7)
Neutrophils Relative %: 43.9 % (ref 43.0–77.0)
Platelets: 274 10*3/uL (ref 150.0–400.0)
RBC: 4.08 Mil/uL (ref 3.87–5.11)
RDW: 15.2 % (ref 11.5–15.5)
WBC: 5.1 10*3/uL (ref 4.0–10.5)

## 2020-08-15 LAB — BASIC METABOLIC PANEL
BUN: 26 mg/dL — ABNORMAL HIGH (ref 6–23)
CO2: 36 mEq/L — ABNORMAL HIGH (ref 19–32)
Calcium: 9.9 mg/dL (ref 8.4–10.5)
Chloride: 98 mEq/L (ref 96–112)
Creatinine, Ser: 1.47 mg/dL — ABNORMAL HIGH (ref 0.40–1.20)
GFR: 31.69 mL/min — ABNORMAL LOW (ref >60.00)
Glucose, Bld: 94 mg/dL (ref 70–99)
Potassium: 4.4 mEq/L (ref 3.5–5.1)
Sodium: 140 mEq/L (ref 135–145)

## 2020-08-15 LAB — HEMOGLOBIN A1C: Hgb A1c MFr Bld: 6.9 % — ABNORMAL HIGH (ref 4.6–6.5)

## 2020-08-15 LAB — MAGNESIUM: Magnesium: 2 mg/dL (ref 1.5–2.5)

## 2020-08-15 LAB — TSH: TSH: 4.48 u[IU]/mL (ref 0.35–4.50)

## 2020-08-16 DIAGNOSIS — M48062 Spinal stenosis, lumbar region with neurogenic claudication: Secondary | ICD-10-CM | POA: Diagnosis not present

## 2020-08-16 DIAGNOSIS — N183 Chronic kidney disease, stage 3 unspecified: Secondary | ICD-10-CM | POA: Diagnosis not present

## 2020-08-16 DIAGNOSIS — M4316 Spondylolisthesis, lumbar region: Secondary | ICD-10-CM | POA: Diagnosis not present

## 2020-08-16 DIAGNOSIS — D631 Anemia in chronic kidney disease: Secondary | ICD-10-CM | POA: Diagnosis not present

## 2020-08-16 DIAGNOSIS — E1122 Type 2 diabetes mellitus with diabetic chronic kidney disease: Secondary | ICD-10-CM | POA: Diagnosis not present

## 2020-08-16 DIAGNOSIS — M5136 Other intervertebral disc degeneration, lumbar region: Secondary | ICD-10-CM | POA: Diagnosis not present

## 2020-08-16 NOTE — Addendum Note (Signed)
Addended byShanon Ace K on: 08/16/2020 01:59 PM   Modules accepted: Orders

## 2020-08-16 NOTE — Progress Notes (Signed)
So x-ray show severe arthritis in your left hip knee is stable.  Suspecting that your left leg pain may also be related to hip arthritis.  Blood work is stable anemia slightly better blood sugars controlled.  No further explanation for the jerking at night   I am planning referral to neurology to get help with this problem.I am placing referral for this evaluation In addition I would like you to see orthopedist about your hip which I think is  causing leg pain. Please arrange for orthopedic evaluation with practice of her choice .

## 2020-08-17 ENCOUNTER — Telehealth: Payer: Self-pay | Admitting: Adult Health

## 2020-08-17 DIAGNOSIS — Z923 Personal history of irradiation: Secondary | ICD-10-CM | POA: Diagnosis not present

## 2020-08-17 DIAGNOSIS — Z7984 Long term (current) use of oral hypoglycemic drugs: Secondary | ICD-10-CM | POA: Diagnosis not present

## 2020-08-17 DIAGNOSIS — Z96611 Presence of right artificial shoulder joint: Secondary | ICD-10-CM | POA: Diagnosis not present

## 2020-08-17 DIAGNOSIS — K219 Gastro-esophageal reflux disease without esophagitis: Secondary | ICD-10-CM | POA: Diagnosis not present

## 2020-08-17 DIAGNOSIS — Z7982 Long term (current) use of aspirin: Secondary | ICD-10-CM | POA: Diagnosis not present

## 2020-08-17 DIAGNOSIS — I4510 Unspecified right bundle-branch block: Secondary | ICD-10-CM | POA: Diagnosis not present

## 2020-08-17 DIAGNOSIS — Z171 Estrogen receptor negative status [ER-]: Secondary | ICD-10-CM | POA: Diagnosis not present

## 2020-08-17 DIAGNOSIS — D631 Anemia in chronic kidney disease: Secondary | ICD-10-CM | POA: Diagnosis not present

## 2020-08-17 DIAGNOSIS — M5136 Other intervertebral disc degeneration, lumbar region: Secondary | ICD-10-CM | POA: Diagnosis not present

## 2020-08-17 DIAGNOSIS — N319 Neuromuscular dysfunction of bladder, unspecified: Secondary | ICD-10-CM | POA: Diagnosis not present

## 2020-08-17 DIAGNOSIS — K5790 Diverticulosis of intestine, part unspecified, without perforation or abscess without bleeding: Secondary | ICD-10-CM | POA: Diagnosis not present

## 2020-08-17 DIAGNOSIS — E785 Hyperlipidemia, unspecified: Secondary | ICD-10-CM | POA: Diagnosis not present

## 2020-08-17 DIAGNOSIS — Z90710 Acquired absence of both cervix and uterus: Secondary | ICD-10-CM | POA: Diagnosis not present

## 2020-08-17 DIAGNOSIS — M48062 Spinal stenosis, lumbar region with neurogenic claudication: Secondary | ICD-10-CM | POA: Diagnosis not present

## 2020-08-17 DIAGNOSIS — Z466 Encounter for fitting and adjustment of urinary device: Secondary | ICD-10-CM | POA: Diagnosis not present

## 2020-08-17 DIAGNOSIS — Z87440 Personal history of urinary (tract) infections: Secondary | ICD-10-CM | POA: Diagnosis not present

## 2020-08-17 DIAGNOSIS — Z961 Presence of intraocular lens: Secondary | ICD-10-CM | POA: Diagnosis not present

## 2020-08-17 DIAGNOSIS — E1122 Type 2 diabetes mellitus with diabetic chronic kidney disease: Secondary | ICD-10-CM | POA: Diagnosis not present

## 2020-08-17 DIAGNOSIS — Z96653 Presence of artificial knee joint, bilateral: Secondary | ICD-10-CM | POA: Diagnosis not present

## 2020-08-17 DIAGNOSIS — M1990 Unspecified osteoarthritis, unspecified site: Secondary | ICD-10-CM | POA: Diagnosis not present

## 2020-08-17 DIAGNOSIS — Z9841 Cataract extraction status, right eye: Secondary | ICD-10-CM | POA: Diagnosis not present

## 2020-08-17 DIAGNOSIS — M4316 Spondylolisthesis, lumbar region: Secondary | ICD-10-CM | POA: Diagnosis not present

## 2020-08-17 DIAGNOSIS — Z853 Personal history of malignant neoplasm of breast: Secondary | ICD-10-CM | POA: Diagnosis not present

## 2020-08-17 DIAGNOSIS — Z9842 Cataract extraction status, left eye: Secondary | ICD-10-CM | POA: Diagnosis not present

## 2020-08-17 DIAGNOSIS — N183 Chronic kidney disease, stage 3 unspecified: Secondary | ICD-10-CM | POA: Diagnosis not present

## 2020-08-17 NOTE — Telephone Encounter (Signed)
Rescheduled appts per [facility] schedule change. Left voicemail with appt cancellation and new appt date/time.

## 2020-08-20 NOTE — Progress Notes (Signed)
Cardiology Office Note:    Date:  08/21/2020   ID:  [name], DOB 18-Oct-1929, MRN [id number]  PCP:  Burnis Medin, MD   [place]  Cardiologist:  Freada Bergeron, MD  Advanced Practice Provider:  No care team member to display Electrophysiologist:  None    Referring MD: Burnis Medin, MD    History of Present Illness:    [name] is a 87 y.o. female with a hx of DMII, HLD, and CKD with chronic indwelling foley who returns to clinic for follow-up.  During last visit in 03/2020, the patient was seen as preoperative visit prior to laminectomy. Myoview was normal with no evidence of ischemia. TTE showed normal BiV function, no significant valve disease and LE dopplers obtained for LE edema was normal.  Since our last visit visit, she underwent laminectomy. She says recovery has been difficult and she continues to work with PT. She also continues to have LE edema and is currently on lasix 93m daily. She has not noticed no significant improvement in her LLE swelling. Repeat doppler ultrasound negative for DVT. She is frustrated as her symptoms have not gotten any better. No chest pain, SOB, orthopnea. No LE wounds. Feet are warm. No ABIs in our system.  Past Medical History:  Diagnosis Date  . Anemia   . Arthritis   . Bilateral edema of lower extremity   . 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 Chronic lower back pain   . CKD (chronic kidney disease) stage 3, GFR 30-59 ml/min (HCC)   . DDD (degenerative disc disease)    lumbar  . Diverticulosis   . Family history of breast cancer   . Family history of pancreatic cancer   . Family history of prostate cancer   . Flaccid neuropathic bladder, not elsewhere classified   . Foley  catheter in place   . GERD (gastroesophageal reflux disease)   . Hemorrhoids   . Hiatal hernia   . History of colon polyps   . History of radiation therapy 09-12-2015 to 10-12-2015   42.72 gray in 16 fractions directed right breast w/ boost of 12 gray in 6 fractions directed at the lumpectomy cavity- Total dose: 54.72y  . History of recurrent UTIs   . Hyperlipidemia   . PONV (postoperative nausea and vomiting)   . RBBB (right bundle branch block with left anterior fascicular block)   . Type 2 diabetes mellitus ([facility]   . Urinary retention with incomplete bladder emptying   . Wears dentures    full upper and lower partial  . Wears glasses   . Wears hearing aid    bilateral but does not wear    Past Surgical History:  Procedure Laterality Date  . BACK SURGERY    . BREAST LUMPECTOMY WITH RADIOACTIVE SEED LOCALIZATION Right 08/03/2015   Procedure: BREAST LUMPECTOMY WITH RADIOACTIVE SEED LOCALIZATION;  Surgeon: MRolm Bookbinder MD;  [facility]  Service: General;  Laterality: Right;  . BREAST SURGERY    . CARDIOVASCULAR STRESS TEST  01/14/2012   Low risk nuclear study w/ small mild apical and apical lateral reversible perfusion defect represents a small area of ischemia versus shifting breast artifact/  normal LV funciton and wall motion, ef 86%  .  CARPAL TUNNEL RELEASE Right 1977  . CATARACT EXTRACTION W/ INTRAOCULAR LENS  IMPLANT, BILATERAL Bilateral left 1996/  right 1997  . CLOSED RIGHT KNEE MANIPULATION  08/11/2002   post TKA  . CYSTOSTOMY N/A 05/17/2016   Procedure: CYSTOSCOPY WITH  SUPRAPUBIC PLACMENT;  Surgeon: [name], MD;  Location: [facility];  Service: Urology;  Laterality: N/A;  . EYE SURGERY    . GANGLION CYST EXCISION Right 12/09/2001   right palm  . HERNIA REPAIR    . JOINT REPLACEMENT    . KNEE ARTHROSCOPY Right 12/14/2002   w/ Lysis Adhesions  . LAMINECTOMY  04/24/2020   Bilateral redo L2-3, L3-4 and L4-5  laminotomy/laminectomy/foraminotomies/medial facetectomy to decompress the bilateral L3, L4 and L5 nerve roots  . LUMBAR LAMINECTOMY/DECOMPRESSION MICRODISCECTOMY N/A 03/06/2016   Procedure: CENTRAL DECOMPRESSION L3 - L4 ,L4 - L5;  Surgeon: [name], MD;  Location: [facility];  Service: Orthopedics;  Laterality: N/A;  . SHOULDER HEMI-ARTHROPLASTY Right 02/20/2009   avascular necrosis   . TOTAL KNEE ARTHROPLASTY Bilateral right 06-23-2002/  left 07-11-2008  . [place]  . VAGINAL HYSTERECTOMY  1975    Current Medications: Current Meds  Medication Sig  . acetaminophen (TYLENOL) 500 MG tablet Take 500 mg by mouth every 6 (six) hours as needed for mild pain or moderate pain.   Marland Kitchen aspirin EC 81 MG tablet Take 81 mg by mouth daily at 12 noon.   Marland Kitchen atorvastatin (LIPITOR) 10 MG tablet Take 1 tablet (10 mg total) by mouth daily.  . Cholecalciferol (VITAMIN D) 50 MCG (2000 UT) tablet Take 2,000 Units by mouth daily.  . cyanocobalamin 1000 MCG tablet Take 1,000 mcg by mouth at bedtime.   . docusate sodium (COLACE) 100 MG capsule Take 1 capsule (100 mg total) by mouth 2 (two) times daily.  Marland Kitchen esomeprazole (NEXIUM) 40 MG capsule Take 1 capsule (40 mg total) by mouth daily.  . furosemide (LASIX) 40 MG tablet Take 1 tablet (40 mg total) by mouth daily. Or as directed (Patient taking differently: Take 60 mg by mouth daily. 1 1/2 tab daily)  . glipiZIDE (GLUCOTROL) 10 MG tablet Take 1 tablet (10 mg total) by mouth in the morning.  . naloxegol oxalate (MOVANTIK) 25 MG TABS tablet Take 1 tablet (25 mg total) by mouth daily.  . polyethylene glycol (MIRALAX / GLYCOLAX) 17 g packet Take 17 g by mouth daily as needed for moderate constipation or severe constipation.  . senna-docusate (SENOKOT-S) 8.6-50 MG tablet Take 2 tablets by mouth 2 (two) times daily.  . traMADol (ULTRAM) 50 MG tablet Take by mouth daily.  . Turmeric (QC TUMERIC COMPLEX) 500 MG CAPS Take by mouth daily.     Allergies:    Phenergan [promethazine] and Remeron [mirtazapine]   Social History   Socioeconomic History  . Marital status: Married    Spouse name: Chariah Bailey  . Number of children: 4  . Years of education: 62  . Highest education level: 12th grade  Occupational History  . Occupation: Retired  Tobacco Use  . Smoking status: Never Smoker  . Smokeless tobacco: Never Used  Vaping Use  . Vaping Use: Never used  Substance and Sexual Activity  . Alcohol use: No  . Drug use: No  . Sexual activity: Never  Other Topics Concern  . Not on file  Social History Narrative   HH 2 married   Works for [employer] for 40 years retired   No pets  Lives w spouse; married in 22; 37 years    Social Determinants of Health   Financial Resource Strain: Low Risk   . Difficulty of Paying Living Expenses: Not hard at all  Food Insecurity: No Food Insecurity  . Worried About Charity fundraiser in the Last Year: Never true  . Ran Out of Food in the Last Year: Never true  Transportation Needs: No Transportation Needs  . Lack of Transportation (Medical): No  . Lack of Transportation (Non-Medical): No  Physical Activity: Inactive  . Days of Exercise per Week: 0 days  . Minutes of Exercise per Session: 0 min  Stress: No Stress Concern Present  . Feeling of Stress : Only a little  Social Connections: Unknown  . Frequency of Communication with Friends and Family: More than three times a week  . Frequency of Social Gatherings with Friends and Family: More than three times a week  . Attends Religious Services: Not on file  . Active Member of Clubs or Organizations: No  . Attends Archivist Meetings: Never  . Marital Status: Married     Family History: The patient's family history includes Coronary artery disease in her brother; Diabetes in her brother, daughter, father, mother, sister, and son; Heart attack in her brother; Heart disease in her father; Pancreatic cancer (age of onset: 41) in her  mother; Prostate cancer in her brother. There is no history of Colon cancer or Stomach cancer.  ROS:   Please see the history of present illness.    Review of Systems  Constitutional: Positive for malaise/fatigue. Negative for chills and fever.  HENT: Negative for nosebleeds.   Eyes: Negative for blurred vision and redness.  Respiratory: Negative for shortness of breath.   Cardiovascular: Positive for leg swelling. Negative for chest pain, palpitations, orthopnea, claudication and PND.  Gastrointestinal: Negative for melena, nausea and vomiting.  Genitourinary: Negative for dysuria and hematuria.  Musculoskeletal: Positive for back pain, joint pain and myalgias. Negative for falls.  Neurological: Negative for dizziness and loss of consciousness.  Psychiatric/Behavioral: Negative for substance abuse.    EKGs/Labs/Other Studies Reviewed:    The following studies were reviewed today: Myoview 04/2020:  The left ventricular ejection fraction is hyperdynamic (>65%).  Nuclear stress EF: 79%.  There was no ST segment deviation noted during stress.  There is a subtle defect in the mid to apical inferolateral region that appears to be influenced by bowel loop attenuation artifact. No significant ischemia identified.  This is a low risk study.   TTE 04/13/20: IMPRESSIONS    1. Left ventricular ejection fraction, by estimation, is 60 to 65%. The  left ventricle has normal function. The left ventricle has no regional  wall motion abnormalities. There is mild concentric left ventricular  hypertrophy of the basal-septal segment.  Left ventricular diastolic parameters are consistent with Grade I  diastolic dysfunction (impaired relaxation).  2. Right ventricular systolic function is normal. The right ventricular  size is normal. There is normal pulmonary artery systolic pressure.  3. Average HR 91 bpm.. The mitral valve is abnormal. No evidence of  mitral valve regurgitation. Moderate  mitral annular calcification.  4. Left atrial size was mildly dilated.  5. The aortic valve is tricuspid. There is moderate calcification of the  aortic valve. Aortic valve regurgitation is not visualized. Mild aortic  valve sclerosis is present, with no evidence of aortic valve stenosis.  6. Aortic Normal Ascending Ao.  7. The inferior vena cava is normal in size  with greater than 50%  respiratory variability, suggesting right atrial pressure of 3 mmHg.   Comparison(s): No prior Echocardiogram.   LE dopplers 04/2020: Summary:  RIGHT:  - There is no evidence of deep vein thrombosis in the lower extremity.    - No cystic structure found in the popliteal fossa.    LEFT:  - There is no evidence of deep vein thrombosis in the lower extremity.    - No cystic structure found in the popliteal fossa.   LE dopplers 07/11/20: Summary:  RIGHT:  - No evidence of common femoral vein obstruction.    LEFT:  - There is no evidence of deep vein thrombosis in the lower extremity.    - No cystic structure found in the popliteal fossa.   EKG:  EKG is  ordered today.  The ekg ordered today demonstrates NSR with RBBB, HR 100  Recent Labs: 03/30/2020: NT-Pro BNP 491 08/14/2020: ALT 8; BUN 26; Creatinine, Ser 1.47; Hemoglobin 11.7; Magnesium 2.0; Platelets 274.0; Potassium 4.4; Sodium 140; TSH 4.48  Recent Lipid Panel    Component Value Date/Time   CHOL 180 08/31/2019 1052   TRIG 323.0 (H) 08/31/2019 1052   HDL 35.80 (L) 08/31/2019 1052   CHOLHDL 5 08/31/2019 1052   VLDL 64.6 (H) 08/31/2019 1052   LDLCALC 83 12/16/2013 1112   LDLDIRECT 81.0 08/31/2019 1052     Physical Exam:    VS:  BP 100/60   Pulse 100   Ht 5' 0.5" (1.537 m)   Wt 141 lb (64 kg)   SpO2 96%   BMI 27.08 kg/m     Wt Readings from Last 3 Encounters:  08/21/20 141 lb (64 kg)  08/14/20 143 lb 9.6 oz (65.1 kg)  07/11/20 143 lb 3.2 oz (65 kg)     GEN:  Well nourished, well developed in no acute  distress HEENT: Normal NECK: No JVD; No carotid bruits CARDIAC: RRR, no murmurs, rubs, gallops RESPIRATORY:  Clear to auscultation without rales, wheezing or rhonchi  ABDOMEN: Soft, non-tender, non-distended MUSCULOSKELETAL:  1+ pitting edema to the knee on the left; trace pitting edema on the right. Feet are warm with no wounds. Multiple spider veins present. SKIN: Warm and dry NEUROLOGIC:  Alert and oriented x 3 PSYCHIATRIC:  Normal affect   ASSESSMENT:    1. Bilateral leg pain   2. Lower extremity edema   3. Pure hypercholesterolemia   4. Type 2 diabetes mellitus without complication, without long-term current use of insulin (HCC)   5. Stage 3b chronic kidney disease (HCC)    PLAN:    In order of problems listed above:  #LE Edema: TTE with normal BiV function. LE dopplers negative for DVT on multiple checks. Has persisted despite recent increase in lasix 64m daily. Feet are warm with no wounds. Unable to palpate DP pulse however. Will check bilateral ABIs and refer to vein clinic. -Venous dopplers negative -Check ABIs -Refer to vein clinic -Continue lasix for symptomatic management -Compression stockings -Low Na diet  #HTN: Well controlled off antihypertensives. -Continue to monitor  #HLD: -Continue atorvastatin 113mdaily  #DMII: -Continue glipizide  #Degenerative disc disease s/p laminectomy: -Continue PT -Follow-up with surgery team as scheduled  #Episodic jerking: -Referred to Neurology per PCP  #Neurogenic Bladder: #CKD IIIB: Chronic indwelling foley -Management per PCP and Urology     Medication Adjustments/Labs and Tests Ordered: Current medicines are reviewed at length with the patient today.  Concerns regarding medicines are outlined above.  Orders Placed This Encounter  Procedures  . Ambulatory referral to Vascular Surgery  . EKG 12-Lead  . VAS [place] LOWER EXTREMITY ARTERIAL DUPLEX  . VAS [place] ABI WITH/WO TBI   No orders of the defined types  were placed in this encounter.   Patient Instructions  Medication Instructions:  Your physician recommends that you continue on your current medications as directed. Please refer to the Current Medication list given to you today.  *If you need a refill on your cardiac medications before your next appointment, please call your pharmacy*   Lab Work: None If you have labs (blood work) drawn today and your tests are completely normal, you will receive your results only by: Marland Kitchen MyChart Message (if you have MyChart) OR . A paper copy in the mail If you have any lab test that is abnormal or we need to change your treatment, we will call you to review the results.   Testing/Procedures: Your physician has requested that you have a lower extremity arterial duplex and ABIs. This test is an ultrasound of the arteries in the legs. It looks at arterial blood flow in the legs. Allow one hour for Lower Arterial scans. There are no restrictions or special instructions  Your physician has requested that you have an ankle brachial index (ABI). During this test an ultrasound and blood pressure cuff are used to evaluate the arteries that supply the arms and legs with blood. Allow thirty minutes for this exam. There are no restrictions or special instructions.   Follow-Up: At [facility], you and your health needs are our priority.  As part of our continuing mission to provide you with exceptional heart care, we have created designated Provider Care Teams.  These Care Teams include your primary Cardiologist (physician) and Advanced Practice Providers (APPs -  Physician Assistants and Nurse Practitioners) who all work together to provide you with the care you need, when you need it.  We recommend signing up for the patient portal called "MyChart".  Sign up information is provided on this After Visit Summary.  MyChart is used to connect with patients for Virtual Visits (Telemedicine).  Patients are able to view  lab/test results, encounter notes, upcoming appointments, etc.  Non-urgent messages can be sent to your provider as well.   To learn more about what you can do with MyChart, go to [email or web address].    Your next appointment:   6 month(s)  The format for your next appointment:   In Person  Provider:   You may see Freada Bergeron, MD or one of the following Advanced Practice Providers on your designated Care Team:    Richardson Dopp, PA-C  [place], [place]    Other Instructions   You have been referred to the vein specialist at [facility].  That office will contact you to schedule an appointment.      Signed, Freada Bergeron, MD  08/21/2020 12:11 PM    [place]

## 2020-08-21 ENCOUNTER — Encounter: Payer: Self-pay | Admitting: Cardiology

## 2020-08-21 ENCOUNTER — Other Ambulatory Visit: Payer: Self-pay

## 2020-08-21 ENCOUNTER — Ambulatory Visit (INDEPENDENT_AMBULATORY_CARE_PROVIDER_SITE_OTHER): Payer: Medicare Other | Admitting: Cardiology

## 2020-08-21 VITALS — BP 100/60 | HR 100 | Ht 60.5 in | Wt 141.0 lb

## 2020-08-21 DIAGNOSIS — M79605 Pain in left leg: Secondary | ICD-10-CM

## 2020-08-21 DIAGNOSIS — M79604 Pain in right leg: Secondary | ICD-10-CM

## 2020-08-21 DIAGNOSIS — E119 Type 2 diabetes mellitus without complications: Secondary | ICD-10-CM

## 2020-08-21 DIAGNOSIS — E7800 Pure hypercholesterolemia, unspecified: Secondary | ICD-10-CM | POA: Diagnosis not present

## 2020-08-21 DIAGNOSIS — R600 Localized edema: Secondary | ICD-10-CM | POA: Diagnosis not present

## 2020-08-21 DIAGNOSIS — N1832 Chronic kidney disease, stage 3b: Secondary | ICD-10-CM

## 2020-08-21 NOTE — Patient Instructions (Signed)
Medication Instructions:  Your physician recommends that you continue on your current medications as directed. Please refer to the Current Medication list given to you today.  *If you need a refill on your cardiac medications before your next appointment, please call your pharmacy*   Lab Work: None If you have labs (blood work) drawn today and your tests are completely normal, you will receive your results only by: Marland Kitchen MyChart Message (if you have MyChart) OR . A paper copy in the mail If you have any lab test that is abnormal or we need to change your treatment, we will call you to review the results.   Testing/Procedures: Your physician has requested that you have a lower extremity arterial duplex and ABIs. This test is an ultrasound of the arteries in the legs. It looks at arterial blood flow in the legs. Allow one hour for Lower Arterial scans. There are no restrictions or special instructions  Your physician has requested that you have an ankle brachial index (ABI). During this test an ultrasound and blood pressure cuff are used to evaluate the arteries that supply the arms and legs with blood. Allow thirty minutes for this exam. There are no restrictions or special instructions.   Follow-Up: At [facility], you and your health needs are our priority.  As part of our continuing mission to provide you with exceptional heart care, we have created designated Provider Care Teams.  These Care Teams include your primary Cardiologist (physician) and Advanced Practice Providers (APPs -  Physician Assistants and Nurse Practitioners) who all work together to provide you with the care you need, when you need it.  We recommend signing up for the patient portal called "MyChart".  Sign up information is provided on this After Visit Summary.  MyChart is used to connect with patients for Virtual Visits (Telemedicine).  Patients are able to view lab/test results, encounter notes, upcoming appointments,  etc.  Non-urgent messages can be sent to your provider as well.   To learn more about what you can do with MyChart, go to [email or web address].    Your next appointment:   6 month(s)  The format for your next appointment:   In Person  Provider:   You may see Freada Bergeron, MD or one of the following Advanced Practice Providers on your designated Care Team:    Richardson Dopp, PA-C  [place], [place]    Other Instructions   You have been referred to the vein specialist at [facility].  That office will contact you to schedule an appointment.

## 2020-08-22 ENCOUNTER — Other Ambulatory Visit: Payer: Self-pay | Admitting: *Deleted

## 2020-08-22 NOTE — Patient Outreach (Signed)
[facility]) Care Management  08/22/2020  [name] 1930/03/02 [id number]   Outgoing call placed to daughter, no answer, HIPAA compliant voice message left.  Call then placed to member directly.  She still does not feel she is improving well with her mobility since surgery.  Unable to stand for long periods of time, unable to take any steps without her walker.  Still active with PT, once a week, follow up with surgeon next week.  Swelling remains an issue, tests for blood clots are negative, will have another vascular test on 3/1.  Was seen by PCP on 2/7 for complaints of body jerking, per PCP note, will need to see neurology, referral placed.  She is aware of this recommendation, either she or daughter will follow up.  Advised of transition to newly assigned RNCM, she verbalizes understanding.  Patient is being transitioned to Chronic Care Management with the primary provider office, case closed.  Goals Addressed            This Visit's Progress   . COMPLETED: THN - Keep Pain Under Control       Follow Up Date 08/22/2020  Timeframe:  Short-Term Goal Priority:  Medium Start Date:     06/28/2020                        Expected End Date:       07/29/2020                  - bring pain diary to all appointments - call for medicine refill 2 or 3 days before it runs out - develop a personal pain management plan - keep track of prescription refills - plan exercise or activity when pain is best controlled - stay active - track times pain is worst and when it is best - track what makes the pain worse and what makes it better    Why is this important?   Day-to-day life can be hard when you have back pain.  Pain medicine is just one piece of the treatment puzzle. There are many things you can do to manage pain and keep your back strong.   Lifestyle changes, like stopping smoking and eating foods with Vitamin D and calcium, keep your bones and muscles healthy. Your back  is better when it is supported by strong muscles.  You can try these action steps to help you manage your pain.     Notes:   11/24 - Pain control discussed with daughter, reviewed pain meds  2/15 - Patient is being transitioned to Chronic Care Management with the primary provider office, case closed.    . COMPLETEDMargie Billet - Stay Active and Independent       Follow Up Date 08/22/2020   Timeframe:  Long-Range Goal Priority:  High Start Date:      06/28/2020                       Expected End Date:    08/29/2020                   - choose a type of activity I enjoy - get out for a short walk before breakfast, after dinner or both - plan family outings that include physical activity, like hiking, backpacking, swimming - walk indoors    Why is this important?   Regular activity or exercise is important to managing back pain.  Activity helps to keep your muscles strong.  You will sleep better and feel more relaxed.  You will have more energy and feel less stressed.  If you are not active now, start slowly. Little changes make a big difference.  Rest, but not too much.  Stay as active as you can and listen to your body's signals.     Notes:   12/22 - Referral to CSW for counseling in effort to improve quality of care  1/18 - Remains active with [facility] for nursing and PT  2/15 - Patient is being transitioned to Chronic Care Management with the primary provider office, case closed.      Valente David, [place], MSN [place] [phone]

## 2020-08-23 DIAGNOSIS — E1122 Type 2 diabetes mellitus with diabetic chronic kidney disease: Secondary | ICD-10-CM | POA: Diagnosis not present

## 2020-08-23 DIAGNOSIS — D631 Anemia in chronic kidney disease: Secondary | ICD-10-CM | POA: Diagnosis not present

## 2020-08-23 DIAGNOSIS — N183 Chronic kidney disease, stage 3 unspecified: Secondary | ICD-10-CM | POA: Diagnosis not present

## 2020-08-23 DIAGNOSIS — M5136 Other intervertebral disc degeneration, lumbar region: Secondary | ICD-10-CM | POA: Diagnosis not present

## 2020-08-23 DIAGNOSIS — M4316 Spondylolisthesis, lumbar region: Secondary | ICD-10-CM | POA: Diagnosis not present

## 2020-08-23 DIAGNOSIS — M48062 Spinal stenosis, lumbar region with neurogenic claudication: Secondary | ICD-10-CM | POA: Diagnosis not present

## 2020-08-25 DIAGNOSIS — D631 Anemia in chronic kidney disease: Secondary | ICD-10-CM | POA: Diagnosis not present

## 2020-08-25 DIAGNOSIS — E1122 Type 2 diabetes mellitus with diabetic chronic kidney disease: Secondary | ICD-10-CM | POA: Diagnosis not present

## 2020-08-25 DIAGNOSIS — M4316 Spondylolisthesis, lumbar region: Secondary | ICD-10-CM | POA: Diagnosis not present

## 2020-08-25 DIAGNOSIS — N183 Chronic kidney disease, stage 3 unspecified: Secondary | ICD-10-CM | POA: Diagnosis not present

## 2020-08-25 DIAGNOSIS — M5136 Other intervertebral disc degeneration, lumbar region: Secondary | ICD-10-CM | POA: Diagnosis not present

## 2020-08-25 DIAGNOSIS — R399 Unspecified symptoms and signs involving the genitourinary system: Secondary | ICD-10-CM | POA: Diagnosis not present

## 2020-08-25 DIAGNOSIS — M48062 Spinal stenosis, lumbar region with neurogenic claudication: Secondary | ICD-10-CM | POA: Diagnosis not present

## 2020-08-28 ENCOUNTER — Encounter: Payer: Medicare Other | Admitting: Adult Health

## 2020-08-29 DIAGNOSIS — M4316 Spondylolisthesis, lumbar region: Secondary | ICD-10-CM | POA: Diagnosis not present

## 2020-08-29 DIAGNOSIS — M48062 Spinal stenosis, lumbar region with neurogenic claudication: Secondary | ICD-10-CM | POA: Diagnosis not present

## 2020-08-30 DIAGNOSIS — D631 Anemia in chronic kidney disease: Secondary | ICD-10-CM | POA: Diagnosis not present

## 2020-08-30 DIAGNOSIS — E1122 Type 2 diabetes mellitus with diabetic chronic kidney disease: Secondary | ICD-10-CM | POA: Diagnosis not present

## 2020-08-30 DIAGNOSIS — M5136 Other intervertebral disc degeneration, lumbar region: Secondary | ICD-10-CM | POA: Diagnosis not present

## 2020-08-30 DIAGNOSIS — N183 Chronic kidney disease, stage 3 unspecified: Secondary | ICD-10-CM | POA: Diagnosis not present

## 2020-08-30 DIAGNOSIS — M48062 Spinal stenosis, lumbar region with neurogenic claudication: Secondary | ICD-10-CM | POA: Diagnosis not present

## 2020-08-30 DIAGNOSIS — M4316 Spondylolisthesis, lumbar region: Secondary | ICD-10-CM | POA: Diagnosis not present

## 2020-08-31 ENCOUNTER — Other Ambulatory Visit: Payer: Self-pay | Admitting: Internal Medicine

## 2020-09-04 ENCOUNTER — Other Ambulatory Visit: Payer: Self-pay | Admitting: Cardiology

## 2020-09-04 DIAGNOSIS — E119 Type 2 diabetes mellitus without complications: Secondary | ICD-10-CM

## 2020-09-04 DIAGNOSIS — M79604 Pain in right leg: Secondary | ICD-10-CM

## 2020-09-04 DIAGNOSIS — N312 Flaccid neuropathic bladder, not elsewhere classified: Secondary | ICD-10-CM | POA: Diagnosis not present

## 2020-09-05 ENCOUNTER — Ambulatory Visit (HOSPITAL_COMMUNITY)
Admission: RE | Admit: 2020-09-05 | Discharge: 2020-09-05 | Disposition: A | Discharge status: Home / Self Care | Payer: Medicare Other | Source: Ambulatory Visit | Attending: Cardiology | Admitting: Cardiology

## 2020-09-05 ENCOUNTER — Other Ambulatory Visit: Payer: Self-pay

## 2020-09-05 DIAGNOSIS — M79605 Pain in left leg: Secondary | ICD-10-CM | POA: Diagnosis not present

## 2020-09-05 DIAGNOSIS — M79604 Pain in right leg: Secondary | ICD-10-CM | POA: Diagnosis not present

## 2020-09-05 DIAGNOSIS — E119 Type 2 diabetes mellitus without complications: Secondary | ICD-10-CM | POA: Insufficient documentation

## 2020-09-06 DIAGNOSIS — H353132 Nonexudative age-related macular degeneration, bilateral, intermediate dry stage: Secondary | ICD-10-CM | POA: Diagnosis not present

## 2020-09-06 DIAGNOSIS — H40013 Open angle with borderline findings, low risk, bilateral: Secondary | ICD-10-CM | POA: Diagnosis not present

## 2020-09-06 DIAGNOSIS — H35033 Hypertensive retinopathy, bilateral: Secondary | ICD-10-CM | POA: Diagnosis not present

## 2020-09-06 DIAGNOSIS — E119 Type 2 diabetes mellitus without complications: Secondary | ICD-10-CM | POA: Diagnosis not present

## 2020-09-06 LAB — HM DIABETES EYE EXAM

## 2020-09-07 ENCOUNTER — Telehealth: Payer: Self-pay

## 2020-09-07 DIAGNOSIS — M48062 Spinal stenosis, lumbar region with neurogenic claudication: Secondary | ICD-10-CM | POA: Diagnosis not present

## 2020-09-07 DIAGNOSIS — M5136 Other intervertebral disc degeneration, lumbar region: Secondary | ICD-10-CM | POA: Diagnosis not present

## 2020-09-07 DIAGNOSIS — N183 Chronic kidney disease, stage 3 unspecified: Secondary | ICD-10-CM | POA: Diagnosis not present

## 2020-09-07 DIAGNOSIS — D631 Anemia in chronic kidney disease: Secondary | ICD-10-CM | POA: Diagnosis not present

## 2020-09-07 DIAGNOSIS — E1122 Type 2 diabetes mellitus with diabetic chronic kidney disease: Secondary | ICD-10-CM | POA: Diagnosis not present

## 2020-09-07 DIAGNOSIS — M4316 Spondylolisthesis, lumbar region: Secondary | ICD-10-CM | POA: Diagnosis not present

## 2020-09-07 NOTE — Telephone Encounter (Signed)
Ellie Physical Therapist has requested orders for home visits : Once a week for 10 weeks to address ambulation & strength.   Please advise. Call back phone [phone].

## 2020-09-08 DIAGNOSIS — D631 Anemia in chronic kidney disease: Secondary | ICD-10-CM | POA: Diagnosis not present

## 2020-09-08 DIAGNOSIS — N183 Chronic kidney disease, stage 3 unspecified: Secondary | ICD-10-CM | POA: Diagnosis not present

## 2020-09-08 DIAGNOSIS — M5136 Other intervertebral disc degeneration, lumbar region: Secondary | ICD-10-CM | POA: Diagnosis not present

## 2020-09-08 DIAGNOSIS — M48062 Spinal stenosis, lumbar region with neurogenic claudication: Secondary | ICD-10-CM | POA: Diagnosis not present

## 2020-09-08 DIAGNOSIS — E1122 Type 2 diabetes mellitus with diabetic chronic kidney disease: Secondary | ICD-10-CM | POA: Diagnosis not present

## 2020-09-08 DIAGNOSIS — M4316 Spondylolisthesis, lumbar region: Secondary | ICD-10-CM | POA: Diagnosis not present

## 2020-09-10 NOTE — Telephone Encounter (Signed)
She is going to follow up with PCP for therapies.  Thanks.

## 2020-09-11 ENCOUNTER — Telehealth: Payer: Self-pay | Admitting: Internal Medicine

## 2020-09-11 NOTE — Telephone Encounter (Signed)
Kelly w/[facility] is calling in to get verbal order to extend the pts PT for 1 week 10 .  May leave a detail msg on her secured voice mail.

## 2020-09-11 NOTE — Telephone Encounter (Signed)
Okay for verbal 

## 2020-09-11 NOTE — Telephone Encounter (Signed)
Home Health notified.

## 2020-09-11 NOTE — Telephone Encounter (Signed)
Left message on secure line

## 2020-09-11 NOTE — Telephone Encounter (Signed)
Ok to continue PT.

## 2020-09-13 DIAGNOSIS — E1122 Type 2 diabetes mellitus with diabetic chronic kidney disease: Secondary | ICD-10-CM | POA: Diagnosis not present

## 2020-09-13 DIAGNOSIS — N183 Chronic kidney disease, stage 3 unspecified: Secondary | ICD-10-CM | POA: Diagnosis not present

## 2020-09-13 DIAGNOSIS — M48062 Spinal stenosis, lumbar region with neurogenic claudication: Secondary | ICD-10-CM | POA: Diagnosis not present

## 2020-09-13 DIAGNOSIS — M5136 Other intervertebral disc degeneration, lumbar region: Secondary | ICD-10-CM | POA: Diagnosis not present

## 2020-09-13 DIAGNOSIS — M4316 Spondylolisthesis, lumbar region: Secondary | ICD-10-CM | POA: Diagnosis not present

## 2020-09-13 DIAGNOSIS — D631 Anemia in chronic kidney disease: Secondary | ICD-10-CM | POA: Diagnosis not present

## 2020-09-15 ENCOUNTER — Encounter: Payer: Self-pay | Admitting: Adult Health

## 2020-09-15 ENCOUNTER — Inpatient Hospital Stay: Payer: Medicare Other | Attending: Adult Health | Admitting: Adult Health

## 2020-09-15 ENCOUNTER — Other Ambulatory Visit: Payer: Self-pay

## 2020-09-15 ENCOUNTER — Telehealth: Payer: Self-pay | Admitting: Adult Health

## 2020-09-15 VITALS — BP 117/48 | HR 102 | Temp 98.2°F | Resp 17 | Ht 60.5 in | Wt 142.9 lb

## 2020-09-15 DIAGNOSIS — C50411 Malignant neoplasm of upper-outer quadrant of right female breast: Secondary | ICD-10-CM

## 2020-09-15 DIAGNOSIS — Z853 Personal history of malignant neoplasm of breast: Secondary | ICD-10-CM | POA: Insufficient documentation

## 2020-09-15 DIAGNOSIS — Z171 Estrogen receptor negative status [ER-]: Secondary | ICD-10-CM

## 2020-09-15 NOTE — Telephone Encounter (Signed)
Scheduled appts per 3/11 los. Gave pt a print out of AVS.  

## 2020-09-15 NOTE — Progress Notes (Signed)
CLINIC:  Survivorship   REASON FOR VISIT:  Routine follow-up for history of breast cancer.   BRIEF ONCOLOGIC HISTORY:  Oncology History Overview Note      Breast cancer of upper-outer quadrant of right female breast ([place])  06/27/2015 Mammogram   New oval focal asymmetry in right breast, posterior depth superior region   07/19/2015 Initial Diagnosis   Right breast biopsy: Invasive ductal carcinoma grade 2-3, PR 0%, PR 0%, Ki-67 20%, HER-2 negative ratio 1.77; 5 mm abnormality at 10:00 position   07/19/2015 Clinical Stage   Stage IA: T1b N0   08/02/2015 Procedure   Breast/Ovarian panel (geneDx) no deleterious mutations at ATM, BARD1, BRCA1, BRCA2, BRIP1, CDH1, CHEK2, EPCAM, FANCC, MLH1, MSH2, MSH6, NBN, PALB2, PMS2, PTEN, RAD51C, RAD51D, TP53, and XRCC2   08/03/2015 Surgery   Rt. Lumpectomy: IDC, 1.2 cm, Grade 3, with DCIS (in situ margin 0.1 cm), LN not taken, Er 0%, PR 0%, Her 2 Neg equivocal on FISH (ratio 1.93),  Negative for HER2 on IHC.   08/03/2015 Pathologic Stage   Stage IA: T1c Nx   09/13/2015 - 10/12/2015 Radiation Therapy   Adjuvant radiation therapy: 42.72 gray in 16 fractions directed right breast with a boost of 12 gray in 6 fractions directed at the lumpectomy cavity. Total dose: 54.72 Gy   12/21/2015 Survivorship   SCP visit completed      INTERVAL HISTORY:  [name] presents to the [facility] today for routine follow-up for her history of breast cancer.  She is accompanied by her daughter today.  She notes that she is feeling well, other than having back surgery about 4 months ago.  She is slowly recovering, however it has been longer and more challenging than she imagined.  She is doing much better than she was initially, and understands that at her age of 87, it is going to take as long as a year.  She continues to make progress.  She is up to date with her PCP visits, and remains as active as she can be considering the circumstances with her back.  She  no longer requires GYN or colon cancer screening.    Her most recent mammogram was completed on 09/14/2019 and showed no evidence of malignancy.  She has her next mammogram scheduled in a couple of weeks.    REVIEW OF SYSTEMS:  Review of Systems  Constitutional: Negative for appetite change, chills, fatigue, fever and unexpected weight change.  HENT:   Negative for hearing loss and lump/mass.   Eyes: Negative for eye problems and icterus.  Respiratory: Negative for chest tightness, cough and shortness of breath.   Cardiovascular: Negative for chest pain, leg swelling and palpitations.  Gastrointestinal: Negative for abdominal distention, abdominal pain, constipation, diarrhea, nausea and vomiting.  Endocrine: Negative for hot flashes.  Skin: Negative for itching and rash.  Neurological: Negative for dizziness, extremity weakness, headaches and numbness.  Hematological: Negative for adenopathy. Does not bruise/bleed easily.  Psychiatric/Behavioral: Negative for depression. The patient is not nervous/anxious.   Breast: Denies any new nodularity, masses, tenderness, nipple changes, or nipple discharge.       PAST MEDICAL/SURGICAL HISTORY:  Past Medical History:  Diagnosis Date  . Anemia   . Arthritis   . Bilateral edema of lower extremity   . Breast cancer of upper-outer quadrant of right female breast [facility]) dx 07/19/2015--- oncologist-  dr Lindi Adie dr kinard   DCIS,  grade 3, 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 , HER2/neu negative-- s/p right lumpectomy (  without SLNB) and radiation therapy  . Chronic lower back pain   . CKD (chronic kidney disease) stage 3, GFR 30-59 ml/min (HCC)   . DDD (degenerative disc disease)    lumbar  . Diverticulosis   . Family history of breast cancer   . Family history of pancreatic cancer   . Family history of prostate cancer   . Flaccid neuropathic bladder, not elsewhere classified   . Foley catheter in place   . GERD (gastroesophageal reflux disease)    . Hemorrhoids   . Hiatal hernia   . History of colon polyps   . History of radiation therapy 09-12-2015 to 10-12-2015   42.72 gray in 16 fractions directed right breast w/ boost of 12 gray in 6 fractions directed at the lumpectomy cavity- Total dose: 54.72y  . History of recurrent UTIs   . Hyperlipidemia   . PONV (postoperative nausea and vomiting)   . RBBB (right bundle branch block with left anterior fascicular block)   . Type 2 diabetes mellitus ([place])   . Urinary retention with incomplete bladder emptying   . Wears dentures    full upper and lower partial  . Wears glasses   . Wears hearing aid    bilateral but does not wear   Past Surgical History:  Procedure Laterality Date  . BACK SURGERY    . BREAST LUMPECTOMY WITH RADIOACTIVE SEED LOCALIZATION Right 08/03/2015   Procedure: BREAST LUMPECTOMY WITH RADIOACTIVE SEED LOCALIZATION;  Surgeon: [name], MD;  Location: [facility];  Service: General;  Laterality: Right;  . BREAST SURGERY    . CARDIOVASCULAR STRESS TEST  01/14/2012   Low risk nuclear study w/ small mild apical and apical lateral reversible perfusion defect represents a small area of ischemia versus shifting breast artifact/  normal LV funciton and wall motion, ef 86%  . CARPAL TUNNEL RELEASE Right 1977  . CATARACT EXTRACTION W/ INTRAOCULAR LENS  IMPLANT, BILATERAL Bilateral left 1996/  right 1997  . CLOSED RIGHT KNEE MANIPULATION  08/11/2002   post TKA  . CYSTOSTOMY N/A 05/17/2016   Procedure: CYSTOSCOPY WITH  SUPRAPUBIC PLACMENT;  Surgeon: [name], MD;  Location: [facility];  Service: Urology;  Laterality: N/A;  . EYE SURGERY    . GANGLION CYST EXCISION Right 12/09/2001   right palm  . HERNIA REPAIR    . JOINT REPLACEMENT    . KNEE ARTHROSCOPY Right 12/14/2002   w/ Lysis Adhesions  . LAMINECTOMY  04/24/2020   Bilateral redo L2-3, L3-4 and L4-5 laminotomy/laminectomy/foraminotomies/medial facetectomy to decompress the  bilateral L3, L4 and L5 nerve roots  . LUMBAR LAMINECTOMY/DECOMPRESSION MICRODISCECTOMY N/A 03/06/2016   Procedure: CENTRAL DECOMPRESSION L3 - L4 ,L4 - L5;  Surgeon: [name], MD;  Location: [facility];  Service: Orthopedics;  Laterality: N/A;  . SHOULDER HEMI-ARTHROPLASTY Right 02/20/2009   avascular necrosis   . TOTAL KNEE ARTHROPLASTY Bilateral right 06-23-2002/  left 07-11-2008  . [place]  . VAGINAL HYSTERECTOMY  1975     ALLERGIES:  Allergies  Allergen Reactions  . Phenergan [Promethazine]     Slurred speech, acted very confused  . Remeron [Mirtazapine] Itching     CURRENT MEDICATIONS:  Outpatient Encounter Medications as of 09/15/2020  Medication Sig Note  . acetaminophen (TYLENOL) 500 MG tablet Take 500 mg by mouth every 6 (six) hours as needed for mild pain or moderate pain.    Marland Kitchen aspirin EC 81 MG tablet Take 81 mg by mouth daily at  12 noon.    Marland Kitchen atorvastatin (LIPITOR) 10 MG tablet Take 1 tablet (10 mg total) by mouth daily.   . Cholecalciferol (VITAMIN D) 50 MCG (2000 UT) tablet Take 2,000 Units by mouth daily.   . cyanocobalamin 1000 MCG tablet Take 1,000 mcg by mouth at bedtime.    . docusate sodium (COLACE) 100 MG capsule Take 1 capsule (100 mg total) by mouth 2 (two) times daily.   Marland Kitchen esomeprazole (NEXIUM) 40 MG capsule Take 1 capsule (40 mg total) by mouth daily.   . furosemide (LASIX) 40 MG tablet Take 1 tablet (40 mg total) by mouth daily. Or as directed (Patient taking differently: Take 60 mg by mouth daily.) 09/15/2020: 1 daily   . glipiZIDE (GLUCOTROL) 10 MG tablet Take 1 tablet (10 mg total) by mouth in the morning.   . naloxegol oxalate (MOVANTIK) 25 MG TABS tablet Take 1 tablet (25 mg total) by mouth daily.   . polyethylene glycol (MIRALAX / GLYCOLAX) 17 g packet Take 17 g by mouth daily as needed for moderate constipation or severe constipation.   . senna-docusate (SENOKOT-S) 8.6-50 MG tablet Take 2 tablets by mouth 2 (two) times daily.    . traMADol (ULTRAM) 50 MG tablet Take by mouth daily.   . Turmeric 500 MG CAPS Take by mouth daily.    No facility-administered encounter medications on file as of 09/15/2020.     ONCOLOGIC FAMILY HISTORY:  Family History  Problem Relation Age of Onset  . Pancreatic cancer Mother 15  . Diabetes Mother   . Prostate cancer Brother   . Diabetes Father   . Heart disease Father   . Diabetes Brother   . Heart attack Brother   . Diabetes Sister   . Diabetes Daughter   . Diabetes Son   . Coronary artery disease Brother        MI in his 44s  . Colon cancer Neg Hx   . Stomach cancer Neg Hx     GENETIC COUNSELING/TESTING: See above  SOCIAL HISTORY:  [name] is married and lives with her husband in [place], [place] (updated in 05/21/2019 and unchanged).  She has 1 daughter who lives in [place], [place].  Ms. Trigg is currently retired.  She denies any current or history of tobacco, alcohol, or illicit drug use.     PHYSICAL EXAMINATION:  Vital Signs: Vitals:   09/15/20 1404  BP: (!) 117/48  Pulse: (!) 102  Resp: 17  Temp: 98.2 F (36.8 C)  SpO2: 96%   Filed Weights   09/15/20 1404  Weight: 142 lb 14.4 oz (64.8 kg)   General: Well-nourished, well-appearing female in no acute distress.  Accompanied by her daughter.  Examined in wheelchair.   HEENT: Head is normocephalic.  Pupils equal and reactive to light. Mask in place  Lymph: No cervical, supraclavicular, or infraclavicular lymphadenopathy noted on palpation.  Cardiovascular: Regular rate and rhythm.Marland Kitchen Respiratory: Clear to auscultation bilaterally. Chest expansion symmetric; breathing non-labored.  Breast Exam:  -Left breast: No appreciable masses on palpation. No skin redness, thickening, or peau d'orange appearance; no nipple retraction or nipple discharge;  -Right breast: No appreciable masses on palpation. No skin redness, thickening, or peau d'orange appearance; no nipple retraction or nipple  discharge; mild distortion in symmetry at previous lumpectomy site well healed scar without erythema or nodularity. -Axilla: No axillary adenopathy bilaterally.  GI: Abdomen soft and round; non-tender, non-distended. Bowel sounds normoactive. No hepatosplenomegaly.   GU: Deferred.  Neuro: No  focal deficits. Steady gait.  Psych: Mood and affect normal and appropriate for situation.  Extremities: No edema. Skin: Warm and dry.  LABORATORY DATA:  None for this visit   DIAGNOSTIC IMAGING:     ASSESSMENT AND PLAN:  Ms.. Chrismer is a pleasant 87 y.o. female with history of Stage IA right breast invasive ductal carcinoma, ER-/PR-/HER2-, diagnosed in 07/2015, treated with lumpectomy, adjuvant radiation therapy.  She presents to the Survivorship Clinic for surveillance and routine follow-up.   1. History of breast cancer:  Ms. Pamer is currently clinically and radiographically without evidence of disease or recurrence of breast cancer. She is undergoing mammogram in a few weeks.  She will continue these annually.  We discussed her ability to graduate from further follow up at the cancer center, however she will need a clinical breast exam annually.  Considering this she would like to f/u in long term survivorship care, which we are happy to accommodate.  We will see her back in clinic in one year. I encouraged her to call me with any questions or concerns before her next visit at the cancer center, and I would be happy to see her sooner, if needed.    2. Bone health:  Given Ms. Cornick's age, history of breast cancer, she is at risk for bone demineralization. She has declined repeat bone density evaluation and has been given education on specific food and activities to promote bone health.  3. Cancer screening:  Due to Ms. Cude's history and her age, she should receive screening for skin cancers. She was encouraged to follow-up with her PCP for appropriate cancer screenings.   4. Health maintenance  and wellness promotion: Ms. Peckman was encouraged to consume 5-7 servings of fruits and vegetables per day. She was also encouraged to continue to work on her activity level and recovery from her back surgery. She was instructed to limit her alcohol consumption and continue to abstain from tobacco use.  Dispo:  -Return to cancer center in one year with LTS follow up -Mammogram 09/2020  Total encounter time: 20 minutes*  Wilber Bihari, NP 09/18/20 9:19 AM Medical Oncology and Hematology [facility] [place], [place] [address] Tel. [phone]    Fax. [phone]  *Total Encounter Time as defined by the Centers for Medicare and Medicaid Services includes, in addition to the face-to-face time of a patient visit (documented in the note above) non-face-to-face time: obtaining and reviewing outside history, ordering and reviewing medications, tests or procedures, care coordination (communications with other health care professionals or caregivers) and documentation in the medical record.    Note: PRIMARY CARE PROVIDER Burnis Medin, [place] [phone]

## 2020-09-16 DIAGNOSIS — K219 Gastro-esophageal reflux disease without esophagitis: Secondary | ICD-10-CM | POA: Diagnosis not present

## 2020-09-16 DIAGNOSIS — M48062 Spinal stenosis, lumbar region with neurogenic claudication: Secondary | ICD-10-CM | POA: Diagnosis not present

## 2020-09-16 DIAGNOSIS — Z9181 History of falling: Secondary | ICD-10-CM | POA: Diagnosis not present

## 2020-09-16 DIAGNOSIS — Z87440 Personal history of urinary (tract) infections: Secondary | ICD-10-CM | POA: Diagnosis not present

## 2020-09-16 DIAGNOSIS — K5790 Diverticulosis of intestine, part unspecified, without perforation or abscess without bleeding: Secondary | ICD-10-CM | POA: Diagnosis not present

## 2020-09-16 DIAGNOSIS — Z9350 Unspecified cystostomy status: Secondary | ICD-10-CM | POA: Diagnosis not present

## 2020-09-16 DIAGNOSIS — Z853 Personal history of malignant neoplasm of breast: Secondary | ICD-10-CM | POA: Diagnosis not present

## 2020-09-16 DIAGNOSIS — Z79891 Long term (current) use of opiate analgesic: Secondary | ICD-10-CM | POA: Diagnosis not present

## 2020-09-16 DIAGNOSIS — Z7982 Long term (current) use of aspirin: Secondary | ICD-10-CM | POA: Diagnosis not present

## 2020-09-16 DIAGNOSIS — Z981 Arthrodesis status: Secondary | ICD-10-CM | POA: Diagnosis not present

## 2020-09-16 DIAGNOSIS — E1122 Type 2 diabetes mellitus with diabetic chronic kidney disease: Secondary | ICD-10-CM | POA: Diagnosis not present

## 2020-09-16 DIAGNOSIS — Z923 Personal history of irradiation: Secondary | ICD-10-CM | POA: Diagnosis not present

## 2020-09-16 DIAGNOSIS — M1990 Unspecified osteoarthritis, unspecified site: Secondary | ICD-10-CM | POA: Diagnosis not present

## 2020-09-16 DIAGNOSIS — M4316 Spondylolisthesis, lumbar region: Secondary | ICD-10-CM | POA: Diagnosis not present

## 2020-09-16 DIAGNOSIS — Z90710 Acquired absence of both cervix and uterus: Secondary | ICD-10-CM | POA: Diagnosis not present

## 2020-09-16 DIAGNOSIS — N319 Neuromuscular dysfunction of bladder, unspecified: Secondary | ICD-10-CM | POA: Diagnosis not present

## 2020-09-16 DIAGNOSIS — Z7984 Long term (current) use of oral hypoglycemic drugs: Secondary | ICD-10-CM | POA: Diagnosis not present

## 2020-09-16 DIAGNOSIS — Z961 Presence of intraocular lens: Secondary | ICD-10-CM | POA: Diagnosis not present

## 2020-09-16 DIAGNOSIS — E785 Hyperlipidemia, unspecified: Secondary | ICD-10-CM | POA: Diagnosis not present

## 2020-09-16 DIAGNOSIS — M5136 Other intervertebral disc degeneration, lumbar region: Secondary | ICD-10-CM | POA: Diagnosis not present

## 2020-09-16 DIAGNOSIS — I4510 Unspecified right bundle-branch block: Secondary | ICD-10-CM | POA: Diagnosis not present

## 2020-09-16 DIAGNOSIS — Z9841 Cataract extraction status, right eye: Secondary | ICD-10-CM | POA: Diagnosis not present

## 2020-09-16 DIAGNOSIS — Z9842 Cataract extraction status, left eye: Secondary | ICD-10-CM | POA: Diagnosis not present

## 2020-09-16 DIAGNOSIS — N183 Chronic kidney disease, stage 3 unspecified: Secondary | ICD-10-CM | POA: Diagnosis not present

## 2020-09-16 DIAGNOSIS — D631 Anemia in chronic kidney disease: Secondary | ICD-10-CM | POA: Diagnosis not present

## 2020-09-18 ENCOUNTER — Telehealth: Payer: Self-pay

## 2020-09-18 DIAGNOSIS — E1122 Type 2 diabetes mellitus with diabetic chronic kidney disease: Secondary | ICD-10-CM | POA: Diagnosis not present

## 2020-09-18 DIAGNOSIS — M48062 Spinal stenosis, lumbar region with neurogenic claudication: Secondary | ICD-10-CM | POA: Diagnosis not present

## 2020-09-18 DIAGNOSIS — M5136 Other intervertebral disc degeneration, lumbar region: Secondary | ICD-10-CM | POA: Diagnosis not present

## 2020-09-18 DIAGNOSIS — N183 Chronic kidney disease, stage 3 unspecified: Secondary | ICD-10-CM | POA: Diagnosis not present

## 2020-09-18 DIAGNOSIS — M4316 Spondylolisthesis, lumbar region: Secondary | ICD-10-CM | POA: Diagnosis not present

## 2020-09-18 DIAGNOSIS — D631 Anemia in chronic kidney disease: Secondary | ICD-10-CM | POA: Diagnosis not present

## 2020-09-18 NOTE — Telephone Encounter (Signed)
Spoke with patient daughter regarding [place] of lower extremities.  She questioned why picture of lower extremity points to LLE but report says RLE is abnormal.  I reassured her that Dr. Johney Frame has reviewed results and said "Ultrasounds of the leg show no significant disease in the arteries of her legs. This is great news!."  Daughter okay with this explanation but just wondered why picture and summary were different.  Reassured her again that Dr. Johney Frame was not concerned with results.  She said thanks for your help.

## 2020-09-25 ENCOUNTER — Telehealth: Payer: Self-pay | Admitting: *Deleted

## 2020-09-25 DIAGNOSIS — E1122 Type 2 diabetes mellitus with diabetic chronic kidney disease: Secondary | ICD-10-CM | POA: Diagnosis not present

## 2020-09-25 DIAGNOSIS — D631 Anemia in chronic kidney disease: Secondary | ICD-10-CM | POA: Diagnosis not present

## 2020-09-25 DIAGNOSIS — M5136 Other intervertebral disc degeneration, lumbar region: Secondary | ICD-10-CM | POA: Diagnosis not present

## 2020-09-25 DIAGNOSIS — M48062 Spinal stenosis, lumbar region with neurogenic claudication: Secondary | ICD-10-CM | POA: Diagnosis not present

## 2020-09-25 DIAGNOSIS — N183 Chronic kidney disease, stage 3 unspecified: Secondary | ICD-10-CM | POA: Diagnosis not present

## 2020-09-25 DIAGNOSIS — M4316 Spondylolisthesis, lumbar region: Secondary | ICD-10-CM | POA: Diagnosis not present

## 2020-09-25 NOTE — Chronic Care Management (AMB) (Signed)
  Chronic Care Management   Note  09/25/2020 Name: ALYSANDRA LOBUE MRN: [id number] DOB: [birth year]  [name] is a 87 y.o. year old female who is a primary care patient of Panosh, Standley Brooking, MD. I reached out to [name] by phone today in response to a referral sent by Ms. Mayra Reel Antilla's PCP, Dr. Regis Bill.      Ms. Neider was given information about Chronic Care Management services today including:  1. CCM service includes personalized support from designated clinical staff supervised by her physician, including individualized plan of care and coordination with other care providers 2. 24/7 contact phone numbers for assistance for urgent and routine care needs. 3. Service will only be billed when office clinical staff spend 20 minutes or more in a month to coordinate care. 4. Only one practitioner may furnish and bill the service in a calendar month. 5. The patient may stop CCM services at any time (effective at the end of the month) by phone call to the office staff. 6. The patient will be responsible for cost sharing (co-pay) of up to 20% of the service fee (after annual deductible is met).  Patient agreed to services and verbal consent obtained.   Follow up plan: Telephone appointment with care management team member scheduled for:09/28/2020  [facility]

## 2020-09-26 DIAGNOSIS — Z853 Personal history of malignant neoplasm of breast: Secondary | ICD-10-CM | POA: Diagnosis not present

## 2020-09-26 LAB — HM MAMMOGRAPHY

## 2020-09-27 ENCOUNTER — Encounter: Payer: Self-pay | Admitting: Internal Medicine

## 2020-09-28 ENCOUNTER — Ambulatory Visit (INDEPENDENT_AMBULATORY_CARE_PROVIDER_SITE_OTHER): Payer: Medicare Other

## 2020-09-28 DIAGNOSIS — M1610 Unilateral primary osteoarthritis, unspecified hip: Secondary | ICD-10-CM | POA: Diagnosis not present

## 2020-09-28 DIAGNOSIS — M1612 Unilateral primary osteoarthritis, left hip: Secondary | ICD-10-CM

## 2020-09-28 DIAGNOSIS — E1169 Type 2 diabetes mellitus with other specified complication: Secondary | ICD-10-CM | POA: Diagnosis not present

## 2020-09-28 NOTE — Patient Instructions (Signed)
Visit Information  PATIENT GOALS:  Goals Addressed            This Visit's Progress   . Cope with Chronic Pain       Timeframe:  Long-Range Goal Priority:  High Start Date:      09/28/20                       Expected End Date:           01/05/21            Follow Up Date 10/26/20    - learn relaxation techniques - practice acceptance of chronic pain - practice relaxation or meditation daily - think of new ways to do favorite things - use distraction techniques - use relaxation during pain    Why is this important?    Stress makes chronic pain feel worse.   Feelings like depression, anxiety, stress and anger can make your body more sensitive to pain.   Learning ways to cope with stress or depression may help you find some relief from the pain.     Notes:     Marland Kitchen Monitor and Manage My Blood Sugar-Diabetes Type 2       Timeframe:  Long-Range Goal Priority:  Medium Start Date:      09/28/20                       Expected End Date:        01/05/21               Follow Up Date 10/26/2020    - check blood sugar at prescribed times - check blood sugar if I feel it is too high or too low - enter blood sugar readings and medication or insulin into daily log - take the blood sugar log to all doctor visits    Why is this important?    Checking your blood sugar at home helps to keep it from getting very high or very low.   Writing the results in a diary or log helps the doctor know how to care for you.   Your blood sugar log should have the time, date and the results.   Also, write down the amount of insulin or other medicine that you take.   Other information, like what you ate, exercise done and how you were feeling, will also be helpful.     Notes:       Chronic Pain, Adult Chronic pain is a type of pain that lasts or keeps coming back for at least 3-6 months. You may have headaches, pain in the abdomen, or pain in other areas of the body. Chronic pain may be related to  an illness, such as fibromyalgia or complex regional pain syndrome. Chronic pain may also be related to an injury or a health condition. Sometimes, the cause of chronic pain is not known. Chronic pain can make it hard for you to do daily activities. If not treated, chronic pain can lead to anxiety and depression. Treatment depends on the cause and severity of your pain. You may need to work with a pain specialist to come up with a treatment plan. The plan may include medicine, counseling, and physical therapy. Many people benefit from a combination of two or more types of treatment to control their pain. Follow these instructions at home: Medicines  Take over-the-counter and prescription medicines only as told by your health  care provider.  Ask your health care provider if the medicine prescribed to you: ? Requires you to avoid driving or using machinery. ? Can cause constipation. You may need to take these actions to prevent or treat constipation:  Drink enough fluid to keep your urine pale yellow.  Take over-the-counter or prescription medicines.  Eat foods that are high in fiber, such as beans, whole grains, and fresh fruits and vegetables.  Limit foods that are high in fat and processed sugars, such as fried or sweet foods. Treatment plan Follow your treatment plan as told by your health care provider. This may include:  Gentle, regular exercise.  Eating a healthy diet that includes foods such as vegetables, fruits, fish, and lean meats.  Cognitive or behavioral therapy that changes the way you think or act in response to the pain. This may help improve how you feel.  Working with a physical therapist.  Meditation, yoga, acupuncture, or massage therapy.  Aroma, color, light, or sound therapy.  Local electrical stimulation. The electrical pulses help to relieve pain by temporarily stopping the nerve impulses that cause you to feel pain.  Injections. These deliver numbing or  pain-relieving medicines into the spine or the area of pain.   Lifestyle  Ask your health care provider whether you should keep a pain diary. Your health care provider will tell you what information to write in the diary. This may include when you have pain, what the pain feels like, and how medicines and other behaviors or treatments help to reduce the pain.  Consider talking with a mental health care provider about how to manage chronic pain.  Consider joining a chronic pain support group.  Try to control or lower your stress levels. Talk with your health care provider about ways to do this.   General instructions  Learn as much as you can about how to manage your chronic pain. Ask your health care provider if an intensive pain rehabilitation program or a chronic pain specialist would be helpful.  Check your pain level as told by your health care provider. Ask your health care provider if you should use a pain scale.  It is up to you to get the results of any tests that were done. Ask your health care provider, or the department that is doing the tests, when your results will be ready.  Keep all follow-up visits as told by your health care provider. This is important. Contact a health care provider if:  Your pain gets worse, or you have new pain.  You have trouble sleeping.  You have trouble doing your normal activities.  Your pain is not controlled with treatment.  You have side effects from pain medicine.  You feel weak.  You notice any other changes that show that your condition is getting worse. Get help right away if:  You lose feeling or have numbness in your body.  You lose control of bowel or bladder function.  Your pain suddenly gets much worse.  You develop shaking or chills.  You develop confusion.  You develop chest pain.  You have trouble breathing or shortness of breath.  You pass out.  You have thoughts about hurting yourself or others. If you  ever feel like you may hurt yourself or others, or have thoughts about taking your own life, get help right away. Go to your nearest emergency department or:  Call your local emergency services (911 in the [place]  Call a suicide crisis helpline, such as  the National Suicide Prevention Lifeline at 510-594-2445. This is open 24 hours a day in the [place]  Text the Crisis Text Line at 413-735-2129 (in the [place].). Summary  Chronic pain is a type of pain that lasts or keeps coming back for at least 3-6 months.  Chronic pain may be related to an illness, injury, or other health condition. Sometimes, the cause of chronic pain is not known.  Treatment depends on the cause and severity of your pain.  Many people benefit from a combination of two or more types of treatment to control their pain.  Follow your treatment plan as told by your health care provider. This information is not intended to replace advice given to you by your health care provider. Make sure you discuss any questions you have with your health care provider. Document Revised: 03/11/2019 Document Reviewed: 03/11/2019 Elsevier Patient Education  2021 [place].  Diabetes Mellitus Action Plan Following a diabetes action plan is a way for you to manage your diabetes (diabetes mellitus) symptoms. The plan is color-coded to help you understand what actions you need to take based on any symptoms you are having.  If you have symptoms in the red zone, you need medical care right away.  If you have symptoms in the yellow zone, you are having problems.  If you have symptoms in the green zone, you are doing well. Learning about and understanding diabetes can take time. Follow the plan that you develop with your health care provider. Know the target range for your blood sugar (glucose) level, and review your treatment plan with your health care provider at each visit. The target range for my blood sugar level is __________________________  mg/dL. Red zone Get medical help right away if you have any of the following symptoms:  A blood sugar test result that is below 54 mg/dL (3 mmol/L).  A blood sugar test result that is at or above 240 mg/dL (13.3 mmol/L) for 2 days in a row.  Confusion or trouble thinking clearly.  Difficulty breathing.  Sickness or a fever for 2 or more days that is not getting better.  Moderate or large ketone levels in your urine.  Feeling tired or having no energy. If you have any red zone symptoms, do not wait to see if the symptoms will go away. Get medical help right away. Call your local emergency services (911 in the [place] Do not drive yourself to the hospital. If you have severely low blood sugar (severe hypoglycemia) and you cannot eat or drink, you may need glucagon. Make sure a family member or close friend knows how to check your blood sugar and how to give you glucagon. You may need to be treated in a hospital for this condition.   Yellow zone If you have any of the following symptoms, your diabetes is not under control and you may need to make some changes:  A blood sugar test result that is at or above 240 mg/dL (13.3 mmol/L) for 2 days in a row.  Blood sugar test results that are below 70 mg/dL (3.9 mmol/L).  Other symptoms of hypoglycemia, such as: ? Shaking or feeling light-headed. ? Confusion or irritability. ? Feeling hungry. ? Having a fast heartbeat. If you have any yellow zone symptoms:  Treat your hypoglycemia by eating or drinking 15 grams of a rapid-acting carbohydrate. Follow the 15:15 rule: ? Take 15 grams of a rapid-acting carbohydrate, such as:  1 tube of glucose gel.  4 glucose pills.  4 oz (120 mL) of fruit juice.  4 oz (120 mL) of regular (not diet) soda. ? Check your blood sugar 15 minutes after you take the carbohydrate. ? If the repeat blood sugar test is still at or below 70 mg/dL (3.9 mmol/L), take 15 grams of a carbohydrate again. ? If your blood  sugar does not increase above 70 mg/dL (3.9 mmol/L) after 3 tries, get medical help right away. ? After your blood sugar returns to normal, eat a meal or a snack within 1 hour.  Keep taking your daily medicines as told by your health care provider.  Check your blood sugar more often than you normally would. ? Write down your results. ? Call your health care provider if you have trouble keeping your blood sugar in your target range.   Green zone These signs mean you are doing well and you can continue what you are doing to manage your diabetes:  Your blood sugar is within your personal target range. For most people, a blood sugar level before a meal (preprandial) should be 80-130 mg/dL (4.4-7.2 mmol/L).  You feel well, and you are able to do daily activities. If you are in the green zone, continue to manage your diabetes as told by your health care provider. To do this:  Eat a healthy diet.  Exercise regularly.  Check your blood sugar as told by your health care provider.  Take your medicines as told by your health care provider.   Where to find more information  American Diabetes Association (ADA): diabetes.org  Association of Diabetes Care & Education Specialists (ADCES): diabeteseducator.org Summary  Following a diabetes action plan is a way for you to manage your diabetes symptoms. The plan is color-coded to help you understand what actions you need to take based on any symptoms you are having.  Follow the plan that you develop with your health care provider. Make sure you know your personal target blood sugar level.  Review your treatment plan with your health care provider at each visit. This information is not intended to replace advice given to you by your health care provider. Make sure you discuss any questions you have with your health care provider. Document Revised: 12/30/2019 Document Reviewed: 12/30/2019 Elsevier Patient Education  2021 [place].   Consent  to CCM Services: Ms. Ibsen was given information about Chronic Care Management services today including:  1. CCM service includes personalized support from designated clinical staff supervised by her physician, including individualized plan of care and coordination with other care providers 2. 24/7 contact phone numbers for assistance for urgent and routine care needs. 3. Service will only be billed when office clinical staff spend 20 minutes or more in a month to coordinate care. 4. Only one practitioner may furnish and bill the service in a calendar month. 5. The patient may stop CCM services at any time (effective at the end of the month) by phone call to the office staff. 6. The patient will be responsible for cost sharing (co-pay) of up to 20% of the service fee (after annual deductible is met).  Patient agreed to services and verbal consent obtained.   Patient verbalizes understanding of instructions provided today and agrees to view in [place].   Telephone follow up appointment with care management team member scheduled for: 10/26/20 at 10:45 AM Peter Garter RN, Jackquline Denmark, CDE Care Management Coordinator [facility] [phone], Mobile [phone]  CLINICAL CARE PLAN: Patient Care Plan: RNCM:Diabetes Type 2 (Adult)  Problem Identified: Glycemic Management (Diabetes, Type 2)   Priority: Medium    Long-Range Goal: Glycemic Management Optimized   Start Date: 09/28/2020  Expected End Date: 01/05/2021  This Visit's Progress: On track  Priority: Medium  Note:   Objective:  Lab Results  Component Value Date   HGBA1C 6.9 (H) 08/14/2020 .   Lab Results  Component Value Date   CREATININE 1.47 (H) 08/14/2020   CREATININE 1.25 (H) 06/14/2020   CREATININE 1.15 (H) 05/31/2020 .   Lab Results  Component Value Date   EGFR 32 (L) 07/26/2015 .   Current Barriers:  Marland Kitchen Knowledge Deficits related to basic Diabetes pathophysiology and self  care/management . Knowledge Deficits related to medications used for management of diabetes . Unable to independently daughter assists with most IADLS, impaired mobility  . Does not adhere to provider recommendations re: exercise, diet . Unable to perform IADLs independently Case Manager Clinical Goal(s):  . patient will demonstrate improved adherence to prescribed treatment plan for diabetes self care/management as evidenced by: daily monitoring and recording of CBG  adherence to ADA/ carb modified diet adherence to prescribed medication regimen contacting provider for new or worsened symptoms or questions Interventions:  . Collaboration with Panosh, Standley Brooking, MD regarding development and update of comprehensive plan of care as evidenced by provider attestation and co-signature . Inter-disciplinary care team collaboration (see longitudinal plan of care) . Provided education to patient about basic DM disease process . Reviewed medications with patient and discussed importance of medication adherence . Discussed plans with patient for ongoing care management follow up and provided patient with direct contact information for care management team . Provided patient with written educational materials related to hypo and hyperglycemia and importance of correct treatment . Reviewed scheduled/upcoming provider appointments including: no upcoming Primary care provider, Vascular 10/23/20 . Advised patient, providing education and rationale, to check cbg daily and record, calling primary care provider for findings outside established parameters.   . Review of patient status, including review of consultants reports, relevant laboratory and other test results, and medications completed. Self-Care Activities - Self administers oral medications as prescribed Attends all scheduled provider appointments Checks blood sugars as prescribed and utilize hyper and hypoglycemia protocol as needed Adheres to prescribed  ADA/carb modified Patient Goals: - check blood sugar at prescribed times - check blood sugar if I feel it is too high or too low - enter blood sugar readings and medication or insulin into daily log - take the blood sugar log to all doctor visits - drink 6 to 8 glasses of water each day - fill half of plate with vegetables - limit fast food meals to no more than 1 per week - read food labels for fat, fiber, carbohydrates and portion size - keep appointment with eye doctor - check feet daily for cuts, sores or redness - keep feet up while sitting - wear comfortable, well-fitting shoes - check blood sugar at prescribed times - check blood sugar if I feel it is too high or too low - enter blood sugar readings and medication or insulin into daily log - take the blood sugar log to all doctor visits Follow Up Plan: Telephone follow up appointment with care management team member scheduled for: 10/26/20 at 10:45 AM   Patient Care Plan: RNCM:Chronic Pain (Adult)    Problem Identified: Chronic Pain Management (Chronic Pain)   Priority: High    Long-Range Goal: Chronic Pain Managed   Start Date: 09/28/2020  Expected End Date: 01/05/2021  This Visit's Progress: On track  Priority: High  Note:   Current Barriers:  Marland Kitchen Knowledge Deficits related to self-health management of chronic pain- lt hip osteoarthritis, degenerative disc disease.  Pt had back surgery 05/02/21, reports still weak and having issues with lt hip and leg pain, Currently getting home health PT, uses walker at all times, reports taking Tramadol at bedtime and Tylenol during the day for pain relief, Daughter assists pt with transportation and her care as needed.  Husband assists if needed and does some cooking. . Chronic Disease Management support and education needs related to chronic pain . Knowledge Deficits related to self management of chronic pain . Chronic Disease Management support and education needs related to self  management of chronic pain . Unable to independently self manage chronic pain . Unable to perform IADLs independently Clinical Goal(s):  . patient will verbalize understanding of plan for pain management. , patient will attend all scheduled medical appointments: no primary care provider scheduled, vascular 10/23/20 , patient will demonstrate use of different relaxation  skills and/or diversional activities to assist with pain reduction (distraction, imagery, relaxation, massage, acupressure, TENS, heat, and cold application., patient will report pain at a level less than 3 to 4 on a 10-10 rating scale., patient will use pharmacological and nonpharmacological pain relief strategies as prescribed. , and patient will verbalize acceptable level of pain relief and ability to engage in desired activities Interventions:  . Collaboration with Panosh, Standley Brooking, MD regarding development and update of comprehensive plan of care as evidenced by provider attestation and co-signature . Pain assessment performed . Medications reviewed . Discussed plans with patient for ongoing care management follow up and provided patient with direct contact information for care management team . Evaluation of current treatment plan related to self management of chronic pain and patient's adherence to plan as established by provider. . Provided education to patient and/or caregiver about advanced directives . Provided education to patient re: self management of chronic pain . Reviewed medications with patient and discussed use of pain medications . Provided patient with written educational materials related to self management of chronic pain . Discussed plans with patient for ongoing care management follow up and provided patient with direct contact information for care management team Patient Goals/Self Care Activities:  . Will self-administer medications as prescribed . Will attend all scheduled provider appointments . Will  call pharmacy for medication refills 7 days prior to needed refill date . Patient will calls provider office for new concerns or questions . - learn relaxation techniques . - practice acceptance of chronic pain . - practice relaxation or meditation daily . - think of new ways to do favorite things . - use distraction techniques . - use relaxation during pain Follow Up Plan: Telephone follow up appointment with care management team member scheduled for: 10/26/20 at 10:45 AM

## 2020-09-28 NOTE — Chronic Care Management (AMB) (Signed)
Chronic Care Management   CCM RN Visit Note  09/28/2020 Name: [name] MRN: [id number] DOB: 1930-02-06  Subjective: [name] is a 87 y.o. year old female who is a primary care patient of [name], Standley Brooking, MD. The care management team was consulted for assistance with disease management and care coordination needs.    Engaged with patient by telephone for initial visit in response to provider referral for case management and/or care coordination services.   Consent to Services:  The patient was given the following information about Chronic Care Management services today, agreed to services, and gave verbal consent: 1. CCM service includes personalized support from designated clinical staff supervised by the primary care provider, including individualized plan of care and coordination with other care providers 2. 24/7 contact phone numbers for assistance for urgent and routine care needs. 3. Service will only be billed when office clinical staff spend 20 minutes or more in a month to coordinate care. 4. Only one practitioner may furnish and bill the service in a calendar month. 5.The patient may stop CCM services at any time (effective at the end of the month) by phone call to the office staff. 6. The patient will be responsible for cost sharing (co-pay) of up to 20% of the service fee (after annual deductible is met). Patient agreed to services and consent obtained.  Patient agreed to services and verbal consent obtained.   Assessment: Review of patient past medical history, allergies, medications, health status, including review of consultants reports, laboratory and other test data, was performed as part of comprehensive evaluation and provision of chronic care management services.   SDOH (Social Determinants of Health) assessments and interventions performed:  SDOH Interventions   Flowsheet Row Most Recent Value  SDOH Interventions   Food Insecurity Interventions Intervention Not  Indicated  Financial Strain Interventions Intervention Not Indicated  Housing Interventions Intervention Not Indicated  Physical Activity Interventions Other (Comments)  [unable to exercise]  Transportation Interventions Intervention Not Indicated       CCM Care Plan  Allergies  Allergen Reactions  . Phenergan [Promethazine]     Slurred speech, acted very confused  . Remeron [Mirtazapine] Itching    Outpatient Encounter Medications as of 09/28/2020  Medication Sig Note  . acetaminophen (TYLENOL) 500 MG tablet Take 500 mg by mouth every 6 (six) hours as needed for mild pain or moderate pain.    Marland Kitchen aspirin EC 81 MG tablet Take 81 mg by mouth daily at 12 noon.    Marland Kitchen atorvastatin (LIPITOR) 10 MG tablet Take 1 tablet (10 mg total) by mouth daily.   . Cholecalciferol (VITAMIN D) 50 MCG (2000 UT) tablet Take 2,000 Units by mouth daily.   . cyanocobalamin 1000 MCG tablet Take 1,000 mcg by mouth at bedtime.    . docusate sodium (COLACE) 100 MG capsule Take 1 capsule (100 mg total) by mouth 2 (two) times daily.   Marland Kitchen esomeprazole (NEXIUM) 40 MG capsule Take 1 capsule (40 mg total) by mouth daily.   Marland Kitchen glipiZIDE (GLUCOTROL) 10 MG tablet Take 1 tablet (10 mg total) by mouth in the morning.   . naloxegol oxalate (MOVANTIK) 25 MG TABS tablet Take 1 tablet (25 mg total) by mouth daily.   . polyethylene glycol (MIRALAX / GLYCOLAX) 17 g packet Take 17 g by mouth daily as needed for moderate constipation or severe constipation.   . senna-docusate (SENOKOT-S) 8.6-50 MG tablet Take 2 tablets by mouth 2 (two) times daily.   Marland Kitchen  traMADol (ULTRAM) 50 MG tablet Take by mouth daily.   . Turmeric 500 MG CAPS Take by mouth daily.   . furosemide (LASIX) 40 MG tablet Take 1 tablet (40 mg total) by mouth daily. Or as directed (Patient taking differently: Take 60 mg by mouth daily.) 09/15/2020: 1 daily    No facility-administered encounter medications on file as of 09/28/2020.    Patient Active Problem List    Diagnosis Date Noted  . Peripheral edema 07/11/2020  . Sleep disturbance 06/12/2020  . Drug induced constipation   . Neurogenic bladder   . Constipation   . Hypoalbuminemia due to protein-calorie malnutrition ([place])   . Benign essential HTN   . Stage 3b chronic kidney disease ([place])   . Chronic pain syndrome   . Controlled type 2 diabetes mellitus with hyperglycemia, without long-term current use of insulin ([place])   . Lumbar radiculopathy 05/02/2020  . Spondylolisthesis of lumbar region 04/24/2020  . Urinary retention with incomplete bladder emptying   . Hip pain   . Severe back pain   . AKI (acute kidney injury) ([place])   . Intractable pain/ left hip osteoarthritis 12/25/2019  . Spinal stenosis, lumbar region, with neurogenic claudication 03/06/2016  . Malignant neoplasm of right female breast ([place]) 01/11/2016  . Controlled type 2 diabetes mellitus without complication, without long-term current use of insulin ([place]) 01/11/2016  . Genetic testing 08/18/2015  . Family history of breast cancer   . Family history of prostate cancer   . Family history of pancreatic cancer   . Breast cancer of upper-outer quadrant of right female breast ([place]) 07/21/2015  . Insomnia 05/06/2015  . Medication management 12/16/2013  . Hand arthritis 12/16/2013  . Rectal bleeding 12/16/2013  . Cold intolerance 06/17/2013  . Renal insufficiency 12/23/2012  . Post-menopause 12/23/2012  . Hyperlipidemia 12/23/2012  . Postmenopausal HRT (hormone replacement therapy) 12/23/2012  . History of shingles 12/23/2012  . Wears hearing aid 12/23/2012  . Nonspecific abnormal unspecified cardiovascular function study 02/28/2012  . Chronic sore throat 01/13/2012  . Hoarse 01/13/2012  . Chest pressure 12/03/2011  . Itching  mid back 12/03/2011  . Abnormal EKG 12/03/2011  . Medicare annual wellness visit, subsequent 12/03/2011  . DJD (degenerative joint disease) 12/03/2011  . DIABETES MELLITUS, TYPE II, CONTROLLED  11/15/2009  . VITAMIN D DEFICIENCY 11/15/2009  . EXOGENOUS OBESITY 11/15/2009  . ANXIETY DEPRESSION 12/22/2008  . HYPERTHYROIDISM 08/23/2008  . TOTAL KNEE REPLACEMENT, LEFT, HX OF 08/23/2008  . Hypothyroidism 10/08/2007  . ANEMIA 10/08/2007  . Osteoarthritis 10/08/2007  . OSTEOPENIA 10/08/2007  . Dyslipidemia 11/07/2006  . GERD (gastroesophageal reflux disease) 11/07/2006    Conditions to be addressed/monitored:DMII and chronic pain, osteoarthritis  Care Plan : RNCM:Diabetes Type 2 (Adult)  Updates made by Dimitri Ped, RN since 09/28/2020 12:00 AM    Problem: Glycemic Management (Diabetes, Type 2)   Priority: Medium    Long-Range Goal: Glycemic Management Optimized   Start Date: 09/28/2020  Expected End Date: 01/05/2021  This Visit's Progress: On track  Priority: Medium  Note:   Objective:  Lab Results  Component Value Date   HGBA1C 6.9 (H) 08/14/2020 .   Lab Results  Component Value Date   CREATININE 1.47 (H) 08/14/2020   CREATININE 1.25 (H) 06/14/2020   CREATININE 1.15 (H) 05/31/2020 .   Lab Results  Component Value Date   EGFR 32 (L) 07/26/2015 .   Current Barriers:  Marland Kitchen Knowledge Deficits related to basic Diabetes pathophysiology and self care/management . Knowledge Deficits  related to medications used for management of diabetes . Unable to independently daughter assists with most IADLS, impaired mobility  . Does not adhere to provider recommendations re: exercise, diet . Unable to perform IADLs independently Case Manager Clinical Goal(s):  . patient will demonstrate improved adherence to prescribed treatment plan for diabetes self care/management as evidenced by: daily monitoring and recording of CBG  adherence to ADA/ carb modified diet adherence to prescribed medication regimen contacting provider for new or worsened symptoms or questions Interventions:  . Collaboration with [name], Standley Brooking, MD regarding development and update of comprehensive plan of care  as evidenced by provider attestation and co-signature . Inter-disciplinary care team collaboration (see longitudinal plan of care) . Provided education to patient about basic DM disease process . Reviewed medications with patient and discussed importance of medication adherence . Discussed plans with patient for ongoing care management follow up and provided patient with direct contact information for care management team . Provided patient with written educational materials related to hypo and hyperglycemia and importance of correct treatment . Reviewed scheduled/upcoming provider appointments including: no upcoming Primary care provider, Vascular 10/23/20 . Advised patient, providing education and rationale, to check cbg daily and record, calling primary care provider for findings outside established parameters.   . Review of patient status, including review of consultants reports, relevant laboratory and other test results, and medications completed. Self-Care Activities - Self administers oral medications as prescribed Attends all scheduled provider appointments Checks blood sugars as prescribed and utilize hyper and hypoglycemia protocol as needed Adheres to prescribed ADA/carb modified Patient Goals: - check blood sugar at prescribed times - check blood sugar if I feel it is too high or too low - enter blood sugar readings and medication or insulin into daily log - take the blood sugar log to all doctor visits - drink 6 to 8 glasses of water each day - fill half of plate with vegetables - limit fast food meals to no more than 1 per week - read food labels for fat, fiber, carbohydrates and portion size - keep appointment with eye doctor - check feet daily for cuts, sores or redness - keep feet up while sitting - wear comfortable, well-fitting shoes - check blood sugar at prescribed times - check blood sugar if I feel it is too high or too low - enter blood sugar readings and  medication or insulin into daily log - take the blood sugar log to all doctor visits Follow Up Plan: Telephone follow up appointment with care management team member scheduled for: 10/26/20 at 10:45 AM   Care Plan : RNCM:Chronic Pain (Adult)  Updates made by Dimitri Ped, RN since 09/28/2020 12:00 AM    Problem: Chronic Pain Management (Chronic Pain)   Priority: High    Long-Range Goal: Chronic Pain Managed   Start Date: 09/28/2020  Expected End Date: 01/05/2021  This Visit's Progress: On track  Priority: High  Note:   Current Barriers:  Marland Kitchen Knowledge Deficits related to self-health management of chronic pain- lt hip osteoarthritis, degenerative disc disease.  Pt had back surgery 05/02/21, reports still weak and having issues with lt hip and leg pain, Currently getting home health PT, uses walker at all times, reports taking Tramadol at bedtime and Tylenol during the day for pain relief, Daughter assists pt with transportation and her care as needed.  Husband assists if needed and does some cooking. . Chronic Disease Management support and education needs related to chronic pain . Knowledge Deficits  related to self management of chronic pain . Chronic Disease Management support and education needs related to self management of chronic pain . Unable to independently self manage chronic pain . Unable to perform IADLs independently Clinical Goal(s):  . patient will verbalize understanding of plan for pain management. , patient will attend all scheduled medical appointments: no primary care provider scheduled, vascular 10/23/20 , patient will demonstrate use of different relaxation  skills and/or diversional activities to assist with pain reduction (distraction, imagery, relaxation, massage, acupressure, TENS, heat, and cold application., patient will report pain at a level less than 3 to 4 on a 10-10 rating scale., patient will use pharmacological and nonpharmacological pain relief strategies as  prescribed. , and patient will verbalize acceptable level of pain relief and ability to engage in desired activities Interventions:  . Collaboration with [name], Standley Brooking, MD regarding development and update of comprehensive plan of care as evidenced by provider attestation and co-signature . Pain assessment performed . Medications reviewed . Discussed plans with patient for ongoing care management follow up and provided patient with direct contact information for care management team . Evaluation of current treatment plan related to self management of chronic pain and patient's adherence to plan as established by provider. . Provided education to patient and/or caregiver about advanced directives . Provided education to patient re: self management of chronic pain . Reviewed medications with patient and discussed use of pain medications . Provided patient with written educational materials related to self management of chronic pain . Discussed plans with patient for ongoing care management follow up and provided patient with direct contact information for care management team Patient Goals/Self Care Activities:  . Will self-administer medications as prescribed . Will attend all scheduled provider appointments . Will call pharmacy for medication refills 7 days prior to needed refill date . Patient will calls provider office for new concerns or questions . - learn relaxation techniques . - practice acceptance of chronic pain . - practice relaxation or meditation daily . - think of new ways to do favorite things . - use distraction techniques . - use relaxation during pain Follow Up Plan: Telephone follow up appointment with care management team member scheduled for: 10/26/20 at 10:45 AM       Plan:Telephone follow up appointment with care management team member scheduled for:  10/26/20  Peter Garter RN, Jackquline Denmark, CDE Care Management Coordinator [facility] [phone], Mobile [phone]

## 2020-10-02 DIAGNOSIS — D631 Anemia in chronic kidney disease: Secondary | ICD-10-CM | POA: Diagnosis not present

## 2020-10-02 DIAGNOSIS — M48062 Spinal stenosis, lumbar region with neurogenic claudication: Secondary | ICD-10-CM | POA: Diagnosis not present

## 2020-10-02 DIAGNOSIS — M4316 Spondylolisthesis, lumbar region: Secondary | ICD-10-CM | POA: Diagnosis not present

## 2020-10-02 DIAGNOSIS — N183 Chronic kidney disease, stage 3 unspecified: Secondary | ICD-10-CM | POA: Diagnosis not present

## 2020-10-02 DIAGNOSIS — M5136 Other intervertebral disc degeneration, lumbar region: Secondary | ICD-10-CM | POA: Diagnosis not present

## 2020-10-02 DIAGNOSIS — E1122 Type 2 diabetes mellitus with diabetic chronic kidney disease: Secondary | ICD-10-CM | POA: Diagnosis not present

## 2020-10-03 DIAGNOSIS — R3914 Feeling of incomplete bladder emptying: Secondary | ICD-10-CM | POA: Diagnosis not present

## 2020-10-03 DIAGNOSIS — N312 Flaccid neuropathic bladder, not elsewhere classified: Secondary | ICD-10-CM | POA: Diagnosis not present

## 2020-10-09 ENCOUNTER — Telehealth: Payer: Self-pay | Admitting: Internal Medicine

## 2020-10-09 ENCOUNTER — Telehealth: Payer: Self-pay | Admitting: *Deleted

## 2020-10-09 DIAGNOSIS — N183 Chronic kidney disease, stage 3 unspecified: Secondary | ICD-10-CM | POA: Diagnosis not present

## 2020-10-09 DIAGNOSIS — D631 Anemia in chronic kidney disease: Secondary | ICD-10-CM | POA: Diagnosis not present

## 2020-10-09 DIAGNOSIS — M5136 Other intervertebral disc degeneration, lumbar region: Secondary | ICD-10-CM | POA: Diagnosis not present

## 2020-10-09 DIAGNOSIS — M4316 Spondylolisthesis, lumbar region: Secondary | ICD-10-CM | POA: Diagnosis not present

## 2020-10-09 DIAGNOSIS — M48062 Spinal stenosis, lumbar region with neurogenic claudication: Secondary | ICD-10-CM | POA: Diagnosis not present

## 2020-10-09 DIAGNOSIS — E1122 Type 2 diabetes mellitus with diabetic chronic kidney disease: Secondary | ICD-10-CM | POA: Diagnosis not present

## 2020-10-09 NOTE — Telephone Encounter (Signed)
[name] PT called to say that she is having some sacroiliac dysfunction which is limiting her mobility. She called to add sacroiliac joint dysfunction to her POC and they will work on joint mobilization stretching, muscle energy testing to assist with her mobility.  They are going to recommend she see an orthopedist who can maybe do a steroid injection.

## 2020-10-09 NOTE — Telephone Encounter (Signed)
[name] is calling and wanted to request verbal orders to add inside joint treatment to include stretches, massage, muscle energy technique and low grade joint mobilization, please advise. CB is [phone] ok to leave message

## 2020-10-09 NOTE — Telephone Encounter (Signed)
Claiborne Billings PT notified.

## 2020-10-09 NOTE — Telephone Encounter (Signed)
Thank you.  Patient is no longer under my care.

## 2020-10-10 NOTE — Telephone Encounter (Signed)
YOu Need to okay this with her back surgeon   otherwise I am okay with this

## 2020-10-10 NOTE — Telephone Encounter (Signed)
Left a detailed message on [name] voicemail concerning the message below. Claiborne Billings was informed to contact the office if she has any further questions.

## 2020-10-12 ENCOUNTER — Other Ambulatory Visit: Payer: Self-pay | Admitting: Internal Medicine

## 2020-10-12 NOTE — Telephone Encounter (Signed)
Last OV: 08/14/2020 Last Refill:  08/21/2020 Disp: 14   R: 1 Future OV: None Scheduled   Medication is not listed under current medications

## 2020-10-13 ENCOUNTER — Ambulatory Visit: Payer: Self-pay

## 2020-10-13 ENCOUNTER — Encounter: Payer: Self-pay | Admitting: Family Medicine

## 2020-10-13 ENCOUNTER — Ambulatory Visit (INDEPENDENT_AMBULATORY_CARE_PROVIDER_SITE_OTHER): Payer: Medicare Other | Admitting: Family Medicine

## 2020-10-13 DIAGNOSIS — M25552 Pain in left hip: Secondary | ICD-10-CM

## 2020-10-13 NOTE — Progress Notes (Signed)
Office Visit Note   Patient: [name]           Date of Birth: January 14, 1930           MRN: [id number] Visit Date: 10/13/2020 Requested by: Burnis Medin, MD [place],  [place] [id number] PCP: Burnis Medin, MD  Subjective: Chief Complaint  Patient presents with  . Left Hip - Pain    Pain in the posterior hip and down the posterior leg to the ankle x months. S/p lumbar surgery in October 2021 - still going through PT.    HPI: She is here with left-sided posterior hip pain.  She had lumbar fusion in October 2021.  This gave her some relief of her midline low back pain, but she has had ongoing pain in the left posterior hip since then.  Prior to her back surgery she had a left hip diagnostic injection per Dr. Nelva Bush, intra-articular, but apparently it did not help.  She is working with her physical therapist now and the therapist thinks she might benefit from a sacroiliac injection.  Pain radiates down the leg toward the calf.  It hurts constantly.  She uses a walker for support with ambulation.              ROS:   All other systems were reviewed and are negative.  Objective: Vital Signs: There were no vitals taken for this visit.  Physical Exam:  General:  Alert and oriented, in no acute distress. Pulm:  Breathing unlabored. Psy:  Normal mood, congruent affect. Skin: No rash.  Surgical scars well-healed. Left hip: She has restricted range of motion with passive internal hip rotation and this does cause pain, but it does not reproduce her posterior hip pain.  She is point tender over the left SI joint and that seems to reproduce her pain.  Imaging: US Guided Needle Placement  Result Date: 10/13/2020 Ultrasound guided injection is preferred based studies that show increased duration, increased effect, greater accuracy, decreased procedural pain, increased response rate, and decreased cost with ultrasound guided versus blind injection.   Verbal informed consent  obtained.  Time-out conducted.  Noted no overlying erythema, induration, or other signs of local infection. Ultrasound-guided left SI injection: After sterile prep with Betadine, injected 4 cc 0.25% bupivocaine without epinephrine and 6 mg betamethasone using a 22-gauge spinal needle, passing the needle through the sacroiliac ligament into the region of the SI joint.  Not much immediate improvement.     Assessment & Plan: 1.  Left posterior hip pain, suspect due to sacroiliac dysfunction.  She does have end-stage hip DJD as well, but did not have much change with diagnostic injection. -Discussed options with her and elected to inject the left SI joint today.  If this helps but only short-term, could contemplate radiofrequency ablation.     Procedures: No procedures performed        PMFS History: Patient Active Problem List   Diagnosis Date Noted  . Peripheral edema 07/11/2020  . Sleep disturbance 06/12/2020  . Drug induced constipation   . Neurogenic bladder   . Constipation   . Hypoalbuminemia due to protein-calorie malnutrition ([place])   . Benign essential HTN   . Stage 3b chronic kidney disease ([place])   . Chronic pain syndrome   . Controlled type 2 diabetes mellitus with hyperglycemia, without long-term current use of insulin ([place])   . Lumbar radiculopathy 05/02/2020  . Spondylolisthesis of lumbar region 04/24/2020  . Swelling  of left lower extremity 02/18/2020  . Urinary retention with incomplete bladder emptying   . Hip pain   . Severe back pain   . AKI (acute kidney injury) ([place])   . Intractable pain/ left hip osteoarthritis 12/25/2019  . Spinal stenosis, lumbar region, with neurogenic claudication 03/06/2016  . Malignant neoplasm of right female breast ([place]) 01/11/2016  . Controlled type 2 diabetes mellitus without complication, without long-term current use of insulin ([place]) 01/11/2016  . Genetic testing 08/18/2015  . Family history of breast cancer   . Family history of  prostate cancer   . Family history of pancreatic cancer   . Breast cancer of upper-outer quadrant of right female breast ([place]) 07/21/2015  . Insomnia 05/06/2015  . Medication management 12/16/2013  . Hand arthritis 12/16/2013  . Rectal bleeding 12/16/2013  . Cold intolerance 06/17/2013  . Renal insufficiency 12/23/2012  . Post-menopause 12/23/2012  . Hyperlipidemia 12/23/2012  . Postmenopausal HRT (hormone replacement therapy) 12/23/2012  . History of shingles 12/23/2012  . Wears hearing aid 12/23/2012  . Nonspecific abnormal unspecified cardiovascular function study 02/28/2012  . Chronic sore throat 01/13/2012  . Hoarse 01/13/2012  . Chest pressure 12/03/2011  . Itching  mid back 12/03/2011  . Abnormal EKG 12/03/2011  . Medicare annual wellness visit, subsequent 12/03/2011  . DJD (degenerative joint disease) 12/03/2011  . DIABETES MELLITUS, TYPE II, CONTROLLED 11/15/2009  . VITAMIN D DEFICIENCY 11/15/2009  . EXOGENOUS OBESITY 11/15/2009  . ANXIETY DEPRESSION 12/22/2008  . HYPERTHYROIDISM 08/23/2008  . TOTAL KNEE REPLACEMENT, LEFT, HX OF 08/23/2008  . Hypothyroidism 10/08/2007  . ANEMIA 10/08/2007  . Osteoarthritis 10/08/2007  . OSTEOPENIA 10/08/2007  . Dyslipidemia 11/07/2006  . GERD (gastroesophageal reflux disease) 11/07/2006   Past Medical History:  Diagnosis Date  . Anemia   . Arthritis   . Bilateral edema of lower extremity   . 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 Chronic lower back pain   . CKD (chronic kidney disease) stage 3, GFR 30-59 ml/min (HCC)   . DDD (degenerative disc disease)    lumbar  . Diverticulosis   . Family history of breast cancer   . Family history of pancreatic cancer   . Family history of prostate cancer   . Flaccid neuropathic  bladder, not elsewhere classified   . Foley catheter in place   . GERD (gastroesophageal reflux disease)   . Hemorrhoids   . Hiatal hernia   . History of colon polyps   . History of radiation therapy 09-12-2015 to 10-12-2015   42.72 gray in 16 fractions directed right breast w/ boost of 12 gray in 6 fractions directed at the lumpectomy cavity- Total dose: 54.72y  . History of recurrent UTIs   . Hyperlipidemia   . PONV (postoperative nausea and vomiting)   . RBBB (right bundle branch block with left anterior fascicular block)   . Type 2 diabetes mellitus ([place])   . Urinary retention with incomplete bladder emptying   . Wears dentures    full upper and lower partial  . Wears glasses   . Wears hearing aid    bilateral but does not wear    Family History  Problem Relation Age of Onset  . Pancreatic cancer Mother 47  . Diabetes Mother   . Prostate cancer Brother   .  Diabetes Father   . Heart disease Father   . Diabetes Brother   . Heart attack Brother   . Diabetes Sister   . Diabetes Daughter   . Diabetes Son   . Coronary artery disease Brother        MI in his 40s  . Colon cancer Neg Hx   . Stomach cancer Neg Hx     Past Surgical History:  Procedure Laterality Date  . BACK SURGERY    . BREAST LUMPECTOMY WITH RADIOACTIVE SEED LOCALIZATION Right 08/03/2015   Procedure: BREAST LUMPECTOMY WITH RADIOACTIVE SEED LOCALIZATION;  Surgeon: [name], MD;  Location: [facility];  Service: General;  Laterality: Right;  . BREAST SURGERY    . CARDIOVASCULAR STRESS TEST  01/14/2012   Low risk nuclear study w/ small mild apical and apical lateral reversible perfusion defect represents a small area of ischemia versus shifting breast artifact/  normal LV funciton and wall motion, ef 86%  . CARPAL TUNNEL RELEASE Right 1977  . CATARACT EXTRACTION W/ INTRAOCULAR LENS  IMPLANT, BILATERAL Bilateral left 1996/  right 1997  . CLOSED RIGHT KNEE MANIPULATION  08/11/2002   post  TKA  . CYSTOSTOMY N/A 05/17/2016   Procedure: CYSTOSCOPY WITH  SUPRAPUBIC PLACMENT;  Surgeon: [name], MD;  Location: [facility];  Service: Urology;  Laterality: N/A;  . EYE SURGERY    . GANGLION CYST EXCISION Right 12/09/2001   right palm  . HERNIA REPAIR    . JOINT REPLACEMENT    . KNEE ARTHROSCOPY Right 12/14/2002   w/ Lysis Adhesions  . LAMINECTOMY  04/24/2020   Bilateral redo L2-3, L3-4 and L4-5 laminotomy/laminectomy/foraminotomies/medial facetectomy to decompress the bilateral L3, L4 and L5 nerve roots  . LUMBAR LAMINECTOMY/DECOMPRESSION MICRODISCECTOMY N/A 03/06/2016   Procedure: CENTRAL DECOMPRESSION L3 - L4 ,L4 - L5;  Surgeon: [name], MD;  Location: [facility];  Service: Orthopedics;  Laterality: N/A;  . SHOULDER HEMI-ARTHROPLASTY Right 02/20/2009   avascular necrosis   . TOTAL KNEE ARTHROPLASTY Bilateral right 06-23-2002/  left 07-11-2008  . [place]  . VAGINAL HYSTERECTOMY  1975   Social History   Occupational History  . Occupation: Retired  Tobacco Use  . Smoking status: Never Smoker  . Smokeless tobacco: Never Used  Vaping Use  . Vaping Use: Never used  Substance and Sexual Activity  . Alcohol use: No  . Drug use: No  . Sexual activity: Never

## 2020-10-16 DIAGNOSIS — M4316 Spondylolisthesis, lumbar region: Secondary | ICD-10-CM | POA: Diagnosis not present

## 2020-10-16 DIAGNOSIS — E1122 Type 2 diabetes mellitus with diabetic chronic kidney disease: Secondary | ICD-10-CM | POA: Diagnosis not present

## 2020-10-16 DIAGNOSIS — Z7982 Long term (current) use of aspirin: Secondary | ICD-10-CM | POA: Diagnosis not present

## 2020-10-16 DIAGNOSIS — Z7984 Long term (current) use of oral hypoglycemic drugs: Secondary | ICD-10-CM | POA: Diagnosis not present

## 2020-10-16 DIAGNOSIS — Z961 Presence of intraocular lens: Secondary | ICD-10-CM | POA: Diagnosis not present

## 2020-10-16 DIAGNOSIS — Z9842 Cataract extraction status, left eye: Secondary | ICD-10-CM | POA: Diagnosis not present

## 2020-10-16 DIAGNOSIS — K5790 Diverticulosis of intestine, part unspecified, without perforation or abscess without bleeding: Secondary | ICD-10-CM | POA: Diagnosis not present

## 2020-10-16 DIAGNOSIS — Z981 Arthrodesis status: Secondary | ICD-10-CM | POA: Diagnosis not present

## 2020-10-16 DIAGNOSIS — Z853 Personal history of malignant neoplasm of breast: Secondary | ICD-10-CM | POA: Diagnosis not present

## 2020-10-16 DIAGNOSIS — M48062 Spinal stenosis, lumbar region with neurogenic claudication: Secondary | ICD-10-CM | POA: Diagnosis not present

## 2020-10-16 DIAGNOSIS — K219 Gastro-esophageal reflux disease without esophagitis: Secondary | ICD-10-CM | POA: Diagnosis not present

## 2020-10-16 DIAGNOSIS — Z87440 Personal history of urinary (tract) infections: Secondary | ICD-10-CM | POA: Diagnosis not present

## 2020-10-16 DIAGNOSIS — E785 Hyperlipidemia, unspecified: Secondary | ICD-10-CM | POA: Diagnosis not present

## 2020-10-16 DIAGNOSIS — D631 Anemia in chronic kidney disease: Secondary | ICD-10-CM | POA: Diagnosis not present

## 2020-10-16 DIAGNOSIS — Z79891 Long term (current) use of opiate analgesic: Secondary | ICD-10-CM | POA: Diagnosis not present

## 2020-10-16 DIAGNOSIS — Z9350 Unspecified cystostomy status: Secondary | ICD-10-CM | POA: Diagnosis not present

## 2020-10-16 DIAGNOSIS — Z923 Personal history of irradiation: Secondary | ICD-10-CM | POA: Diagnosis not present

## 2020-10-16 DIAGNOSIS — M1990 Unspecified osteoarthritis, unspecified site: Secondary | ICD-10-CM | POA: Diagnosis not present

## 2020-10-16 DIAGNOSIS — N183 Chronic kidney disease, stage 3 unspecified: Secondary | ICD-10-CM | POA: Diagnosis not present

## 2020-10-16 DIAGNOSIS — I4510 Unspecified right bundle-branch block: Secondary | ICD-10-CM | POA: Diagnosis not present

## 2020-10-16 DIAGNOSIS — Z90710 Acquired absence of both cervix and uterus: Secondary | ICD-10-CM | POA: Diagnosis not present

## 2020-10-16 DIAGNOSIS — Z9181 History of falling: Secondary | ICD-10-CM | POA: Diagnosis not present

## 2020-10-16 DIAGNOSIS — Z9841 Cataract extraction status, right eye: Secondary | ICD-10-CM | POA: Diagnosis not present

## 2020-10-16 DIAGNOSIS — N319 Neuromuscular dysfunction of bladder, unspecified: Secondary | ICD-10-CM | POA: Diagnosis not present

## 2020-10-16 DIAGNOSIS — M5136 Other intervertebral disc degeneration, lumbar region: Secondary | ICD-10-CM | POA: Diagnosis not present

## 2020-10-17 ENCOUNTER — Telehealth: Payer: Self-pay | Admitting: Family Medicine

## 2020-10-17 DIAGNOSIS — M25552 Pain in left hip: Secondary | ICD-10-CM

## 2020-10-17 NOTE — Telephone Encounter (Signed)
I called and advised patient of the plan. She is okay with proceeding with seeing Dr. Ernestina Patches.

## 2020-10-17 NOTE — Telephone Encounter (Signed)
Did her pain get better for the few hours after the injection when the numbing medicine was in effect?

## 2020-10-17 NOTE — Addendum Note (Signed)
Addended by: Hortencia Pilar on: 10/17/2020 01:57 PM   Modules accepted: Orders

## 2020-10-17 NOTE — Telephone Encounter (Signed)
Pt states she got an injection in her hip and she said it is not working.She would like to discuss something else that could be done. She said she had a miserable time last night trying to sleep//couldn't be still. Please call her back [phone]. She said if she does not answer leave her a message

## 2020-10-17 NOTE — Telephone Encounter (Signed)
The patient said "no, not really" when asked if she had any relief for a few hours, from the numbing medication.

## 2020-10-17 NOTE — Telephone Encounter (Signed)
Please see the patient's response below.

## 2020-10-17 NOTE — Telephone Encounter (Signed)
Referral made to Dr. Ernestina Patches to see if injecting the SI under fluoro will work any better for her.

## 2020-10-23 ENCOUNTER — Encounter: Payer: Self-pay | Admitting: Surgery

## 2020-10-23 ENCOUNTER — Other Ambulatory Visit: Payer: Self-pay

## 2020-10-23 ENCOUNTER — Ambulatory Visit (INDEPENDENT_AMBULATORY_CARE_PROVIDER_SITE_OTHER): Payer: Medicare Other | Admitting: Surgery

## 2020-10-23 VITALS — BP 138/76 | HR 101 | Temp 98.0°F | Resp 18 | Ht 60.0 in | Wt 143.3 lb

## 2020-10-23 DIAGNOSIS — R600 Localized edema: Secondary | ICD-10-CM | POA: Diagnosis not present

## 2020-10-23 NOTE — Progress Notes (Signed)
[facility] [place]  Patient name: [name] MRN: [id number] DOB: 07-30-1929 Sex: female   REQUESTING PROVIDER:    Dr. Johney Frame   REASON FOR CONSULT:    Leg pain  HISTORY OF PRESENT ILLNESS:   [name] is a 87 y.o. female, who is referred for evaluation of bilateral leg swelling.  The patient states that this is gotten worse over the past 6 months.  The left leg is more bothersome than the right.  She has increased her Lasix however this did not help.  She has known renal insufficiency with a creatinine around 1.4.  She had a Myoview with no evidence of ischemia and echo showed good cardiac function in 2021.  She has had multiple Doppler studies which were negative for DVT.  She has been wearing TED hose without much benefit.  She states that her legs are very tender to the touch.  Patient has a history of type 2 diabetes.  She takes a statin for hypercholesterolemia.  She is a non-smoker.  She has undergone multiple orthopedic procedures  PAST MEDICAL HISTORY    Past Medical History:  Diagnosis Date  . Anemia   . Arthritis   . Bilateral edema of lower extremity   . 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 Chronic lower back pain   . CKD (chronic kidney disease) stage 3, GFR 30-59 ml/min (HCC)   . DDD (degenerative disc disease)    lumbar  . Diverticulosis   . Family history of breast cancer   . Family history of pancreatic cancer   . Family history of prostate cancer   . Flaccid neuropathic bladder, not elsewhere classified   . Foley catheter in place   . GERD (gastroesophageal reflux disease)   . Hemorrhoids   . Hiatal hernia   . History of colon polyps   . History of radiation therapy  09-12-2015 to 10-12-2015   42.72 gray in 16 fractions directed right breast w/ boost of 12 gray in 6 fractions directed at the lumpectomy cavity- Total dose: 54.72y  . History of recurrent UTIs   . Hyperlipidemia   . PONV (postoperative nausea and vomiting)   . RBBB (right bundle branch block with left anterior fascicular block)   . Type 2 diabetes mellitus ([place])   . Urinary retention with incomplete bladder emptying   . Wears dentures    full upper and lower partial  . Wears glasses   . Wears hearing aid    bilateral but does not wear     FAMILY HISTORY   Family History  Problem Relation Age of Onset  . Pancreatic cancer Mother 73  . Diabetes Mother   . Prostate cancer Brother   . Diabetes Father   . Heart disease Father   . Diabetes Brother   . Heart attack Brother   . Diabetes Sister   . Diabetes Daughter   . Diabetes Son   . Coronary artery disease Brother        MI in his 64s  . Colon cancer Neg Hx   . Stomach cancer Neg Hx     SOCIAL HISTORY:   Social History   Socioeconomic History  . Marital status: Married    Spouse name: Sera Hitsman  .  Number of children: 4  . Years of education: 62  . Highest education level: 12th grade  Occupational History  . Occupation: Retired  Tobacco Use  . Smoking status: Never Smoker  . Smokeless tobacco: Never Used  Vaping Use  . Vaping Use: Never used  Substance and Sexual Activity  . Alcohol use: No  . Drug use: No  . Sexual activity: Never  Other Topics Concern  . Not on file  Social History Narrative   HH 2 married   Works for [employer] for 40 years retired   No pets    Lives w spouse; married in 60; 19 years    Social Determinants of Health   Financial Resource Strain: Low Risk   . Difficulty of Paying Living Expenses: Not hard at all  Food Insecurity: No Food Insecurity  . Worried About Charity fundraiser in the Last Year: Never true  . Ran Out of Food in the Last Year: Never true  Transportation  Needs: No Transportation Needs  . Lack of Transportation (Medical): No  . Lack of Transportation (Non-Medical): No  Physical Activity: Inactive  . Days of Exercise per Week: 0 days  . Minutes of Exercise per Session: 0 min  Stress: No Stress Concern Present  . Feeling of Stress : Only a little  Social Connections: Unknown  . Frequency of Communication with Friends and Family: More than three times a week  . Frequency of Social Gatherings with Friends and Family: More than three times a week  . Attends Religious Services: Not on file  . Active Member of Clubs or Organizations: No  . Attends Archivist Meetings: Never  . Marital Status: Married  Human resources officer Violence: Not At Risk  . Fear of Current or Ex-Partner: No  . Emotionally Abused: No  . Physically Abused: No  . Sexually Abused: No    ALLERGIES:    Allergies  Allergen Reactions  . Phenergan [Promethazine]     Slurred speech, acted very confused  . Remeron [Mirtazapine] Itching    CURRENT MEDICATIONS:    Current Outpatient Medications  Medication Sig Dispense Refill  . acetaminophen (TYLENOL) 500 MG tablet Take 500 mg by mouth every 6 (six) hours as needed for mild pain or moderate pain.     Marland Kitchen aspirin EC 81 MG tablet Take 81 mg by mouth daily at 12 noon.     Marland Kitchen atorvastatin (LIPITOR) 10 MG tablet Take 1 tablet (10 mg total) by mouth daily. 90 tablet 1  . Cholecalciferol (VITAMIN D) 50 MCG (2000 UT) tablet Take 2,000 Units by mouth daily.    Marland Kitchen docusate sodium (COLACE) 100 MG capsule Take 1 capsule (100 mg total) by mouth 2 (two) times daily. 60 capsule 0  . esomeprazole (NEXIUM) 40 MG capsule Take 1 capsule (40 mg total) by mouth daily. 90 capsule 1  . furosemide (LASIX) 40 MG tablet Take 1 tablet (40 mg total) by mouth daily. Or as directed (Patient taking differently: Take 60 mg by mouth daily.) 90 tablet 1  . glipiZIDE (GLUCOTROL) 10 MG tablet Take 1 tablet (10 mg total) by mouth in the morning. 90  tablet 3  . LORazepam (ATIVAN) 0.5 MG tablet TAKE 1/2 TO 1 TABLET BY MOUTH EVERY NIGHT AT BEDTIME AS NEEDED FOR ANXIETY OR SLEEP. DO NOT TAKE WITH NARCOTICS OR AMBIEN 14 tablet 0  . naloxegol oxalate (MOVANTIK) 25 MG TABS tablet Take 1 tablet (25 mg total) by mouth daily. 30 tablet 0  .  senna-docusate (SENOKOT-S) 8.6-50 MG tablet Take 2 tablets by mouth 2 (two) times daily. 120 tablet 2  . traMADol (ULTRAM) 50 MG tablet Take by mouth daily.    . Turmeric 500 MG CAPS Take by mouth daily.    . cyanocobalamin 1000 MCG tablet Take 1,000 mcg by mouth at bedtime.     . polyethylene glycol (MIRALAX / GLYCOLAX) 17 g packet Take 17 g by mouth daily as needed for moderate constipation or severe constipation. (Patient not taking: Reported on 10/23/2020) 14 each 0   No current facility-administered medications for this visit.    REVIEW OF SYSTEMS:   [X]  denotes positive finding, [ ]  denotes negative finding Cardiac  Comments:  Chest pain or chest pressure:    Shortness of breath upon exertion:    Short of breath when lying flat:    Irregular heart rhythm:        Vascular    Pain in calf, thigh, or hip brought on by ambulation:    Pain in feet at night that wakes you up from your sleep:     Blood clot in your veins:    Leg swelling:  x       Pulmonary    Oxygen at home:    Productive cough:     Wheezing:         Neurologic    Sudden weakness in arms or legs:     Sudden numbness in arms or legs:     Sudden onset of difficulty speaking or slurred speech:    Temporary loss of vision in one eye:     Problems with dizziness:         Gastrointestinal    Blood in stool:      Vomited blood:         Genitourinary    Burning when urinating:     Blood in urine:        Psychiatric    Major depression:         Hematologic    Bleeding problems:    Problems with blood clotting too easily:        Skin    Rashes or ulcers:        Constitutional    Fever or chills:     PHYSICAL EXAM:    Vitals:   10/23/20 1334  BP: 138/76  Pulse: (!) 101  Resp: 18  Temp: 98 F (36.7 C)  TempSrc: Temporal  SpO2: 97%  Weight: 143 lb 4.8 oz (65 kg)  Height: 5' (1.524 m)    GENERAL: The patient is a well-nourished female, in no acute distress. The vital signs are documented above. CARDIAC: There is a regular rate and rhythm.  VASCULAR: Faintly palpable pedal pulses, bilateral lower extremity edema PULMONARY: Nonlabored respirations MUSCULOSKELETAL: There are no major deformities or cyanosis. NEUROLOGIC: No focal weakness or paresthesias are detected. SKIN: There are no ulcers or rashes noted. PSYCHIATRIC: The patient has a normal affect.  STUDIES:   I have reviewed the following:  ABI: Right: Resting right ankle-brachial index is within normal range. No  evidence of significant right lower extremity arterial disease. The right  toe-brachial index is abnormal.   Left: Resting left ankle-brachial index is within normal range. No  evidence of significant left lower extremity arterial disease. The left  toe-brachial index is normal.   DVT: RIGHT:  - There is no evidence of deep vein thrombosis in the lower extremity.    - No cystic structure  found in the popliteal fossa.    LEFT:  - There is no evidence of deep vein thrombosis in the lower extremity.   ASSESSMENT and PLAN   Bilateral lower extremity edema, left greater than right: I discussed with the patient and her daughter, that this most likely is lymphedema.  She has not had a formal venous reflux study.  Currently she is wearing TED hose.  I have recommended that we switch her to 20-30 compression socks.  I have educated them on how to obtain these.  I would like for her to wear the socks beginning in the morning, and take them off at night.  I also told her to keep her legs elevated.  She is going to come back in 3 months for a follow-up.  At that time I will get a venous reflux study of the left leg.  Further  recommendations will be made based off of the reflux study.  I suspect that her best option is going to be elevation and compression   Annamarie Major, IV, MD, FACS [facility] [phone] Pager [phone]

## 2020-10-24 ENCOUNTER — Other Ambulatory Visit: Payer: Self-pay

## 2020-10-25 DIAGNOSIS — M48062 Spinal stenosis, lumbar region with neurogenic claudication: Secondary | ICD-10-CM | POA: Diagnosis not present

## 2020-10-25 DIAGNOSIS — D631 Anemia in chronic kidney disease: Secondary | ICD-10-CM | POA: Diagnosis not present

## 2020-10-25 DIAGNOSIS — E1122 Type 2 diabetes mellitus with diabetic chronic kidney disease: Secondary | ICD-10-CM | POA: Diagnosis not present

## 2020-10-25 DIAGNOSIS — M4316 Spondylolisthesis, lumbar region: Secondary | ICD-10-CM | POA: Diagnosis not present

## 2020-10-25 DIAGNOSIS — M5136 Other intervertebral disc degeneration, lumbar region: Secondary | ICD-10-CM | POA: Diagnosis not present

## 2020-10-25 DIAGNOSIS — N183 Chronic kidney disease, stage 3 unspecified: Secondary | ICD-10-CM | POA: Diagnosis not present

## 2020-10-26 ENCOUNTER — Ambulatory Visit (INDEPENDENT_AMBULATORY_CARE_PROVIDER_SITE_OTHER): Payer: Medicare Other

## 2020-10-26 DIAGNOSIS — E1169 Type 2 diabetes mellitus with other specified complication: Secondary | ICD-10-CM

## 2020-10-26 DIAGNOSIS — M1612 Unilateral primary osteoarthritis, left hip: Secondary | ICD-10-CM | POA: Diagnosis not present

## 2020-10-26 DIAGNOSIS — R253 Fasciculation: Secondary | ICD-10-CM

## 2020-10-26 DIAGNOSIS — R251 Tremor, unspecified: Secondary | ICD-10-CM

## 2020-10-26 DIAGNOSIS — M25552 Pain in left hip: Secondary | ICD-10-CM

## 2020-10-26 NOTE — Patient Instructions (Signed)
Visit Information  PATIENT GOALS: Goals Addressed            This Visit's Progress   . Cope with Chronic Pain   On track    Timeframe:  Long-Range Goal Priority:  High Start Date:      09/28/20                       Expected End Date:           01/05/21            Follow Up Date 11/30/20    - learn relaxation techniques - practice acceptance of chronic pain - practice relaxation or meditation daily - think of new ways to do favorite things - use distraction techniques - use relaxation during pain    Why is this important?    Stress makes chronic pain feel worse.   Feelings like depression, anxiety, stress and anger can make your body more sensitive to pain.   Learning ways to cope with stress or depression may help you find some relief from the pain.     Notes:     Marland Kitchen Monitor and Manage My Blood Sugar-Diabetes Type 2   On track    Timeframe:  Long-Range Goal Priority:  Medium Start Date:      09/28/20                       Expected End Date:        01/05/21               Follow Up Date 11/30/2020    - check blood sugar at prescribed times - check blood sugar if I feel it is too high or too low - enter blood sugar readings and medication or insulin into daily log - take the blood sugar log to all doctor visits    Why is this important?    Checking your blood sugar at home helps to keep it from getting very high or very low.   Writing the results in a diary or log helps the doctor know how to care for you.   Your blood sugar log should have the time, date and the results.   Also, write down the amount of insulin or other medicine that you take.   Other information, like what you ate, exercise done and how you were feeling, will also be helpful.     Notes:        Insomnia Insomnia is a sleep disorder that makes it difficult to fall asleep or stay asleep. Insomnia can cause fatigue, low energy, difficulty concentrating, mood swings, and poor performance at work or  school. There are three different ways to classify insomnia:  Difficulty falling asleep.  Difficulty staying asleep.  Waking up too early in the morning. Any type of insomnia can be long-term (chronic) or short-term (acute). Both are common. Short-term insomnia usually lasts for three months or less. Chronic insomnia occurs at least three times a week for longer than three months. What are the causes? Insomnia may be caused by another condition, situation, or substance, such as:  Anxiety.  Certain medicines.  Gastroesophageal reflux disease (GERD) or other gastrointestinal conditions.  Asthma or other breathing conditions.  Restless legs syndrome, sleep apnea, or other sleep disorders.  Chronic pain.  Menopause.  Stroke.  Abuse of alcohol, tobacco, or illegal drugs.  Mental health conditions, such as depression.  Caffeine.  Neurological  disorders, such as Alzheimer's disease.  An overactive thyroid (hyperthyroidism). Sometimes, the cause of insomnia may not be known. What increases the risk? Risk factors for insomnia include:  Gender. Women are affected more often than men.  Age. Insomnia is more common as you get older.  Stress.  Lack of exercise.  Irregular work schedule or working night shifts.  Traveling between different time zones.  Certain medical and mental health conditions. What are the signs or symptoms? If you have insomnia, the main symptom is having trouble falling asleep or having trouble staying asleep. This may lead to other symptoms, such as:  Feeling fatigued or having low energy.  Feeling nervous about going to sleep.  Not feeling rested in the morning.  Having trouble concentrating.  Feeling irritable, anxious, or depressed. How is this diagnosed? This condition may be diagnosed based on:  Your symptoms and medical history. Your health care provider may ask about: ? Your sleep habits. ? Any medical conditions you  have. ? Your mental health.  A physical exam. How is this treated? Treatment for insomnia depends on the cause. Treatment may focus on treating an underlying condition that is causing insomnia. Treatment may also include:  Medicines to help you sleep.  Counseling or therapy.  Lifestyle adjustments to help you sleep better. Follow these instructions at home: Eating and drinking  Limit or avoid alcohol, caffeinated beverages, and cigarettes, especially close to bedtime. These can disrupt your sleep.  Do not eat a large meal or eat spicy foods right before bedtime. This can lead to digestive discomfort that can make it hard for you to sleep.   Sleep habits  Keep a sleep diary to help you and your health care provider figure out what could be causing your insomnia. Write down: ? When you sleep. ? When you wake up during the night. ? How well you sleep. ? How rested you feel the next day. ? Any side effects of medicines you are taking. ? What you eat and drink.  Make your bedroom a dark, comfortable place where it is easy to fall asleep. ? Put up shades or blackout curtains to block light from outside. ? Use a white noise machine to block noise. ? Keep the temperature cool.  Limit screen use before bedtime. This includes: ? Watching TV. ? Using your smartphone, tablet, or computer.  Stick to a routine that includes going to bed and waking up at the same times every day and night. This can help you fall asleep faster. Consider making a quiet activity, such as reading, part of your nighttime routine.  Try to avoid taking naps during the day so that you sleep better at night.  Get out of bed if you are still awake after 15 minutes of trying to sleep. Keep the lights down, but try reading or doing a quiet activity. When you feel sleepy, go back to bed.   General instructions  Take over-the-counter and prescription medicines only as told by your health care provider.  Exercise  regularly, as told by your health care provider. Avoid exercise starting several hours before bedtime.  Use relaxation techniques to manage stress. Ask your health care provider to suggest some techniques that may work well for you. These may include: ? Breathing exercises. ? Routines to release muscle tension. ? Visualizing peaceful scenes.  Make sure that you drive carefully. Avoid driving if you feel very sleepy.  Keep all follow-up visits as told by your health care provider. This  is important. Contact a health care provider if:  You are tired throughout the day.  You have trouble in your daily routine due to sleepiness.  You continue to have sleep problems, or your sleep problems get worse. Get help right away if:  You have serious thoughts about hurting yourself or someone else. If you ever feel like you may hurt yourself or others, or have thoughts about taking your own life, get help right away. You can go to your nearest emergency department or call:  Your local emergency services (911 in the [place]  A suicide crisis helpline, such as the [facility] at (262)302-6412. This is open 24 hours a day. Summary  Insomnia is a sleep disorder that makes it difficult to fall asleep or stay asleep.  Insomnia can be long-term (chronic) or short-term (acute).  Treatment for insomnia depends on the cause. Treatment may focus on treating an underlying condition that is causing insomnia.  Keep a sleep diary to help you and your health care provider figure out what could be causing your insomnia. This information is not intended to replace advice given to you by your health care provider. Make sure you discuss any questions you have with your health care provider. Document Revised: 05/04/2020 Document Reviewed: 05/04/2020 Elsevier Patient Education  2021 [employer].   Patient verbalizes understanding of instructions provided today and agrees to view in  [place].   Telephone follow up appointment with care management team member scheduled for: 11/30/20 at 10:45 AM  Peter Garter RN, Jackquline Denmark, CDE Care Management Coordinator [facility] [phone], Mobile [phone]

## 2020-10-26 NOTE — Chronic Care Management (AMB) (Signed)
Chronic Care Management   CCM RN Visit Note  10/26/2020 Name: [name] MRN: [id number] DOB: 1930-03-04  Subjective: [name] is a 87 y.o. year old female who is a primary care patient of Panosh, Standley Brooking, MD. The care management team was consulted for assistance with disease management and care coordination needs.    Engaged with patient by telephone for follow up visit in response to provider referral for case management and/or care coordination services.   Consent to Services:  The patient was given information about Chronic Care Management services, agreed to services, and gave verbal consent prior to initiation of services.  Please see initial visit note for detailed documentation.   Patient agreed to services and verbal consent obtained.   Assessment: Review of patient past medical history, allergies, medications, health status, including review of consultants reports, laboratory and other test data, was performed as part of comprehensive evaluation and provision of chronic care management services.   SDOH (Social Determinants of Health) assessments and interventions performed:    CCM Care Plan  Allergies  Allergen Reactions  . Phenergan [Promethazine]     Slurred speech, acted very confused  . Remeron [Mirtazapine] Itching    Outpatient Encounter Medications as of 10/26/2020  Medication Sig Note  . acetaminophen (TYLENOL) 500 MG tablet Take 500 mg by mouth every 6 (six) hours as needed for mild pain or moderate pain.    Marland Kitchen aspirin EC 81 MG tablet Take 81 mg by mouth daily at 12 noon.    Marland Kitchen atorvastatin (LIPITOR) 10 MG tablet Take 1 tablet (10 mg total) by mouth daily.   . Cholecalciferol (VITAMIN D) 50 MCG (2000 UT) tablet Take 2,000 Units by mouth daily.   . cyanocobalamin 1000 MCG tablet Take 1,000 mcg by mouth at bedtime.    . docusate sodium (COLACE) 100 MG capsule Take 1 capsule (100 mg total) by mouth 2 (two) times daily.   Marland Kitchen esomeprazole (NEXIUM) 40 MG  capsule Take 1 capsule (40 mg total) by mouth daily.   . furosemide (LASIX) 40 MG tablet Take 1 tablet (40 mg total) by mouth daily. Or as directed (Patient taking differently: Take 60 mg by mouth daily.) 09/15/2020: 1 daily   . glipiZIDE (GLUCOTROL) 10 MG tablet Take 1 tablet (10 mg total) by mouth in the morning.   Marland Kitchen LORazepam (ATIVAN) 0.5 MG tablet TAKE 1/2 TO 1 TABLET BY MOUTH EVERY NIGHT AT BEDTIME AS NEEDED FOR ANXIETY OR SLEEP. DO NOT TAKE WITH NARCOTICS OR AMBIEN   . naloxegol oxalate (MOVANTIK) 25 MG TABS tablet Take 1 tablet (25 mg total) by mouth daily.   . polyethylene glycol (MIRALAX / GLYCOLAX) 17 g packet Take 17 g by mouth daily as needed for moderate constipation or severe constipation. (Patient not taking: Reported on 10/23/2020)   . senna-docusate (SENOKOT-S) 8.6-50 MG tablet Take 2 tablets by mouth 2 (two) times daily.   . traMADol (ULTRAM) 50 MG tablet Take by mouth daily.   . Turmeric 500 MG CAPS Take by mouth daily.    No facility-administered encounter medications on file as of 10/26/2020.    Patient Active Problem List   Diagnosis Date Noted  . Peripheral edema 07/11/2020  . Sleep disturbance 06/12/2020  . Drug induced constipation   . Neurogenic bladder   . Constipation   . Hypoalbuminemia due to protein-calorie malnutrition ([place])   . Benign essential HTN   . Stage 3b chronic kidney disease ([place])   . Chronic pain syndrome   .  Controlled type 2 diabetes mellitus with hyperglycemia, without long-term current use of insulin ([place])   . Lumbar radiculopathy 05/02/2020  . Spondylolisthesis of lumbar region 04/24/2020  . Swelling of left lower extremity 02/18/2020  . Urinary retention with incomplete bladder emptying   . Hip pain   . Severe back pain   . AKI (acute kidney injury) ([place])   . Intractable pain/ left hip osteoarthritis 12/25/2019  . Spinal stenosis, lumbar region, with neurogenic claudication 03/06/2016  . Malignant neoplasm of right female breast ([place])  01/11/2016  . Controlled type 2 diabetes mellitus without complication, without long-term current use of insulin ([place]) 01/11/2016  . Genetic testing 08/18/2015  . Family history of breast cancer   . Family history of prostate cancer   . Family history of pancreatic cancer   . Breast cancer of upper-outer quadrant of right female breast ([place]) 07/21/2015  . Insomnia 05/06/2015  . Medication management 12/16/2013  . Hand arthritis 12/16/2013  . Rectal bleeding 12/16/2013  . Cold intolerance 06/17/2013  . Renal insufficiency 12/23/2012  . Post-menopause 12/23/2012  . Hyperlipidemia 12/23/2012  . Postmenopausal HRT (hormone replacement therapy) 12/23/2012  . History of shingles 12/23/2012  . Wears hearing aid 12/23/2012  . Nonspecific abnormal unspecified cardiovascular function study 02/28/2012  . Chronic sore throat 01/13/2012  . Hoarse 01/13/2012  . Chest pressure 12/03/2011  . Itching  mid back 12/03/2011  . Abnormal EKG 12/03/2011  . Medicare annual wellness visit, subsequent 12/03/2011  . DJD (degenerative joint disease) 12/03/2011  . DIABETES MELLITUS, TYPE II, CONTROLLED 11/15/2009  . VITAMIN D DEFICIENCY 11/15/2009  . EXOGENOUS OBESITY 11/15/2009  . ANXIETY DEPRESSION 12/22/2008  . HYPERTHYROIDISM 08/23/2008  . TOTAL KNEE REPLACEMENT, LEFT, HX OF 08/23/2008  . Hypothyroidism 10/08/2007  . ANEMIA 10/08/2007  . Osteoarthritis 10/08/2007  . OSTEOPENIA 10/08/2007  . Dyslipidemia 11/07/2006  . GERD (gastroesophageal reflux disease) 11/07/2006    Conditions to be addressed/monitored:DMII and chronic pain  Care Plan : RNCM:Diabetes Type 2 (Adult)  Updates made by Dimitri Ped, RN since 10/26/2020 12:00 AM    Problem: Glycemic Management (Diabetes, Type 2)   Priority: Medium    Long-Range Goal: Glycemic Management Optimized   Start Date: 09/28/2020  Expected End Date: 01/05/2021  This Visit's Progress: On track  Recent Progress: On track  Priority: Medium  Note:    Objective:  Lab Results  Component Value Date   HGBA1C 6.9 (H) 08/14/2020 .   Lab Results  Component Value Date   CREATININE 1.47 (H) 08/14/2020   CREATININE 1.25 (H) 06/14/2020   CREATININE 1.15 (H) 05/31/2020 .   Lab Results  Component Value Date   EGFR 32 (L) 07/26/2015 .   Current Barriers:  Marland Kitchen Knowledge Deficits related to basic Diabetes pathophysiology and self care/management . Knowledge Deficits related to medications used for management of diabetes . Unable to independently daughter assists with most IADLS, impaired mobility  . Does not adhere to provider recommendations re: exercise, diet . Unable to perform IADLs independently . Reports CBGs range from 114-130 in the morning. Denies any hypoglycemia. States she is eating healthy and trying to follow a low CHO, low sodium diet States she saw vascular doctor who gave her stronger compression hose to wear and she is to keep her legs elevated more.  States her husband will be helping her put on the hose as he wears compression hose also. Case Manager Clinical Goal(s):  . patient will demonstrate improved adherence to prescribed treatment plan for diabetes self  care/management as evidenced by: daily monitoring and recording of CBG  adherence to ADA/ carb modified diet adherence to prescribed medication regimen contacting provider for new or worsened symptoms or questions Interventions:  . Collaboration with Panosh, Standley Brooking, MD regarding development and update of comprehensive plan of care as evidenced by provider attestation and co-signature . Inter-disciplinary care team collaboration (see longitudinal plan of care) . Reinforced education to patient about basic DM disease process . Reviewed medications with patient and discussed importance of medication adherence . Discussed plans with patient for ongoing care management follow up and provided patient with direct contact information for care management team . Provided patient  with written educational materials related to hypo and hyperglycemia and importance of correct treatment . Reviewed scheduled/upcoming provider appointments including: no upcoming Primary care provider, Vascular 01/23/21  . Reinforced to check cbg daily and record, calling primary care provider for findings outside established parameters.   . Review of patient status, including review of consultants reports, relevant laboratory and other test results, and medications completed. . Reviewed instructions to wear compression hose, to put on in the morning and remove at night, and to keep her legs elevated as much as possible Self-Care Activities - Self administers oral medications as prescribed Attends all scheduled provider appointments Checks blood sugars as prescribed and utilize hyper and hypoglycemia protocol as needed Adheres to prescribed ADA/carb modified Patient Goals: - check blood sugar at prescribed times - check blood sugar if I feel it is too high or too low - enter blood sugar readings and medication or insulin into daily log - take the blood sugar log to all doctor visits - drink 6 to 8 glasses of water each day - fill half of plate with vegetables - limit fast food meals to no more than 1 per week - read food labels for fat, fiber, carbohydrates and portion size - keep appointment with eye doctor - check feet daily for cuts, sores or redness - keep feet up while sitting - wear comfortable, well-fitting shoes - check blood sugar at prescribed times - check blood sugar if I feel it is too high or too low - enter blood sugar readings and medication or insulin into daily log - take the blood sugar log to all doctor visits Follow Up Plan: Telephone follow up appointment with care management team member scheduled for: 11/30/20 at 10:45 AM   Care Plan : RNCM:Chronic Pain (Adult)  Updates made by Dimitri Ped, RN since 10/26/2020 12:00 AM    Problem: Chronic Pain Management  (Chronic Pain)   Priority: High    Long-Range Goal: Chronic Pain Managed   Start Date: 09/28/2020  Expected End Date: 01/05/2021  This Visit's Progress: On track  Recent Progress: On track  Priority: High  Note:   Current Barriers:  Marland Kitchen Knowledge Deficits related to self-health management of chronic pain- lt hip osteoarthritis, degenerative disc disease.  Pt had back surgery 05/02/21, reports still weak and having issues with lt hip and leg pain, Currently getting home health PT, uses walker at all times, reports taking Tramadol at bedtime and Tylenol during the day for pain relief, Daughter assists pt with transportation and her care as needed.  Husband assists if needed and does some cooking. 10/26/20 States that she saw orthopedic doctor who gave her a shot in her hip  which as not helped with her hip pain.  States she is to see another doctor to get a different shot for pain.  States  she is still having tremors and would like to see a neurologist.  States she is not sleeping very much due to her pain and the lorazepam has not been helping . Chronic Disease Management support and education needs related to chronic pain . Knowledge Deficits related to self management of chronic pain . Chronic Disease Management support and education needs related to self management of chronic pain . Unable to independently self manage chronic pain . Unable to perform IADLs independently Clinical Goal(s):  . patient will verbalize understanding of plan for pain management. , patient will attend all scheduled medical appointments: no primary care provider scheduled, vascular 10/23/20 , patient will demonstrate use of different relaxation  skills and/or diversional activities to assist with pain reduction (distraction, imagery, relaxation, massage, acupressure, TENS, heat, and cold application., patient will report pain at a level less than 3 to 4 on a 10-10 rating scale., patient will use pharmacological and  nonpharmacological pain relief strategies as prescribed. , and patient will verbalize acceptable level of pain relief and ability to engage in desired activities Interventions:  . Collaboration with Panosh, Standley Brooking, MD regarding development and update of comprehensive plan of care as evidenced by provider attestation and co-signature-Pt would like to see neurologist for her tremors, she is still having tremors and sleep issues that lorazepam does not help, she also would like a new RX for Movantik sent to her pharmacy . Pain assessment performed . Medications reviewed . Discussed plans with patient for ongoing care management follow up and provided patient with direct contact information for care management team . Evaluation of current treatment plan related to self management of chronic pain and patient's adherence to plan as established by provider. . Reinforced education to patient re: self management of chronic pain . Reviewed medications with patient and discussed use of pain medications . Reviewed scheduled/upcoming provider appointments including: Dr. Ernestina Patches 11/02/20 . Discussed plans with patient for ongoing care management follow up and provided patient with direct contact information for care management team Patient Goals/Self Care Activities:  . Will self-administer medications as prescribed . Will attend all scheduled provider appointments . Will call pharmacy for medication refills 7 days prior to needed refill date . Patient will calls provider office for new concerns or questions . - learn relaxation techniques . - practice acceptance of chronic pain . - practice relaxation or meditation daily . - think of new ways to do favorite things . - use distraction techniques . - use relaxation during pain Follow Up Plan: Telephone follow up appointment with care management team member scheduled for: 11/30/20 at 10:45 AM       Plan:Telephone follow up appointment with care management  team member scheduled for:  11/30/20 and The patient has been provided with contact information for the care management team and has been advised to call with any health related questions or concerns.  Peter Garter RN, Jackquline Denmark, CDE Care Management Coordinator [facility] [phone], [place] [phone]

## 2020-10-27 ENCOUNTER — Other Ambulatory Visit: Payer: Self-pay

## 2020-10-27 DIAGNOSIS — R600 Localized edema: Secondary | ICD-10-CM

## 2020-10-30 ENCOUNTER — Telehealth: Payer: Self-pay

## 2020-10-30 DIAGNOSIS — N183 Chronic kidney disease, stage 3 unspecified: Secondary | ICD-10-CM | POA: Diagnosis not present

## 2020-10-30 DIAGNOSIS — M4316 Spondylolisthesis, lumbar region: Secondary | ICD-10-CM | POA: Diagnosis not present

## 2020-10-30 DIAGNOSIS — M48062 Spinal stenosis, lumbar region with neurogenic claudication: Secondary | ICD-10-CM | POA: Diagnosis not present

## 2020-10-30 DIAGNOSIS — E1122 Type 2 diabetes mellitus with diabetic chronic kidney disease: Secondary | ICD-10-CM | POA: Diagnosis not present

## 2020-10-30 DIAGNOSIS — M5136 Other intervertebral disc degeneration, lumbar region: Secondary | ICD-10-CM | POA: Diagnosis not present

## 2020-10-30 DIAGNOSIS — D631 Anemia in chronic kidney disease: Secondary | ICD-10-CM | POA: Diagnosis not present

## 2020-10-30 NOTE — Addendum Note (Signed)
Addended by: Nilda Riggs on: 10/30/2020 01:51 PM   Modules accepted: Orders

## 2020-10-30 NOTE — Telephone Encounter (Signed)
Referral to neurology has been placed.

## 2020-10-30 NOTE — Progress Notes (Signed)
Please place neurology referral   Dx  Tremor and jerking ( see feb 2022 note and   CCM notes )

## 2020-10-30 NOTE — Telephone Encounter (Signed)
-----   Message from Burnis Medin, MD sent at 10/30/2020  1:39 PM EDT ----- NOt sure  you got this but place neurology referral  Thanks [facility] ----- Message ----- From: Dimitri Ped, RN Sent: 10/26/2020  11:53 AM EDT To: Burnis Medin, MD  Dr. Regis Bill, Mrs. Geis would like to see neurologist for her tremors, she is still having tremors and sleep issues that lorazepam does not help, she also would like a new RX for Movantik sent to her pharmacy Thanks  Peter Garter RN, [facility], CDE Care Management Coordinator [place] [phone], [place] [phone]

## 2020-11-01 NOTE — Progress Notes (Signed)
Assessment/Plan:   1.  Abnormal movements, suspect possible myoclonus  -Patient is describing possible myoclonus.   Her renal function has gotten worse over the last few months and that could explain this.  I didn't see any abnormal movements on examination today, including myoclonus or asterixis or tremor.  I told her I would go ahead and do an EEG to ensure that this was not seizure.  Hopefully, we will be able to capture an event.  If that is negative, then I do not see a need to do further work-up.  I think her renal function needs to be optimized.  2.  Chronic pain syndrome  -This really is her biggest complaint and she states that this is limiting her more than anything.  Suggested to the patient and her daughter that she may need to seek pain management assistance, but will leave that to the discretion of her primary care physician. Subjective:   [name] was seen today in neurologic consultation at the request of Panosh, Standley Brooking, MD.  The consultation is for the evaluation of spells of jerking and tremor.  Limited records are available are reviewed in regards to this diagnosis.  This patient is accompanied in the office by her child who supplements the history.  Pt states that she had back sx in Oct/nov and she was released from the hospital and she d/c her oxycodone and tramadol.  Its usually at night or in the middle of the night.  The jerking/shaking is not necessarily associated with pain.  It will happen until she takes a tramadol.  It will involve the entire body.   Jerking involves the arms and leg - "the entire body."  It happens sometimes in the day but most often in the evening.  Getting up and moving helps some.  Meds that may exacerbate issue:  n/a  Neuroimaging has not previously been performed.     ALLERGIES:   Allergies  Allergen Reactions  . Phenergan [Promethazine]     Slurred speech, acted very confused  . Remeron [Mirtazapine] Itching    CURRENT  MEDICATIONS:  Outpatient Encounter Medications as of 11/03/2020  Medication Sig  . acetaminophen (TYLENOL) 500 MG tablet Take 500 mg by mouth every 6 (six) hours as needed for mild pain or moderate pain.   Marland Kitchen aspirin EC 81 MG tablet Take 81 mg by mouth daily at 12 noon.   Marland Kitchen atorvastatin (LIPITOR) 10 MG tablet Take 1 tablet (10 mg total) by mouth daily.  . Cholecalciferol (VITAMIN D) 50 MCG (2000 UT) tablet Take 2,000 Units by mouth daily.  . cyanocobalamin 1000 MCG tablet Take 1,000 mcg by mouth at bedtime.   . docusate sodium (COLACE) 100 MG capsule Take 1 capsule (100 mg total) by mouth 2 (two) times daily.  Marland Kitchen esomeprazole (NEXIUM) 40 MG capsule Take 1 capsule (40 mg total) by mouth daily.  . furosemide (LASIX) 40 MG tablet Take 1 tablet (40 mg total) by mouth daily. Or as directed (Patient taking differently: Take 40 mg by mouth daily.)  . glipiZIDE (GLUCOTROL) 10 MG tablet Take 1 tablet (10 mg total) by mouth in the morning.  Marland Kitchen LORazepam (ATIVAN) 0.5 MG tablet TAKE 1/2 TO 1 TABLET BY MOUTH EVERY NIGHT AT BEDTIME AS NEEDED FOR ANXIETY OR SLEEP. DO NOT TAKE WITH NARCOTICS OR AMBIEN  . naloxegol oxalate (MOVANTIK) 25 MG TABS tablet Take 1 tablet (25 mg total) by mouth daily.  Marland Kitchen senna-docusate (SENOKOT-S) 8.6-50 MG tablet Take 2  tablets by mouth 2 (two) times daily.  . traMADol (ULTRAM) 50 MG tablet Take by mouth daily.  . Trospium Chloride 60 MG CP24 Take 1 capsule by mouth daily.  . Turmeric 500 MG CAPS Take by mouth daily.  . [DISCONTINUED] polyethylene glycol (MIRALAX / GLYCOLAX) 17 g packet Take 17 g by mouth daily as needed for moderate constipation or severe constipation. (Patient not taking: Reported on 11/02/2020)   No facility-administered encounter medications on file as of 11/03/2020.    Objective:   PHYSICAL EXAMINATION:    VITALS:   Vitals:   11/03/20 1326  BP: (!) 134/56  Pulse: (!) 107  SpO2: 95%  Weight: 142 lb (64.4 kg)  Height: 5' (1.524 m)    GEN:  Normal appears  female in no acute distress.  Appears stated age. HEENT:  Normocephalic, atraumatic. The mucous membranes are moist. The superficial temporal arteries are without ropiness or tenderness. Cardiovascular: Tachycardic.  Regular. Lungs: Clear to auscultation bilaterally. Neck/Heme: There are no carotid bruits noted bilaterally.  NEUROLOGICAL: Orientation:  The patient is alert and oriented x 3.   Cranial nerves: There is good facial symmetry.  Extraocular muscles are intact and visual fields are full to confrontational testing. Speech is fluent and clear. Soft palate rises symmetrically and there is no tongue deviation. Hearing is intact to conversational tone. Tone: Tone is good throughout. Sensation: Sensation is intact to light touch and pinprick throughout (facial, trunk, extremities). Vibration is intact at the bilateral big ankle. There is no extinction with double simultaneous stimulation. There is no sensory dermatomal level identified. Coordination:  The patient has no difficulty with RAM's or FNF bilaterally. Motor: Strength is 5/5 in the bilateral upper and lower extremities.  Shoulder shrug is equal and symmetric. There is no pronator drift.  There are no fasciculations noted. DTR's: Deep tendon reflexes are 1/4 at the bilateral biceps, triceps, brachioradialis, patella and absent at the bilateral achilles.  Plantar responses are downgoing bilaterally. Gait and Station: The patient pushes off of the transport chair to arise.  She is given a walker.  She has an indwelling Foley (suprapubic cath) that moves with [place] as she walks.  She is slow but does not shuffle. Abnormal movements: She has no tremor either at rest, with intention or postural tremor.  She has no asterixis.  No myoclonus, including spinal myoclonus.  I have reviewed and interpreted the following labs independently   Chemistry      Component Value Date/Time   NA 140 08/14/2020 1530   NA 131 (A) 01/04/2020 0000   NA 141  07/26/2015 1224   K 4.4 08/14/2020 1530   K 4.1 07/26/2015 1224   CL 98 08/14/2020 1530   CO2 36 (H) 08/14/2020 1530   CO2 29 07/26/2015 1224   BUN 26 (H) 08/14/2020 1530   BUN 37 (A) 01/04/2020 0000   BUN 32.3 (H) 07/26/2015 1224   CREATININE 1.47 (H) 08/14/2020 1530   CREATININE 1.25 (H) 06/14/2020 1359   CREATININE 1.5 (H) 07/26/2015 1224   GLU 97 01/04/2020 0000      Component Value Date/Time   CALCIUM 9.9 08/14/2020 1530   CALCIUM 9.8 07/26/2015 1224   ALKPHOS 107 08/14/2020 1530   ALKPHOS 90 07/26/2015 1224   AST 12 08/14/2020 1530   AST 14 07/26/2015 1224   ALT 8 08/14/2020 1530   ALT 13 07/26/2015 1224   BILITOT 0.3 08/14/2020 1530   BILITOT 0.55 07/26/2015 1224       Total  time spent on today's visit was 45 minutes, including both face-to-face time and nonface-to-face time.  Time included that spent on review of records (prior notes available to me/labs/imaging if pertinent), discussing treatment and goals, answering patient's questions and coordinating care.   Cc:  Panosh, Standley Brooking, MD

## 2020-11-02 ENCOUNTER — Ambulatory Visit: Payer: Self-pay

## 2020-11-02 ENCOUNTER — Encounter: Payer: Self-pay | Admitting: Physical Medicine and Rehabilitation

## 2020-11-02 ENCOUNTER — Ambulatory Visit (INDEPENDENT_AMBULATORY_CARE_PROVIDER_SITE_OTHER): Payer: Medicare Other | Admitting: Physical Medicine and Rehabilitation

## 2020-11-02 VITALS — BP 130/71 | HR 108

## 2020-11-02 DIAGNOSIS — M461 Sacroiliitis, not elsewhere classified: Secondary | ICD-10-CM

## 2020-11-02 NOTE — Progress Notes (Signed)
Pt state left hip pain. Pt state the pain hurts all the time. Pt state she take pain meds to help ease her pain.   .Numeric Pain Rating Scale and Functional Assessment Average Pain 9   In the last MONTH (on 0-10 scale) has pain interfered with the following?  1. General activity like being  able to carry out your everyday physical activities such as walking, climbing stairs, carrying groceries, or moving a chair?  Rating(9)   +Driver, -BT, -Dye Allergies.

## 2020-11-02 NOTE — Patient Instructions (Signed)

## 2020-11-02 NOTE — Progress Notes (Signed)
[name] - 87 y.o. female MRN [id number]  Date of birth: 1930/05/30  Office Visit Note: Visit Date: 11/02/2020 PCP: Burnis Medin, MD Referred by: Burnis Medin, MD  Subjective: Chief Complaint  Patient presents with  . Left Hip - Pain   HPI:  [name] is a 87 y.o. female who comes in today at the request of Dr. Eunice Blase for planned Left sacroiliac joint injection with fluoroscopic guidance.  The patient has failed conservative care including home exercise, medications, time and activity modification.  This injection will be diagnostic and hopefully therapeutic.  Please see requesting physician notes for further details and justification.  By way of review Dr. Junius Roads injected the sacroiliac joint using ultrasound guidance on 10/13/2020 with really no relief according to the patient.  The patient's daughter is present today provides some of the history.  The patient has had a lower lumbar fusion from L2-L5 by Dr. Newman Pies on 04/24/2020.  She has had follow-up with Dr. Arnoldo Morale but has not had any advanced imaging of the lumbar spine.  She is having pain in the left posterior lateral hip over the PSIS but she does have some pain down the leg as well.  Depending on relief would have her follow-up with Dr. Earle Gell for further management.  If she did get great relief diagnostically with the sacroiliac joint could consider ablation procedure for the sacroiliac joint.  Also as of note she has severe left hip osteoarthritis and she has had a prior injection by Dr. Suella Broad into the hip without much relief but I think her symptoms are multifactorial.   ROS Otherwise per HPI.  Assessment & Plan: Visit Diagnoses:    ICD-10-CM   1. Sacroiliitis (HCC)  M46.1 Sacroiliac Joint Inj    XR C-ARM NO REPORT    Plan: No additional findings.   Meds & Orders: No orders of the defined types were placed in this encounter.   Orders Placed This Encounter  Procedures  .  Sacroiliac Joint Inj  . XR C-ARM NO REPORT    Follow-up: No follow-ups on file.   Procedures: Sacroiliac Joint Inj (Left) on 11/02/2020 1:40 PM Indications: pain and diagnostic evaluation Details: 22 G 3.5 in needle, fluoroscopy-guided posterior approach Medications: 2 mL bupivacaine 0.5 %; 80 mg methylPREDNISolone acetate 80 MG/ML Outcome: tolerated well, no immediate complications  Sacroiliac Joint Intra-Articular Injection - Posterior Approach with Fluoroscopic Guidance   Position: PRONE  Additional Comments: Vital signs were monitored before and after the procedure. Patient was prepped and draped in the usual sterile fashion. The correct patient, procedure, and site was verified.   Injection Procedure Details:   Location/Site:  Sacroiliac joint  Needle size: 3.5 in Spinal Needle  Needle type: Spinal  Needle Placement: Intra-articular  Findings:  -Comments: There was excellent flow of contrast producing a partial arthrogram of the sacroiliac joint.   Procedure Details: Starting with a 90 degree vertical and midline orientation the fluoroscope was tilted cranially 20 to 25 degrees and the target area of the inferior most part of the SI joint on the side mentioned above was visualized.  The soft tissues overlying this target were infiltrated with 4 ml. of 1% Lidocaine without Epinephrine. A #22 gauge spinal needle was inserted perpendicular to the fluoroscope table and advanced into the posterior inferior joint space using fluoroscopic guidance.  Position in the joint space was confirmed by obtaining a partial arthrogram using a 2 ml. volume of Isovue-250 contrast  agent. After negative aspirate for gross pus or blood, the injectate was delivered to the joint. Radiographs were obtained for documentation purposes.   Additional Comments:   Dressing: Bandaid    Post-procedure details: Patient was observed during the procedure. Post-procedure instructions were  reviewed.  Patient left the clinic in stable condition.    There was excellent flow of contrast producing a partial arthrogram of the sacroiliac joint.  Procedure, treatment alternatives, risks and benefits explained, specific risks discussed. Consent was given by the patient. Immediately prior to procedure a time out was called to verify the correct patient, procedure, equipment, support staff and site/side marked as required. Patient was prepped and draped in the usual sterile fashion.          Clinical History: No specialty comments available.     Objective:  VS:  HT:    WT:   BMI:     BP:130/71  HR:(!) 108bpm  TEMP: ( )  RESP:  Physical Exam Vitals and nursing note reviewed.  Constitutional:      General: She is not in acute distress.    Appearance: Normal appearance. She is not ill-appearing.  HENT:     Head: Normocephalic and atraumatic.     Right Ear: External ear normal.     Left Ear: External ear normal.  Eyes:     Extraocular Movements: Extraocular movements intact.  Cardiovascular:     Rate and Rhythm: Normal rate.     Pulses: Normal pulses.  Pulmonary:     Effort: Pulmonary effort is normal. No respiratory distress.  Abdominal:     General: There is no distension.     Palpations: Abdomen is soft.  Musculoskeletal:        General: Tenderness present.     Cervical back: Neck supple.     Right lower leg: No edema.     Left lower leg: No edema.     Comments: Patient has good distal strength with no pain over the greater trochanters.  No clonus or focal weakness.  She ambulates with a stiff lower lumbar spine with a cane.  Skin:    Findings: No erythema, lesion or rash.  Neurological:     General: No focal deficit present.     Mental Status: She is alert and oriented to person, place, and time.     Sensory: No sensory deficit.     Motor: No weakness or abnormal muscle tone.     Coordination: Coordination normal.  Psychiatric:        Mood and Affect:  Mood normal.        Behavior: Behavior normal.      Imaging: No results found.

## 2020-11-03 ENCOUNTER — Encounter: Payer: Self-pay | Admitting: Neurology

## 2020-11-03 ENCOUNTER — Telehealth: Payer: Self-pay | Admitting: Internal Medicine

## 2020-11-03 ENCOUNTER — Ambulatory Visit (INDEPENDENT_AMBULATORY_CARE_PROVIDER_SITE_OTHER): Payer: Medicare Other | Admitting: Neurology

## 2020-11-03 ENCOUNTER — Other Ambulatory Visit: Payer: Self-pay

## 2020-11-03 VITALS — BP 134/56 | HR 107 | Ht 60.0 in | Wt 142.0 lb

## 2020-11-03 DIAGNOSIS — G894 Chronic pain syndrome: Secondary | ICD-10-CM

## 2020-11-03 DIAGNOSIS — G253 Myoclonus: Secondary | ICD-10-CM

## 2020-11-03 DIAGNOSIS — N289 Disorder of kidney and ureter, unspecified: Secondary | ICD-10-CM | POA: Diagnosis not present

## 2020-11-03 DIAGNOSIS — R251 Tremor, unspecified: Secondary | ICD-10-CM

## 2020-11-03 NOTE — Telephone Encounter (Signed)
Pt need refill on these medications  naloxegol oxalate (MOVANTIK) 25 MG TABS tablet prescribed by another RX   traMADol (ULTRAM) 50 MG tablet need  30 pill pt; said she takes one a night   LORazepam (ATIVAN) 0.5 MG tablet pt states this not stronger enough- requesting a higher dosage.    [facility] [place], [place] AT [facility]  Phone:  [phone] Fax:  [phone]

## 2020-11-03 NOTE — Telephone Encounter (Signed)
Last OV: 08/14/2020 Future OV: None scheduled

## 2020-11-06 ENCOUNTER — Other Ambulatory Visit: Payer: Self-pay

## 2020-11-06 ENCOUNTER — Ambulatory Visit (INDEPENDENT_AMBULATORY_CARE_PROVIDER_SITE_OTHER): Payer: Medicare Other | Admitting: Neurology

## 2020-11-06 DIAGNOSIS — R251 Tremor, unspecified: Secondary | ICD-10-CM | POA: Diagnosis not present

## 2020-11-06 MED ORDER — METHYLPREDNISOLONE ACETATE 80 MG/ML IJ SUSP
80.0000 mg | INTRAMUSCULAR | Status: AC | PRN
  Administered 2020-11-02: 80 mg via INTRA_ARTICULAR

## 2020-11-06 MED ORDER — BUPIVACAINE HCL 0.5 % IJ SOLN
2.0000 mL | INTRAMUSCULAR | Status: AC | PRN
  Administered 2020-11-02: 2 mL via INTRA_ARTICULAR

## 2020-11-06 NOTE — Procedures (Signed)
TECHNICAL SUMMARY:  A multichannel referential and bipolar montage EEG using the standard international 10-20 system was performed on the patient described as awake and asleep.  The dominant background activity consists of 8.5 hertz activity seen most prominantly over the posterior head region.  The backgound activity is reactive to eye opening and closing procedures.  Low voltage fast (beta) activity is distributed symmetrically and maximally over the anterior head regions.  ACTIVATION:  Stepwise photic stimulation at 4-20 flashes per second was performed and did not elicit any abnormal waveforms.  Hyperventilation was not performed.  EPILEPTIFORM ACTIVITY:  There were no spikes, sharp waves or paroxysmal activity.  SLEEP: Both stage I and stage II sleep architecture are identified.   IMPRESSION:  This is a normal sleep deprived EEG for the patients stated age.  There were no focal, hemispheric or lateralizing features.  No epileptiform activity was recorded.  A normal EEG does not exclude the diagnosis of a seizure disorder and if seizure remains high on the list of differential diagnosis, an ambulatory EEG may be of value.  Clinical correlation is required.

## 2020-11-07 ENCOUNTER — Telehealth: Payer: Self-pay

## 2020-11-07 DIAGNOSIS — M4316 Spondylolisthesis, lumbar region: Secondary | ICD-10-CM | POA: Diagnosis not present

## 2020-11-07 DIAGNOSIS — M5136 Other intervertebral disc degeneration, lumbar region: Secondary | ICD-10-CM | POA: Diagnosis not present

## 2020-11-07 DIAGNOSIS — M48062 Spinal stenosis, lumbar region with neurogenic claudication: Secondary | ICD-10-CM | POA: Diagnosis not present

## 2020-11-07 DIAGNOSIS — E1122 Type 2 diabetes mellitus with diabetic chronic kidney disease: Secondary | ICD-10-CM | POA: Diagnosis not present

## 2020-11-07 DIAGNOSIS — N183 Chronic kidney disease, stage 3 unspecified: Secondary | ICD-10-CM | POA: Diagnosis not present

## 2020-11-07 DIAGNOSIS — D631 Anemia in chronic kidney disease: Secondary | ICD-10-CM | POA: Diagnosis not present

## 2020-11-07 NOTE — Telephone Encounter (Signed)
Called patient and informed her of normal results.  Patient verbalized understanding and had no questions or concerns.

## 2020-11-07 NOTE — Telephone Encounter (Signed)
-----   Message from Sour John, DO sent at 11/06/2020  3:31 PM EDT ----- Let pt/daughter know that EEG done today was very normal

## 2020-11-08 DIAGNOSIS — Z9889 Other specified postprocedural states: Secondary | ICD-10-CM

## 2020-11-08 DIAGNOSIS — G8929 Other chronic pain: Secondary | ICD-10-CM

## 2020-11-08 DIAGNOSIS — R2689 Other abnormalities of gait and mobility: Secondary | ICD-10-CM

## 2020-11-09 ENCOUNTER — Other Ambulatory Visit: Payer: Self-pay

## 2020-11-09 ENCOUNTER — Ambulatory Visit (INDEPENDENT_AMBULATORY_CARE_PROVIDER_SITE_OTHER): Payer: Medicare Other | Admitting: Neurology

## 2020-11-09 DIAGNOSIS — R251 Tremor, unspecified: Secondary | ICD-10-CM | POA: Diagnosis not present

## 2020-11-10 ENCOUNTER — Other Ambulatory Visit: Payer: Self-pay

## 2020-11-10 MED ORDER — TRAMADOL HCL 50 MG PO TABS: 100.0000 mg | ORAL_TABLET | Freq: Four times a day (QID) | ORAL | 0 refills | Status: DC | PRN

## 2020-11-10 NOTE — Telephone Encounter (Signed)
I am not comfortable prescribing Movantik because I ave never used it, but I did send in some Tramadol she can use until next week

## 2020-11-11 ENCOUNTER — Telehealth: Payer: Self-pay | Admitting: Internal Medicine

## 2020-11-11 MED ORDER — ALPRAZOLAM 1 MG PO TABS: 0.5000 mg | ORAL_TABLET | Freq: Every evening | ORAL | 0 refills | Status: DC | PRN

## 2020-11-11 NOTE — Telephone Encounter (Signed)
Please order home health PT  For  Her decrease mobility , chronic pain and  Hs of back surgery

## 2020-11-11 NOTE — Telephone Encounter (Signed)
I have not been prescribing meds  for pain management, had just given her a few in February to get her through until she would follow-up with Dr. Posey Pronto. I see that Dr. Sarajane Jews has sent in some tramadol.  There are number of messages and here about lorazepam and we switched to alprazolam as per family feeling it works better  Please contact patient and family and ask if she has been referred to pain management by Dr Posey Pronto.

## 2020-11-11 NOTE — Telephone Encounter (Signed)
Message request  was in husbands record  And I was out of office yesterday .  Family reports  1 mg of alprazolam given to her helps more than lorazepam 0.5   Discuss with  Daughter risk benefit   She seems to have pain syndrome but lots of anxiety  And up in middle of night .   Under eval fro jerking not felt to be medication so far  Taking tramadol.    Advise  Look into pain clinic for help with management   Daughter feels alprazolam would work better than higher dose of lorazepam .   Will send in smo 1 mg alprazolam for now  .  Risk benefit of medication discussed. With daughter  And spouse.

## 2020-11-13 ENCOUNTER — Telehealth: Payer: Self-pay | Admitting: Neurology

## 2020-11-13 ENCOUNTER — Telehealth: Payer: Self-pay

## 2020-11-13 ENCOUNTER — Other Ambulatory Visit: Payer: Self-pay | Admitting: Physical Medicine & Rehabilitation

## 2020-11-13 DIAGNOSIS — N312 Flaccid neuropathic bladder, not elsewhere classified: Secondary | ICD-10-CM | POA: Diagnosis not present

## 2020-11-13 NOTE — Telephone Encounter (Signed)
What is her local pharmacy please confirm before I send another prescription  We will send in a prescription that says take 50 to 100 mg once or twice a day as needed> Did they get a referral to pain management

## 2020-11-13 NOTE — Telephone Encounter (Signed)
Let pt/daughter know that her long/ambulatory EEG was normal, despite the fact that she had numerous jerking spells during it.  That says that they are not from abnormal brain waves/seizures.  Don't think that this is a primary neurologic issue and she will need to f/u with PCP about them

## 2020-11-13 NOTE — Telephone Encounter (Signed)
I spoke with the patients daughter and she stated that the preferred pharmacy is [facility] on [address]. Follow up visit scheduled for 06/14.

## 2020-11-13 NOTE — Telephone Encounter (Signed)
Noted  

## 2020-11-13 NOTE — Telephone Encounter (Signed)
Also Arrange for her and appointment on my schedule in June.  So we can follow-up med check

## 2020-11-13 NOTE — Telephone Encounter (Signed)
I spoke with the patients daughter and she stated that the patient is taking Tramadol every night and that it does help with the patients jerking, Patients daughter stated that [place] does help the patient and the patient is currently not seeing Dr. Posey Pronto. Patient daughter stated she did not get the rx for Alprazolam because her pharmacy was not open over the weekend.

## 2020-11-13 NOTE — Telephone Encounter (Addendum)
I sent in alprazolam and to stop the lorazepam as requested by family and daughter.  Please review the phone messages in my chart messages.  Have not been prescribing the tramadol on a regular basis this has come from Dr. Posey Pronto  If Dr. Posey Pronto is no longing seeing her we can give a high over amount until she gets a pain referral for best practices  These contact her and her family  how often is she taking the tramadol.? Does it help her?   Does the Movantik help her?  Did she get the rx for alprazolam and to stop t he lorazepam? Document local pharmacy  For now

## 2020-11-13 NOTE — Procedures (Signed)
ELECTROENCEPHALOGRAM REPORT  Dates of Recording: 11/09/2020 10:50AM to 11/10/2020 10:57AM  Patient's Name: [name] MRN: [id number] Date of Birth: Jul 10, 1929  Referring Provider: Dr. Wells Guiles Tat  Procedure: 24-hour ambulatory video EEG  History: This is an 87 year old woman with recurrent episodes of jerking. EEG for classification.  Medications:  TYLENOL 500 MG tablet aspirin EC 81 MG tablet LIPITOR 10 MG tablet VITAMIN D 50 MCG (2000 UT) tablet cyanocobalamin 1000 MCG tablet COLACE 100 MG capsule NEXIUM 40 MG capsule LASIX 40 MG tablet GLUCOTROL 10 MG tablet ATIVAN 0.5 MG tablet MOVANTIK 25 MG TABS tablet SENOKOT-S 8.6-50 MG tablet ULTRAM 50 MG tablet Trospium Chloride 60 MG CP24 Turmeric 500 MG CAPS   Technical Summary: This is a 24-hour multichannel digital video EEG recording measured by the international 10-20 system with electrodes applied with paste and impedances below 5000 ohms performed as portable with EKG monitoring.  The digital EEG was referentially recorded, reformatted, and digitally filtered in a variety of bipolar and referential montages for optimal display.    DESCRIPTION OF RECORDING: During maximal wakefulness, the background activity consisted of a symmetric 8 Hz posterior dominant rhythm which was reactive to eye opening.  There were no epileptiform discharges or focal slowing seen in wakefulness.  During the recording, the patient progresses through wakefulness, drowsiness, and Stage 2 sleep.  Again, there were no epileptiform discharges seen.  Events: There were multiple push button events on 11/09/20 at 1917, 2031, 2047, 2050, 2053, 2122, 2126, 2128, 2129, 2130, 2159, 2202, 2210, and 5/6 0255 hours for jerking/fideting. No clear changes seen on video, patient is sitting on her chair and would reach for push button during events. Electrographically, there were no EEG or EKG changes seen.  There were no electrographic seizures seen.  EKG lead was  unremarkable.  IMPRESSION: This 24-hour ambulatory video EEG study is normal.    CLINICAL CORRELATION: A normal EEG does not exclude a clinical diagnosis of epilepsy. Numerous push button events for jerking/fidgeting did not show electrographic correlate and were not clearly visualized on video. If further clinical questions remain, inpatient video EEG monitoring may be helpful.   Ellouise Newer, M.D.

## 2020-11-13 NOTE — Telephone Encounter (Signed)
Patient is requesting refills for Tramadol 50mg , Lorazepam 1mg , Movantik 25mg .

## 2020-11-13 NOTE — Telephone Encounter (Signed)
Please refer to previous telephone note 

## 2020-11-13 NOTE — Telephone Encounter (Signed)
Notified pt daughter [name]) regarding EEG and instructions by Dr. Carles Collet. Pt daughter voiced understanding and will call pt PCP for f/u.

## 2020-11-14 DIAGNOSIS — M4316 Spondylolisthesis, lumbar region: Secondary | ICD-10-CM | POA: Diagnosis not present

## 2020-11-14 DIAGNOSIS — M48062 Spinal stenosis, lumbar region with neurogenic claudication: Secondary | ICD-10-CM | POA: Diagnosis not present

## 2020-11-14 DIAGNOSIS — E1122 Type 2 diabetes mellitus with diabetic chronic kidney disease: Secondary | ICD-10-CM | POA: Diagnosis not present

## 2020-11-14 DIAGNOSIS — M5136 Other intervertebral disc degeneration, lumbar region: Secondary | ICD-10-CM | POA: Diagnosis not present

## 2020-11-14 DIAGNOSIS — N183 Chronic kidney disease, stage 3 unspecified: Secondary | ICD-10-CM | POA: Diagnosis not present

## 2020-11-14 DIAGNOSIS — D631 Anemia in chronic kidney disease: Secondary | ICD-10-CM | POA: Diagnosis not present

## 2020-11-14 MED ORDER — NALOXEGOL OXALATE 25 MG PO TABS: 25.0000 mg | ORAL_TABLET | Freq: Every day | ORAL | 1 refills | Status: DC

## 2020-11-14 NOTE — Telephone Encounter (Signed)
Patients daughter informed of the message below and verbalized understanding. Patient daughter just wanted to inform PCP that the neurology visit was ok and was told to follow up with PCP and that follow up has been scheduled.

## 2020-11-14 NOTE — Telephone Encounter (Signed)
Movantik was sent this morning to the corrected pharmacy Let us know if you did not get.

## 2020-11-14 NOTE — Addendum Note (Signed)
Addended byBurnis Medin on: 11/14/2020 08:32 AM   Modules accepted: Orders

## 2020-11-14 NOTE — Telephone Encounter (Signed)
Dr. Sarajane Jews sent in tramadol last week.  I did send in the [place] however to pharmacy she identified. Avoid taking the alprazolam with the tramadol to avoid sedation risky side effect.

## 2020-11-15 ENCOUNTER — Telehealth: Payer: Self-pay | Admitting: Internal Medicine

## 2020-11-15 DIAGNOSIS — D631 Anemia in chronic kidney disease: Secondary | ICD-10-CM | POA: Diagnosis not present

## 2020-11-15 DIAGNOSIS — Z7982 Long term (current) use of aspirin: Secondary | ICD-10-CM | POA: Diagnosis not present

## 2020-11-15 DIAGNOSIS — N183 Chronic kidney disease, stage 3 unspecified: Secondary | ICD-10-CM | POA: Diagnosis not present

## 2020-11-15 DIAGNOSIS — E1122 Type 2 diabetes mellitus with diabetic chronic kidney disease: Secondary | ICD-10-CM | POA: Diagnosis not present

## 2020-11-15 DIAGNOSIS — E785 Hyperlipidemia, unspecified: Secondary | ICD-10-CM | POA: Diagnosis not present

## 2020-11-15 DIAGNOSIS — Z90710 Acquired absence of both cervix and uterus: Secondary | ICD-10-CM | POA: Diagnosis not present

## 2020-11-15 DIAGNOSIS — Z9842 Cataract extraction status, left eye: Secondary | ICD-10-CM | POA: Diagnosis not present

## 2020-11-15 DIAGNOSIS — K219 Gastro-esophageal reflux disease without esophagitis: Secondary | ICD-10-CM | POA: Diagnosis not present

## 2020-11-15 DIAGNOSIS — Z981 Arthrodesis status: Secondary | ICD-10-CM | POA: Diagnosis not present

## 2020-11-15 DIAGNOSIS — Z87440 Personal history of urinary (tract) infections: Secondary | ICD-10-CM | POA: Diagnosis not present

## 2020-11-15 DIAGNOSIS — M1990 Unspecified osteoarthritis, unspecified site: Secondary | ICD-10-CM | POA: Diagnosis not present

## 2020-11-15 DIAGNOSIS — Z923 Personal history of irradiation: Secondary | ICD-10-CM | POA: Diagnosis not present

## 2020-11-15 DIAGNOSIS — Z79891 Long term (current) use of opiate analgesic: Secondary | ICD-10-CM | POA: Diagnosis not present

## 2020-11-15 DIAGNOSIS — Z9841 Cataract extraction status, right eye: Secondary | ICD-10-CM | POA: Diagnosis not present

## 2020-11-15 DIAGNOSIS — Z9181 History of falling: Secondary | ICD-10-CM | POA: Diagnosis not present

## 2020-11-15 DIAGNOSIS — N319 Neuromuscular dysfunction of bladder, unspecified: Secondary | ICD-10-CM | POA: Diagnosis not present

## 2020-11-15 DIAGNOSIS — I4510 Unspecified right bundle-branch block: Secondary | ICD-10-CM | POA: Diagnosis not present

## 2020-11-15 DIAGNOSIS — M48062 Spinal stenosis, lumbar region with neurogenic claudication: Secondary | ICD-10-CM | POA: Diagnosis not present

## 2020-11-15 DIAGNOSIS — Z7984 Long term (current) use of oral hypoglycemic drugs: Secondary | ICD-10-CM | POA: Diagnosis not present

## 2020-11-15 DIAGNOSIS — M4316 Spondylolisthesis, lumbar region: Secondary | ICD-10-CM | POA: Diagnosis not present

## 2020-11-15 DIAGNOSIS — Z853 Personal history of malignant neoplasm of breast: Secondary | ICD-10-CM | POA: Diagnosis not present

## 2020-11-15 DIAGNOSIS — Z9350 Unspecified cystostomy status: Secondary | ICD-10-CM | POA: Diagnosis not present

## 2020-11-15 DIAGNOSIS — K5790 Diverticulosis of intestine, part unspecified, without perforation or abscess without bleeding: Secondary | ICD-10-CM | POA: Diagnosis not present

## 2020-11-15 DIAGNOSIS — M5136 Other intervertebral disc degeneration, lumbar region: Secondary | ICD-10-CM | POA: Diagnosis not present

## 2020-11-15 DIAGNOSIS — Z961 Presence of intraocular lens: Secondary | ICD-10-CM | POA: Diagnosis not present

## 2020-11-15 NOTE — Telephone Encounter (Signed)
Kelly from [facility] called to extend [place] for 1 time a week for 9 weeks  [place]

## 2020-11-15 NOTE — Telephone Encounter (Signed)
Left a message for kelly with [facility] to return my call.

## 2020-11-15 NOTE — Telephone Encounter (Signed)
Agree to extension

## 2020-11-16 NOTE — Telephone Encounter (Signed)
Left a message for kelly with [facility] to return my call.

## 2020-11-20 ENCOUNTER — Other Ambulatory Visit: Payer: Self-pay

## 2020-11-20 ENCOUNTER — Ambulatory Visit (INDEPENDENT_AMBULATORY_CARE_PROVIDER_SITE_OTHER): Payer: Medicare Other

## 2020-11-20 ENCOUNTER — Other Ambulatory Visit: Payer: Self-pay | Admitting: Internal Medicine

## 2020-11-20 DIAGNOSIS — M5136 Other intervertebral disc degeneration, lumbar region: Secondary | ICD-10-CM | POA: Diagnosis not present

## 2020-11-20 DIAGNOSIS — D631 Anemia in chronic kidney disease: Secondary | ICD-10-CM | POA: Diagnosis not present

## 2020-11-20 DIAGNOSIS — E1122 Type 2 diabetes mellitus with diabetic chronic kidney disease: Secondary | ICD-10-CM | POA: Diagnosis not present

## 2020-11-20 DIAGNOSIS — Z0000 Encounter for general adult medical examination without abnormal findings: Secondary | ICD-10-CM

## 2020-11-20 DIAGNOSIS — M4316 Spondylolisthesis, lumbar region: Secondary | ICD-10-CM | POA: Diagnosis not present

## 2020-11-20 DIAGNOSIS — M48062 Spinal stenosis, lumbar region with neurogenic claudication: Secondary | ICD-10-CM | POA: Diagnosis not present

## 2020-11-20 DIAGNOSIS — N183 Chronic kidney disease, stage 3 unspecified: Secondary | ICD-10-CM | POA: Diagnosis not present

## 2020-11-20 NOTE — Progress Notes (Signed)
Subjective:   [name] is a 87 y.o. female who presents for Medicare Annual (Subsequent) preventive examination.  I connected with to Jill Poling by telephone and verified that I am speaking with the correct person using two identifiers. Location patient: home Location provider: work Persons participating in the virtual visit: patient, provider.   I discussed the limitations, risks, security and privacy concerns of performing an evaluation and management service by telephone and the availability of in person appointments. I also discussed with the patient that there may be a patient responsible charge related to this service. The patient expressed understanding and verbally consented to this telephonic visit.    Interactive audio and video telecommunications were attempted between this provider and patient, however failed, due to patient having technical difficulties OR patient did not have access to video capability.  We continued and completed visit with audio only.    Review of Systems    n/a       Objective:    There were no vitals filed for this visit. There is no height or weight on file to calculate BMI.  Advanced Directives 11/03/2020 09/28/2020 07/05/2020 05/31/2020 05/02/2020 04/28/2020 04/19/2020  Does Patient Have a Medical Advance Directive? No Yes Yes Yes No No No  Type of Advance Directive - Public librarian;Living will [place] - - -  Does patient want to make changes to medical advance directive? - No - Patient declined No - Patient declined No - Guardian declined - - -  Copy of Healthcare Power of Attorney in Chart? - - No - copy requested - - - -  Would patient like information on creating a medical advance directive? - - - No - Guardian declined No - Patient declined No - Patient declined Yes (MAU/Ambulatory/Procedural Areas - Information given)    Current Medications (verified) Outpatient Encounter Medications as of  11/20/2020  Medication Sig  . acetaminophen (TYLENOL) 500 MG tablet Take 500 mg by mouth every 6 (six) hours as needed for mild pain or moderate pain.   Marland Kitchen ALPRAZolam (XANAX) 1 MG tablet Take 0.5-1 tablets (0.5-1 mg total) by mouth at bedtime as needed for anxiety. Avoid regular use.  Marland Kitchen aspirin EC 81 MG tablet Take 81 mg by mouth daily at 12 noon.   Marland Kitchen atorvastatin (LIPITOR) 10 MG tablet Take 1 tablet (10 mg total) by mouth daily.  . Cholecalciferol (VITAMIN D) 50 MCG (2000 UT) tablet Take 2,000 Units by mouth daily.  . cyanocobalamin 1000 MCG tablet Take 1,000 mcg by mouth at bedtime.   . docusate sodium (COLACE) 100 MG capsule Take 1 capsule (100 mg total) by mouth 2 (two) times daily.  Marland Kitchen esomeprazole (NEXIUM) 40 MG capsule Take 1 capsule (40 mg total) by mouth daily.  . furosemide (LASIX) 40 MG tablet Take 1 tablet (40 mg total) by mouth daily. Or as directed (Patient taking differently: Take 40 mg by mouth daily.)  . glipiZIDE (GLUCOTROL) 10 MG tablet Take 1 tablet (10 mg total) by mouth in the morning.  . naloxegol oxalate (MOVANTIK) 25 MG TABS tablet Take 1 tablet (25 mg total) by mouth daily.  Marland Kitchen senna-docusate (SENOKOT-S) 8.6-50 MG tablet Take 2 tablets by mouth 2 (two) times daily.  . traMADol (ULTRAM) 50 MG tablet Take 2 tablets (100 mg total) by mouth every 6 (six) hours as needed for moderate pain.  . Trospium Chloride 60 MG CP24 Take 1 capsule by mouth daily.  . Turmeric 500 MG  CAPS Take by mouth daily.   No facility-administered encounter medications on file as of 11/20/2020.    Allergies (verified) Phenergan [promethazine] and Remeron [mirtazapine]   History: Past Medical History:  Diagnosis Date  . Anemia   . Arthritis   . Bilateral edema of lower extremity   . 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 Chronic lower back pain   . CKD (chronic kidney disease) stage 3, GFR 30-59 ml/min (HCC)   . DDD (degenerative disc disease)    lumbar  . Diverticulosis   . Family history of breast cancer   . Family history of pancreatic cancer   . Family history of prostate cancer   . Flaccid neuropathic bladder, not elsewhere classified   . Foley catheter in place   . GERD (gastroesophageal reflux disease)   . Hemorrhoids   . Hiatal hernia   . History of colon polyps   . History of radiation therapy 09-12-2015 to 10-12-2015   42.72 gray in 16 fractions directed right breast w/ boost of 12 gray in 6 fractions directed at the lumpectomy cavity- Total dose: 54.72y  . History of recurrent UTIs   . Hyperlipidemia   . PONV (postoperative nausea and vomiting)   . RBBB (right bundle branch block with left anterior fascicular block)   . Type 2 diabetes mellitus ([place])   . Urinary retention with incomplete bladder emptying   . Wears dentures    full upper and lower partial  . Wears glasses   . Wears hearing aid    bilateral but does not wear   Past Surgical History:  Procedure Laterality Date  . BACK SURGERY    . BREAST LUMPECTOMY WITH RADIOACTIVE SEED LOCALIZATION Right 08/03/2015   Procedure: BREAST LUMPECTOMY WITH RADIOACTIVE SEED LOCALIZATION;  Surgeon: [name], MD;  Location: [place];  Service: General;  Laterality: Right;  . BREAST SURGERY    . CARDIOVASCULAR STRESS TEST  01/14/2012   Low risk nuclear study w/ small mild apical and apical lateral reversible perfusion defect represents a small area of ischemia versus shifting breast artifact/  normal LV funciton and wall motion, ef 86%  . CARPAL TUNNEL RELEASE Right 1977  . CATARACT EXTRACTION W/ INTRAOCULAR LENS  IMPLANT, BILATERAL Bilateral left 1996/  right 1997  . CLOSED RIGHT KNEE MANIPULATION  08/11/2002   post TKA  . CYSTOSTOMY N/A 05/17/2016   Procedure: CYSTOSCOPY WITH   SUPRAPUBIC PLACMENT;  Surgeon: [name], MD;  Location: [facility];  Service: Urology;  Laterality: N/A;  . EYE SURGERY    . GANGLION CYST EXCISION Right 12/09/2001   right palm  . HERNIA REPAIR    . JOINT REPLACEMENT    . KNEE ARTHROSCOPY Right 12/14/2002   w/ Lysis Adhesions  . LAMINECTOMY  04/24/2020   Bilateral redo L2-3, L3-4 and L4-5 laminotomy/laminectomy/foraminotomies/medial facetectomy to decompress the bilateral L3, L4 and L5 nerve roots  . LUMBAR LAMINECTOMY/DECOMPRESSION MICRODISCECTOMY N/A 03/06/2016   Procedure: CENTRAL DECOMPRESSION L3 - L4 ,L4 - L5;  Surgeon: [name], MD;  Location: [facility];  Service: Orthopedics;  Laterality: N/A;  . SHOULDER HEMI-ARTHROPLASTY Right 02/20/2009   avascular necrosis   . TOTAL KNEE ARTHROPLASTY Bilateral right 06-23-2002/  left 07-11-2008  . [place]  .  VAGINAL HYSTERECTOMY  1975   Family History  Problem Relation Age of Onset  . Pancreatic cancer Mother 67  . Diabetes Mother   . Prostate cancer Brother   . Diabetes Father   . Heart disease Father   . Diabetes Brother   . Heart attack Brother   . Diabetes Sister   . Diabetes Daughter   . Diabetes Son   . Coronary artery disease Brother        MI in his 42s  . Colon cancer Neg Hx   . Stomach cancer Neg Hx    Social History   Socioeconomic History  . Marital status: Married    Spouse name: Tyerra Loretto  . Number of children: 4  . Years of education: 47  . Highest education level: 12th grade  Occupational History  . Occupation: Retired  Tobacco Use  . Smoking status: Never Smoker  . Smokeless tobacco: Never Used  Vaping Use  . Vaping Use: Never used  Substance and Sexual Activity  . Alcohol use: No  . Drug use: No  . Sexual activity: Never  Other Topics Concern  . Not on file  Social History Narrative   HH 2 married   Works for [employer] for 40 years retired   No pets    Lives w spouse; married in 24; 58 years     Social Determinants of Health   Financial Resource Strain: Low Risk   . Difficulty of Paying Living Expenses: Not hard at all  Food Insecurity: No Food Insecurity  . Worried About Charity fundraiser in the Last Year: Never true  . Ran Out of Food in the Last Year: Never true  Transportation Needs: No Transportation Needs  . Lack of Transportation (Medical): No  . Lack of Transportation (Non-Medical): No  Physical Activity: Inactive  . Days of Exercise per Week: 0 days  . Minutes of Exercise per Session: 0 min  Stress: No Stress Concern Present  . Feeling of Stress : Only a little  Social Connections: Unknown  . Frequency of Communication with Friends and Family: More than three times a week  . Frequency of Social Gatherings with Friends and Family: More than three times a week  . Attends Religious Services: Not on file  . Active Member of Clubs or Organizations: No  . Attends Archivist Meetings: Never  . Marital Status: Married    Tobacco Counseling Counseling given: Not Answered   Clinical Intake:                 Diabetic?yes Nutrition Risk Assessment:  Has the patient had any N/V/D within the last 2 months?  No  Does the patient have any non-healing wounds?  No  Has the patient had any unintentional weight loss or weight gain?  No   Diabetes:  Is the patient diabetic?  Yes  If diabetic, was a CBG obtained today?  No  Did the patient bring in their glucometer from home?  No  How often do you monitor your CBG's? daily.   Financial Strains and Diabetes Management:  Are you having any financial strains with the device, your supplies or your medication? No .  Does the patient want to be seen by Chronic Care Management for management of their diabetes?  No  Would the patient like to be referred to a Nutritionist or for Diabetic Management?  No   Diabetic Exams:  Diabetic Eye Exam: Completed Saw Dr.Hecker 08/2020 Diabetic Foot Exam: Overdue,  Pt  has been advised about the importance in completing this exam. Pt is scheduled for diabetic foot exam on next office visit .          Activities of Daily Living In your present state of health, do you have any difficulty performing the following activities: 07/05/2020 05/02/2020  Hearing? N Y  Vision? N N  Difficulty concentrating or making decisions? N N  Walking or climbing stairs? Y Y  Comment Impaired balance and gait -  Dressing or bathing? Y N  Comment Impaired balance and gait -  Doing errands, shopping? Y Y  Comment Impaired balance and gait -  Preparing Food and eating ? Y -  Comment Impaired balance and gait -  Using the Toilet? N -  In the past six months, have you accidently leaked urine? Y -  Comment Occasional urinary incontinence -  Do you have problems with loss of bowel control? N -  Managing your Medications? N -  Managing your Finances? N -  Housekeeping or managing your Housekeeping? Y -  Comment Impaired balance and gait -  Some recent data might be hidden    Patient Care Team: Panosh, Standley Brooking, MD as PCP - General (Internal Medicine) Freada Bergeron, MD as PCP - Cardiology (Cardiology) Monna Fam, MD (Ophthalmology) Nicholas Lose, MD as Consulting Physician (Hematology and Oncology) Gery Pray, MD as Consulting Physician (Radiation Oncology) [name], MD as Consulting Physician (General Surgery) [name], MD (Inactive) as Consulting Physician (Urology) Delice Bison, Charlestine Massed, NP as Nurse Practitioner (Hematology and Oncology) Jamse Arn, MD as Consulting Physician (Physical Medicine and Rehabilitation) Dimitri Ped, RN as Case Manager Tat, Eustace Quail, DO as Consulting Physician (Neurology)  Indicate any recent Medical Services you may have received from other than [facility] providers in the past year (date may be approximate).     Assessment:   This is a routine wellness examination for  Niko.  Hearing/Vision screen No exam data present  Dietary issues and exercise activities discussed:    Goals Addressed   None    Depression Screen PHQ 2/9 Scores 09/28/2020 07/05/2020 06/28/2020 06/12/2020 05/18/2020 08/28/2018 09/12/2016  PHQ - 2 Score 1 4 6 6  - 0 0  PHQ- 9 Score - 15 21 23  - - -  Exception Documentation - - - - (No Data) - -    Fall Risk Fall Risk  11/03/2020 09/28/2020 07/05/2020 07/05/2020 06/12/2020  Falls in the past year? 1 1 1 1 1   Comment - - - - -  Number falls in past yr: 1 0 1 1 0  Comment - fell 4 days after her surgery - - -  Injury with Fall? 0 1 1 1  0  Risk for fall due to : - History of fall(s);Impaired mobility History of fall(s);Impaired balance/gait;Impaired mobility - -  Follow up - Education provided;Falls prevention discussed Education provided;Falls prevention discussed - -    FALL RISK PREVENTION PERTAINING TO THE HOME:  Any stairs in or around the home? No  If so, are there any without handrails? No  Home free of loose throw rugs in walkways, pet beds, electrical cords, etc? Yes  Adequate lighting in your home to reduce risk of falls? Yes   ASSISTIVE DEVICES UTILIZED TO PREVENT FALLS:  Life alert? No  Use of a cane, walker or w/c? Yes  Grab bars in the bathroom? Yes  Shower chair or bench in shower? Yes  Elevated toilet seat or a handicapped toilet? Yes  Cognitive Function: Normal cognitive stat US assessed by direct observation by this Nurse Health Advisor. No abnormalities found.   MMSE - Mini Mental State Exam 09/12/2016  Not completed: (No Data)        Immunizations Immunization History  Administered Date(s) Administered  . Fluad Quad(high Dose 65+) 04/24/2019, 04/12/2020  . Influenza Split 05/08/2011, 04/21/2012  . Influenza Whole 03/11/2006, 04/30/2007, 04/13/2008, 05/17/2009, 04/24/2010  . Influenza, High Dose Seasonal PF 05/11/2014, 05/01/2015, 04/26/2016, 04/29/2017, 05/19/2018  . Influenza,inj,Quad PF,6+ Mos  03/22/2013  . Influenza,inj,quad, With Preservative 03/25/2019  . Moderna Sars-Covid-2 Vaccination 07/19/2019, 08/16/2019, 06/08/2020  . Pneumococcal Conjugate-13 12/16/2013  . Pneumococcal Polysaccharide-23 05/09/1999, 05/18/2008  . Tetanus 01/11/2013  . Zoster 05/06/2006    TDAP status: Up to date  Flu Vaccine status: Up to date  Pneumococcal vaccine status: Up to date  Covid-19 vaccine status: Completed vaccines  Qualifies for Shingles Vaccine? Yes   Zostavax completed No   Shingrix Completed?: No.    Education has been provided regarding the importance of this vaccine. Patient has been advised to call insurance company to determine out of pocket expense if they have not yet received this vaccine. Advised may also receive vaccine at local pharmacy or Health Dept. Verbalized acceptance and understanding.  Screening Tests Health Maintenance  Topic Date Due  . FOOT EXAM  08/30/2020  . URINE MICROALBUMIN  08/30/2020  . COVID-19 Vaccine (4 - Booster for Moderna series) 09/06/2020  . DEXA SCAN  09/05/2028 (Originally 02/20/1997)  . INFLUENZA VACCINE  02/05/2021  . HEMOGLOBIN A1C  02/11/2021  . OPHTHALMOLOGY EXAM  09/06/2021  . TETANUS/TDAP  01/12/2023  . PNA vac Low Risk Adult  Completed  . HPV VACCINES  Aged Out    Health Maintenance  Health Maintenance Due  Topic Date Due  . FOOT EXAM  08/30/2020  . URINE MICROALBUMIN  08/30/2020  . COVID-19 Vaccine (4 - Booster for Moderna series) 09/06/2020    Colorectal cancer screening: No longer required.   Mammogram status: No longer required due to age.  Bone Density status: Ordered 11/20/2020. Pt provided with contact info and advised to call to schedule appt.  Lung Cancer Screening: (Low Dose CT Chest recommended if Age 37-80 years, 30 pack-year currently smoking OR have quit w/in 15years.) does not qualify.   Lung Cancer Screening Referral: n/a  Additional Screening:  Hepatitis C Screening: does not qualify  Vision  Screening: Recommended annual ophthalmology exams for early detection of glaucoma and other disorders of the eye. Is the patient up to date with their annual eye exam?  Yes  Who is the provider or what is the name of the office in which the patient attends annual eye exams? Dr.Hecker  If pt is not established with a provider, would they like to be referred to a provider to establish care? No .   Dental Screening: Recommended annual dental exams for proper oral hygiene  Community Resource Referral / Chronic Care Management: CRR required this visit?  No   CCM required this visit?  No      Plan:     I have personally reviewed and noted the following in the patient's chart:   . Medical and social history . Use of alcohol, tobacco or illicit drugs  . Current medications and supplements including opioid prescriptions.  . Functional ability and status . Nutritional status . Physical activity . Advanced directives . List of other physicians . Hospitalizations, surgeries, and ER visits in previous 12 months . Vitals . Screenings  to include cognitive, depression, and falls . Referrals and appointments  In addition, I have reviewed and discussed with patient certain preventive protocols, quality metrics, and best practice recommendations. A written personalized care plan for preventive services as well as general preventive health recommendations were provided to patient.     Randel Pigg, LPN   [id number]   Nurse Notes: none

## 2020-11-20 NOTE — Telephone Encounter (Signed)
Claiborne Billings returned the call to the office

## 2020-11-20 NOTE — Telephone Encounter (Signed)
Verbal given to [employer]

## 2020-11-20 NOTE — Patient Instructions (Signed)
[name] , Thank you for taking time to come for your Medicare Wellness Visit. I appreciate your ongoing commitment to your health goals. Please review the following plan we discussed and let me know if I can assist you in the future.   Screening recommendations/referrals: Colonoscopy: no longer required  Mammogram: no longer required  Bone Density: no longer required  Recommended yearly ophthalmology/optometry visit for glaucoma screening and checkup Recommended yearly dental visit for hygiene and checkup  Vaccinations: Influenza vaccine: current due in fall 2022 Pneumococcal vaccine: completed series  Tdap vaccine: current due 01/12/2023 Shingles vaccine: will obtain local pharmacy    Advanced directives: none   Conditions/risks identified: none   Next appointment: June 14 @ 145pm [name]    Preventive Care 65 Years and Older, Female Preventive care refers to lifestyle choices and visits with your health care provider that can promote health and wellness. What does preventive care include?  A yearly physical exam. This is also called an annual well check.  Dental exams once or twice a year.  Routine eye exams. Ask your health care provider how often you should have your eyes checked.  Personal lifestyle choices, including:  Daily care of your teeth and gums.  Regular physical activity.  Eating a healthy diet.  Avoiding tobacco and drug use.  Limiting alcohol use.  Practicing safe sex.  Taking low-dose aspirin every day.  Taking vitamin and mineral supplements as recommended by your health care provider. What happens during an annual well check? The services and screenings done by your health care provider during your annual well check will depend on your age, overall health, lifestyle risk factors, and family history of disease. Counseling  Your health care provider may ask you questions about your:  Alcohol use.  Tobacco use.  Drug use.  Emotional  well-being.  Home and relationship well-being.  Sexual activity.  Eating habits.  History of falls.  Memory and ability to understand (cognition).  Work and work Statistician.  Reproductive health. Screening  You may have the following tests or measurements:  Height, weight, and BMI.  Blood pressure.  Lipid and cholesterol levels. These may be checked every 5 years, or more frequently if you are over 57 years old.  Skin check.  Lung cancer screening. You may have this screening every year starting at age 29 if you have a 30-pack-year history of smoking and currently smoke or have quit within the past 15 years.  Fecal occult blood test (FOBT) of the stool. You may have this test every year starting at age 51.  Flexible sigmoidoscopy or colonoscopy. You may have a sigmoidoscopy every 5 years or a colonoscopy every 10 years starting at age 40.  Hepatitis C blood test.  Hepatitis B blood test.  Sexually transmitted disease (STD) testing.  Diabetes screening. This is done by checking your blood sugar (glucose) after you have not eaten for a while (fasting). You may have this done every 1-3 years.  Bone density scan. This is done to screen for osteoporosis. You may have this done starting at age 64.  Mammogram. This may be done every 1-2 years. Talk to your health care provider about how often you should have regular mammograms. Talk with your health care provider about your test results, treatment options, and if necessary, the need for more tests. Vaccines  Your health care provider may recommend certain vaccines, such as:  Influenza vaccine. This is recommended every year.  Tetanus, diphtheria, and acellular pertussis (Tdap,  Td) vaccine. You may need a Td booster every 10 years.  Zoster vaccine. You may need this after age 20.  Pneumococcal 13-valent conjugate (PCV13) vaccine. One dose is recommended after age 70.  Pneumococcal polysaccharide (PPSV23) vaccine. One  dose is recommended after age 52. Talk to your health care provider about which screenings and vaccines you need and how often you need them. This information is not intended to replace advice given to you by your health care provider. Make sure you discuss any questions you have with your health care provider. Document Released: 07/21/2015 Document Revised: 03/13/2016 Document Reviewed: 04/25/2015 Elsevier Interactive Patient Education  2017 [employer] Prevention in the Home Falls can cause injuries. They can happen to people of all ages. There are many things you can do to make your home safe and to help prevent falls. What can I do on the outside of my home?  Regularly fix the edges of walkways and driveways and fix any cracks.  Remove anything that might make you trip as you walk through a door, such as a raised step or threshold.  Trim any bushes or trees on the path to your home.  Use bright outdoor lighting.  Clear any walking paths of anything that might make someone trip, such as rocks or tools.  Regularly check to see if handrails are loose or broken. Make sure that both sides of any steps have handrails.  Any raised decks and porches should have guardrails on the edges.  Have any leaves, snow, or ice cleared regularly.  Use sand or salt on walking paths during winter.  Clean up any spills in your garage right away. This includes oil or grease spills. What can I do in the bathroom?  Use night lights.  Install grab bars by the toilet and in the tub and shower. Do not use towel bars as grab bars.  Use non-skid mats or decals in the tub or shower.  If you need to sit down in the shower, use a plastic, non-slip stool.  Keep the floor dry. Clean up any water that spills on the floor as soon as it happens.  Remove soap buildup in the tub or shower regularly.  Attach bath mats securely with double-sided non-slip rug tape.  Do not have throw rugs and other  things on the floor that can make you trip. What can I do in the bedroom?  Use night lights.  Make sure that you have a light by your bed that is easy to reach.  Do not use any sheets or blankets that are too big for your bed. They should not hang down onto the floor.  Have a firm chair that has side arms. You can use this for support while you get dressed.  Do not have throw rugs and other things on the floor that can make you trip. What can I do in the kitchen?  Clean up any spills right away.  Avoid walking on wet floors.  Keep items that you use a lot in easy-to-reach places.  If you need to reach something above you, use a strong step stool that has a grab bar.  Keep electrical cords out of the way.  Do not use floor polish or wax that makes floors slippery. If you must use wax, use non-skid floor wax.  Do not have throw rugs and other things on the floor that can make you trip. What can I do with my stairs?  Do not  leave any items on the stairs.  Make sure that there are handrails on both sides of the stairs and use them. Fix handrails that are broken or loose. Make sure that handrails are as long as the stairways.  Check any carpeting to make sure that it is firmly attached to the stairs. Fix any carpet that is loose or worn.  Avoid having throw rugs at the top or bottom of the stairs. If you do have throw rugs, attach them to the floor with carpet tape.  Make sure that you have a light switch at the top of the stairs and the bottom of the stairs. If you do not have them, ask someone to add them for you. What else can I do to help prevent falls?  Wear shoes that:  Do not have high heels.  Have rubber bottoms.  Are comfortable and fit you well.  Are closed at the toe. Do not wear sandals.  If you use a stepladder:  Make sure that it is fully opened. Do not climb a closed stepladder.  Make sure that both sides of the stepladder are locked into place.  Ask  someone to hold it for you, if possible.  Clearly mark and make sure that you can see:  Any grab bars or handrails.  First and last steps.  Where the edge of each step is.  Use tools that help you move around (mobility aids) if they are needed. These include:  Canes.  Walkers.  Scooters.  Crutches.  Turn on the lights when you go into a dark area. Replace any light bulbs as soon as they burn out.  Set up your furniture so you have a clear path. Avoid moving your furniture around.  If any of your floors are uneven, fix them.  If there are any pets around you, be aware of where they are.  Review your medicines with your doctor. Some medicines can make you feel dizzy. This can increase your chance of falling. Ask your doctor what other things that you can do to help prevent falls. This information is not intended to replace advice given to you by your health care provider. Make sure you discuss any questions you have with your health care provider. Document Released: 04/20/2009 Document Revised: 11/30/2015 Document Reviewed: 07/29/2014 Elsevier Interactive Patient Education  2017 [employer].

## 2020-11-21 NOTE — Telephone Encounter (Signed)
Noted  

## 2020-11-22 ENCOUNTER — Encounter: Payer: Self-pay | Admitting: Physical Medicine and Rehabilitation

## 2020-11-27 DIAGNOSIS — D631 Anemia in chronic kidney disease: Secondary | ICD-10-CM | POA: Diagnosis not present

## 2020-11-27 DIAGNOSIS — M4316 Spondylolisthesis, lumbar region: Secondary | ICD-10-CM | POA: Diagnosis not present

## 2020-11-27 DIAGNOSIS — N183 Chronic kidney disease, stage 3 unspecified: Secondary | ICD-10-CM | POA: Diagnosis not present

## 2020-11-27 DIAGNOSIS — M48062 Spinal stenosis, lumbar region with neurogenic claudication: Secondary | ICD-10-CM | POA: Diagnosis not present

## 2020-11-27 DIAGNOSIS — E1122 Type 2 diabetes mellitus with diabetic chronic kidney disease: Secondary | ICD-10-CM | POA: Diagnosis not present

## 2020-11-27 DIAGNOSIS — M5136 Other intervertebral disc degeneration, lumbar region: Secondary | ICD-10-CM | POA: Diagnosis not present

## 2020-11-30 ENCOUNTER — Ambulatory Visit (INDEPENDENT_AMBULATORY_CARE_PROVIDER_SITE_OTHER): Payer: Medicare Other

## 2020-11-30 DIAGNOSIS — E1169 Type 2 diabetes mellitus with other specified complication: Secondary | ICD-10-CM

## 2020-11-30 DIAGNOSIS — M1612 Unilateral primary osteoarthritis, left hip: Secondary | ICD-10-CM | POA: Diagnosis not present

## 2020-11-30 DIAGNOSIS — G8929 Other chronic pain: Secondary | ICD-10-CM

## 2020-11-30 NOTE — Patient Instructions (Signed)
Visit Information  PATIENT GOALS: Goals Addressed            This Visit's Progress   . Cope with Chronic Pain   Not on track    Timeframe:  Long-Range Goal Priority:  High Start Date:      09/28/20                       Expected End Date:         05/07/21            Follow Up Date 01/11/21    - learn relaxation techniques - practice acceptance of chronic pain - practice relaxation or meditation daily - think of new ways to do favorite things - use distraction techniques - use relaxation during pain    Why is this important?    Stress makes chronic pain feel worse.   Feelings like depression, anxiety, stress and anger can make your body more sensitive to pain.   Learning ways to cope with stress or depression may help you find some relief from the pain.     Notes:     Marland Kitchen Monitor and Manage My Blood Sugar-Diabetes Type 2   On track    Timeframe:  Long-Range Goal Priority:  Medium Start Date:      09/28/20                       Expected End Date:        05/07/21               Follow Up Date 01/11/2021    - check blood sugar at prescribed times - check blood sugar if I feel it is too high or too low - enter blood sugar readings and medication or insulin into daily log - take the blood sugar log to all doctor visits    Why is this important?    Checking your blood sugar at home helps to keep it from getting very high or very low.   Writing the results in a diary or log helps the doctor know how to care for you.   Your blood sugar log should have the time, date and the results.   Also, write down the amount of insulin or other medicine that you take.   Other information, like what you ate, exercise done and how you were feeling, will also be helpful.     Notes:        Patient verbalizes understanding of instructions provided today and agrees to view in [place].   Telephone follow up appointment with care management team member scheduled for: 01/11/21 at 5  AM  Peter Garter RN, [facility], CDE Care Management Coordinator [facility] [phone], Mobile [phone]

## 2020-11-30 NOTE — Chronic Care Management (AMB) (Signed)
Chronic Care Management   CCM RN Visit Note  11/30/2020 Name: [name] MRN: [id number] DOB: 04-18-1930  Subjective: [name] is a 87 y.o. year old female who is a primary care patient of Panosh, Standley Brooking, MD. The care management team was consulted for assistance with disease management and care coordination needs.    Engaged with patient by telephone for follow up visit in response to provider referral for case management and/or care coordination services.   Consent to Services:  The patient was given information about Chronic Care Management services, agreed to services, and gave verbal consent prior to initiation of services.  Please see initial visit note for detailed documentation.   Patient agreed to services and verbal consent obtained.   Assessment: Review of patient past medical history, allergies, medications, health status, including review of consultants reports, laboratory and other test data, was performed as part of comprehensive evaluation and provision of chronic care management services.   SDOH (Social Determinants of Health) assessments and interventions performed:    CCM Care Plan  Allergies  Allergen Reactions  . Phenergan [Promethazine]     Slurred speech, acted very confused  . Remeron [Mirtazapine] Itching    Outpatient Encounter Medications as of 11/30/2020  Medication Sig  . acetaminophen (TYLENOL) 500 MG tablet Take 500 mg by mouth every 6 (six) hours as needed for mild pain or moderate pain.   Marland Kitchen ALPRAZolam (XANAX) 1 MG tablet Take 0.5-1 tablets (0.5-1 mg total) by mouth at bedtime as needed for anxiety. Avoid regular use.  Marland Kitchen aspirin EC 81 MG tablet Take 81 mg by mouth daily at 12 noon.   Marland Kitchen atorvastatin (LIPITOR) 10 MG tablet Take 1 tablet (10 mg total) by mouth daily.  . Cholecalciferol (VITAMIN D) 50 MCG (2000 UT) tablet Take 2,000 Units by mouth daily.  . cyanocobalamin 1000 MCG tablet Take 1,000 mcg by mouth at bedtime.   . docusate  sodium (COLACE) 100 MG capsule Take 1 capsule (100 mg total) by mouth 2 (two) times daily.  Marland Kitchen esomeprazole (NEXIUM) 40 MG capsule Take 1 capsule (40 mg total) by mouth daily.  . furosemide (LASIX) 40 MG tablet TAKE 1 TABLET DAILY OR AS DIRECTED  . glipiZIDE (GLUCOTROL) 10 MG tablet Take 1 tablet (10 mg total) by mouth in the morning.  . naloxegol oxalate (MOVANTIK) 25 MG TABS tablet Take 1 tablet (25 mg total) by mouth daily.  Marland Kitchen senna-docusate (SENOKOT-S) 8.6-50 MG tablet Take 2 tablets by mouth 2 (two) times daily.  . traMADol (ULTRAM) 50 MG tablet Take 2 tablets (100 mg total) by mouth every 6 (six) hours as needed for moderate pain.  . Trospium Chloride 60 MG CP24 Take 1 capsule by mouth daily.  . Turmeric 500 MG CAPS Take by mouth daily.   No facility-administered encounter medications on file as of 11/30/2020.    Patient Active Problem List   Diagnosis Date Noted  . Peripheral edema 07/11/2020  . Sleep disturbance 06/12/2020  . Drug induced constipation   . Neurogenic bladder   . Constipation   . Hypoalbuminemia due to protein-calorie malnutrition ([place])   . Benign essential HTN   . Stage 3b chronic kidney disease ([place])   . Chronic pain syndrome   . Controlled type 2 diabetes mellitus with hyperglycemia, without long-term current use of insulin ([place])   . Lumbar radiculopathy 05/02/2020  . Spondylolisthesis of lumbar region 04/24/2020  . Swelling of left lower extremity 02/18/2020  . Urinary retention with incomplete  bladder emptying   . Hip pain   . Severe back pain   . AKI (acute kidney injury) ([place])   . Intractable pain/ left hip osteoarthritis 12/25/2019  . Spinal stenosis, lumbar region, with neurogenic claudication 03/06/2016  . Malignant neoplasm of right female breast ([place]) 01/11/2016  . Controlled type 2 diabetes mellitus without complication, without long-term current use of insulin ([place]) 01/11/2016  . Genetic testing 08/18/2015  . Family history of breast cancer   .  Family history of prostate cancer   . Family history of pancreatic cancer   . Breast cancer of upper-outer quadrant of right female breast ([place]) 07/21/2015  . Insomnia 05/06/2015  . Medication management 12/16/2013  . Hand arthritis 12/16/2013  . Rectal bleeding 12/16/2013  . Cold intolerance 06/17/2013  . Renal insufficiency 12/23/2012  . Post-menopause 12/23/2012  . Hyperlipidemia 12/23/2012  . Postmenopausal HRT (hormone replacement therapy) 12/23/2012  . History of shingles 12/23/2012  . Wears hearing aid 12/23/2012  . Nonspecific abnormal unspecified cardiovascular function study 02/28/2012  . Chronic sore throat 01/13/2012  . Hoarse 01/13/2012  . Chest pressure 12/03/2011  . Itching  mid back 12/03/2011  . Abnormal EKG 12/03/2011  . Medicare annual wellness visit, subsequent 12/03/2011  . DJD (degenerative joint disease) 12/03/2011  . DIABETES MELLITUS, TYPE II, CONTROLLED 11/15/2009  . VITAMIN D DEFICIENCY 11/15/2009  . EXOGENOUS OBESITY 11/15/2009  . ANXIETY DEPRESSION 12/22/2008  . HYPERTHYROIDISM 08/23/2008  . TOTAL KNEE REPLACEMENT, LEFT, HX OF 08/23/2008  . Hypothyroidism 10/08/2007  . ANEMIA 10/08/2007  . Osteoarthritis 10/08/2007  . OSTEOPENIA 10/08/2007  . Dyslipidemia 11/07/2006  . GERD (gastroesophageal reflux disease) 11/07/2006    Conditions to be addressed/monitored:DMII and chronic pain  Care Plan : RNCM:Diabetes Type 2 (Adult)  Updates made by Dimitri Ped, RN since 11/30/2020 12:00 AM    Problem: Glycemic Management (Diabetes, Type 2)   Priority: Medium    Long-Range Goal: Glycemic Management Optimized   Start Date: 09/28/2020  Expected End Date: 05/07/2021  This Visit's Progress: On track  Recent Progress: On track  Priority: Medium  Note:   Objective:  Lab Results  Component Value Date   HGBA1C 6.9 (H) 08/14/2020 .   Lab Results  Component Value Date   CREATININE 1.47 (H) 08/14/2020   CREATININE 1.25 (H) 06/14/2020   CREATININE  1.15 (H) 05/31/2020 .   Lab Results  Component Value Date   EGFR 32 (L) 07/26/2015 .   Current Barriers:  Marland Kitchen Knowledge Deficits related to basic Diabetes pathophysiology and self care/management . Knowledge Deficits related to medications used for management of diabetes . Unable to independently daughter assists with most IADLS, impaired mobility  . Does not adhere to provider recommendations re: exercise, diet . Unable to perform IADLs independently . Reports CBGs range from 97-130 in the morning. Denies any hypoglycemia. States she is eating healthy and trying to follow a low CHO, low sodium diet  Case Manager Clinical Goal(s):  . patient will demonstrate improved adherence to prescribed treatment plan for diabetes self care/management as evidenced by: daily monitoring and recording of CBG  adherence to ADA/ carb modified diet adherence to prescribed medication regimen contacting provider for new or worsened symptoms or questions Interventions:  . Collaboration with Panosh, Standley Brooking, MD regarding development and update of comprehensive plan of care as evidenced by provider attestation and co-signature . Inter-disciplinary care team collaboration (see longitudinal plan of care) . Reinforced education to patient about basic DM disease process . Reviewed medications with  patient and discussed importance of medication adherence . Discussed plans with patient for ongoing care management follow up and provided patient with direct contact information for care management team . Reviewed educational related to hypo and hyperglycemia and importance of correct treatment . Reviewed scheduled/upcoming provider appointments including: Dr. Regis Bill 12/19/20, Vascular 01/23/21  . Reinforced to check cbg daily and record, calling primary care provider for findings outside established parameters.   . Review of patient status, including review of consultants reports, relevant laboratory and other test results, and  medications completed. . Reinforced instructions to wear compression hose, to put on in the morning and remove at night, and to keep her legs elevated as much as possible Self-Care Activities - Self administers oral medications as prescribed Attends all scheduled provider appointments Checks blood sugars as prescribed and utilize hyper and hypoglycemia protocol as needed Adheres to prescribed ADA/carb modified Patient Goals: - check blood sugar at prescribed times - check blood sugar if I feel it is too high or too low - enter blood sugar readings and medication or insulin into daily log - take the blood sugar log to all doctor visits - drink 6 to 8 glasses of water each day - fill half of plate with vegetables - limit fast food meals to no more than 1 per week - read food labels for fat, fiber, carbohydrates and portion size - keep appointment with eye doctor - check feet daily for cuts, sores or redness - keep feet up while sitting - wear comfortable, well-fitting shoes - check blood sugar at prescribed times - check blood sugar if I feel it is too high or too low - enter blood sugar readings and medication or insulin into daily log - take the blood sugar log to all doctor visits Follow Up Plan: Telephone follow up appointment with care management team member scheduled for: 01/11/21 at 11 AM The patient has been provided with contact information for the care management team and has been advised to call with any health related questions or concerns.   Care Plan : RNCM:Chronic Pain (Adult)  Updates made by Dimitri Ped, RN since 11/30/2020 12:00 AM    Problem: Chronic Pain Management (Chronic Pain)   Priority: High    Long-Range Goal: Chronic Pain Managed   Start Date: 09/28/2020  Expected End Date: 05/07/2021  This Visit's Progress: Not on track  Recent Progress: On track  Priority: High  Note:   Current Barriers:  Marland Kitchen Knowledge Deficits related to self-health management of  chronic pain- lt hip osteoarthritis, degenerative disc disease.  Pt had back surgery 05/02/21, reports still weak and having issues with lt hip and leg pain, Currently getting home health PT, uses walker at all times, reports taking Tramadol at bedtime and Tylenol during the day for pain relief, Daughter assists pt with transportation and her care as needed.  Husband assists if needed and does some cooking- States that the other shot in her hip has not helped with her hip pain, states she is being sent back to her back surgeon to see if there is anything else that can be done, states her jerking is some better, states she saw the neurologist and they did not find any neurological reason for her jerking, states she is still getting PT and she tries to do the exercises they have given her . Chronic Disease Management support and education needs related to chronic pain . Knowledge Deficits related to self management of chronic pain . Chronic Disease  Management support and education needs related to self management of chronic pain . Unable to independently self manage chronic pain . Unable to perform IADLs independently Clinical Goal(s):  . patient will verbalize understanding of plan for pain management. , patient will attend all scheduled medical appointments: no primary care provider scheduled, vascular 10/23/20 , patient will demonstrate use of different relaxation  skills and/or diversional activities to assist with pain reduction (distraction, imagery, relaxation, massage, acupressure, TENS, heat, and cold application., patient will report pain at a level less than 3 to 4 on a 10-10 rating scale., patient will use pharmacological and nonpharmacological pain relief strategies as prescribed. , and patient will verbalize acceptable level of pain relief and ability to engage in desired activities Interventions:  . Collaboration with Panosh, Standley Brooking, MD regarding development and update of comprehensive plan of  care as evidenced by provider attestation and co-signature- . Pain assessment performed . Medications reviewed . Discussed plans with patient for ongoing care management follow up and provided patient with direct contact information for care management team . Evaluation of current treatment plan related to self management of chronic pain and patient's adherence to plan as established by provider. . Reinforced education to patient re: self management of chronic pain . Reviewed medications with patient and discussed use of pain medications . Reviewed scheduled/upcoming provider appointments including: Dr. Regis Bill 12/19/20 . Discussed plans with patient for ongoing care management follow up and provided patient with direct contact information for care management team . Encouraged to continue to do therapy exercises and to use heat and cold applications to help with her chronic pain Patient Goals/Self Care Activities:  . Will self-administer medications as prescribed . Will attend all scheduled provider appointments . Will call pharmacy for medication refills 7 days prior to needed refill date . Patient will calls provider office for new concerns or questions . - learn relaxation techniques . - practice acceptance of chronic pain . - practice relaxation or meditation daily . - think of new ways to do favorite things . - use distraction techniques . - use relaxation during pain Follow Up Plan: Telephone follow up appointment with care management team member scheduled for: 01/11/21 at 11 AM The patient has been provided with contact information for the care management team and has been advised to call with any health related questions or concerns.        Plan:Telephone follow up appointment with care management team member scheduled for:  01/11/21 and The patient has been provided with contact information for the care management team and has been advised to call with any health related questions or  concerns.  Peter Garter RN, Jackquline Denmark, CDE Care Management Coordinator [facility] [phone], Mobile [phone]

## 2020-12-05 DIAGNOSIS — M25552 Pain in left hip: Secondary | ICD-10-CM | POA: Diagnosis not present

## 2020-12-05 DIAGNOSIS — R030 Elevated blood-pressure reading, without diagnosis of hypertension: Secondary | ICD-10-CM | POA: Diagnosis not present

## 2020-12-05 DIAGNOSIS — M4316 Spondylolisthesis, lumbar region: Secondary | ICD-10-CM | POA: Diagnosis not present

## 2020-12-06 ENCOUNTER — Other Ambulatory Visit: Payer: Self-pay | Admitting: Family Medicine

## 2020-12-06 DIAGNOSIS — M5136 Other intervertebral disc degeneration, lumbar region: Secondary | ICD-10-CM | POA: Diagnosis not present

## 2020-12-06 DIAGNOSIS — N183 Chronic kidney disease, stage 3 unspecified: Secondary | ICD-10-CM | POA: Diagnosis not present

## 2020-12-06 DIAGNOSIS — D631 Anemia in chronic kidney disease: Secondary | ICD-10-CM | POA: Diagnosis not present

## 2020-12-06 DIAGNOSIS — E1122 Type 2 diabetes mellitus with diabetic chronic kidney disease: Secondary | ICD-10-CM | POA: Diagnosis not present

## 2020-12-06 DIAGNOSIS — M48062 Spinal stenosis, lumbar region with neurogenic claudication: Secondary | ICD-10-CM | POA: Diagnosis not present

## 2020-12-06 DIAGNOSIS — M4316 Spondylolisthesis, lumbar region: Secondary | ICD-10-CM | POA: Diagnosis not present

## 2020-12-07 NOTE — Telephone Encounter (Signed)
Patient's daughter informed of the message below  

## 2020-12-07 NOTE — Telephone Encounter (Signed)
I have not been prescribing this medicine on a regular basis, last time was February to take at nighting   small l amounts . Dr Sarajane Jews filled this new rx  in my absence on  May 6 at a higher dose   but dont see a visit related to this .  She is seeing specialist in regard to her back and  Hip pain and rehab  Dr Arnoldo Morale is her neuro  surgery .   Advise she  contact her specialists who are managing  pain   (ortho or neurosurgery team) for ongoing    Scrip for  Pain management . Can discuss at her upcoming visit if there is a change in plans .

## 2020-12-11 DIAGNOSIS — M48062 Spinal stenosis, lumbar region with neurogenic claudication: Secondary | ICD-10-CM | POA: Diagnosis not present

## 2020-12-11 DIAGNOSIS — M4316 Spondylolisthesis, lumbar region: Secondary | ICD-10-CM | POA: Diagnosis not present

## 2020-12-11 DIAGNOSIS — E1122 Type 2 diabetes mellitus with diabetic chronic kidney disease: Secondary | ICD-10-CM | POA: Diagnosis not present

## 2020-12-11 DIAGNOSIS — D631 Anemia in chronic kidney disease: Secondary | ICD-10-CM | POA: Diagnosis not present

## 2020-12-11 DIAGNOSIS — N183 Chronic kidney disease, stage 3 unspecified: Secondary | ICD-10-CM | POA: Diagnosis not present

## 2020-12-11 DIAGNOSIS — M5136 Other intervertebral disc degeneration, lumbar region: Secondary | ICD-10-CM | POA: Diagnosis not present

## 2020-12-15 DIAGNOSIS — E1122 Type 2 diabetes mellitus with diabetic chronic kidney disease: Secondary | ICD-10-CM | POA: Diagnosis not present

## 2020-12-15 DIAGNOSIS — Z853 Personal history of malignant neoplasm of breast: Secondary | ICD-10-CM | POA: Diagnosis not present

## 2020-12-15 DIAGNOSIS — Z87440 Personal history of urinary (tract) infections: Secondary | ICD-10-CM | POA: Diagnosis not present

## 2020-12-15 DIAGNOSIS — Z9841 Cataract extraction status, right eye: Secondary | ICD-10-CM | POA: Diagnosis not present

## 2020-12-15 DIAGNOSIS — E785 Hyperlipidemia, unspecified: Secondary | ICD-10-CM | POA: Diagnosis not present

## 2020-12-15 DIAGNOSIS — Z9181 History of falling: Secondary | ICD-10-CM | POA: Diagnosis not present

## 2020-12-15 DIAGNOSIS — Z79891 Long term (current) use of opiate analgesic: Secondary | ICD-10-CM | POA: Diagnosis not present

## 2020-12-15 DIAGNOSIS — N183 Chronic kidney disease, stage 3 unspecified: Secondary | ICD-10-CM | POA: Diagnosis not present

## 2020-12-15 DIAGNOSIS — M4316 Spondylolisthesis, lumbar region: Secondary | ICD-10-CM | POA: Diagnosis not present

## 2020-12-15 DIAGNOSIS — I4510 Unspecified right bundle-branch block: Secondary | ICD-10-CM | POA: Diagnosis not present

## 2020-12-15 DIAGNOSIS — N319 Neuromuscular dysfunction of bladder, unspecified: Secondary | ICD-10-CM | POA: Diagnosis not present

## 2020-12-15 DIAGNOSIS — Z9350 Unspecified cystostomy status: Secondary | ICD-10-CM | POA: Diagnosis not present

## 2020-12-15 DIAGNOSIS — Z9842 Cataract extraction status, left eye: Secondary | ICD-10-CM | POA: Diagnosis not present

## 2020-12-15 DIAGNOSIS — Z961 Presence of intraocular lens: Secondary | ICD-10-CM | POA: Diagnosis not present

## 2020-12-15 DIAGNOSIS — K219 Gastro-esophageal reflux disease without esophagitis: Secondary | ICD-10-CM | POA: Diagnosis not present

## 2020-12-15 DIAGNOSIS — K5790 Diverticulosis of intestine, part unspecified, without perforation or abscess without bleeding: Secondary | ICD-10-CM | POA: Diagnosis not present

## 2020-12-15 DIAGNOSIS — Z923 Personal history of irradiation: Secondary | ICD-10-CM | POA: Diagnosis not present

## 2020-12-15 DIAGNOSIS — M1990 Unspecified osteoarthritis, unspecified site: Secondary | ICD-10-CM | POA: Diagnosis not present

## 2020-12-15 DIAGNOSIS — M48062 Spinal stenosis, lumbar region with neurogenic claudication: Secondary | ICD-10-CM | POA: Diagnosis not present

## 2020-12-15 DIAGNOSIS — D631 Anemia in chronic kidney disease: Secondary | ICD-10-CM | POA: Diagnosis not present

## 2020-12-15 DIAGNOSIS — Z7984 Long term (current) use of oral hypoglycemic drugs: Secondary | ICD-10-CM | POA: Diagnosis not present

## 2020-12-15 DIAGNOSIS — Z7982 Long term (current) use of aspirin: Secondary | ICD-10-CM | POA: Diagnosis not present

## 2020-12-15 DIAGNOSIS — Z90710 Acquired absence of both cervix and uterus: Secondary | ICD-10-CM | POA: Diagnosis not present

## 2020-12-15 DIAGNOSIS — M5136 Other intervertebral disc degeneration, lumbar region: Secondary | ICD-10-CM | POA: Diagnosis not present

## 2020-12-15 DIAGNOSIS — Z981 Arthrodesis status: Secondary | ICD-10-CM | POA: Diagnosis not present

## 2020-12-18 ENCOUNTER — Other Ambulatory Visit: Payer: Self-pay

## 2020-12-18 DIAGNOSIS — N183 Chronic kidney disease, stage 3 unspecified: Secondary | ICD-10-CM | POA: Diagnosis not present

## 2020-12-18 DIAGNOSIS — E1122 Type 2 diabetes mellitus with diabetic chronic kidney disease: Secondary | ICD-10-CM | POA: Diagnosis not present

## 2020-12-18 DIAGNOSIS — M5136 Other intervertebral disc degeneration, lumbar region: Secondary | ICD-10-CM | POA: Diagnosis not present

## 2020-12-18 DIAGNOSIS — M4316 Spondylolisthesis, lumbar region: Secondary | ICD-10-CM | POA: Diagnosis not present

## 2020-12-18 DIAGNOSIS — M48062 Spinal stenosis, lumbar region with neurogenic claudication: Secondary | ICD-10-CM | POA: Diagnosis not present

## 2020-12-18 DIAGNOSIS — D631 Anemia in chronic kidney disease: Secondary | ICD-10-CM | POA: Diagnosis not present

## 2020-12-18 NOTE — Progress Notes (Signed)
Chief Complaint  Patient presents with   Medication Consultation     HPI: [name] 87 y.o. come in for evaluation for medication.  Medical conditions Pain degenerative disease status post surgery and left hip pain seeing neurosurgeon who is ordering an MRI of her hip she has an appointment with pain management this week to begin. She takes tramadol 50 mg at night that helps her jerking but not sure it helps her pain otherwise acetaminophen extra strength every 4-6 hours.  And LidoCream lidocaine patch. Sleep anxiety says she does not sleep sometimes will take a half of alprazolam but may sleep from 5 to late in the morning.  Diabetes controlled no lows reported. She also states that sometimes the end of her feet will turn reddish and have skin sensitivity but no lesions she is wearing compression stockings most days for swelling control. Recently for the last 2 weeks she has had an annoying postnasal drainage cough with clear nasal discharge no fever pain or regular illness she did not do a COVID test no shortness of breath or chest pain.  Has used cough drops.  Cough does not particularly interfere with sleep. ROS: See pertinent positives and negatives per HPI.  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 , HER2/neu negative-- s/p right lumpectomy (without SLNB) and radiation therapy   Chronic lower back pain    CKD (chronic kidney disease) stage 3, GFR 30-59 ml/min (HCC)    DDD (degenerative disc disease)    lumbar   Diverticulosis    Family history of breast cancer    Family history of pancreatic cancer    Family history of prostate cancer    Flaccid neuropathic bladder, not elsewhere classified    Foley catheter in place    GERD (gastroesophageal  reflux disease)    Hemorrhoids    Hiatal hernia    History of colon polyps    History of radiation therapy 09-12-2015 to 10-12-2015   42.72 gray in 16 fractions directed right breast w/ boost of 12 gray in 6 fractions directed at the lumpectomy cavity- Total dose: 54.72y   History of recurrent UTIs    Hyperlipidemia    PONV (postoperative nausea and vomiting)    RBBB (right bundle branch block with left anterior fascicular block)    Type 2 diabetes mellitus ([place])    Urinary retention with incomplete bladder emptying    Wears dentures    full upper and lower partial   Wears glasses    Wears hearing aid    bilateral but does not wear    Family History  Problem Relation Age of Onset   Pancreatic cancer Mother 88   Diabetes Mother    Prostate cancer Brother    Diabetes Father    Heart disease Father    Diabetes Brother    Heart attack Brother    Diabetes Sister    Diabetes Daughter    Diabetes Son    Coronary artery disease Brother        MI in his 47s   Colon cancer Neg Hx    Stomach cancer Neg Hx     Social History   Socioeconomic History   Marital status: Married  Spouse name: Nabeeha Badertscher   Number of children: 4   Years of education: 12   Highest education level: 12th grade  Occupational History   Occupation: Retired  Tobacco Use   Smoking status: Never   Smokeless tobacco: Never  Vaping Use   Vaping Use: Never used  Substance and Sexual Activity   Alcohol use: No   Drug use: No   Sexual activity: Never  Other Topics Concern   Not on file  Social History Narrative   HH 2 married   Works for [employer] for 40 years retired   No pets    Lives w spouse; married in 44; 32 years    Social Determinants of Radio broadcast assistant Strain: Low Risk    Difficulty of Paying Living Expenses: Not hard at all  Food Insecurity: No Food Insecurity   Worried About Charity fundraiser in the Last Year: Never true   Arboriculturist in the Last Year: Never  true  Transportation Needs: No Transportation Needs   Lack of Transportation (Medical): No   Lack of Transportation (Non-Medical): No  Physical Activity: Inactive   Days of Exercise per Week: 0 days   Minutes of Exercise per Session: 0 min  Stress: No Stress Concern Present   Feeling of Stress : Only a little  Social Connections: Moderately Isolated   Frequency of Communication with Friends and Family: More than three times a week   Frequency of Social Gatherings with Friends and Family: More than three times a week   Attends Religious Services: Never   Marine scientist or Organizations: No   Attends Archivist Meetings: Never   Marital Status: Married    Outpatient Medications Prior to Visit  Medication Sig Dispense Refill   acetaminophen (TYLENOL) 500 MG tablet Take 500 mg by mouth every 6 (six) hours as needed for mild pain or moderate pain.      ALPRAZolam (XANAX) 1 MG tablet Take 0.5-1 tablets (0.5-1 mg total) by mouth at bedtime as needed for anxiety. Avoid regular use. 20 tablet 0   aspirin EC 81 MG tablet Take 81 mg by mouth daily at 12 noon.      atorvastatin (LIPITOR) 10 MG tablet Take 1 tablet (10 mg total) by mouth daily. 90 tablet 1   Cholecalciferol (VITAMIN D) 50 MCG (2000 UT) tablet Take 2,000 Units by mouth daily.     cyanocobalamin 1000 MCG tablet Take 1,000 mcg by mouth at bedtime.      docusate sodium (COLACE) 100 MG capsule Take 1 capsule (100 mg total) by mouth 2 (two) times daily. 60 capsule 0   esomeprazole (NEXIUM) 40 MG capsule Take 1 capsule (40 mg total) by mouth daily. 90 capsule 1   furosemide (LASIX) 40 MG tablet TAKE 1 TABLET DAILY OR AS DIRECTED 90 tablet 0   glipiZIDE (GLUCOTROL) 10 MG tablet Take 1 tablet (10 mg total) by mouth in the morning. 90 tablet 3   naloxegol oxalate (MOVANTIK) 25 MG TABS tablet Take 1 tablet (25 mg total) by mouth daily. 30 tablet 1   senna-docusate (SENOKOT-S) 8.6-50 MG tablet Take 2 tablets by mouth 2 (two)  times daily. 120 tablet 2   traMADol (ULTRAM) 50 MG tablet Take 2 tablets (100 mg total) by mouth every 6 (six) hours as needed for moderate pain. 60 tablet 0   Trospium Chloride 60 MG CP24 Take 1 capsule by mouth daily.     Turmeric 500  MG CAPS Take by mouth daily.     No facility-administered medications prior to visit.     EXAM:  BP 130/60 (BP Location: Right Arm, Patient Position: Sitting, Cuff Size: Normal)   Pulse (!) 102   Temp 98.5 F (36.9 C) (Oral)   Ht 5' (1.524 m)   Wt 139 lb 3.2 oz (63.1 kg)   SpO2 94%   BMI 27.19 kg/m   Body mass index is 27.19 kg/m.  GENERAL: vitals reviewed and listed above, alert, oriented, appears well hydrated and in no acute distress HEENT: atraumatic, conjunctiva  clear, no obvious abnormalities on inspection of external nose and ears OP : Mast TMs are clear face nontender NECK: no obvious masses on inspection palpation  LUNGS: clear to auscultation bilaterally, no wheezes, rales or rhonchi,  CV: HRRR, no clubbing cyanosis or pression hose minimal edema nl cap refill  MS: moves all extremities assisted independent gait PSYCH: pleasant and cooperative, no obvious depression or anxiety normal speech and orientation. Lab Results  Component Value Date   WBC 5.1 08/14/2020   HGB 11.7 (L) 08/14/2020   HCT 35.7 (L) 08/14/2020   PLT 274.0 08/14/2020   GLUCOSE 94 08/14/2020   CHOL 180 08/31/2019   TRIG 323.0 (H) 08/31/2019   HDL 35.80 (L) 08/31/2019   LDLDIRECT 81.0 08/31/2019   LDLCALC 83 12/16/2013   ALT 8 08/14/2020   AST 12 08/14/2020   NA 140 08/14/2020   K 4.4 08/14/2020   CL 98 08/14/2020   CREATININE 1.47 (H) 08/14/2020   BUN 26 (H) 08/14/2020   CO2 36 (H) 08/14/2020   TSH 4.48 08/14/2020   INR 1.0 12/25/2019   HGBA1C 6.1 (A) 12/19/2020   MICROALBUR 10.3 (H) 08/31/2019   BP Readings from Last 3 Encounters:  12/19/20 130/60  11/03/20 (!) 134/56  11/02/20 130/71    ASSESSMENT AND PLAN:  Discussed the following  assessment and plan:  Type 2 diabetes mellitus with other specified complication, without long-term current use of insulin (HCC) - Plan: POCT glycosylated hemoglobin (Hb A1C)  Other chronic pain  Jerking  Left hip pain  Medication management  Sleep disturbance  Decreased mobility  History of back surgery Record review Cannot explain the jerking neurologic has no diagnosis using tramadol for the jerking in the evening advise she try taking me I vitamins during the day because it may not be causing jerking and she may be losing the benefit of eye vitamins.  Agree with seeing pain management to tell about all 3 pains and management of medicine. Not sure what can add about sleep would avoid regular use of benzo diazepam for reasons explained in the past.  Perhaps pain management will help her sleep. Diabetes is in control if she is getting any lows contact us for dosage adjustment. Plan follow-up visit in about 6 months or earlier depending. -Patient advised to return or notify health care team  if  new concerns arise. Record review time counseling follow-up 40 minutes. Patient Instructions  Have pain management address medication help.  Including hip pain back pain and ocass foot pain.  Not sure how to help the sleep.  Keep Dm controlled to avoid getting neuropathy pain.    Plan rov in 6 months or as needed  Hg a1c is 6.1   Mariann Laster K. Dearra Myhand M.D.

## 2020-12-19 ENCOUNTER — Encounter: Payer: Self-pay | Admitting: Internal Medicine

## 2020-12-19 ENCOUNTER — Ambulatory Visit (INDEPENDENT_AMBULATORY_CARE_PROVIDER_SITE_OTHER): Payer: Medicare Other | Admitting: Internal Medicine

## 2020-12-19 VITALS — BP 130/60 | HR 102 | Temp 98.5°F | Ht 60.0 in | Wt 139.2 lb

## 2020-12-19 DIAGNOSIS — Z79899 Other long term (current) drug therapy: Secondary | ICD-10-CM

## 2020-12-19 DIAGNOSIS — R2689 Other abnormalities of gait and mobility: Secondary | ICD-10-CM | POA: Diagnosis not present

## 2020-12-19 DIAGNOSIS — M25552 Pain in left hip: Secondary | ICD-10-CM

## 2020-12-19 DIAGNOSIS — G479 Sleep disorder, unspecified: Secondary | ICD-10-CM | POA: Diagnosis not present

## 2020-12-19 DIAGNOSIS — R253 Fasciculation: Secondary | ICD-10-CM | POA: Diagnosis not present

## 2020-12-19 DIAGNOSIS — E1169 Type 2 diabetes mellitus with other specified complication: Secondary | ICD-10-CM

## 2020-12-19 DIAGNOSIS — G8929 Other chronic pain: Secondary | ICD-10-CM

## 2020-12-19 DIAGNOSIS — Z9889 Other specified postprocedural states: Secondary | ICD-10-CM | POA: Diagnosis not present

## 2020-12-19 LAB — POCT GLYCOSYLATED HEMOGLOBIN (HGB A1C): Hemoglobin A1C: 6.1 % — AB (ref 4.0–5.6)

## 2020-12-19 NOTE — Patient Instructions (Addendum)
Have pain management address medication help.  Including hip pain back pain and ocass foot pain.  Not sure how to help the sleep.  Keep Dm controlled to avoid getting neuropathy pain.    Plan rov in 6 months or as needed  Hg a1c is 6.1

## 2020-12-20 DIAGNOSIS — M48062 Spinal stenosis, lumbar region with neurogenic claudication: Secondary | ICD-10-CM | POA: Diagnosis not present

## 2020-12-21 DIAGNOSIS — M25552 Pain in left hip: Secondary | ICD-10-CM | POA: Diagnosis not present

## 2020-12-21 DIAGNOSIS — M4316 Spondylolisthesis, lumbar region: Secondary | ICD-10-CM | POA: Diagnosis not present

## 2020-12-21 DIAGNOSIS — M545 Low back pain, unspecified: Secondary | ICD-10-CM | POA: Diagnosis not present

## 2020-12-21 DIAGNOSIS — S73192A Other sprain of left hip, initial encounter: Secondary | ICD-10-CM | POA: Diagnosis not present

## 2020-12-25 DIAGNOSIS — M4316 Spondylolisthesis, lumbar region: Secondary | ICD-10-CM | POA: Diagnosis not present

## 2020-12-25 DIAGNOSIS — E1122 Type 2 diabetes mellitus with diabetic chronic kidney disease: Secondary | ICD-10-CM | POA: Diagnosis not present

## 2020-12-25 DIAGNOSIS — M48062 Spinal stenosis, lumbar region with neurogenic claudication: Secondary | ICD-10-CM | POA: Diagnosis not present

## 2020-12-25 DIAGNOSIS — N183 Chronic kidney disease, stage 3 unspecified: Secondary | ICD-10-CM | POA: Diagnosis not present

## 2020-12-25 DIAGNOSIS — M5136 Other intervertebral disc degeneration, lumbar region: Secondary | ICD-10-CM | POA: Diagnosis not present

## 2020-12-25 DIAGNOSIS — D631 Anemia in chronic kidney disease: Secondary | ICD-10-CM | POA: Diagnosis not present

## 2020-12-29 DIAGNOSIS — N312 Flaccid neuropathic bladder, not elsewhere classified: Secondary | ICD-10-CM | POA: Diagnosis not present

## 2021-01-01 DIAGNOSIS — E1122 Type 2 diabetes mellitus with diabetic chronic kidney disease: Secondary | ICD-10-CM | POA: Diagnosis not present

## 2021-01-01 DIAGNOSIS — M5136 Other intervertebral disc degeneration, lumbar region: Secondary | ICD-10-CM | POA: Diagnosis not present

## 2021-01-01 DIAGNOSIS — N183 Chronic kidney disease, stage 3 unspecified: Secondary | ICD-10-CM | POA: Diagnosis not present

## 2021-01-01 DIAGNOSIS — M48062 Spinal stenosis, lumbar region with neurogenic claudication: Secondary | ICD-10-CM | POA: Diagnosis not present

## 2021-01-01 DIAGNOSIS — D631 Anemia in chronic kidney disease: Secondary | ICD-10-CM | POA: Diagnosis not present

## 2021-01-01 DIAGNOSIS — M4316 Spondylolisthesis, lumbar region: Secondary | ICD-10-CM | POA: Diagnosis not present

## 2021-01-03 DIAGNOSIS — M48062 Spinal stenosis, lumbar region with neurogenic claudication: Secondary | ICD-10-CM | POA: Diagnosis not present

## 2021-01-03 DIAGNOSIS — M961 Postlaminectomy syndrome, not elsewhere classified: Secondary | ICD-10-CM | POA: Diagnosis not present

## 2021-01-10 DIAGNOSIS — M4316 Spondylolisthesis, lumbar region: Secondary | ICD-10-CM | POA: Diagnosis not present

## 2021-01-10 DIAGNOSIS — M5136 Other intervertebral disc degeneration, lumbar region: Secondary | ICD-10-CM | POA: Diagnosis not present

## 2021-01-10 DIAGNOSIS — D631 Anemia in chronic kidney disease: Secondary | ICD-10-CM | POA: Diagnosis not present

## 2021-01-10 DIAGNOSIS — N183 Chronic kidney disease, stage 3 unspecified: Secondary | ICD-10-CM | POA: Diagnosis not present

## 2021-01-10 DIAGNOSIS — Z23 Encounter for immunization: Secondary | ICD-10-CM | POA: Diagnosis not present

## 2021-01-10 DIAGNOSIS — M48062 Spinal stenosis, lumbar region with neurogenic claudication: Secondary | ICD-10-CM | POA: Diagnosis not present

## 2021-01-10 DIAGNOSIS — E1122 Type 2 diabetes mellitus with diabetic chronic kidney disease: Secondary | ICD-10-CM | POA: Diagnosis not present

## 2021-01-11 ENCOUNTER — Ambulatory Visit (INDEPENDENT_AMBULATORY_CARE_PROVIDER_SITE_OTHER): Payer: Medicare Other

## 2021-01-11 DIAGNOSIS — N1832 Chronic kidney disease, stage 3b: Secondary | ICD-10-CM

## 2021-01-11 DIAGNOSIS — M1612 Unilateral primary osteoarthritis, left hip: Secondary | ICD-10-CM | POA: Diagnosis not present

## 2021-01-11 DIAGNOSIS — G8929 Other chronic pain: Secondary | ICD-10-CM

## 2021-01-11 DIAGNOSIS — E1169 Type 2 diabetes mellitus with other specified complication: Secondary | ICD-10-CM

## 2021-01-11 NOTE — Patient Instructions (Signed)
Visit Information  PATIENT GOALS:  Goals Addressed             This Visit's Progress    RNCM:Cope with Chronic Pain   On track    Timeframe:  Long-Range Goal Priority:  High Start Date:      09/28/20                       Expected End Date:         05/07/21            Follow Up Date 03/01/21    - learn relaxation techniques - practice acceptance of chronic pain - practice relaxation or meditation daily - think of new ways to do favorite things - use distraction techniques - use relaxation during pain  -keep appointment with pain management    Why is this important?   Stress makes chronic pain feel worse.  Feelings like depression, anxiety, stress and anger can make your body more sensitive to pain.  Learning ways to cope with stress or depression may help you find some relief from the pain.     Notes:       RNCM:Monitor and Manage My Blood Sugar-Diabetes Type 2   On track    Timeframe:  Long-Range Goal Priority:  Medium Start Date:      09/28/20                       Expected End Date:        05/07/21               Follow Up Date 03/01/2021    - check blood sugar at prescribed times - check blood sugar if I feel it is too high or too low - enter blood sugar readings and medication or insulin into daily log - take the blood sugar log to all doctor visits    Why is this important?   Checking your blood sugar at home helps to keep it from getting very high or very low.  Writing the results in a diary or log helps the doctor know how to care for you.  Your blood sugar log should have the time, date and the results.  Also, write down the amount of insulin or other medicine that you take.  Other information, like what you ate, exercise done and how you were feeling, will also be helpful.     Notes:        Constipation, Adult Constipation is when a person has trouble pooping (having a bowel movement). When you have this condition, you may poop fewer than 3 times a  week. Your poop (stool) may also be dry, hard, or bigger than normal. Follow these instructions at home: Eating and drinking  Eat foods that have a lot of fiber, such as: Fresh fruits and vegetables. Whole grains. Beans. Eat less of foods that are low in fiber and high in fat and sugar, such as: [place] fries. Hamburgers. Cookies. Candy. Soda. Drink enough fluid to keep your pee (urine) pale yellow.  General instructions Exercise regularly or as told by your doctor. Try to do 150 minutes of exercise each week. Go to the restroom when you feel like you need to poop. Do not hold it in. Take over-the-counter and prescription medicines only as told by your doctor. These include any fiber supplements. When you poop: Do deep breathing while relaxing your lower belly (abdomen). Relax your pelvic  floor. The pelvic floor is a group of muscles that support the rectum, bladder, and intestines (as well as the uterus in women). Watch your condition for any changes. Tell your doctor if you notice any. Keep all follow-up visits as told by your doctor. This is important. Contact a doctor if: You have pain that gets worse. You have a fever. You have not pooped for 4 days. You vomit. You are not hungry. You lose weight. You are bleeding from the opening of the butt (anus). You have thin, pencil-like poop. Get help right away if: You have a fever, and your symptoms suddenly get worse. You leak poop or have blood in your poop. Your belly feels hard or bigger than normal (bloated). You have very bad belly pain. You feel dizzy or you faint. Summary Constipation is when a person poops fewer than 3 times a week, has trouble pooping, or has poop that is dry, hard, or bigger than normal. Eat foods that have a lot of fiber. Drink enough fluid to keep your pee (urine) pale yellow. Take over-the-counter and prescription medicines only as told by your doctor. These include any fiber supplements. This  information is not intended to replace advice given to you by your health care provider. Make sure you discuss any questions you have with your healthcare provider. Document Revised: 05/12/2019 Document Reviewed: 05/12/2019 Elsevier Patient Education  2022 [place].   Patient verbalizes understanding of instructions provided today and agrees to view in [place].   Telephone follow up appointment with care management team member scheduled for: 03/01/21 at 11:30AM  Peter Garter RN, [facility], CDE Care Management Coordinator [place] [phone], Mobile [phone]

## 2021-01-11 NOTE — Chronic Care Management (AMB) (Signed)
Chronic Care Management   CCM RN Visit Note  01/11/2021 Name: [name] MRN: [id number] DOB: [id number]  Subjective: [name] is a 87 y.o. year old female who is a primary care patient of Panosh, Standley Brooking, MD. The care management team was consulted for assistance with disease management and care coordination needs.    Engaged with patient by telephone for follow up visit in response to provider referral for case management and/or care coordination services.   Consent to Services:  The patient was given information about Chronic Care Management services, agreed to services, and gave verbal consent prior to initiation of services.  Please see initial visit note for detailed documentation.   Patient agreed to services and verbal consent obtained.   Assessment: Review of patient past medical history, allergies, medications, health status, including review of consultants reports, laboratory and other test data, was performed as part of comprehensive evaluation and provision of chronic care management services.   SDOH (Social Determinants of Health) assessments and interventions performed:    CCM Care Plan  Allergies  Allergen Reactions   Phenergan [Promethazine]     Slurred speech, acted very confused   Remeron [Mirtazapine] Itching    Outpatient Encounter Medications as of 01/11/2021  Medication Sig   acetaminophen (TYLENOL) 500 MG tablet Take 500 mg by mouth every 6 (six) hours as needed for mild pain or moderate pain.    ALPRAZolam (XANAX) 1 MG tablet Take 0.5-1 tablets (0.5-1 mg total) by mouth at bedtime as needed for anxiety. Avoid regular use.   aspirin EC 81 MG tablet Take 81 mg by mouth daily at 12 noon.    atorvastatin (LIPITOR) 10 MG tablet Take 1 tablet (10 mg total) by mouth daily.   Cholecalciferol (VITAMIN D) 50 MCG (2000 UT) tablet Take 2,000 Units by mouth daily.   cyanocobalamin 1000 MCG tablet Take 1,000 mcg by mouth at bedtime.    docusate sodium (COLACE)  100 MG capsule Take 1 capsule (100 mg total) by mouth 2 (two) times daily.   esomeprazole (NEXIUM) 40 MG capsule Take 1 capsule (40 mg total) by mouth daily.   furosemide (LASIX) 40 MG tablet TAKE 1 TABLET DAILY OR AS DIRECTED   glipiZIDE (GLUCOTROL) 10 MG tablet Take 1 tablet (10 mg total) by mouth in the morning.   naloxegol oxalate (MOVANTIK) 25 MG TABS tablet Take 1 tablet (25 mg total) by mouth daily.   senna-docusate (SENOKOT-S) 8.6-50 MG tablet Take 2 tablets by mouth 2 (two) times daily.   traMADol (ULTRAM) 50 MG tablet Take 2 tablets (100 mg total) by mouth every 6 (six) hours as needed for moderate pain.   Trospium Chloride 60 MG CP24 Take 1 capsule by mouth daily.   Turmeric 500 MG CAPS Take by mouth daily.   No facility-administered encounter medications on file as of 01/11/2021.    Patient Active Problem List   Diagnosis Date Noted   Peripheral edema 07/11/2020   Sleep disturbance 06/12/2020   Drug induced constipation    Neurogenic bladder    Constipation    Hypoalbuminemia due to protein-calorie malnutrition (HCC)    Benign essential HTN    Stage 3b chronic kidney disease (HCC)    Chronic pain syndrome    Controlled type 2 diabetes mellitus with hyperglycemia, without long-term current use of insulin (HCC)    Lumbar radiculopathy 05/02/2020   Spondylolisthesis of lumbar region 04/24/2020   Swelling of left lower extremity 02/18/2020   Urinary retention with incomplete  bladder emptying    Hip pain    Severe back pain    AKI (acute kidney injury) ([place])    Intractable pain/ left hip osteoarthritis 12/25/2019   Spinal stenosis, lumbar region, with neurogenic claudication 03/06/2016   Malignant neoplasm of right female breast ([place]) 01/11/2016   Controlled type 2 diabetes mellitus without complication, without long-term current use of insulin ([place]) 01/11/2016   Genetic testing 08/18/2015   Family history of breast cancer    Family history of prostate cancer    Family  history of pancreatic cancer    Breast cancer of upper-outer quadrant of right female breast ([place]) 07/21/2015   Insomnia 05/06/2015   Medication management 12/16/2013   Hand arthritis 12/16/2013   Rectal bleeding 12/16/2013   Cold intolerance 06/17/2013   Renal insufficiency 12/23/2012   Post-menopause 12/23/2012   Hyperlipidemia 12/23/2012   Postmenopausal HRT (hormone replacement therapy) 12/23/2012   History of shingles 12/23/2012   Wears hearing aid 12/23/2012   Nonspecific abnormal unspecified cardiovascular function study 02/28/2012   Chronic sore throat 01/13/2012   Hoarse 01/13/2012   Chest pressure 12/03/2011   Itching  mid back 12/03/2011   Abnormal EKG 12/03/2011   Medicare annual wellness visit, subsequent 12/03/2011   DJD (degenerative joint disease) 12/03/2011   DIABETES MELLITUS, TYPE II, CONTROLLED 11/15/2009   VITAMIN D DEFICIENCY 11/15/2009   EXOGENOUS OBESITY 11/15/2009   ANXIETY DEPRESSION 12/22/2008   HYPERTHYROIDISM 08/23/2008   TOTAL KNEE REPLACEMENT, LEFT, HX OF 08/23/2008   Hypothyroidism 10/08/2007   ANEMIA 10/08/2007   Osteoarthritis 10/08/2007   OSTEOPENIA 10/08/2007   Dyslipidemia 11/07/2006   GERD (gastroesophageal reflux disease) 11/07/2006    Conditions to be addressed/monitored:HLD, DMII, CKD Stage 3, and chronic pain  Care Plan : RNCM:Diabetes Type 2 (Adult)  Updates made by Dimitri Ped, RN since 01/11/2021 12:00 AM     Problem: Glycemic Management (Diabetes, Type 2)   Priority: Medium     Long-Range Goal: Glycemic Management Optimized   Start Date: 09/28/2020  Expected End Date: 05/07/2021  This Visit's Progress: On track  Recent Progress: On track  Priority: Medium  Note:   Objective:  Lab Results  Component Value Date   HGBA1C 6.9 (H) 08/14/2020   Lab Results  Component Value Date   CREATININE 1.47 (H) 08/14/2020   CREATININE 1.25 (H) 06/14/2020   CREATININE 1.15 (H) 05/31/2020   Lab Results  Component Value Date    EGFR 32 (L) 07/26/2015   Current Barriers:  Knowledge Deficits related to basic Diabetes pathophysiology and self care/management Knowledge Deficits related to medications used for management of diabetes Unable to independently daughter assists with most IADLS, impaired mobility  Does not adhere to provider recommendations re: exercise, diet Unable to perform IADLs independently Reports CBGs range from 97-133 in the morning but she has not been checking it regularly.  Denies any hypoglycemia.  States her Hemoglobin A1C was 6.1% when she saw Dr. Regis Bill last month States she is eating healthy and trying to follow a low CHO, low sodium diet  Case Manager Clinical Goal(s):  patient will demonstrate improved adherence to prescribed treatment plan for diabetes self care/management as evidenced by: daily monitoring and recording of CBG  adherence to ADA/ carb modified diet adherence to prescribed medication regimen contacting provider for new or worsened symptoms or questions Interventions:  Collaboration with Panosh, Standley Brooking, MD regarding development and update of comprehensive plan of care as evidenced by provider attestation and co-signature Inter-disciplinary care team collaboration (see longitudinal  plan of care) Reinforced education to patient about basic DM disease process Reviewed medications with patient and discussed importance of medication adherence Discussed plans with patient for ongoing care management follow up and provided patient with direct contact information for care management team Reinforced instructions on hypoglycemia  and hyperglycemia and importance of correct treatment Reviewed importance of eating when taking glipizide to prevent hypoglycemia Reviewed scheduled/upcoming provider appointments including: Dr. Regis Bill 06/20/21  Reinforced to check cbg daily and record, calling primary care provider for findings outside established parameters.   Review of patient status,  including review of consultants reports, relevant laboratory and other test results, and medications completed. Reinforced instructions to wear compression hose, to put on in the morning and remove at night, and to keep her legs elevated as much as possible Self-Care Activities - Self administers oral medications as prescribed Attends all scheduled provider appointments Checks blood sugars as prescribed and utilize hyper and hypoglycemia protocol as needed Adheres to prescribed ADA/carb modified Patient Goals: - check blood sugar at prescribed times - check blood sugar if I feel it is too high or too low - enter blood sugar readings and medication or insulin into daily log - take the blood sugar log to all doctor visits - drink 6 to 8 glasses of water each day - fill half of plate with vegetables - limit fast food meals to no more than 1 per week - read food labels for fat, fiber, carbohydrates and portion size - keep appointment with eye doctor - check feet daily for cuts, sores or redness - keep feet up while sitting - wear comfortable, well-fitting shoes - check blood sugar at prescribed times - check blood sugar if I feel it is too high or too low - enter blood sugar readings and medication or insulin into daily log - take the blood sugar log to all doctor visits Follow Up Plan: Telephone follow up appointment with care management team member scheduled for: 03/01/21 at 11 AM The patient has been provided with contact information for the care management team and has been advised to call with any health related questions or concerns.    Care Plan : RNCM:Chronic Pain (Adult)  Updates made by Dimitri Ped, RN since 01/11/2021 12:00 AM     Problem: Chronic Pain Management (Chronic Pain)   Priority: High     Long-Range Goal: Chronic Pain Managed   Start Date: 09/28/2020  Expected End Date: 05/07/2021  This Visit's Progress: On track  Recent Progress: Not on track  Priority:  High  Note:   Current Barriers:  Knowledge Deficits related to self-health management of chronic pain- lt hip osteoarthritis, degenerative disc disease.  Pt had back surgery 05/02/21, reports still weak and having issues with lt hip and leg pain, Currently getting home health PT, uses walker at all times, reports taking Tramadol at bedtime and Tylenol during the day for pain relief, Daughter assists pt with transportation and her care as needed.  Husband assists if needed and does some cooking- States that she had an MRI that showed her hip pain is from arthritis in her hip and she might need hip surgery.  States she has been to the pain doctors and she is to get another shot in her hip to see if that will help with her pain.  States she does not want to get surgery if she does not need it.  Denies any falls.  States she is taking Miralax as needed for constipation Chronic  Disease Management support and education needs related to chronic pain Knowledge Deficits related to self management of chronic pain Chronic Disease Management support and education needs related to self management of chronic pain Unable to independently self manage chronic pain Unable to perform IADLs independently Clinical Goal(s):  patient will verbalize understanding of plan for pain management. , patient will attend all scheduled medical appointments: Dr.Panosh 06/20/21, patient will demonstrate use of different relaxation  skills and/or diversional activities to assist with pain reduction (distraction, imagery, relaxation, massage, acupressure, TENS, heat, and cold application., patient will report pain at a level less than 3 to 4 on a 10-10 rating scale., patient will use pharmacological and nonpharmacological pain relief strategies as prescribed. , and patient will verbalize acceptable level of pain relief and ability to engage in desired activities Interventions:  Collaboration with Panosh, Standley Brooking, MD regarding development  and update of comprehensive plan of care as evidenced by provider attestation and co-signature- Pain assessment performed Medications reviewed Discussed plans with patient for ongoing care management follow up and provided patient with direct contact information for care management team Evaluation of current treatment plan related to self management of chronic pain and patient's adherence to plan as established by provider. Reinforced education to patient re: self management of chronic pain Reviewed medications with patient and discussed use of pain medications Reviewed scheduled/upcoming provider appointments including: Dr. Regis Bill 06/20/21 Discussed plans with patient for ongoing care management follow up and provided patient with direct contact information for care management team Encouraged to continue to do therapy exercises and to use heat and cold applications to help with her chronic pain Reviewed importance of avoiding constipation by use of medications, eating more fiber and drinking adequate amounts of fluids Patient Goals/Self Care Activities:  Will self-administer medications as prescribed Will attend all scheduled provider appointments Will call pharmacy for medication refills 7 days prior to needed refill date Patient will calls provider office for new concerns or questions - learn relaxation techniques - practice acceptance of chronic pain - practice relaxation or meditation daily - think of new ways to do favorite things - use distraction techniques - use relaxation during pain Follow Up Plan: Telephone follow up appointment with care management team member scheduled for: 03/01/21 at 11 AM The patient has been provided with contact information for the care management team and has been advised to call with any health related questions or concerns.        Plan:Telephone follow up appointment with care management team member scheduled for:  03/01/21 and The patient has been  provided with contact information for the care management team and has been advised to call with any health related questions or concerns.  Peter Garter RN, [facility], CDE Care Management Coordinator [facility] [phone], Mobile [phone]

## 2021-01-14 DIAGNOSIS — D631 Anemia in chronic kidney disease: Secondary | ICD-10-CM | POA: Diagnosis not present

## 2021-01-14 DIAGNOSIS — Z9841 Cataract extraction status, right eye: Secondary | ICD-10-CM | POA: Diagnosis not present

## 2021-01-14 DIAGNOSIS — Z7984 Long term (current) use of oral hypoglycemic drugs: Secondary | ICD-10-CM | POA: Diagnosis not present

## 2021-01-14 DIAGNOSIS — Z981 Arthrodesis status: Secondary | ICD-10-CM | POA: Diagnosis not present

## 2021-01-14 DIAGNOSIS — M5136 Other intervertebral disc degeneration, lumbar region: Secondary | ICD-10-CM | POA: Diagnosis not present

## 2021-01-14 DIAGNOSIS — N183 Chronic kidney disease, stage 3 unspecified: Secondary | ICD-10-CM | POA: Diagnosis not present

## 2021-01-14 DIAGNOSIS — K5790 Diverticulosis of intestine, part unspecified, without perforation or abscess without bleeding: Secondary | ICD-10-CM | POA: Diagnosis not present

## 2021-01-14 DIAGNOSIS — K219 Gastro-esophageal reflux disease without esophagitis: Secondary | ICD-10-CM | POA: Diagnosis not present

## 2021-01-14 DIAGNOSIS — N319 Neuromuscular dysfunction of bladder, unspecified: Secondary | ICD-10-CM | POA: Diagnosis not present

## 2021-01-14 DIAGNOSIS — Z79891 Long term (current) use of opiate analgesic: Secondary | ICD-10-CM | POA: Diagnosis not present

## 2021-01-14 DIAGNOSIS — Z9181 History of falling: Secondary | ICD-10-CM | POA: Diagnosis not present

## 2021-01-14 DIAGNOSIS — E785 Hyperlipidemia, unspecified: Secondary | ICD-10-CM | POA: Diagnosis not present

## 2021-01-14 DIAGNOSIS — Z961 Presence of intraocular lens: Secondary | ICD-10-CM | POA: Diagnosis not present

## 2021-01-14 DIAGNOSIS — E1122 Type 2 diabetes mellitus with diabetic chronic kidney disease: Secondary | ICD-10-CM | POA: Diagnosis not present

## 2021-01-14 DIAGNOSIS — Z923 Personal history of irradiation: Secondary | ICD-10-CM | POA: Diagnosis not present

## 2021-01-14 DIAGNOSIS — Z9350 Unspecified cystostomy status: Secondary | ICD-10-CM | POA: Diagnosis not present

## 2021-01-14 DIAGNOSIS — Z853 Personal history of malignant neoplasm of breast: Secondary | ICD-10-CM | POA: Diagnosis not present

## 2021-01-14 DIAGNOSIS — Z9842 Cataract extraction status, left eye: Secondary | ICD-10-CM | POA: Diagnosis not present

## 2021-01-14 DIAGNOSIS — I4510 Unspecified right bundle-branch block: Secondary | ICD-10-CM | POA: Diagnosis not present

## 2021-01-14 DIAGNOSIS — Z87440 Personal history of urinary (tract) infections: Secondary | ICD-10-CM | POA: Diagnosis not present

## 2021-01-14 DIAGNOSIS — M4316 Spondylolisthesis, lumbar region: Secondary | ICD-10-CM | POA: Diagnosis not present

## 2021-01-14 DIAGNOSIS — Z7982 Long term (current) use of aspirin: Secondary | ICD-10-CM | POA: Diagnosis not present

## 2021-01-14 DIAGNOSIS — M1990 Unspecified osteoarthritis, unspecified site: Secondary | ICD-10-CM | POA: Diagnosis not present

## 2021-01-14 DIAGNOSIS — Z90710 Acquired absence of both cervix and uterus: Secondary | ICD-10-CM | POA: Diagnosis not present

## 2021-01-14 DIAGNOSIS — M48062 Spinal stenosis, lumbar region with neurogenic claudication: Secondary | ICD-10-CM | POA: Diagnosis not present

## 2021-01-17 ENCOUNTER — Telehealth: Payer: Self-pay | Admitting: Internal Medicine

## 2021-01-17 DIAGNOSIS — N183 Chronic kidney disease, stage 3 unspecified: Secondary | ICD-10-CM | POA: Diagnosis not present

## 2021-01-17 DIAGNOSIS — D631 Anemia in chronic kidney disease: Secondary | ICD-10-CM | POA: Diagnosis not present

## 2021-01-17 DIAGNOSIS — M48062 Spinal stenosis, lumbar region with neurogenic claudication: Secondary | ICD-10-CM | POA: Diagnosis not present

## 2021-01-17 DIAGNOSIS — E1122 Type 2 diabetes mellitus with diabetic chronic kidney disease: Secondary | ICD-10-CM | POA: Diagnosis not present

## 2021-01-17 DIAGNOSIS — M5136 Other intervertebral disc degeneration, lumbar region: Secondary | ICD-10-CM | POA: Diagnosis not present

## 2021-01-17 DIAGNOSIS — M4316 Spondylolisthesis, lumbar region: Secondary | ICD-10-CM | POA: Diagnosis not present

## 2021-01-17 NOTE — Telephone Encounter (Signed)
[name] is calling in to get verbal orders to extend [facility] PT  1 week 9.  May leave a detail msg on the secured line.

## 2021-01-17 NOTE — Telephone Encounter (Signed)
Okay to extend OT PT

## 2021-01-18 NOTE — Telephone Encounter (Signed)
Left a detailed message on verified voice mail informing Vida Roller that Dr. Regis Bill has okayed these orders.

## 2021-01-23 ENCOUNTER — Ambulatory Visit: Payer: Medicare Other

## 2021-01-23 ENCOUNTER — Encounter (HOSPITAL_COMMUNITY): Payer: Medicare Other

## 2021-01-24 DIAGNOSIS — Z7984 Long term (current) use of oral hypoglycemic drugs: Secondary | ICD-10-CM

## 2021-01-24 DIAGNOSIS — M48062 Spinal stenosis, lumbar region with neurogenic claudication: Secondary | ICD-10-CM | POA: Diagnosis not present

## 2021-01-24 DIAGNOSIS — M1990 Unspecified osteoarthritis, unspecified site: Secondary | ICD-10-CM | POA: Diagnosis not present

## 2021-01-24 DIAGNOSIS — Z79891 Long term (current) use of opiate analgesic: Secondary | ICD-10-CM

## 2021-01-24 DIAGNOSIS — M5136 Other intervertebral disc degeneration, lumbar region: Secondary | ICD-10-CM | POA: Diagnosis not present

## 2021-01-24 DIAGNOSIS — E785 Hyperlipidemia, unspecified: Secondary | ICD-10-CM | POA: Diagnosis not present

## 2021-01-24 DIAGNOSIS — Z9181 History of falling: Secondary | ICD-10-CM

## 2021-01-24 DIAGNOSIS — Z7982 Long term (current) use of aspirin: Secondary | ICD-10-CM

## 2021-01-24 DIAGNOSIS — Z923 Personal history of irradiation: Secondary | ICD-10-CM

## 2021-01-24 DIAGNOSIS — K219 Gastro-esophageal reflux disease without esophagitis: Secondary | ICD-10-CM | POA: Diagnosis not present

## 2021-01-24 DIAGNOSIS — I4510 Unspecified right bundle-branch block: Secondary | ICD-10-CM | POA: Diagnosis not present

## 2021-01-24 DIAGNOSIS — E1122 Type 2 diabetes mellitus with diabetic chronic kidney disease: Secondary | ICD-10-CM | POA: Diagnosis not present

## 2021-01-24 DIAGNOSIS — K5790 Diverticulosis of intestine, part unspecified, without perforation or abscess without bleeding: Secondary | ICD-10-CM | POA: Diagnosis not present

## 2021-01-24 DIAGNOSIS — D631 Anemia in chronic kidney disease: Secondary | ICD-10-CM | POA: Diagnosis not present

## 2021-01-24 DIAGNOSIS — Z853 Personal history of malignant neoplasm of breast: Secondary | ICD-10-CM

## 2021-01-24 DIAGNOSIS — Z9842 Cataract extraction status, left eye: Secondary | ICD-10-CM

## 2021-01-24 DIAGNOSIS — Z981 Arthrodesis status: Secondary | ICD-10-CM | POA: Diagnosis not present

## 2021-01-24 DIAGNOSIS — N319 Neuromuscular dysfunction of bladder, unspecified: Secondary | ICD-10-CM | POA: Diagnosis not present

## 2021-01-24 DIAGNOSIS — M4316 Spondylolisthesis, lumbar region: Secondary | ICD-10-CM | POA: Diagnosis not present

## 2021-01-24 DIAGNOSIS — Z87440 Personal history of urinary (tract) infections: Secondary | ICD-10-CM

## 2021-01-24 DIAGNOSIS — Z9350 Unspecified cystostomy status: Secondary | ICD-10-CM

## 2021-01-24 DIAGNOSIS — Z9841 Cataract extraction status, right eye: Secondary | ICD-10-CM

## 2021-01-24 DIAGNOSIS — Z961 Presence of intraocular lens: Secondary | ICD-10-CM

## 2021-01-24 DIAGNOSIS — Z90710 Acquired absence of both cervix and uterus: Secondary | ICD-10-CM

## 2021-01-24 DIAGNOSIS — N183 Chronic kidney disease, stage 3 unspecified: Secondary | ICD-10-CM | POA: Diagnosis not present

## 2021-01-30 DIAGNOSIS — M961 Postlaminectomy syndrome, not elsewhere classified: Secondary | ICD-10-CM | POA: Diagnosis not present

## 2021-01-31 DIAGNOSIS — M48062 Spinal stenosis, lumbar region with neurogenic claudication: Secondary | ICD-10-CM | POA: Diagnosis not present

## 2021-01-31 DIAGNOSIS — E1122 Type 2 diabetes mellitus with diabetic chronic kidney disease: Secondary | ICD-10-CM | POA: Diagnosis not present

## 2021-01-31 DIAGNOSIS — M4316 Spondylolisthesis, lumbar region: Secondary | ICD-10-CM | POA: Diagnosis not present

## 2021-01-31 DIAGNOSIS — N183 Chronic kidney disease, stage 3 unspecified: Secondary | ICD-10-CM | POA: Diagnosis not present

## 2021-01-31 DIAGNOSIS — D631 Anemia in chronic kidney disease: Secondary | ICD-10-CM | POA: Diagnosis not present

## 2021-01-31 DIAGNOSIS — M5136 Other intervertebral disc degeneration, lumbar region: Secondary | ICD-10-CM | POA: Diagnosis not present

## 2021-02-05 DIAGNOSIS — M5136 Other intervertebral disc degeneration, lumbar region: Secondary | ICD-10-CM | POA: Diagnosis not present

## 2021-02-05 DIAGNOSIS — M4316 Spondylolisthesis, lumbar region: Secondary | ICD-10-CM | POA: Diagnosis not present

## 2021-02-05 DIAGNOSIS — N183 Chronic kidney disease, stage 3 unspecified: Secondary | ICD-10-CM | POA: Diagnosis not present

## 2021-02-05 DIAGNOSIS — M48062 Spinal stenosis, lumbar region with neurogenic claudication: Secondary | ICD-10-CM | POA: Diagnosis not present

## 2021-02-05 DIAGNOSIS — E1122 Type 2 diabetes mellitus with diabetic chronic kidney disease: Secondary | ICD-10-CM | POA: Diagnosis not present

## 2021-02-05 DIAGNOSIS — D631 Anemia in chronic kidney disease: Secondary | ICD-10-CM | POA: Diagnosis not present

## 2021-02-12 DIAGNOSIS — N312 Flaccid neuropathic bladder, not elsewhere classified: Secondary | ICD-10-CM | POA: Diagnosis not present

## 2021-02-13 DIAGNOSIS — Z961 Presence of intraocular lens: Secondary | ICD-10-CM | POA: Diagnosis not present

## 2021-02-13 DIAGNOSIS — M48062 Spinal stenosis, lumbar region with neurogenic claudication: Secondary | ICD-10-CM | POA: Diagnosis not present

## 2021-02-13 DIAGNOSIS — Z9841 Cataract extraction status, right eye: Secondary | ICD-10-CM | POA: Diagnosis not present

## 2021-02-13 DIAGNOSIS — K219 Gastro-esophageal reflux disease without esophagitis: Secondary | ICD-10-CM | POA: Diagnosis not present

## 2021-02-13 DIAGNOSIS — K5790 Diverticulosis of intestine, part unspecified, without perforation or abscess without bleeding: Secondary | ICD-10-CM | POA: Diagnosis not present

## 2021-02-13 DIAGNOSIS — Z7982 Long term (current) use of aspirin: Secondary | ICD-10-CM | POA: Diagnosis not present

## 2021-02-13 DIAGNOSIS — E1122 Type 2 diabetes mellitus with diabetic chronic kidney disease: Secondary | ICD-10-CM | POA: Diagnosis not present

## 2021-02-13 DIAGNOSIS — M1990 Unspecified osteoarthritis, unspecified site: Secondary | ICD-10-CM | POA: Diagnosis not present

## 2021-02-13 DIAGNOSIS — Z9181 History of falling: Secondary | ICD-10-CM | POA: Diagnosis not present

## 2021-02-13 DIAGNOSIS — D631 Anemia in chronic kidney disease: Secondary | ICD-10-CM | POA: Diagnosis not present

## 2021-02-13 DIAGNOSIS — Z7984 Long term (current) use of oral hypoglycemic drugs: Secondary | ICD-10-CM | POA: Diagnosis not present

## 2021-02-13 DIAGNOSIS — M5136 Other intervertebral disc degeneration, lumbar region: Secondary | ICD-10-CM | POA: Diagnosis not present

## 2021-02-13 DIAGNOSIS — M4316 Spondylolisthesis, lumbar region: Secondary | ICD-10-CM | POA: Diagnosis not present

## 2021-02-13 DIAGNOSIS — Z87440 Personal history of urinary (tract) infections: Secondary | ICD-10-CM | POA: Diagnosis not present

## 2021-02-13 DIAGNOSIS — N319 Neuromuscular dysfunction of bladder, unspecified: Secondary | ICD-10-CM | POA: Diagnosis not present

## 2021-02-13 DIAGNOSIS — Z79891 Long term (current) use of opiate analgesic: Secondary | ICD-10-CM | POA: Diagnosis not present

## 2021-02-13 DIAGNOSIS — Z9350 Unspecified cystostomy status: Secondary | ICD-10-CM | POA: Diagnosis not present

## 2021-02-13 DIAGNOSIS — Z9842 Cataract extraction status, left eye: Secondary | ICD-10-CM | POA: Diagnosis not present

## 2021-02-13 DIAGNOSIS — I4510 Unspecified right bundle-branch block: Secondary | ICD-10-CM | POA: Diagnosis not present

## 2021-02-13 DIAGNOSIS — Z981 Arthrodesis status: Secondary | ICD-10-CM | POA: Diagnosis not present

## 2021-02-13 DIAGNOSIS — E785 Hyperlipidemia, unspecified: Secondary | ICD-10-CM | POA: Diagnosis not present

## 2021-02-13 DIAGNOSIS — N183 Chronic kidney disease, stage 3 unspecified: Secondary | ICD-10-CM | POA: Diagnosis not present

## 2021-02-13 DIAGNOSIS — Z853 Personal history of malignant neoplasm of breast: Secondary | ICD-10-CM | POA: Diagnosis not present

## 2021-02-13 DIAGNOSIS — Z90710 Acquired absence of both cervix and uterus: Secondary | ICD-10-CM | POA: Diagnosis not present

## 2021-02-13 DIAGNOSIS — Z923 Personal history of irradiation: Secondary | ICD-10-CM | POA: Diagnosis not present

## 2021-02-14 DIAGNOSIS — N183 Chronic kidney disease, stage 3 unspecified: Secondary | ICD-10-CM | POA: Diagnosis not present

## 2021-02-14 DIAGNOSIS — E1122 Type 2 diabetes mellitus with diabetic chronic kidney disease: Secondary | ICD-10-CM | POA: Diagnosis not present

## 2021-02-14 DIAGNOSIS — M4316 Spondylolisthesis, lumbar region: Secondary | ICD-10-CM | POA: Diagnosis not present

## 2021-02-14 DIAGNOSIS — M5136 Other intervertebral disc degeneration, lumbar region: Secondary | ICD-10-CM | POA: Diagnosis not present

## 2021-02-14 DIAGNOSIS — D631 Anemia in chronic kidney disease: Secondary | ICD-10-CM | POA: Diagnosis not present

## 2021-02-14 DIAGNOSIS — M48062 Spinal stenosis, lumbar region with neurogenic claudication: Secondary | ICD-10-CM | POA: Diagnosis not present

## 2021-02-16 DIAGNOSIS — M4316 Spondylolisthesis, lumbar region: Secondary | ICD-10-CM | POA: Diagnosis not present

## 2021-02-19 ENCOUNTER — Other Ambulatory Visit: Payer: Self-pay | Admitting: Internal Medicine

## 2021-02-19 DIAGNOSIS — M5136 Other intervertebral disc degeneration, lumbar region: Secondary | ICD-10-CM | POA: Diagnosis not present

## 2021-02-19 DIAGNOSIS — N183 Chronic kidney disease, stage 3 unspecified: Secondary | ICD-10-CM | POA: Diagnosis not present

## 2021-02-19 DIAGNOSIS — M4316 Spondylolisthesis, lumbar region: Secondary | ICD-10-CM | POA: Diagnosis not present

## 2021-02-19 DIAGNOSIS — E1122 Type 2 diabetes mellitus with diabetic chronic kidney disease: Secondary | ICD-10-CM | POA: Diagnosis not present

## 2021-02-19 DIAGNOSIS — M48062 Spinal stenosis, lumbar region with neurogenic claudication: Secondary | ICD-10-CM | POA: Diagnosis not present

## 2021-02-19 DIAGNOSIS — D631 Anemia in chronic kidney disease: Secondary | ICD-10-CM | POA: Diagnosis not present

## 2021-02-26 DIAGNOSIS — E1122 Type 2 diabetes mellitus with diabetic chronic kidney disease: Secondary | ICD-10-CM | POA: Diagnosis not present

## 2021-02-26 DIAGNOSIS — M48062 Spinal stenosis, lumbar region with neurogenic claudication: Secondary | ICD-10-CM | POA: Diagnosis not present

## 2021-02-26 DIAGNOSIS — M5136 Other intervertebral disc degeneration, lumbar region: Secondary | ICD-10-CM | POA: Diagnosis not present

## 2021-02-26 DIAGNOSIS — N183 Chronic kidney disease, stage 3 unspecified: Secondary | ICD-10-CM | POA: Diagnosis not present

## 2021-02-26 DIAGNOSIS — M4316 Spondylolisthesis, lumbar region: Secondary | ICD-10-CM | POA: Diagnosis not present

## 2021-02-26 DIAGNOSIS — D631 Anemia in chronic kidney disease: Secondary | ICD-10-CM | POA: Diagnosis not present

## 2021-03-01 ENCOUNTER — Ambulatory Visit (INDEPENDENT_AMBULATORY_CARE_PROVIDER_SITE_OTHER): Payer: Medicare Other

## 2021-03-01 DIAGNOSIS — N1832 Chronic kidney disease, stage 3b: Secondary | ICD-10-CM

## 2021-03-01 DIAGNOSIS — M1990 Unspecified osteoarthritis, unspecified site: Secondary | ICD-10-CM | POA: Diagnosis not present

## 2021-03-01 DIAGNOSIS — E1169 Type 2 diabetes mellitus with other specified complication: Secondary | ICD-10-CM

## 2021-03-01 DIAGNOSIS — M1612 Unilateral primary osteoarthritis, left hip: Secondary | ICD-10-CM

## 2021-03-01 NOTE — Chronic Care Management (AMB) (Signed)
Chronic Care Management   CCM RN Visit Note  03/01/2021 Name: [name] MRN: [id number] DOB: [birth year]  Subjective: [name] is a 87 y.o. year old female who is a primary care patient of Panosh, Standley Brooking, MD. The care management team was consulted for assistance with disease management and care coordination needs.    Engaged with patient by telephone for follow up visit in response to provider referral for case management and/or care coordination services.   Consent to Services:  The patient was given information about Chronic Care Management services, agreed to services, and gave verbal consent prior to initiation of services.  Please see initial visit note for detailed documentation.   Patient agreed to services and verbal consent obtained.   Assessment: Review of patient past medical history, allergies, medications, health status, including review of consultants reports, laboratory and other test data, was performed as part of comprehensive evaluation and provision of chronic care management services.   SDOH (Social Determinants of Health) assessments and interventions performed:    CCM Care Plan  Allergies  Allergen Reactions   Phenergan [Promethazine]     Slurred speech, acted very confused   Remeron [Mirtazapine] Itching    Outpatient Encounter Medications as of 03/01/2021  Medication Sig   acetaminophen (TYLENOL) 500 MG tablet Take 500 mg by mouth every 6 (six) hours as needed for mild pain or moderate pain.    ALPRAZolam (XANAX) 1 MG tablet Take 0.5-1 tablets (0.5-1 mg total) by mouth at bedtime as needed for anxiety. Avoid regular use.   aspirin EC 81 MG tablet Take 81 mg by mouth daily at 12 noon.    atorvastatin (LIPITOR) 10 MG tablet Take 1 tablet (10 mg total) by mouth daily.   Cholecalciferol (VITAMIN D) 50 MCG (2000 UT) tablet Take 2,000 Units by mouth daily.   cyanocobalamin 1000 MCG tablet Take 1,000 mcg by mouth at bedtime.    docusate sodium  (COLACE) 100 MG capsule Take 1 capsule (100 mg total) by mouth 2 (two) times daily.   esomeprazole (NEXIUM) 40 MG capsule Take 1 capsule (40 mg total) by mouth daily.   furosemide (LASIX) 40 MG tablet TAKE 1 TABLET DAILY OR AS DIRECTED   glipiZIDE (GLUCOTROL) 10 MG tablet Take 1 tablet (10 mg total) by mouth in the morning.   naloxegol oxalate (MOVANTIK) 25 MG TABS tablet Take 1 tablet (25 mg total) by mouth daily.   senna-docusate (SENOKOT-S) 8.6-50 MG tablet Take 2 tablets by mouth 2 (two) times daily.   traMADol (ULTRAM) 50 MG tablet Take 2 tablets (100 mg total) by mouth every 6 (six) hours as needed for moderate pain.   Trospium Chloride 60 MG CP24 Take 1 capsule by mouth daily.   Turmeric 500 MG CAPS Take by mouth daily.   No facility-administered encounter medications on file as of 03/01/2021.    Patient Active Problem List   Diagnosis Date Noted   Peripheral edema 07/11/2020   Sleep disturbance 06/12/2020   Drug induced constipation    Neurogenic bladder    Constipation    Hypoalbuminemia due to protein-calorie malnutrition (HCC)    Benign essential HTN    Stage 3b chronic kidney disease (HCC)    Chronic pain syndrome    Controlled type 2 diabetes mellitus with hyperglycemia, without long-term current use of insulin (HCC)    Lumbar radiculopathy 05/02/2020   Spondylolisthesis of lumbar region 04/24/2020   Swelling of left lower extremity 02/18/2020   Urinary retention with incomplete  bladder emptying    Hip pain    Severe back pain    AKI (acute kidney injury) ([place])    Intractable pain/ left hip osteoarthritis 12/25/2019   Spinal stenosis, lumbar region, with neurogenic claudication 03/06/2016   Malignant neoplasm of right female breast ([place]) 01/11/2016   Controlled type 2 diabetes mellitus without complication, without long-term current use of insulin ([place]) 01/11/2016   Genetic testing 08/18/2015   Family history of breast cancer    Family history of prostate cancer     Family history of pancreatic cancer    Breast cancer of upper-outer quadrant of right female breast ([place]) 07/21/2015   Insomnia 05/06/2015   Medication management 12/16/2013   Hand arthritis 12/16/2013   Rectal bleeding 12/16/2013   Cold intolerance 06/17/2013   Renal insufficiency 12/23/2012   Post-menopause 12/23/2012   Hyperlipidemia 12/23/2012   Postmenopausal HRT (hormone replacement therapy) 12/23/2012   History of shingles 12/23/2012   Wears hearing aid 12/23/2012   Nonspecific abnormal unspecified cardiovascular function study 02/28/2012   Chronic sore throat 01/13/2012   Hoarse 01/13/2012   Chest pressure 12/03/2011   Itching  mid back 12/03/2011   Abnormal EKG 12/03/2011   Medicare annual wellness visit, subsequent 12/03/2011   DJD (degenerative joint disease) 12/03/2011   DIABETES MELLITUS, TYPE II, CONTROLLED 11/15/2009   VITAMIN D DEFICIENCY 11/15/2009   EXOGENOUS OBESITY 11/15/2009   ANXIETY DEPRESSION 12/22/2008   HYPERTHYROIDISM 08/23/2008   TOTAL KNEE REPLACEMENT, LEFT, HX OF 08/23/2008   Hypothyroidism 10/08/2007   ANEMIA 10/08/2007   Osteoarthritis 10/08/2007   OSTEOPENIA 10/08/2007   Dyslipidemia 11/07/2006   GERD (gastroesophageal reflux disease) 11/07/2006    Conditions to be addressed/monitored:HTN, DMII, and chronic pain  Care Plan : RNCM:Diabetes Type 2 (Adult)  Updates made by Dimitri Ped, RN since 03/01/2021 12:00 AM     Problem: Glycemic Management (Diabetes, Type 2)   Priority: Medium     Long-Range Goal: Glycemic Management Optimized   Start Date: 09/28/2020  Expected End Date: 05/07/2021  This Visit's Progress: On track  Recent Progress: On track  Priority: Medium  Note:   Objective:  Lab Results  Component Value Date   HGBA1C 6.9 (H) 08/14/2020   Lab Results  Component Value Date   CREATININE 1.47 (H) 08/14/2020   CREATININE 1.25 (H) 06/14/2020   CREATININE 1.15 (H) 05/31/2020   Lab Results  Component Value Date    EGFR 32 (L) 07/26/2015   Current Barriers:  Knowledge Deficits related to basic Diabetes pathophysiology and self care/management Knowledge Deficits related to medications used for management of diabetes Unable to independently daughter assists with most IADLS, impaired mobility  Does not adhere to provider recommendations re: exercise, diet Unable to perform IADLs independently States that she had an episode of feeling clammy last week in the afternoon.  States she checked her CBG and it was 59.  States she ate some jelly beans and drank some sweet tea.  States she felt better after eating.  Denies any change in what she eats or her activity that day.  States her CBG was 117 the next morning States she is eating healthy and trying to follow a low CHO, low sodium diet  Case Manager Clinical Goal(s):  patient will demonstrate improved adherence to prescribed treatment plan for diabetes self care/management as evidenced by: daily monitoring and recording of CBG  adherence to ADA/ carb modified diet adherence to prescribed medication regimen contacting provider for new or worsened symptoms or questions Interventions:  Collaboration with Panosh, Standley Brooking, MD regarding development and update of comprehensive plan of care as evidenced by provider attestation and co-signature Inter-disciplinary care team collaboration (see longitudinal plan of care) Reinforced education to patient about basic DM disease process Reviewed medications with patient and discussed importance of medication adherence Discussed plans with patient for ongoing care management follow up and provided patient with direct contact information for care management team Reinforced instructions on hypoglycemia  and hyperglycemia and importance of correct treatment Reviewed scheduled/upcoming provider appointments including: Dr. Regis Bill 06/20/21  Reinforced to check cbg daily and record, calling primary care provider for findings outside  established parameters.   Review of patient status, including review of consultants reports, relevant laboratory and other test results, and medications completed. Reinforced instructions to wear compression hose, to put on in the morning and remove at night, and to keep her legs elevated as much as possible Reviewed the rule of 15 to treat hypoglycemia.  Reviewed to notify her doctor if she has frequent episodes of hypoglycemia. Reviewed how her glipizide can cause hypoglycemia is she does not eat enough or if she is more active than usual  Self-Care Activities - Self administers oral medications as prescribed Attends all scheduled provider appointments Checks blood sugars as prescribed and utilize hyper and hypoglycemia protocol as needed Adheres to prescribed ADA/carb modified Patient Goals: - check blood sugar at prescribed times - check blood sugar if I feel it is too high or too low - enter blood sugar readings and medication or insulin into daily log - take the blood sugar log to all doctor visits - drink 6 to 8 glasses of water each day - fill half of plate with vegetables - limit fast food meals to no more than 1 per week - read food labels for fat, fiber, carbohydrates and portion size - keep appointment with eye doctor - check feet daily for cuts, sores or redness - keep feet up while sitting - wear comfortable, well-fitting shoes - check blood sugar at prescribed times - check blood sugar if I feel it is too high or too low - enter blood sugar readings and medication or insulin into daily log - take the blood sugar log to all doctor visits Follow Up Plan: Telephone follow up appointment with care management team member scheduled for: 04/10/21/22 at 11:30 AM The patient has been provided with contact information for the care management team and has been advised to call with any health related questions or concerns.    Care Plan : RNCM:Chronic Pain (Adult)  Updates made by  Dimitri Ped, RN since 03/01/2021 12:00 AM     Problem: Chronic Pain Management (Chronic Pain)   Priority: High     Long-Range Goal: Chronic Pain Managed   Start Date: 09/28/2020  Expected End Date: 05/07/2021  This Visit's Progress: On track  Recent Progress: On track  Priority: High  Note:   Current Barriers:  Knowledge Deficits related to self-health management of chronic pain- lt hip osteoarthritis, degenerative disc disease.  Pt had back surgery 05/02/20, reports still weak and having issues with lt hip and leg pain,  uses walker at all times, reports taking Tramadol at bedtime and Tylenol during the day for pain relief, Daughter assists pt with transportation and her care as needed.  Husband assists if needed and does some cooking- States she is to see pain doctor tomorrow and will discuss if she needs to have hip surgery.  States she got an injection in  her lt hip that helped with the pain for about 2 weeks.    States she does not want to get surgery if she does not need it.  Denies any falls.  States she is taking Miralax as needed for constipation. States she is still  getting home health PT once a week and she tries to do the exercises every day Chronic Disease Management support and education needs related to chronic pain Knowledge Deficits related to self management of chronic pain Chronic Disease Management support and education needs related to self management of chronic pain Unable to independently self manage chronic pain Unable to perform IADLs independently Clinical Goal(s):  patient will verbalize understanding of plan for pain management. , patient will attend all scheduled medical appointments: Dr.Panosh 06/20/21, patient will demonstrate use of different relaxation  skills and/or diversional activities to assist with pain reduction (distraction, imagery, relaxation, massage, acupressure, TENS, heat, and cold application., patient will report pain at a level less than 3  to 4 on a 10-10 rating scale., patient will use pharmacological and nonpharmacological pain relief strategies as prescribed. , and patient will verbalize acceptable level of pain relief and ability to engage in desired activities Interventions:  Collaboration with Panosh, Standley Brooking, MD regarding development and update of comprehensive plan of care as evidenced by provider attestation and co-signature- Pain assessment performed Medications reviewed Discussed plans with patient for ongoing care management follow up and provided patient with direct contact information for care management team Evaluation of current treatment plan related to self management of chronic pain and patient's adherence to plan as established by provider. Reinforced education to patient re: self management of chronic pain Reviewed medications with patient and discussed use of pain medications Reviewed scheduled/upcoming provider appointments including: Dr. Regis Bill 06/20/21 Discussed plans with patient for ongoing care management follow up and provided patient with direct contact information for care management team Reinforced to continue to do therapy exercises and to use heat and cold applications to help with her chronic pain Reinforced importance of avoiding constipation by use of medications, eating more fiber and drinking adequate amounts of fluids Patient Goals/Self Care Activities:  Will self-administer medications as prescribed Will attend all scheduled provider appointments Will call pharmacy for medication refills 7 days prior to needed refill date Patient will calls provider office for new concerns or questions - learn relaxation techniques - practice acceptance of chronic pain - practice relaxation or meditation daily - think of new ways to do favorite things - use distraction techniques - use relaxation during pain Follow Up Plan: Telephone follow up appointment with care management team member scheduled for:  04/10/21 at 11:30  AM The patient has been provided with contact information for the care management team and has been advised to call with any health related questions or concerns.        Plan:Telephone follow up appointment with care management team member scheduled for:  04/10/21 and The patient has been provided with contact information for the care management team and has been advised to call with any health related questions or concerns.  Peter Garter RN, Jackquline Denmark, CDE Care Management Coordinator [facility] [phone], Mobile [phone]

## 2021-03-01 NOTE — Patient Instructions (Signed)
Visit Information  PATIENT GOALS:  Goals Addressed             This Visit's Progress    RNCM:Cope with Chronic Pain   On track    Timeframe:  Long-Range Goal Priority:  High Start Date:      09/28/20                       Expected End Date:         05/07/21            Follow Up Date 04/10/21    - learn relaxation techniques - practice acceptance of chronic pain - practice relaxation or meditation daily - think of new ways to do favorite things - use distraction techniques - use relaxation during pain  -keep appointment with pain management    Why is this important?   Stress makes chronic pain feel worse.  Feelings like depression, anxiety, stress and anger can make your body more sensitive to pain.  Learning ways to cope with stress or depression may help you find some relief from the pain.     Notes:      RNCM:Monitor and Manage My Blood Sugar-Diabetes Type 2   On track    Timeframe:  Long-Range Goal Priority:  Medium Start Date:      09/28/20                       Expected End Date:        05/07/21               Follow Up Date 04/10/2021    - check blood sugar at prescribed times - check blood sugar if I feel it is too high or too low - enter blood sugar readings and medication or insulin into daily log - take the blood sugar log to all doctor visits    Why is this important?   Checking your blood sugar at home helps to keep it from getting very high or very low.  Writing the results in a diary or log helps the doctor know how to care for you.  Your blood sugar log should have the time, date and the results.  Also, write down the amount of insulin or other medicine that you take.  Other information, like what you ate, exercise done and how you were feeling, will also be helpful.     Notes:         Patient verbalizes understanding of instructions provided today and agrees to view in [place].   Telephone follow up appointment with care management team member  scheduled for: 04/10/21 at 11:30 AM  Peter Garter RN, Jackquline Denmark, CDE Care Management Coordinator [facility] [phone], Mobile [phone]

## 2021-03-02 DIAGNOSIS — M1612 Unilateral primary osteoarthritis, left hip: Secondary | ICD-10-CM | POA: Diagnosis not present

## 2021-03-02 DIAGNOSIS — M48062 Spinal stenosis, lumbar region with neurogenic claudication: Secondary | ICD-10-CM | POA: Diagnosis not present

## 2021-03-02 DIAGNOSIS — M961 Postlaminectomy syndrome, not elsewhere classified: Secondary | ICD-10-CM | POA: Diagnosis not present

## 2021-03-05 DIAGNOSIS — N183 Chronic kidney disease, stage 3 unspecified: Secondary | ICD-10-CM | POA: Diagnosis not present

## 2021-03-05 DIAGNOSIS — D631 Anemia in chronic kidney disease: Secondary | ICD-10-CM | POA: Diagnosis not present

## 2021-03-05 DIAGNOSIS — M4316 Spondylolisthesis, lumbar region: Secondary | ICD-10-CM | POA: Diagnosis not present

## 2021-03-05 DIAGNOSIS — E1122 Type 2 diabetes mellitus with diabetic chronic kidney disease: Secondary | ICD-10-CM | POA: Diagnosis not present

## 2021-03-05 DIAGNOSIS — M5136 Other intervertebral disc degeneration, lumbar region: Secondary | ICD-10-CM | POA: Diagnosis not present

## 2021-03-05 DIAGNOSIS — M48062 Spinal stenosis, lumbar region with neurogenic claudication: Secondary | ICD-10-CM | POA: Diagnosis not present

## 2021-03-14 DIAGNOSIS — E1122 Type 2 diabetes mellitus with diabetic chronic kidney disease: Secondary | ICD-10-CM | POA: Diagnosis not present

## 2021-03-14 DIAGNOSIS — M48062 Spinal stenosis, lumbar region with neurogenic claudication: Secondary | ICD-10-CM | POA: Diagnosis not present

## 2021-03-14 DIAGNOSIS — D631 Anemia in chronic kidney disease: Secondary | ICD-10-CM | POA: Diagnosis not present

## 2021-03-14 DIAGNOSIS — M4316 Spondylolisthesis, lumbar region: Secondary | ICD-10-CM | POA: Diagnosis not present

## 2021-03-14 DIAGNOSIS — M5136 Other intervertebral disc degeneration, lumbar region: Secondary | ICD-10-CM | POA: Diagnosis not present

## 2021-03-14 DIAGNOSIS — N183 Chronic kidney disease, stage 3 unspecified: Secondary | ICD-10-CM | POA: Diagnosis not present

## 2021-03-26 DIAGNOSIS — R338 Other retention of urine: Secondary | ICD-10-CM | POA: Diagnosis not present

## 2021-04-02 ENCOUNTER — Other Ambulatory Visit: Payer: Self-pay | Admitting: Internal Medicine

## 2021-04-10 ENCOUNTER — Ambulatory Visit (INDEPENDENT_AMBULATORY_CARE_PROVIDER_SITE_OTHER): Payer: Medicare Other

## 2021-04-10 DIAGNOSIS — E1169 Type 2 diabetes mellitus with other specified complication: Secondary | ICD-10-CM

## 2021-04-10 DIAGNOSIS — E7800 Pure hypercholesterolemia, unspecified: Secondary | ICD-10-CM

## 2021-04-10 DIAGNOSIS — M1612 Unilateral primary osteoarthritis, left hip: Secondary | ICD-10-CM

## 2021-04-10 DIAGNOSIS — G8929 Other chronic pain: Secondary | ICD-10-CM

## 2021-04-10 NOTE — Patient Instructions (Signed)
Visit Information  PATIENT GOALS:  Goals Addressed             This Visit's Progress    RNCM:Cope with Chronic Pain   On track    Timeframe:  Long-Range Goal Priority:  High Start Date:      09/28/20                       Expected End Date:         09/05/21           Follow Up Date 05/22/21    - learn relaxation techniques - practice acceptance of chronic pain - practice relaxation or meditation daily - think of new ways to do favorite things - use distraction techniques - use relaxation during pain  -keep appointment with pain management    Why is this important?   Stress makes chronic pain feel worse.  Feelings like depression, anxiety, stress and anger can make your body more sensitive to pain.  Learning ways to cope with stress or depression may help you find some relief from the pain.     Notes:      RNCM:Monitor and Manage My Blood Sugar-Diabetes Type 2   On track    Timeframe:  Long-Range Goal Priority:  Medium Start Date:      09/28/20                       Expected End Date:        09/05/21              Follow Up Date 05/22/21   - check blood sugar at prescribed times - check blood sugar if I feel it is too high or too low - enter blood sugar readings and medication or insulin into daily log - take the blood sugar log to all doctor visits    Why is this important?   Checking your blood sugar at home helps to keep it from getting very high or very low.  Writing the results in a diary or log helps the doctor know how to care for you.  Your blood sugar log should have the time, date and the results.  Also, write down the amount of insulin or other medicine that you take.  Other information, like what you ate, exercise done and how you were feeling, will also be helpful.     Notes:         Patient verbalizes understanding of instructions provided today and agrees to view in [place].   Telephone follow up appointment with care management team member  scheduled for: 05/22/21 at 11:30 AM Peter Garter RN, Jackquline Denmark, CDE Care Management Coordinator [facility] [phone], Mobile [phone]

## 2021-04-10 NOTE — Chronic Care Management (AMB) (Signed)
Chronic Care Management   CCM RN Visit Note  04/10/2021 Name: [name] MRN: [id number] DOB: 06-Nov-1929  Subjective: [name] is a 87 y.o. year old female who is a primary care patient of Panosh, Standley Brooking, MD. The care management team was consulted for assistance with disease management and care coordination needs.    Engaged with patient by telephone for follow up visit in response to provider referral for case management and/or care coordination services.   Consent to Services:  The patient was given information about Chronic Care Management services, agreed to services, and gave verbal consent prior to initiation of services.  Please see initial visit note for detailed documentation.   Patient agreed to services and verbal consent obtained.   Assessment: Review of patient past medical history, allergies, medications, health status, including review of consultants reports, laboratory and other test data, was performed as part of comprehensive evaluation and provision of chronic care management services.   SDOH (Social Determinants of Health) assessments and interventions performed:    CCM Care Plan  Allergies  Allergen Reactions   Phenergan [Promethazine]     Slurred speech, acted very confused   Remeron [Mirtazapine] Itching    Outpatient Encounter Medications as of 04/10/2021  Medication Sig   gabapentin (NEURONTIN) 100 MG capsule take 1-2 capsules po at bedtime   acetaminophen (TYLENOL) 500 MG tablet Take 500 mg by mouth every 6 (six) hours as needed for mild pain or moderate pain.    ALPRAZolam (XANAX) 1 MG tablet Take 0.5-1 tablets (0.5-1 mg total) by mouth at bedtime as needed for anxiety. Avoid regular use.   aspirin EC 81 MG tablet Take 81 mg by mouth daily at 12 noon.    atorvastatin (LIPITOR) 10 MG tablet Take 1 tablet (10 mg total) by mouth daily.   Cholecalciferol (VITAMIN D) 50 MCG (2000 UT) tablet Take 2,000 Units by mouth daily.   cyanocobalamin 1000  MCG tablet Take 1,000 mcg by mouth at bedtime.    docusate sodium (COLACE) 100 MG capsule Take 1 capsule (100 mg total) by mouth 2 (two) times daily.   esomeprazole (NEXIUM) 40 MG capsule Take 1 capsule (40 mg total) by mouth daily.   furosemide (LASIX) 40 MG tablet TAKE 1 TABLET DAILY OR AS DIRECTED   glipiZIDE (GLUCOTROL) 10 MG tablet Take 1 tablet (10 mg total) by mouth in the morning.   MOVANTIK 25 MG TABS tablet TAKE 1 TABLET(25 MG) BY MOUTH DAILY   senna-docusate (SENOKOT-S) 8.6-50 MG tablet Take 2 tablets by mouth 2 (two) times daily.   traMADol (ULTRAM) 50 MG tablet Take 2 tablets (100 mg total) by mouth every 6 (six) hours as needed for moderate pain.   Trospium Chloride 60 MG CP24 Take 1 capsule by mouth daily.   Turmeric 500 MG CAPS Take by mouth daily.   No facility-administered encounter medications on file as of 04/10/2021.    Patient Active Problem List   Diagnosis Date Noted   Peripheral edema 07/11/2020   Sleep disturbance 06/12/2020   Drug induced constipation    Neurogenic bladder    Constipation    Hypoalbuminemia due to protein-calorie malnutrition (HCC)    Benign essential HTN    Stage 3b chronic kidney disease (HCC)    Chronic pain syndrome    Controlled type 2 diabetes mellitus with hyperglycemia, without long-term current use of insulin (HCC)    Lumbar radiculopathy 05/02/2020   Spondylolisthesis of lumbar region 04/24/2020   Swelling of left  lower extremity 02/18/2020   Urinary retention with incomplete bladder emptying    Hip pain    Severe back pain    AKI (acute kidney injury) ([place])    Intractable pain/ left hip osteoarthritis 12/25/2019   Spinal stenosis, lumbar region, with neurogenic claudication 03/06/2016   Malignant neoplasm of right female breast ([place]) 01/11/2016   Controlled type 2 diabetes mellitus without complication, without long-term current use of insulin ([place]) 01/11/2016   Genetic testing 08/18/2015   Family history of breast cancer     Family history of prostate cancer    Family history of pancreatic cancer    Breast cancer of upper-outer quadrant of right female breast ([place]) 07/21/2015   Insomnia 05/06/2015   Medication management 12/16/2013   Hand arthritis 12/16/2013   Rectal bleeding 12/16/2013   Cold intolerance 06/17/2013   Renal insufficiency 12/23/2012   Post-menopause 12/23/2012   Hyperlipidemia 12/23/2012   Postmenopausal HRT (hormone replacement therapy) 12/23/2012   History of shingles 12/23/2012   Wears hearing aid 12/23/2012   Nonspecific abnormal unspecified cardiovascular function study 02/28/2012   Chronic sore throat 01/13/2012   Hoarse 01/13/2012   Chest pressure 12/03/2011   Itching  mid back 12/03/2011   Abnormal EKG 12/03/2011   Medicare annual wellness visit, subsequent 12/03/2011   DJD (degenerative joint disease) 12/03/2011   DIABETES MELLITUS, TYPE II, CONTROLLED 11/15/2009   VITAMIN D DEFICIENCY 11/15/2009   EXOGENOUS OBESITY 11/15/2009   ANXIETY DEPRESSION 12/22/2008   HYPERTHYROIDISM 08/23/2008   TOTAL KNEE REPLACEMENT, LEFT, HX OF 08/23/2008   Hypothyroidism 10/08/2007   ANEMIA 10/08/2007   Osteoarthritis 10/08/2007   OSTEOPENIA 10/08/2007   Dyslipidemia 11/07/2006   GERD (gastroesophageal reflux disease) 11/07/2006    Conditions to be addressed/monitored:HLD, DMII, and chronic pain, osteoarthritis  Care Plan : RNCM:Diabetes Type 2 (Adult)  Updates made by Dimitri Ped, RN since 04/10/2021 12:00 AM     Problem: Glycemic Management (Diabetes, Type 2)   Priority: Medium     Long-Range Goal: Glycemic Management Optimized   Start Date: 09/28/2020  Expected End Date: 09/05/2021  This Visit's Progress: On track  Recent Progress: On track  Priority: Medium  Note:   Objective:  Lab Results  Component Value Date   HGBA1C 6.9 (H) 08/14/2020   Lab Results  Component Value Date   CREATININE 1.47 (H) 08/14/2020   CREATININE 1.25 (H) 06/14/2020   CREATININE 1.15 (H)  05/31/2020   Lab Results  Component Value Date   EGFR 32 (L) 07/26/2015   Current Barriers:  Knowledge Deficits related to basic Diabetes pathophysiology and self care/management Knowledge Deficits related to medications used for management of diabetes Unable to independently daughter assists with most IADLS, impaired mobility  Does not adhere to provider recommendations re: exercise, diet Unable to perform IADLs independently States that she had anther episode of feeling like her sugar was low a few days ago.  States she did not check her CBG but eat some hard candy and she felt better.  Denies any change in what she eats or her activity that day.  States she has not been checking her CBG very often States she is eating healthy and trying to follow a low CHO, low sodium diet  Case Manager Clinical Goal(s):  patient will demonstrate improved adherence to prescribed treatment plan for diabetes self care/management as evidenced by: daily monitoring and recording of CBG  adherence to ADA/ carb modified diet adherence to prescribed medication regimen contacting provider for new or worsened symptoms or  questions Interventions:  Collaboration with Panosh, Standley Brooking, MD regarding development and update of comprehensive plan of care as evidenced by provider attestation and co-signature Inter-disciplinary care team collaboration (see longitudinal plan of care) Reinforced education to patient about basic DM disease process Reviewed medications with patient and discussed importance of medication adherence Discussed plans with patient for ongoing care management follow up and provided patient with direct contact information for care management team Reinforced instructions on hypoglycemia  and hyperglycemia and importance of correct treatment Reviewed scheduled/upcoming provider appointments including: Dr. Regis Bill 06/20/21  Reinforced to check cbg daily and record, calling primary care provider for findings  outside established parameters.   Review of patient status, including review of consultants reports, relevant laboratory and other test results, and medications completed. Reinforced instructions to wear compression hose, to put on in the morning and remove at night, and to keep her legs elevated as much as possible Reinforced the rule of 15 to treat hypoglycemia. Reviewed to check her CBG when she feels low. Reviewed to notify her doctor if she has frequent episodes of hypoglycemia. Reinforced how her glipizide can cause hypoglycemia is she does not eat enough or if she is more active than usual  Self-Care Activities - Self administers oral medications as prescribed Attends all scheduled provider appointments Checks blood sugars as prescribed and utilize hyper and hypoglycemia protocol as needed Adheres to prescribed ADA/carb modified Patient Goals: - check blood sugar at prescribed times - check blood sugar if I feel it is too high or too low - enter blood sugar readings and medication or insulin into daily log - take the blood sugar log to all doctor visits - drink 6 to 8 glasses of water each day - fill half of plate with vegetables - limit fast food meals to no more than 1 per week - read food labels for fat, fiber, carbohydrates and portion size - keep appointment with eye doctor - check feet daily for cuts, sores or redness - keep feet up while sitting - wear comfortable, well-fitting shoes - check blood sugar at prescribed times - check blood sugar if I feel it is too high or too low - enter blood sugar readings and medication or insulin into daily log - take the blood sugar log to all doctor visits Follow Up Plan: Telephone follow up appointment with care management team member scheduled for: 05/22/21 at 11:30 AM The patient has been provided with contact information for the care management team and has been advised to call with any health related questions or concerns.     Care Plan : RNCM:Chronic Pain (Adult)  Updates made by Dimitri Ped, RN since 04/10/2021 12:00 AM     Problem: Chronic Pain Management (Chronic Pain)   Priority: High     Long-Range Goal: Chronic Pain Managed   Start Date: 09/28/2020  Expected End Date: 09/05/2021  This Visit's Progress: On track  Recent Progress: On track  Priority: High  Note:   Current Barriers:  Knowledge Deficits related to self-health management of chronic pain- lt hip osteoarthritis, degenerative disc disease.  Pt had back surgery 05/02/20, reports still weak and having issues with lt hip and leg pain,  uses walker at all times, reports taking Tramadol at bedtime and Tylenol during the day for pain relief, Daughter assists pt with transportation and her care as needed.  Husband assists if needed and does some cooking-  States she is to see Dr. Rush Farmer on 10/17 to  discuss if  she needs to have hip surgery. States her shoulders have also been aching from using her walker. States she got an injection in her lt hip that helped with the pain for about 2 weeks.    States she does not want to get surgery if she does not need it.  Denies any falls.  States she is taking Miralax as needed for constipation. States she is still  getting home health PT once a week and she tries to do the exercises every day Chronic Disease Management support and education needs related to chronic pain Knowledge Deficits related to self management of chronic pain Chronic Disease Management support and education needs related to self management of chronic pain Unable to independently self manage chronic pain Unable to perform IADLs independently Clinical Goal(s):  patient will verbalize understanding of plan for pain management. , patient will attend all scheduled medical appointments: Dr.Panosh 06/20/21, patient will demonstrate use of different relaxation  skills and/or diversional activities to assist with pain reduction (distraction,  imagery, relaxation, massage, acupressure, TENS, heat, and cold application., patient will report pain at a level less than 3 to 4 on a 10-10 rating scale., patient will use pharmacological and nonpharmacological pain relief strategies as prescribed. , and patient will verbalize acceptable level of pain relief and ability to engage in desired activities Interventions:  Collaboration with Panosh, Standley Brooking, MD regarding development and update of comprehensive plan of care as evidenced by provider attestation and co-signature- Pain assessment performed Medications reviewed Discussed plans with patient for ongoing care management follow up and provided patient with direct contact information for care management team Evaluation of current treatment plan related to self management of chronic pain and patient's adherence to plan as established by provider. Reinforced education to patient re: self management of chronic pain Reviewed medications with patient and discussed use of pain medications-reinforced to take pain medication during the day if she is having more pain Reviewed scheduled/upcoming provider appointments including: Dr. Regis Bill 06/20/21, Dr. Rush Farmer 04/23/21 Discussed plans with patient for ongoing care management follow up and provided patient with direct contact information for care management team Reinforced to continue to do therapy exercises and to use heat and cold applications to help with her chronic pain Reinforced importance of avoiding constipation by use of medications, eating more fiber and drinking adequate amounts of fluids Patient Goals/Self Care Activities:  Will self-administer medications as prescribed Will attend all scheduled provider appointments Will call pharmacy for medication refills 7 days prior to needed refill date Patient will calls provider office for new concerns or questions - learn relaxation techniques - practice acceptance of chronic pain - practice  relaxation or meditation daily - think of new ways to do favorite things - use distraction techniques - use relaxation during pain Follow Up Plan: Telephone follow up appointment with care management team member scheduled for: 05/22/21 at 11:30  AM The patient has been provided with contact information for the care management team and has been advised to call with any health related questions or concerns.        Plan:Telephone follow up appointment with care management team member scheduled for:  05/22/21 and The patient has been provided with contact information for the care management team and has been advised to call with any health related questions or concerns.  Peter Garter RN, Jackquline Denmark, CDE Care Management Coordinator [facility] [phone], Mobile [phone]

## 2021-04-11 ENCOUNTER — Ambulatory Visit: Payer: Medicare Other | Admitting: Orthopaedic Surgery

## 2021-04-13 DIAGNOSIS — Z23 Encounter for immunization: Secondary | ICD-10-CM | POA: Diagnosis not present

## 2021-04-16 ENCOUNTER — Telehealth: Payer: Self-pay | Admitting: Internal Medicine

## 2021-04-16 ENCOUNTER — Telehealth (INDEPENDENT_AMBULATORY_CARE_PROVIDER_SITE_OTHER): Payer: Medicare Other | Admitting: Family Medicine

## 2021-04-16 DIAGNOSIS — U071 COVID-19: Secondary | ICD-10-CM

## 2021-04-16 MED ORDER — MOLNUPIRAVIR EUA 200MG CAPSULE: 4.0000 | ORAL_CAPSULE | Freq: Two times a day (BID) | ORAL | 0 refills | Status: AC

## 2021-04-16 NOTE — Telephone Encounter (Signed)
Pt has virtual visit schedule for 04/16/2021 with Dr. Elease Hashimoto to discuss status.

## 2021-04-16 NOTE — Telephone Encounter (Signed)
I spoke with the pt and and virtual visit has been scheduled for 10/10

## 2021-04-16 NOTE — Telephone Encounter (Signed)
Patient's daughter called after hours nurse line on 04/14/21. She stated that patient tested positive for Covid--she has a runny nose and low grade fever.

## 2021-04-16 NOTE — Progress Notes (Signed)
Patient ID: [name], female   DOB: 12/22/1929, 87 y.o.   MRN: [id number]  This visit type was conducted due to national recommendations for restrictions regarding the COVID-19 pandemic in an effort to limit this patient's exposure and mitigate transmission in our community.   Virtual Visit via Telephone Note  I connected with [name] on 04/16/21 at 10:00 AM EDT by telephone and verified that I am speaking with the correct person using two identifiers.   I discussed the limitations, risks, security and privacy concerns of performing an evaluation and management service by telephone and the availability of in person appointments. I also discussed with the patient that there may be a patient responsible charge related to this service. The patient expressed understanding and agreed to proceed.  Location patient: home Location provider: work or home office Participants present for the call: patient, provider Patient did not have a visit in the prior 7 days to address this/these issue(s).   History of Present Illness:  [name] has COVID-19.  She states she and her husband tested positive on Saturday.  Her husband is actually currently admitted with pneumonia.  She relates onset last Thursday or Friday of sore throat, low-grade fever, cough, malaise.  She had some mild diarrhea.  No vomiting.  She does have type 2 diabetes and also has chronic kidney disease.  She denies any dyspnea though she states her O2 sats have been around 89 to 90%.  She does not feel particularly ill today but still has some congestion and cough.  She has been vaccinated for COVID.  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 , HER2/neu negative-- s/p right lumpectomy (without SLNB) and  radiation therapy   Chronic lower back pain    CKD (chronic kidney disease) stage 3, GFR 30-59 ml/min (HCC)    DDD (degenerative disc disease)    lumbar   Diverticulosis    Family history of breast cancer    Family history of pancreatic cancer    Family history of prostate cancer    Flaccid neuropathic bladder, not elsewhere classified    Foley catheter in place    GERD (gastroesophageal reflux disease)    Hemorrhoids    Hiatal hernia    History of colon polyps    History of radiation therapy 09-12-2015 to 10-12-2015   42.72 gray in 16 fractions directed right breast w/ boost of 12 gray in 6 fractions directed at the lumpectomy cavity- Total dose: 54.72y   History of recurrent UTIs    Hyperlipidemia    PONV (postoperative nausea and vomiting)    RBBB (right bundle branch block with left anterior fascicular block)    Type 2 diabetes mellitus ([place])    Urinary retention with incomplete bladder emptying    Wears dentures    full upper and lower partial   Wears glasses    Wears hearing aid    bilateral but does not wear   Past Surgical History:  Procedure Laterality Date   BACK SURGERY     BREAST LUMPECTOMY WITH RADIOACTIVE SEED LOCALIZATION Right 08/03/2015   Procedure: BREAST LUMPECTOMY WITH RADIOACTIVE SEED LOCALIZATION;  Surgeon: [name], MD;  Location: [facility];  Service: General;  Laterality:  Right;   BREAST SURGERY     CARDIOVASCULAR STRESS TEST  01/14/2012   Low risk nuclear study w/ small mild apical and apical lateral reversible perfusion defect represents a small area of ischemia versus shifting breast artifact/  normal LV funciton and wall motion, ef 86%   CARPAL TUNNEL RELEASE Right 1977   CATARACT EXTRACTION W/ INTRAOCULAR LENS  IMPLANT, BILATERAL Bilateral left 1996/  right 1997   CLOSED RIGHT KNEE MANIPULATION  08/11/2002   post TKA   CYSTOSTOMY N/A 05/17/2016   Procedure: CYSTOSCOPY WITH  SUPRAPUBIC PLACMENT;  Surgeon: [name], MD;   Location: [facility];  Service: Urology;  Laterality: N/A;   EYE SURGERY     GANGLION CYST EXCISION Right 12/09/2001   right palm   HERNIA REPAIR     JOINT REPLACEMENT     KNEE ARTHROSCOPY Right 12/14/2002   w/ Lysis Adhesions   LAMINECTOMY  04/24/2020   Bilateral redo L2-3, L3-4 and L4-5 laminotomy/laminectomy/foraminotomies/medial facetectomy to decompress the bilateral L3, L4 and L5 nerve roots   LUMBAR LAMINECTOMY/DECOMPRESSION MICRODISCECTOMY N/A 03/06/2016   Procedure: CENTRAL DECOMPRESSION L3 - L4 ,L4 - L5;  Surgeon: [name], MD;  Location: [facility];  Service: Orthopedics;  Laterality: N/A;   SHOULDER HEMI-ARTHROPLASTY Right 02/20/2009   avascular necrosis    TOTAL KNEE ARTHROPLASTY Bilateral right 06-23-2002/  left [id number]   UMBILICAL HERNIA REPAIR  1958   VAGINAL HYSTERECTOMY  1975    reports that she has never smoked. She has never used smokeless tobacco. She reports that she does not drink alcohol and does not use drugs. family history includes Coronary artery disease in her brother; Diabetes in her brother, daughter, father, mother, sister, and son; Heart attack in her brother; Heart disease in her father; Pancreatic cancer (age of onset: 86) in her mother; Prostate cancer in her brother. Allergies  Allergen Reactions   Phenergan [Promethazine]     Slurred speech, acted very confused   Remeron [Mirtazapine] Itching      Observations/Objective: Patient sounds cheerful and well on the phone. I do not appreciate any SOB. Speech and thought processing are grossly intact. Patient reported vitals:  Assessment and Plan:  COVID-19 infection.  Patient has high risk because of her age and comorbidities.  She relates low O2 sats by home monitor but denies any respiratory distress and states that she does not feel particularly dyspneic with basic activities of moving around in the home.  -We have recommended starting Molnupiravir 4 capsules by mouth twice  daily for 5 days given her age and comorbidities. -Follow-up immediately for increased shortness of breath or other concerns  Follow Up Instructions: As above   99441 5-10 99442 11-20 99443 21-30 I did not refer this patient for an OV in the next 24 hours for this/these issue(s).  I discussed the assessment and treatment plan with the patient. The patient was provided an opportunity to ask questions and all were answered. The patient agreed with the plan and demonstrated an understanding of the instructions.   The patient was advised to call back or seek an in-person evaluation if the symptoms worsen or if the condition fails to improve as anticipated.  I provided 22 minutes of non-face-to-face time during this encounter.   Carolann Littler, MD

## 2021-04-23 ENCOUNTER — Ambulatory Visit (INDEPENDENT_AMBULATORY_CARE_PROVIDER_SITE_OTHER): Payer: Medicare Other | Admitting: Orthopaedic Surgery

## 2021-04-23 ENCOUNTER — Other Ambulatory Visit: Payer: Self-pay

## 2021-04-23 VITALS — Ht 60.0 in | Wt 140.0 lb

## 2021-04-23 DIAGNOSIS — M1612 Unilateral primary osteoarthritis, left hip: Secondary | ICD-10-CM | POA: Insufficient documentation

## 2021-04-23 DIAGNOSIS — M25512 Pain in left shoulder: Secondary | ICD-10-CM

## 2021-04-23 DIAGNOSIS — M25552 Pain in left hip: Secondary | ICD-10-CM | POA: Diagnosis not present

## 2021-04-23 MED ORDER — METHYLPREDNISOLONE ACETATE 40 MG/ML IJ SUSP
40.0000 mg | INTRAMUSCULAR | Status: AC | PRN
  Administered 2021-04-23: 40 mg via INTRA_ARTICULAR

## 2021-04-23 MED ORDER — LIDOCAINE HCL 1 % IJ SOLN
3.0000 mL | INTRAMUSCULAR | Status: AC | PRN
  Administered 2021-04-23: 3 mL

## 2021-04-23 NOTE — Progress Notes (Signed)
Office Visit Note   Patient: [name]           Date of Birth: 1930-06-03           MRN: [id number] Visit Date: 04/23/2021              Requested by: Burnis Medin, MD [place],  [place] [address] PCP: Burnis Medin, MD   Assessment & Plan: Visit Diagnoses:  1. Pain in left hip   2. Unilateral primary osteoarthritis, left hip   3. Acute pain of left shoulder     Plan:   I spoke to the patient in length in detail about hip replacement surgery.  I described what the surgery involves in detail and showed them her x-rays as well as a hip replacement model.  We talked about the interoperative and postoperative course and what to expect with this type of surgery.  Given the debilitating nature of her pain combined with her x-ray findings she does wish to have this surgery performed in the near future.  I discussed the risks and benefits of surgery in detail and she does wish to proceed so we will work on getting this scheduled.  I did provide a steroid injection per her request in her left shoulder subacromial outlet.  All questions and concerns were answered and addressed.  We will be in touch with scheduling surgery.  Follow-Up Instructions: Return for 2 weeks post-op.   Orders:  No orders of the defined types were placed in this encounter.  No orders of the defined types were placed in this encounter.     Procedures: Large Joint Inj: L subacromial bursa on 04/23/2021 2:06 PM Indications: pain and diagnostic evaluation Details: 22 G 1.5 in needle  Arthrogram: No  Medications: 3 mL lidocaine 1 %; 40 mg methylPREDNISolone acetate 40 MG/ML Outcome: tolerated well, no immediate complications Procedure, treatment alternatives, risks and benefits explained, specific risks discussed. Consent was given by the patient. Immediately prior to procedure a time out was called to verify the correct patient, procedure, equipment, support staff and site/side marked as  required. Patient was prepped and draped in the usual sterile fashion.      Clinical Data: No additional findings.   Subjective: Chief Complaint  Patient presents with   Left Hip - Pain  The patient is sometimes seen for the first time as a patient.  She is 87 years old and actually referred by a friend.  She has seen Dr. Ernestina Patches before and he placed a steroid injection in her left hip joint which has not helped.  She has known and well-documented severe end-stage arthritis in her left hip.  She does ambulate with a walker but is in a wheelchair today.  She has a chronic indwelling Foley catheter secondary to several back surgeries of the lumbar spine.  Her left hip pain has become debilitating and is definitely affecting her mobility, her quality of life and actives daily living.  She is now also experiencing left shoulder pain taking care of her husband who was hospitalized with COVID.  She is a diabetic but has a hemoglobin A1c of below 7.  She is not on any blood thinning medications other than a daily baby aspirin.  She has no significant heart issues.  She has been dealing with this worsening debilitating left hip pain for a while now but is gotten significantly worse to where she is interested in hip replacement surgery.  HPI  Review of Systems   Objective: Vital Signs: Ht 5' (1.524 m)   Wt 140 lb (63.5 kg)   BMI 27.34 kg/m   Physical Exam She is alert and orient x3 and in no acute distress Ortho Exam Examination of her right hip is normal examination of her left hip shows significant limitations with internal ex rotation as well as severe pain with attempts of rotation of the left hip.  There is definitely rotator cuff deficiency of the left shoulder with limited range of motion and obvious rotator cuff arthropathy on clinical exam. Specialty Comments:  No specialty comments available.  Imaging: No results found. Previous x-rays from earlier this year on the canopy system of  the pelvis and both hips shows severe end-stage arthritis of the left hip with actually protrusio of both hips but the right hip does not have the severe disease the left hip does.  The left hip has significant sclerotic changes and joint space narrowing.  PMFS History: Patient Active Problem List   Diagnosis Date Noted   Unilateral primary osteoarthritis, left hip 04/23/2021   Peripheral edema 07/11/2020   Sleep disturbance 06/12/2020   Drug induced constipation    Neurogenic bladder    Constipation    Hypoalbuminemia due to protein-calorie malnutrition (HCC)    Benign essential HTN    Stage 3b chronic kidney disease (HCC)    Chronic pain syndrome    Controlled type 2 diabetes mellitus with hyperglycemia, without long-term current use of insulin (HCC)    Lumbar radiculopathy 05/02/2020   Spondylolisthesis of lumbar region 04/24/2020   Swelling of left lower extremity 02/18/2020   Urinary retention with incomplete bladder emptying    Hip pain    Severe back pain    AKI (acute kidney injury) ([facility])    Intractable pain/ left hip osteoarthritis 12/25/2019   Spinal stenosis, lumbar region, with neurogenic claudication 03/06/2016   Malignant neoplasm of right female breast ([facility]) 01/11/2016   Controlled type 2 diabetes mellitus without complication, without long-term current use of insulin ([facility]) 01/11/2016   Genetic testing 08/18/2015   Family history of breast cancer    Family history of prostate cancer    Family history of pancreatic cancer    Breast cancer of upper-outer quadrant of right female breast ([facility]) 07/21/2015   Insomnia 05/06/2015   Medication management 12/16/2013   Hand arthritis 12/16/2013   Rectal bleeding 12/16/2013   Cold intolerance 06/17/2013   Renal insufficiency 12/23/2012   Post-menopause 12/23/2012   Hyperlipidemia 12/23/2012   Postmenopausal HRT (hormone replacement therapy) 12/23/2012   History of shingles 12/23/2012   Wears hearing aid 12/23/2012    Nonspecific abnormal unspecified cardiovascular function study 02/28/2012   Chronic sore throat 01/13/2012   Hoarse 01/13/2012   Chest pressure 12/03/2011   Itching  mid back 12/03/2011   Abnormal EKG 12/03/2011   Medicare annual wellness visit, subsequent 12/03/2011   DJD (degenerative joint disease) 12/03/2011   DIABETES MELLITUS, TYPE II, CONTROLLED 11/15/2009   VITAMIN D DEFICIENCY 11/15/2009   EXOGENOUS OBESITY 11/15/2009   ANXIETY DEPRESSION 12/22/2008   HYPERTHYROIDISM 08/23/2008   TOTAL KNEE REPLACEMENT, LEFT, HX OF 08/23/2008   Hypothyroidism 10/08/2007   ANEMIA 10/08/2007   Osteoarthritis 10/08/2007   OSTEOPENIA 10/08/2007   Dyslipidemia 11/07/2006   GERD (gastroesophageal reflux disease) 11/07/2006   Past Medical History:  Diagnosis Date   Anemia    Arthritis    Bilateral edema of lower extremity    Breast cancer of upper-outer quadrant of right female  breast [facility]) dx 07/19/2015--- oncologist-  dr Lindi Adie dr [facility]   DCIS,  grade 3, 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 , HER2/neu negative-- s/p right lumpectomy (without SLNB) and radiation therapy   Chronic lower back pain    CKD (chronic kidney disease) stage 3, GFR 30-59 ml/min (HCC)    DDD (degenerative disc disease)    lumbar   Diverticulosis    Family history of breast cancer    Family history of pancreatic cancer    Family history of prostate cancer    Flaccid neuropathic bladder, not elsewhere classified    Foley catheter in place    GERD (gastroesophageal reflux disease)    Hemorrhoids    Hiatal hernia    History of colon polyps    History of radiation therapy 09-12-2015 to 10-12-2015   42.72 gray in 16 fractions directed right breast w/ boost of 12 gray in 6 fractions directed at the lumpectomy cavity- Total dose: 54.72y   History of recurrent UTIs    Hyperlipidemia    PONV (postoperative nausea and vomiting)    RBBB (right bundle branch block with left anterior fascicular block)    Type 2 diabetes  mellitus ([facility])    Urinary retention with incomplete bladder emptying    Wears dentures    full upper and lower partial   Wears glasses    Wears hearing aid    bilateral but does not wear    Family History  Problem Relation Age of Onset   Pancreatic cancer Mother 40   Diabetes Mother    Prostate cancer Brother    Diabetes Father    Heart disease Father    Diabetes Brother    Heart attack Brother    Diabetes Sister    Diabetes Daughter    Diabetes Son    Coronary artery disease Brother        MI in his 65s   Colon cancer Neg Hx    Stomach cancer Neg Hx     Past Surgical History:  Procedure Laterality Date   BACK SURGERY     BREAST LUMPECTOMY WITH RADIOACTIVE SEED LOCALIZATION Right 08/03/2015   Procedure: BREAST LUMPECTOMY WITH RADIOACTIVE SEED LOCALIZATION;  Surgeon: [name], MD;  Location: [facility];  Service: General;  Laterality: Right;   BREAST SURGERY     CARDIOVASCULAR STRESS TEST  01/14/2012   Low risk nuclear study w/ small mild apical and apical lateral reversible perfusion defect represents a small area of ischemia versus shifting breast artifact/  normal LV funciton and wall motion, ef 86%   CARPAL TUNNEL RELEASE Right 1977   CATARACT EXTRACTION W/ INTRAOCULAR LENS  IMPLANT, BILATERAL Bilateral left 1996/  right 1997   CLOSED RIGHT KNEE MANIPULATION  08/11/2002   post TKA   CYSTOSTOMY N/A 05/17/2016   Procedure: CYSTOSCOPY WITH  SUPRAPUBIC PLACMENT;  Surgeon: [name], MD;  Location: [facility];  Service: Urology;  Laterality: N/A;   EYE SURGERY     GANGLION CYST EXCISION Right 12/09/2001   right palm   HERNIA REPAIR     JOINT REPLACEMENT     KNEE ARTHROSCOPY Right 12/14/2002   w/ Lysis Adhesions   LAMINECTOMY  04/24/2020   Bilateral redo L2-3, L3-4 and L4-5 laminotomy/laminectomy/foraminotomies/medial facetectomy to decompress the bilateral L3, L4 and L5 nerve roots   LUMBAR LAMINECTOMY/DECOMPRESSION  MICRODISCECTOMY N/A 03/06/2016   Procedure: CENTRAL DECOMPRESSION L3 - L4 ,L4 - L5;  Surgeon: [name], MD;  Location: [facility];  Service: Orthopedics;  Laterality: N/A;   SHOULDER HEMI-ARTHROPLASTY Right 02/20/2009   avascular necrosis    TOTAL KNEE ARTHROPLASTY Bilateral right 06-23-2002/  left [id number]   UMBILICAL HERNIA REPAIR  1958   VAGINAL HYSTERECTOMY  1975   Social History   Occupational History   Occupation: Retired  Tobacco Use   Smoking status: Never   Smokeless tobacco: Never  Vaping Use   Vaping Use: Never used  Substance and Sexual Activity   Alcohol use: No   Drug use: No   Sexual activity: Never

## 2021-05-04 ENCOUNTER — Other Ambulatory Visit: Payer: Self-pay | Admitting: Internal Medicine

## 2021-05-04 DIAGNOSIS — E119 Type 2 diabetes mellitus without complications: Secondary | ICD-10-CM

## 2021-05-07 DIAGNOSIS — R338 Other retention of urine: Secondary | ICD-10-CM | POA: Diagnosis not present

## 2021-05-07 DIAGNOSIS — M1612 Unilateral primary osteoarthritis, left hip: Secondary | ICD-10-CM | POA: Diagnosis not present

## 2021-05-07 DIAGNOSIS — E7800 Pure hypercholesterolemia, unspecified: Secondary | ICD-10-CM

## 2021-05-07 DIAGNOSIS — E1169 Type 2 diabetes mellitus with other specified complication: Secondary | ICD-10-CM

## 2021-05-09 ENCOUNTER — Encounter: Payer: Self-pay | Admitting: Orthopaedic Surgery

## 2021-05-21 MED ORDER — AMOXICILLIN-POT CLAVULANATE 875-125 MG PO TABS: 1.0000 | ORAL_TABLET | Freq: Two times a day (BID) | ORAL | 0 refills | Status: DC

## 2021-05-21 NOTE — Telephone Encounter (Signed)
This could also be gout. I will send in prescription for antibiotic Need follow-up telephone or virtual visit with pictures on Wednesday to see how it is doing.

## 2021-05-22 ENCOUNTER — Telehealth: Payer: Self-pay

## 2021-05-22 ENCOUNTER — Ambulatory Visit (INDEPENDENT_AMBULATORY_CARE_PROVIDER_SITE_OTHER): Payer: Medicare Other

## 2021-05-22 DIAGNOSIS — M1990 Unspecified osteoarthritis, unspecified site: Secondary | ICD-10-CM

## 2021-05-22 DIAGNOSIS — E1169 Type 2 diabetes mellitus with other specified complication: Secondary | ICD-10-CM

## 2021-05-22 DIAGNOSIS — E7800 Pure hypercholesterolemia, unspecified: Secondary | ICD-10-CM

## 2021-05-22 DIAGNOSIS — G8929 Other chronic pain: Secondary | ICD-10-CM

## 2021-05-22 NOTE — Telephone Encounter (Signed)
Noted  

## 2021-05-22 NOTE — Telephone Encounter (Signed)
I spoke with the pt and Virtual visit scheduled for 05/23/2021

## 2021-05-22 NOTE — Chronic Care Management (AMB) (Signed)
Chronic Care Management   CCM RN Visit Note  05/22/2021 Name: KIANA HOLLAR MRN: [id number] DOB: 1929-09-27  Subjective: [name] is a 87 y.o. year old female who is a primary care patient of Panosh, Standley Brooking, MD. The care management team was consulted for assistance with disease management and care coordination needs.    Engaged with patient by telephone for follow up visit in response to provider referral for case management and/or care coordination services.   Consent to Services:  The patient was given information about Chronic Care Management services, agreed to services, and gave verbal consent prior to initiation of services.  Please see initial visit note for detailed documentation.   Patient agreed to services and verbal consent obtained.   Assessment: Review of patient past medical history, allergies, medications, health status, including review of consultants reports, laboratory and other test data, was performed as part of comprehensive evaluation and provision of chronic care management services.   SDOH (Social Determinants of Health) assessments and interventions performed:    CCM Care Plan  Allergies  Allergen Reactions   Phenergan [Promethazine]     Slurred speech, acted very confused   Remeron [Mirtazapine] Itching    Outpatient Encounter Medications as of 05/22/2021  Medication Sig   acetaminophen (TYLENOL) 500 MG tablet Take 500 mg by mouth every 6 (six) hours as needed for mild pain or moderate pain.    ALPRAZolam (XANAX) 1 MG tablet Take 0.5-1 tablets (0.5-1 mg total) by mouth at bedtime as needed for anxiety. Avoid regular use.   amoxicillin-clavulanate (AUGMENTIN) 875-125 MG tablet Take 1 tablet by mouth every 12 (twelve) hours.   aspirin EC 81 MG tablet Take 81 mg by mouth daily at 12 noon.    atorvastatin (LIPITOR) 10 MG tablet TAKE 1 TABLET DAILY   Cholecalciferol (VITAMIN D) 50 MCG (2000 UT) tablet Take 2,000 Units by mouth daily.    cyanocobalamin 1000 MCG tablet Take 1,000 mcg by mouth at bedtime.    docusate sodium (COLACE) 100 MG capsule Take 1 capsule (100 mg total) by mouth 2 (two) times daily.   esomeprazole (NEXIUM) 40 MG capsule TAKE 1 CAPSULE DAILY   furosemide (LASIX) 40 MG tablet TAKE 1 TABLET DAILY OR AS DIRECTED   gabapentin (NEURONTIN) 100 MG capsule take 1-2 capsules po at bedtime   glipiZIDE (GLUCOTROL) 10 MG tablet TAKE 1 TABLET IN THE MORNING   MOVANTIK 25 MG TABS tablet TAKE 1 TABLET(25 MG) BY MOUTH DAILY   senna-docusate (SENOKOT-S) 8.6-50 MG tablet Take 2 tablets by mouth 2 (two) times daily.   traMADol (ULTRAM) 50 MG tablet Take 2 tablets (100 mg total) by mouth every 6 (six) hours as needed for moderate pain.   Trospium Chloride 60 MG CP24 Take 1 capsule by mouth daily.   Turmeric 500 MG CAPS Take by mouth daily.   No facility-administered encounter medications on file as of 05/22/2021.    Patient Active Problem List   Diagnosis Date Noted   Unilateral primary osteoarthritis, left hip 04/23/2021   Peripheral edema 07/11/2020   Sleep disturbance 06/12/2020   Drug induced constipation    Neurogenic bladder    Constipation    Hypoalbuminemia due to protein-calorie malnutrition (HCC)    Benign essential HTN    Stage 3b chronic kidney disease (HCC)    Chronic pain syndrome    Controlled type 2 diabetes mellitus with hyperglycemia, without long-term current use of insulin (HCC)    Lumbar radiculopathy 05/02/2020   Spondylolisthesis  of lumbar region 04/24/2020   Swelling of left lower extremity 02/18/2020   Urinary retention with incomplete bladder emptying    Hip pain    Severe back pain    AKI (acute kidney injury) ([place])    Intractable pain/ left hip osteoarthritis 12/25/2019   Spinal stenosis, lumbar region, with neurogenic claudication 03/06/2016   Malignant neoplasm of right female breast ([place]) 01/11/2016   Controlled type 2 diabetes mellitus without complication, without long-term  current use of insulin ([place]) 01/11/2016   Genetic testing 08/18/2015   Family history of breast cancer    Family history of prostate cancer    Family history of pancreatic cancer    Breast cancer of upper-outer quadrant of right female breast ([place]) 07/21/2015   Insomnia 05/06/2015   Medication management 12/16/2013   Hand arthritis 12/16/2013   Rectal bleeding 12/16/2013   Cold intolerance 06/17/2013   Renal insufficiency 12/23/2012   Post-menopause 12/23/2012   Hyperlipidemia 12/23/2012   Postmenopausal HRT (hormone replacement therapy) 12/23/2012   History of shingles 12/23/2012   Wears hearing aid 12/23/2012   Nonspecific abnormal unspecified cardiovascular function study 02/28/2012   Chronic sore throat 01/13/2012   Hoarse 01/13/2012   Chest pressure 12/03/2011   Itching  mid back 12/03/2011   Abnormal EKG 12/03/2011   Medicare annual wellness visit, subsequent 12/03/2011   DJD (degenerative joint disease) 12/03/2011   DIABETES MELLITUS, TYPE II, CONTROLLED 11/15/2009   VITAMIN D DEFICIENCY 11/15/2009   EXOGENOUS OBESITY 11/15/2009   ANXIETY DEPRESSION 12/22/2008   HYPERTHYROIDISM 08/23/2008   TOTAL KNEE REPLACEMENT, LEFT, HX OF 08/23/2008   Hypothyroidism 10/08/2007   ANEMIA 10/08/2007   Osteoarthritis 10/08/2007   OSTEOPENIA 10/08/2007   Dyslipidemia 11/07/2006   GERD (gastroesophageal reflux disease) 11/07/2006    Conditions to be addressed/monitored:HLD, DMII, Osteoarthritis, and chronic pain  Care Plan : RNCM:Diabetes Type 2 (Adult)  Updates made by Dimitri Ped, RN since 05/22/2021 12:00 AM  Completed 05/22/2021   Problem: Glycemic Management (Diabetes, Type 2) Resolved 05/22/2021  Priority: Medium     Long-Range Goal: Glycemic Management Optimized Completed 05/22/2021  Start Date: 09/28/2020  Expected End Date: 09/05/2021  Recent Progress: On track  Priority: Medium  Note:   Resolving due to duplicate goal  Objective:  Lab Results  Component  Value Date   HGBA1C 6.9 (H) 08/14/2020   Lab Results  Component Value Date   CREATININE 1.47 (H) 08/14/2020   CREATININE 1.25 (H) 06/14/2020   CREATININE 1.15 (H) 05/31/2020   Lab Results  Component Value Date   EGFR 32 (L) 07/26/2015   Current Barriers:  Knowledge Deficits related to basic Diabetes pathophysiology and self care/management Knowledge Deficits related to medications used for management of diabetes Unable to independently daughter assists with most IADLS, impaired mobility  Does not adhere to provider recommendations re: exercise, diet Unable to perform IADLs independently States that she had anther episode of feeling like her sugar was low a few days ago.  States she did not check her CBG but eat some hard candy and she felt better.  Denies any change in what she eats or her activity that day.  States she has not been checking her CBG very often States she is eating healthy and trying to follow a low CHO, low sodium diet  Case Manager Clinical Goal(s):  patient will demonstrate improved adherence to prescribed treatment plan for diabetes self care/management as evidenced by: daily monitoring and recording of CBG  adherence to ADA/ carb modified diet adherence  to prescribed medication regimen contacting provider for new or worsened symptoms or questions Interventions:  Collaboration with Panosh, Standley Brooking, MD regarding development and update of comprehensive plan of care as evidenced by provider attestation and co-signature Inter-disciplinary care team collaboration (see longitudinal plan of care) Reinforced education to patient about basic DM disease process Reviewed medications with patient and discussed importance of medication adherence Discussed plans with patient for ongoing care management follow up and provided patient with direct contact information for care management team Reinforced instructions on hypoglycemia  and hyperglycemia and importance of correct  treatment Reviewed scheduled/upcoming provider appointments including: Dr. Regis Bill 06/20/21  Reinforced to check cbg daily and record, calling primary care provider for findings outside established parameters.   Review of patient status, including review of consultants reports, relevant laboratory and other test results, and medications completed. Reinforced instructions to wear compression hose, to put on in the morning and remove at night, and to keep her legs elevated as much as possible Reinforced the rule of 15 to treat hypoglycemia. Reviewed to check her CBG when she feels low. Reviewed to notify her doctor if she has frequent episodes of hypoglycemia. Reinforced how her glipizide can cause hypoglycemia is she does not eat enough or if she is more active than usual  Self-Care Activities - Self administers oral medications as prescribed Attends all scheduled provider appointments Checks blood sugars as prescribed and utilize hyper and hypoglycemia protocol as needed Adheres to prescribed ADA/carb modified Patient Goals: - check blood sugar at prescribed times - check blood sugar if I feel it is too high or too low - enter blood sugar readings and medication or insulin into daily log - take the blood sugar log to all doctor visits - drink 6 to 8 glasses of water each day - fill half of plate with vegetables - limit fast food meals to no more than 1 per week - read food labels for fat, fiber, carbohydrates and portion size - keep appointment with eye doctor - check feet daily for cuts, sores or redness - keep feet up while sitting - wear comfortable, well-fitting shoes - check blood sugar at prescribed times - check blood sugar if I feel it is too high or too low - enter blood sugar readings and medication or insulin into daily log - take the blood sugar log to all doctor visits Follow Up Plan: Telephone follow up appointment with care management team member scheduled for: 05/22/21 at  11:30 AM The patient has been provided with contact information for the care management team and has been advised to call with any health related questions or concerns.    Care Plan : RNCM:Chronic Pain (Adult)  Updates made by Dimitri Ped, RN since 05/22/2021 12:00 AM  Completed 05/22/2021   Problem: Chronic Pain Management (Chronic Pain) Resolved 05/22/2021  Priority: High     Long-Range Goal: Chronic Pain Managed Completed 05/22/2021  Start Date: 09/28/2020  Expected End Date: 09/05/2021  Recent Progress: On track  Priority: High  Note:   Resolving due to duplicate goal  Current Barriers:  Knowledge Deficits related to self-health management of chronic pain- lt hip osteoarthritis, degenerative disc disease.  Pt had back surgery 05/02/20, reports still weak and having issues with lt hip and leg pain,  uses walker at all times, reports taking Tramadol at bedtime and Tylenol during the day for pain relief, Daughter assists pt with transportation and her care as needed.  Husband assists if needed and does some  cooking-  States she is to see Dr. Rush Farmer on 10/17 to  discuss if she needs to have hip surgery. States her shoulders have also been aching from using her walker. States she got an injection in her lt hip that helped with the pain for about 2 weeks.    States she does not want to get surgery if she does not need it.  Denies any falls.  States she is taking Miralax as needed for constipation. States she is still  getting home health PT once a week and she tries to do the exercises every day Chronic Disease Management support and education needs related to chronic pain Knowledge Deficits related to self management of chronic pain Chronic Disease Management support and education needs related to self management of chronic pain Unable to independently self manage chronic pain Unable to perform IADLs independently Clinical Goal(s):  patient will verbalize understanding of plan for  pain management. , patient will attend all scheduled medical appointments: Dr.Panosh 06/20/21, patient will demonstrate use of different relaxation  skills and/or diversional activities to assist with pain reduction (distraction, imagery, relaxation, massage, acupressure, TENS, heat, and cold application., patient will report pain at a level less than 3 to 4 on a 10-10 rating scale., patient will use pharmacological and nonpharmacological pain relief strategies as prescribed. , and patient will verbalize acceptable level of pain relief and ability to engage in desired activities Interventions:  Collaboration with Panosh, Standley Brooking, MD regarding development and update of comprehensive plan of care as evidenced by provider attestation and co-signature- Pain assessment performed Medications reviewed Discussed plans with patient for ongoing care management follow up and provided patient with direct contact information for care management team Evaluation of current treatment plan related to self management of chronic pain and patient's adherence to plan as established by provider. Reinforced education to patient re: self management of chronic pain Reviewed medications with patient and discussed use of pain medications-reinforced to take pain medication during the day if she is having more pain Reviewed scheduled/upcoming provider appointments including: Dr. Regis Bill 06/20/21, Dr. Rush Farmer 04/23/21 Discussed plans with patient for ongoing care management follow up and provided patient with direct contact information for care management team Reinforced to continue to do therapy exercises and to use heat and cold applications to help with her chronic pain Reinforced importance of avoiding constipation by use of medications, eating more fiber and drinking adequate amounts of fluids Patient Goals/Self Care Activities:  Will self-administer medications as prescribed Will attend all scheduled provider  appointments Will call pharmacy for medication refills 7 days prior to needed refill date Patient will calls provider office for new concerns or questions - learn relaxation techniques - practice acceptance of chronic pain - practice relaxation or meditation daily - think of new ways to do favorite things - use distraction techniques - use relaxation during pain Follow Up Plan: Telephone follow up appointment with care management team member scheduled for: 05/22/21 at 11:30  AM The patient has been provided with contact information for the care management team and has been advised to call with any health related questions or concerns.       Care Plan : RN Care Manager Plan of Care  Updates made by Dimitri Ped, RN since 05/22/2021 12:00 AM     Problem: Chronic Disease Management and Care Coordination Needs(DM2, chronic pain,HLD)   Priority: High     Long-Range Goal: Establish Plan of Care for Chronic Disease Management Needs( DM2, chronic pain, HLD)  Start Date: 05/22/2021  Expected End Date: 11/18/2021  This Visit's Progress: On track  Priority: High  Note:   Current Barriers:  Care Coordination needs related to ADL IADL limitations and Inability to perform IADL's independently Chronic Disease Management support and education needs related to HLD, DMII, Osteoarthritis, and chronic pain States that she might have her lt hip surgery on 06/22/21 depending on how her husband does.  States he is in rehab after being in the hospital for heart failure and he now has a sore on his foot.  Declines referral to social worker at this time.  States her daughter helps her as needed.  States she has pain and redness on her rt toe and foot that she virtual visit on 05/24/21 with Dr. Regis Bill.  States she called her in an antibiotic but she has not started yet. States her CBGs have been good in the 120's.  Denies any low readings.  States her taste has not been good since she had COVID last month  but she is eating 3 meals a day and tries to eat healthy  RNCM Clinical Goal(s):  Patient will verbalize understanding of plan for management of HLD, DMII, Osteoarthritis, and chronic pain verbalize basic understanding of  HLD, DMII, Osteoarthritis, and chronic pain disease process and self health management plan . take all medications exactly as prescribed and will call provider for medication related questions attend all scheduled medical appointments: Dr. Regis Bill 05/24/21, 06/19/21 demonstrate Improved adherence to prescribed treatment plan for HLD, DMII, Osteoarthritis, and chronic pain as evidenced by readings within limits and adherence to plan of care continue to work with RN Care Manager to address care management and care coordination needs related to  HLD, DMII, Osteoarthritis, and chronic pain  through collaboration with RN Care manager, provider, and care team.   Interventions: 1:1 collaboration with primary care provider regarding development and update of comprehensive plan of care as evidenced by provider attestation and co-signature Inter-disciplinary care team collaboration (see longitudinal plan of care) Evaluation of current treatment plan related to  self management and patient's adherence to plan as established by provider Declines referral to LCSW for caregiver stress   Pain Interventions: Goal on track:  NO. Pain assessment performed Medications reviewed Reviewed provider established plan for pain management; Discussed importance of adherence to all scheduled medical appointments; Counseled on the importance of reporting any/all new or changed pain symptoms or management strategies to pain management provider; Advised patient to report to care team affect of pain on daily activities; Discussed use of relaxation techniques and/or diversional activities to assist with pain reduction (distraction, imagery, relaxation, massage, acupressure, TENS, heat, and cold  application; Advised patient to discuss pain in rt toe with provider;  Hyperlipidemia Interventions: Goal on track:  Yes Medication review performed; medication list updated in electronic medical record.  Provider established cholesterol goals reviewed; Counseled on importance of regular laboratory monitoring as prescribed; Reviewed role and benefits of statin for ASCVD risk reduction; Reviewed importance of limiting foods high in cholesterol;  Diabetes Interventions: Goal on track:  Yes Assessed patient's understanding of A1c goal: <7% Provided education to patient about basic DM disease process; Reviewed medications with patient and discussed importance of medication adherence; Counseled on importance of regular laboratory monitoring as prescribed; Discussed plans with patient for ongoing care management follow up and provided patient with direct contact information for care management team; Provided patient with written educational materials related to hypo and hyperglycemia and importance of correct treatment; Reviewed scheduled/upcoming  provider appointments including: Dr. Regis Bill 05/24/21, 06/19/21; Advised patient, providing education and rationale, to check cbg 2-3 times a week and record, calling provider for findings outside established parameters; Review of patient status, including review of consultants reports, relevant laboratory and other test results, and medications completed; Lab Results  Component Value Date   HGBA1C 6.1 (A) 12/19/2020   Patient Goals/Self-Care Activities: Patient will self administer medications as prescribed Patient will attend all scheduled provider appointments Patient will call pharmacy for medication refills Patient will call provider office for new concerns or questions keep appointment with eye doctor check blood sugar at prescribed times: when you have symptoms of low or high blood sugar and every 2-3 times a week  check feet daily for  cuts, sores or redness enter blood sugar readings and medication or insulin into daily log drink 6 to 8 glasses of water each day fill half of plate with vegetables manage portion size read food labels for fat, fiber, carbohydrates and portion size keep feet up while sitting wash and dry feet carefully every day wear comfortable, cotton socks wear comfortable, well-fitting shoes - call for medicine refill 2 or 3 days before it runs out - take all medications exactly as prescribed - call doctor with any symptoms you believe are related to your medicine - call doctor when you experience any new symptoms - go to all doctor appointments as scheduled Follow Up Plan:  Telephone follow up appointment with care management team member scheduled for:  06/19/21 The patient has been provided with contact information for the care management team and has been advised to call with any health related questions or concerns.         Plan:Telephone follow up appointment with care management team member scheduled for:  06/19/21 The patient has been provided with contact information for the care management team and has been advised to call with any health related questions or concerns.  Peter Garter RN, Jackquline Denmark, CDE Care Management Coordinator [facility] [phone], Mobile [phone]

## 2021-05-22 NOTE — Telephone Encounter (Signed)
Left a message for the pt to return my call.  

## 2021-05-22 NOTE — Patient Instructions (Signed)
Visit Information  Patient will self administer medications as prescribed Patient will attend all scheduled provider appointments Patient will call pharmacy for medication refills Patient will call provider office for new concerns or questions keep appointment with eye doctor check blood sugar at prescribed times: when you have symptoms of low or high blood sugar and every 2-3 times a week  check feet daily for cuts, sores or redness enter blood sugar readings and medication or insulin into daily log drink 6 to 8 glasses of water each day fill half of plate with vegetables manage portion size read food labels for fat, fiber, carbohydrates and portion size keep feet up while sitting wash and dry feet carefully every day wear comfortable, cotton socks wear comfortable, well-fitting shoes - call for medicine refill 2 or 3 days before it runs out - take all medications exactly as prescribed - call doctor with any symptoms you believe are related to your medicine - call doctor when you experience any new symptoms - go to all doctor appointments as scheduled  Patient verbalizes understanding of instructions provided today and agrees to view in [place].   Telephone follow up appointment with care management team member scheduled for: 06/19/21 at 11:30 AM Peter Garter RN, Jackquline Denmark, CDE Care Management Coordinator [facility] [phone], Mobile [phone]

## 2021-05-22 NOTE — Telephone Encounter (Signed)
Patient returned call and scheduled Vv on 11/17

## 2021-05-23 ENCOUNTER — Encounter: Payer: Self-pay | Admitting: Internal Medicine

## 2021-05-23 ENCOUNTER — Other Ambulatory Visit: Payer: Self-pay

## 2021-05-23 ENCOUNTER — Telehealth (INDEPENDENT_AMBULATORY_CARE_PROVIDER_SITE_OTHER): Payer: Medicare Other | Admitting: Internal Medicine

## 2021-05-23 DIAGNOSIS — E1169 Type 2 diabetes mellitus with other specified complication: Secondary | ICD-10-CM

## 2021-05-23 DIAGNOSIS — M79676 Pain in unspecified toe(s): Secondary | ICD-10-CM | POA: Diagnosis not present

## 2021-05-23 MED ORDER — COLCHICINE 0.6 MG PO TABS: 0.6000 mg | ORAL_TABLET | Freq: Two times a day (BID) | ORAL | 1 refills | Status: DC

## 2021-05-23 NOTE — Progress Notes (Signed)
   Virtual Visit via Telephone Note  I connected with@ on 05/23/21 at  4:30 PM EST by telephone and verified that I am speaking with the correct person using two identifiers.   I discussed the limitations, risks, security and privacy concerns of performing an evaluation and management service by telephone and the limited availability of in person appointments. tThere may be a patient responsible charge related to this service. The patient expressed understanding and agreed to proceed.  Location patient: home Location provider: work o office Participants present for the call: patient, provider Patient did not have a visit in the prior 7 days to address this/these issue(s).   History of Present Illness: [name] presents for follow-up of a message about her toe hurting feeling was infection.  She was begun on Augmentin and visit today she prefers as telephone.  It is no worse but not really better very sensitive to touch it had come up overnight no personal history of gout.  No fever blood sugars in the 140s.    Observations/Objective: Patient sounds personable and well on the phone. I do not appreciate any SOB. Speech and thought processing are grossly intact. Patient reported vitals: Lab Results  Component Value Date   WBC 5.1 08/14/2020   HGB 11.7 (L) 08/14/2020   HCT 35.7 (L) 08/14/2020   PLT 274.0 08/14/2020   GLUCOSE 94 08/14/2020   CHOL 180 08/31/2019   TRIG 323.0 (H) 08/31/2019   HDL 35.80 (L) 08/31/2019   LDLDIRECT 81.0 08/31/2019   LDLCALC 83 12/16/2013   ALT 8 08/14/2020   AST 12 08/14/2020   NA 140 08/14/2020   K 4.4 08/14/2020   CL 98 08/14/2020   CREATININE 1.47 (H) 08/14/2020   BUN 26 (H) 08/14/2020   CO2 36 (H) 08/14/2020   TSH 4.48 08/14/2020   INR 1.0 12/25/2019   HGBA1C 6.1 (A) 12/19/2020   MICROALBUR 10.3 (H) 08/31/2019    Assessment and Plan: Toe pain and inflammation under treatment with antibiotic empirically suspect gout is  possible  Although sugars in pretty good control would use prednisone as second line Add colchicine 0.6 twice daily as an anti-inflammatory trial If not improving would refer to podiatry she can contact us about that   Follow Up Instructions:  14 minutes     [id number] 5-10 [id number] 11-20 [id number] 21-30 I did not refer this patient for an OV in the next 24 hours for this/these issue(s). Also spoke with her about her husband who is in rrehab and had foot ulcer  I discussed the assessment and treatment plan with the patient. The patient was provided an opportunity to ask questions and answered. The patient agreed with the plan and demonstrated an understanding of the instructions.   The patient was advised to call back or seek an in-person evaluation if the symptoms worsen or if the condition fails to improve as anticipated.  I provided 14 minutes of non-face-to-face time during this encounter. Return if not improving fu  podatry consult.  Shanon Ace, MD

## 2021-05-24 ENCOUNTER — Telehealth: Payer: Medicare Other | Admitting: Internal Medicine

## 2021-05-27 ENCOUNTER — Other Ambulatory Visit: Payer: Self-pay | Admitting: Internal Medicine

## 2021-06-06 DIAGNOSIS — M1990 Unspecified osteoarthritis, unspecified site: Secondary | ICD-10-CM | POA: Diagnosis not present

## 2021-06-06 DIAGNOSIS — E7800 Pure hypercholesterolemia, unspecified: Secondary | ICD-10-CM | POA: Diagnosis not present

## 2021-06-06 DIAGNOSIS — Z7984 Long term (current) use of oral hypoglycemic drugs: Secondary | ICD-10-CM

## 2021-06-06 DIAGNOSIS — E1169 Type 2 diabetes mellitus with other specified complication: Secondary | ICD-10-CM

## 2021-06-08 NOTE — Progress Notes (Signed)
Sent message, via epic in basket, requesting orders in epic from surgeon.  

## 2021-06-11 ENCOUNTER — Other Ambulatory Visit: Payer: Self-pay | Admitting: Physician Assistant

## 2021-06-11 DIAGNOSIS — M1612 Unilateral primary osteoarthritis, left hip: Secondary | ICD-10-CM

## 2021-06-12 ENCOUNTER — Telehealth: Payer: Self-pay | Admitting: Internal Medicine

## 2021-06-12 NOTE — Telephone Encounter (Signed)
Patient and daughter both wants [place] to tell Dr. Regis Bill that Comer Locket passed away.  They wanted Dr. Regis Bill to know about it.

## 2021-06-15 NOTE — Progress Notes (Signed)
DUE TO COVID-19 ONLY ONE VISITOR IS ALLOWED TO COME WITH YOU AND STAY IN THE WAITING ROOM ONLY DURING PRE OP AND PROCEDURE DAY OF SURGERY.  2 VISITOR  MAY VISIT WITH YOU AFTER SURGERY IN YOUR PRIVATE ROOM DURING VISITING HOURS ONLY!  YOU NEED TO HAVE A COVID 19 TEST ON___12/13/2022 @_  @_from  8am-3pm _____, THIS TEST MUST BE DONE BEFORE SURGERY,  Covid test is done at [place], [place] [address].  This is a drive thru.  No appt required. Please see map.                 Your procedure is scheduled on:  06/22/2021   Report to [facility] Main  Entrance   Report to admitting at    [phone]     Call this number if you have problems the morning of surgery [phone]    REMEMBER: NO  SOLID FOOD CANDY OR GUM AFTER MIDNIGHT. CLEAR LIQUIDS UNTIL 0415am        . NOTHING BY MOUTH EXCEPT CLEAR LIQUIDS UNTIL    0415am   . PLEASE FINISH ENSURE DRINK PER SURGEON ORDER  WHICH NEEDS TO BE COMPLETED AT    0415am   .      CLEAR LIQUID DIET   Foods Allowed                                                                    Coffee and tea, regular and decaf                            Fruit ices (not with fruit pulp)                                      Iced Popsicles                                    Carbonated beverages, regular and diet                                    Cranberry, grape and apple juices Sports drinks like Gatorade Lightly seasoned clear broth or consume(fat free) Sugar, honey syrup ___________________________________________________________________      BRUSH YOUR TEETH MORNING OF SURGERY AND RINSE YOUR MOUTH OUT, NO CHEWING GUM CANDY OR MINTS.     Take these medicines the morning of surgery with A SIP OF WATER:   Colchicine, nexium  DO NOT TAKE ANY DIABETIC MEDICATIONS DAY OF YOUR SURGERY                               You may not have any metal on your body including hair pins and              piercings  Do not wear jewelry, make-up, lotions,  powders or perfumes, deodorant             Do not  wear nail polish on your fingernails.  Do not shave  48 hours prior to surgery.              Men may shave face and neck.   Do not bring valuables to the hospital. [place].  Contacts, dentures or bridgework may not be worn into surgery.  Leave suitcase in the car. After surgery it may be brought to your room.     Patients discharged the day of surgery will not be allowed to drive home. IF YOU ARE HAVING SURGERY AND GOING HOME THE SAME DAY, YOU MUST HAVE AN ADULT TO DRIVE YOU HOME AND BE WITH YOU FOR 24 HOURS. YOU MAY GO HOME BY TAXI OR UBER OR ORTHERWISE, BUT AN ADULT MUST ACCOMPANY YOU HOME AND STAY WITH YOU FOR 24 HOURS.  Name and phone number of your driver:  Special Instructions: N/A              Please read over the following fact sheets you were given: _____________________________________________________________________  [facility] - Preparing for Surgery Before surgery, you can play an important role.  Because skin is not sterile, your skin needs to be as free of germs as possible.  You can reduce the number of germs on your skin by washing with CHG (chlorahexidine gluconate) soap before surgery.  CHG is an antiseptic cleaner which kills germs and bonds with the skin to continue killing germs even after washing. Please DO NOT use if you have an allergy to CHG or antibacterial soaps.  If your skin becomes reddened/irritated stop using the CHG and inform your nurse when you arrive at Short Stay. Do not shave (including legs and underarms) for at least 48 hours prior to the first CHG shower.  You may shave your face/neck. Please follow these instructions carefully:  1.  Shower with CHG Soap the night before surgery and the  morning of Surgery.  2.  If you choose to wash your hair, wash your hair first as usual with your  normal  shampoo.  3.  After you shampoo, rinse your hair and body  thoroughly to remove the  shampoo.                           4.  Use CHG as you would any other liquid soap.  You can apply chg directly  to the skin and wash                       Gently with a scrungie or clean washcloth.  5.  Apply the CHG Soap to your body ONLY FROM THE NECK DOWN.   Do not use on face/ open                           Wound or open sores. Avoid contact with eyes, ears mouth and genitals (private parts).                       Wash face,  Genitals (private parts) with your normal soap.             6.  Wash thoroughly, paying special attention to the area where your surgery  will be performed.  7.  Thoroughly rinse your body with warm water  from the neck down.  8.  DO NOT shower/wash with your normal soap after using and rinsing off  the CHG Soap.                9.  Pat yourself dry with a clean towel.            10.  Wear clean pajamas.            11.  Place clean sheets on your bed the night of your first shower and do not  sleep with pets. Day of Surgery : Do not apply any lotions/deodorants the morning of surgery.  Please wear clean clothes to the hospital/surgery center.  FAILURE TO FOLLOW THESE INSTRUCTIONS MAY RESULT IN THE CANCELLATION OF YOUR SURGERY PATIENT SIGNATURE_________________________________  NURSE SIGNATURE__________________________________  ________________________________________________________________________

## 2021-06-15 NOTE — Progress Notes (Addendum)
Anesthesia Review:  PCP: Shanon Ace Video Visit 05/23/21  Cardiologist : Gwyndolyn Kaufman  LOV 08/21/20  Requested from Almedia Balls any clearances be faxed. No clearances on pt per Sherrie.   Vascuylar- LOV 10/23/20- DR Brabhim  Chest x-ray : EKG :08/21/20  Echo :04/13/2020  Stress test:04/13/2020  Cardiac Cath :  Activity level: cannot do a flight of stairs without difficulty  Sleep Study/ CPAP : none  Fasting Blood Sugar :      / Checks Blood Sugar -- times a day:   Blood Thinner/ Instructions /Last Dose: ASA / Instructions/ Last Dose :   81 mg Aspirin  DM- type 2 Hgb1c- 06/18/21 - 7.1  Checks glucose once in a while per pt  Covid tst on 2021/07/08  PT husband deceased 2 weeks ago.  Suprapubic in place since back surgery.   Daughter with pt at preop appt.

## 2021-06-18 ENCOUNTER — Encounter (HOSPITAL_COMMUNITY)
Admission: RE | Admit: 2021-06-18 | Discharge: 2021-06-18 | Disposition: A | Discharge status: Home / Self Care | Payer: Medicare Other | Source: Ambulatory Visit | Attending: Orthopaedic Surgery | Admitting: Orthopaedic Surgery

## 2021-06-18 ENCOUNTER — Encounter (HOSPITAL_COMMUNITY): Payer: Self-pay

## 2021-06-18 ENCOUNTER — Other Ambulatory Visit: Payer: Self-pay

## 2021-06-18 VITALS — BP 140/68 | HR 105 | Temp 98.5°F | Resp 16 | Ht 63.0 in

## 2021-06-18 DIAGNOSIS — M1612 Unilateral primary osteoarthritis, left hip: Secondary | ICD-10-CM

## 2021-06-18 DIAGNOSIS — Z01812 Encounter for preprocedural laboratory examination: Secondary | ICD-10-CM | POA: Diagnosis not present

## 2021-06-18 DIAGNOSIS — Z01818 Encounter for other preprocedural examination: Secondary | ICD-10-CM

## 2021-06-18 DIAGNOSIS — E1165 Type 2 diabetes mellitus with hyperglycemia: Secondary | ICD-10-CM

## 2021-06-18 LAB — CBC
HCT: 37.1 % (ref 36.0–46.0)
Hemoglobin: 12 g/dL (ref 12.0–15.0)
MCH: 28.5 pg (ref 26.0–34.0)
MCHC: 32.3 g/dL (ref 30.0–36.0)
MCV: 88.1 fL (ref 80.0–100.0)
Platelets: 284 10*3/uL (ref 150–400)
RBC: 4.21 MIL/uL (ref 3.87–5.11)
RDW: 14 % (ref 11.5–15.5)
WBC: 3.8 10*3/uL — ABNORMAL LOW (ref 4.0–10.5)
nRBC: 0 % (ref 0.0–0.2)

## 2021-06-18 LAB — BASIC METABOLIC PANEL
Anion gap: 9 (ref 5–15)
BUN: 32 mg/dL — ABNORMAL HIGH (ref 8–23)
CO2: 28 mmol/L (ref 22–32)
Calcium: 9.2 mg/dL (ref 8.9–10.3)
Chloride: 96 mmol/L — ABNORMAL LOW (ref 98–111)
Creatinine, Ser: 1.33 mg/dL — ABNORMAL HIGH (ref 0.44–1.00)
GFR, Estimated: 38 mL/min — ABNORMAL LOW (ref >60)
Glucose, Bld: 164 mg/dL — ABNORMAL HIGH (ref 70–99)
Potassium: 3.9 mmol/L (ref 3.5–5.1)
Sodium: 133 mmol/L — ABNORMAL LOW (ref 135–145)

## 2021-06-18 LAB — SURGICAL PCR SCREEN
MRSA, PCR: NEGATIVE
Staphylococcus aureus: POSITIVE — AB

## 2021-06-18 LAB — GLUCOSE, CAPILLARY: Glucose-Capillary: 180 mg/dL — ABNORMAL HIGH (ref 70–99)

## 2021-06-18 LAB — HEMOGLOBIN A1C
Hgb A1c MFr Bld: 7.1 % — ABNORMAL HIGH (ref 4.8–5.6)
Mean Plasma Glucose: 157.07 mg/dL

## 2021-06-19 ENCOUNTER — Other Ambulatory Visit: Payer: Self-pay | Admitting: Orthopaedic Surgery

## 2021-06-19 ENCOUNTER — Telehealth: Payer: Medicare Other

## 2021-06-20 ENCOUNTER — Ambulatory Visit: Payer: Medicare Other | Admitting: Internal Medicine

## 2021-06-20 LAB — SARS CORONAVIRUS 2 (TAT 6-24 HRS): SARS Coronavirus 2: NEGATIVE

## 2021-06-20 NOTE — Telephone Encounter (Signed)
Condolences   Let us know  if we can help  in any way .

## 2021-06-21 ENCOUNTER — Telehealth: Payer: Self-pay | Admitting: *Deleted

## 2021-06-21 NOTE — Care Plan (Signed)
RNCM call to patient's daughter, who helps with care for her mother, to discuss her upcoming Left total hip arthroplasty with Dr. Ninfa Linden. She will be an Ortho bundle patient through [facility] and is agreeable to case management. CM and daughter, [name], had discussion about discharge plan. Daughter feels that mother would be more comfortable at [facility] for STR after hospitalization. Will assess how she does with PT at hospital after surgery to determine discharge planning needs. She has all DME needed at home already. Daughter requested [facility] if SNF is needed.Referral to HHPT with [facility] also made after choice provided in case patient does goes home with assistance from family. Will continue to follow for needs.

## 2021-06-21 NOTE — Telephone Encounter (Signed)
Left a message for the pt to return my call.  

## 2021-06-21 NOTE — Anesthesia Preprocedure Evaluation (Addendum)
Anesthesia Evaluation  Patient identified by MRN, date of birth, ID band Patient awake    Reviewed: Allergy & Precautions, NPO status , Patient's Chart, lab work & pertinent test results  Airway Mallampati: III  TM Distance: >3 FB Neck ROM: Full  Mouth opening: Limited Mouth Opening  Dental  (+) Edentulous Upper, Upper Dentures, Partial Lower   Pulmonary neg pulmonary ROS,    Pulmonary exam normal breath sounds clear to auscultation       Cardiovascular hypertension, Pt. on medications Normal cardiovascular exam Rhythm:Regular Rate:Normal  TTE 2021 1. Left ventricular ejection fraction, by estimation, is 60 to 65%. The  left ventricle has normal function. The left ventricle has no regional  wall motion abnormalities. There is mild concentric left ventricular  hypertrophy of the basal-septal segment.  Left ventricular diastolic parameters are consistent with Grade I  diastolic dysfunction (impaired relaxation).  2. Right ventricular systolic function is normal. The right ventricular  size is normal. There is normal pulmonary artery systolic pressure.  3. Average HR 91 bpm.. The mitral valve is abnormal. No evidence of  mitral valve regurgitation. Moderate mitral annular calcification.  4. Left atrial size was mildly dilated.  5. The aortic valve is tricuspid. There is moderate calcification of the  aortic valve. Aortic valve regurgitation is not visualized. Mild aortic  valve sclerosis is present, with no evidence of aortic valve stenosis.  6. Aortic Normal Ascending Ao.  7. The inferior vena cava is normal in size with greater than 50%  respiratory variability, suggesting right atrial pressure of 3 mmHg.   Stress Test 2021 Unremarkable  Known RBBB   Neuro/Psych PSYCHIATRIC DISORDERS Depression negative neurological ROS     GI/Hepatic Neg liver ROS, hiatal hernia, GERD  Controlled and Medicated,  Endo/Other   diabetes, Type 2, Oral Hypoglycemic AgentsHypothyroidism   Renal/GU Renal InsufficiencyRenal diseaseLab Results      Component                Value               Date                      CREATININE               1.33 (H)            06/18/2021                BUN                      32 (H)              06/18/2021                NA                       133 (L)             06/18/2021                K                        3.9                 06/18/2021                CL  96 (L)              06/18/2021                CO2                      28                  06/18/2021             negative genitourinary   Musculoskeletal  (+) Arthritis ,   Abdominal   Peds  Hematology negative hematology ROS (+)   Anesthesia Other Findings   Reproductive/Obstetrics                           Anesthesia Physical Anesthesia Plan  ASA: 3  Anesthesia Plan: Spinal   Post-op Pain Management: Tylenol PO (pre-op)   Induction:   PONV Risk Score and Plan: 2 and Treatment may vary due to age or medical condition, Propofol infusion, Ondansetron and Dexamethasone  Airway Management Planned: Natural Airway  Additional Equipment:   Intra-op Plan:   Post-operative Plan:   Informed Consent: I have reviewed the patients History and Physical, chart, labs and discussed the procedure including the risks, benefits and alternatives for the proposed anesthesia with the patient or authorized representative who has indicated his/her understanding and acceptance.     Dental advisory given  Plan Discussed with: CRNA  Anesthesia Plan Comments:        Anesthesia Quick Evaluation

## 2021-06-21 NOTE — Telephone Encounter (Addendum)
Festus Barren is returning your call

## 2021-06-21 NOTE — Telephone Encounter (Signed)
Pt's daughter informed of the message and verbalized understanding. Pt's daughter stated the pt is having hip surgery tomorrow.

## 2021-06-21 NOTE — H&P (Signed)
TOTAL HIP ADMISSION H&P  Patient is admitted for left total hip arthroplasty.  Subjective:  Chief Complaint: left hip pain  HPI: [name], 87 y.o. female, has a history of pain and functional disability in the left hip(s) due to arthritis and patient has failed non-surgical conservative treatments for greater than 12 weeks to include corticosteriod injections, use of assistive devices, and activity modification.  Onset of symptoms was gradual starting 3 years ago with gradually worsening course since that time.The patient noted no past surgery on the left hip(s).  Patient currently rates pain in the left hip at 10 out of 10 with activity. Patient has night pain, worsening of pain with activity and weight bearing, trendelenberg gait, pain that interfers with activities of daily living, and pain with passive range of motion. Patient has evidence of subchondral sclerosis, periarticular osteophytes, joint space narrowing, and protrusio  by imaging studies. This condition presents safety issues increasing the risk of falls.  There is no current active infection.  Patient Active Problem List   Diagnosis Date Noted   Unilateral primary osteoarthritis, left hip 04/23/2021   Peripheral edema 07/11/2020   Sleep disturbance 06/12/2020   Drug induced constipation    Neurogenic bladder    Constipation    Hypoalbuminemia due to protein-calorie malnutrition (HCC)    Benign essential HTN    Stage 3b chronic kidney disease (HCC)    Chronic pain syndrome    Controlled type 2 diabetes mellitus with hyperglycemia, without long-term current use of insulin (HCC)    Lumbar radiculopathy 05/02/2020   Spondylolisthesis of lumbar region 04/24/2020   Swelling of left lower extremity 02/18/2020   Urinary retention with incomplete bladder emptying    Hip pain    Severe back pain    AKI (acute kidney injury) ([place])    Intractable pain/ left hip osteoarthritis 12/25/2019   Spinal stenosis, lumbar region, with  neurogenic claudication 03/06/2016   Malignant neoplasm of right female breast ([place]) 01/11/2016   Controlled type 2 diabetes mellitus without complication, without long-term current use of insulin ([place]) 01/11/2016   Genetic testing 08/18/2015   Family history of breast cancer    Family history of prostate cancer    Family history of pancreatic cancer    Breast cancer of upper-outer quadrant of right female breast ([place]) 07/21/2015   Insomnia 05/06/2015   Medication management 12/16/2013   Hand arthritis 12/16/2013   Rectal bleeding 12/16/2013   Cold intolerance 06/17/2013   Renal insufficiency 12/23/2012   Post-menopause 12/23/2012   Hyperlipidemia 12/23/2012   Postmenopausal HRT (hormone replacement therapy) 12/23/2012   History of shingles 12/23/2012   Wears hearing aid 12/23/2012   Nonspecific abnormal unspecified cardiovascular function study 02/28/2012   Chronic sore throat 01/13/2012   Hoarse 01/13/2012   Chest pressure 12/03/2011   Itching  mid back 12/03/2011   Abnormal EKG 12/03/2011   Medicare annual wellness visit, subsequent 12/03/2011   DJD (degenerative joint disease) 12/03/2011   DIABETES MELLITUS, TYPE II, CONTROLLED 11/15/2009   VITAMIN D DEFICIENCY 11/15/2009   EXOGENOUS OBESITY 11/15/2009   ANXIETY DEPRESSION 12/22/2008   HYPERTHYROIDISM 08/23/2008   TOTAL KNEE REPLACEMENT, LEFT, HX OF 08/23/2008   Hypothyroidism 10/08/2007   ANEMIA 10/08/2007   Osteoarthritis 10/08/2007   OSTEOPENIA 10/08/2007   Dyslipidemia 11/07/2006   GERD (gastroesophageal reflux disease) 11/07/2006   Past Medical History:  Diagnosis Date   Arthritis    Bilateral edema of lower extremity    Breast cancer of upper-outer quadrant of right female  breast [facility]) dx 07/19/2015--- oncologist-  dr Lindi Adie dr kinard   DCIS,  grade 3, 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 , HER2/neu negative-- s/p right lumpectomy (without SLNB) and radiation therapy   Chronic lower back pain    DDD (degenerative  disc disease)    lumbar   Diverticulosis    Family history of breast cancer    Family history of pancreatic cancer    Family history of prostate cancer    Flaccid neuropathic bladder, not elsewhere classified    Foley catheter in place    GERD (gastroesophageal reflux disease)    Hemorrhoids    Hiatal hernia    History of colon polyps    History of radiation therapy 09-12-2015 to 10-12-2015   42.72 gray in 16 fractions directed right breast w/ boost of 12 gray in 6 fractions directed at the lumpectomy cavity- Total dose: 54.72y   History of recurrent UTIs    Hyperlipidemia    RBBB (right bundle branch block with left anterior fascicular block)    Type 2 diabetes mellitus ([place])    Urinary retention with incomplete bladder emptying    Wears dentures    full upper and lower partial   Wears glasses    Wears hearing aid    bilateral but does not wear    Past Surgical History:  Procedure Laterality Date   BACK SURGERY     BREAST LUMPECTOMY WITH RADIOACTIVE SEED LOCALIZATION Right 08/03/2015   Procedure: BREAST LUMPECTOMY WITH RADIOACTIVE SEED LOCALIZATION;  Surgeon: [name], MD;  Location: [place];  Service: General;  Laterality: Right;   BREAST SURGERY     CARDIOVASCULAR STRESS TEST  01/14/2012   Low risk nuclear study w/ small mild apical and apical lateral reversible perfusion defect represents a small area of ischemia versus shifting breast artifact/  normal LV funciton and wall motion, ef 86%   CARPAL TUNNEL RELEASE Right 1977   CATARACT EXTRACTION W/ INTRAOCULAR LENS  IMPLANT, BILATERAL Bilateral left 1996/  right 1997   CLOSED RIGHT KNEE MANIPULATION  08/11/2002   post TKA   CYSTOSTOMY N/A 05/17/2016   Procedure: CYSTOSCOPY WITH  SUPRAPUBIC PLACMENT;  Surgeon: [name], MD;  Location: [place];  Service: Urology;  Laterality: N/A;   EYE SURGERY     GANGLION CYST EXCISION Right 12/09/2001   right palm   HERNIA REPAIR      JOINT REPLACEMENT     KNEE ARTHROSCOPY Right 12/14/2002   w/ Lysis Adhesions   LAMINECTOMY  04/24/2020   Bilateral redo L2-3, L3-4 and L4-5 laminotomy/laminectomy/foraminotomies/medial facetectomy to decompress the bilateral L3, L4 and L5 nerve roots   LUMBAR LAMINECTOMY/DECOMPRESSION MICRODISCECTOMY N/A 03/06/2016   Procedure: CENTRAL DECOMPRESSION L3 - L4 ,L4 - L5;  Surgeon: [name], MD;  Location: [facility];  Service: Orthopedics;  Laterality: N/A;   SHOULDER HEMI-ARTHROPLASTY Right 02/20/2009   avascular necrosis    TOTAL KNEE ARTHROPLASTY Bilateral right 06-23-2002/  left [phone]   UMBILICAL HERNIA REPAIR  1958   VAGINAL HYSTERECTOMY  1975    No current facility-administered medications for this encounter.   Current Outpatient Medications  Medication Sig Dispense Refill Last Dose   acetaminophen (TYLENOL) 650 MG CR tablet Take 650 mg by mouth every 8 (eight) hours as needed for pain.      aspirin EC 81 MG tablet Take 81 mg by mouth daily at 12 noon.       atorvastatin (LIPITOR) 10 MG tablet TAKE 1 TABLET DAILY  90 tablet 1    cholecalciferol (VITAMIN D3) 25 MCG (1000 UNIT) tablet Take 1,000 Units by mouth daily.      colchicine 0.6 MG tablet Take 1 tablet (0.6 mg total) by mouth 2 (two) times daily. For probable gout, stop atorvastatin while  on this medication 30 tablet 1    cyanocobalamin 1000 MCG tablet Take 1,000 mcg by mouth at bedtime.       docusate sodium (COLACE) 100 MG capsule Take 1 capsule (100 mg total) by mouth 2 (two) times daily. (Patient taking differently: Take 100 mg by mouth daily.) 60 capsule 0    esomeprazole (NEXIUM) 40 MG capsule TAKE 1 CAPSULE DAILY 90 capsule 1    furosemide (LASIX) 40 MG tablet TAKE 1 TABLET DAILY OR AS DIRECTED 90 tablet 1    glipiZIDE (GLUCOTROL) 10 MG tablet TAKE 1 TABLET IN THE MORNING 90 tablet 1    MOVANTIK 25 MG TABS tablet TAKE 1 TABLET(25 MG) BY MOUTH DAILY 30 tablet 1    senna-docusate (SENOKOT-S) 8.6-50 MG tablet Take 2  tablets by mouth 2 (two) times daily. 120 tablet 2    traMADol (ULTRAM) 50 MG tablet Take 2 tablets (100 mg total) by mouth every 6 (six) hours as needed for moderate pain. (Patient taking differently: Take 50 mg by mouth at bedtime.) 60 tablet 0    Trospium Chloride 60 MG CP24 Take 60 mg by mouth daily.      ALPRAZolam (XANAX) 1 MG tablet Take 0.5-1 tablets (0.5-1 mg total) by mouth at bedtime as needed for anxiety. Avoid regular use. (Patient not taking: Reported on 06/13/2021) 20 tablet 0 Not Taking   amoxicillin-clavulanate (AUGMENTIN) 875-125 MG tablet Take 1 tablet by mouth every 12 (twelve) hours. (Patient not taking: Reported on 06/13/2021) 14 tablet 0 Not Taking   Allergies  Allergen Reactions   Phenergan [Promethazine]     Slurred speech, acted very confused   Remeron [Mirtazapine] Itching    Social History   Tobacco Use   Smoking status: Never   Smokeless tobacco: Never  Substance Use Topics   Alcohol use: No    Family History  Problem Relation Age of Onset   Pancreatic cancer Mother 12   Diabetes Mother    Prostate cancer Brother    Diabetes Father    Heart disease Father    Diabetes Brother    Heart attack Brother    Diabetes Sister    Diabetes Daughter    Diabetes Son    Coronary artery disease Brother        MI in his 56s   Colon cancer Neg Hx    Stomach cancer Neg Hx      Review of Systems  Genitourinary:        Chronic indwelling foley catheter  Musculoskeletal:  Positive for back pain and gait problem.  All other systems reviewed and are negative.  Objective:  Physical Exam Vitals reviewed.  Constitutional:      Appearance: Normal appearance.  HENT:     Head: Normocephalic and atraumatic.  Eyes:     Extraocular Movements: Extraocular movements intact.     Pupils: Pupils are equal, round, and reactive to light.  Cardiovascular:     Rate and Rhythm: Normal rate.  Pulmonary:     Effort: Pulmonary effort is normal.     Breath sounds: Normal  breath sounds.  Abdominal:     Palpations: Abdomen is soft.  Musculoskeletal:     Cervical back: Normal range of  motion and neck supple.     Left hip: Tenderness and bony tenderness present. Decreased range of motion. Decreased strength.  Neurological:     Mental Status: She is alert and oriented to person, place, and time.  Psychiatric:        Behavior: Behavior normal.    Vital signs in last 24 hours:    Labs:   Estimated body mass index is 24.8 kg/m as calculated from the following:   Height as of 06/18/21: 5' 3"  (1.6 m).   Weight as of 04/23/21: 63.5 kg.   Imaging Review Plain radiographs demonstrate severe degenerative joint disease of the left hip(s). The bone quality appears to be fair for age and reported activity level.      Assessment/Plan:  End stage arthritis, left hip(s)  The patient history, physical examination, clinical judgement of the provider and imaging studies are consistent with end stage degenerative joint disease of the left hip(s) and total hip arthroplasty is deemed medically necessary. The treatment options including medical management, injection therapy, arthroscopy and arthroplasty were discussed at length. The risks and benefits of total hip arthroplasty were presented and reviewed. The risks due to aseptic loosening, infection, stiffness, dislocation/subluxation,  thromboembolic complications and other imponderables were discussed.  The patient acknowledged the explanation, agreed to proceed with the plan and consent was signed. Patient is being admitted for inpatient treatment for surgery, pain control, PT, OT, prophylactic antibiotics, VTE prophylaxis, progressive ambulation and ADL's and discharge planning.The patient is planning to be discharged to skilled nursing facility vs HHPT.

## 2021-06-21 NOTE — Telephone Encounter (Signed)
Ortho bundle pre-op call completed. 

## 2021-06-22 ENCOUNTER — Inpatient Hospital Stay (HOSPITAL_COMMUNITY)
Admission: RE | Admit: 2021-06-22 | Discharge: 2021-06-25 | DRG: 470 | Disposition: A | Discharge status: Skilled Nursing Facility | Payer: Medicare Other | Attending: Orthopaedic Surgery | Admitting: Orthopaedic Surgery

## 2021-06-22 ENCOUNTER — Ambulatory Visit (HOSPITAL_COMMUNITY): Payer: Medicare Other | Admitting: Anesthesiology

## 2021-06-22 ENCOUNTER — Observation Stay (HOSPITAL_COMMUNITY): Payer: Medicare Other

## 2021-06-22 ENCOUNTER — Ambulatory Visit (HOSPITAL_COMMUNITY): Payer: Medicare Other

## 2021-06-22 ENCOUNTER — Encounter (HOSPITAL_COMMUNITY)
Admission: RE | Disposition: A | Discharge status: Skilled Nursing Facility | Payer: Self-pay | Source: Home / Self Care | Attending: Orthopaedic Surgery

## 2021-06-22 ENCOUNTER — Encounter (HOSPITAL_COMMUNITY): Payer: Self-pay | Admitting: Orthopaedic Surgery

## 2021-06-22 ENCOUNTER — Ambulatory Visit (HOSPITAL_COMMUNITY): Payer: Medicare Other | Admitting: Physician Assistant

## 2021-06-22 ENCOUNTER — Other Ambulatory Visit: Payer: Self-pay

## 2021-06-22 DIAGNOSIS — F419 Anxiety disorder, unspecified: Secondary | ICD-10-CM | POA: Diagnosis not present

## 2021-06-22 DIAGNOSIS — M1612 Unilateral primary osteoarthritis, left hip: Secondary | ICD-10-CM | POA: Diagnosis not present

## 2021-06-22 DIAGNOSIS — M8580 Other specified disorders of bone density and structure, unspecified site: Secondary | ICD-10-CM | POA: Diagnosis present

## 2021-06-22 DIAGNOSIS — I129 Hypertensive chronic kidney disease with stage 1 through stage 4 chronic kidney disease, or unspecified chronic kidney disease: Secondary | ICD-10-CM | POA: Diagnosis present

## 2021-06-22 DIAGNOSIS — Z803 Family history of malignant neoplasm of breast: Secondary | ICD-10-CM | POA: Diagnosis not present

## 2021-06-22 DIAGNOSIS — Z8719 Personal history of other diseases of the digestive system: Secondary | ICD-10-CM | POA: Diagnosis not present

## 2021-06-22 DIAGNOSIS — Z8619 Personal history of other infectious and parasitic diseases: Secondary | ICD-10-CM

## 2021-06-22 DIAGNOSIS — Z7984 Long term (current) use of oral hypoglycemic drugs: Secondary | ICD-10-CM | POA: Diagnosis not present

## 2021-06-22 DIAGNOSIS — M1611 Unilateral primary osteoarthritis, right hip: Secondary | ICD-10-CM | POA: Diagnosis not present

## 2021-06-22 DIAGNOSIS — Z96653 Presence of artificial knee joint, bilateral: Secondary | ICD-10-CM | POA: Diagnosis present

## 2021-06-22 DIAGNOSIS — Z96642 Presence of left artificial hip joint: Secondary | ICD-10-CM

## 2021-06-22 DIAGNOSIS — R54 Age-related physical debility: Secondary | ICD-10-CM | POA: Diagnosis not present

## 2021-06-22 DIAGNOSIS — R531 Weakness: Secondary | ICD-10-CM | POA: Diagnosis not present

## 2021-06-22 DIAGNOSIS — E039 Hypothyroidism, unspecified: Secondary | ICD-10-CM | POA: Diagnosis not present

## 2021-06-22 DIAGNOSIS — M519 Unspecified thoracic, thoracolumbar and lumbosacral intervertebral disc disorder: Secondary | ICD-10-CM | POA: Diagnosis not present

## 2021-06-22 DIAGNOSIS — K219 Gastro-esophageal reflux disease without esophagitis: Secondary | ICD-10-CM | POA: Diagnosis present

## 2021-06-22 DIAGNOSIS — N1832 Chronic kidney disease, stage 3b: Secondary | ICD-10-CM | POA: Diagnosis present

## 2021-06-22 DIAGNOSIS — M247 Protrusio acetabuli: Secondary | ICD-10-CM | POA: Diagnosis not present

## 2021-06-22 DIAGNOSIS — Z87440 Personal history of urinary (tract) infections: Secondary | ICD-10-CM | POA: Diagnosis not present

## 2021-06-22 DIAGNOSIS — E1122 Type 2 diabetes mellitus with diabetic chronic kidney disease: Secondary | ICD-10-CM | POA: Diagnosis not present

## 2021-06-22 DIAGNOSIS — E559 Vitamin D deficiency, unspecified: Secondary | ICD-10-CM | POA: Diagnosis not present

## 2021-06-22 DIAGNOSIS — Z853 Personal history of malignant neoplasm of breast: Secondary | ICD-10-CM | POA: Diagnosis not present

## 2021-06-22 DIAGNOSIS — Z8249 Family history of ischemic heart disease and other diseases of the circulatory system: Secondary | ICD-10-CM

## 2021-06-22 DIAGNOSIS — Z7982 Long term (current) use of aspirin: Secondary | ICD-10-CM

## 2021-06-22 DIAGNOSIS — Z923 Personal history of irradiation: Secondary | ICD-10-CM | POA: Diagnosis not present

## 2021-06-22 DIAGNOSIS — Z800 Family history of malignant neoplasm of digestive organs: Secondary | ICD-10-CM

## 2021-06-22 DIAGNOSIS — Z79899 Other long term (current) drug therapy: Secondary | ICD-10-CM

## 2021-06-22 DIAGNOSIS — M48062 Spinal stenosis, lumbar region with neurogenic claudication: Secondary | ICD-10-CM | POA: Diagnosis not present

## 2021-06-22 DIAGNOSIS — E785 Hyperlipidemia, unspecified: Secondary | ICD-10-CM | POA: Diagnosis not present

## 2021-06-22 DIAGNOSIS — D62 Acute posthemorrhagic anemia: Secondary | ICD-10-CM | POA: Diagnosis not present

## 2021-06-22 DIAGNOSIS — Z833 Family history of diabetes mellitus: Secondary | ICD-10-CM

## 2021-06-22 DIAGNOSIS — K5903 Drug induced constipation: Secondary | ICD-10-CM | POA: Diagnosis not present

## 2021-06-22 DIAGNOSIS — Z8042 Family history of malignant neoplasm of prostate: Secondary | ICD-10-CM

## 2021-06-22 DIAGNOSIS — Z20822 Contact with and (suspected) exposure to covid-19: Secondary | ICD-10-CM | POA: Diagnosis present

## 2021-06-22 DIAGNOSIS — R339 Retention of urine, unspecified: Secondary | ICD-10-CM | POA: Diagnosis not present

## 2021-06-22 DIAGNOSIS — Z466 Encounter for fitting and adjustment of urinary device: Secondary | ICD-10-CM | POA: Diagnosis not present

## 2021-06-22 DIAGNOSIS — Z7401 Bed confinement status: Secondary | ICD-10-CM | POA: Diagnosis not present

## 2021-06-22 DIAGNOSIS — G894 Chronic pain syndrome: Secondary | ICD-10-CM | POA: Diagnosis present

## 2021-06-22 DIAGNOSIS — G8918 Other acute postprocedural pain: Secondary | ICD-10-CM | POA: Diagnosis not present

## 2021-06-22 DIAGNOSIS — N179 Acute kidney failure, unspecified: Secondary | ICD-10-CM | POA: Diagnosis not present

## 2021-06-22 DIAGNOSIS — Z471 Aftercare following joint replacement surgery: Secondary | ICD-10-CM | POA: Diagnosis not present

## 2021-06-22 DIAGNOSIS — N319 Neuromuscular dysfunction of bladder, unspecified: Secondary | ICD-10-CM | POA: Diagnosis not present

## 2021-06-22 DIAGNOSIS — M25559 Pain in unspecified hip: Secondary | ICD-10-CM

## 2021-06-22 DIAGNOSIS — D649 Anemia, unspecified: Secondary | ICD-10-CM | POA: Diagnosis not present

## 2021-06-22 HISTORY — PX: TOTAL HIP ARTHROPLASTY: SHX124

## 2021-06-22 LAB — GLUCOSE, CAPILLARY
Glucose-Capillary: 156 mg/dL — ABNORMAL HIGH (ref 70–99)
Glucose-Capillary: 143 mg/dL — ABNORMAL HIGH (ref 70–99)

## 2021-06-22 SURGERY — ARTHROPLASTY, HIP, TOTAL, ANTERIOR APPROACH
Anesthesia: Spinal | Site: Hip | Laterality: Left

## 2021-06-22 MED ORDER — LIDOCAINE HCL (PF) 2 % IJ SOLN
INTRAMUSCULAR | Status: AC
  Filled 2021-06-22: qty 5

## 2021-06-22 MED ORDER — METHOCARBAMOL 500 MG PO TABS
500.0000 mg | ORAL_TABLET | Freq: Four times a day (QID) | ORAL | Status: DC | PRN
  Administered 2021-06-22 – 2021-06-24 (×6): 500 mg via ORAL
  Filled 2021-06-22 (×7): qty 1

## 2021-06-22 MED ORDER — FENTANYL CITRATE (PF) 100 MCG/2ML IJ SOLN
INTRAMUSCULAR | Status: AC
  Filled 2021-06-22: qty 2

## 2021-06-22 MED ORDER — PHENYLEPHRINE 40 MCG/ML (10ML) SYRINGE FOR IV PUSH (FOR BLOOD PRESSURE SUPPORT)
PREFILLED_SYRINGE | INTRAVENOUS | Status: AC
  Filled 2021-06-22: qty 10

## 2021-06-22 MED ORDER — DEXAMETHASONE SODIUM PHOSPHATE 10 MG/ML IJ SOLN
INTRAMUSCULAR | Status: DC | PRN
  Administered 2021-06-22: 5 mg via INTRAVENOUS

## 2021-06-22 MED ORDER — ATORVASTATIN CALCIUM 10 MG PO TABS
10.0000 mg | ORAL_TABLET | Freq: Every day | ORAL | Status: DC
  Administered 2021-06-23 – 2021-06-25 (×3): 10 mg via ORAL
  Filled 2021-06-22 (×3): qty 1

## 2021-06-22 MED ORDER — HYDROCODONE-ACETAMINOPHEN 7.5-325 MG PO TABS
1.0000 | ORAL_TABLET | ORAL | Status: DC | PRN
  Administered 2021-06-23 – 2021-06-24 (×6): 2 via ORAL
  Filled 2021-06-22 (×6): qty 2

## 2021-06-22 MED ORDER — PHENYLEPHRINE HCL-NACL 20-0.9 MG/250ML-% IV SOLN
INTRAVENOUS | Status: DC | PRN
  Administered 2021-06-22: 50 ug/min via INTRAVENOUS

## 2021-06-22 MED ORDER — BUPIVACAINE IN DEXTROSE 0.75-8.25 % IT SOLN
INTRATHECAL | Status: DC | PRN
  Administered 2021-06-22: 1.6 mL via INTRATHECAL

## 2021-06-22 MED ORDER — ALUM & MAG HYDROXIDE-SIMETH 200-200-20 MG/5ML PO SUSP: 30.0000 mL | ORAL | Status: DC | PRN

## 2021-06-22 MED ORDER — TRANEXAMIC ACID-NACL 1000-0.7 MG/100ML-% IV SOLN
1000.0000 mg | INTRAVENOUS | Status: AC
  Administered 2021-06-22: 1000 mg via INTRAVENOUS
  Filled 2021-06-22: qty 100

## 2021-06-22 MED ORDER — FUROSEMIDE 40 MG PO TABS
40.0000 mg | ORAL_TABLET | Freq: Every day | ORAL | Status: DC
  Administered 2021-06-23 – 2021-06-25 (×3): 40 mg via ORAL
  Filled 2021-06-22 (×3): qty 1

## 2021-06-22 MED ORDER — ASPIRIN 81 MG PO CHEW
81.0000 mg | CHEWABLE_TABLET | Freq: Two times a day (BID) | ORAL | Status: DC
  Administered 2021-06-22 – 2021-06-25 (×6): 81 mg via ORAL
  Filled 2021-06-22 (×6): qty 1

## 2021-06-22 MED ORDER — DEXAMETHASONE SODIUM PHOSPHATE 10 MG/ML IJ SOLN
INTRAMUSCULAR | Status: AC
  Filled 2021-06-22: qty 1

## 2021-06-22 MED ORDER — DOCUSATE SODIUM 100 MG PO CAPS
100.0000 mg | ORAL_CAPSULE | Freq: Two times a day (BID) | ORAL | Status: DC
  Administered 2021-06-23 – 2021-06-25 (×5): 100 mg via ORAL
  Filled 2021-06-22 (×6): qty 1

## 2021-06-22 MED ORDER — SODIUM CHLORIDE 0.9 % IV BOLUS
500.0000 mL | Freq: Once | INTRAVENOUS | Status: AC
Start: 2021-06-22 — End: 2021-06-22
  Administered 2021-06-22: 500 mL via INTRAVENOUS

## 2021-06-22 MED ORDER — METHOCARBAMOL 500 MG IVPB - SIMPLE MED
INTRAVENOUS | Status: AC
  Administered 2021-06-22: 500 mg via INTRAVENOUS
  Filled 2021-06-22: qty 50

## 2021-06-22 MED ORDER — ACETAMINOPHEN 325 MG PO TABS: 325.0000 mg | ORAL_TABLET | Freq: Four times a day (QID) | ORAL | Status: DC | PRN

## 2021-06-22 MED ORDER — SODIUM CHLORIDE 0.9 % IV SOLN: INTRAVENOUS | Status: DC

## 2021-06-22 MED ORDER — PHENYLEPHRINE HCL-NACL 20-0.9 MG/250ML-% IV SOLN
INTRAVENOUS | Status: AC
  Filled 2021-06-22: qty 250

## 2021-06-22 MED ORDER — LIDOCAINE HCL (CARDIAC) PF 100 MG/5ML IV SOSY
PREFILLED_SYRINGE | INTRAVENOUS | Status: DC | PRN
  Administered 2021-06-22: 60 mg via INTRAVENOUS

## 2021-06-22 MED ORDER — PROPOFOL 500 MG/50ML IV EMUL
INTRAVENOUS | Status: DC | PRN
  Administered 2021-06-22: 50 ug/kg/min via INTRAVENOUS

## 2021-06-22 MED ORDER — METHOCARBAMOL 500 MG IVPB - SIMPLE MED
500.0000 mg | Freq: Four times a day (QID) | INTRAVENOUS | Status: DC | PRN
  Filled 2021-06-22: qty 50

## 2021-06-22 MED ORDER — ALPRAZOLAM 0.5 MG PO TABS
0.5000 mg | ORAL_TABLET | Freq: Every evening | ORAL | Status: DC | PRN
  Administered 2021-06-22: 0.5 mg via ORAL
  Administered 2021-06-23 – 2021-06-24 (×2): 1 mg via ORAL
  Filled 2021-06-22 (×2): qty 2
  Filled 2021-06-22: qty 1

## 2021-06-22 MED ORDER — ONDANSETRON HCL 4 MG/2ML IJ SOLN: 4.0000 mg | Freq: Four times a day (QID) | INTRAMUSCULAR | Status: DC | PRN

## 2021-06-22 MED ORDER — PROPOFOL 10 MG/ML IV BOLUS
INTRAVENOUS | Status: AC
  Filled 2021-06-22: qty 20

## 2021-06-22 MED ORDER — PHENYLEPHRINE 40 MCG/ML (10ML) SYRINGE FOR IV PUSH (FOR BLOOD PRESSURE SUPPORT)
PREFILLED_SYRINGE | INTRAVENOUS | Status: DC | PRN
  Administered 2021-06-22: 160 ug via INTRAVENOUS

## 2021-06-22 MED ORDER — ONDANSETRON HCL 4 MG PO TABS
4.0000 mg | ORAL_TABLET | Freq: Four times a day (QID) | ORAL | Status: DC | PRN
  Administered 2021-06-25: 13:00:00 4 mg via ORAL
  Filled 2021-06-22: qty 1

## 2021-06-22 MED ORDER — ONDANSETRON HCL 4 MG/2ML IJ SOLN
INTRAMUSCULAR | Status: AC
  Filled 2021-06-22: qty 2

## 2021-06-22 MED ORDER — ONDANSETRON HCL 4 MG/2ML IJ SOLN
INTRAMUSCULAR | Status: DC | PRN
  Administered 2021-06-22: 4 mg via INTRAVENOUS

## 2021-06-22 MED ORDER — CEFAZOLIN SODIUM-DEXTROSE 1-4 GM/50ML-% IV SOLN
1.0000 g | Freq: Four times a day (QID) | INTRAVENOUS | Status: AC
  Administered 2021-06-22 (×2): 1 g via INTRAVENOUS
  Filled 2021-06-22 (×2): qty 50

## 2021-06-22 MED ORDER — FENTANYL CITRATE PF 50 MCG/ML IJ SOSY
25.0000 ug | PREFILLED_SYRINGE | INTRAMUSCULAR | Status: DC | PRN
  Administered 2021-06-22: 25 ug via INTRAVENOUS
  Administered 2021-06-22 (×2): 50 ug via INTRAVENOUS

## 2021-06-22 MED ORDER — ORAL CARE MOUTH RINSE: 15.0000 mL | Freq: Once | OROMUCOSAL | Status: AC

## 2021-06-22 MED ORDER — VITAMIN B-12 1000 MCG PO TABS
1000.0000 ug | ORAL_TABLET | Freq: Every day | ORAL | Status: DC
  Administered 2021-06-22 – 2021-06-24 (×3): 1000 ug via ORAL
  Filled 2021-06-22 (×3): qty 1

## 2021-06-22 MED ORDER — FENTANYL CITRATE (PF) 100 MCG/2ML IJ SOLN
INTRAMUSCULAR | Status: DC | PRN
  Administered 2021-06-22 (×2): 25 ug via INTRAVENOUS

## 2021-06-22 MED ORDER — PROPOFOL 1000 MG/100ML IV EMUL
INTRAVENOUS | Status: AC
  Filled 2021-06-22: qty 100

## 2021-06-22 MED ORDER — POVIDONE-IODINE 10 % EX SWAB
2.0000 "application " | Freq: Once | CUTANEOUS | Status: AC
  Administered 2021-06-22: 2 via TOPICAL

## 2021-06-22 MED ORDER — PHENOL 1.4 % MT LIQD: 1.0000 | OROMUCOSAL | Status: DC | PRN

## 2021-06-22 MED ORDER — CHLORHEXIDINE GLUCONATE 0.12 % MT SOLN
15.0000 mL | Freq: Once | OROMUCOSAL | Status: AC
  Administered 2021-06-22: 15 mL via OROMUCOSAL

## 2021-06-22 MED ORDER — VITAMIN D 25 MCG (1000 UNIT) PO TABS
1000.0000 [IU] | ORAL_TABLET | Freq: Every day | ORAL | Status: DC
  Administered 2021-06-23 – 2021-06-25 (×3): 1000 [IU] via ORAL
  Filled 2021-06-22 (×3): qty 1

## 2021-06-22 MED ORDER — PANTOPRAZOLE SODIUM 40 MG PO TBEC
40.0000 mg | DELAYED_RELEASE_TABLET | Freq: Every day | ORAL | Status: DC
  Administered 2021-06-23 – 2021-06-25 (×3): 40 mg via ORAL
  Filled 2021-06-22 (×3): qty 1

## 2021-06-22 MED ORDER — HYDROCODONE-ACETAMINOPHEN 5-325 MG PO TABS
1.0000 | ORAL_TABLET | ORAL | Status: DC | PRN
  Administered 2021-06-22 – 2021-06-23 (×4): 2 via ORAL
  Administered 2021-06-25: 13:00:00 1 via ORAL
  Administered 2021-06-25: 06:00:00 2 via ORAL
  Filled 2021-06-22: qty 1
  Filled 2021-06-22 (×5): qty 2

## 2021-06-22 MED ORDER — GLIPIZIDE 10 MG PO TABS
10.0000 mg | ORAL_TABLET | Freq: Every day | ORAL | Status: DC
  Administered 2021-06-23 – 2021-06-25 (×3): 10 mg via ORAL
  Filled 2021-06-22 (×3): qty 1

## 2021-06-22 MED ORDER — SODIUM CHLORIDE 0.9 % IR SOLN
Status: DC | PRN
  Administered 2021-06-22: 1000 mL

## 2021-06-22 MED ORDER — MENTHOL 3 MG MT LOZG: 1.0000 | LOZENGE | OROMUCOSAL | Status: DC | PRN

## 2021-06-22 MED ORDER — FENTANYL CITRATE PF 50 MCG/ML IJ SOSY
PREFILLED_SYRINGE | INTRAMUSCULAR | Status: AC
  Administered 2021-06-22: 25 ug via INTRAVENOUS
  Filled 2021-06-22: qty 3

## 2021-06-22 MED ORDER — LACTATED RINGERS IV SOLN: INTRAVENOUS | Status: DC

## 2021-06-22 MED ORDER — ACETAMINOPHEN 500 MG PO TABS
1000.0000 mg | ORAL_TABLET | Freq: Once | ORAL | Status: AC
  Administered 2021-06-22: 1000 mg via ORAL
  Filled 2021-06-22: qty 2

## 2021-06-22 MED ORDER — PROPOFOL 10 MG/ML IV BOLUS
INTRAVENOUS | Status: DC | PRN
  Administered 2021-06-22 (×5): 10 mg via INTRAVENOUS

## 2021-06-22 MED ORDER — METOCLOPRAMIDE HCL 5 MG/ML IJ SOLN: 5.0000 mg | Freq: Three times a day (TID) | INTRAMUSCULAR | Status: DC | PRN

## 2021-06-22 MED ORDER — DIPHENHYDRAMINE HCL 12.5 MG/5ML PO ELIX: 12.5000 mg | ORAL_SOLUTION | ORAL | Status: DC | PRN

## 2021-06-22 MED ORDER — 0.9 % SODIUM CHLORIDE (POUR BTL) OPTIME
TOPICAL | Status: DC | PRN
  Administered 2021-06-22: 1000 mL

## 2021-06-22 MED ORDER — MORPHINE SULFATE (PF) 2 MG/ML IV SOLN
0.5000 mg | INTRAVENOUS | Status: DC | PRN
  Administered 2021-06-22 – 2021-06-23 (×3): 1 mg via INTRAVENOUS
  Filled 2021-06-22 (×3): qty 1

## 2021-06-22 MED ORDER — CEFAZOLIN SODIUM-DEXTROSE 2-4 GM/100ML-% IV SOLN
2.0000 g | INTRAVENOUS | Status: AC
  Administered 2021-06-22: 2 g via INTRAVENOUS
  Filled 2021-06-22: qty 100

## 2021-06-22 MED ORDER — TROSPIUM CHLORIDE ER 60 MG PO CP24: 60.0000 mg | ORAL_CAPSULE | Freq: Every day | ORAL | Status: DC

## 2021-06-22 MED ORDER — METOCLOPRAMIDE HCL 5 MG PO TABS: 5.0000 mg | ORAL_TABLET | Freq: Three times a day (TID) | ORAL | Status: DC | PRN

## 2021-06-22 SURGICAL SUPPLY — 49 items
APL SKNCLS STERI-STRIP NONHPOA (GAUZE/BANDAGES/DRESSINGS)
BAG COUNTER SPONGE SURGICOUNT (BAG) ×2 IMPLANT
BAG SPEC THK2 15X12 ZIP CLS (MISCELLANEOUS) ×1
BAG SPNG CNTER NS LX DISP (BAG) ×1
BAG SURGICOUNT SPONGE COUNTING (BAG) ×1
BAG ZIPLOCK 12X15 (MISCELLANEOUS) ×2 IMPLANT
BENZOIN TINCTURE PRP APPL 2/3 (GAUZE/BANDAGES/DRESSINGS) IMPLANT
BLADE SAW SGTL 18X1.27X75 (BLADE) ×2 IMPLANT
BLADE SAW SGTL 18X1.27X75MM (BLADE) ×1
CLOSURE WOUND 1/2 X4 (GAUZE/BANDAGES/DRESSINGS)
COVER PERINEAL POST (MISCELLANEOUS) ×3 IMPLANT
COVER SURGICAL LIGHT HANDLE (MISCELLANEOUS) ×3 IMPLANT
CUP SECTOR GRIPTON 50MM (Cup) ×2 IMPLANT
DRAPE FOOT SWITCH (DRAPES) ×3 IMPLANT
DRAPE STERI IOBAN 125X83 (DRAPES) ×3 IMPLANT
DRAPE U-SHAPE 47X51 STRL (DRAPES) ×8 IMPLANT
DRSG AQUACEL AG ADV 3.5X10 (GAUZE/BANDAGES/DRESSINGS) ×3 IMPLANT
DURAPREP 26ML APPLICATOR (WOUND CARE) ×3 IMPLANT
ELECT BLADE INSULATED 4IN (ELECTROSURGICAL) ×3
ELECT REM PT RETURN 15FT ADLT (MISCELLANEOUS) ×3 IMPLANT
ELECTRODE BLADE INSULATED 4IN (ELECTROSURGICAL) IMPLANT
GAUZE XEROFORM 1X8 LF (GAUZE/BANDAGES/DRESSINGS) ×3 IMPLANT
GLOVE SRG 8 PF TXTR STRL LF DI (GLOVE) ×2 IMPLANT
GLOVE SURG ENC MOIS LTX SZ7.5 (GLOVE) ×3 IMPLANT
GLOVE SURG NEOPR MICRO LF SZ8 (GLOVE) ×3 IMPLANT
GLOVE SURG UNDER POLY LF SZ8 (GLOVE) ×6
GOWN STRL REUS W/TWL XL LVL3 (GOWN DISPOSABLE) ×6 IMPLANT
HANDPIECE INTERPULSE COAX TIP (DISPOSABLE) ×3
HEAD FEM STD 32X+1 STRL (Hips) ×2 IMPLANT
HOLDER FOLEY CATH W/STRAP (MISCELLANEOUS) ×3 IMPLANT
KIT TURNOVER KIT A (KITS) IMPLANT
LINER ACET PNNCL PLUS4 NEUTRAL (Hips) IMPLANT
PACK ANTERIOR HIP CUSTOM (KITS) ×3 IMPLANT
PINNACLE PLUS 4 NEUTRAL (Hips) ×3 IMPLANT
SCREW 6.5MMX25MM (Screw) ×2 IMPLANT
SET HNDPC FAN SPRY TIP SCT (DISPOSABLE) ×1 IMPLANT
STAPLER VISISTAT 35W (STAPLE) ×2 IMPLANT
STEM CORAIL KA13 (Stem) ×2 IMPLANT
STRIP CLOSURE SKIN 1/2X4 (GAUZE/BANDAGES/DRESSINGS) IMPLANT
SUT ETHIBOND NAB CT1 #1 30IN (SUTURE) ×3 IMPLANT
SUT ETHILON 2 0 PS N (SUTURE) IMPLANT
SUT MNCRL AB 4-0 PS2 18 (SUTURE) IMPLANT
SUT VIC AB 0 CT1 36 (SUTURE) ×3 IMPLANT
SUT VIC AB 1 CT1 36 (SUTURE) ×3 IMPLANT
SUT VIC AB 2-0 CT1 27 (SUTURE) ×6
SUT VIC AB 2-0 CT1 TAPERPNT 27 (SUTURE) ×2 IMPLANT
TRAY FOLEY MTR SLVR 14FR STAT (SET/KITS/TRAYS/PACK) ×2 IMPLANT
TRAY FOLEY MTR SLVR 16FR STAT (SET/KITS/TRAYS/PACK) IMPLANT
YANKAUER SUCT BULB TIP NO VENT (SUCTIONS) ×5 IMPLANT

## 2021-06-22 NOTE — Anesthesia Procedure Notes (Signed)
Spinal  Patient location during procedure: OR Start time: 06/22/2021 10:34 AM End time: 06/22/2021 10:37 AM Reason for block: surgical anesthesia Staffing Performed: resident/CRNA  Anesthesiologist: Freddrick March, MD Resident/CRNA: Raenette Rover, CRNA Preanesthetic Checklist Completed: patient identified, IV checked, site marked, risks and benefits discussed, surgical consent, monitors and equipment checked, pre-op evaluation and timeout performed Spinal Block Patient position: sitting Prep: DuraPrep Patient monitoring: blood pressure, continuous pulse ox and heart rate Approach: midline Location: L3-4 Injection technique: single-shot Needle Needle type: Pencan  Needle gauge: 24 G Assessment Sensory level: T6 Events: CSF return

## 2021-06-22 NOTE — Evaluation (Signed)
Physical Therapy Evaluation Patient Details Name: [name] MRN: [id number] DOB: 10-May-1930 Today's Date: 06/22/2021  History of Present Illness  Pt s/p L THR and with hx of bil TKR, back surgery, RBBB, DDD  Clinical Impression  Pt s/p L THR and presents with decreased L LE strength/ROM, post op pain, balance deficits, and premorbid deconditioning limiting functional mobility.  Pt would benefit from follow up rehab at SNF level to maximize IND and safety prior to return home with limited assist.     Recommendations for follow up therapy are one component of a multi-disciplinary discharge planning process, led by the attending physician.  Recommendations may be updated based on patient status, additional functional criteria and insurance authorization.  Follow Up Recommendations Skilled nursing-short term rehab (<3 hours/day)    Assistance Recommended at Discharge Frequent or constant Supervision/Assistance  Functional Status Assessment Patient has had a recent decline in their functional status and demonstrates the ability to make significant improvements in function in a reasonable and predictable amount of time.  Equipment Recommendations  None recommended by PT    Recommendations for Other Services       Precautions / Restrictions Precautions Precautions: Fall Restrictions Weight Bearing Restrictions: No LLE Weight Bearing: Weight bearing as tolerated      Mobility  Bed Mobility Overal bed mobility: Needs Assistance Bed Mobility: Supine to Sit     Supine to sit: Mod assist     General bed mobility comments: Increased time with cues for sequence and use of R LE to self assist.  Pt utilizing rails and requiring assist to manage L LE, to bring trunk to upright and to complete rotation to EOB with bed pad    Transfers Overall transfer level: Needs assistance Equipment used: Rolling walker (2 wheels) Transfers: Sit to/from Stand;Bed to chair/wheelchair/BSC Sit to  Stand: Min assist;Mod assist   Step pivot transfers: Min assist;Mod assist       General transfer comment: cues for LE management and use of UEs to self assist; assist to bring wt up and fwd and to balance in standing    Ambulation/Gait         Gait velocity: decr     General Gait Details: step pvt bed to recliner only - ltd by c/o lightheadness and pain.  Stairs            Wheelchair Mobility    Modified Rankin (Stroke Patients Only)       Balance Overall balance assessment: Needs assistance Sitting-balance support: No upper extremity supported;Feet supported Sitting balance-Leahy Scale: Fair     Standing balance support: Bilateral upper extremity supported Standing balance-Leahy Scale: Poor                               Pertinent Vitals/Pain Pain Assessment: Faces Faces Pain Scale: Hurts even more Pain Location: L hip Pain Descriptors / Indicators: Aching;Grimacing;Sore Pain Intervention(s): Limited activity within patient's tolerance;Monitored during session;Premedicated before session    Home Living Family/patient expects to be discharged to:: Skilled nursing facility Living Arrangements: Alone                      Prior Function Prior Level of Function : Independent/Modified Independent             Mobility Comments: Moving very slowly per dtr and on RW since back surgery last year       Hand Dominance   Dominant  Hand: Right    Extremity/Trunk Assessment   Upper Extremity Assessment Upper Extremity Assessment: Generalized weakness    Lower Extremity Assessment Lower Extremity Assessment: LLE deficits/detail;Generalized weakness    Cervical / Trunk Assessment Cervical / Trunk Assessment: Kyphotic  Communication   Communication: HOH  Cognition Arousal/Alertness: Awake/alert Behavior During Therapy: WFL for tasks assessed/performed Overall Cognitive Status: Within Functional Limits for tasks assessed                                           General Comments      Exercises Total Joint Exercises Ankle Circles/Pumps: AROM;Both;15 reps;Supine   Assessment/Plan    PT Assessment Patient needs continued PT services  PT Problem List Decreased strength;Decreased range of motion;Decreased activity tolerance;Decreased balance;Decreased mobility;Decreased knowledge of use of DME;Pain       PT Treatment Interventions DME instruction;Gait training;Functional mobility training;Therapeutic activities;Therapeutic exercise;Balance training;Patient/family education    PT Goals (Current goals can be found in the Care Plan section)  Acute Rehab PT Goals Patient Stated Goal: REgain IND PT Goal Formulation: With patient Time For Goal Achievement: 06/29/21 Potential to Achieve Goals: Good    Frequency 7X/week   Barriers to discharge        Co-evaluation               AM-PAC PT "6 Clicks" Mobility  Outcome Measure Help needed turning from your back to your side while in a flat bed without using bedrails?: A Lot Help needed moving from lying on your back to sitting on the side of a flat bed without using bedrails?: A Lot Help needed moving to and from a bed to a chair (including a wheelchair)?: A Lot Help needed standing up from a chair using your arms (e.g., wheelchair or bedside chair)?: A Lot Help needed to walk in hospital room?: Total Help needed climbing 3-5 steps with a railing? : Total 6 Click Score: 10    End of Session Equipment Utilized During Treatment: Gait belt Activity Tolerance: Patient limited by fatigue;Patient limited by pain Patient left: in chair;with call bell/phone within reach;with chair alarm set;with nursing/sitter in room Nurse Communication: Mobility status PT Visit Diagnosis: Unsteadiness on feet (R26.81);Muscle weakness (generalized) (M62.81);Difficulty in walking, not elsewhere classified (R26.2);Pain Pain - Right/Left: Left Pain - part of  body: Hip    Time: 4496-7591 PT Time Calculation (min) (ACUTE ONLY): 32 min   Charges:   PT Evaluation $PT Eval Low Complexity: 1 Low PT Treatments $Therapeutic Exercise: 8-22 mins $Therapeutic Activity: 8-22 mins        Debe Coder PT Acute Rehabilitation Services Pager [phone] Office [phone]   Sadie Hazelett 06/22/2021, 5:53 PM

## 2021-06-22 NOTE — Interval H&P Note (Signed)
History and Physical Interval Note: The patient understands that she is here today for a left hip replacement to treat her left hip severe osteoarthritis.  There has been no acute or interval change in her medical status.  Please see recent H&P.  The risks and benefits of surgery been explained in detail and informed consent is obtained.  The left hip has been marked.  06/22/2021 8:39 AM  [name]  has presented today for surgery, with the diagnosis of osteoarthritis left hip.  The various methods of treatment have been discussed with the patient and family. After consideration of risks, benefits and other options for treatment, the patient has consented to  Procedure(s): LEFT TOTAL HIP ARTHROPLASTY ANTERIOR APPROACH (Left) as a surgical intervention.  The patient's history has been reviewed, patient examined, no change in status, stable for surgery.  I have reviewed the patient's chart and labs.  Questions were answered to the patient's satisfaction.     Mcarthur Rossetti

## 2021-06-22 NOTE — Transfer of Care (Signed)
Immediate Anesthesia Transfer of Care Note  Patient: [name]  Procedure(s) Performed: LEFT TOTAL HIP ARTHROPLASTY ANTERIOR APPROACH (Left: Hip)  Patient Location: PACU  Anesthesia Type:Spinal  Level of Consciousness: awake, alert , oriented and patient cooperative  Airway & Oxygen Therapy: Patient Spontanous Breathing and Patient connected to face mask oxygen  Post-op Assessment: Report given to RN and Post -op Vital signs reviewed and stable  Post vital signs: Reviewed and stable  Last Vitals:  Vitals Value Taken Time  BP 129/59 06/22/21 1208  Temp    Pulse 103 06/22/21 1211  Resp 26 06/22/21 1211  SpO2 100 % 06/22/21 1211  Vitals shown include unvalidated device data.  Last Pain:  Vitals:   06/22/21 0829  TempSrc:   PainSc: 8          Complications: No notable events documented.

## 2021-06-22 NOTE — Anesthesia Postprocedure Evaluation (Signed)
Anesthesia Post Note  Patient: [name]  Procedure(s) Performed: LEFT TOTAL HIP ARTHROPLASTY ANTERIOR APPROACH (Left: Hip)     Patient location during evaluation: PACU Anesthesia Type: Spinal Level of consciousness: oriented and awake and alert Pain management: pain level controlled Vital Signs Assessment: post-procedure vital signs reviewed and stable Respiratory status: spontaneous breathing, respiratory function stable and patient connected to nasal cannula oxygen Cardiovascular status: blood pressure returned to baseline and stable Postop Assessment: no headache, no backache and no apparent nausea or vomiting Anesthetic complications: no   No notable events documented.  Last Vitals:  Vitals:   06/22/21 1330 06/22/21 1340  BP: 100/61 (!) 103/53  Pulse: (!) 112 (!) 110  Resp: (!) 9 12  Temp: 36.4 C   SpO2: 100% 100%    Last Pain:  Vitals:   06/22/21 1330  TempSrc:   PainSc: 4                  Dewan Emond L Corneshia Hines

## 2021-06-22 NOTE — Care Plan (Signed)
Ortho Bundle Case Management Note  Patient Details  Name: [name] MRN: [id number] Date of Birth: December 20, 1929                  RNCM call to patient's daughter, who helps with care for her mother, to discuss her upcoming Left total hip arthroplasty with Dr. Ninfa Linden. She will be an Ortho bundle patient through [facility] and is agreeable to case management. CM and daughter, [name], had discussion about discharge plan. Daughter feels that mother would be more comfortable at [name] for STR after hospitalization. Will assess how she does with PT at hospital after surgery to determine discharge planning needs. She has all DME needed at home already. Daughter requested [facility] if SNF is needed.Referral to HHPT with [facility] also made after choice provided in case patient does goes home with assistance from family. Will continue to follow for needs.    DME Arranged:   (Patient's family reports she has a RW and BSC/3in1, shower chair and hospital bed at home) DME Agency:     [place]:  PT [place]:  [place]  Additional Comments: Please contact me with any questions of if this plan should need to change.  Jamse Arn, RN, BSN, [employer]  [phone] 06/22/2021, 1:49 PM

## 2021-06-22 NOTE — Brief Op Note (Signed)
06/22/2021  11:48 AM  PATIENT:  [name]  87 y.o. female  PRE-OPERATIVE DIAGNOSIS:  osteoarthritis left hip  POST-OPERATIVE DIAGNOSIS:  osteoarthritis left hip  PROCEDURE:  Procedure(s): LEFT TOTAL HIP ARTHROPLASTY ANTERIOR APPROACH (Left)  SURGEON:  Surgeon(s) and Role:    Mcarthur Rossetti, MD - Primary  PHYSICIAN ASSISTANT:  Benita Stabile, PA-C  ANESTHESIA:   spinal  EBL:  150 mL   COUNTS:  YES  DICTATION: .Other Dictation: Dictation Number [id number]  PLAN OF CARE: Admit for overnight observation  PATIENT DISPOSITION:  PACU - hemodynamically stable.   Delay start of Pharmacological VTE agent (>24hrs) due to surgical blood loss or risk of bleeding: no

## 2021-06-23 DIAGNOSIS — M8580 Other specified disorders of bone density and structure, unspecified site: Secondary | ICD-10-CM | POA: Diagnosis present

## 2021-06-23 DIAGNOSIS — Z471 Aftercare following joint replacement surgery: Secondary | ICD-10-CM | POA: Diagnosis not present

## 2021-06-23 DIAGNOSIS — M247 Protrusio acetabuli: Secondary | ICD-10-CM | POA: Diagnosis present

## 2021-06-23 DIAGNOSIS — K5903 Drug induced constipation: Secondary | ICD-10-CM | POA: Diagnosis not present

## 2021-06-23 DIAGNOSIS — E559 Vitamin D deficiency, unspecified: Secondary | ICD-10-CM | POA: Diagnosis not present

## 2021-06-23 DIAGNOSIS — Z96653 Presence of artificial knee joint, bilateral: Secondary | ICD-10-CM | POA: Diagnosis present

## 2021-06-23 DIAGNOSIS — M48062 Spinal stenosis, lumbar region with neurogenic claudication: Secondary | ICD-10-CM | POA: Diagnosis not present

## 2021-06-23 DIAGNOSIS — Z800 Family history of malignant neoplasm of digestive organs: Secondary | ICD-10-CM | POA: Diagnosis not present

## 2021-06-23 DIAGNOSIS — K219 Gastro-esophageal reflux disease without esophagitis: Secondary | ICD-10-CM | POA: Diagnosis present

## 2021-06-23 DIAGNOSIS — M519 Unspecified thoracic, thoracolumbar and lumbosacral intervertebral disc disorder: Secondary | ICD-10-CM | POA: Diagnosis not present

## 2021-06-23 DIAGNOSIS — G894 Chronic pain syndrome: Secondary | ICD-10-CM | POA: Diagnosis present

## 2021-06-23 DIAGNOSIS — F419 Anxiety disorder, unspecified: Secondary | ICD-10-CM | POA: Diagnosis not present

## 2021-06-23 DIAGNOSIS — Z7982 Long term (current) use of aspirin: Secondary | ICD-10-CM | POA: Diagnosis not present

## 2021-06-23 DIAGNOSIS — E039 Hypothyroidism, unspecified: Secondary | ICD-10-CM | POA: Diagnosis present

## 2021-06-23 DIAGNOSIS — N319 Neuromuscular dysfunction of bladder, unspecified: Secondary | ICD-10-CM | POA: Diagnosis not present

## 2021-06-23 DIAGNOSIS — R339 Retention of urine, unspecified: Secondary | ICD-10-CM | POA: Diagnosis not present

## 2021-06-23 DIAGNOSIS — Z8042 Family history of malignant neoplasm of prostate: Secondary | ICD-10-CM | POA: Diagnosis not present

## 2021-06-23 DIAGNOSIS — Z853 Personal history of malignant neoplasm of breast: Secondary | ICD-10-CM | POA: Diagnosis not present

## 2021-06-23 DIAGNOSIS — Z7401 Bed confinement status: Secondary | ICD-10-CM | POA: Diagnosis not present

## 2021-06-23 DIAGNOSIS — Z8619 Personal history of other infectious and parasitic diseases: Secondary | ICD-10-CM | POA: Diagnosis not present

## 2021-06-23 DIAGNOSIS — Z803 Family history of malignant neoplasm of breast: Secondary | ICD-10-CM | POA: Diagnosis not present

## 2021-06-23 DIAGNOSIS — R531 Weakness: Secondary | ICD-10-CM | POA: Diagnosis not present

## 2021-06-23 DIAGNOSIS — Z923 Personal history of irradiation: Secondary | ICD-10-CM | POA: Diagnosis not present

## 2021-06-23 DIAGNOSIS — Z8719 Personal history of other diseases of the digestive system: Secondary | ICD-10-CM | POA: Diagnosis not present

## 2021-06-23 DIAGNOSIS — D649 Anemia, unspecified: Secondary | ICD-10-CM | POA: Diagnosis not present

## 2021-06-23 DIAGNOSIS — E785 Hyperlipidemia, unspecified: Secondary | ICD-10-CM | POA: Diagnosis present

## 2021-06-23 DIAGNOSIS — Z7984 Long term (current) use of oral hypoglycemic drugs: Secondary | ICD-10-CM | POA: Diagnosis not present

## 2021-06-23 DIAGNOSIS — N1832 Chronic kidney disease, stage 3b: Secondary | ICD-10-CM | POA: Diagnosis present

## 2021-06-23 DIAGNOSIS — E1122 Type 2 diabetes mellitus with diabetic chronic kidney disease: Secondary | ICD-10-CM | POA: Diagnosis present

## 2021-06-23 DIAGNOSIS — Z466 Encounter for fitting and adjustment of urinary device: Secondary | ICD-10-CM | POA: Diagnosis not present

## 2021-06-23 DIAGNOSIS — R54 Age-related physical debility: Secondary | ICD-10-CM | POA: Diagnosis present

## 2021-06-23 DIAGNOSIS — Z96642 Presence of left artificial hip joint: Secondary | ICD-10-CM | POA: Diagnosis not present

## 2021-06-23 DIAGNOSIS — D62 Acute posthemorrhagic anemia: Secondary | ICD-10-CM | POA: Diagnosis not present

## 2021-06-23 DIAGNOSIS — Z20822 Contact with and (suspected) exposure to covid-19: Secondary | ICD-10-CM | POA: Diagnosis present

## 2021-06-23 DIAGNOSIS — Z87440 Personal history of urinary (tract) infections: Secondary | ICD-10-CM | POA: Diagnosis not present

## 2021-06-23 DIAGNOSIS — I129 Hypertensive chronic kidney disease with stage 1 through stage 4 chronic kidney disease, or unspecified chronic kidney disease: Secondary | ICD-10-CM | POA: Diagnosis present

## 2021-06-23 DIAGNOSIS — G8918 Other acute postprocedural pain: Secondary | ICD-10-CM | POA: Diagnosis not present

## 2021-06-23 DIAGNOSIS — M1612 Unilateral primary osteoarthritis, left hip: Secondary | ICD-10-CM | POA: Diagnosis present

## 2021-06-23 LAB — CBC
HCT: 23.2 % — ABNORMAL LOW (ref 36.0–46.0)
Hemoglobin: 7.8 g/dL — ABNORMAL LOW (ref 12.0–15.0)
MCH: 29.4 pg (ref 26.0–34.0)
MCHC: 33.6 g/dL (ref 30.0–36.0)
MCV: 87.5 fL (ref 80.0–100.0)
Platelets: 211 10*3/uL (ref 150–400)
RBC: 2.65 MIL/uL — ABNORMAL LOW (ref 3.87–5.11)
RDW: 14.1 % (ref 11.5–15.5)
WBC: 6.8 10*3/uL (ref 4.0–10.5)
nRBC: 0 % (ref 0.0–0.2)

## 2021-06-23 LAB — BASIC METABOLIC PANEL
Anion gap: 7 (ref 5–15)
BUN: 31 mg/dL — ABNORMAL HIGH (ref 8–23)
CO2: 27 mmol/L (ref 22–32)
Calcium: 8.4 mg/dL — ABNORMAL LOW (ref 8.9–10.3)
Chloride: 96 mmol/L — ABNORMAL LOW (ref 98–111)
Creatinine, Ser: 1.43 mg/dL — ABNORMAL HIGH (ref 0.44–1.00)
GFR, Estimated: 35 mL/min — ABNORMAL LOW (ref >60)
Glucose, Bld: 160 mg/dL — ABNORMAL HIGH (ref 70–99)
Potassium: 4.3 mmol/L (ref 3.5–5.1)
Sodium: 130 mmol/L — ABNORMAL LOW (ref 135–145)

## 2021-06-23 LAB — TYPE AND SCREEN
ABO/RH(D): O POS
Antibody Screen: NEGATIVE

## 2021-06-23 LAB — PREPARE RBC (CROSSMATCH)

## 2021-06-23 MED ORDER — SODIUM CHLORIDE 0.9% IV SOLUTION: Freq: Once | INTRAVENOUS | Status: AC

## 2021-06-23 NOTE — Plan of Care (Signed)
  Problem: Pain Management: Goal: Pain level will decrease with appropriate interventions Outcome: Progressing   

## 2021-06-23 NOTE — [place] FL2 (Signed)
[place] MEDICAID FL2 LEVEL OF CARE SCREENING TOOL     IDENTIFICATION  Patient Name: [name] Birthdate: 1930/05/30 Sex: female Admission Date (Current Location): 06/22/2021  [facility] and [place] Number:  Herbalist and Address:  [facility],  [place], [place]      Provider Number: [id number]  Attending Physician Name and Address:  Mcarthur Rossetti,*  Relative Name and Phone Number:  Festus Barren Daughter [phone]  [phone]    Current Level of Care: Hospital Recommended Level of Care: [place] Prior Approval Number:    Date Approved/Denied:   PASRR Number: [id number]  Discharge Plan: SNF    Current Diagnoses: Patient Active Problem List   Diagnosis Date Noted   Status post total replacement of left hip 06/22/2021   Unilateral primary osteoarthritis, left hip 04/23/2021   Peripheral edema 07/11/2020   Sleep disturbance 06/12/2020   Drug induced constipation    Neurogenic bladder    Constipation    Hypoalbuminemia due to protein-calorie malnutrition (HCC)    Benign essential HTN    Stage 3b chronic kidney disease ([place])    Chronic pain syndrome    Controlled type 2 diabetes mellitus with hyperglycemia, without long-term current use of insulin (HCC)    Lumbar radiculopathy 05/02/2020   Spondylolisthesis of lumbar region 04/24/2020   Swelling of left lower extremity 02/18/2020   Urinary retention with incomplete bladder emptying    Hip pain    Severe back pain    AKI (acute kidney injury) ([place])    Intractable pain/ left hip osteoarthritis 12/25/2019   Spinal stenosis, lumbar region, with neurogenic claudication 03/06/2016   Malignant neoplasm of right female breast ([place]) 01/11/2016   Controlled type 2 diabetes mellitus without complication, without long-term current use of insulin ([place]) 01/11/2016   Genetic testing 08/18/2015   Family history of breast cancer    Family history of  prostate cancer    Family history of pancreatic cancer    Breast cancer of upper-outer quadrant of right female breast ([place]) 07/21/2015   Insomnia 05/06/2015   Medication management 12/16/2013   Hand arthritis 12/16/2013   Rectal bleeding 12/16/2013   Cold intolerance 06/17/2013   Renal insufficiency 12/23/2012   Post-menopause 12/23/2012   Hyperlipidemia 12/23/2012   Postmenopausal HRT (hormone replacement therapy) 12/23/2012   History of shingles 12/23/2012   Wears hearing aid 12/23/2012   Nonspecific abnormal unspecified cardiovascular function study 02/28/2012   Chronic sore throat 01/13/2012   Hoarse 01/13/2012   Chest pressure 12/03/2011   Itching  mid back 12/03/2011   Abnormal EKG 12/03/2011   Medicare annual wellness visit, subsequent 12/03/2011   DJD (degenerative joint disease) 12/03/2011   DIABETES MELLITUS, TYPE II, CONTROLLED 11/15/2009   VITAMIN D DEFICIENCY 11/15/2009   EXOGENOUS OBESITY 11/15/2009   ANXIETY DEPRESSION 12/22/2008   HYPERTHYROIDISM 08/23/2008   TOTAL KNEE REPLACEMENT, LEFT, HX OF 08/23/2008   Hypothyroidism 10/08/2007   ANEMIA 10/08/2007   Osteoarthritis 10/08/2007   OSTEOPENIA 10/08/2007   Dyslipidemia 11/07/2006   GERD (gastroesophageal reflux disease) 11/07/2006    Orientation RESPIRATION BLADDER Height & Weight     Self, Time, Situation, Place  Normal Continent Weight: 138 lb 14.2 oz (63 kg) Height:  5' 2.99" (160 cm)  BEHAVIORAL SYMPTOMS/MOOD NEUROLOGICAL BOWEL NUTRITION STATUS      Continent Diet (Carb Modified)  AMBULATORY STATUS COMMUNICATION OF NEEDS Skin   Limited Assist Verbally Surgical wounds  Personal Care Assistance Level of Assistance  Bathing, Feeding, Dressing Bathing Assistance: Limited assistance Feeding assistance: Limited assistance Dressing Assistance: Limited assistance     Functional Limitations Info  Sight, Speech, Hearing Sight Info: Adequate Hearing Info: Adequate Speech  Info: Adequate    SPECIAL CARE FACTORS FREQUENCY  PT (By licensed PT), OT (By licensed OT)     PT Frequency: Minimum 5x a week OT Frequency: Minimum 5x a week            Contractures Contractures Info: Not present    Additional Factors Info  Code Status, Allergies Code Status Info: Full Code Allergies Info: Phenergan (Promethazine)   Remeron (Mirtazapine)           Current Medications (06/23/2021):  This is the current hospital active medication list Current Facility-Administered Medications  Medication Dose Route Frequency Provider Last Rate Last Admin   0.9 %  sodium chloride infusion (Manually program via Guardrails IV Fluids)   Intravenous Once Mcarthur Rossetti, MD       0.9 %  sodium chloride infusion   Intravenous Continuous Mcarthur Rossetti, MD 75 mL/hr at 06/22/21 1544 New Bag at 06/22/21 1544   acetaminophen (TYLENOL) tablet 325-650 mg  325-650 mg Oral Q6H PRN Mcarthur Rossetti, MD       ALPRAZolam Duanne Moron) tablet 0.5-1 mg  0.5-1 mg Oral QHS PRN Mcarthur Rossetti, MD   0.5 mg at 06/22/21 2226   alum & mag hydroxide-simeth (MAALOX/MYLANTA) 200-200-20 MG/5ML suspension 30 mL  30 mL Oral Q4H PRN Mcarthur Rossetti, MD       aspirin chewable tablet 81 mg  81 mg Oral BID Mcarthur Rossetti, MD   81 mg at 06/23/21 2947   atorvastatin (LIPITOR) tablet 10 mg  10 mg Oral Daily Mcarthur Rossetti, MD   10 mg at 06/23/21 6546   cholecalciferol (VITAMIN D3) tablet 1,000 Units  1,000 Units Oral Daily Mcarthur Rossetti, MD   1,000 Units at 06/23/21 0834   diphenhydrAMINE (BENADRYL) 12.5 MG/5ML elixir 12.5-25 mg  12.5-25 mg Oral Q4H PRN Mcarthur Rossetti, MD       docusate sodium (COLACE) capsule 100 mg  100 mg Oral BID Mcarthur Rossetti, MD   100 mg at 06/23/21 5035   furosemide (LASIX) tablet 40 mg  40 mg Oral Daily Mcarthur Rossetti, MD   40 mg at 06/23/21 0834   glipiZIDE (GLUCOTROL) tablet 10 mg  10 mg Oral Q  breakfast Mcarthur Rossetti, MD   10 mg at 06/23/21 0724   HYDROcodone-acetaminophen (NORCO) 7.5-325 MG per tablet 1-2 tablet  1-2 tablet Oral Q4H PRN Mcarthur Rossetti, MD       HYDROcodone-acetaminophen (NORCO/VICODIN) 5-325 MG per tablet 1-2 tablet  1-2 tablet Oral Q4H PRN Mcarthur Rossetti, MD   2 tablet at 06/23/21 4656   menthol-cetylpyridinium (CEPACOL) lozenge 3 mg  1 lozenge Oral PRN Mcarthur Rossetti, MD       Or   phenol (CHLORASEPTIC) mouth spray 1 spray  1 spray Mouth/Throat PRN Mcarthur Rossetti, MD       methocarbamol (ROBAXIN) tablet 500 mg  500 mg Oral Q6H PRN Mcarthur Rossetti, MD   500 mg at 06/23/21 0242   Or   methocarbamol (ROBAXIN) 500 mg in dextrose 5 % 50 mL IVPB  500 mg Intravenous Q6H PRN Mcarthur Rossetti, MD 100 mL/hr at 06/22/21 1240 500 mg at 06/22/21 1240   metoCLOPramide (REGLAN) tablet 5-10 mg  5-10 mg Oral Q8H PRN Mcarthur Rossetti, MD       Or   metoCLOPramide (REGLAN) injection 5-10 mg  5-10 mg Intravenous Q8H PRN Mcarthur Rossetti, MD       morphine 2 MG/ML injection 0.5-1 mg  0.5-1 mg Intravenous Q2H PRN Mcarthur Rossetti, MD   1 mg at 06/23/21 0334   ondansetron (ZOFRAN) tablet 4 mg  4 mg Oral Q6H PRN Mcarthur Rossetti, MD       Or   ondansetron [facility]) injection 4 mg  4 mg Intravenous Q6H PRN Mcarthur Rossetti, MD       pantoprazole (PROTONIX) EC tablet 40 mg  40 mg Oral Daily Mcarthur Rossetti, MD   40 mg at 06/23/21 2774   Trospium Chloride CP24 60 mg  60 mg Oral Daily Mcarthur Rossetti, MD       vitamin B-12 (CYANOCOBALAMIN) tablet 1,000 mcg  1,000 mcg Oral QHS Mcarthur Rossetti, MD   1,000 mcg at 06/22/21 2226     Discharge Medications: Please see discharge summary for a list of discharge medications.  Relevant Imaging Results:  Relevant Lab Results:   Additional Information SSN [id number]  Ross Ludwig, LCSW

## 2021-06-23 NOTE — Op Note (Signed)
[name], SARDO MEDICAL RECORD NO: [id number] ACCOUNT NO: [id number] DATE OF BIRTH: [birth year] FACILITY: [facility]: [facility] PHYSICIAN: Lind Guest. 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  PROCEDURE:  Left total hip arthroplasty through direct anterior approach.  IMPLANTS:  DePuy Sector Gription acetabular component size 50, size 32+4 neutral polyethylene liner, and a single screw in the acetabulum, Corail femoral component with standard offset size 13, size 32+1 metal hip ball.  SURGEON:  Lind Guest. Ninfa Linden, MD  ASSISTANT: Erskine Emery, PA-C.  ANESTHESIA:  Spinal.  ANTIBIOTICS:  2 g IV Ancef.  ESTIMATED BLOOD LOSS:  673 mL  COMPLICATIONS:  None.  INDICATIONS:  The patient is an 87 year old female with debilitating arthritis involving her left hip with significant protrusio on x-rays.  She has shortening on that side as well.  She does ambulate with a walker and has significant balance and pain  issues.  Her pain is debilitating to the point that she and her family wished for [place] to proceed with a total hip arthroplasty.  She has had extensive spine surgery and has a chronic indwelling Foley catheter as well.  We had a long and thorough  discussion about the risk of fracture given her frail bone as well as acute blood loss anemia, nerve or vessel injury, dislocation, DVT, implant failure, leg length differences and skin and soft tissue issues.  We talked about our goals being decreased  pain, improve mobility and overall improve quality of life.  DESCRIPTION OF PROCEDURE:  After informed consent was obtained, appropriate left hip was marked.  She was brought to the operating room and sat up on the stretcher where spinal  anesthesia was obtained.  She was laid in supine position on the stretcher  and traction boots were placed on both her feet.  She is significantly short on her left operative side.  She was then placed supine on the Hana fracture table, the perineal post in place and both legs in line skeletal traction device and no traction  applied.  Her left operative hip was prepped and draped with DuraPrep and sterile drapes.  A timeout was called.  She was identified as correct patient, correct left hip.  I then made an incision just inferior and posterior to the anterior superior iliac  spine and carried this obliquely down the leg.  We dissected down tensor fascia lata muscle.  Tensor fascia was then divided longitudinally to proceed with direct anterior approach to the hip.  We identified and cauterized circumflex vessels and  identified the hip capsule, opened up the hip capsule.  We had to remove significant amounts of the capsule because the hip was in such a protrusio position. We made a femoral neck cut with an oscillating saw just proximal to the lesser trochanter and  completed this with an osteotome.  We placed a corkscrew guide in the femoral head and was able to remove the femoral head in its entirety and it was completely devoid of cartilage.  We then placed a bent Hohmann over the medial acetabular rim and just  lightly touched the reamers needing to do a line to line reaming up to a size 50 and we started at 43.  We placed the last two reamers under direct fluoroscopy, so we could obtain our depth of  reaming, our inclination and anteversion.  I then placed the  real DePuy Sector Gription acetabular component size 50 and it was a tight fit, but we still put a single screw and we appreciated our screw placement under direct fluoroscopy.  We then placed a 32+4 polyethylene liner for that size acetabular component.   Attention was then turned to the femur.  With the leg externally rotated to 120 degrees,  extended and adducted, we were able to place a Mueller retractor medially and Hohman retractor behind the greater trochanter, we released lateral joint capsule and  used a box cutting osteotome to enter femoral canal and a rongeur to lateralize, then began broaching using the Corail broaching system from a size 8 going up to a size 13.  With the size 13 in place, we trialed a standard offset femoral neck and a 32+1  hip ball, we brought the leg back over and up, put traction, internal rotation, reducing the pelvis.  It was definitely very tight because we had increased her leg lengths, they were near equal.  We were pleased with positioning and stability, assessed  radiographically and mechanically.  We then dislocated the hip, removed the trial components.  We placed the real standard offset size 13 Corail femoral component and the real 32+1 metal hip ball and again reduced this in the acetabulum.  We appreciated  the stability assessed mechanically and radiographically.  We then irrigated the soft tissue with normal saline solution using pulse lavage.  We dried the soft tissue well.  There were no remnants or joint capsule to be closed, so we closed the tensor  fascia with #1 Vicryl followed by 0 Vicryl in the deep tissue and 2-0 Vicryl in subcutaneous tissues.  The skin was closed with staples.  A well-padded sterile dressing was applied.  She was taken off the Hana table and taken to recovery room in stable  condition with all final counts being correct.  No complications noted.  Of note, Benita Stabile, PA-C, did assist during the entirety of this case.  His assistance was crucial for facilitating every aspect of the case.   PAA D: 06/22/2021 11:46:46 am T: 06/23/2021 2:01:00 am  JOB: [id number]/ [id number]

## 2021-06-23 NOTE — Progress Notes (Signed)
Physical Therapy Treatment Patient Details Name: [name] MRN: [id number] DOB: [birth year] Today's Date: 06/23/2021   History of Present Illness Pt s/p L THR and with hx of bil TKR, back surgery, RBBB, DDD    PT Comments    Pt very cooperative and progressing with mobility but requiring increased time and significant assist for all tasks.  Pt with Hgb 7.8 this am but denies symptoms during session.  RN advises transfusion on order and waiting on type and cross match.  Recommendations for follow up therapy are one component of a multi-disciplinary discharge planning process, led by the attending physician.  Recommendations may be updated based on patient status, additional functional criteria and insurance authorization.  Follow Up Recommendations  Skilled nursing-short term rehab (<3 hours/day)     Assistance Recommended at Discharge Frequent or constant Supervision/Assistance  Equipment Recommendations  None recommended by PT    Recommendations for Other Services       Precautions / Restrictions Precautions Precautions: Fall Restrictions Weight Bearing Restrictions: No LLE Weight Bearing: Weight bearing as tolerated     Mobility  Bed Mobility Overal bed mobility: Needs Assistance Bed Mobility: Supine to Sit     Supine to sit: Mod assist     General bed mobility comments: Increased time with cues for sequence and use of R LE to self assist.  Pt utilizing rails and requiring assist to manage L LE, to bring trunk to upright and to complete rotation to EOB with bed pad    Transfers Overall transfer level: Needs assistance Equipment used: Rolling walker (2 wheels) Transfers: Sit to/from Stand Sit to Stand: Min assist;Mod assist           General transfer comment: cues for LE management and use of UEs to self assist; assist to bring wt up and fwd and to balance in standing    Ambulation/Gait Ambulation/Gait assistance: Min assist;Mod assist Gait Distance  (Feet): 10 Feet Assistive device: Rolling walker (2 wheels) Gait Pattern/deviations: Step-to pattern;Decreased step length - left;Shuffle;Trunk flexed Gait velocity: decr     General Gait Details: Increased time with cues for sequence, posture and position from RW.  Assist for balance/support, RW management and to advance L LE   Stairs             Wheelchair Mobility    Modified Rankin (Stroke Patients Only)       Balance Overall balance assessment: Needs assistance Sitting-balance support: No upper extremity supported;Feet supported Sitting balance-Leahy Scale: Fair     Standing balance support: Bilateral upper extremity supported Standing balance-Leahy Scale: Poor                              Cognition Arousal/Alertness: Awake/alert Behavior During Therapy: WFL for tasks assessed/performed Overall Cognitive Status: Within Functional Limits for tasks assessed                                          Exercises Total Joint Exercises Ankle Circles/Pumps: AROM;Both;15 reps;Supine Quad Sets: AROM;Both;10 reps;Supine Heel Slides: AAROM;Left;20 reps;Supine Hip ABduction/ADduction: AAROM;Left;15 reps;Supine    General Comments        Pertinent Vitals/Pain Pain Assessment: Faces Pain Score: 4  Pain Location: L hip Pain Descriptors / Indicators: Aching;Sore Pain Intervention(s): Limited activity within patient's tolerance;Monitored during session;Premedicated before session;Ice applied    Home Living  Prior Function            PT Goals (current goals can now be found in the care plan section) Acute Rehab PT Goals Patient Stated Goal: REgain IND PT Goal Formulation: With patient Time For Goal Achievement: 06/29/21 Potential to Achieve Goals: Good Progress towards PT goals: Progressing toward goals    Frequency    7X/week      PT Plan Current plan remains appropriate     Co-evaluation              AM-PAC PT "6 Clicks" Mobility   Outcome Measure  Help needed turning from your back to your side while in a flat bed without using bedrails?: A Lot Help needed moving from lying on your back to sitting on the side of a flat bed without using bedrails?: A Lot Help needed moving to and from a bed to a chair (including a wheelchair)?: A Lot Help needed standing up from a chair using your arms (e.g., wheelchair or bedside chair)?: A Lot Help needed to walk in hospital room?: Total Help needed climbing 3-5 steps with a railing? : Total 6 Click Score: 10    End of Session Equipment Utilized During Treatment: Gait belt Activity Tolerance: Patient limited by fatigue;Patient tolerated treatment well Patient left: in chair;with call bell/phone within reach;with chair alarm set Nurse Communication: Mobility status PT Visit Diagnosis: Unsteadiness on feet (R26.81);Muscle weakness (generalized) (M62.81);Difficulty in walking, not elsewhere classified (R26.2);Pain Pain - Right/Left: Left Pain - part of body: Hip     Time: 0900-0940 PT Time Calculation (min) (ACUTE ONLY): 40 min  Charges:  $Gait Training: 8-22 mins $Therapeutic Exercise: 8-22 mins $Therapeutic Activity: 8-22 mins                     Debe Coder PT Acute Rehabilitation Services Pager [phone] Office [phone]    Loretta Doutt 06/23/2021, 11:59 AM

## 2021-06-23 NOTE — Progress Notes (Signed)
Physical Therapy Treatment Patient Details Name: [name] MRN: [id number] DOB: [birth year] Today's Date: 06/23/2021   History of Present Illness Pt s/p L THR and with hx of bil TKR, back surgery, RBBB, DDD    PT Comments    Pt continues very cooperative but progressing slowly with mobility, requiring increased time and assist for all tasks and limited by pain/fatigue.  Pt would benefit from follow up rehab at SNF level to maximize IND and safety prior to return home with limited assist.   Recommendations for follow up therapy are one component of a multi-disciplinary discharge planning process, led by the attending physician.  Recommendations may be updated based on patient status, additional functional criteria and insurance authorization.  Follow Up Recommendations  Skilled nursing-short term rehab (<3 hours/day)     Assistance Recommended at Discharge Frequent or constant Supervision/Assistance  Equipment Recommendations  None recommended by PT    Recommendations for Other Services       Precautions / Restrictions Precautions Precautions: Fall Restrictions Weight Bearing Restrictions: No LLE Weight Bearing: Weight bearing as tolerated     Mobility  Bed Mobility Overal bed mobility: Needs Assistance Bed Mobility: Sit to Supine       Sit to supine: Mod assist;+2 for physical assistance   General bed mobility comments: Increased time with cues for sequence and use of R LE to self assist.    Transfers Overall transfer level: Needs assistance Equipment used: Rolling walker (2 wheels) Transfers: Sit to/from Stand Sit to Stand: Min assist;Mod assist           General transfer comment: cues for LE management and use of UEs to self assist; assist to bring wt up and fwd and to balance in standing    Ambulation/Gait Ambulation/Gait assistance: Min assist;Mod assist Gait Distance (Feet): 18 Feet Assistive device: Rolling walker (2 wheels) Gait  Pattern/deviations: Step-to pattern;Decreased step length - left;Shuffle;Trunk flexed Gait velocity: decr     General Gait Details: Increased time with cues for sequence, posture and position from RW.  Assist for balance/support, RW management and to advance L LE   Stairs             Wheelchair Mobility    Modified Rankin (Stroke Patients Only)       Balance Overall balance assessment: Needs assistance Sitting-balance support: No upper extremity supported;Feet supported Sitting balance-Leahy Scale: Fair     Standing balance support: Bilateral upper extremity supported Standing balance-Leahy Scale: Poor                              Cognition Arousal/Alertness: Awake/alert Behavior During Therapy: WFL for tasks assessed/performed Overall Cognitive Status: Within Functional Limits for tasks assessed                                          Exercises      General Comments        Pertinent Vitals/Pain Pain Assessment: 0-10 Pain Score: 8  Pain Location: L hip Pain Descriptors / Indicators: Aching;Sore Pain Intervention(s): Limited activity within patient's tolerance;Monitored during session;Premedicated before session;Patient requesting pain meds-RN notified;Ice applied;Repositioned    Home Living                          Prior Function  PT Goals (current goals can now be found in the care plan section) Acute Rehab PT Goals Patient Stated Goal: REgain IND PT Goal Formulation: With patient Time For Goal Achievement: 06/29/21 Potential to Achieve Goals: Good Progress towards PT goals: Progressing toward goals    Frequency    7X/week      PT Plan Current plan remains appropriate    Co-evaluation              AM-PAC PT "6 Clicks" Mobility   Outcome Measure  Help needed turning from your back to your side while in a flat bed without using bedrails?: A Lot Help needed moving from lying on  your back to sitting on the side of a flat bed without using bedrails?: A Lot Help needed moving to and from a bed to a chair (including a wheelchair)?: A Lot Help needed standing up from a chair using your arms (e.g., wheelchair or bedside chair)?: A Lot Help needed to walk in hospital room?: Total Help needed climbing 3-5 steps with a railing? : Total 6 Click Score: 10    End of Session Equipment Utilized During Treatment: Gait belt Activity Tolerance: Patient limited by fatigue;Patient tolerated treatment well Patient left: in bed;with call bell/phone within reach;with bed alarm set;with nursing/sitter in room Nurse Communication: Mobility status PT Visit Diagnosis: Unsteadiness on feet (R26.81);Muscle weakness (generalized) (M62.81);Difficulty in walking, not elsewhere classified (R26.2);Pain Pain - Right/Left: Left Pain - part of body: Hip     Time: 9532-0233 PT Time Calculation (min) (ACUTE ONLY): 27 min  Charges:  $Gait Training: 8-22 mins $Therapeutic Activity: 8-22 mins                     Debe Coder PT Acute Rehabilitation Services Pager [phone] Office [phone]    [name] 06/23/2021, 4:19 PM

## 2021-06-23 NOTE — Progress Notes (Signed)
Subjective: 1 Day Post-Op Procedure(s) (LRB): LEFT TOTAL HIP ARTHROPLASTY ANTERIOR APPROACH (Left) Patient reports pain as moderate.  Awake and alert.  Does have acute blood loss anemia from surgery and slightly tachy and slight low BP.  Objective: Vital signs in last 24 hours: Temp:  [97.3 F (36.3 C)-98.3 F (36.8 C)] 98.3 F (36.8 C) (12/17 0352) Pulse Rate:  [99-120] 110 (12/17 0352) Resp:  [9-20] 17 (12/17 0352) BP: (94-132)/(50-99) 96/54 (12/17 0352) SpO2:  [97 %-100 %] 99 % (12/17 0352) Weight:  [63 kg] 63 kg (12/16 1357)  Intake/Output from previous day: 12/16 0701 - 12/17 0700 In: 1720 [P.O.:120; I.V.:1400; IV Piggyback:200] Out: 950 [Urine:800; Blood:150] Intake/Output this shift: No intake/output data recorded.  Recent Labs    06/23/21 0331  HGB 7.8*   Recent Labs    06/23/21 0331  WBC 6.8  RBC 2.65*  HCT 23.2*  PLT 211   Recent Labs    06/23/21 0331  NA 130*  K 4.3  CL 96*  CO2 27  BUN 31*  CREATININE 1.43*  GLUCOSE 160*  CALCIUM 8.4*   No results for input(s): LABPT, INR in the last 72 hours.  Sensation intact distally Intact pulses distally Dorsiflexion/Plantar flexion intact Incision: scant drainage   Assessment/Plan: 1 Day Post-Op Procedure(s) (LRB): LEFT TOTAL HIP ARTHROPLASTY ANTERIOR APPROACH (Left) Up with therapy Transfuse 1 unit of PRBC to treat anemia. Will likely need short-term skilled nursing placement.     Mcarthur Rossetti 06/23/2021, 8:27 AM

## 2021-06-23 NOTE — TOC Initial Note (Signed)
Transition of Care [facility]) - Initial/Assessment Note    Patient Details  Name: [name] MRN: [id number] Date of Birth: 1929-10-19  [facility]) CM/SW Contact:    Ross Ludwig, LCSW Phone Number: 06/23/2021, 10:12 AM  Clinical Narrative:                  CSW spoke to patient's daughter to discuss DME needs.  Per patient's daughter she does not need any equipment.  Patient's daughter is open to SNF placement if needed.  CSW to begin bed search for short term rehab, patient's daughter prefers [facility].  CSW to look for SNF placement.  Expected Discharge Plan: Skilled Nursing Facility Barriers to Discharge: Continued Medical Work up   Patient Goals and CMS Choice Patient states their goals for this hospitalization and ongoing recovery are:: To go to SNF for short term rehab then return back home. CMS Medicare.gov Compare Post Acute Care list provided to:: Patient Represenative (must comment) Choice offered to / list presented to : Adult Children  Expected Discharge Plan and Services Expected Discharge Plan: [place] Choice: [place] arrangements for the past 2 months: Single Family Home                 DME Arranged:  (Patient's family reports she has a RW and BSC/3in1, shower chair and hospital bed at home)         [place] Arranged: PT [place]: [place]        Prior Living Arrangements/Services Living arrangements for the past 2 months: Single Family Home Lives with:: Adult Children Patient language and need for interpreter reviewed:: Yes Do you feel safe going back to the place where you live?: No   Patient will need SNF placement for short term rehab.  Need for Family Participation in Patient Care: Yes (Comment) Care giver support system in place?: No (comment)   Criminal Activity/Legal Involvement Pertinent to Current Situation/Hospitalization: No - Comment as  needed  Activities of Daily Living Home Assistive Devices/Equipment: CBG Meter, Eyeglasses, Hearing aid, Walker (specify type), Bedside commode/3-in-1, Shower chair with back, Hospital bed, Wheelchair ADL Screening (condition at time of admission) Patient's cognitive ability adequate to safely complete daily activities?: Yes Is the patient deaf or have difficulty hearing?: Yes Does the patient have difficulty seeing, even when wearing glasses/contacts?: No Does the patient have difficulty concentrating, remembering, or making decisions?: Yes Patient able to express need for assistance with ADLs?: Yes Does the patient have difficulty dressing or bathing?: No Independently performs ADLs?: Yes (appropriate for developmental age) Does the patient have difficulty walking or climbing stairs?: Yes Weakness of Legs: Left Weakness of Arms/Hands: Right  Permission Sought/Granted Permission sought to share information with : Case Manager, Customer service manager, Family Supports Permission granted to share information with : Yes, Release of Information Signed  Share Information with NAME: Festus Barren Daughter [phone]  [phone]  Permission granted to share info w AGENCY: SNF admissions        Emotional Assessment Appearance:: Appears stated age   Affect (typically observed): Calm, Accepting, Stable Orientation: : Oriented to Self, Oriented to Place, Oriented to  Time, Oriented to Situation Alcohol / Substance Use: Not Applicable Psych Involvement: No (comment)  Admission diagnosis:  Status post total replacement of left hip [Z96.642] Patient Active Problem List   Diagnosis Date Noted   Status post total replacement of left hip 06/22/2021   Unilateral primary  osteoarthritis, left hip 04/23/2021   Peripheral edema 07/11/2020   Sleep disturbance 06/12/2020   Drug induced constipation    Neurogenic bladder    Constipation    Hypoalbuminemia due to protein-calorie  malnutrition (HCC)    Benign essential HTN    Stage 3b chronic kidney disease (HCC)    Chronic pain syndrome    Controlled type 2 diabetes mellitus with hyperglycemia, without long-term current use of insulin (HCC)    Lumbar radiculopathy 05/02/2020   Spondylolisthesis of lumbar region 04/24/2020   Swelling of left lower extremity 02/18/2020   Urinary retention with incomplete bladder emptying    Hip pain    Severe back pain    AKI (acute kidney injury) ([place])    Intractable pain/ left hip osteoarthritis 12/25/2019   Spinal stenosis, lumbar region, with neurogenic claudication 03/06/2016   Malignant neoplasm of right female breast ([place]) 01/11/2016   Controlled type 2 diabetes mellitus without complication, without long-term current use of insulin ([place]) 01/11/2016   Genetic testing 08/18/2015   Family history of breast cancer    Family history of prostate cancer    Family history of pancreatic cancer    Breast cancer of upper-outer quadrant of right female breast ([place]) 07/21/2015   Insomnia 05/06/2015   Medication management 12/16/2013   Hand arthritis 12/16/2013   Rectal bleeding 12/16/2013   Cold intolerance 06/17/2013   Renal insufficiency 12/23/2012   Post-menopause 12/23/2012   Hyperlipidemia 12/23/2012   Postmenopausal HRT (hormone replacement therapy) 12/23/2012   History of shingles 12/23/2012   Wears hearing aid 12/23/2012   Nonspecific abnormal unspecified cardiovascular function study 02/28/2012   Chronic sore throat 01/13/2012   Hoarse 01/13/2012   Chest pressure 12/03/2011   Itching  mid back 12/03/2011   Abnormal EKG 12/03/2011   Medicare annual wellness visit, subsequent 12/03/2011   DJD (degenerative joint disease) 12/03/2011   DIABETES MELLITUS, TYPE II, CONTROLLED 11/15/2009   VITAMIN D DEFICIENCY 11/15/2009   EXOGENOUS OBESITY 11/15/2009   ANXIETY DEPRESSION 12/22/2008   HYPERTHYROIDISM 08/23/2008   TOTAL KNEE REPLACEMENT, LEFT, HX OF 08/23/2008    Hypothyroidism 10/08/2007   ANEMIA 10/08/2007   Osteoarthritis 10/08/2007   OSTEOPENIA 10/08/2007   Dyslipidemia 11/07/2006   GERD (gastroesophageal reflux disease) 11/07/2006   PCP:  Burnis Medin, MD Pharmacy:   [facility] [id number] [place], [place] - [place] AT [facility] [place] [address] Phone: [phone] Fax: [phone]  [facility] [place], [place] [address] [place] [address] Phone: [phone] Fax: [phone]     Social Determinants of Health (SDOH) Interventions    Readmission Risk Interventions No flowsheet data found.

## 2021-06-23 NOTE — Discharge Instructions (Signed)

## 2021-06-23 NOTE — Plan of Care (Signed)
?  Problem: Activity: ?Goal: Risk for activity intolerance will decrease ?Outcome: Progressing ?  ?Problem: Safety: ?Goal: Ability to remain free from injury will improve ?Outcome: Progressing ?  ?Problem: Pain Managment: ?Goal: General experience of comfort will improve ?Outcome: Progressing ?  ?

## 2021-06-24 LAB — CBC
HCT: 25.4 % — ABNORMAL LOW (ref 36.0–46.0)
Hemoglobin: 8.4 g/dL — ABNORMAL LOW (ref 12.0–15.0)
MCH: 29.6 pg (ref 26.0–34.0)
MCHC: 33.1 g/dL (ref 30.0–36.0)
MCV: 89.4 fL (ref 80.0–100.0)
Platelets: 184 10*3/uL (ref 150–400)
RBC: 2.84 MIL/uL — ABNORMAL LOW (ref 3.87–5.11)
RDW: 14.1 % (ref 11.5–15.5)
WBC: 6.4 10*3/uL (ref 4.0–10.5)
nRBC: 0 % (ref 0.0–0.2)

## 2021-06-24 NOTE — Progress Notes (Signed)
Physical Therapy Treatment Patient Details Name: [name] MRN: [id number] DOB: 06/06/1930 Today's Date: 06/24/2021   History of Present Illness Pt s/p L THR and with hx of bil TKR, back surgery, RBBB, DDD    PT Comments    Pt continues very cooperative but limited this am 2* lethargy/fatigue - CNA reports pt falling asleep while eating and while brushing teeth.  With increased input to gain participation, pt performed therex program with assist but OOB deferred to later with pt hopefully more alert.   Recommendations for follow up therapy are one component of a multi-disciplinary discharge planning process, led by the attending physician.  Recommendations may be updated based on patient status, additional functional criteria and insurance authorization.  Follow Up Recommendations  Skilled nursing-short term rehab (<3 hours/day)     Assistance Recommended at Discharge Frequent or constant Supervision/Assistance  Equipment Recommendations  None recommended by PT    Recommendations for Other Services       Precautions / Restrictions Precautions Precautions: Fall Restrictions Weight Bearing Restrictions: No LLE Weight Bearing: Weight bearing as tolerated     Mobility  Bed Mobility               General bed mobility comments: not tested 2* pt fatigue/lethargy    Transfers                        Ambulation/Gait                   Stairs             Wheelchair Mobility    Modified Rankin (Stroke Patients Only)       Balance                                            Cognition Arousal/Alertness: Lethargic (requring increased input for particiation on task) Behavior During Therapy: Flat affect Overall Cognitive Status: Within Functional Limits for tasks assessed                                 General Comments: Pt states slept for 12 hours last night and just can't seem to stay awake - CNA  advises pt falling asleep this moring during breakfast and while brushing teeth        Exercises Total Joint Exercises Ankle Circles/Pumps: AROM;Both;15 reps;Supine Quad Sets: AROM;Both;10 reps;Supine Heel Slides: AAROM;Left;20 reps;Supine Hip ABduction/ADduction: AAROM;Left;15 reps;Supine    General Comments        Pertinent Vitals/Pain Pain Assessment: 0-10 Pain Score: 4  Pain Location: L hip Pain Descriptors / Indicators: Aching;Sore Pain Intervention(s): Limited activity within patient's tolerance;Monitored during session;Premedicated before session    Home Living                          Prior Function            PT Goals (current goals can now be found in the care plan section) Acute Rehab PT Goals Patient Stated Goal: REgain IND PT Goal Formulation: With patient Time For Goal Achievement: 06/29/21 Potential to Achieve Goals: Good Progress towards PT goals: Not progressing toward goals - comment (lethargy/fatigue)    Frequency    7X/week      PT Plan Current  plan remains appropriate    Co-evaluation              AM-PAC PT "6 Clicks" Mobility   Outcome Measure  Help needed turning from your back to your side while in a flat bed without using bedrails?: A Lot Help needed moving from lying on your back to sitting on the side of a flat bed without using bedrails?: A Lot Help needed moving to and from a bed to a chair (including a wheelchair)?: A Lot Help needed standing up from a chair using your arms (e.g., wheelchair or bedside chair)?: A Lot Help needed to walk in hospital room?: Total Help needed climbing 3-5 steps with a railing? : Total 6 Click Score: 10    End of Session Equipment Utilized During Treatment: Gait belt Activity Tolerance: Patient limited by fatigue Patient left: in bed;with call bell/phone within reach;with bed alarm set;with nursing/sitter in room Nurse Communication: Mobility status PT Visit Diagnosis:  Unsteadiness on feet (R26.81);Muscle weakness (generalized) (M62.81);Difficulty in walking, not elsewhere classified (R26.2);Pain Pain - Right/Left: Left Pain - part of body: Hip     Time: 1740-8144 PT Time Calculation (min) (ACUTE ONLY): 20 min  Charges:  $Therapeutic Exercise: 8-22 mins                     Debe Coder PT Acute Rehabilitation Services Pager [phone] Office [phone]    Josie Burleigh 06/24/2021, 12:13 PM

## 2021-06-24 NOTE — Plan of Care (Signed)
  Problem: Activity: Goal: Risk for activity intolerance will decrease Outcome: Progressing   Problem: Pain Managment: Goal: General experience of comfort will improve Outcome: Progressing   Problem: Safety: Goal: Ability to remain free from injury will improve Outcome: Progressing   

## 2021-06-24 NOTE — Progress Notes (Signed)
Physical Therapy Treatment Patient Details Name: [name] MRN: [id number] DOB: [birth year] Today's Date: 06/24/2021   History of Present Illness Pt s/p L THR and with hx of bil TKR, back surgery, RBBB, DDD    PT Comments    Pt continues very cooperative but progressing slowly with mobility and requires increased time and significant assist for safe performance of basic mobility tasks.  Pt would benefit from follow up rehab at SNF level to maximize IND and safety prior to dc home with limited assist.   Recommendations for follow up therapy are one component of a multi-disciplinary discharge planning process, led by the attending physician.  Recommendations may be updated based on patient status, additional functional criteria and insurance authorization.  Follow Up Recommendations  Skilled nursing-short term rehab (<3 hours/day)     Assistance Recommended at Discharge Frequent or constant Supervision/Assistance  Equipment Recommendations  None recommended by PT    Recommendations for Other Services       Precautions / Restrictions Precautions Precautions: Fall Restrictions Weight Bearing Restrictions: No LLE Weight Bearing: Weight bearing as tolerated     Mobility  Bed Mobility Overal bed mobility: Needs Assistance Bed Mobility: Supine to Sit     Supine to sit: Mod assist     General bed mobility comments: Use of bed rails, increased time with assist to manage L LE, control trunk and complete rotation to EOB with bed pad    Transfers Overall transfer level: Needs assistance Equipment used: Rolling walker (2 wheels) Transfers: Sit to/from Stand Sit to Stand: Min assist;Mod assist           General transfer comment: cues for LE management and use of UEs to self assist; assist to bring wt up and fwd and to balance in standing    Ambulation/Gait Ambulation/Gait assistance: Min assist;Mod assist;+2 safety/equipment Gait Distance (Feet): 14 Feet Assistive  device: Rolling walker (2 wheels) Gait Pattern/deviations: Step-to pattern;Decreased step length - left;Shuffle;Trunk flexed Gait velocity: decr     General Gait Details: Increased time with cues for sequence, posture and position from RW.  Assist for balance/support, RW management and to advance L LE   Stairs             Wheelchair Mobility    Modified Rankin (Stroke Patients Only)       Balance Overall balance assessment: Needs assistance Sitting-balance support: No upper extremity supported;Feet supported Sitting balance-Leahy Scale: Fair     Standing balance support: Bilateral upper extremity supported Standing balance-Leahy Scale: Poor                              Cognition Arousal/Alertness: Awake/alert Behavior During Therapy: Flat affect Overall Cognitive Status: Within Functional Limits for tasks assessed                                          Exercises      General Comments        Pertinent Vitals/Pain Pain Assessment: 0-10 Pain Score: 4  Pain Location: L hip Pain Descriptors / Indicators: Aching;Sore Pain Intervention(s): Limited activity within patient's tolerance;Monitored during session;Premedicated before session;Ice applied    Home Living                          Prior Function  PT Goals (current goals can now be found in the care plan section) Acute Rehab PT Goals Patient Stated Goal: REgain IND PT Goal Formulation: With patient Time For Goal Achievement: 06/29/21 Potential to Achieve Goals: Good Progress towards PT goals: Progressing toward goals    Frequency    7X/week      PT Plan Current plan remains appropriate    Co-evaluation              AM-PAC PT "6 Clicks" Mobility   Outcome Measure  Help needed turning from your back to your side while in a flat bed without using bedrails?: A Lot Help needed moving from lying on your back to sitting on the side of a  flat bed without using bedrails?: A Lot Help needed moving to and from a bed to a chair (including a wheelchair)?: A Lot Help needed standing up from a chair using your arms (e.g., wheelchair or bedside chair)?: A Lot Help needed to walk in hospital room?: Total Help needed climbing 3-5 steps with a railing? : Total 6 Click Score: 10    End of Session Equipment Utilized During Treatment: Gait belt Activity Tolerance: Patient limited by fatigue Patient left: with call bell/phone within reach;with nursing/sitter in room;in chair;with chair alarm set Nurse Communication: Mobility status PT Visit Diagnosis: Unsteadiness on feet (R26.81);Muscle weakness (generalized) (M62.81);Difficulty in walking, not elsewhere classified (R26.2);Pain Pain - Right/Left: Left Pain - part of body: Hip     Time: 7628-3151 PT Time Calculation (min) (ACUTE ONLY): 27 min  Charges:  $Gait Training: 8-22 mins $Therapeutic Activity: 8-22 mins                     [place] Pager [phone] Office [phone]    Finch Costanzo 06/24/2021, 4:18 PM

## 2021-06-24 NOTE — Progress Notes (Signed)
Patient ID: [name], female   DOB: 09-22-1929, 87 y.o.   MRN: [id number] Patient making slow progress with therapy anticipate discharge to skilled nursing facility.  Patient has no complaints this morning. she states she is tired.

## 2021-06-24 NOTE — Plan of Care (Signed)
°  Problem: Pain Managment: Goal: General experience of comfort will improve 06/24/2021 0412 by Blase Mess, RN Outcome: Progressing 06/24/2021 0405 by Blase Mess, RN Outcome: Progressing   Problem: Safety: Goal: Ability to remain free from injury will improve 06/24/2021 0412 by Blase Mess, RN Outcome: Progressing 06/24/2021 0405 by Blase Mess, RN Outcome: Progressing

## 2021-06-24 NOTE — Plan of Care (Signed)
  Problem: Coping: Goal: Level of anxiety will decrease Outcome: Progressing   Problem: Pain Managment: Goal: General experience of comfort will improve Outcome: Progressing   Problem: Safety: Goal: Ability to remain free from injury will improve Outcome: Progressing   

## 2021-06-25 ENCOUNTER — Encounter (HOSPITAL_COMMUNITY): Payer: Self-pay | Admitting: Orthopaedic Surgery

## 2021-06-25 DIAGNOSIS — Z7984 Long term (current) use of oral hypoglycemic drugs: Secondary | ICD-10-CM | POA: Diagnosis not present

## 2021-06-25 DIAGNOSIS — Z96642 Presence of left artificial hip joint: Secondary | ICD-10-CM | POA: Diagnosis not present

## 2021-06-25 DIAGNOSIS — N1832 Chronic kidney disease, stage 3b: Secondary | ICD-10-CM | POA: Diagnosis not present

## 2021-06-25 DIAGNOSIS — M8580 Other specified disorders of bone density and structure, unspecified site: Secondary | ICD-10-CM | POA: Diagnosis not present

## 2021-06-25 DIAGNOSIS — M25552 Pain in left hip: Secondary | ICD-10-CM | POA: Diagnosis not present

## 2021-06-25 DIAGNOSIS — Z7409 Other reduced mobility: Secondary | ICD-10-CM | POA: Diagnosis not present

## 2021-06-25 DIAGNOSIS — M519 Unspecified thoracic, thoracolumbar and lumbosacral intervertebral disc disorder: Secondary | ICD-10-CM | POA: Diagnosis not present

## 2021-06-25 DIAGNOSIS — E119 Type 2 diabetes mellitus without complications: Secondary | ICD-10-CM | POA: Diagnosis not present

## 2021-06-25 DIAGNOSIS — R338 Other retention of urine: Secondary | ICD-10-CM | POA: Diagnosis not present

## 2021-06-25 DIAGNOSIS — M1612 Unilateral primary osteoarthritis, left hip: Secondary | ICD-10-CM | POA: Diagnosis not present

## 2021-06-25 DIAGNOSIS — N319 Neuromuscular dysfunction of bladder, unspecified: Secondary | ICD-10-CM | POA: Diagnosis not present

## 2021-06-25 DIAGNOSIS — M48062 Spinal stenosis, lumbar region with neurogenic claudication: Secondary | ICD-10-CM | POA: Diagnosis not present

## 2021-06-25 DIAGNOSIS — I129 Hypertensive chronic kidney disease with stage 1 through stage 4 chronic kidney disease, or unspecified chronic kidney disease: Secondary | ICD-10-CM | POA: Diagnosis not present

## 2021-06-25 DIAGNOSIS — R531 Weakness: Secondary | ICD-10-CM | POA: Diagnosis not present

## 2021-06-25 DIAGNOSIS — E559 Vitamin D deficiency, unspecified: Secondary | ICD-10-CM | POA: Diagnosis not present

## 2021-06-25 DIAGNOSIS — M62838 Other muscle spasm: Secondary | ICD-10-CM | POA: Diagnosis not present

## 2021-06-25 DIAGNOSIS — E785 Hyperlipidemia, unspecified: Secondary | ICD-10-CM | POA: Diagnosis not present

## 2021-06-25 DIAGNOSIS — Z466 Encounter for fitting and adjustment of urinary device: Secondary | ICD-10-CM | POA: Diagnosis not present

## 2021-06-25 DIAGNOSIS — Z7401 Bed confinement status: Secondary | ICD-10-CM | POA: Diagnosis not present

## 2021-06-25 DIAGNOSIS — F419 Anxiety disorder, unspecified: Secondary | ICD-10-CM | POA: Diagnosis not present

## 2021-06-25 DIAGNOSIS — K5903 Drug induced constipation: Secondary | ICD-10-CM | POA: Diagnosis not present

## 2021-06-25 DIAGNOSIS — Z853 Personal history of malignant neoplasm of breast: Secondary | ICD-10-CM | POA: Diagnosis not present

## 2021-06-25 DIAGNOSIS — Z471 Aftercare following joint replacement surgery: Secondary | ICD-10-CM | POA: Diagnosis not present

## 2021-06-25 DIAGNOSIS — K5900 Constipation, unspecified: Secondary | ICD-10-CM | POA: Diagnosis not present

## 2021-06-25 DIAGNOSIS — T8484XA Pain due to internal orthopedic prosthetic devices, implants and grafts, initial encounter: Secondary | ICD-10-CM | POA: Diagnosis not present

## 2021-06-25 DIAGNOSIS — R339 Retention of urine, unspecified: Secondary | ICD-10-CM | POA: Diagnosis not present

## 2021-06-25 DIAGNOSIS — G8918 Other acute postprocedural pain: Secondary | ICD-10-CM | POA: Diagnosis not present

## 2021-06-25 DIAGNOSIS — E1122 Type 2 diabetes mellitus with diabetic chronic kidney disease: Secondary | ICD-10-CM | POA: Diagnosis not present

## 2021-06-25 DIAGNOSIS — D649 Anemia, unspecified: Secondary | ICD-10-CM | POA: Diagnosis not present

## 2021-06-25 LAB — TYPE AND SCREEN
ABO/RH(D): O POS
Antibody Screen: NEGATIVE
Unit division: 0

## 2021-06-25 LAB — CBC
HCT: 28.7 % — ABNORMAL LOW (ref 36.0–46.0)
Hemoglobin: 9.3 g/dL — ABNORMAL LOW (ref 12.0–15.0)
MCH: 29.4 pg (ref 26.0–34.0)
MCHC: 32.4 g/dL (ref 30.0–36.0)
MCV: 90.8 fL (ref 80.0–100.0)
Platelets: 206 10*3/uL (ref 150–400)
RBC: 3.16 MIL/uL — ABNORMAL LOW (ref 3.87–5.11)
RDW: 14.6 % (ref 11.5–15.5)
WBC: 5.8 10*3/uL (ref 4.0–10.5)
nRBC: 0 % (ref 0.0–0.2)

## 2021-06-25 LAB — RESP PANEL BY RT-PCR (FLU A&B, COVID) ARPGX2
Influenza A by PCR: NEGATIVE
Influenza B by PCR: NEGATIVE
SARS Coronavirus 2 by RT PCR: NEGATIVE

## 2021-06-25 LAB — BPAM RBC
Blood Product Expiration Date: 202301172359
ISSUE DATE / TIME: 202212171122
Unit Type and Rh: 5100

## 2021-06-25 MED ORDER — ASPIRIN 81 MG PO CHEW: 81.0000 mg | CHEWABLE_TABLET | Freq: Two times a day (BID) | ORAL | 0 refills | Status: DC

## 2021-06-25 MED ORDER — METHOCARBAMOL 500 MG PO TABS
500.0000 mg | ORAL_TABLET | Freq: Four times a day (QID) | ORAL | 1 refills | Status: DC | PRN
Start: 2021-06-25 — End: 2021-11-19

## 2021-06-25 MED ORDER — HYDROCODONE-ACETAMINOPHEN 5-325 MG PO TABS: 1.0000 | ORAL_TABLET | ORAL | 0 refills | Status: DC | PRN

## 2021-06-25 NOTE — TOC Transition Note (Signed)
Transition of Care [facility]) - CM/SW Discharge Note   Patient Details  Name: [name] MRN: [id number] Date of Birth: April 25, 1930  [facility]) CM/SW Contact:  Lennart Pall, LCSW Phone Number: 06/25/2021, 12:52 PM   Clinical Narrative:    Pt medically cleared for dc today and pt has accepted SNF bed at [facility].  PTAR called at 12:45pm.  RN to call report to [phone]. No further TOC needs.   Final next level of care: Skilled Nursing Facility Barriers to Discharge: Barriers Resolved   Patient Goals and CMS Choice Patient states their goals for this hospitalization and ongoing recovery are:: To go to SNF for short term rehab then return back home. CMS Medicare.gov Compare Post Acute Care list provided to:: Patient Represenative (must comment) Choice offered to / list presented to : Adult Children  Discharge Placement              Patient chooses bed at: [facility] Patient to be transferred to facility by: [place] Name of family member notified: daughter Patient and family notified of of transfer: 06/25/21  Discharge Plan and Services     Post Acute Care Choice: [place]          DME Arranged:  (Patient's family reports she has a RW and BSC/3in1, shower chair and hospital bed at home)         [place] Arranged: NA [place] Agency: NA        Social Determinants of Health ([place]) Interventions     Readmission Risk Interventions No flowsheet data found.

## 2021-06-25 NOTE — Discharge Summary (Signed)
Patient ID: AKILI CUDA MRN: [id number] DOB/AGE: [id number] y.o.  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 , HER2/neu negative-- s/p right lumpectomy (without SLNB) and radiation therapy   Chronic lower back pain    DDD (degenerative disc disease)    lumbar   Diverticulosis    Family history of breast cancer    Family history of pancreatic cancer    Family history of prostate cancer    Flaccid neuropathic bladder, not elsewhere classified    Foley catheter in place    GERD (gastroesophageal reflux disease)    Hemorrhoids    Hiatal hernia    History of colon polyps    History of radiation therapy 09-12-2015 to 10-12-2015   42.72 gray in 16 fractions directed right breast w/ boost of 12 gray in 6 fractions directed at the lumpectomy cavity- Total dose: 54.72y   History of recurrent UTIs    Hyperlipidemia    RBBB (right bundle branch block with left anterior fascicular block)    Type 2 diabetes mellitus ([place])    Urinary retention with incomplete bladder emptying    Wears dentures    full upper and lower partial   Wears glasses    Wears hearing aid    bilateral but does not wear    Surgeries: Procedure(s): LEFT TOTAL HIP ARTHROPLASTY ANTERIOR APPROACH on 06/22/2021   Consultants:   Discharged Condition: Improved  Hospital Course: NECIA KAMM is an 87 y.o. female who was admitted 06/22/2021 for operative treatment ofUnilateral primary osteoarthritis, left hip. Patient has severe unremitting pain that affects  sleep, daily activities, and work/hobbies. After pre-op clearance the patient was taken to the operating room on 06/22/2021 and underwent  Procedure(s): LEFT TOTAL HIP ARTHROPLASTY ANTERIOR APPROACH.    Patient was given perioperative antibiotics:  Anti-infectives (From admission, onward)    Start     Dose/Rate Route Frequency Ordered Stop   06/22/21 1700  ceFAZolin (ANCEF) IVPB 1 g/50 mL premix        1 g 100 mL/hr over 30 Minutes Intravenous Every 6 hours 06/22/21 1358 06/22/21 2258   06/22/21 0800  ceFAZolin (ANCEF) IVPB 2g/100 mL premix        2 g 200 mL/hr over 30 Minutes Intravenous On call to O.R. 06/22/21 0758 06/22/21 1118        Patient was given sequential compression devices, early ambulation, and chemoprophylaxis to prevent DVT.  Patient benefited maximally from hospital stay and there were no complications.    Recent vital signs: Patient Vitals for the past 24 hrs:  BP Temp Temp src Pulse Resp SpO2  06/25/21 0525 (!) 143/66 97.8 F (36.6 C) -- (!) 109 14 95 %  06/24/21 2047 (!) 142/59 97.8 F (36.6 C) Oral (!) 109 16 98 %  06/24/21 1831 (!) 105/46 98.3 F (36.8 C) Oral (!) 116 18 --  06/24/21 1352 (!) 103/43 98.2 F (36.8 C) Oral (!) 113 17 95 %     Recent laboratory studies:  Recent Labs    06/23/21  5329 06/24/21 0336 06/25/21 0324  WBC 6.8 6.4 5.8  HGB 7.8* 8.4* 9.3*  HCT 23.2* 25.4* 28.7*  PLT 211 184 206  NA 130*  --   --   K 4.3  --   --   CL 96*  --   --   CO2 27  --   --   BUN 31*  --   --   CREATININE 1.43*  --   --   GLUCOSE 160*  --   --   CALCIUM 8.4*  --   --      Discharge Medications:   Allergies as of 06/25/2021       Reactions   Phenergan [promethazine]    Slurred speech, acted very confused   Remeron [mirtazapine] Itching        Medication List     STOP taking these medications    aspirin EC 81 MG tablet Replaced by: aspirin 81 MG chewable tablet   traMADol 50 MG tablet Commonly known as: ULTRAM       TAKE  these medications    acetaminophen 650 MG CR tablet Commonly known as: TYLENOL Take 650 mg by mouth every 8 (eight) hours as needed for pain.   ALPRAZolam 1 MG tablet Commonly known as: XANAX Take 0.5-1 tablets (0.5-1 mg total) by mouth at bedtime as needed for anxiety. Avoid regular use.   aspirin 81 MG chewable tablet Chew 1 tablet (81 mg total) by mouth 2 (two) times daily. Replaces: aspirin EC 81 MG tablet   atorvastatin 10 MG tablet Commonly known as: LIPITOR TAKE 1 TABLET DAILY   cholecalciferol 25 MCG (1000 UNIT) tablet Commonly known as: VITAMIN D3 Take 1,000 Units by mouth daily.   colchicine 0.6 MG tablet Take 1 tablet (0.6 mg total) by mouth 2 (two) times daily. For probable gout, stop atorvastatin while  on this medication   cyanocobalamin 1000 MCG tablet Take 1,000 mcg by mouth at bedtime.   docusate sodium 100 MG capsule Commonly known as: COLACE Take 1 capsule (100 mg total) by mouth 2 (two) times daily. What changed: when to take this   esomeprazole 40 MG capsule Commonly known as: NEXIUM TAKE 1 CAPSULE DAILY   furosemide 40 MG tablet Commonly known as: LASIX TAKE 1 TABLET DAILY OR AS DIRECTED   glipiZIDE 10 MG tablet Commonly known as: GLUCOTROL TAKE 1 TABLET IN THE MORNING   HYDROcodone-acetaminophen 5-325 MG tablet Commonly known as: NORCO/VICODIN Take 1-2 tablets by mouth every 4 (four) hours as needed for moderate pain (pain score 4-6).   methocarbamol 500 MG tablet Commonly known as: ROBAXIN Take 1 tablet (500 mg total) by mouth every 6 (six) hours as needed for muscle spasms.   Movantik 25 MG Tabs tablet Generic drug: naloxegol oxalate TAKE 1 TABLET(25 MG) BY MOUTH DAILY   senna-docusate 8.6-50 MG tablet Commonly known as: Senokot-S Take 2 tablets by mouth 2 (two) times daily.   Trospium Chloride 60 MG Cp24 Take 60 mg by mouth daily.               Durable Medical Equipment  (From admission, onward)           Start      Ordered   06/22/21 1359  DME 3 n 1  Once        06/22/21 1358            Diagnostic Studies: DG Pelvis Portable  Result Date: 06/22/2021 CLINICAL DATA:  Postop left total  hip replacement EXAM: PORTABLE PELVIS 1-2 VIEWS COMPARISON:  08/14/2018 FINDINGS: New left total hip arthroplasty is in normal alignment without evidence of loosening or periprosthetic fracture. Expected soft tissue changes. Mild right hip osteoarthritis. Partially visualized lumbar spine fusion hardware. IMPRESSION: No evidence of left total hip arthroplasty complication. Electronically Signed   By: Maurine Simmering M.D.   On: 06/22/2021 13:59   DG C-Arm 1-60 Min-No Report  Result Date: 06/22/2021 Fluoroscopy was utilized by the requesting physician.  No radiographic interpretation.   DG HIP UNILAT WITH PELVIS 2-3 VIEWS LEFT  Result Date: 06/22/2021 CLINICAL DATA:  Left hip replacement EXAM: DG HIP (WITH OR WITHOUT PELVIS) 2-3V LEFT COMPARISON:  08/14/2020 FINDINGS: Intraoperative images during left total hip arthroplasty. Normal alignment at evidence of loosening or periprosthetic fracture. IMPRESSION: Intraoperative images during left total hip arthroplasty. No evidence of immediate hardware complication. Electronically Signed   By: Maurine Simmering M.D.   On: 06/22/2021 13:58    Disposition: Discharge disposition: 03-Skilled [facility]     Mcarthur Rossetti, MD. Go on 07/05/2021.   Specialty: Orthopedic Surgery Why: at 2:00 pm for your 2 week post operative appointment with Dr. Ninfa Linden. Contact information: [place] [address] [phone]                  Signed: Erskine Emery 06/25/2021, 10:59 AM

## 2021-06-25 NOTE — Progress Notes (Signed)
Subjective: 3 Days Post-Op Procedure(s) (LRB): LEFT TOTAL HIP ARTHROPLASTY ANTERIOR APPROACH (Left) Patient reports pain as moderate.  Slow progress with PT. States she has no one to help her at home. No complaints .   Objective: Vital signs in last 24 hours: Temp:  [97.8 F (36.6 C)-98.4 F (36.9 C)] 97.8 F (36.6 C) (12/19 0525) Pulse Rate:  [109-116] 109 (12/19 0525) Resp:  [14-18] 14 (12/19 0525) BP: (103-143)/(43-66) 143/66 (12/19 0525) SpO2:  [94 %-98 %] 95 % (12/19 0525)  Intake/Output from previous day: 12/18 0701 - 12/19 0700 In: 840 [P.O.:840] Out: 2000 [Urine:2000] Intake/Output this shift: No intake/output data recorded.  Recent Labs    06/23/21 0331 06/24/21 0336 06/25/21 0324  HGB 7.8* 8.4* 9.3*   Recent Labs    06/24/21 0336 06/25/21 0324  WBC 6.4 5.8  RBC 2.84* 3.16*  HCT 25.4* 28.7*  PLT 184 206   Recent Labs    06/23/21 0331  NA 130*  K 4.3  CL 96*  CO2 27  BUN 31*  CREATININE 1.43*  GLUCOSE 160*  CALCIUM 8.4*   No results for input(s): LABPT, INR in the last 72 hours.  Dorsiflexion/Plantar flexion intact Incision: scant drainage Compartment soft   Assessment/Plan: 3 Days Post-Op Procedure(s) (LRB): LEFT TOTAL HIP ARTHROPLASTY ANTERIOR APPROACH (Left) Up with therapy Discharge to SNF when bed available       Edgardo Petrenko 06/25/2021, 8:36 AM

## 2021-06-25 NOTE — Plan of Care (Signed)
Pt ready to DC via [facility] to [facility]F.

## 2021-06-27 DIAGNOSIS — Z96642 Presence of left artificial hip joint: Secondary | ICD-10-CM | POA: Diagnosis not present

## 2021-06-27 DIAGNOSIS — Z7984 Long term (current) use of oral hypoglycemic drugs: Secondary | ICD-10-CM | POA: Diagnosis not present

## 2021-06-27 DIAGNOSIS — E119 Type 2 diabetes mellitus without complications: Secondary | ICD-10-CM | POA: Diagnosis not present

## 2021-06-27 DIAGNOSIS — E785 Hyperlipidemia, unspecified: Secondary | ICD-10-CM | POA: Diagnosis not present

## 2021-06-27 DIAGNOSIS — M1612 Unilateral primary osteoarthritis, left hip: Secondary | ICD-10-CM | POA: Diagnosis not present

## 2021-06-27 DIAGNOSIS — R339 Retention of urine, unspecified: Secondary | ICD-10-CM | POA: Diagnosis not present

## 2021-06-27 DIAGNOSIS — K5900 Constipation, unspecified: Secondary | ICD-10-CM | POA: Diagnosis not present

## 2021-06-27 DIAGNOSIS — Z7409 Other reduced mobility: Secondary | ICD-10-CM | POA: Diagnosis not present

## 2021-06-28 ENCOUNTER — Telehealth: Payer: Self-pay | Admitting: *Deleted

## 2021-06-28 NOTE — Telephone Encounter (Signed)
Ortho bundle 7 day call to patient.

## 2021-06-28 NOTE — Telephone Encounter (Signed)
(  Late entry) for discharge call on Tuesday, 06/26/21. Spoke with patient's daughter.

## 2021-06-29 DIAGNOSIS — Z7409 Other reduced mobility: Secondary | ICD-10-CM | POA: Diagnosis not present

## 2021-06-29 DIAGNOSIS — M25552 Pain in left hip: Secondary | ICD-10-CM | POA: Diagnosis not present

## 2021-06-29 DIAGNOSIS — T8484XA Pain due to internal orthopedic prosthetic devices, implants and grafts, initial encounter: Secondary | ICD-10-CM | POA: Diagnosis not present

## 2021-06-29 DIAGNOSIS — M62838 Other muscle spasm: Secondary | ICD-10-CM | POA: Diagnosis not present

## 2021-06-29 DIAGNOSIS — K5900 Constipation, unspecified: Secondary | ICD-10-CM | POA: Diagnosis not present

## 2021-06-29 DIAGNOSIS — G8918 Other acute postprocedural pain: Secondary | ICD-10-CM | POA: Diagnosis not present

## 2021-07-05 ENCOUNTER — Ambulatory Visit (INDEPENDENT_AMBULATORY_CARE_PROVIDER_SITE_OTHER): Payer: Medicare Other | Admitting: Orthopaedic Surgery

## 2021-07-05 ENCOUNTER — Encounter: Payer: Self-pay | Admitting: Orthopaedic Surgery

## 2021-07-05 ENCOUNTER — Telehealth: Payer: Self-pay | Admitting: *Deleted

## 2021-07-05 DIAGNOSIS — Z96642 Presence of left artificial hip joint: Secondary | ICD-10-CM

## 2021-07-05 NOTE — Progress Notes (Signed)
The patient is 2 weeks status post a left total hip arthroplasty.  Her incision looks great remove the staples and place Steri-Strips.  She is staying at a nursing facility which is [name].  She is having a hard time with mobility and getting around in general.  She is a very frail 87 years old.  Her leg lengths are equal and have her lay supine she does tolerating putting her hip through motion.  Again we remove the staples in place Steri-Strips and the incision looks good.  We will see her back in 4 weeks to see how she is doing overall.  She said that she would like to have a steroid injection in her shoulder at that visit.

## 2021-07-05 NOTE — Telephone Encounter (Signed)
Ortho bundle 14 day in office appointment completed.

## 2021-07-16 DIAGNOSIS — R338 Other retention of urine: Secondary | ICD-10-CM | POA: Diagnosis not present

## 2021-07-17 ENCOUNTER — Telehealth: Payer: Self-pay | Admitting: Orthopaedic Surgery

## 2021-07-17 NOTE — Telephone Encounter (Signed)
Called and advised.

## 2021-07-17 NOTE — Telephone Encounter (Signed)
Nursing home called and is wondering when they should go back to 1 Asprin a day?   CB [phone]

## 2021-07-29 DIAGNOSIS — Z9350 Unspecified cystostomy status: Secondary | ICD-10-CM | POA: Diagnosis not present

## 2021-07-29 DIAGNOSIS — L89620 Pressure ulcer of left heel, unstageable: Secondary | ICD-10-CM | POA: Diagnosis not present

## 2021-07-29 DIAGNOSIS — I452 Bifascicular block: Secondary | ICD-10-CM | POA: Diagnosis not present

## 2021-07-29 DIAGNOSIS — G8929 Other chronic pain: Secondary | ICD-10-CM | POA: Diagnosis not present

## 2021-07-29 DIAGNOSIS — K219 Gastro-esophageal reflux disease without esophagitis: Secondary | ICD-10-CM | POA: Diagnosis not present

## 2021-07-29 DIAGNOSIS — Z7982 Long term (current) use of aspirin: Secondary | ICD-10-CM | POA: Diagnosis not present

## 2021-07-29 DIAGNOSIS — E785 Hyperlipidemia, unspecified: Secondary | ICD-10-CM | POA: Diagnosis not present

## 2021-07-29 DIAGNOSIS — Z79891 Long term (current) use of opiate analgesic: Secondary | ICD-10-CM | POA: Diagnosis not present

## 2021-07-29 DIAGNOSIS — Z471 Aftercare following joint replacement surgery: Secondary | ICD-10-CM | POA: Diagnosis not present

## 2021-07-29 DIAGNOSIS — Z96652 Presence of left artificial knee joint: Secondary | ICD-10-CM | POA: Diagnosis not present

## 2021-07-29 DIAGNOSIS — M519 Unspecified thoracic, thoracolumbar and lumbosacral intervertebral disc disorder: Secondary | ICD-10-CM | POA: Diagnosis not present

## 2021-07-29 DIAGNOSIS — Z7984 Long term (current) use of oral hypoglycemic drugs: Secondary | ICD-10-CM | POA: Diagnosis not present

## 2021-07-29 DIAGNOSIS — M545 Low back pain, unspecified: Secondary | ICD-10-CM | POA: Diagnosis not present

## 2021-07-29 DIAGNOSIS — R339 Retention of urine, unspecified: Secondary | ICD-10-CM | POA: Diagnosis not present

## 2021-07-29 DIAGNOSIS — M1990 Unspecified osteoarthritis, unspecified site: Secondary | ICD-10-CM | POA: Diagnosis not present

## 2021-07-29 DIAGNOSIS — E119 Type 2 diabetes mellitus without complications: Secondary | ICD-10-CM | POA: Diagnosis not present

## 2021-07-29 DIAGNOSIS — C50411 Malignant neoplasm of upper-outer quadrant of right female breast: Secondary | ICD-10-CM | POA: Diagnosis not present

## 2021-07-29 DIAGNOSIS — I10 Essential (primary) hypertension: Secondary | ICD-10-CM | POA: Diagnosis not present

## 2021-07-29 DIAGNOSIS — N312 Flaccid neuropathic bladder, not elsewhere classified: Secondary | ICD-10-CM | POA: Diagnosis not present

## 2021-07-29 DIAGNOSIS — Z466 Encounter for fitting and adjustment of urinary device: Secondary | ICD-10-CM | POA: Diagnosis not present

## 2021-07-29 DIAGNOSIS — K5900 Constipation, unspecified: Secondary | ICD-10-CM | POA: Diagnosis not present

## 2021-08-01 ENCOUNTER — Ambulatory Visit (INDEPENDENT_AMBULATORY_CARE_PROVIDER_SITE_OTHER): Payer: Medicare Other | Admitting: Orthopaedic Surgery

## 2021-08-01 ENCOUNTER — Encounter: Payer: Self-pay | Admitting: Orthopaedic Surgery

## 2021-08-01 DIAGNOSIS — E119 Type 2 diabetes mellitus without complications: Secondary | ICD-10-CM | POA: Diagnosis not present

## 2021-08-01 DIAGNOSIS — Z471 Aftercare following joint replacement surgery: Secondary | ICD-10-CM | POA: Diagnosis not present

## 2021-08-01 DIAGNOSIS — Z96642 Presence of left artificial hip joint: Secondary | ICD-10-CM

## 2021-08-01 DIAGNOSIS — Z96652 Presence of left artificial knee joint: Secondary | ICD-10-CM | POA: Diagnosis not present

## 2021-08-01 DIAGNOSIS — I10 Essential (primary) hypertension: Secondary | ICD-10-CM | POA: Diagnosis not present

## 2021-08-01 DIAGNOSIS — L89620 Pressure ulcer of left heel, unstageable: Secondary | ICD-10-CM | POA: Diagnosis not present

## 2021-08-01 DIAGNOSIS — G8929 Other chronic pain: Secondary | ICD-10-CM | POA: Diagnosis not present

## 2021-08-01 NOTE — Progress Notes (Signed)
The patient is now about 6 weeks out from a left total hip arthroplasty.  She is 88 years old.  She said the hip is doing well but she still dealing with bilateral shoulder pain.  She does ambulate with a walker.  We have injected her left shoulder.  She is requesting a steroid injection in her right shoulder today.  She is a type II diabetic but under good control.  She is reporting numbness in her left hand since surgery.  Her surgery was spinal anesthesia.  On exam she is able to make a fist with her left hand that is numb.  She has a palpable pulse radial and ulnar.  Her hand is well-perfused.  The numbness seems to be global.  This certainly could be coming from her cervical spine.  She does report remote surgery on her right shoulder.  She hurts at the [facility] joint and anteriorly.  She likely has rotator cuff arthropathy based on my exam.  I did feel reasonable to place a steroid injection in her right subacromial outlet of the shoulder which she tolerated well.  Her left operative hip does move smoothly and fluidly.  At this point I will see her back in 3 months.  We will have a standing AP pelvis and lateral of her left hip at that visit.  Hopefully her neurologic symptoms in her left hand will improve with time as well.

## 2021-08-02 ENCOUNTER — Ambulatory Visit (INDEPENDENT_AMBULATORY_CARE_PROVIDER_SITE_OTHER): Payer: Medicare Other

## 2021-08-02 ENCOUNTER — Ambulatory Visit (INDEPENDENT_AMBULATORY_CARE_PROVIDER_SITE_OTHER): Payer: Medicare Other | Admitting: Internal Medicine

## 2021-08-02 ENCOUNTER — Other Ambulatory Visit: Payer: Self-pay

## 2021-08-02 ENCOUNTER — Encounter: Payer: Self-pay | Admitting: Internal Medicine

## 2021-08-02 VITALS — BP 124/70 | HR 96 | Temp 98.3°F

## 2021-08-02 DIAGNOSIS — R0789 Other chest pain: Secondary | ICD-10-CM

## 2021-08-02 DIAGNOSIS — Z9889 Other specified postprocedural states: Secondary | ICD-10-CM | POA: Diagnosis not present

## 2021-08-02 DIAGNOSIS — I700 Atherosclerosis of aorta: Secondary | ICD-10-CM | POA: Diagnosis not present

## 2021-08-02 NOTE — Progress Notes (Signed)
Acute office Visit     This visit occurred during the SARS-CoV-2 public health emergency.  Safety protocols were in place, including screening questions prior to the visit, additional usage of staff PPE, and extensive cleaning of exam room while observing appropriate contact time as indicated for disinfecting solutions.    CC/Reason for Visit: Follow-up chest wall pain  HPI: [name] is a 88 y.o. female who is coming in today for the above mentioned reasons.  She was just discharged from rehab 5 days ago following a 6-week stay after a left total hip replacement.  2 days after being at home she fell forward hitting the center of her chest with her recliner.  This was 3 days ago.  She has been having pain in the center of her chest ever since.  Worse with movement and deep breathing.  She does not feel short of breath.  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 , HER2/neu negative-- s/p right lumpectomy (without SLNB) and radiation therapy   Chronic lower back pain    DDD (degenerative disc disease)    lumbar   Diverticulosis    Family history of breast cancer    Family history of pancreatic cancer    Family history of prostate cancer    Flaccid neuropathic bladder, not elsewhere classified    Foley catheter in place    GERD (gastroesophageal reflux disease)    Hemorrhoids    Hiatal hernia    History of colon polyps    History of radiation therapy 09-12-2015 to 10-12-2015   42.72 gray in 16 fractions directed right breast w/ boost of 12 gray in 6 fractions directed at the lumpectomy cavity- Total dose: 54.74y   History of recurrent UTIs    Hyperlipidemia    RBBB (right bundle branch block with left anterior fascicular  block)    Type 2 diabetes mellitus ([place])    Urinary retention with incomplete bladder emptying    Wears dentures    full upper and lower partial   Wears glasses    Wears hearing aid    bilateral but does not wear    Past Surgical History:  Procedure Laterality Date   BACK SURGERY     BREAST LUMPECTOMY WITH RADIOACTIVE SEED LOCALIZATION Right 08/03/2015   Procedure: BREAST LUMPECTOMY WITH RADIOACTIVE SEED LOCALIZATION;  Surgeon: [name], MD;  Location: [place];  Service: General;  Laterality: Right;   BREAST SURGERY     CARDIOVASCULAR STRESS TEST  01/14/2012   Low risk nuclear study w/ small mild apical and apical lateral reversible perfusion defect represents a small area of ischemia versus shifting breast artifact/  normal LV funciton and wall motion, ef 86%   CARPAL TUNNEL RELEASE Right 1977   CATARACT EXTRACTION W/ INTRAOCULAR LENS  IMPLANT, BILATERAL Bilateral left 1996/  right 1997   CLOSED RIGHT KNEE MANIPULATION  08/11/2002   post TKA   CYSTOSTOMY N/A 05/17/2016   Procedure: CYSTOSCOPY WITH  SUPRAPUBIC PLACMENT;  Surgeon: [name], MD;  Location: [facility];  Service: Urology;  Laterality: N/A;   EYE SURGERY     GANGLION CYST EXCISION Right 12/09/2001   right palm   HERNIA REPAIR  JOINT REPLACEMENT     KNEE ARTHROSCOPY Right 12/14/2002   w/ Lysis Adhesions   LAMINECTOMY  04/24/2020   Bilateral redo L2-3, L3-4 and L4-5 laminotomy/laminectomy/foraminotomies/medial facetectomy to decompress the bilateral L3, L4 and L5 nerve roots   LUMBAR LAMINECTOMY/DECOMPRESSION MICRODISCECTOMY N/A 03/06/2016   Procedure: CENTRAL DECOMPRESSION L3 - L4 ,L4 - L5;  Surgeon: [name], MD;  Location: [facility];  Service: Orthopedics;  Laterality: N/A;   SHOULDER HEMI-ARTHROPLASTY Right 02/20/2009   avascular necrosis    TOTAL HIP ARTHROPLASTY Left 06/22/2021   Procedure: LEFT TOTAL HIP ARTHROPLASTY ANTERIOR APPROACH;  Surgeon: Mcarthur Rossetti, MD;  Location: [facility];  Service: Orthopedics;  Laterality: Left;   TOTAL KNEE ARTHROPLASTY Bilateral right 06-23-2002/  left [phone]   UMBILICAL HERNIA REPAIR  1958   VAGINAL HYSTERECTOMY  1975    Social History:  reports that she has never smoked. She has never used smokeless tobacco. She reports that she does not drink alcohol and does not use drugs.  Allergies: Allergies  Allergen Reactions   Phenergan [Promethazine]     Slurred speech, acted very confused   Remeron [Mirtazapine] Itching    Family History:  Family History  Problem Relation Age of Onset   Pancreatic cancer Mother 75   Diabetes Mother    Prostate cancer Brother    Diabetes Father    Heart disease Father    Diabetes Brother    Heart attack Brother    Diabetes Sister    Diabetes Daughter    Diabetes Son    Coronary artery disease Brother        MI in his 24s   Colon cancer Neg Hx    Stomach cancer Neg Hx      Current Outpatient Medications:    acetaminophen (TYLENOL) 650 MG CR tablet, Take 650 mg by mouth every 8 (eight) hours as needed for pain., Disp: , Rfl:    ALPRAZolam (XANAX) 1 MG tablet, Take 0.5-1 tablets (0.5-1 mg total) by mouth at bedtime as needed for anxiety. Avoid regular use., Disp: 20 tablet, Rfl: 0   aspirin 81 MG chewable tablet, Chew 1 tablet (81 mg total) by mouth 2 (two) times daily., Disp: 35 tablet, Rfl: 0   atorvastatin (LIPITOR) 10 MG tablet, TAKE 1 TABLET DAILY, Disp: 90 tablet, Rfl: 1   cholecalciferol (VITAMIN D3) 25 MCG (1000 UNIT) tablet, Take 1,000 Units by mouth daily., Disp: , Rfl:    cyanocobalamin 1000 MCG tablet, Take 1,000 mcg by mouth at bedtime. , Disp: , Rfl:    docusate sodium (COLACE) 100 MG capsule, Take 1 capsule (100 mg total) by mouth 2 (two) times daily. (Patient taking differently: Take 100 mg by mouth daily.), Disp: 60 capsule, Rfl: 0   esomeprazole (NEXIUM) 40 MG capsule, TAKE 1 CAPSULE DAILY, Disp: 90 capsule, Rfl: 1   furosemide (LASIX)  40 MG tablet, TAKE 1 TABLET DAILY OR AS DIRECTED, Disp: 90 tablet, Rfl: 1   glipiZIDE (GLUCOTROL) 10 MG tablet, TAKE 1 TABLET IN THE MORNING, Disp: 90 tablet, Rfl: 1   MOVANTIK 25 MG TABS tablet, TAKE 1 TABLET(25 MG) BY MOUTH DAILY, Disp: 30 tablet, Rfl: 1   Trospium Chloride 60 MG CP24, Take 60 mg by mouth daily., Disp: , Rfl:    colchicine 0.6 MG tablet, Take 1 tablet (0.6 mg total) by mouth 2 (two) times daily. For probable gout, stop atorvastatin while  on this medication (Patient not taking: Reported on 08/02/2021), Disp: 30 tablet, Rfl: 1  HYDROcodone-acetaminophen (NORCO/VICODIN) 5-325 MG tablet, Take 1-2 tablets by mouth every 4 (four) hours as needed for moderate pain (pain score 4-6). (Patient not taking: Reported on 08/02/2021), Disp: 30 tablet, Rfl: 0   methocarbamol (ROBAXIN) 500 MG tablet, Take 1 tablet (500 mg total) by mouth every 6 (six) hours as needed for muscle spasms. (Patient not taking: Reported on 08/02/2021), Disp: 40 tablet, Rfl: 1   senna-docusate (SENOKOT-S) 8.6-50 MG tablet, Take 2 tablets by mouth 2 (two) times daily. (Patient not taking: Reported on 08/02/2021), Disp: 120 tablet, Rfl: 2  Review of Systems:  Constitutional: Denies fever, chills, diaphoresis, appetite change and fatigue.  HEENT: Denies photophobia, eye pain, redness, hearing loss, ear pain, congestion, sore throat, rhinorrhea, sneezing, mouth sores, trouble swallowing, neck pain, neck stiffness and tinnitus.   Respiratory: Denies SOB, DOE, cough, chest tightness,  and wheezing.   Cardiovascular: Denies , palpitations and leg swelling.  Gastrointestinal: Denies nausea, vomiting, abdominal pain, diarrhea, constipation, blood in stool and abdominal distention.  Genitourinary: Denies dysuria, urgency, frequency, hematuria, flank pain and difficulty urinating.  Endocrine: Denies: hot or cold intolerance, sweats, changes in hair or nails, polyuria, polydipsia. Musculoskeletal: Denies myalgias, back pain, joint  swelling, arthralgias and gait problem.  Skin: Denies pallor, rash and wound.  Neurological: Denies dizziness, seizures, syncope, weakness, light-headedness, numbness and headaches.  Hematological: Denies adenopathy. Easy bruising, personal or family bleeding history  Psychiatric/Behavioral: Denies suicidal ideation, mood changes, confusion, nervousness, sleep disturbance and agitation    Physical Exam: Vitals:   08/02/21 1400  BP: 124/70  Pulse: 96  Temp: 98.3 F (36.8 C)  TempSrc: Oral  SpO2: 94%    There is no height or weight on file to calculate BMI.   Constitutional: NAD, calm, comfortable, in wheelchair Eyes: PERRL, lids and conjunctivae normal, wears corrective lenses ENMT: Mucous membranes are moist.  Respiratory: clear to auscultation bilaterally, no wheezing, no crackles. Normal respiratory effort. No accessory muscle use.  Cardiovascular: Regular rate and rhythm, no murmurs / rubs / gallops.  Pain to palpation of anterior chest wall. Abdomen: no tenderness, no masses palpated. No hepatosplenomegaly. Bowel sounds positive.  Psychiatric: Normal judgment and insight. Alert and oriented x 3. Normal mood.    Impression and Plan:  Chest wall pain  - Plan: DG Ribs Bilateral -Suspect either rib fracture or more likely deep tissue bruising, muscular pain. -We have discussed use of heating pads, as needed NSAIDs, she has muscle relaxers at home. -We have discussed deep breathing exercises to prevent atelectasis and pneumonia.  Time spent: 22 minutes reviewing chart, interviewing and examining patient and formulating plan of care.     Lelon Frohlich, MD [facility]

## 2021-08-03 DIAGNOSIS — E119 Type 2 diabetes mellitus without complications: Secondary | ICD-10-CM | POA: Diagnosis not present

## 2021-08-03 DIAGNOSIS — Z96652 Presence of left artificial knee joint: Secondary | ICD-10-CM | POA: Diagnosis not present

## 2021-08-03 DIAGNOSIS — Z471 Aftercare following joint replacement surgery: Secondary | ICD-10-CM | POA: Diagnosis not present

## 2021-08-03 DIAGNOSIS — G8929 Other chronic pain: Secondary | ICD-10-CM | POA: Diagnosis not present

## 2021-08-03 DIAGNOSIS — L89620 Pressure ulcer of left heel, unstageable: Secondary | ICD-10-CM | POA: Diagnosis not present

## 2021-08-03 DIAGNOSIS — I10 Essential (primary) hypertension: Secondary | ICD-10-CM | POA: Diagnosis not present

## 2021-08-06 DIAGNOSIS — Z471 Aftercare following joint replacement surgery: Secondary | ICD-10-CM | POA: Diagnosis not present

## 2021-08-06 DIAGNOSIS — Z96652 Presence of left artificial knee joint: Secondary | ICD-10-CM | POA: Diagnosis not present

## 2021-08-06 DIAGNOSIS — E119 Type 2 diabetes mellitus without complications: Secondary | ICD-10-CM | POA: Diagnosis not present

## 2021-08-06 DIAGNOSIS — L89620 Pressure ulcer of left heel, unstageable: Secondary | ICD-10-CM | POA: Diagnosis not present

## 2021-08-06 DIAGNOSIS — I10 Essential (primary) hypertension: Secondary | ICD-10-CM | POA: Diagnosis not present

## 2021-08-06 DIAGNOSIS — G8929 Other chronic pain: Secondary | ICD-10-CM | POA: Diagnosis not present

## 2021-08-07 ENCOUNTER — Telehealth: Payer: Self-pay | Admitting: Internal Medicine

## 2021-08-07 NOTE — Telephone Encounter (Signed)
[place] PT with [facility] wants Dr.Panosh to know when they went to see her for her admissions visit on 1/22, patient does have small unstagable sore on left heel. Patient daughter stated that it is getting better because they're using something provided from prior home agency.  [facility] could be contacted at [phone].  Please advise.

## 2021-08-07 NOTE — Telephone Encounter (Signed)
Noted   Can offer  would asessment by nursing if not getting getter

## 2021-08-08 DIAGNOSIS — Z96652 Presence of left artificial knee joint: Secondary | ICD-10-CM | POA: Diagnosis not present

## 2021-08-08 DIAGNOSIS — E119 Type 2 diabetes mellitus without complications: Secondary | ICD-10-CM | POA: Diagnosis not present

## 2021-08-08 DIAGNOSIS — Z471 Aftercare following joint replacement surgery: Secondary | ICD-10-CM | POA: Diagnosis not present

## 2021-08-08 DIAGNOSIS — G8929 Other chronic pain: Secondary | ICD-10-CM | POA: Diagnosis not present

## 2021-08-08 DIAGNOSIS — I10 Essential (primary) hypertension: Secondary | ICD-10-CM | POA: Diagnosis not present

## 2021-08-08 DIAGNOSIS — L89620 Pressure ulcer of left heel, unstageable: Secondary | ICD-10-CM | POA: Diagnosis not present

## 2021-08-08 NOTE — Telephone Encounter (Signed)
I spoke with Kelford and informed her of message below. Darnelle stated that the pt is currently wearing a pressure boot at night to help with sore. She reported that the wound is not that large and is progressively getting better. Darnelle will reach out to our office if the sore is not getting better.

## 2021-08-13 DIAGNOSIS — E119 Type 2 diabetes mellitus without complications: Secondary | ICD-10-CM | POA: Diagnosis not present

## 2021-08-13 DIAGNOSIS — G8929 Other chronic pain: Secondary | ICD-10-CM | POA: Diagnosis not present

## 2021-08-13 DIAGNOSIS — R338 Other retention of urine: Secondary | ICD-10-CM | POA: Diagnosis not present

## 2021-08-13 DIAGNOSIS — I10 Essential (primary) hypertension: Secondary | ICD-10-CM | POA: Diagnosis not present

## 2021-08-13 DIAGNOSIS — Z96652 Presence of left artificial knee joint: Secondary | ICD-10-CM | POA: Diagnosis not present

## 2021-08-13 DIAGNOSIS — Z471 Aftercare following joint replacement surgery: Secondary | ICD-10-CM | POA: Diagnosis not present

## 2021-08-13 DIAGNOSIS — L89620 Pressure ulcer of left heel, unstageable: Secondary | ICD-10-CM | POA: Diagnosis not present

## 2021-08-15 ENCOUNTER — Encounter: Payer: Self-pay | Admitting: Internal Medicine

## 2021-08-15 DIAGNOSIS — G8929 Other chronic pain: Secondary | ICD-10-CM | POA: Diagnosis not present

## 2021-08-15 DIAGNOSIS — E119 Type 2 diabetes mellitus without complications: Secondary | ICD-10-CM | POA: Diagnosis not present

## 2021-08-15 DIAGNOSIS — Z471 Aftercare following joint replacement surgery: Secondary | ICD-10-CM | POA: Diagnosis not present

## 2021-08-15 DIAGNOSIS — I10 Essential (primary) hypertension: Secondary | ICD-10-CM | POA: Diagnosis not present

## 2021-08-15 DIAGNOSIS — L89620 Pressure ulcer of left heel, unstageable: Secondary | ICD-10-CM | POA: Diagnosis not present

## 2021-08-15 DIAGNOSIS — Z96652 Presence of left artificial knee joint: Secondary | ICD-10-CM | POA: Diagnosis not present

## 2021-08-20 ENCOUNTER — Other Ambulatory Visit: Payer: Self-pay | Admitting: Internal Medicine

## 2021-08-20 DIAGNOSIS — G8929 Other chronic pain: Secondary | ICD-10-CM | POA: Diagnosis not present

## 2021-08-20 DIAGNOSIS — L89620 Pressure ulcer of left heel, unstageable: Secondary | ICD-10-CM | POA: Diagnosis not present

## 2021-08-20 DIAGNOSIS — E119 Type 2 diabetes mellitus without complications: Secondary | ICD-10-CM | POA: Diagnosis not present

## 2021-08-20 DIAGNOSIS — Z471 Aftercare following joint replacement surgery: Secondary | ICD-10-CM | POA: Diagnosis not present

## 2021-08-20 DIAGNOSIS — I10 Essential (primary) hypertension: Secondary | ICD-10-CM | POA: Diagnosis not present

## 2021-08-20 DIAGNOSIS — Z96652 Presence of left artificial knee joint: Secondary | ICD-10-CM | POA: Diagnosis not present

## 2021-08-22 ENCOUNTER — Encounter: Payer: Self-pay | Admitting: Internal Medicine

## 2021-08-22 ENCOUNTER — Ambulatory Visit (INDEPENDENT_AMBULATORY_CARE_PROVIDER_SITE_OTHER): Payer: Medicare Other | Admitting: Internal Medicine

## 2021-08-22 VITALS — BP 108/50 | HR 87 | Temp 98.3°F | Ht 62.9 in | Wt 137.2 lb

## 2021-08-22 DIAGNOSIS — Z9889 Other specified postprocedural states: Secondary | ICD-10-CM | POA: Diagnosis not present

## 2021-08-22 DIAGNOSIS — E1169 Type 2 diabetes mellitus with other specified complication: Secondary | ICD-10-CM | POA: Diagnosis not present

## 2021-08-22 DIAGNOSIS — E7800 Pure hypercholesterolemia, unspecified: Secondary | ICD-10-CM

## 2021-08-22 DIAGNOSIS — N1832 Chronic kidney disease, stage 3b: Secondary | ICD-10-CM | POA: Diagnosis not present

## 2021-08-22 DIAGNOSIS — M1990 Unspecified osteoarthritis, unspecified site: Secondary | ICD-10-CM

## 2021-08-22 DIAGNOSIS — R609 Edema, unspecified: Secondary | ICD-10-CM

## 2021-08-22 DIAGNOSIS — G479 Sleep disorder, unspecified: Secondary | ICD-10-CM

## 2021-08-22 DIAGNOSIS — Z79899 Other long term (current) drug therapy: Secondary | ICD-10-CM

## 2021-08-22 DIAGNOSIS — R000 Tachycardia, unspecified: Secondary | ICD-10-CM | POA: Diagnosis not present

## 2021-08-22 NOTE — Patient Instructions (Signed)
Good to see  you today  Check blood sugars  anemia thyroid etc.  Ok to take acetaminophen at night  . Caution with alprazolam   medication  ( for anxiety )  risk of fall and mental fogginess.   Ok to send refill for metoprolol to me for rx  but  would  have you  send andy info about type of rapid heart beat   that was diagnosed.   Plan  continue foot care . Fu in 3 months or as indicated .

## 2021-08-22 NOTE — Progress Notes (Signed)
° °Chief Complaint  °Patient presents with  ° Follow-up  ° ° °HPI: °[name] 88 y.o. come in for fu post hopsital sp hip replacement    with a number   of FU issues  ° °Hip surgery dec 16 had transfusion in hospital still tired ., no bleeding  °Daughter  helping   to be at   her home [place].    Since got out of 4 weeks rehab at  °[place]  need 24 help.  Weak and not self care .  ° °BG:  Eating a lot of sweets. Not checking  Bg ° °Falling  when home  see last notes none since last see  - chest wall pain using ibuprofen  °Physical herapy  2 x per week now to once a week.  [facility]  surgeon  has fu  ° °Had heart racing   when in rehab and given  med  ? Dx  still on metoprolol.   25.  Er asks if we can do refill  no low bp  problems  denies hypotension  ° °Sleep still and issue taking 0.5 alpraz at night and still can awaken and take tylenol  poss anxiety stress  ° °Feet hurt  °Had swelling at rehabe better on meds  °Had heel   pressusre area getting better  topical  protection °ROS: See pertinent positives and negatives per HPI.has bladder cath no cp sob  ° °Past Medical History:  °Diagnosis Date  ° Arthritis   ° Bilateral edema of lower extremity   ° Breast cancer of upper-outer quadrant of right female breast (HCC) dx 07/19/2015--- oncologist-  dr gudena/ dr kinard  ° DCIS,  grade 3, 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 , HER2/neu negative-- s/p right lumpectomy (without SLNB) and radiation therapy  ° Chronic lower back pain   ° DDD (degenerative disc disease)   ° lumbar  ° Diverticulosis   ° Family history of breast cancer   ° Family history of pancreatic cancer   ° Family history of prostate cancer   ° Flaccid neuropathic bladder, not elsewhere classified   ° Foley catheter in place   ° GERD (gastroesophageal reflux disease)   ° Hemorrhoids   ° Hiatal hernia   ° History of colon polyps   ° History of radiation therapy 09-12-2015 to 10-12-2015  ° 42.72 gray in 16 fractions directed  right breast w/ boost of 12 gray in 6 fractions directed at the lumpectomy cavity- Total dose: 54.72y  ° History of recurrent UTIs   ° Hyperlipidemia   ° RBBB (right bundle branch block with left anterior fascicular block)   ° Type 2 diabetes mellitus (HCC)   ° Urinary retention with incomplete bladder emptying   ° Wears dentures   ° full upper and lower partial  ° Wears glasses   ° Wears hearing aid   ° bilateral but does not wear  ° ° °Family History  °Problem Relation Age of Onset  ° Pancreatic cancer Mother 59  ° Diabetes Mother   ° Prostate cancer Brother   ° Diabetes Father   ° Heart disease Father   ° Diabetes Brother   ° Heart attack Brother   ° Diabetes Sister   ° Diabetes Daughter   ° Diabetes Son   ° Coronary artery disease Brother   °     MI in his 50s  ° Colon cancer Neg Hx   ° Stomach cancer Neg Hx   ° ° °Social   MI in his 56s   Colon cancer Neg Hx    Stomach cancer Neg Hx     Social History   Socioeconomic History   Marital status: Widowed    Spouse name: Sibel Khurana   Number of children: 4   Years of education: 12   Highest education level: 12th grade  Occupational History   Occupation: Retired  Tobacco Use   Smoking status: Never   Smokeless tobacco: Never  Vaping Use   Vaping Use: Never used  Substance and Sexual Activity   Alcohol use: No   Drug use: No   Sexual activity: Never  Other Topics Concern   Not on file  Social History Narrative   HH 2 married   Works for [employer] for 40 years retired   No pets    Lives w spouse; married in 87; 78 years    Social Determinants of Radio broadcast assistant Strain: Low Risk    Difficulty of Paying Living Expenses: Not hard at all  Food Insecurity: No Food Insecurity   Worried About Charity fundraiser in the Last Year: Never true   Arboriculturist in the Last Year: Never true  Transportation Needs: No Transportation Needs   Lack of Transportation (Medical): No   Lack of Transportation (Non-Medical): No  Physical Activity: Inactive   Days of Exercise per Week: 0 days   Minutes of  Exercise per Session: 0 min  Stress: Not on file  Social Connections: Moderately Isolated   Frequency of Communication with Friends and Family: More than three times a week   Frequency of Social Gatherings with Friends and Family: More than three times a week   Attends Religious Services: Never   Marine scientist or Organizations: No   Attends Archivist Meetings: Never   Marital Status: Married    Outpatient Medications Prior to Visit  Medication Sig Dispense Refill   acetaminophen (TYLENOL) 650 MG CR tablet Take 650 mg by mouth every 8 (eight) hours as needed for pain.     ALPRAZolam (XANAX) 1 MG tablet Take 0.5-1 tablets (0.5-1 mg total) by mouth at bedtime as needed for anxiety. Avoid regular use. 20 tablet 0   aspirin 81 MG chewable tablet Chew 1 tablet (81 mg total) by mouth 2 (two) times daily. 35 tablet 0   atorvastatin (LIPITOR) 10 MG tablet TAKE 1 TABLET DAILY 90 tablet 1   cholecalciferol (VITAMIN D3) 25 MCG (1000 UNIT) tablet Take 1,000 Units by mouth daily.     cyanocobalamin 1000 MCG tablet Take 1,000 mcg by mouth at bedtime.      docusate sodium (COLACE) 100 MG capsule Take 1 capsule (100 mg total) by mouth 2 (two) times daily. (Patient taking differently: Take 100 mg by mouth daily.) 60 capsule 0   esomeprazole (NEXIUM) 40 MG capsule TAKE 1 CAPSULE DAILY 90 capsule 1   furosemide (LASIX) 40 MG tablet TAKE 1 TABLET DAILY OR AS DIRECTED 90 tablet 1   glipiZIDE (GLUCOTROL) 10 MG tablet TAKE 1 TABLET IN THE MORNING 90 tablet 1   methocarbamol (ROBAXIN) 500 MG tablet Take 1 tablet (500 mg total) by mouth every 6 (six) hours as needed for muscle spasms. 40 tablet 1   MOVANTIK 25 MG TABS tablet TAKE 1 TABLET(25 MG) BY MOUTH DAILY 30 tablet 1   senna-docusate (SENOKOT-S) 8.6-50 MG tablet Take 2 tablets by mouth 2 (two) times daily. 120 tablet 2   Trospium Chloride 60 MG  mouth 2 (two) times daily. For probable gout, stop atorvastatin while  on this medication (Patient not taking: Reported on 08/22/2021) 30 tablet 1  ° HYDROcodone-acetaminophen (NORCO/VICODIN) 5-325 MG tablet Take 1-2 tablets by mouth every 4 (four) hours as needed for moderate pain (pain score 4-6). (Patient not taking: Reported on 08/22/2021) 30 tablet 0  ° metoprolol succinate (TOPROL-XL) 25 MG 24 hr tablet Take 25 mg by mouth daily.    ° °No facility-administered medications prior to visit.  ° ° ° °EXAM: ° °BP (!) 108/50 (BP Location: Left Arm, Patient Position: Sitting, Cuff Size: Normal)    Pulse 87    Temp 98.3 °F (36.8 °C) (Oral)    Ht 5' 2.9" (1.598 m)    Wt 137 lb 3.2 oz (62.2 kg)    SpO2 97%    BMI 24.38 kg/m²  ° °Body mass index is 24.38 kg/m². ° °GENERAL: vitals reviewed and listed above, alert, oriented, appears well hydrated and in no acute distress in wc with  daughters  °HEENT: atraumatic, conjunctiva  clear, no obvious abnormalities on inspection of external nose and ears OP : masked  °NECK: no obvious masses on inspection palpation  °LUNGS: clear to auscultation bilaterally, no wheezes, rales or rhonchi, good air movement °CV: HRRR, no clubbing cyanosis1+  peripheral edema   compression socks nl cap refill  °MS: moves all extremities without noticeable focal  abnormality  toe abrasion left heeling feet  scaly  no open area back of heel some flat calous  °PSYCH: pleasant and cooperative, no obvious depression or anxiety °Lab Results  °Component Value Date  ° WBC 5.8 06/25/2021  ° HGB 9.3 (L) 06/25/2021  ° HCT 28.7 (L) 06/25/2021  ° PLT 206 06/25/2021  ° GLUCOSE 160 (H) 06/23/2021  ° CHOL 180 08/31/2019  ° TRIG 323.0 (H) 08/31/2019  ° HDL 35.80 (L) 08/31/2019  ° LDLDIRECT 81.0 08/31/2019  ° LDLCALC 83 12/16/2013  ° ALT 8 08/14/2020  ° AST 12 08/14/2020  ° NA 130 (L) 06/23/2021  ° K 4.3 06/23/2021  ° CL 96 (L) 06/23/2021  ° CREATININE 1.43 (H) 06/23/2021  ° BUN 31 (H) 06/23/2021  ° CO2 27 06/23/2021  °  TSH 4.48 08/14/2020  ° INR 1.0 12/25/2019  ° HGBA1C 7.1 (H) 06/18/2021  ° MICROALBUR 10.3 (H) 08/31/2019  ° °BP Readings from Last 3 Encounters:  °08/22/21 (!) 108/50  °08/02/21 124/70  °06/25/21 (!) 107/53  ° ° °ASSESSMENT AND PLAN: ° °Discussed the following assessment and plan: ° °Type 2 diabetes mellitus with other specified complication, without long-term current use of insulin (HCC) - Plan: Basic metabolic panel, CBC with Differential/Platelet, Hepatic function panel, Hemoglobin A1c, Lipid panel, TSH, TSH, Lipid panel, Hemoglobin A1c, Hepatic function panel, CBC with Differential/Platelet, Basic metabolic panel, Microalbumin / creatinine urine ratio, Microalbumin / creatinine urine ratio ° °Osteoarthritis, unspecified osteoarthritis type, unspecified site - Plan: Basic metabolic panel, CBC with Differential/Platelet, Hepatic function panel, Hemoglobin A1c, Lipid panel, TSH, TSH, Lipid panel, Hemoglobin A1c, Hepatic function panel, CBC with Differential/Platelet, Basic metabolic panel ° °Sleep difficulties ° °Pure hypercholesterolemia - Plan: Basic metabolic panel, CBC with Differential/Platelet, Hepatic function panel, Hemoglobin A1c, Lipid panel, TSH, TSH, Lipid panel, Hemoglobin A1c, Hepatic function panel, CBC with Differential/Platelet, Basic metabolic panel ° °Medication management - Plan: Basic metabolic panel, CBC with Differential/Platelet, Hepatic function panel, Hemoglobin A1c, Lipid panel, TSH, TSH, Lipid panel, Hemoglobin A1c, Hepatic function panel, CBC with Differential/Platelet, Basic metabolic panel ° °Sleep disturbance - Plan: Basic metabolic panel, CBC   TSH, Lipid panel, Hemoglobin A1c, Hepatic function panel, CBC with Differential/Platelet, Basic metabolic panel  Sleep disturbance - Plan: Basic metabolic panel, CBC with Differential/Platelet, Hepatic function panel, Hemoglobin A1c, Lipid panel, TSH, TSH, Lipid panel, Hemoglobin A1c, Hepatic function panel, CBC with Differential/Platelet, Basic metabolic panel  Peripheral edema - Plan: Basic metabolic panel, CBC with Differential/Platelet, Hepatic function panel, Hemoglobin A1c, Lipid panel, TSH, TSH,  Lipid panel, Hemoglobin A1c, Hepatic function panel, CBC with Differential/Platelet, Basic metabolic panel  Tachycardia hx of  - on metorolol er 25 in rehab   Stage 3b chronic kidney disease ([place])  Status post surgery Ongoing sleep problems with anxiety risk-benefit previous medicines were not helpful we will continue her 0.5 alprazolam for now and add Tylenol before goes to bed. May known also my reluctance to continue benzo diazepam but previous sleep medicines in the past were totally ineffective according to past trials.  And did not think the benefit of daily SSRIs etc. outweigh the risks.  She is currently not on chronic pain medicines which is helpful. Also caution with taking ibuprofen long-term to reduce risk of kidney dysfunction. -Patient advised to return or notify health care team  if  new concerns arise. Record review evaluation plan medication evaluation 45 minutes Patient Instructions  Good to see  you today  Check blood sugars  anemia thyroid etc.  Ok to take acetaminophen at night  . Caution with alprazolam   medication  ( for anxiety )  risk of fall and mental fogginess.   Ok to send refill for metoprolol to me for rx  but  would  have you  send andy info about type of rapid heart beat   that was diagnosed.   Plan  continue foot care . Fu in 3 months or as indicated .    Standley Brooking. Jesiel Garate M.D.

## 2021-08-23 LAB — MICROALBUMIN / CREATININE URINE RATIO
Creatinine,U: 53.8 mg/dL
Microalb Creat Ratio: 1.8 mg/g (ref 0.0–30.0)
Microalb, Ur: 1 mg/dL (ref 0.0–1.9)

## 2021-08-23 LAB — BASIC METABOLIC PANEL
BUN: 43 mg/dL — ABNORMAL HIGH (ref 6–23)
CO2: 34 mEq/L — ABNORMAL HIGH (ref 19–32)
Calcium: 9.5 mg/dL (ref 8.4–10.5)
Chloride: 99 mEq/L (ref 96–112)
Creatinine, Ser: 1.63 mg/dL — ABNORMAL HIGH (ref 0.40–1.20)
GFR: 27.79 mL/min — ABNORMAL LOW (ref >60.00)
Glucose, Bld: 75 mg/dL (ref 70–99)
Potassium: 4.5 mEq/L (ref 3.5–5.1)
Sodium: 135 mEq/L (ref 135–145)

## 2021-08-23 LAB — HEPATIC FUNCTION PANEL
ALT: 7 U/L (ref 0–35)
AST: 12 U/L (ref 0–37)
Albumin: 4 g/dL (ref 3.5–5.2)
Alkaline Phosphatase: 98 U/L (ref 39–117)
Bilirubin, Direct: 0.1 mg/dL (ref 0.0–0.3)
Total Bilirubin: 0.4 mg/dL (ref 0.2–1.2)
Total Protein: 7.1 g/dL (ref 6.0–8.3)

## 2021-08-23 LAB — CBC WITH DIFFERENTIAL/PLATELET
Basophils Absolute: 0.1 10*3/uL (ref 0.0–0.1)
Basophils Relative: 1.2 % (ref 0.0–3.0)
Eosinophils Absolute: 0.2 10*3/uL (ref 0.0–0.7)
Eosinophils Relative: 4.4 % (ref 0.0–5.0)
HCT: 34.1 % — ABNORMAL LOW (ref 36.0–46.0)
Hemoglobin: 10.9 g/dL — ABNORMAL LOW (ref 12.0–15.0)
Lymphocytes Relative: 35.6 % (ref 12.0–46.0)
Lymphs Abs: 2 10*3/uL (ref 0.7–4.0)
MCHC: 32 g/dL (ref 30.0–36.0)
MCV: 87.6 fl (ref 78.0–100.0)
Monocytes Absolute: 0.6 10*3/uL (ref 0.1–1.0)
Monocytes Relative: 10.8 % (ref 3.0–12.0)
Neutro Abs: 2.7 10*3/uL (ref 1.4–7.7)
Neutrophils Relative %: 48 % (ref 43.0–77.0)
Platelets: 271 10*3/uL (ref 150.0–400.0)
RBC: 3.89 Mil/uL (ref 3.87–5.11)
RDW: 15.7 % — ABNORMAL HIGH (ref 11.5–15.5)
WBC: 5.6 10*3/uL (ref 4.0–10.5)

## 2021-08-23 LAB — LIPID PANEL
Cholesterol: 148 mg/dL (ref 0–200)
HDL: 44.9 mg/dL (ref >39.00)
NonHDL: 103.1
Total CHOL/HDL Ratio: 3
Triglycerides: 221 mg/dL — ABNORMAL HIGH (ref 0.0–149.0)
VLDL: 44.2 mg/dL — ABNORMAL HIGH (ref 0.0–40.0)

## 2021-08-23 LAB — LDL CHOLESTEROL, DIRECT: Direct LDL: 66 mg/dL

## 2021-08-23 LAB — TSH: TSH: 4.65 u[IU]/mL (ref 0.35–5.50)

## 2021-08-23 LAB — HEMOGLOBIN A1C: Hgb A1c MFr Bld: 6.1 % (ref 4.6–6.5)

## 2021-08-26 NOTE — Progress Notes (Signed)
Diabetes is controlled  a1c is 6.1 thyroid. Kidney function is slightly decrease. e will repeat at your follow up  in May

## 2021-08-28 DIAGNOSIS — M545 Low back pain, unspecified: Secondary | ICD-10-CM | POA: Diagnosis not present

## 2021-08-28 DIAGNOSIS — Z466 Encounter for fitting and adjustment of urinary device: Secondary | ICD-10-CM | POA: Diagnosis not present

## 2021-08-28 DIAGNOSIS — I10 Essential (primary) hypertension: Secondary | ICD-10-CM | POA: Diagnosis not present

## 2021-08-28 DIAGNOSIS — I452 Bifascicular block: Secondary | ICD-10-CM | POA: Diagnosis not present

## 2021-08-28 DIAGNOSIS — K5900 Constipation, unspecified: Secondary | ICD-10-CM | POA: Diagnosis not present

## 2021-08-28 DIAGNOSIS — Z7984 Long term (current) use of oral hypoglycemic drugs: Secondary | ICD-10-CM | POA: Diagnosis not present

## 2021-08-28 DIAGNOSIS — K219 Gastro-esophageal reflux disease without esophagitis: Secondary | ICD-10-CM | POA: Diagnosis not present

## 2021-08-28 DIAGNOSIS — E119 Type 2 diabetes mellitus without complications: Secondary | ICD-10-CM | POA: Diagnosis not present

## 2021-08-28 DIAGNOSIS — M1990 Unspecified osteoarthritis, unspecified site: Secondary | ICD-10-CM | POA: Diagnosis not present

## 2021-08-28 DIAGNOSIS — G8929 Other chronic pain: Secondary | ICD-10-CM | POA: Diagnosis not present

## 2021-08-28 DIAGNOSIS — Z471 Aftercare following joint replacement surgery: Secondary | ICD-10-CM | POA: Diagnosis not present

## 2021-08-28 DIAGNOSIS — L89620 Pressure ulcer of left heel, unstageable: Secondary | ICD-10-CM | POA: Diagnosis not present

## 2021-08-28 DIAGNOSIS — R339 Retention of urine, unspecified: Secondary | ICD-10-CM | POA: Diagnosis not present

## 2021-08-28 DIAGNOSIS — Z96652 Presence of left artificial knee joint: Secondary | ICD-10-CM | POA: Diagnosis not present

## 2021-08-28 DIAGNOSIS — Z7982 Long term (current) use of aspirin: Secondary | ICD-10-CM | POA: Diagnosis not present

## 2021-08-28 DIAGNOSIS — C50411 Malignant neoplasm of upper-outer quadrant of right female breast: Secondary | ICD-10-CM | POA: Diagnosis not present

## 2021-08-28 DIAGNOSIS — M519 Unspecified thoracic, thoracolumbar and lumbosacral intervertebral disc disorder: Secondary | ICD-10-CM | POA: Diagnosis not present

## 2021-08-28 DIAGNOSIS — Z79891 Long term (current) use of opiate analgesic: Secondary | ICD-10-CM | POA: Diagnosis not present

## 2021-08-28 DIAGNOSIS — Z9350 Unspecified cystostomy status: Secondary | ICD-10-CM | POA: Diagnosis not present

## 2021-08-28 DIAGNOSIS — N312 Flaccid neuropathic bladder, not elsewhere classified: Secondary | ICD-10-CM | POA: Diagnosis not present

## 2021-08-28 DIAGNOSIS — E785 Hyperlipidemia, unspecified: Secondary | ICD-10-CM | POA: Diagnosis not present

## 2021-08-29 ENCOUNTER — Encounter: Payer: Self-pay | Admitting: Internal Medicine

## 2021-08-31 DIAGNOSIS — E119 Type 2 diabetes mellitus without complications: Secondary | ICD-10-CM | POA: Diagnosis not present

## 2021-08-31 DIAGNOSIS — I10 Essential (primary) hypertension: Secondary | ICD-10-CM | POA: Diagnosis not present

## 2021-08-31 DIAGNOSIS — Z471 Aftercare following joint replacement surgery: Secondary | ICD-10-CM | POA: Diagnosis not present

## 2021-08-31 DIAGNOSIS — L89620 Pressure ulcer of left heel, unstageable: Secondary | ICD-10-CM | POA: Diagnosis not present

## 2021-08-31 DIAGNOSIS — Z96652 Presence of left artificial knee joint: Secondary | ICD-10-CM | POA: Diagnosis not present

## 2021-08-31 DIAGNOSIS — G8929 Other chronic pain: Secondary | ICD-10-CM | POA: Diagnosis not present

## 2021-09-03 DIAGNOSIS — L89620 Pressure ulcer of left heel, unstageable: Secondary | ICD-10-CM | POA: Diagnosis not present

## 2021-09-03 DIAGNOSIS — Z96652 Presence of left artificial knee joint: Secondary | ICD-10-CM | POA: Diagnosis not present

## 2021-09-03 DIAGNOSIS — I10 Essential (primary) hypertension: Secondary | ICD-10-CM | POA: Diagnosis not present

## 2021-09-03 DIAGNOSIS — Z471 Aftercare following joint replacement surgery: Secondary | ICD-10-CM | POA: Diagnosis not present

## 2021-09-03 DIAGNOSIS — G8929 Other chronic pain: Secondary | ICD-10-CM | POA: Diagnosis not present

## 2021-09-03 DIAGNOSIS — E119 Type 2 diabetes mellitus without complications: Secondary | ICD-10-CM | POA: Diagnosis not present

## 2021-09-03 NOTE — Telephone Encounter (Signed)
Thanks for trying Please contact [name] [facility] to get any records related to a fast heart rate EKG.  Etc.  Reason continuity of care We do not have any records from [facility] and I do not need all of the records just the ones related to tachycardia and medication.  thanks

## 2021-09-05 NOTE — Telephone Encounter (Signed)
I spoke with [name] with [facility] and she is faxing medical records to our office.  ?

## 2021-09-06 NOTE — Telephone Encounter (Signed)
EKG shows sinus  tachycardia rhytym  rate 115 RBBB  ? ?NO alarming  findings ( ie no A fib or  pvcs )   ?No change.   ?Ok to stay on the metoprolol as long as pulse  stays  above 58 range  and not dizzy  with LOW bp  100 and below .  ? ?Thanks   keep fu in 3 months  ?

## 2021-09-07 ENCOUNTER — Encounter: Payer: Self-pay | Admitting: Internal Medicine

## 2021-09-10 DIAGNOSIS — N312 Flaccid neuropathic bladder, not elsewhere classified: Secondary | ICD-10-CM | POA: Diagnosis not present

## 2021-09-10 DIAGNOSIS — R338 Other retention of urine: Secondary | ICD-10-CM | POA: Diagnosis not present

## 2021-09-10 MED ORDER — METOPROLOL SUCCINATE ER 25 MG PO TB24: 25.0000 mg | ORAL_TABLET | Freq: Every day | ORAL | 1 refills | Status: DC

## 2021-09-10 NOTE — Telephone Encounter (Signed)
YEs please send in for 90 days metoprolol  refill x 1   ( the ekg showed just sinus rhythm and no  serious arrythmia)

## 2021-09-12 DIAGNOSIS — I10 Essential (primary) hypertension: Secondary | ICD-10-CM | POA: Diagnosis not present

## 2021-09-12 DIAGNOSIS — Z96652 Presence of left artificial knee joint: Secondary | ICD-10-CM | POA: Diagnosis not present

## 2021-09-12 DIAGNOSIS — E119 Type 2 diabetes mellitus without complications: Secondary | ICD-10-CM | POA: Diagnosis not present

## 2021-09-12 DIAGNOSIS — L89620 Pressure ulcer of left heel, unstageable: Secondary | ICD-10-CM | POA: Diagnosis not present

## 2021-09-12 DIAGNOSIS — G8929 Other chronic pain: Secondary | ICD-10-CM | POA: Diagnosis not present

## 2021-09-12 DIAGNOSIS — Z471 Aftercare following joint replacement surgery: Secondary | ICD-10-CM | POA: Diagnosis not present

## 2021-09-18 ENCOUNTER — Inpatient Hospital Stay: Payer: Medicare Other | Admitting: Adult Health

## 2021-09-19 DIAGNOSIS — Z96652 Presence of left artificial knee joint: Secondary | ICD-10-CM | POA: Diagnosis not present

## 2021-09-19 DIAGNOSIS — Z471 Aftercare following joint replacement surgery: Secondary | ICD-10-CM | POA: Diagnosis not present

## 2021-09-19 DIAGNOSIS — E119 Type 2 diabetes mellitus without complications: Secondary | ICD-10-CM | POA: Diagnosis not present

## 2021-09-19 DIAGNOSIS — L89620 Pressure ulcer of left heel, unstageable: Secondary | ICD-10-CM | POA: Diagnosis not present

## 2021-09-19 DIAGNOSIS — I10 Essential (primary) hypertension: Secondary | ICD-10-CM | POA: Diagnosis not present

## 2021-09-19 DIAGNOSIS — G8929 Other chronic pain: Secondary | ICD-10-CM | POA: Diagnosis not present

## 2021-09-24 DIAGNOSIS — L89620 Pressure ulcer of left heel, unstageable: Secondary | ICD-10-CM | POA: Diagnosis not present

## 2021-09-24 DIAGNOSIS — G8929 Other chronic pain: Secondary | ICD-10-CM | POA: Diagnosis not present

## 2021-09-24 DIAGNOSIS — Z471 Aftercare following joint replacement surgery: Secondary | ICD-10-CM | POA: Diagnosis not present

## 2021-09-24 DIAGNOSIS — Z96652 Presence of left artificial knee joint: Secondary | ICD-10-CM | POA: Diagnosis not present

## 2021-09-24 DIAGNOSIS — E119 Type 2 diabetes mellitus without complications: Secondary | ICD-10-CM | POA: Diagnosis not present

## 2021-09-24 DIAGNOSIS — I10 Essential (primary) hypertension: Secondary | ICD-10-CM | POA: Diagnosis not present

## 2021-09-25 ENCOUNTER — Encounter: Payer: Self-pay | Admitting: Internal Medicine

## 2021-09-25 DIAGNOSIS — M7989 Other specified soft tissue disorders: Secondary | ICD-10-CM

## 2021-09-26 ENCOUNTER — Telehealth: Payer: Self-pay | Admitting: Orthopaedic Surgery

## 2021-09-26 MED ORDER — COLCHICINE 0.6 MG PO TABS: 0.6000 mg | ORAL_TABLET | Freq: Two times a day (BID) | ORAL | 1 refills | Status: DC

## 2021-09-26 NOTE — Telephone Encounter (Signed)
Rx sent to the pharmacy.

## 2021-09-26 NOTE — Telephone Encounter (Signed)
Called and gave verbal ok 

## 2021-09-26 NOTE — Telephone Encounter (Signed)
Claiborne Billings (PT) from [facility] called requesting extended verbal orders for pt home health for 2 wk 2, and 1 wk 2. Please call Claiborne Billings back on her secure line at [phone]. If unable to answer leave detailed message. ?

## 2021-09-27 DIAGNOSIS — E785 Hyperlipidemia, unspecified: Secondary | ICD-10-CM | POA: Diagnosis not present

## 2021-09-27 DIAGNOSIS — Z79891 Long term (current) use of opiate analgesic: Secondary | ICD-10-CM | POA: Diagnosis not present

## 2021-09-27 DIAGNOSIS — I452 Bifascicular block: Secondary | ICD-10-CM | POA: Diagnosis not present

## 2021-09-27 DIAGNOSIS — N312 Flaccid neuropathic bladder, not elsewhere classified: Secondary | ICD-10-CM | POA: Diagnosis not present

## 2021-09-27 DIAGNOSIS — G8929 Other chronic pain: Secondary | ICD-10-CM | POA: Diagnosis not present

## 2021-09-27 DIAGNOSIS — I10 Essential (primary) hypertension: Secondary | ICD-10-CM | POA: Diagnosis not present

## 2021-09-27 DIAGNOSIS — M1990 Unspecified osteoarthritis, unspecified site: Secondary | ICD-10-CM | POA: Diagnosis not present

## 2021-09-27 DIAGNOSIS — Z7982 Long term (current) use of aspirin: Secondary | ICD-10-CM | POA: Diagnosis not present

## 2021-09-27 DIAGNOSIS — R339 Retention of urine, unspecified: Secondary | ICD-10-CM | POA: Diagnosis not present

## 2021-09-27 DIAGNOSIS — Z7984 Long term (current) use of oral hypoglycemic drugs: Secondary | ICD-10-CM | POA: Diagnosis not present

## 2021-09-27 DIAGNOSIS — M519 Unspecified thoracic, thoracolumbar and lumbosacral intervertebral disc disorder: Secondary | ICD-10-CM | POA: Diagnosis not present

## 2021-09-27 DIAGNOSIS — Z471 Aftercare following joint replacement surgery: Secondary | ICD-10-CM | POA: Diagnosis not present

## 2021-09-27 DIAGNOSIS — M545 Low back pain, unspecified: Secondary | ICD-10-CM | POA: Diagnosis not present

## 2021-09-27 DIAGNOSIS — Z9350 Unspecified cystostomy status: Secondary | ICD-10-CM | POA: Diagnosis not present

## 2021-09-27 DIAGNOSIS — K5900 Constipation, unspecified: Secondary | ICD-10-CM | POA: Diagnosis not present

## 2021-09-27 DIAGNOSIS — C50411 Malignant neoplasm of upper-outer quadrant of right female breast: Secondary | ICD-10-CM | POA: Diagnosis not present

## 2021-09-27 DIAGNOSIS — Z96642 Presence of left artificial hip joint: Secondary | ICD-10-CM | POA: Diagnosis not present

## 2021-09-27 DIAGNOSIS — E119 Type 2 diabetes mellitus without complications: Secondary | ICD-10-CM | POA: Diagnosis not present

## 2021-09-27 DIAGNOSIS — K219 Gastro-esophageal reflux disease without esophagitis: Secondary | ICD-10-CM | POA: Diagnosis not present

## 2021-10-01 DIAGNOSIS — Z471 Aftercare following joint replacement surgery: Secondary | ICD-10-CM | POA: Diagnosis not present

## 2021-10-01 DIAGNOSIS — M1990 Unspecified osteoarthritis, unspecified site: Secondary | ICD-10-CM | POA: Diagnosis not present

## 2021-10-01 DIAGNOSIS — G8929 Other chronic pain: Secondary | ICD-10-CM | POA: Diagnosis not present

## 2021-10-01 DIAGNOSIS — E119 Type 2 diabetes mellitus without complications: Secondary | ICD-10-CM | POA: Diagnosis not present

## 2021-10-01 DIAGNOSIS — I10 Essential (primary) hypertension: Secondary | ICD-10-CM | POA: Diagnosis not present

## 2021-10-01 DIAGNOSIS — Z96642 Presence of left artificial hip joint: Secondary | ICD-10-CM | POA: Diagnosis not present

## 2021-10-03 DIAGNOSIS — E119 Type 2 diabetes mellitus without complications: Secondary | ICD-10-CM | POA: Diagnosis not present

## 2021-10-03 DIAGNOSIS — M1990 Unspecified osteoarthritis, unspecified site: Secondary | ICD-10-CM | POA: Diagnosis not present

## 2021-10-03 DIAGNOSIS — G8929 Other chronic pain: Secondary | ICD-10-CM | POA: Diagnosis not present

## 2021-10-03 DIAGNOSIS — Z471 Aftercare following joint replacement surgery: Secondary | ICD-10-CM | POA: Diagnosis not present

## 2021-10-03 DIAGNOSIS — Z96642 Presence of left artificial hip joint: Secondary | ICD-10-CM | POA: Diagnosis not present

## 2021-10-03 DIAGNOSIS — I10 Essential (primary) hypertension: Secondary | ICD-10-CM | POA: Diagnosis not present

## 2021-10-05 DIAGNOSIS — Z1231 Encounter for screening mammogram for malignant neoplasm of breast: Secondary | ICD-10-CM | POA: Diagnosis not present

## 2021-10-05 LAB — HM MAMMOGRAPHY

## 2021-10-08 ENCOUNTER — Encounter: Payer: Self-pay | Admitting: Internal Medicine

## 2021-10-08 DIAGNOSIS — M1990 Unspecified osteoarthritis, unspecified site: Secondary | ICD-10-CM | POA: Diagnosis not present

## 2021-10-08 DIAGNOSIS — G8929 Other chronic pain: Secondary | ICD-10-CM | POA: Diagnosis not present

## 2021-10-08 DIAGNOSIS — R338 Other retention of urine: Secondary | ICD-10-CM | POA: Diagnosis not present

## 2021-10-08 DIAGNOSIS — I10 Essential (primary) hypertension: Secondary | ICD-10-CM | POA: Diagnosis not present

## 2021-10-08 DIAGNOSIS — Z471 Aftercare following joint replacement surgery: Secondary | ICD-10-CM | POA: Diagnosis not present

## 2021-10-08 DIAGNOSIS — E119 Type 2 diabetes mellitus without complications: Secondary | ICD-10-CM | POA: Diagnosis not present

## 2021-10-08 DIAGNOSIS — Z96642 Presence of left artificial hip joint: Secondary | ICD-10-CM | POA: Diagnosis not present

## 2021-10-10 DIAGNOSIS — E119 Type 2 diabetes mellitus without complications: Secondary | ICD-10-CM | POA: Diagnosis not present

## 2021-10-10 DIAGNOSIS — I10 Essential (primary) hypertension: Secondary | ICD-10-CM | POA: Diagnosis not present

## 2021-10-10 DIAGNOSIS — Z96642 Presence of left artificial hip joint: Secondary | ICD-10-CM | POA: Diagnosis not present

## 2021-10-10 DIAGNOSIS — G8929 Other chronic pain: Secondary | ICD-10-CM | POA: Diagnosis not present

## 2021-10-10 DIAGNOSIS — M1990 Unspecified osteoarthritis, unspecified site: Secondary | ICD-10-CM | POA: Diagnosis not present

## 2021-10-10 DIAGNOSIS — Z471 Aftercare following joint replacement surgery: Secondary | ICD-10-CM | POA: Diagnosis not present

## 2021-10-11 DIAGNOSIS — R928 Other abnormal and inconclusive findings on diagnostic imaging of breast: Secondary | ICD-10-CM | POA: Diagnosis not present

## 2021-10-11 DIAGNOSIS — R922 Inconclusive mammogram: Secondary | ICD-10-CM | POA: Diagnosis not present

## 2021-10-11 LAB — HM MAMMOGRAPHY

## 2021-10-15 DIAGNOSIS — Z96642 Presence of left artificial hip joint: Secondary | ICD-10-CM | POA: Diagnosis not present

## 2021-10-15 DIAGNOSIS — M1990 Unspecified osteoarthritis, unspecified site: Secondary | ICD-10-CM | POA: Diagnosis not present

## 2021-10-15 DIAGNOSIS — I10 Essential (primary) hypertension: Secondary | ICD-10-CM | POA: Diagnosis not present

## 2021-10-15 DIAGNOSIS — Z471 Aftercare following joint replacement surgery: Secondary | ICD-10-CM | POA: Diagnosis not present

## 2021-10-15 DIAGNOSIS — G8929 Other chronic pain: Secondary | ICD-10-CM | POA: Diagnosis not present

## 2021-10-15 DIAGNOSIS — E119 Type 2 diabetes mellitus without complications: Secondary | ICD-10-CM | POA: Diagnosis not present

## 2021-10-16 ENCOUNTER — Other Ambulatory Visit: Payer: Self-pay | Admitting: Internal Medicine

## 2021-10-16 DIAGNOSIS — E119 Type 2 diabetes mellitus without complications: Secondary | ICD-10-CM

## 2021-10-18 ENCOUNTER — Encounter: Payer: Self-pay | Admitting: Internal Medicine

## 2021-10-22 DIAGNOSIS — Z96642 Presence of left artificial hip joint: Secondary | ICD-10-CM | POA: Diagnosis not present

## 2021-10-22 DIAGNOSIS — I10 Essential (primary) hypertension: Secondary | ICD-10-CM | POA: Diagnosis not present

## 2021-10-22 DIAGNOSIS — Z471 Aftercare following joint replacement surgery: Secondary | ICD-10-CM | POA: Diagnosis not present

## 2021-10-22 DIAGNOSIS — E119 Type 2 diabetes mellitus without complications: Secondary | ICD-10-CM | POA: Diagnosis not present

## 2021-10-22 DIAGNOSIS — M1990 Unspecified osteoarthritis, unspecified site: Secondary | ICD-10-CM | POA: Diagnosis not present

## 2021-10-22 DIAGNOSIS — G8929 Other chronic pain: Secondary | ICD-10-CM | POA: Diagnosis not present

## 2021-10-29 ENCOUNTER — Telehealth: Payer: Self-pay | Admitting: Orthopaedic Surgery

## 2021-10-29 NOTE — Telephone Encounter (Signed)
Called and advised that this was ok, added to appt notes  ?

## 2021-10-29 NOTE — Telephone Encounter (Signed)
Patient called. She would like to know if she could have her shoulder looked at on 4/26 as well. Her call back number is [phone] ?

## 2021-10-31 ENCOUNTER — Ambulatory Visit (INDEPENDENT_AMBULATORY_CARE_PROVIDER_SITE_OTHER): Payer: Medicare Other | Admitting: Orthopaedic Surgery

## 2021-10-31 ENCOUNTER — Ambulatory Visit (INDEPENDENT_AMBULATORY_CARE_PROVIDER_SITE_OTHER): Payer: Medicare Other

## 2021-10-31 DIAGNOSIS — M25511 Pain in right shoulder: Secondary | ICD-10-CM | POA: Diagnosis not present

## 2021-10-31 DIAGNOSIS — Z96642 Presence of left artificial hip joint: Secondary | ICD-10-CM

## 2021-10-31 DIAGNOSIS — G8929 Other chronic pain: Secondary | ICD-10-CM

## 2021-10-31 NOTE — Progress Notes (Signed)
The patient is a 88 year old female who is several months out from a left total hip arthroplasty.  She is walking with a walker and she is dealing with right shoulder pain.  However she does have a history of a right shoulder arthroplasty that was done at another group in town.  She reports decreased motion and strength of the right shoulder.  The left hip is doing well in terms of her hip replacement. ? ?The left hip moves smoothly and fluidly with no complicating features.  There is still some numbness at the incision to be expected.  I can put her shoulder through some gentle rotation on the right side and is located.  Deeper x-rays of the pelvis and left hip show well-seated total hip arthroplasty.  The right shoulder has a shoulder arthroplasty that is slightly high riding glenoid.  There is AC joint arthritic changes. ? ?From a shoulder standpoint I recommend that she go back to the orthopedic practice that performed her shoulder surgery.  If she desires not to go by that practice we can see if Dr. Marlou Sa can see her.  From a hip standpoint she is doing well from the hip so follow-up for the hip can be as needed.  All question concerns were answered and addressed. ?

## 2021-11-05 ENCOUNTER — Other Ambulatory Visit: Payer: Self-pay | Admitting: Internal Medicine

## 2021-11-05 DIAGNOSIS — R338 Other retention of urine: Secondary | ICD-10-CM | POA: Diagnosis not present

## 2021-11-06 ENCOUNTER — Telehealth: Payer: Self-pay | Admitting: Internal Medicine

## 2021-11-06 NOTE — Telephone Encounter (Signed)
Pt can be reached at [phone]    ?[id number] [place], [place] Phone:  [phone]  ?Fax:  [phone]  ?  ? ?

## 2021-11-06 NOTE — Telephone Encounter (Signed)
Pt requesting medication for an ear ache, sore throat x 1 week. Offered ov, declined. Has appointment with an ENT but appointment is in 2 weeks ?

## 2021-11-06 NOTE — Telephone Encounter (Signed)
Last Ov 08/22/21 ?Please advise  ?

## 2021-11-07 DIAGNOSIS — H6091 Unspecified otitis externa, right ear: Secondary | ICD-10-CM | POA: Diagnosis not present

## 2021-11-07 DIAGNOSIS — H6690 Otitis media, unspecified, unspecified ear: Secondary | ICD-10-CM | POA: Diagnosis not present

## 2021-11-07 DIAGNOSIS — H9201 Otalgia, right ear: Secondary | ICD-10-CM | POA: Diagnosis not present

## 2021-11-08 ENCOUNTER — Telehealth: Payer: Self-pay | Admitting: Internal Medicine

## 2021-11-08 NOTE — Telephone Encounter (Signed)
Unusual request to treat without appt . ? ?Cannot  evaluate or treat without seeing her   ?

## 2021-11-08 NOTE — Telephone Encounter (Signed)
Spoke with patient regarding message and she stated she was seen at [facility] in [place] and prescribed Rx  and staying at her daughter's house.  patient stated she has an upcoming appt 5/15. ?

## 2021-11-08 NOTE — Telephone Encounter (Signed)
Patient was returning call to office.  Call her back at [phone]. ?

## 2021-11-08 NOTE — Telephone Encounter (Signed)
Left voicemail for patient to call the office 

## 2021-11-16 DIAGNOSIS — H6691 Otitis media, unspecified, right ear: Secondary | ICD-10-CM | POA: Diagnosis not present

## 2021-11-19 ENCOUNTER — Encounter: Payer: Self-pay | Admitting: Internal Medicine

## 2021-11-19 ENCOUNTER — Ambulatory Visit (INDEPENDENT_AMBULATORY_CARE_PROVIDER_SITE_OTHER): Payer: Medicare Other | Admitting: Internal Medicine

## 2021-11-19 VITALS — BP 108/60 | HR 91 | Temp 98.4°F | Ht 62.0 in | Wt 137.4 lb

## 2021-11-19 DIAGNOSIS — Z79899 Other long term (current) drug therapy: Secondary | ICD-10-CM | POA: Diagnosis not present

## 2021-11-19 DIAGNOSIS — R6884 Jaw pain: Secondary | ICD-10-CM | POA: Diagnosis not present

## 2021-11-19 DIAGNOSIS — G479 Sleep disorder, unspecified: Secondary | ICD-10-CM

## 2021-11-19 DIAGNOSIS — N1832 Chronic kidney disease, stage 3b: Secondary | ICD-10-CM

## 2021-11-19 DIAGNOSIS — D649 Anemia, unspecified: Secondary | ICD-10-CM | POA: Diagnosis not present

## 2021-11-19 DIAGNOSIS — G8929 Other chronic pain: Secondary | ICD-10-CM

## 2021-11-19 DIAGNOSIS — M25511 Pain in right shoulder: Secondary | ICD-10-CM

## 2021-11-19 DIAGNOSIS — T887XXA Unspecified adverse effect of drug or medicament, initial encounter: Secondary | ICD-10-CM | POA: Diagnosis not present

## 2021-11-19 DIAGNOSIS — Z9989 Dependence on other enabling machines and devices: Secondary | ICD-10-CM

## 2021-11-19 DIAGNOSIS — H9201 Otalgia, right ear: Secondary | ICD-10-CM

## 2021-11-19 DIAGNOSIS — E1169 Type 2 diabetes mellitus with other specified complication: Secondary | ICD-10-CM

## 2021-11-19 LAB — CBC WITH DIFFERENTIAL/PLATELET
Basophils Absolute: 0.1 10*3/uL (ref 0.0–0.1)
Basophils Relative: 1.3 % (ref 0.0–3.0)
Eosinophils Absolute: 0.1 10*3/uL (ref 0.0–0.7)
Eosinophils Relative: 1.9 % (ref 0.0–5.0)
HCT: 31 % — ABNORMAL LOW (ref 36.0–46.0)
Hemoglobin: 10.7 g/dL — ABNORMAL LOW (ref 12.0–15.0)
Lymphocytes Relative: 27.5 % (ref 12.0–46.0)
Lymphs Abs: 1.4 10*3/uL (ref 0.7–4.0)
MCHC: 34.4 g/dL (ref 30.0–36.0)
MCV: 82.1 fl (ref 78.0–100.0)
Monocytes Absolute: 0.7 10*3/uL (ref 0.1–1.0)
Monocytes Relative: 13 % — ABNORMAL HIGH (ref 3.0–12.0)
Neutro Abs: 3 10*3/uL (ref 1.4–7.7)
Neutrophils Relative %: 56.3 % (ref 43.0–77.0)
Platelets: 339 10*3/uL (ref 150.0–400.0)
RBC: 3.78 Mil/uL — ABNORMAL LOW (ref 3.87–5.11)
RDW: 15.6 % — ABNORMAL HIGH (ref 11.5–15.5)
WBC: 5.3 10*3/uL (ref 4.0–10.5)

## 2021-11-19 LAB — SEDIMENTATION RATE: Sed Rate: 69 mm/hr — ABNORMAL HIGH (ref 0–30)

## 2021-11-19 MED ORDER — ALPRAZOLAM 1 MG PO TABS: 0.5000 mg | ORAL_TABLET | Freq: Every evening | ORAL | 0 refills | Status: DC | PRN

## 2021-11-19 NOTE — Progress Notes (Signed)
? ?Chief Complaint  ?Patient presents with  ? Follow-up  ?  Seasonal allergies   ? ? ?HPI: ?[name] 88 y.o. come in for Chronic disease management and number of newer problems ?May have allergies sneezing fits does not want to take a medicine daughter bought some generic Claritin is it okay to take. ?Right ear pain complaining for 2 weeks that is constant.  Sometimes worse with touching the area and radiates down into the neck jawline. ?No associated fever sore throat respiratory symptoms.  Went to dentist did x-rays tooth infection not the cause ?Was seen in urgent care twice for ear pain thyroid treated for ear infection the first time with amoxicillin and went back and was given Augmentin twice daily.  Developed diarrhea and after 3 days stopped the Augmentin.  None of this appears to make a major change in her ear pain. ?Is tender around the right jaw joint no trauma no falling ? ?Also problem with ear pain  ? ? ?ROS: See pertinent positives and negatives per HPI.  Right shoulder pain is been a problem past history of surgery not eligible for shots is bothersome.  Using a walker for mobilization does not feel steady or competent to use just a cane.  This is since her recent surgery.  Right ankle still tender to touch some swelling. ?States that her sleep is still not good enough takes half a Xanax and is able to sleep a few hours but not enough can wake up with pain and often will take ibuprofen in the middle of the night.  No active bleeding. ? ?Past Medical History:  ?Diagnosis Date  ? Arthritis   ? Bilateral edema of lower extremity   ? Breast cancer of upper-outer quadrant of right female breast [facility]) dx 07/19/2015--- oncologist-  dr Lindi Adie dr Sondra Come  ? 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 Chronic lower back pain   ? DDD (degenerative disc disease)   ? lumbar  ? Diverticulosis   ? Family history of breast  cancer   ? Family history of pancreatic cancer   ? Family history of prostate cancer   ? Flaccid neuropathic bladder, not elsewhere classified   ? Foley catheter in place   ? GERD (gastroesophageal reflux disease)   ? Hemorrhoids   ? Hiatal hernia   ? History of colon polyps   ? History of radiation therapy 09-12-2015 to 10-12-2015  ? 42.72 gray in 16 fractions directed right breast w/ boost of 12 gray in 6 fractions directed at the lumpectomy cavity- Total dose: 54.72y  ? History of recurrent UTIs   ? Hyperlipidemia   ? RBBB (right bundle branch block with left anterior fascicular block)   ? Type 2 diabetes mellitus ([place])   ? Urinary retention with incomplete bladder emptying   ? Wears dentures   ? full upper and lower partial  ? Wears glasses   ? Wears hearing aid   ? bilateral but does not wear  ? ? ?Family History  ?Problem Relation Age of Onset  ? Pancreatic cancer Mother 40  ? Diabetes Mother   ? Prostate cancer Brother   ? Diabetes Father   ? Heart disease Father   ? Diabetes Brother   ? Heart attack Brother   ? Diabetes Sister   ? Diabetes Daughter   ? Diabetes Son   ? Coronary artery disease Brother   ?  MI in his 11s  ? Colon cancer Neg Hx   ? Stomach cancer Neg Hx   ? ? ?Social History  ? ?Socioeconomic History  ? Marital status: Widowed  ?  Spouse name: Kinesha Auten  ? Number of children: 4  ? Years of education: 67  ? Highest education level: 12th grade  ?Occupational History  ? Occupation: Retired  ?Tobacco Use  ? Smoking status: Never  ? Smokeless tobacco: Never  ?Vaping Use  ? Vaping Use: Never used  ?Substance and Sexual Activity  ? Alcohol use: No  ? Drug use: No  ? Sexual activity: Never  ?Other Topics Concern  ? Not on file  ?Social History Narrative  ? [place] 2 married  ? Works for [employer] for 40 years retired  ? No pets   ? Lives w spouse; married in 47; 55 years   ? ?Social Determinants of Health  ? ?Financial Resource Strain: Not on file  ?Food Insecurity: Not on file  ?Transportation  Needs: Not on file  ?Physical Activity: Inactive  ? Days of Exercise per Week: 0 days  ? Minutes of Exercise per Session: 0 min  ?Stress: Not on file  ?Social Connections: Moderately Isolated  ? Frequency of Communication with Friends and Family: More than three times a week  ? Frequency of Social Gatherings with Friends and Family: More than three times a week  ? Attends Religious Services: Never  ? Active Member of Clubs or Organizations: No  ? Attends Archivist Meetings: Never  ? Marital Status: Married  ? ? ?Outpatient Medications Prior to Visit  ?Medication Sig Dispense Refill  ? acetaminophen (TYLENOL) 650 MG CR tablet Take 650 mg by mouth every 8 (eight) hours as needed for pain.    ? aspirin 81 MG chewable tablet Chew 1 tablet (81 mg total) by mouth 2 (two) times daily. 35 tablet 0  ? cholecalciferol (VITAMIN D3) 25 MCG (1000 UNIT) tablet Take 1,000 Units by mouth daily.    ? colchicine 0.6 MG tablet Take 1 tablet (0.6 mg total) by mouth 2 (two) times daily. For probable gout, stop atorvastatin while  on this medication 30 tablet 1  ? cyanocobalamin 1000 MCG tablet Take 1,000 mcg by mouth at bedtime.     ? docusate sodium (COLACE) 100 MG capsule Take 1 capsule (100 mg total) by mouth 2 (two) times daily. (Patient taking differently: Take 100 mg by mouth daily.) 60 capsule 0  ? esomeprazole (NEXIUM) 40 MG capsule TAKE 1 CAPSULE DAILY 90 capsule 1  ? glipiZIDE (GLUCOTROL) 10 MG tablet TAKE 1 TABLET IN THE MORNING 90 tablet 1  ? HYDROcodone-acetaminophen (NORCO/VICODIN) 5-325 MG tablet Take 1-2 tablets by mouth every 4 (four) hours as needed for moderate pain (pain score 4-6). 30 tablet 0  ? Trospium Chloride 60 MG CP24 Take 60 mg by mouth daily.    ? ALPRAZolam (XANAX) 1 MG tablet Take 0.5-1 tablets (0.5-1 mg total) by mouth at bedtime as needed for anxiety. Avoid regular use. 20 tablet 0  ? atorvastatin (LIPITOR) 10 MG tablet TAKE 1 TABLET DAILY (Patient not taking: Reported on 11/19/2021) 90 tablet  3  ? furosemide (LASIX) 40 MG tablet TAKE 1 TABLET DAILY OR AS DIRECTED (Patient not taking: Reported on 11/19/2021) 90 tablet 1  ? metoprolol succinate (TOPROL-XL) 25 MG 24 hr tablet Take 1 tablet (25 mg total) by mouth daily. (Patient not taking: Reported on 11/19/2021) 90 tablet 1  ? MOVANTIK 25 MG TABS  tablet TAKE 1 TABLET(25 MG) BY MOUTH DAILY (Patient not taking: Reported on 11/19/2021) 30 tablet 1  ? senna-docusate (SENOKOT-S) 8.6-50 MG tablet Take 2 tablets by mouth 2 (two) times daily. (Patient not taking: Reported on 11/19/2021) 120 tablet 2  ? methocarbamol (ROBAXIN) 500 MG tablet Take 1 tablet (500 mg total) by mouth every 6 (six) hours as needed for muscle spasms. (Patient not taking: Reported on 11/19/2021) 40 tablet 1  ? ?No facility-administered medications prior to visit.  ? ? ? ?EXAM: ? ?BP 108/60 (BP Location: Left Arm, Patient Position: Sitting, Cuff Size: Normal)   Pulse 91   Temp 98.4 ?F (36.9 ?C) (Oral)   Ht 5' 2"  (1.575 m)   Wt 137 lb 6.4 oz (62.3 kg)   SpO2 97%   BMI 25.13 kg/m?  ? ?Body mass index is 25.13 kg/m?. ? ?GENERAL: vitals reviewed and listed above, alert, oriented, appears well hydrated and in no acute distress has hearing aids  ?HEENT: atraumatic, conjunctiva  clear, no obvious abnormalities on inspection of external nose and ears points to tmj area of pain and in ext canal   small amount of wax  light color  and no dc  tm partly seen and ok   mouth edentulous on upper teeth  tender along jaw line  and no lesion   no temporal artery tenderness  ?NECK: no obvious masses on inspection palpation  ?LUNGS: clear to auscultation bilaterally, no wheezes, rales or rhonchi, good air movement ?CV: HRRR, no clubbing cyanosis or  peripheral edema nl cap refill  ?Urine catheter in place  ?MS: moves all extremities without noticeable focal  abnormality ?PSYCH: pleasant and cooperative, cognition seems intact  ?Lab Results  ?Component Value Date  ? WBC 5.6 08/22/2021  ? HGB 10.9 (L) 08/22/2021   ? HCT 34.1 (L) 08/22/2021  ? PLT 271.0 08/22/2021  ? GLUCOSE 75 08/22/2021  ? CHOL 148 08/22/2021  ? TRIG 221.0 (H) 08/22/2021  ? HDL 44.90 08/22/2021  ? LDLDIRECT 66.0 08/22/2021  ? [place] 83 12/16/2013

## 2021-11-19 NOTE — Patient Instructions (Addendum)
Try otc claritin  for allergy sx to see ifhelps. ?The pain seems to be related to the jaw  but also possible the jaw nerve .  ?Have ENT decide in more antibiotic appropriate.  ? ?Some times a medication for nerve pain is used. ?Lab today to check inflammation markers   and can send to ENT Dr Burney Gauze team.  ? ?Plan followup depending  ? ?? If gabapentin  may help  .100 mg   Sleep and pain but would wait for further evaluation. First  ?

## 2021-11-20 LAB — BASIC METABOLIC PANEL
BUN: 39 mg/dL — ABNORMAL HIGH (ref 6–23)
CO2: 25 mEq/L (ref 19–32)
Calcium: 9.4 mg/dL (ref 8.4–10.5)
Chloride: 93 mEq/L — ABNORMAL LOW (ref 96–112)
Creatinine, Ser: 1.47 mg/dL — ABNORMAL HIGH (ref 0.40–1.20)
GFR: 31.41 mL/min — ABNORMAL LOW (ref >60.00)
Glucose, Bld: 60 mg/dL — ABNORMAL LOW (ref 70–99)
Potassium: 4.4 mEq/L (ref 3.5–5.1)
Sodium: 129 mEq/L — ABNORMAL LOW (ref 135–145)

## 2021-11-20 LAB — HEMOGLOBIN A1C: Hgb A1c MFr Bld: 6.7 % — ABNORMAL HIGH (ref 4.6–6.5)

## 2021-11-20 LAB — C-REACTIVE PROTEIN: CRP: 1.1 mg/dL (ref 0.5–20.0)

## 2021-11-23 ENCOUNTER — Telehealth: Payer: Self-pay | Admitting: Internal Medicine

## 2021-11-23 DIAGNOSIS — H9201 Otalgia, right ear: Secondary | ICD-10-CM | POA: Diagnosis not present

## 2021-11-23 DIAGNOSIS — H903 Sensorineural hearing loss, bilateral: Secondary | ICD-10-CM | POA: Diagnosis not present

## 2021-11-23 DIAGNOSIS — H6981 Other specified disorders of Eustachian tube, right ear: Secondary | ICD-10-CM | POA: Diagnosis not present

## 2021-11-23 NOTE — Telephone Encounter (Signed)
Left message for patient to call back and schedule Medicare Annual Wellness Visit (AWV) either virtually or in office. Left  my Herbie Drape number [phone]   Last AWV ;11/20/20 please schedule at anytime with [facility] Nurse Health Advisor 1 or 2

## 2021-11-30 ENCOUNTER — Other Ambulatory Visit: Payer: Self-pay | Admitting: Internal Medicine

## 2021-11-30 DIAGNOSIS — M7989 Other specified soft tissue disorders: Secondary | ICD-10-CM

## 2021-12-03 NOTE — Progress Notes (Signed)
blood count showed anemia about the same and  kidney function about the same .   Inflammation markers up some ( not diagnostic)   sodium level  is low again like  6 months ago  can be medication effect.   (If goes low can cause  fogginess and balance  problems ( but not pain). Diabetes in control with a1c 6.7 .    How is she doing  after ear pain evaluation  with ENT?)  advise fu bmp in 1 month  diagnosis is  hyponatremia.  And CKD

## 2021-12-05 ENCOUNTER — Other Ambulatory Visit: Payer: Self-pay

## 2021-12-05 DIAGNOSIS — N1832 Chronic kidney disease, stage 3b: Secondary | ICD-10-CM

## 2021-12-05 DIAGNOSIS — E871 Hypo-osmolality and hyponatremia: Secondary | ICD-10-CM

## 2021-12-10 DIAGNOSIS — R338 Other retention of urine: Secondary | ICD-10-CM | POA: Diagnosis not present

## 2021-12-18 ENCOUNTER — Telehealth: Payer: Self-pay | Admitting: Internal Medicine

## 2021-12-18 NOTE — Telephone Encounter (Signed)
Left message for patient to call back and schedule Medicare Annual Wellness Visit (AWV) either virtually or in office. Left  my [name] number [phone]   Last AWV5/16/22  ; please schedule at anytime with [facility] Nurse Health Advisor 1 or 2

## 2021-12-20 ENCOUNTER — Ambulatory Visit (INDEPENDENT_AMBULATORY_CARE_PROVIDER_SITE_OTHER): Payer: Medicare Other

## 2021-12-20 VITALS — Ht 61.0 in | Wt 137.0 lb

## 2021-12-20 DIAGNOSIS — Z0000 Encounter for general adult medical examination without abnormal findings: Secondary | ICD-10-CM | POA: Diagnosis not present

## 2021-12-20 NOTE — Patient Instructions (Addendum)
[name] , Thank you for taking time to come for your Medicare Wellness Visit. I appreciate your ongoing commitment to your health goals. Please review the following plan we discussed and let me know if I can assist you in the future.   These are the goals we discussed:  Goals       Get stronger (pt-stated)        This is a list of the screening recommended for you and due dates:  Health Maintenance  Topic Date Due   COVID-19 Vaccine (4 - Booster for Moderna series) 01/30/2022*   Zoster (Shingles) Vaccine (1 of 2) 01/30/2022*   Complete foot exam   05/22/2022*   Eye exam for diabetics  05/22/2022*   DEXA scan (bone density measurement)  09/05/2028*   Flu Shot  02/05/2022   Hemoglobin A1C  05/22/2022   Urine Protein Check  08/22/2022   Tetanus Vaccine  01/12/2023   Pneumonia Vaccine  Completed   HPV Vaccine  Aged Out  *Topic was postponed. The date shown is not the original due date.   Opioid Pain Medicine Management Opioids are powerful medicines that are used to treat moderate to severe pain. When used for short periods of time, they can help you to: Sleep better. Do better in physical or occupational therapy. Feel better in the first few days after an injury. Recover from surgery. Opioids should be taken with the supervision of a trained health care provider. They should be taken for the shortest period of time possible. This is because opioids can be addictive, and the longer you take opioids, the greater your risk of addiction. This addiction can also be called opioid use disorder. What are the risks? Using opioid pain medicines for longer than 3 days increases your risk of side effects. Side effects include: Constipation. Nausea and vomiting. Breathing difficulties (respiratory depression). Drowsiness. Confusion. Opioid use disorder. Itching. Taking opioid pain medicine for a long period of time can affect your ability to do daily tasks. It also puts you at risk  for: Motor vehicle crashes. Depression. Suicide. Heart attack. Overdose, which can be life-threatening. What is a pain treatment plan? A pain treatment plan is an agreement between you and your health care provider. Pain is unique to each person, and treatments vary depending on your condition. To manage your pain, you and your health care provider need to work together. To help you do this: Discuss the goals of your treatment, including how much pain you might expect to have and how you will manage the pain. Review the risks and benefits of taking opioid medicines. Remember that a good treatment plan uses more than one approach and minimizes the chance of side effects. Be honest about the amount of medicines you take and about any drug or alcohol use. Get pain medicine prescriptions from only one health care provider. Pain can be managed with many types of alternative treatments. Ask your health care provider to refer you to one or more specialists who can help you manage pain through: Physical or occupational therapy. Counseling (cognitive behavioral therapy). Good nutrition. Biofeedback. Massage. Meditation. Non-opioid medicine. Following a gentle exercise program. How to use opioid pain medicine Taking medicine Take your pain medicine exactly as told by your health care provider. Take it only when you need it. If your pain gets less severe, you may take less than your prescribed dose if your health care provider approves. If you are not having pain, do nottake pain medicine unless your  health care provider tells you to take it. If your pain is severe, do nottry to treat it yourself by taking more pills than instructed on your prescription. Contact your health care provider for help. Write down the times when you take your pain medicine. It is easy to become confused while on pain medicine. Writing the time can help you avoid overdose. Take other over-the-counter or prescription  medicines only as told by your health care provider. Keeping yourself and others safe  While you are taking opioid pain medicine: Do not drive, use machinery, or power tools. Do not sign legal documents. Do not drink alcohol. Do not take sleeping pills. Do not supervise children by yourself. Do not do activities that require climbing or being in high places. Do not go to a lake, river, ocean, spa, or swimming pool. Do not share your pain medicine with anyone. Keep pain medicine in a locked cabinet or in a secure area where pets and children cannot reach it. Stopping your use of opioids If you have been taking opioid medicine for more than a few weeks, you may need to slowly decrease (taper) how much you take until you stop completely. Tapering your use of opioids can decrease your risk of symptoms of withdrawal, such as: Pain and cramping in the abdomen. Nausea. Sweating. Sleepiness. Restlessness. Uncontrollable shaking (tremors). Cravings for the medicine. Do not attempt to taper your use of opioids on your own. Talk with your health care provider about how to do this. Your health care provider may prescribe a step-down schedule based on how much medicine you are taking and how long you have been taking it. Getting rid of leftover pills Do not save any leftover pills. Get rid of leftover pills safely by: Taking the medicine to a prescription take-back program. This is usually offered by the county or law enforcement. Bringing them to a pharmacy that has a drug disposal container. Flushing them down the toilet. Check the label or package insert of your medicine to see whether this is safe to do. Throwing them out in the trash. Check the label or package insert of your medicine to see whether this is safe to do. If it is safe to throw it out, remove the medicine from the original container, put it into a sealable bag or container, and mix it with used coffee grounds, food scraps, dirt, or  cat litter before putting it in the trash. Follow these instructions at home: Activity Do exercises as told by your health care provider. Avoid activities that make your pain worse. Return to your normal activities as told by your health care provider. Ask your health care provider what activities are safe for you. General instructions You may need to take these actions to prevent or treat constipation: Drink enough fluid to keep your urine pale yellow. Take over-the-counter or prescription medicines. Eat foods that are high in fiber, such as beans, whole grains, and fresh fruits and vegetables. Limit foods that are high in fat and processed sugars, such as fried or sweet foods. Keep all follow-up visits. This is important. Where to find support If you have been taking opioids for a long time, you may benefit from receiving support for quitting from a local support group or counselor. Ask your health care provider for a referral to these resources in your area. Where to find more information Centers for Disease Control and Prevention (CDC): http://www.wolf.info/ [place] Food and Drug Administration (FDA): [place] Get help right away if: You  may have taken too much of an opioid (overdosed). Common symptoms of an overdose: Your breathing is slower or more shallow than normal. You have a very slow heartbeat (pulse). You have slurred speech. You have nausea and vomiting. Your pupils become very small. You have other potential symptoms: You are very confused. You faint or feel like you will faint. You have cold, clammy skin. You have blue lips or fingernails. You have thoughts of harming yourself or harming others. These symptoms may represent a serious problem that is an emergency. Do not wait to see if the symptoms will go away. Get medical help right away. Call your local emergency services (911 in the [place] Do not drive yourself to the hospital.  If you ever feel like you may hurt yourself or  others, or have thoughts about taking your own life, get help right away. Go to your nearest emergency department or: Call your local emergency services (911 in the [place] Call the [facility] 815-551-6593 in the [place] Call a suicide crisis helpline, such as the [place] at (272)699-6982 or 988 in the [place]. This is open 24 hours a day in the [place] Text the Crisis Text Line at 484-336-9606 (in the [place].). Summary Opioid medicines can help you manage moderate to severe pain for a short period of time. A pain treatment plan is an agreement between you and your health care provider. Discuss the goals of your treatment, including how much pain you might expect to have and how you will manage the pain. If you think that you or someone else may have taken too much of an opioid, get medical help right away. This information is not intended to replace advice given to you by your health care provider. Make sure you discuss any questions you have with your health care provider. Document Revised: 01/17/2021 Document Reviewed: 10/04/2020 Elsevier Patient Education  [place] directives: Yes  Conditions/risks identified: None  Next appointment: Follow up in one year for your annual wellness visit    Preventive Care 65 Years and Older, Female Preventive care refers to lifestyle choices and visits with your health care provider that can promote health and wellness. What does preventive care include? A yearly physical exam. This is also called an annual well check. Dental exams once or twice a year. Routine eye exams. Ask your health care provider how often you should have your eyes checked. Personal lifestyle choices, including: Daily care of your teeth and gums. Regular physical activity. Eating a healthy diet. Avoiding tobacco and drug use. Limiting alcohol use. Practicing safe sex. Taking low-dose aspirin every day. Taking  vitamin and mineral supplements as recommended by your health care provider. What happens during an annual well check? The services and screenings done by your health care provider during your annual well check will depend on your age, overall health, lifestyle risk factors, and family history of disease. Counseling  Your health care provider may ask you questions about your: Alcohol use. Tobacco use. Drug use. Emotional well-being. Home and relationship well-being. Sexual activity. Eating habits. History of falls. Memory and ability to understand (cognition). Work and work Statistician. Reproductive health. Screening  You may have the following tests or measurements: Height, weight, and BMI. Blood pressure. Lipid and cholesterol levels. These may be checked every 5 years, or more frequently if you are over 69 years old. Skin check. Lung cancer screening. You may have this screening every year starting at  age 4 if you have a 30-pack-year history of smoking and currently smoke or have quit within the past 15 years. Fecal occult blood test (FOBT) of the stool. You may have this test every year starting at age 41. Flexible sigmoidoscopy or colonoscopy. You may have a sigmoidoscopy every 5 years or a colonoscopy every 10 years starting at age 29. Hepatitis C blood test. Hepatitis B blood test. Sexually transmitted disease (STD) testing. Diabetes screening. This is done by checking your blood sugar (glucose) after you have not eaten for a while (fasting). You may have this done every 1-3 years. Bone density scan. This is done to screen for osteoporosis. You may have this done starting at age 74. Mammogram. This may be done every 1-2 years. Talk to your health care provider about how often you should have regular mammograms. Talk with your health care provider about your test results, treatment options, and if necessary, the need for more tests. Vaccines  Your health care provider may  recommend certain vaccines, such as: Influenza vaccine. This is recommended every year. Tetanus, diphtheria, and acellular pertussis (Tdap, Td) vaccine. You may need a Td booster every 10 years. Zoster vaccine. You may need this after age 26. Pneumococcal 13-valent conjugate (PCV13) vaccine. One dose is recommended after age 71. Pneumococcal polysaccharide (PPSV23) vaccine. One dose is recommended after age 68. Talk to your health care provider about which screenings and vaccines you need and how often you need them. This information is not intended to replace advice given to you by your health care provider. Make sure you discuss any questions you have with your health care provider. Document Released: 07/21/2015 Document Revised: 03/13/2016 Document Reviewed: 04/25/2015 Elsevier Interactive Patient Education  2017 [place] Prevention in the Home Falls can cause injuries. They can happen to people of all ages. There are many things you can do to make your home safe and to help prevent falls. What can I do on the outside of my home? Regularly fix the edges of walkways and driveways and fix any cracks. Remove anything that might make you trip as you walk through a door, such as a raised step or threshold. Trim any bushes or trees on the path to your home. Use bright outdoor lighting. Clear any walking paths of anything that might make someone trip, such as rocks or tools. Regularly check to see if handrails are loose or broken. Make sure that both sides of any steps have handrails. Any raised decks and porches should have guardrails on the edges. Have any leaves, snow, or ice cleared regularly. Use sand or salt on walking paths during winter. Clean up any spills in your garage right away. This includes oil or grease spills. What can I do in the bathroom? Use night lights. Install grab bars by the toilet and in the tub and shower. Do not use towel bars as grab bars. Use non-skid  mats or decals in the tub or shower. If you need to sit down in the shower, use a plastic, non-slip stool. Keep the floor dry. Clean up any water that spills on the floor as soon as it happens. Remove soap buildup in the tub or shower regularly. Attach bath mats securely with double-sided non-slip rug tape. Do not have throw rugs and other things on the floor that can make you trip. What can I do in the bedroom? Use night lights. Make sure that you have a light by your bed that is easy to  reach. Do not use any sheets or blankets that are too big for your bed. They should not hang down onto the floor. Have a firm chair that has side arms. You can use this for support while you get dressed. Do not have throw rugs and other things on the floor that can make you trip. What can I do in the kitchen? Clean up any spills right away. Avoid walking on wet floors. Keep items that you use a lot in easy-to-reach places. If you need to reach something above you, use a strong step stool that has a grab bar. Keep electrical cords out of the way. Do not use floor polish or wax that makes floors slippery. If you must use wax, use non-skid floor wax. Do not have throw rugs and other things on the floor that can make you trip. What can I do with my stairs? Do not leave any items on the stairs. Make sure that there are handrails on both sides of the stairs and use them. Fix handrails that are broken or loose. Make sure that handrails are as long as the stairways. Check any carpeting to make sure that it is firmly attached to the stairs. Fix any carpet that is loose or worn. Avoid having throw rugs at the top or bottom of the stairs. If you do have throw rugs, attach them to the floor with carpet tape. Make sure that you have a light switch at the top of the stairs and the bottom of the stairs. If you do not have them, ask someone to add them for you. What else can I do to help prevent falls? Wear shoes  that: Do not have high heels. Have rubber bottoms. Are comfortable and fit you well. Are closed at the toe. Do not wear sandals. If you use a stepladder: Make sure that it is fully opened. Do not climb a closed stepladder. Make sure that both sides of the stepladder are locked into place. Ask someone to hold it for you, if possible. Clearly mark and make sure that you can see: Any grab bars or handrails. First and last steps. Where the edge of each step is. Use tools that help you move around (mobility aids) if they are needed. These include: Canes. Walkers. Scooters. Crutches. Turn on the lights when you go into a dark area. Replace any light bulbs as soon as they burn out. Set up your furniture so you have a clear path. Avoid moving your furniture around. If any of your floors are uneven, fix them. If there are any pets around you, be aware of where they are. Review your medicines with your doctor. Some medicines can make you feel dizzy. This can increase your chance of falling. Ask your doctor what other things that you can do to help prevent falls. This information is not intended to replace advice given to you by your health care provider. Make sure you discuss any questions you have with your health care provider. Document Released: 04/20/2009 Document Revised: 11/30/2015 Document Reviewed: 07/29/2014 Elsevier Interactive Patient Education  2017 [employer].

## 2021-12-20 NOTE — Progress Notes (Signed)
Subjective:   [name] is a 88 y.o. female who presents for Medicare Annual (Subsequent) preventive examination.  Review of Systems    Virtual Visit via Telephone Note  I connected with  [name] on 12/20/21 at 12:30 PM EDT by telephone and verified that I am speaking with the correct person using two identifiers.  Location: Patient: Home Provider: Office Persons participating in the virtual visit: patient/Nurse Health Advisor   I discussed the limitations, risks, security and privacy concerns of performing an evaluation and management service by telephone and the availability of in person appointments. The patient expressed understanding and agreed to proceed.  Interactive audio and video telecommunications were attempted between this nurse and patient, however failed, due to patient having technical difficulties OR patient did not have access to video capability.  We continued and completed visit with audio only.  Some vital signs may be absent or patient reported.   Criselda Peaches, LPN  Cardiac Risk Factors include: advanced age (>45mn, >>61women);hypertension     Objective:    Today's Vitals   12/20/21 1238  Weight: 137 lb (62.1 kg)  Height: _0  (1.549 m)   Body mass index is 25.89 kg/m.     12/20/2021   12:55 PM 06/22/2021    2:42 PM 06/18/2021    1:07 PM 11/20/2020    1:25 PM 11/03/2020    1:26 PM 09/28/2020   10:52 AM 07/05/2020    4:01 PM  Advanced Directives  Does Patient Have a Medical Advance Directive? Yes Yes Yes Yes No Yes Yes  Type of AParamedicof [place]Living will [place]Living will HChewel[place]Living will HPrincetonLiving will   [place]Living will  Does patient want to make changes to medical advance directive? No - Patient declined No - Patient declined  No - Patient declined  No - Patient declined No - Patient declined  Copy of  HReedsin Chart? No - copy requested No - copy requested  No - copy requested   No - copy requested    Current Medications (verified) Outpatient Encounter Medications as of 12/20/2021  Medication Sig   acetaminophen (TYLENOL) 650 MG CR tablet Take 650 mg by mouth every 8 (eight) hours as needed for pain.   ALPRAZolam (XANAX) 1 MG tablet Take 0.5-1 tablets (0.5-1 mg total) by mouth at bedtime as needed for anxiety. Avoid regular use.   aspirin 81 MG chewable tablet Chew 1 tablet (81 mg total) by mouth 2 (two) times daily.   atorvastatin (LIPITOR) 10 MG tablet TAKE 1 TABLET DAILY (Patient not taking: Reported on 11/19/2021)   cholecalciferol (VITAMIN D3) 25 MCG (1000 UNIT) tablet Take 1,000 Units by mouth daily.   colchicine 0.6 MG tablet TAKE 1 TABLET BY MOUTH TWICE DAILY   cyanocobalamin 1000 MCG tablet Take 1,000 mcg by mouth at bedtime.    docusate sodium (COLACE) 100 MG capsule Take 1 capsule (100 mg total) by mouth 2 (two) times daily. (Patient taking differently: Take 100 mg by mouth daily.)   esomeprazole (NEXIUM) 40 MG capsule TAKE 1 CAPSULE DAILY   furosemide (LASIX) 40 MG tablet TAKE 1 TABLET DAILY OR AS DIRECTED (Patient not taking: Reported on 11/19/2021)   glipiZIDE (GLUCOTROL) 10 MG tablet TAKE 1 TABLET IN THE MORNING   HYDROcodone-acetaminophen (NORCO/VICODIN) 5-325 MG tablet Take 1-2 tablets by mouth every 4 (four) hours as needed for moderate pain (pain  score 4-6).   metoprolol succinate (TOPROL-XL) 25 MG 24 hr tablet Take 1 tablet (25 mg total) by mouth daily. (Patient not taking: Reported on 11/19/2021)   MOVANTIK 25 MG TABS tablet TAKE 1 TABLET(25 MG) BY MOUTH DAILY (Patient not taking: Reported on 11/19/2021)   senna-docusate (SENOKOT-S) 8.6-50 MG tablet Take 2 tablets by mouth 2 (two) times daily. (Patient not taking: Reported on 11/19/2021)   Trospium Chloride 60 MG CP24 Take 60 mg by mouth daily.   No facility-administered encounter medications on file as  of 12/20/2021.    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 , HER2/neu negative-- s/p right lumpectomy (without SLNB) and radiation therapy   Chronic lower back pain    DDD (degenerative disc disease)    lumbar   Diverticulosis    Family history of breast cancer    Family history of pancreatic cancer    Family history of prostate cancer    Flaccid neuropathic bladder, not elsewhere classified    Foley catheter in place    GERD (gastroesophageal reflux disease)    Hemorrhoids    Hiatal hernia    History of colon polyps    History of radiation therapy 09-12-2015 to 10-12-2015   42.72 gray in 16 fractions directed right breast w/ boost of 12 gray in 6 fractions directed at the lumpectomy cavity- Total dose: 54.72y   History of recurrent UTIs    Hyperlipidemia    RBBB (right bundle branch block with left anterior fascicular block)    Type 2 diabetes mellitus ([place])    Urinary retention with incomplete bladder emptying    Wears dentures    full upper and lower partial   Wears glasses    Wears hearing aid    bilateral but does not wear   Past Surgical History:  Procedure Laterality Date   BACK SURGERY     BREAST LUMPECTOMY WITH RADIOACTIVE SEED LOCALIZATION Right 08/03/2015   Procedure: BREAST LUMPECTOMY WITH RADIOACTIVE SEED LOCALIZATION;  Surgeon: [name], MD;  Location: [facility];  Service: General;  Laterality: Right;   BREAST SURGERY     CARDIOVASCULAR STRESS TEST  01/14/2012   Low risk nuclear study w/ small mild apical and apical lateral reversible perfusion defect represents a small area of ischemia versus shifting breast artifact/  normal LV funciton  and wall motion, ef 86%   CARPAL TUNNEL RELEASE Right 1977   CATARACT EXTRACTION W/ INTRAOCULAR LENS  IMPLANT, BILATERAL Bilateral left 1996/  right 1997   CLOSED RIGHT KNEE MANIPULATION  08/11/2002   post TKA   CYSTOSTOMY N/A 05/17/2016   Procedure: CYSTOSCOPY WITH  SUPRAPUBIC PLACMENT;  Surgeon: [name], MD;  Location: [facility];  Service: Urology;  Laterality: N/A;   EYE SURGERY     GANGLION CYST EXCISION Right 12/09/2001   right palm   HERNIA REPAIR     JOINT REPLACEMENT     KNEE ARTHROSCOPY Right 12/14/2002   w/ Lysis Adhesions   LAMINECTOMY  04/24/2020   Bilateral redo L2-3, L3-4 and L4-5 laminotomy/laminectomy/foraminotomies/medial facetectomy to decompress the bilateral L3, L4 and L5 nerve roots   LUMBAR LAMINECTOMY/DECOMPRESSION MICRODISCECTOMY N/A 03/06/2016   Procedure: CENTRAL DECOMPRESSION  L3 - L4 ,L4 - L5;  Surgeon: [name], MD;  Location: [facility];  Service: Orthopedics;  Laterality: N/A;   SHOULDER HEMI-ARTHROPLASTY Right 02/20/2009   avascular necrosis    TOTAL HIP ARTHROPLASTY Left 06/22/2021   Procedure: LEFT TOTAL HIP ARTHROPLASTY ANTERIOR APPROACH;  Surgeon: Mcarthur Rossetti, MD;  Location: [facility];  Service: Orthopedics;  Laterality: Left;   TOTAL KNEE ARTHROPLASTY Bilateral right 06-23-2002/  left [id number]   UMBILICAL HERNIA REPAIR  1958   VAGINAL HYSTERECTOMY  1975   Family History  Problem Relation Age of Onset   Pancreatic cancer Mother 105   Diabetes Mother    Prostate cancer Brother    Diabetes Father    Heart disease Father    Diabetes Brother    Heart attack Brother    Diabetes Sister    Diabetes Daughter    Diabetes Son    Coronary artery disease Brother        MI in his 54s   Colon cancer Neg Hx    Stomach cancer Neg Hx    Social History   Socioeconomic History   Marital status: Widowed    Spouse name: Aviyah Swetz   Number of children: 4   Years of education: 12   Highest education level: 12th grade   Occupational History   Occupation: Retired  Tobacco Use   Smoking status: Never   Smokeless tobacco: Never  Vaping Use   Vaping Use: Never used  Substance and Sexual Activity   Alcohol use: No   Drug use: No   Sexual activity: Never  Other Topics Concern   Not on file  Social History Narrative   HH 2 married   Works for [employer] for 40 years retired   No pets    Lives w spouse; married in 72; 38 years    Social Determinants of Health   Financial Resource Strain: Low Risk  (12/20/2021)   Overall Financial Resource Strain (CARDIA)    Difficulty of Paying Living Expenses: Not hard at all  Food Insecurity: No [address] (12/20/2021)   Hunger Vital Sign    Worried About Running Out of Food in the Last Year: Never true    [place] in the Last Year: Never true  Transportation Needs: No Transportation Needs (12/20/2021)   PRAPARE - Hydrologist (Medical): No    Lack of Transportation (Non-Medical): No  Physical Activity: Insufficiently Active (12/20/2021)   Exercise Vital Sign    Days of Exercise per Week: 7 days    Minutes of Exercise per Session: 10 min  Stress: No Stress Concern Present (12/20/2021)   [place]    Feeling of Stress : Not at all  Social Connections: Moderately Integrated (12/20/2021)   Social Connection and Isolation Panel [NHANES]    Frequency of Communication with Friends and Family: More than three times a week    Frequency of Social Gatherings with Friends and Family: More than three times a week    Attends Religious Services: More than 4 times per year    Active Member of [employer] or Organizations: Yes    Attends Archivist Meetings: More than 4 times per year    Marital Status: Widowed    Tobacco Counseling Counseling given: Not Answered   Clinical Intake:  Pre-visit preparation completed: No  Diabetic?  No  Activities of Daily  Living    12/20/2021   12:48  PM 06/22/2021    2:42 PM  In your present state of health, do you have any difficulty performing the following activities:  Hearing? 1 1  Comment Wears hearing aids   Vision? 0 0  Difficulty concentrating or making decisions? 0 1  Walking or climbing stairs? 1 1  Comment Uses walker   Dressing or bathing?  0  Doing errands, shopping? 1 0  Comment Daughter Diplomatic Services operational officer and eating ? Y   Comment Daughter assist   Using the Toilet? N   In the past six months, have you accidently leaked urine? Y   Comment Wears breifs. Followed by Urologist   Do you have problems with loss of bowel control? Y   Comment Wears breifs. Followed by Urologist   Managing your Medications? N   Managing your Finances? Y   Housekeeping or managing your Housekeeping? Y   Comment Daughter assist     Patient Care Team: Panosh, Standley Brooking, MD as PCP - General (Internal Medicine) Freada Bergeron, MD as PCP - Cardiology (Cardiology) Monna Fam, MD (Ophthalmology) Nicholas Lose, MD as Consulting Physician (Hematology and Oncology) Gery Pray, MD as Consulting Physician (Radiation Oncology) [name], MD as Consulting Physician (General Surgery) [name], MD (Inactive) as Consulting Physician (Urology) Delice Bison, Charlestine Massed, NP as Nurse Practitioner (Hematology and Oncology) Jamse Arn, MD as Consulting Physician (Physical Medicine and Rehabilitation) Dimitri Ped, RN as Case Manager Tat, Eustace Quail, DO as Consulting Physician (Neurology)  Indicate any recent Medical Services you may have received from other than [facility] providers in the past year (date may be approximate).     Assessment:   This is a routine wellness examination for [name].  Hearing/Vision screen Hearing Screening - Comments:: Wears hearing aids Vision Screening - Comments:: Wears glasses. Followed by Dr Herbert Deaner  Dietary issues and exercise activities  discussed: Exercise limited by: None identified   Goals Addressed               This Visit's Progress     Get stronger (pt-stated)         Depression Screen    12/20/2021   12:45 PM 11/19/2021    1:41 PM 08/22/2021    2:40 PM 08/02/2021    1:16 PM 09/28/2020   10:55 AM 07/05/2020    3:53 PM 06/28/2020   11:24 AM  PHQ 2/9 Scores  PHQ - 2 Score 0 0 _0 PHQ- 9 Score _1 Fall Risk    12/20/2021   12:54 PM 11/19/2021    1:41 PM 08/22/2021    2:39 PM 08/02/2021    1:16 PM 01/11/2021   11:25 AM  Fall Risk   Falls in the past year? 1 0 1 1 0  Number falls in past yr: 0 0 0 1 0  Injury with Fall? 1 0 _2 Comment Bruised breast. Followed by PCP      Risk for fall due to :  No Fall Risks   Impaired mobility  Follow up  Falls evaluation completed   Falls prevention discussed    FALL RISK PREVENTION PERTAINING TO THE HOME:  Any stairs in or around the home? Yes  If so, are there any without handrails? No  Home free of loose throw rugs in walkways, pet beds, electrical cords, etc? Yes  Adequate lighting in your home to reduce risk of falls? Yes  ASSISTIVE DEVICES UTILIZED TO PREVENT FALLS:  Life alert? No  Use of a cane, walker or w/c? Yes  Grab bars in the bathroom? Yes  Shower chair or bench in shower? Yes  Elevated toilet seat or a handicapped toilet? Yes   TIMED UP AND GO:  Was the test performed? No . Audio Visit  Cognitive Function:        12/20/2021   12:55 PM  6CIT Screen  What Year? 0 points  What month? 0 points  What time? 0 points  Count back from 20 0 points  Months in reverse 0 points  Repeat phrase 0 points  Total Score 0 points    Immunizations Immunization History  Administered Date(s) Administered   Fluad Quad(high Dose 65+) 04/24/2019, 04/12/2020   Influenza Split 05/08/2011, 04/21/2012   Influenza Whole 03/11/2006, 04/30/2007, 04/13/2008, 05/17/2009, 04/24/2010   Influenza, High Dose Seasonal PF 05/11/2014,  05/01/2015, 04/26/2016, 04/29/2017, 05/19/2018   Influenza,inj,Quad PF,6+ Mos 03/22/2013   Influenza,inj,quad, With Preservative 03/25/2019   Influenza-Unspecified 04/13/2021   Moderna Sars-Covid-2 Vaccination 07/19/2019, 08/16/2019, 06/08/2020   Pneumococcal Conjugate-13 12/16/2013   Pneumococcal Polysaccharide-23 05/09/1999, 05/18/2008   Tetanus 01/11/2013   Zoster, Live 05/06/2006    TDAP status: Up to date  Flu Vaccine status: Up to date  Pneumococcal vaccine status: Up to date  Covid-19 vaccine status: Completed vaccines  Qualifies for Shingles Vaccine? Yes   Zostavax completed No   Shingrix Completed?: No.    Education has been provided regarding the importance of this vaccine. Patient has been advised to call insurance company to determine out of pocket expense if they have not yet received this vaccine. Advised may also receive vaccine at local pharmacy or Health Dept. Verbalized acceptance and understanding.  Screening Tests Health Maintenance  Topic Date Due   COVID-19 Vaccine (4 - Booster for Moderna series) 01/30/2022 (Originally 08/03/2020)   Zoster Vaccines- Shingrix (1 of 2) 01/30/2022 (Originally 02/21/1949)   FOOT EXAM  05/22/2022 (Originally 08/30/2020)   OPHTHALMOLOGY EXAM  05/22/2022 (Originally 09/06/2021)   DEXA SCAN  09/05/2028 (Originally 02/20/1997)   INFLUENZA VACCINE  02/05/2022   HEMOGLOBIN A1C  05/22/2022   URINE MICROALBUMIN  08/22/2022   TETANUS/TDAP  01/12/2023   Pneumonia Vaccine 86+ Years old  Completed   HPV VACCINES  Aged Out    Health Maintenance  There are no preventive care reminders to display for this patient.  Colorectal cancer screening: No longer required.   Mammogram status: No longer required due to Age.    Lung Cancer Screening: (Low Dose CT Chest recommended if Age 64-80 years, 30 pack-year currently smoking OR have quit w/in 15years.) does not qualify.     Additional Screening:  Hepatitis C Screening: does not qualify;  Completed   Vision Screening: Recommended annual ophthalmology exams for early detection of glaucoma and other disorders of the eye. Is the patient up to date with their annual eye exam?  Yes  Who is the provider or what is the name of the office in which the patient attends annual eye exams? Dr Herbert Deaner If pt is not established with a provider, would they like to be referred to a provider to establish care? No .   Dental Screening: Recommended annual dental exams for proper oral hygiene  Community Resource Referral / Chronic Care Management:  CRR required this visit?  No   CCM required this visit?  No      Plan:     I have personally reviewed and noted the following  in the patient's chart:   Medical and social history Use of alcohol, tobacco or illicit drugs  Current medications and supplements including opioid prescriptions.  Functional ability and status Nutritional status Physical activity Advanced directives List of other physicians Hospitalizations, surgeries, and ER visits in previous 12 months Vitals Screenings to include cognitive, depression, and falls Referrals and appointments  In addition, I have reviewed and discussed with patient certain preventive protocols, quality metrics, and best practice recommendations. A written personalized care plan for preventive services as well as general preventive health recommendations were provided to patient.     Criselda Peaches, LPN   01/20/9677   Nurse Notes: None

## 2021-12-28 ENCOUNTER — Ambulatory Visit: Payer: Self-pay

## 2021-12-28 DIAGNOSIS — E1169 Type 2 diabetes mellitus with other specified complication: Secondary | ICD-10-CM

## 2021-12-31 ENCOUNTER — Other Ambulatory Visit (INDEPENDENT_AMBULATORY_CARE_PROVIDER_SITE_OTHER): Payer: Medicare Other

## 2021-12-31 DIAGNOSIS — N1832 Chronic kidney disease, stage 3b: Secondary | ICD-10-CM | POA: Diagnosis not present

## 2021-12-31 DIAGNOSIS — E871 Hypo-osmolality and hyponatremia: Secondary | ICD-10-CM | POA: Diagnosis not present

## 2021-12-31 LAB — BASIC METABOLIC PANEL
BUN: 29 mg/dL — ABNORMAL HIGH (ref 6–23)
CO2: 28 mEq/L (ref 19–32)
Calcium: 9.7 mg/dL (ref 8.4–10.5)
Chloride: 93 mEq/L — ABNORMAL LOW (ref 96–112)
Creatinine, Ser: 1.26 mg/dL — ABNORMAL HIGH (ref 0.40–1.20)
GFR: 37.76 mL/min — ABNORMAL LOW (ref >60.00)
Glucose, Bld: 85 mg/dL (ref 70–99)
Potassium: 4.7 mEq/L (ref 3.5–5.1)
Sodium: 127 mEq/L — ABNORMAL LOW (ref 135–145)

## 2022-01-02 ENCOUNTER — Other Ambulatory Visit: Payer: Self-pay

## 2022-01-02 DIAGNOSIS — Z79899 Other long term (current) drug therapy: Secondary | ICD-10-CM

## 2022-01-02 NOTE — Progress Notes (Signed)
Sodium level is still low about the same but kidney function is slightly improved. Cannot say why it is like this however sometimes some forms of diuretics we will do this or other medicine side effects.  That I do not see on your list.  So  order serum osmolarity , BMP,  urine sodium, and urine osmolarity . In 2-3  weeks ( non fasting)  If this continues  we can get  help from endocrine or kidney specialists

## 2022-01-07 DIAGNOSIS — R3914 Feeling of incomplete bladder emptying: Secondary | ICD-10-CM | POA: Diagnosis not present

## 2022-01-18 ENCOUNTER — Other Ambulatory Visit: Payer: Self-pay | Admitting: Internal Medicine

## 2022-01-18 DIAGNOSIS — M7989 Other specified soft tissue disorders: Secondary | ICD-10-CM

## 2022-01-22 NOTE — Progress Notes (Unsigned)
ACUTE VISIT No chief complaint on file.  HPI: Ms.[name] is a 88 y.o. female, who is here today complaining of *** HPI  Review of Systems Rest see pertinent positives and negatives per HPI.  Current Outpatient Medications on File Prior to Visit  Medication Sig Dispense Refill  . acetaminophen (TYLENOL) 650 MG CR tablet Take 650 mg by mouth every 8 (eight) hours as needed for pain.    Marland Kitchen ALPRAZolam (XANAX) 1 MG tablet Take 0.5-1 tablets (0.5-1 mg total) by mouth at bedtime as needed for anxiety. Avoid regular use. 24 tablet 0  . aspirin 81 MG chewable tablet Chew 1 tablet (81 mg total) by mouth 2 (two) times daily. 35 tablet 0  . atorvastatin (LIPITOR) 10 MG tablet TAKE 1 TABLET DAILY (Patient not taking: Reported on 11/19/2021) 90 tablet 3  . cholecalciferol (VITAMIN D3) 25 MCG (1000 UNIT) tablet Take 1,000 Units by mouth daily.    . colchicine 0.6 MG tablet TAKE 1 TABLET BY MOUTH TWICE DAILY 30 tablet 1  . cyanocobalamin 1000 MCG tablet Take 1,000 mcg by mouth at bedtime.     . docusate sodium (COLACE) 100 MG capsule Take 1 capsule (100 mg total) by mouth 2 (two) times daily. (Patient taking differently: Take 100 mg by mouth daily.) 60 capsule 0  . esomeprazole (NEXIUM) 40 MG capsule TAKE 1 CAPSULE DAILY 90 capsule 1  . furosemide (LASIX) 40 MG tablet TAKE 1 TABLET DAILY OR AS DIRECTED (Patient not taking: Reported on 11/19/2021) 90 tablet 1  . glipiZIDE (GLUCOTROL) 10 MG tablet TAKE 1 TABLET IN THE MORNING 90 tablet 1  . HYDROcodone-acetaminophen (NORCO/VICODIN) 5-325 MG tablet Take 1-2 tablets by mouth every 4 (four) hours as needed for moderate pain (pain score 4-6). 30 tablet 0  . metoprolol succinate (TOPROL-XL) 25 MG 24 hr tablet Take 1 tablet (25 mg total) by mouth daily. (Patient not taking: Reported on 11/19/2021) 90 tablet 1  . MOVANTIK 25 MG TABS tablet TAKE 1 TABLET(25 MG) BY MOUTH DAILY (Patient not taking: Reported on 11/19/2021) 30 tablet 1  . senna-docusate  (SENOKOT-S) 8.6-50 MG tablet Take 2 tablets by mouth 2 (two) times daily. (Patient not taking: Reported on 11/19/2021) 120 tablet 2  . Trospium Chloride 60 MG CP24 Take 60 mg by mouth daily.     No current facility-administered medications on file prior to visit.     Past Medical History:  Diagnosis Date  . Arthritis   . Bilateral edema of lower extremity   . Breast cancer of upper-outer quadrant of right female breast 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 Chronic lower back pain   . DDD (degenerative disc disease)    lumbar  . Diverticulosis   . Family history of breast cancer   . Family history of pancreatic cancer   . Family history of prostate cancer   . Flaccid neuropathic bladder, not elsewhere classified   . Foley catheter in place   . GERD (gastroesophageal reflux disease)   . Hemorrhoids   . Hiatal hernia   . History of colon polyps   . History of radiation therapy 09-12-2015 to 10-12-2015   42.72 gray in 16 fractions directed right breast w/ boost of 12 gray in 6 fractions directed at the lumpectomy cavity- Total dose: 54.72y  . History of recurrent UTIs   .  Hyperlipidemia   . RBBB (right bundle branch block with left anterior fascicular block)   . Type 2 diabetes mellitus ([place])   . Urinary retention with incomplete bladder emptying   . Wears dentures    full upper and lower partial  . Wears glasses   . Wears hearing aid    bilateral but does not wear   Allergies  Allergen Reactions  . Phenergan [Promethazine]     Slurred speech, acted very confused  . Remeron [Mirtazapine] Itching    Social History   Socioeconomic History  . Marital status: Widowed    Spouse name: Alayia Meggison  . Number of children: 4  . Years of education: 25  . Highest education level: 12th grade  Occupational History  . Occupation:  Retired  Tobacco Use  . Smoking status: Never  . Smokeless tobacco: Never  Vaping Use  . Vaping Use: Never used  Substance and Sexual Activity  . Alcohol use: No  . Drug use: No  . Sexual activity: Never  Other Topics Concern  . Not on file  Social History Narrative   HH 2 married   Works for [employer] for 40 years retired   No pets    Lives w spouse; married in 72; 98 years    Social Determinants of Health   Financial Resource Strain: Low Risk  (12/20/2021)   Overall Financial Resource Strain (CARDIA)   . Difficulty of Paying Living Expenses: Not hard at all  Food Insecurity: No Food Insecurity (12/20/2021)   Hunger Vital Sign   . Worried About Charity fundraiser in the Last Year: Never true   . Ran Out of Food in the Last Year: Never true  Transportation Needs: No Transportation Needs (12/20/2021)   PRAPARE - Transportation   . Lack of Transportation (Medical): No   . Lack of Transportation (Non-Medical): No  Physical Activity: Insufficiently Active (12/20/2021)   Exercise Vital Sign   . Days of Exercise per Week: 7 days   . Minutes of Exercise per Session: 10 min  Stress: No Stress Concern Present (12/20/2021)   [place]   . Feeling of Stress : Not at all  Social Connections: Moderately Integrated (12/20/2021)   Social Connection and Isolation Panel [NHANES]   . Frequency of Communication with Friends and Family: More than three times a week   . Frequency of Social Gatherings with Friends and Family: More than three times a week   . Attends Religious Services: More than 4 times per year   . Active Member of Clubs or Organizations: Yes   . Attends Archivist Meetings: More than 4 times per year   . Marital Status: Widowed    There were no vitals filed for this visit. There is no height or weight on file to calculate BMI.  Physical Exam  ASSESSMENT AND PLAN:  There are no diagnoses linked  to this encounter.   No follow-ups on file.   Doris Mcgilvery G. [place], MD  [facility]. [place] office.  Discharge Instructions   None

## 2022-01-23 ENCOUNTER — Ambulatory Visit (INDEPENDENT_AMBULATORY_CARE_PROVIDER_SITE_OTHER): Payer: Medicare Other | Admitting: Family Medicine

## 2022-01-23 ENCOUNTER — Encounter: Payer: Self-pay | Admitting: Family Medicine

## 2022-01-23 ENCOUNTER — Other Ambulatory Visit: Payer: Medicare Other

## 2022-01-23 VITALS — BP 110/78 | HR 99 | Resp 16 | Ht 61.0 in | Wt 134.5 lb

## 2022-01-23 DIAGNOSIS — G63 Polyneuropathy in diseases classified elsewhere: Secondary | ICD-10-CM | POA: Diagnosis not present

## 2022-01-23 DIAGNOSIS — K219 Gastro-esophageal reflux disease without esophagitis: Secondary | ICD-10-CM | POA: Diagnosis not present

## 2022-01-23 DIAGNOSIS — L89152 Pressure ulcer of sacral region, stage 2: Secondary | ICD-10-CM

## 2022-01-23 DIAGNOSIS — N1832 Chronic kidney disease, stage 3b: Secondary | ICD-10-CM

## 2022-01-23 DIAGNOSIS — Z79899 Other long term (current) drug therapy: Secondary | ICD-10-CM

## 2022-01-23 NOTE — Patient Instructions (Addendum)
A few things to remember from today's visit:  Sacral decubitus ulcer, stage II ([place]) - Plan: Ambulatory referral to [facility]  Polyneuropathy associated with underlying disease ([place])  Gastroesophageal reflux disease, unspecified whether esophagitis present Try to take Nexium every third day. Continue avoiding foods that can irritate the stomach.  Keep wound clean with soap and water, avoid Neosporin. Desitin on affected area at bedtime may help. Avoid pressure on wound, you can use a donut pillow. Monitor for signs of infection.  Wound care will be arranged.  Please be sure medication list is accurate. If a new problem present, please set up appointment sooner than planned today.

## 2022-01-24 LAB — BASIC METABOLIC PANEL
BUN: 33 mg/dL — ABNORMAL HIGH (ref 6–23)
CO2: 28 mEq/L (ref 19–32)
Calcium: 9.8 mg/dL (ref 8.4–10.5)
Chloride: 90 mEq/L — ABNORMAL LOW (ref 96–112)
Creatinine, Ser: 1.4 mg/dL — ABNORMAL HIGH (ref 0.40–1.20)
GFR: 33.26 mL/min — ABNORMAL LOW (ref >60.00)
Glucose, Bld: 50 mg/dL — ABNORMAL LOW (ref 70–99)
Potassium: 4.7 mEq/L (ref 3.5–5.1)
Sodium: 126 mEq/L — ABNORMAL LOW (ref 135–145)

## 2022-01-25 DIAGNOSIS — K449 Diaphragmatic hernia without obstruction or gangrene: Secondary | ICD-10-CM | POA: Diagnosis not present

## 2022-01-25 DIAGNOSIS — M5116 Intervertebral disc disorders with radiculopathy, lumbar region: Secondary | ICD-10-CM | POA: Diagnosis not present

## 2022-01-25 DIAGNOSIS — H9190 Unspecified hearing loss, unspecified ear: Secondary | ICD-10-CM | POA: Diagnosis not present

## 2022-01-25 DIAGNOSIS — L89152 Pressure ulcer of sacral region, stage 2: Secondary | ICD-10-CM | POA: Diagnosis not present

## 2022-01-25 DIAGNOSIS — K219 Gastro-esophageal reflux disease without esophagitis: Secondary | ICD-10-CM | POA: Diagnosis not present

## 2022-01-25 DIAGNOSIS — Z4800 Encounter for change or removal of nonsurgical wound dressing: Secondary | ICD-10-CM | POA: Diagnosis not present

## 2022-01-25 DIAGNOSIS — E559 Vitamin D deficiency, unspecified: Secondary | ICD-10-CM | POA: Diagnosis not present

## 2022-01-25 DIAGNOSIS — Z853 Personal history of malignant neoplasm of breast: Secondary | ICD-10-CM | POA: Diagnosis not present

## 2022-01-25 DIAGNOSIS — E1142 Type 2 diabetes mellitus with diabetic polyneuropathy: Secondary | ICD-10-CM | POA: Diagnosis not present

## 2022-01-25 DIAGNOSIS — E1122 Type 2 diabetes mellitus with diabetic chronic kidney disease: Secondary | ICD-10-CM | POA: Diagnosis not present

## 2022-01-25 DIAGNOSIS — E039 Hypothyroidism, unspecified: Secondary | ICD-10-CM | POA: Diagnosis not present

## 2022-01-25 DIAGNOSIS — Z87440 Personal history of urinary (tract) infections: Secondary | ICD-10-CM | POA: Diagnosis not present

## 2022-01-25 DIAGNOSIS — K5790 Diverticulosis of intestine, part unspecified, without perforation or abscess without bleeding: Secondary | ICD-10-CM | POA: Diagnosis not present

## 2022-01-25 DIAGNOSIS — N1832 Chronic kidney disease, stage 3b: Secondary | ICD-10-CM | POA: Diagnosis not present

## 2022-01-25 DIAGNOSIS — N319 Neuromuscular dysfunction of bladder, unspecified: Secondary | ICD-10-CM | POA: Diagnosis not present

## 2022-01-25 DIAGNOSIS — Z9350 Unspecified cystostomy status: Secondary | ICD-10-CM | POA: Diagnosis not present

## 2022-01-25 DIAGNOSIS — M159 Polyosteoarthritis, unspecified: Secondary | ICD-10-CM | POA: Diagnosis not present

## 2022-01-25 DIAGNOSIS — G8929 Other chronic pain: Secondary | ICD-10-CM | POA: Diagnosis not present

## 2022-01-25 DIAGNOSIS — Z7984 Long term (current) use of oral hypoglycemic drugs: Secondary | ICD-10-CM | POA: Diagnosis not present

## 2022-01-25 DIAGNOSIS — I4510 Unspecified right bundle-branch block: Secondary | ICD-10-CM | POA: Diagnosis not present

## 2022-01-25 DIAGNOSIS — E785 Hyperlipidemia, unspecified: Secondary | ICD-10-CM | POA: Diagnosis not present

## 2022-01-25 DIAGNOSIS — R600 Localized edema: Secondary | ICD-10-CM | POA: Diagnosis not present

## 2022-01-25 DIAGNOSIS — Z7982 Long term (current) use of aspirin: Secondary | ICD-10-CM | POA: Diagnosis not present

## 2022-01-25 DIAGNOSIS — Z9181 History of falling: Secondary | ICD-10-CM | POA: Diagnosis not present

## 2022-01-30 LAB — OSMOLALITY: Osmolality: 279 mOsm/kg (ref 278–305)

## 2022-01-30 LAB — SODIUM, URINE, RANDOM: Sodium, Ur: 62 mmol/L (ref 28–272)

## 2022-01-30 LAB — OSMOLALITY, URINE

## 2022-01-31 DIAGNOSIS — N1832 Chronic kidney disease, stage 3b: Secondary | ICD-10-CM | POA: Diagnosis not present

## 2022-01-31 DIAGNOSIS — L89152 Pressure ulcer of sacral region, stage 2: Secondary | ICD-10-CM | POA: Diagnosis not present

## 2022-01-31 DIAGNOSIS — M159 Polyosteoarthritis, unspecified: Secondary | ICD-10-CM | POA: Diagnosis not present

## 2022-01-31 DIAGNOSIS — K5790 Diverticulosis of intestine, part unspecified, without perforation or abscess without bleeding: Secondary | ICD-10-CM | POA: Diagnosis not present

## 2022-01-31 DIAGNOSIS — E1142 Type 2 diabetes mellitus with diabetic polyneuropathy: Secondary | ICD-10-CM | POA: Diagnosis not present

## 2022-01-31 DIAGNOSIS — E1122 Type 2 diabetes mellitus with diabetic chronic kidney disease: Secondary | ICD-10-CM | POA: Diagnosis not present

## 2022-02-09 DIAGNOSIS — K5790 Diverticulosis of intestine, part unspecified, without perforation or abscess without bleeding: Secondary | ICD-10-CM | POA: Diagnosis not present

## 2022-02-09 DIAGNOSIS — E1142 Type 2 diabetes mellitus with diabetic polyneuropathy: Secondary | ICD-10-CM | POA: Diagnosis not present

## 2022-02-09 DIAGNOSIS — E1122 Type 2 diabetes mellitus with diabetic chronic kidney disease: Secondary | ICD-10-CM | POA: Diagnosis not present

## 2022-02-09 DIAGNOSIS — N1832 Chronic kidney disease, stage 3b: Secondary | ICD-10-CM | POA: Diagnosis not present

## 2022-02-09 DIAGNOSIS — L89152 Pressure ulcer of sacral region, stage 2: Secondary | ICD-10-CM | POA: Diagnosis not present

## 2022-02-09 DIAGNOSIS — M159 Polyosteoarthritis, unspecified: Secondary | ICD-10-CM | POA: Diagnosis not present

## 2022-02-11 DIAGNOSIS — R338 Other retention of urine: Secondary | ICD-10-CM | POA: Diagnosis not present

## 2022-02-14 ENCOUNTER — Other Ambulatory Visit: Payer: Self-pay | Admitting: Internal Medicine

## 2022-02-14 DIAGNOSIS — L89152 Pressure ulcer of sacral region, stage 2: Secondary | ICD-10-CM | POA: Diagnosis not present

## 2022-02-14 DIAGNOSIS — K5790 Diverticulosis of intestine, part unspecified, without perforation or abscess without bleeding: Secondary | ICD-10-CM | POA: Diagnosis not present

## 2022-02-14 DIAGNOSIS — N1832 Chronic kidney disease, stage 3b: Secondary | ICD-10-CM | POA: Diagnosis not present

## 2022-02-14 DIAGNOSIS — E1122 Type 2 diabetes mellitus with diabetic chronic kidney disease: Secondary | ICD-10-CM | POA: Diagnosis not present

## 2022-02-14 DIAGNOSIS — M159 Polyosteoarthritis, unspecified: Secondary | ICD-10-CM | POA: Diagnosis not present

## 2022-02-14 DIAGNOSIS — E1142 Type 2 diabetes mellitus with diabetic polyneuropathy: Secondary | ICD-10-CM | POA: Diagnosis not present

## 2022-02-15 NOTE — Progress Notes (Signed)
So low sodium continues.  Can be from meds or overhydration ( less likely)  How is the skin ulcer doing?    We need a follow up  plan    make a   follow up appt  or if cannot do this at least a virtual /telephone visit

## 2022-02-20 ENCOUNTER — Encounter: Payer: Self-pay | Admitting: Internal Medicine

## 2022-02-20 ENCOUNTER — Ambulatory Visit (INDEPENDENT_AMBULATORY_CARE_PROVIDER_SITE_OTHER): Payer: Medicare Other | Admitting: Internal Medicine

## 2022-02-20 VITALS — Ht 60.5 in | Wt 134.0 lb

## 2022-02-20 DIAGNOSIS — E871 Hypo-osmolality and hyponatremia: Secondary | ICD-10-CM

## 2022-02-20 DIAGNOSIS — G63 Polyneuropathy in diseases classified elsewhere: Secondary | ICD-10-CM

## 2022-02-20 DIAGNOSIS — D649 Anemia, unspecified: Secondary | ICD-10-CM | POA: Diagnosis not present

## 2022-02-20 DIAGNOSIS — L299 Pruritus, unspecified: Secondary | ICD-10-CM | POA: Diagnosis not present

## 2022-02-20 DIAGNOSIS — N1832 Chronic kidney disease, stage 3b: Secondary | ICD-10-CM | POA: Diagnosis not present

## 2022-02-20 DIAGNOSIS — N319 Neuromuscular dysfunction of bladder, unspecified: Secondary | ICD-10-CM | POA: Diagnosis not present

## 2022-02-20 DIAGNOSIS — M7989 Other specified soft tissue disorders: Secondary | ICD-10-CM

## 2022-02-20 DIAGNOSIS — G479 Sleep disorder, unspecified: Secondary | ICD-10-CM

## 2022-02-20 DIAGNOSIS — Z79899 Other long term (current) drug therapy: Secondary | ICD-10-CM | POA: Diagnosis not present

## 2022-02-20 MED ORDER — COLCHICINE 0.6 MG PO TABS: 0.6000 mg | ORAL_TABLET | Freq: Every day | ORAL | 1 refills | Status: DC

## 2022-02-20 MED ORDER — ALPRAZOLAM 1 MG PO TABS: 0.5000 mg | ORAL_TABLET | Freq: Every evening | ORAL | 0 refills | Status: DC | PRN

## 2022-02-20 NOTE — Progress Notes (Signed)
Virtual Visit via Telephone Note  I connected with  [name]  on 02/20/22 at 11:30 AM EDT by telephone and verified that I am speaking with the correct person using two identifiers.   I discussed the limitations, risks, security and privacy concerns of performing an evaluation and management service by telephone and the limited availability of in person appointments. tThere may be a patient responsible charge related to this service. The patient expressed understanding and agreed to proceed.  Location patient: home Location provider: work office Participants present for the call: patient, provider Patient did not have a visit in the prior 7 days to address this/these issue(s).   History of Present Illness: [name] presents for follow-up med check status.  Today is her 89th birthday! Wound ulcer is still present Home health is coming weekly for up to 9 weeks it is improving but still present. Blood sugar is fine denies any low or problems Needs refill of the alprazolam states is the only way she gets some sleep will take a half at night to help her fall asleep then often gets up in the middle the night feeling fidgety takes Tylenol and goes back to sleep. Denies any falling or sedation  , With itching uncertain of dry skin that can be interfering. Asked for refill of colchicine gout medicine sent to the mail away pharmacy she is taking 1 a day.  Redness around her toes seem to be better Takes Lasix once a day Gets some intermittent numbness tingling in her toes question if she is getting neuropathy.  Can walk around self-care does her balance exercises. She drinks 4 x 16 ounces of water a day as per urology to keep her catheter bladder open color of urine is pale yellow in the day and darker at night.    Observations/Objective: Patient sounds personable and well on the phone. I do not appreciate any SOB.  Has some hearing deficit and her daughter is not available right  now and patient recommends contact later Speech and thought processing are grossly intact. Patient reported vitals: Lab Results  Component Value Date   WBC 5.3 11/19/2021   HGB 10.7 (L) 11/19/2021   HCT 31.0 (L) 11/19/2021   PLT 339.0 11/19/2021   GLUCOSE 50 Repeated and verified X2. (L) 01/23/2022   CHOL 148 08/22/2021   TRIG 221.0 (H) 08/22/2021   HDL 44.90 08/22/2021   LDLDIRECT 66.0 08/22/2021   LDLCALC 83 12/16/2013   ALT 7 08/22/2021   AST 12 08/22/2021   NA 126 (L) 01/23/2022   K 4.7 01/23/2022   CL 90 (L) 01/23/2022   CREATININE 1.40 (H) 01/23/2022   BUN 33 (H) 01/23/2022   CO2 28 01/23/2022   TSH 4.65 08/22/2021   INR 1.0 12/25/2019   HGBA1C 6.7 (H) 11/19/2021   MICROALBUR 1.0 08/22/2021    Assessment and Plan:  Hyponatremia unclear cause have been seeing it in many patients recently.  Not supposed to restrict water based on her urologic recommendations and would like to stay on the Lasix. Perhaps can reduce her water slightly did not want her to increase her salt intake at this time. When can get a ride is willing to follow-up blood work as last sodium level was done a month ago. Patient would like to not change anything at this time to stay on current regimen since she feels the same as usual with no symptoms of hyponatremia. But agrees to do follow-up labs when her daughter can  give her a ride. In regard to the tingling of her toes that come and go we will get updated blood sugar B12 blood count levels to rule out causes amenable to intervention.  At this time it appears that benefit is more than risk of the alprazolam half dose in the past we have tried many medicines seem to have failed some more when her husband was alive see previous notes. We will plan to have the office contact daughter who is the driver about timing for a lab appointment and I will place orders. We will refill the colchicine as she states has been helpful taking it once a day sent to mail  away. Follow Up Instructions:Future lab when can get ride    [id number] [id number] [id number] 11-20 [id number] 21-30 I did not refer this patient for an OV in the next 24 hours for this/these issue(s).  I discussed the assessment and treatment plan with the patient. The patient was provided an opportunity to ask questions and answered. The patient agreed with the plan and demonstrated an understanding of the instructions.   The patient was advised to call back or seek an in-person evaluation if the symptoms worsen or if the condition fails to improve as anticipated.  I provided 21 minutes of non-face-to-face time during this encounter. Return for lab appt  see text .  Shanon Ace, MD

## 2022-02-21 DIAGNOSIS — M159 Polyosteoarthritis, unspecified: Secondary | ICD-10-CM | POA: Diagnosis not present

## 2022-02-21 DIAGNOSIS — N1832 Chronic kidney disease, stage 3b: Secondary | ICD-10-CM | POA: Diagnosis not present

## 2022-02-21 DIAGNOSIS — L89152 Pressure ulcer of sacral region, stage 2: Secondary | ICD-10-CM | POA: Diagnosis not present

## 2022-02-21 DIAGNOSIS — K5790 Diverticulosis of intestine, part unspecified, without perforation or abscess without bleeding: Secondary | ICD-10-CM | POA: Diagnosis not present

## 2022-02-21 DIAGNOSIS — E1122 Type 2 diabetes mellitus with diabetic chronic kidney disease: Secondary | ICD-10-CM | POA: Diagnosis not present

## 2022-02-21 DIAGNOSIS — E1142 Type 2 diabetes mellitus with diabetic polyneuropathy: Secondary | ICD-10-CM | POA: Diagnosis not present

## 2022-02-24 DIAGNOSIS — E559 Vitamin D deficiency, unspecified: Secondary | ICD-10-CM | POA: Diagnosis not present

## 2022-02-24 DIAGNOSIS — K5790 Diverticulosis of intestine, part unspecified, without perforation or abscess without bleeding: Secondary | ICD-10-CM | POA: Diagnosis not present

## 2022-02-24 DIAGNOSIS — E785 Hyperlipidemia, unspecified: Secondary | ICD-10-CM | POA: Diagnosis not present

## 2022-02-24 DIAGNOSIS — K449 Diaphragmatic hernia without obstruction or gangrene: Secondary | ICD-10-CM | POA: Diagnosis not present

## 2022-02-24 DIAGNOSIS — Z853 Personal history of malignant neoplasm of breast: Secondary | ICD-10-CM | POA: Diagnosis not present

## 2022-02-24 DIAGNOSIS — Z4800 Encounter for change or removal of nonsurgical wound dressing: Secondary | ICD-10-CM | POA: Diagnosis not present

## 2022-02-24 DIAGNOSIS — I4510 Unspecified right bundle-branch block: Secondary | ICD-10-CM | POA: Diagnosis not present

## 2022-02-24 DIAGNOSIS — Z7984 Long term (current) use of oral hypoglycemic drugs: Secondary | ICD-10-CM | POA: Diagnosis not present

## 2022-02-24 DIAGNOSIS — E1142 Type 2 diabetes mellitus with diabetic polyneuropathy: Secondary | ICD-10-CM | POA: Diagnosis not present

## 2022-02-24 DIAGNOSIS — R600 Localized edema: Secondary | ICD-10-CM | POA: Diagnosis not present

## 2022-02-24 DIAGNOSIS — M159 Polyosteoarthritis, unspecified: Secondary | ICD-10-CM | POA: Diagnosis not present

## 2022-02-24 DIAGNOSIS — L89152 Pressure ulcer of sacral region, stage 2: Secondary | ICD-10-CM | POA: Diagnosis not present

## 2022-02-24 DIAGNOSIS — Z7982 Long term (current) use of aspirin: Secondary | ICD-10-CM | POA: Diagnosis not present

## 2022-02-24 DIAGNOSIS — Z87440 Personal history of urinary (tract) infections: Secondary | ICD-10-CM | POA: Diagnosis not present

## 2022-02-24 DIAGNOSIS — Z9350 Unspecified cystostomy status: Secondary | ICD-10-CM | POA: Diagnosis not present

## 2022-02-24 DIAGNOSIS — Z9181 History of falling: Secondary | ICD-10-CM | POA: Diagnosis not present

## 2022-02-24 DIAGNOSIS — E039 Hypothyroidism, unspecified: Secondary | ICD-10-CM | POA: Diagnosis not present

## 2022-02-24 DIAGNOSIS — N319 Neuromuscular dysfunction of bladder, unspecified: Secondary | ICD-10-CM | POA: Diagnosis not present

## 2022-02-24 DIAGNOSIS — N1832 Chronic kidney disease, stage 3b: Secondary | ICD-10-CM | POA: Diagnosis not present

## 2022-02-24 DIAGNOSIS — G8929 Other chronic pain: Secondary | ICD-10-CM | POA: Diagnosis not present

## 2022-02-24 DIAGNOSIS — E1122 Type 2 diabetes mellitus with diabetic chronic kidney disease: Secondary | ICD-10-CM | POA: Diagnosis not present

## 2022-02-24 DIAGNOSIS — M5116 Intervertebral disc disorders with radiculopathy, lumbar region: Secondary | ICD-10-CM | POA: Diagnosis not present

## 2022-02-24 DIAGNOSIS — K219 Gastro-esophageal reflux disease without esophagitis: Secondary | ICD-10-CM | POA: Diagnosis not present

## 2022-02-24 DIAGNOSIS — H9190 Unspecified hearing loss, unspecified ear: Secondary | ICD-10-CM | POA: Diagnosis not present

## 2022-02-27 DIAGNOSIS — N1832 Chronic kidney disease, stage 3b: Secondary | ICD-10-CM | POA: Diagnosis not present

## 2022-02-27 DIAGNOSIS — K5790 Diverticulosis of intestine, part unspecified, without perforation or abscess without bleeding: Secondary | ICD-10-CM | POA: Diagnosis not present

## 2022-02-27 DIAGNOSIS — E1122 Type 2 diabetes mellitus with diabetic chronic kidney disease: Secondary | ICD-10-CM | POA: Diagnosis not present

## 2022-02-27 DIAGNOSIS — L89152 Pressure ulcer of sacral region, stage 2: Secondary | ICD-10-CM | POA: Diagnosis not present

## 2022-02-27 DIAGNOSIS — M159 Polyosteoarthritis, unspecified: Secondary | ICD-10-CM | POA: Diagnosis not present

## 2022-02-27 DIAGNOSIS — E1142 Type 2 diabetes mellitus with diabetic polyneuropathy: Secondary | ICD-10-CM | POA: Diagnosis not present

## 2022-03-07 ENCOUNTER — Encounter: Payer: Self-pay | Admitting: Internal Medicine

## 2022-03-07 DIAGNOSIS — K5790 Diverticulosis of intestine, part unspecified, without perforation or abscess without bleeding: Secondary | ICD-10-CM | POA: Diagnosis not present

## 2022-03-07 DIAGNOSIS — E1142 Type 2 diabetes mellitus with diabetic polyneuropathy: Secondary | ICD-10-CM | POA: Diagnosis not present

## 2022-03-07 DIAGNOSIS — N1832 Chronic kidney disease, stage 3b: Secondary | ICD-10-CM | POA: Diagnosis not present

## 2022-03-07 DIAGNOSIS — L89152 Pressure ulcer of sacral region, stage 2: Secondary | ICD-10-CM | POA: Diagnosis not present

## 2022-03-07 DIAGNOSIS — E1122 Type 2 diabetes mellitus with diabetic chronic kidney disease: Secondary | ICD-10-CM | POA: Diagnosis not present

## 2022-03-07 DIAGNOSIS — M159 Polyosteoarthritis, unspecified: Secondary | ICD-10-CM | POA: Diagnosis not present

## 2022-03-12 NOTE — Telephone Encounter (Signed)
Yes advise updated shingrix vaccine  (Previous vaccine was  2007  and effectiveness wanes over time.) Obtain at your pharmacy ; should be npo copay on medicare , no rx should be needed.  Is a series of 2   (can get sore arm and fever chills the next day . )

## 2022-03-14 ENCOUNTER — Telehealth: Payer: Self-pay

## 2022-03-14 NOTE — Telephone Encounter (Signed)
Contact patient in regards to scheduling lab appt. Left a voicemail to call us back.

## 2022-03-18 ENCOUNTER — Ambulatory Visit (INDEPENDENT_AMBULATORY_CARE_PROVIDER_SITE_OTHER)
Admission: RE | Admit: 2022-03-18 | Discharge: 2022-03-18 | Disposition: A | Discharge status: Home / Self Care | Payer: Medicare Other | Source: Ambulatory Visit | Attending: Internal Medicine | Admitting: Internal Medicine

## 2022-03-18 ENCOUNTER — Ambulatory Visit (INDEPENDENT_AMBULATORY_CARE_PROVIDER_SITE_OTHER): Payer: Medicare Other | Admitting: Internal Medicine

## 2022-03-18 ENCOUNTER — Encounter: Payer: Self-pay | Admitting: Internal Medicine

## 2022-03-18 ENCOUNTER — Other Ambulatory Visit (INDEPENDENT_AMBULATORY_CARE_PROVIDER_SITE_OTHER): Payer: Medicare Other

## 2022-03-18 VITALS — BP 140/70 | HR 97 | Temp 97.7°F | Wt 122.6 lb

## 2022-03-18 DIAGNOSIS — L299 Pruritus, unspecified: Secondary | ICD-10-CM | POA: Diagnosis not present

## 2022-03-18 DIAGNOSIS — R0600 Dyspnea, unspecified: Secondary | ICD-10-CM

## 2022-03-18 DIAGNOSIS — G63 Polyneuropathy in diseases classified elsewhere: Secondary | ICD-10-CM | POA: Diagnosis not present

## 2022-03-18 DIAGNOSIS — N1832 Chronic kidney disease, stage 3b: Secondary | ICD-10-CM | POA: Diagnosis not present

## 2022-03-18 DIAGNOSIS — M7989 Other specified soft tissue disorders: Secondary | ICD-10-CM | POA: Diagnosis not present

## 2022-03-18 DIAGNOSIS — D649 Anemia, unspecified: Secondary | ICD-10-CM | POA: Diagnosis not present

## 2022-03-18 DIAGNOSIS — R338 Other retention of urine: Secondary | ICD-10-CM | POA: Diagnosis not present

## 2022-03-18 DIAGNOSIS — E871 Hypo-osmolality and hyponatremia: Secondary | ICD-10-CM | POA: Diagnosis not present

## 2022-03-18 DIAGNOSIS — R0602 Shortness of breath: Secondary | ICD-10-CM | POA: Diagnosis not present

## 2022-03-18 DIAGNOSIS — R053 Chronic cough: Secondary | ICD-10-CM

## 2022-03-18 DIAGNOSIS — Z79899 Other long term (current) drug therapy: Secondary | ICD-10-CM | POA: Diagnosis not present

## 2022-03-18 DIAGNOSIS — R059 Cough, unspecified: Secondary | ICD-10-CM | POA: Diagnosis not present

## 2022-03-18 DIAGNOSIS — J90 Pleural effusion, not elsewhere classified: Secondary | ICD-10-CM | POA: Diagnosis not present

## 2022-03-18 LAB — BASIC METABOLIC PANEL
BUN: 41 mg/dL — ABNORMAL HIGH (ref 6–23)
CO2: 26 mEq/L (ref 19–32)
Calcium: 9.4 mg/dL (ref 8.4–10.5)
Chloride: 88 mEq/L — ABNORMAL LOW (ref 96–112)
Creatinine, Ser: 1.39 mg/dL — ABNORMAL HIGH (ref 0.40–1.20)
GFR: 33.51 mL/min — ABNORMAL LOW (ref >60.00)
Glucose, Bld: 90 mg/dL (ref 70–99)
Potassium: 4.4 mEq/L (ref 3.5–5.1)
Sodium: 122 mEq/L — ABNORMAL LOW (ref 135–145)

## 2022-03-18 LAB — CBC WITH DIFFERENTIAL/PLATELET
Basophils Absolute: 0.1 10*3/uL (ref 0.0–0.1)
Basophils Relative: 1.2 % (ref 0.0–3.0)
Eosinophils Absolute: 0.3 10*3/uL (ref 0.0–0.7)
Eosinophils Relative: 5.3 % — ABNORMAL HIGH (ref 0.0–5.0)
HCT: 31.6 % — ABNORMAL LOW (ref 36.0–46.0)
Hemoglobin: 10.7 g/dL — ABNORMAL LOW (ref 12.0–15.0)
Lymphocytes Relative: 22.4 % (ref 12.0–46.0)
Lymphs Abs: 1.1 10*3/uL (ref 0.7–4.0)
MCHC: 33.8 g/dL (ref 30.0–36.0)
MCV: 82.7 fl (ref 78.0–100.0)
Monocytes Absolute: 0.6 10*3/uL (ref 0.1–1.0)
Monocytes Relative: 12.5 % — ABNORMAL HIGH (ref 3.0–12.0)
Neutro Abs: 2.9 10*3/uL (ref 1.4–7.7)
Neutrophils Relative %: 58.6 % (ref 43.0–77.0)
Platelets: 373 10*3/uL (ref 150.0–400.0)
RBC: 3.82 Mil/uL — ABNORMAL LOW (ref 3.87–5.11)
RDW: 14.9 % (ref 11.5–15.5)
WBC: 4.9 10*3/uL (ref 4.0–10.5)

## 2022-03-18 LAB — POC COVID19 BINAXNOW: SARS Coronavirus 2 Ag: NEGATIVE

## 2022-03-18 LAB — VITAMIN B12: Vitamin B-12: >1500 pg/mL — ABNORMAL HIGH (ref 211–911)

## 2022-03-18 LAB — URIC ACID: Uric Acid, Serum: 9 mg/dL — ABNORMAL HIGH (ref 2.4–7.0)

## 2022-03-18 LAB — HEMOGLOBIN A1C: Hgb A1c MFr Bld: 5.8 % (ref 4.6–6.5)

## 2022-03-18 MED ORDER — AZITHROMYCIN 250 MG PO TABS: ORAL_TABLET | ORAL | 0 refills | Status: AC

## 2022-03-18 NOTE — Progress Notes (Signed)
Chief Complaint  Patient presents with   Shortness of Breath    Pt reports she noticed the sx last night.    Cough    Pt reports productive cough going on for months. Clear phlegms.      HPI: [name] 88 y.o. come in for add on SDA  appt   getting lab bu wanted advice because has had increasing sob since last night  added on to schedule after getting fu labs  Here with daughter [name]  Months of cough  and using zyrtec.  However over the last day or 2  Cough worse  sneezing   most clear .  But feels short of breath   also feels left parasternal lung sx  no hemoptysis   Had urology visit today and had catheter changed and urine specimen .  No swelling  bleeding x hemorrhoid when wipes .  Change in meds?  ROS: See pertinent positives and negatives per HPI. No fever chills hemoptysis . 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 , HER2/neu negative-- s/p right lumpectomy (without SLNB) and radiation therapy   Chronic lower back pain    DDD (degenerative disc disease)    lumbar   Diverticulosis    Family history of breast cancer    Family history of pancreatic cancer    Family history of prostate cancer    Flaccid neuropathic bladder, not elsewhere classified    Foley catheter in place    GERD (gastroesophageal reflux disease)    Hemorrhoids    Hiatal hernia    History of colon polyps    History of radiation therapy 09-12-2015 to 10-12-2015   42.72 gray in 16 fractions directed right breast w/ boost of 12 gray in 6 fractions directed at the lumpectomy cavity- Total dose: 54.72y   History of recurrent UTIs    Hyperlipidemia    RBBB (right bundle branch block with left anterior fascicular block)    Type 2  diabetes mellitus ([place])    Urinary retention with incomplete bladder emptying    Wears dentures    full upper and lower partial   Wears glasses    Wears hearing aid    bilateral but does not wear    Family History  Problem Relation Age of Onset   Pancreatic cancer Mother 60   Diabetes Mother    Prostate cancer Brother    Diabetes Father    Heart disease Father    Diabetes Brother    Heart attack Brother    Diabetes Sister    Diabetes Daughter    Diabetes Son    Coronary artery disease Brother        MI in his 31s   Colon cancer Neg Hx    Stomach cancer Neg Hx     Social History   Socioeconomic History   Marital status: Widowed    Spouse name: [name]   Number of children: 4   Years of education: 12   Highest education level: 12th grade  Occupational History   Occupation: Retired  Tobacco Use   Smoking status: Never   Smokeless tobacco: Never  Vaping Use   Vaping Use:  Never used  Substance and Sexual Activity   Alcohol use: No   Drug use: No   Sexual activity: Never  Other Topics Concern   Not on file  Social History Narrative   HH 2 married   Works for [employer] for 40 years retired   No pets    Lives w spouse; married in 72; 60 years    Social Determinants of Health   Financial Resource Strain: Low Risk  (12/20/2021)   Overall Financial Resource Strain (CARDIA)    Difficulty of Paying Living Expenses: Not hard at all  Food Insecurity: No Food Insecurity (12/20/2021)   Hunger Vital Sign    Worried About Running Out of Food in the Last Year: Never true    [place] in the Last Year: Never true  Transportation Needs: No Transportation Needs (12/20/2021)   PRAPARE - Hydrologist (Medical): No    Lack of Transportation (Non-Medical): No  Physical Activity: Insufficiently Active (12/20/2021)   Exercise Vital Sign    Days of Exercise per Week: 7 days    Minutes of Exercise per Session: 10 min  Stress: No Stress  Concern Present (12/20/2021)   [place]    Feeling of Stress : Not at all  Social Connections: Moderately Integrated (12/20/2021)   Social Connection and Isolation Panel [NHANES]    Frequency of Communication with Friends and Family: More than three times a week    Frequency of Social Gatherings with Friends and Family: More than three times a week    Attends Religious Services: More than 4 times per year    Active Member of [employer] or Organizations: Yes    Attends Archivist Meetings: More than 4 times per year    Marital Status: Widowed    Outpatient Medications Prior to Visit  Medication Sig Dispense Refill   acetaminophen (TYLENOL) 650 MG CR tablet Take 650 mg by mouth every 8 (eight) hours as needed for pain.     ALPRAZolam (XANAX) 1 MG tablet Take 0.5-1 tablets (0.5-1 mg total) by mouth at bedtime as needed for anxiety. Avoid regular use. 24 tablet 0   aspirin 81 MG chewable tablet Chew 1 tablet (81 mg total) by mouth 2 (two) times daily. 35 tablet 0   cholecalciferol (VITAMIN D3) 25 MCG (1000 UNIT) tablet Take 1,000 Units by mouth daily.     colchicine 0.6 MG tablet Take 1 tablet (0.6 mg total) by mouth daily. May increase to twice a day for gout attacks 90 tablet 1   cyanocobalamin 1000 MCG tablet Take 1,000 mcg by mouth at bedtime.      docusate sodium (COLACE) 100 MG capsule Take 1 capsule (100 mg total) by mouth 2 (two) times daily. (Patient taking differently: Take 100 mg by mouth daily.) 60 capsule 0   esomeprazole (NEXIUM) 40 MG capsule TAKE 1 CAPSULE DAILY 90 capsule 1   furosemide (LASIX) 40 MG tablet TAKE 1 TABLET DAILY OR AS DIRECTED 90 tablet 0   glipiZIDE (GLUCOTROL) 10 MG tablet TAKE 1 TABLET IN THE MORNING 90 tablet 1   polyethylene glycol powder (MIRALAX) 17 GM/SCOOP powder Take 1 Container by mouth once.     Trospium Chloride 60 MG CP24 Take 60 mg by mouth daily.     No facility-administered  medications prior to visit.     EXAM:  BP (!) 140/70 (BP Location: Left Arm, Patient Position: Sitting, Cuff Size:  Normal)   Pulse 97   Temp 97.7 F (36.5 C) (Oral)   Wt 122 lb 9.6 oz (55.6 kg)   SpO2 97%   BMI 23.55 kg/m   Body mass index is 23.55 kg/m. Wt Readings from Last 3 Encounters:  03/18/22 122 lb 9.6 oz (55.6 kg)  02/20/22 134 lb (60.8 kg)  01/23/22 134 lb 8 oz (61 kg)    GENERAL: vitals reviewed and listed above, alert, oriented, appears well hydrated and in non toxic    nl speech in WC  HEENT: atraumatic, conjunctiva  clear, no obvious abnormalities on inspection of external nose and ears  NECK: no obvious masses on inspection palpation  LUNGS: bibasilar fine crackles  no wet rales  good air movement no retractions  CV: HRRR, no clubbing cyanosis 1 peripheral edema chronic changes nl cap refill  MS: moves all extremities without noticeable focal  abnormality PSYCH: pleasant and cooperative, no obvious depression or anxiety  Neuro  grossly  non focal  cognition seems intact nl speech  Lab Results  Component Value Date   WBC 4.9 03/18/2022   HGB 10.7 (L) 03/18/2022   HCT 31.6 (L) 03/18/2022   PLT 373.0 03/18/2022   GLUCOSE 90 03/18/2022   CHOL 148 08/22/2021   TRIG 221.0 (H) 08/22/2021   HDL 44.90 08/22/2021   LDLDIRECT 66.0 08/22/2021   LDLCALC 83 12/16/2013   ALT 7 08/22/2021   AST 12 08/22/2021   NA 122 (L) 03/18/2022   K 4.4 03/18/2022   CL 88 (L) 03/18/2022   CREATININE 1.39 (H) 03/18/2022   BUN 41 (H) 03/18/2022   CO2 26 03/18/2022   TSH 4.65 08/22/2021   INR 1.0 12/25/2019   HGBA1C 5.8 03/18/2022   MICROALBUR 1.0 08/22/2021   BP Readings from Last 3 Encounters:  03/18/22 (!) 140/70  01/23/22 110/78  11/19/21 108/60    ASSESSMENT AND PLAN:  Discussed the following assessment and plan:  Dyspnea, unspecified type - new onset for 1-2 days - Plan: DG Chest 2 View  Cough, persistent - predatting  new sx  - Plan: DG Chest 2 View, POC  COVID-19  Hyponatremia - Plan: Basic metabolic panel  Stage 3b chronic kidney disease ([place]) Ordered stat c xray today  [facility]  if can do before closing   consider  empiric rx for pna azithromycin avoiding Doxy and other because of potential side effects diarrhea and Augmentin and potential for aggravation of hyponatremia.  Spoke with daughter Ashley Dy 2 minutes she has been drinking a lot more water because the urologist advised lots of fluids to avoid catheter from getting clogged. Sodium has dropped significantly although her symptoms.  Except for fatigue or   consistent with acute hyponatremia.  Plan water restriction cut water intake in half repeat chemistry tomorrow or Wednesday However this does not explain the dyspnea.  Other consideration cardiac  etc  Low threshold to seek ED care.  Daughter aware.  Alarm symptoms discussed. Review time  fu  plan 60 minutes  Patient Instructions  Chest  sounds like a few crackles   We  can add antibiotic  in short run  in case infection  And then  get chest x ray  today at [facility]   Mariann Laster K. Jalon Blackwelder M.D.

## 2022-03-18 NOTE — Patient Instructions (Addendum)
Chest  sounds like a few crackles   We  can add antibiotic  in short run  in case infection  And then  get chest x ray  today at [facility]

## 2022-03-19 LAB — IRON,TIBC AND FERRITIN PANEL
%SAT: 16 % (calc) (ref 16–45)
Ferritin: 99 ng/mL (ref 16–288)
Iron: 40 ug/dL — ABNORMAL LOW (ref 45–160)
TIBC: 257 mcg/dL (calc) (ref 250–450)

## 2022-03-19 NOTE — Telephone Encounter (Signed)
Patient had lab appointment yesterday.

## 2022-03-20 ENCOUNTER — Other Ambulatory Visit (INDEPENDENT_AMBULATORY_CARE_PROVIDER_SITE_OTHER): Payer: Medicare Other

## 2022-03-20 DIAGNOSIS — E871 Hypo-osmolality and hyponatremia: Secondary | ICD-10-CM | POA: Diagnosis not present

## 2022-03-20 DIAGNOSIS — R079 Chest pain, unspecified: Secondary | ICD-10-CM

## 2022-03-20 DIAGNOSIS — R0602 Shortness of breath: Secondary | ICD-10-CM

## 2022-03-20 LAB — BASIC METABOLIC PANEL
BUN: 45 mg/dL — ABNORMAL HIGH (ref 6–23)
CO2: 23 mEq/L (ref 19–32)
Calcium: 9 mg/dL (ref 8.4–10.5)
Chloride: 89 mEq/L — ABNORMAL LOW (ref 96–112)
Creatinine, Ser: 1.39 mg/dL — ABNORMAL HIGH (ref 0.40–1.20)
GFR: 33.51 mL/min — ABNORMAL LOW (ref >60.00)
Glucose, Bld: 170 mg/dL — ABNORMAL HIGH (ref 70–99)
Potassium: 3.9 mEq/L (ref 3.5–5.1)
Sodium: 123 mEq/L — ABNORMAL LOW (ref 135–145)

## 2022-03-20 NOTE — Progress Notes (Signed)
Patient did lab work on Monday 9/11.

## 2022-03-21 ENCOUNTER — Telehealth: Payer: Self-pay | Admitting: Internal Medicine

## 2022-03-21 NOTE — Telephone Encounter (Signed)
Pt's daughter calling in for clarification on the last mychart message sent regarding her mother. Says she cannot understand

## 2022-03-21 NOTE — Progress Notes (Signed)
Sodium still very low although not worse .   How is her shortness of breath ?   I want her to continue to restrict the excess water   Plan more lab  and  endocrinology consult .   Arrange am bmp, serum osmolality , cortisol, ACTH ,  tsh , urine sodium and urine osmolarity:  dx hyponatremia .   And referral  If feeling  badly go to ED for evaluation.

## 2022-03-22 DIAGNOSIS — K5790 Diverticulosis of intestine, part unspecified, without perforation or abscess without bleeding: Secondary | ICD-10-CM | POA: Diagnosis not present

## 2022-03-22 DIAGNOSIS — N1832 Chronic kidney disease, stage 3b: Secondary | ICD-10-CM | POA: Diagnosis not present

## 2022-03-22 DIAGNOSIS — M159 Polyosteoarthritis, unspecified: Secondary | ICD-10-CM | POA: Diagnosis not present

## 2022-03-22 DIAGNOSIS — E1142 Type 2 diabetes mellitus with diabetic polyneuropathy: Secondary | ICD-10-CM | POA: Diagnosis not present

## 2022-03-22 DIAGNOSIS — L89152 Pressure ulcer of sacral region, stage 2: Secondary | ICD-10-CM | POA: Diagnosis not present

## 2022-03-22 DIAGNOSIS — E1122 Type 2 diabetes mellitus with diabetic chronic kidney disease: Secondary | ICD-10-CM | POA: Diagnosis not present

## 2022-03-25 NOTE — Telephone Encounter (Signed)
Spoke to daughter. Message sent to provider. See lab note.

## 2022-03-26 DIAGNOSIS — E559 Vitamin D deficiency, unspecified: Secondary | ICD-10-CM | POA: Diagnosis not present

## 2022-03-26 DIAGNOSIS — I4510 Unspecified right bundle-branch block: Secondary | ICD-10-CM | POA: Diagnosis not present

## 2022-03-26 DIAGNOSIS — N319 Neuromuscular dysfunction of bladder, unspecified: Secondary | ICD-10-CM | POA: Diagnosis not present

## 2022-03-26 DIAGNOSIS — Z4800 Encounter for change or removal of nonsurgical wound dressing: Secondary | ICD-10-CM | POA: Diagnosis not present

## 2022-03-26 DIAGNOSIS — Z791 Long term (current) use of non-steroidal anti-inflammatories (NSAID): Secondary | ICD-10-CM | POA: Diagnosis not present

## 2022-03-26 DIAGNOSIS — Z7984 Long term (current) use of oral hypoglycemic drugs: Secondary | ICD-10-CM | POA: Diagnosis not present

## 2022-03-26 DIAGNOSIS — K449 Diaphragmatic hernia without obstruction or gangrene: Secondary | ICD-10-CM | POA: Diagnosis not present

## 2022-03-26 DIAGNOSIS — E1122 Type 2 diabetes mellitus with diabetic chronic kidney disease: Secondary | ICD-10-CM | POA: Diagnosis not present

## 2022-03-26 DIAGNOSIS — Z87440 Personal history of urinary (tract) infections: Secondary | ICD-10-CM | POA: Diagnosis not present

## 2022-03-26 DIAGNOSIS — Z9350 Unspecified cystostomy status: Secondary | ICD-10-CM | POA: Diagnosis not present

## 2022-03-26 DIAGNOSIS — Z9181 History of falling: Secondary | ICD-10-CM | POA: Diagnosis not present

## 2022-03-26 DIAGNOSIS — K219 Gastro-esophageal reflux disease without esophagitis: Secondary | ICD-10-CM | POA: Diagnosis not present

## 2022-03-26 DIAGNOSIS — M5116 Intervertebral disc disorders with radiculopathy, lumbar region: Secondary | ICD-10-CM | POA: Diagnosis not present

## 2022-03-26 DIAGNOSIS — N1832 Chronic kidney disease, stage 3b: Secondary | ICD-10-CM | POA: Diagnosis not present

## 2022-03-26 DIAGNOSIS — H9190 Unspecified hearing loss, unspecified ear: Secondary | ICD-10-CM | POA: Diagnosis not present

## 2022-03-26 DIAGNOSIS — Z853 Personal history of malignant neoplasm of breast: Secondary | ICD-10-CM | POA: Diagnosis not present

## 2022-03-26 DIAGNOSIS — Z7982 Long term (current) use of aspirin: Secondary | ICD-10-CM | POA: Diagnosis not present

## 2022-03-26 DIAGNOSIS — E785 Hyperlipidemia, unspecified: Secondary | ICD-10-CM | POA: Diagnosis not present

## 2022-03-26 DIAGNOSIS — R600 Localized edema: Secondary | ICD-10-CM | POA: Diagnosis not present

## 2022-03-26 DIAGNOSIS — E039 Hypothyroidism, unspecified: Secondary | ICD-10-CM | POA: Diagnosis not present

## 2022-03-26 DIAGNOSIS — G8929 Other chronic pain: Secondary | ICD-10-CM | POA: Diagnosis not present

## 2022-03-26 DIAGNOSIS — E1142 Type 2 diabetes mellitus with diabetic polyneuropathy: Secondary | ICD-10-CM | POA: Diagnosis not present

## 2022-03-26 DIAGNOSIS — K5790 Diverticulosis of intestine, part unspecified, without perforation or abscess without bleeding: Secondary | ICD-10-CM | POA: Diagnosis not present

## 2022-03-26 DIAGNOSIS — M159 Polyosteoarthritis, unspecified: Secondary | ICD-10-CM | POA: Diagnosis not present

## 2022-03-26 DIAGNOSIS — L89152 Pressure ulcer of sacral region, stage 2: Secondary | ICD-10-CM | POA: Diagnosis not present

## 2022-03-29 DIAGNOSIS — L89152 Pressure ulcer of sacral region, stage 2: Secondary | ICD-10-CM | POA: Diagnosis not present

## 2022-03-29 DIAGNOSIS — E1122 Type 2 diabetes mellitus with diabetic chronic kidney disease: Secondary | ICD-10-CM | POA: Diagnosis not present

## 2022-03-29 DIAGNOSIS — M159 Polyosteoarthritis, unspecified: Secondary | ICD-10-CM | POA: Diagnosis not present

## 2022-03-29 DIAGNOSIS — K5790 Diverticulosis of intestine, part unspecified, without perforation or abscess without bleeding: Secondary | ICD-10-CM | POA: Diagnosis not present

## 2022-03-29 DIAGNOSIS — E1142 Type 2 diabetes mellitus with diabetic polyneuropathy: Secondary | ICD-10-CM | POA: Diagnosis not present

## 2022-03-29 DIAGNOSIS — N1832 Chronic kidney disease, stage 3b: Secondary | ICD-10-CM | POA: Diagnosis not present

## 2022-03-30 NOTE — Progress Notes (Signed)
Please place the order for endocrinology referral I thought this was done if they can get her in soon blood work anytime Also please order echocardiogram diagnosis dyspnea mitral valve calcification Chest CT dx shortness of breath scarring on chest x ray

## 2022-04-01 ENCOUNTER — Other Ambulatory Visit: Payer: Self-pay | Admitting: Internal Medicine

## 2022-04-01 MED ORDER — ALPRAZOLAM 1 MG PO TABS: 0.5000 mg | ORAL_TABLET | Freq: Every evening | ORAL | 0 refills | Status: DC | PRN

## 2022-04-03 ENCOUNTER — Telehealth: Payer: Self-pay | Admitting: Internal Medicine

## 2022-04-03 NOTE — Telephone Encounter (Signed)
error 

## 2022-04-04 ENCOUNTER — Telehealth: Payer: Self-pay

## 2022-04-04 ENCOUNTER — Telehealth: Payer: Self-pay | Admitting: Internal Medicine

## 2022-04-04 DIAGNOSIS — N1832 Chronic kidney disease, stage 3b: Secondary | ICD-10-CM | POA: Diagnosis not present

## 2022-04-04 DIAGNOSIS — E1142 Type 2 diabetes mellitus with diabetic polyneuropathy: Secondary | ICD-10-CM | POA: Diagnosis not present

## 2022-04-04 DIAGNOSIS — L89152 Pressure ulcer of sacral region, stage 2: Secondary | ICD-10-CM | POA: Diagnosis not present

## 2022-04-04 DIAGNOSIS — E1122 Type 2 diabetes mellitus with diabetic chronic kidney disease: Secondary | ICD-10-CM | POA: Diagnosis not present

## 2022-04-04 DIAGNOSIS — K5790 Diverticulosis of intestine, part unspecified, without perforation or abscess without bleeding: Secondary | ICD-10-CM | POA: Diagnosis not present

## 2022-04-04 DIAGNOSIS — M159 Polyosteoarthritis, unspecified: Secondary | ICD-10-CM | POA: Diagnosis not present

## 2022-04-04 NOTE — Telephone Encounter (Signed)
In past 2 weeks heart rate has been over 90 bmp. This week 107-108 gets winded when she walks short distances. 2 weeks ago 115. [place] was giving her metroprolol but PCP stopped it.

## 2022-04-04 NOTE — Chronic Care Management (AMB) (Signed)
  Care Coordination  Outreach Note  04/04/2022 Name: [name] MRN: [id number] DOB: 1930/03/18   Care Coordination Outreach Attempts: An unsuccessful telephone outreach was attempted today to offer the patient information about available care coordination services as a benefit of their health plan.   Follow Up Plan:  Additional outreach attempts will be made to offer the patient care coordination information and services.   Encounter Outcome:  No Answer  Sig Noreene Larsson, [place], [place] [address] Direct Dial: [phone] [email or web address]

## 2022-04-08 ENCOUNTER — Other Ambulatory Visit (INDEPENDENT_AMBULATORY_CARE_PROVIDER_SITE_OTHER): Payer: Medicare Other

## 2022-04-08 ENCOUNTER — Encounter: Payer: Self-pay | Admitting: Internal Medicine

## 2022-04-08 ENCOUNTER — Ambulatory Visit (INDEPENDENT_AMBULATORY_CARE_PROVIDER_SITE_OTHER): Payer: Medicare Other

## 2022-04-08 DIAGNOSIS — E871 Hypo-osmolality and hyponatremia: Secondary | ICD-10-CM

## 2022-04-08 DIAGNOSIS — Z23 Encounter for immunization: Secondary | ICD-10-CM | POA: Diagnosis not present

## 2022-04-08 LAB — TSH: TSH: 4.59 u[IU]/mL (ref 0.35–5.50)

## 2022-04-08 LAB — CORTISOL: Cortisol, Plasma: 13.9 ug/dL

## 2022-04-08 LAB — BASIC METABOLIC PANEL
BUN: 36 mg/dL — ABNORMAL HIGH (ref 6–23)
CO2: 25 mEq/L (ref 19–32)
Calcium: 9.4 mg/dL (ref 8.4–10.5)
Chloride: 92 mEq/L — ABNORMAL LOW (ref 96–112)
Creatinine, Ser: 1.8 mg/dL — ABNORMAL HIGH (ref 0.40–1.20)
GFR: 24.56 mL/min — ABNORMAL LOW (ref >60.00)
Glucose, Bld: 138 mg/dL — ABNORMAL HIGH (ref 70–99)
Potassium: 4.1 mEq/L (ref 3.5–5.1)
Sodium: 127 mEq/L — ABNORMAL LOW (ref 135–145)

## 2022-04-10 DIAGNOSIS — E1122 Type 2 diabetes mellitus with diabetic chronic kidney disease: Secondary | ICD-10-CM | POA: Diagnosis not present

## 2022-04-10 DIAGNOSIS — M159 Polyosteoarthritis, unspecified: Secondary | ICD-10-CM | POA: Diagnosis not present

## 2022-04-10 DIAGNOSIS — L89152 Pressure ulcer of sacral region, stage 2: Secondary | ICD-10-CM | POA: Diagnosis not present

## 2022-04-10 DIAGNOSIS — N1832 Chronic kidney disease, stage 3b: Secondary | ICD-10-CM | POA: Diagnosis not present

## 2022-04-10 DIAGNOSIS — K5790 Diverticulosis of intestine, part unspecified, without perforation or abscess without bleeding: Secondary | ICD-10-CM | POA: Diagnosis not present

## 2022-04-10 DIAGNOSIS — E1142 Type 2 diabetes mellitus with diabetic polyneuropathy: Secondary | ICD-10-CM | POA: Diagnosis not present

## 2022-04-11 LAB — ACTH: C206 ACTH: 25 pg/mL (ref 6–50)

## 2022-04-11 LAB — SODIUM, URINE, RANDOM

## 2022-04-11 LAB — OSMOLALITY: Osmolality: 285 mOsm/kg (ref 278–305)

## 2022-04-15 ENCOUNTER — Other Ambulatory Visit: Payer: Self-pay | Admitting: Internal Medicine

## 2022-04-15 DIAGNOSIS — E119 Type 2 diabetes mellitus without complications: Secondary | ICD-10-CM

## 2022-04-15 DIAGNOSIS — R338 Other retention of urine: Secondary | ICD-10-CM | POA: Diagnosis not present

## 2022-04-15 NOTE — Telephone Encounter (Signed)
Pt has appt with cardiology on 04/19/22. Will send to PCP for review.

## 2022-04-17 DIAGNOSIS — R928 Other abnormal and inconclusive findings on diagnostic imaging of breast: Secondary | ICD-10-CM | POA: Diagnosis not present

## 2022-04-17 DIAGNOSIS — R922 Inconclusive mammogram: Secondary | ICD-10-CM | POA: Diagnosis not present

## 2022-04-17 LAB — HM MAMMOGRAPHY

## 2022-04-18 DIAGNOSIS — L89152 Pressure ulcer of sacral region, stage 2: Secondary | ICD-10-CM | POA: Diagnosis not present

## 2022-04-18 DIAGNOSIS — N1832 Chronic kidney disease, stage 3b: Secondary | ICD-10-CM | POA: Diagnosis not present

## 2022-04-18 DIAGNOSIS — M159 Polyosteoarthritis, unspecified: Secondary | ICD-10-CM | POA: Diagnosis not present

## 2022-04-18 DIAGNOSIS — E1122 Type 2 diabetes mellitus with diabetic chronic kidney disease: Secondary | ICD-10-CM | POA: Diagnosis not present

## 2022-04-18 DIAGNOSIS — K5790 Diverticulosis of intestine, part unspecified, without perforation or abscess without bleeding: Secondary | ICD-10-CM | POA: Diagnosis not present

## 2022-04-18 DIAGNOSIS — E1142 Type 2 diabetes mellitus with diabetic polyneuropathy: Secondary | ICD-10-CM | POA: Diagnosis not present

## 2022-04-19 ENCOUNTER — Ambulatory Visit (INDEPENDENT_AMBULATORY_CARE_PROVIDER_SITE_OTHER): Payer: Medicare Other

## 2022-04-19 DIAGNOSIS — R079 Chest pain, unspecified: Secondary | ICD-10-CM

## 2022-04-19 DIAGNOSIS — R0602 Shortness of breath: Secondary | ICD-10-CM | POA: Diagnosis not present

## 2022-04-19 LAB — ECHOCARDIOGRAM COMPLETE
Area-P 1/2: 6.27 cm2
S' Lateral: 2.77 cm

## 2022-04-22 DIAGNOSIS — H353132 Nonexudative age-related macular degeneration, bilateral, intermediate dry stage: Secondary | ICD-10-CM | POA: Diagnosis not present

## 2022-04-22 DIAGNOSIS — H35033 Hypertensive retinopathy, bilateral: Secondary | ICD-10-CM | POA: Diagnosis not present

## 2022-04-22 DIAGNOSIS — E119 Type 2 diabetes mellitus without complications: Secondary | ICD-10-CM | POA: Diagnosis not present

## 2022-04-22 DIAGNOSIS — H40013 Open angle with borderline findings, low risk, bilateral: Secondary | ICD-10-CM | POA: Diagnosis not present

## 2022-04-23 ENCOUNTER — Other Ambulatory Visit: Payer: Self-pay

## 2022-04-23 ENCOUNTER — Encounter: Payer: Self-pay | Admitting: Internal Medicine

## 2022-04-23 DIAGNOSIS — E871 Hypo-osmolality and hyponatremia: Secondary | ICD-10-CM

## 2022-04-23 DIAGNOSIS — N289 Disorder of kidney and ureter, unspecified: Secondary | ICD-10-CM

## 2022-04-23 DIAGNOSIS — R0602 Shortness of breath: Secondary | ICD-10-CM

## 2022-04-23 DIAGNOSIS — N3000 Acute cystitis without hematuria: Secondary | ICD-10-CM | POA: Diagnosis not present

## 2022-04-23 DIAGNOSIS — I3481 Nonrheumatic mitral (valve) annulus calcification: Secondary | ICD-10-CM

## 2022-04-23 DIAGNOSIS — N312 Flaccid neuropathic bladder, not elsewhere classified: Secondary | ICD-10-CM | POA: Diagnosis not present

## 2022-04-23 NOTE — Progress Notes (Signed)
So echo cardiogram shows some calcification of the valves  mitral valve but heart is pumping ok .    Last  labs showed kidney function and sodium still off .  Was waiting on chest ct scan  but  looks like not going to be done until next week?  I dont remember stopping the metoprolol but if bp  pulse is low would  consider decreasing .  Plan cardiology consult  please place orders to be done asap  dx dyspnea  and mitral valve calcification  and hyponatremia Also repeat bmp   in the next 1-2 weeks for renal insufficieny and hyponatremia

## 2022-04-24 DIAGNOSIS — Z7984 Long term (current) use of oral hypoglycemic drugs: Secondary | ICD-10-CM | POA: Diagnosis not present

## 2022-04-24 DIAGNOSIS — R000 Tachycardia, unspecified: Secondary | ICD-10-CM | POA: Diagnosis not present

## 2022-04-24 DIAGNOSIS — M5116 Intervertebral disc disorders with radiculopathy, lumbar region: Secondary | ICD-10-CM | POA: Diagnosis not present

## 2022-04-24 DIAGNOSIS — E119 Type 2 diabetes mellitus without complications: Secondary | ICD-10-CM | POA: Diagnosis not present

## 2022-04-24 DIAGNOSIS — M79604 Pain in right leg: Secondary | ICD-10-CM | POA: Diagnosis not present

## 2022-04-24 DIAGNOSIS — E860 Dehydration: Secondary | ICD-10-CM | POA: Diagnosis not present

## 2022-04-24 DIAGNOSIS — N390 Urinary tract infection, site not specified: Secondary | ICD-10-CM | POA: Diagnosis not present

## 2022-04-24 DIAGNOSIS — M545 Low back pain, unspecified: Secondary | ICD-10-CM | POA: Diagnosis not present

## 2022-04-24 DIAGNOSIS — Z888 Allergy status to other drugs, medicaments and biological substances status: Secondary | ICD-10-CM | POA: Diagnosis not present

## 2022-04-24 DIAGNOSIS — Z87440 Personal history of urinary (tract) infections: Secondary | ICD-10-CM | POA: Diagnosis not present

## 2022-04-24 DIAGNOSIS — K219 Gastro-esophageal reflux disease without esophagitis: Secondary | ICD-10-CM | POA: Diagnosis not present

## 2022-04-24 DIAGNOSIS — Z853 Personal history of malignant neoplasm of breast: Secondary | ICD-10-CM | POA: Diagnosis not present

## 2022-04-24 DIAGNOSIS — K5790 Diverticulosis of intestine, part unspecified, without perforation or abscess without bleeding: Secondary | ICD-10-CM | POA: Diagnosis not present

## 2022-04-24 DIAGNOSIS — R600 Localized edema: Secondary | ICD-10-CM | POA: Diagnosis not present

## 2022-04-24 DIAGNOSIS — I4510 Unspecified right bundle-branch block: Secondary | ICD-10-CM | POA: Diagnosis not present

## 2022-04-24 DIAGNOSIS — Z7982 Long term (current) use of aspirin: Secondary | ICD-10-CM | POA: Diagnosis not present

## 2022-04-24 DIAGNOSIS — Z79899 Other long term (current) drug therapy: Secondary | ICD-10-CM | POA: Diagnosis not present

## 2022-04-24 DIAGNOSIS — E785 Hyperlipidemia, unspecified: Secondary | ICD-10-CM | POA: Diagnosis not present

## 2022-04-24 DIAGNOSIS — E871 Hypo-osmolality and hyponatremia: Secondary | ICD-10-CM | POA: Diagnosis not present

## 2022-04-24 DIAGNOSIS — I509 Heart failure, unspecified: Secondary | ICD-10-CM | POA: Diagnosis not present

## 2022-04-24 DIAGNOSIS — N319 Neuromuscular dysfunction of bladder, unspecified: Secondary | ICD-10-CM | POA: Diagnosis not present

## 2022-04-24 DIAGNOSIS — R112 Nausea with vomiting, unspecified: Secondary | ICD-10-CM | POA: Diagnosis not present

## 2022-04-24 DIAGNOSIS — E559 Vitamin D deficiency, unspecified: Secondary | ICD-10-CM | POA: Diagnosis not present

## 2022-04-24 DIAGNOSIS — J90 Pleural effusion, not elsewhere classified: Secondary | ICD-10-CM | POA: Diagnosis not present

## 2022-04-24 DIAGNOSIS — H9190 Unspecified hearing loss, unspecified ear: Secondary | ICD-10-CM | POA: Diagnosis not present

## 2022-04-24 DIAGNOSIS — E1122 Type 2 diabetes mellitus with diabetic chronic kidney disease: Secondary | ICD-10-CM | POA: Diagnosis not present

## 2022-04-24 DIAGNOSIS — L89152 Pressure ulcer of sacral region, stage 2: Secondary | ICD-10-CM | POA: Diagnosis not present

## 2022-04-24 DIAGNOSIS — M25561 Pain in right knee: Secondary | ICD-10-CM | POA: Diagnosis not present

## 2022-04-24 DIAGNOSIS — J189 Pneumonia, unspecified organism: Secondary | ICD-10-CM | POA: Diagnosis present

## 2022-04-24 DIAGNOSIS — R531 Weakness: Secondary | ICD-10-CM | POA: Diagnosis not present

## 2022-04-24 DIAGNOSIS — K449 Diaphragmatic hernia without obstruction or gangrene: Secondary | ICD-10-CM | POA: Diagnosis not present

## 2022-04-24 DIAGNOSIS — E1142 Type 2 diabetes mellitus with diabetic polyneuropathy: Secondary | ICD-10-CM | POA: Diagnosis not present

## 2022-04-24 DIAGNOSIS — M159 Polyosteoarthritis, unspecified: Secondary | ICD-10-CM | POA: Diagnosis not present

## 2022-04-24 DIAGNOSIS — Z9350 Unspecified cystostomy status: Secondary | ICD-10-CM | POA: Diagnosis not present

## 2022-04-24 DIAGNOSIS — B965 Pseudomonas (aeruginosa) (mallei) (pseudomallei) as the cause of diseases classified elsewhere: Secondary | ICD-10-CM | POA: Diagnosis not present

## 2022-04-24 DIAGNOSIS — M7631 Iliotibial band syndrome, right leg: Secondary | ICD-10-CM | POA: Diagnosis not present

## 2022-04-24 DIAGNOSIS — T83511A Infection and inflammatory reaction due to indwelling urethral catheter, initial encounter: Secondary | ICD-10-CM | POA: Diagnosis not present

## 2022-04-24 DIAGNOSIS — Z4800 Encounter for change or removal of nonsurgical wound dressing: Secondary | ICD-10-CM | POA: Diagnosis not present

## 2022-04-24 DIAGNOSIS — R0602 Shortness of breath: Secondary | ICD-10-CM | POA: Diagnosis not present

## 2022-04-24 DIAGNOSIS — I110 Hypertensive heart disease with heart failure: Secondary | ICD-10-CM | POA: Diagnosis not present

## 2022-04-24 DIAGNOSIS — Z1152 Encounter for screening for COVID-19: Secondary | ICD-10-CM | POA: Diagnosis not present

## 2022-04-24 DIAGNOSIS — N179 Acute kidney failure, unspecified: Secondary | ICD-10-CM | POA: Diagnosis not present

## 2022-04-24 DIAGNOSIS — E039 Hypothyroidism, unspecified: Secondary | ICD-10-CM | POA: Diagnosis not present

## 2022-04-24 DIAGNOSIS — Z9181 History of falling: Secondary | ICD-10-CM | POA: Diagnosis not present

## 2022-04-24 DIAGNOSIS — R188 Other ascites: Secondary | ICD-10-CM | POA: Diagnosis not present

## 2022-04-24 DIAGNOSIS — M8588 Other specified disorders of bone density and structure, other site: Secondary | ICD-10-CM | POA: Diagnosis not present

## 2022-04-24 DIAGNOSIS — J9811 Atelectasis: Secondary | ICD-10-CM | POA: Diagnosis not present

## 2022-04-24 DIAGNOSIS — G8929 Other chronic pain: Secondary | ICD-10-CM | POA: Diagnosis not present

## 2022-04-24 DIAGNOSIS — Z791 Long term (current) use of non-steroidal anti-inflammatories (NSAID): Secondary | ICD-10-CM | POA: Diagnosis not present

## 2022-04-24 DIAGNOSIS — M79651 Pain in right thigh: Secondary | ICD-10-CM | POA: Diagnosis not present

## 2022-04-24 DIAGNOSIS — N1832 Chronic kidney disease, stage 3b: Secondary | ICD-10-CM | POA: Diagnosis not present

## 2022-04-25 DIAGNOSIS — E039 Hypothyroidism, unspecified: Secondary | ICD-10-CM | POA: Diagnosis not present

## 2022-04-25 DIAGNOSIS — N1832 Chronic kidney disease, stage 3b: Secondary | ICD-10-CM | POA: Diagnosis not present

## 2022-04-25 DIAGNOSIS — L89152 Pressure ulcer of sacral region, stage 2: Secondary | ICD-10-CM | POA: Diagnosis not present

## 2022-04-25 DIAGNOSIS — I4510 Unspecified right bundle-branch block: Secondary | ICD-10-CM | POA: Diagnosis not present

## 2022-04-25 DIAGNOSIS — Z7982 Long term (current) use of aspirin: Secondary | ICD-10-CM | POA: Diagnosis not present

## 2022-04-25 DIAGNOSIS — K449 Diaphragmatic hernia without obstruction or gangrene: Secondary | ICD-10-CM | POA: Diagnosis not present

## 2022-04-25 DIAGNOSIS — E559 Vitamin D deficiency, unspecified: Secondary | ICD-10-CM | POA: Diagnosis not present

## 2022-04-25 DIAGNOSIS — Z87440 Personal history of urinary (tract) infections: Secondary | ICD-10-CM | POA: Diagnosis not present

## 2022-04-25 DIAGNOSIS — Z853 Personal history of malignant neoplasm of breast: Secondary | ICD-10-CM | POA: Diagnosis not present

## 2022-04-25 DIAGNOSIS — N319 Neuromuscular dysfunction of bladder, unspecified: Secondary | ICD-10-CM | POA: Diagnosis not present

## 2022-04-25 DIAGNOSIS — G8929 Other chronic pain: Secondary | ICD-10-CM | POA: Diagnosis not present

## 2022-04-25 DIAGNOSIS — R600 Localized edema: Secondary | ICD-10-CM | POA: Diagnosis not present

## 2022-04-25 DIAGNOSIS — K5790 Diverticulosis of intestine, part unspecified, without perforation or abscess without bleeding: Secondary | ICD-10-CM | POA: Diagnosis not present

## 2022-04-25 DIAGNOSIS — H9190 Unspecified hearing loss, unspecified ear: Secondary | ICD-10-CM | POA: Diagnosis not present

## 2022-04-25 DIAGNOSIS — Z4800 Encounter for change or removal of nonsurgical wound dressing: Secondary | ICD-10-CM | POA: Diagnosis not present

## 2022-04-25 DIAGNOSIS — Z9181 History of falling: Secondary | ICD-10-CM | POA: Diagnosis not present

## 2022-04-25 DIAGNOSIS — K219 Gastro-esophageal reflux disease without esophagitis: Secondary | ICD-10-CM | POA: Diagnosis not present

## 2022-04-25 DIAGNOSIS — E1122 Type 2 diabetes mellitus with diabetic chronic kidney disease: Secondary | ICD-10-CM | POA: Diagnosis not present

## 2022-04-25 DIAGNOSIS — Z7984 Long term (current) use of oral hypoglycemic drugs: Secondary | ICD-10-CM | POA: Diagnosis not present

## 2022-04-25 DIAGNOSIS — Z9350 Unspecified cystostomy status: Secondary | ICD-10-CM | POA: Diagnosis not present

## 2022-04-25 DIAGNOSIS — E1142 Type 2 diabetes mellitus with diabetic polyneuropathy: Secondary | ICD-10-CM | POA: Diagnosis not present

## 2022-04-25 DIAGNOSIS — E785 Hyperlipidemia, unspecified: Secondary | ICD-10-CM | POA: Diagnosis not present

## 2022-04-25 DIAGNOSIS — Z791 Long term (current) use of non-steroidal anti-inflammatories (NSAID): Secondary | ICD-10-CM | POA: Diagnosis not present

## 2022-04-25 DIAGNOSIS — M5116 Intervertebral disc disorders with radiculopathy, lumbar region: Secondary | ICD-10-CM | POA: Diagnosis not present

## 2022-04-25 DIAGNOSIS — M159 Polyosteoarthritis, unspecified: Secondary | ICD-10-CM | POA: Diagnosis not present

## 2022-04-26 ENCOUNTER — Encounter: Payer: Self-pay | Admitting: Internal Medicine

## 2022-05-01 ENCOUNTER — Other Ambulatory Visit: Payer: Medicare Other

## 2022-05-03 NOTE — Telephone Encounter (Signed)
Hope she is doing better. Contact patient about how she is doing .  And plan for follow  up.

## 2022-05-04 DIAGNOSIS — E1142 Type 2 diabetes mellitus with diabetic polyneuropathy: Secondary | ICD-10-CM | POA: Diagnosis not present

## 2022-05-04 DIAGNOSIS — N1832 Chronic kidney disease, stage 3b: Secondary | ICD-10-CM | POA: Diagnosis not present

## 2022-05-04 DIAGNOSIS — E1122 Type 2 diabetes mellitus with diabetic chronic kidney disease: Secondary | ICD-10-CM | POA: Diagnosis not present

## 2022-05-04 DIAGNOSIS — K5790 Diverticulosis of intestine, part unspecified, without perforation or abscess without bleeding: Secondary | ICD-10-CM | POA: Diagnosis not present

## 2022-05-04 DIAGNOSIS — M159 Polyosteoarthritis, unspecified: Secondary | ICD-10-CM | POA: Diagnosis not present

## 2022-05-04 DIAGNOSIS — L89152 Pressure ulcer of sacral region, stage 2: Secondary | ICD-10-CM | POA: Diagnosis not present

## 2022-05-06 ENCOUNTER — Telehealth: Payer: Self-pay

## 2022-05-06 NOTE — Telephone Encounter (Signed)
Transition Care Management Follow-up Telephone Call Date of discharge and from where: [facility] 05-02-22 IF:[id number] How have you been since you were released from the hospital? Doing ok  Any questions or concerns? No  Items Reviewed: Did the pt receive and understand the discharge instructions provided? Yes  Medications obtained and verified? Yes  Other? No  Any new allergies since your discharge? No  Dietary orders reviewed? Yes Do you have support at home? Yes   Home Care and Equipment/Supplies: Were home health services ordered? Yes PT If so, what is the name of the agency? [facility]   Has the agency set up a time to come to the patient's home? yes Were any new equipment or medical supplies ordered?  No What is the name of the medical supply agency? na Were you able to get the supplies/equipment? not applicable Do you have any questions related to the use of the equipment or supplies? na  Functional Questionnaire: (I = Independent and D = Dependent) ADLs: I- WITH ASSISTANCE  Bathing/Dressing- I-WITH ASSISTANCE   Meal Prep- D  Eating- I  Maintaining continence- I  Transferring/Ambulation- I-WALKER  Managing Meds- D  Follow up appointments reviewed:  PCP Hospital f/u appt confirmed? Yes  Scheduled to see Dr Regis Bill on 05-15-22 @ [facility] f/u appt confirmed? No  Are transportation arrangements needed? No  If their condition worsens, is the pt aware to call PCP or go to the Emergency Dept.? Yes Was the patient provided with contact information for the PCP's office or ED? Yes Was to pt encouraged to call back with questions or concerns? Yes   Juanda Crumble LPN [place] Direct Dial [phone]

## 2022-05-07 ENCOUNTER — Inpatient Hospital Stay (HOSPITAL_COMMUNITY)
Admission: EM | Admit: 2022-05-07 | Discharge: 2022-05-10 | DRG: 638 | Disposition: A | Discharge status: Hospice | Payer: Medicare Other | Attending: Internal Medicine | Admitting: Internal Medicine

## 2022-05-07 ENCOUNTER — Other Ambulatory Visit: Payer: Self-pay

## 2022-05-07 ENCOUNTER — Emergency Department (HOSPITAL_COMMUNITY): Payer: Medicare Other

## 2022-05-07 ENCOUNTER — Encounter (HOSPITAL_COMMUNITY): Payer: Self-pay

## 2022-05-07 DIAGNOSIS — Z923 Personal history of irradiation: Secondary | ICD-10-CM | POA: Diagnosis not present

## 2022-05-07 DIAGNOSIS — Z7984 Long term (current) use of oral hypoglycemic drugs: Secondary | ICD-10-CM

## 2022-05-07 DIAGNOSIS — K219 Gastro-esophageal reflux disease without esophagitis: Secondary | ICD-10-CM | POA: Diagnosis present

## 2022-05-07 DIAGNOSIS — Z978 Presence of other specified devices: Secondary | ICD-10-CM | POA: Diagnosis not present

## 2022-05-07 DIAGNOSIS — R188 Other ascites: Secondary | ICD-10-CM | POA: Diagnosis not present

## 2022-05-07 DIAGNOSIS — W19XXXA Unspecified fall, initial encounter: Secondary | ICD-10-CM | POA: Diagnosis present

## 2022-05-07 DIAGNOSIS — Z8701 Personal history of pneumonia (recurrent): Secondary | ICD-10-CM

## 2022-05-07 DIAGNOSIS — Z79899 Other long term (current) drug therapy: Secondary | ICD-10-CM | POA: Diagnosis not present

## 2022-05-07 DIAGNOSIS — A419 Sepsis, unspecified organism: Secondary | ICD-10-CM | POA: Diagnosis not present

## 2022-05-07 DIAGNOSIS — E039 Hypothyroidism, unspecified: Secondary | ICD-10-CM | POA: Diagnosis present

## 2022-05-07 DIAGNOSIS — Z66 Do not resuscitate: Secondary | ICD-10-CM | POA: Diagnosis not present

## 2022-05-07 DIAGNOSIS — E119 Type 2 diabetes mellitus without complications: Secondary | ICD-10-CM | POA: Diagnosis not present

## 2022-05-07 DIAGNOSIS — G8929 Other chronic pain: Secondary | ICD-10-CM | POA: Diagnosis present

## 2022-05-07 DIAGNOSIS — N1832 Chronic kidney disease, stage 3b: Secondary | ICD-10-CM | POA: Diagnosis present

## 2022-05-07 DIAGNOSIS — M7989 Other specified soft tissue disorders: Secondary | ICD-10-CM | POA: Diagnosis not present

## 2022-05-07 DIAGNOSIS — M545 Low back pain, unspecified: Secondary | ICD-10-CM | POA: Diagnosis present

## 2022-05-07 DIAGNOSIS — Z7982 Long term (current) use of aspirin: Secondary | ICD-10-CM

## 2022-05-07 DIAGNOSIS — Z8616 Personal history of COVID-19: Secondary | ICD-10-CM | POA: Diagnosis not present

## 2022-05-07 DIAGNOSIS — Z8543 Personal history of malignant neoplasm of ovary: Secondary | ICD-10-CM | POA: Diagnosis not present

## 2022-05-07 DIAGNOSIS — E785 Hyperlipidemia, unspecified: Secondary | ICD-10-CM | POA: Diagnosis present

## 2022-05-07 DIAGNOSIS — C569 Malignant neoplasm of unspecified ovary: Secondary | ICD-10-CM | POA: Diagnosis not present

## 2022-05-07 DIAGNOSIS — R627 Adult failure to thrive: Secondary | ICD-10-CM | POA: Diagnosis present

## 2022-05-07 DIAGNOSIS — L89152 Pressure ulcer of sacral region, stage 2: Secondary | ICD-10-CM | POA: Diagnosis not present

## 2022-05-07 DIAGNOSIS — Z87440 Personal history of urinary (tract) infections: Secondary | ICD-10-CM

## 2022-05-07 DIAGNOSIS — M1990 Unspecified osteoarthritis, unspecified site: Secondary | ICD-10-CM | POA: Diagnosis present

## 2022-05-07 DIAGNOSIS — E871 Hypo-osmolality and hyponatremia: Secondary | ICD-10-CM | POA: Diagnosis present

## 2022-05-07 DIAGNOSIS — Z8249 Family history of ischemic heart disease and other diseases of the circulatory system: Secondary | ICD-10-CM

## 2022-05-07 DIAGNOSIS — I129 Hypertensive chronic kidney disease with stage 1 through stage 4 chronic kidney disease, or unspecified chronic kidney disease: Secondary | ICD-10-CM | POA: Diagnosis present

## 2022-05-07 DIAGNOSIS — Y92009 Unspecified place in unspecified non-institutional (private) residence as the place of occurrence of the external cause: Secondary | ICD-10-CM | POA: Diagnosis not present

## 2022-05-07 DIAGNOSIS — Z800 Family history of malignant neoplasm of digestive organs: Secondary | ICD-10-CM

## 2022-05-07 DIAGNOSIS — Z960 Presence of urogenital implants: Secondary | ICD-10-CM | POA: Diagnosis present

## 2022-05-07 DIAGNOSIS — C799 Secondary malignant neoplasm of unspecified site: Secondary | ICD-10-CM | POA: Diagnosis present

## 2022-05-07 DIAGNOSIS — C782 Secondary malignant neoplasm of pleura: Secondary | ICD-10-CM | POA: Diagnosis not present

## 2022-05-07 DIAGNOSIS — Z7189 Other specified counseling: Secondary | ICD-10-CM | POA: Diagnosis not present

## 2022-05-07 DIAGNOSIS — Z888 Allergy status to other drugs, medicaments and biological substances status: Secondary | ICD-10-CM

## 2022-05-07 DIAGNOSIS — R54 Age-related physical debility: Secondary | ICD-10-CM | POA: Diagnosis present

## 2022-05-07 DIAGNOSIS — Z515 Encounter for palliative care: Secondary | ICD-10-CM | POA: Diagnosis not present

## 2022-05-07 DIAGNOSIS — E1142 Type 2 diabetes mellitus with diabetic polyneuropathy: Secondary | ICD-10-CM | POA: Diagnosis not present

## 2022-05-07 DIAGNOSIS — K5790 Diverticulosis of intestine, part unspecified, without perforation or abscess without bleeding: Secondary | ICD-10-CM | POA: Diagnosis not present

## 2022-05-07 DIAGNOSIS — J9811 Atelectasis: Secondary | ICD-10-CM | POA: Diagnosis not present

## 2022-05-07 DIAGNOSIS — Z743 Need for continuous supervision: Secondary | ICD-10-CM | POA: Diagnosis not present

## 2022-05-07 DIAGNOSIS — J910 Malignant pleural effusion: Secondary | ICD-10-CM | POA: Diagnosis not present

## 2022-05-07 DIAGNOSIS — Z974 Presence of external hearing-aid: Secondary | ICD-10-CM

## 2022-05-07 DIAGNOSIS — E162 Hypoglycemia, unspecified: Principal | ICD-10-CM | POA: Diagnosis present

## 2022-05-07 DIAGNOSIS — E1122 Type 2 diabetes mellitus with diabetic chronic kidney disease: Secondary | ICD-10-CM | POA: Diagnosis not present

## 2022-05-07 DIAGNOSIS — Z86010 Personal history of colonic polyps: Secondary | ICD-10-CM

## 2022-05-07 DIAGNOSIS — N319 Neuromuscular dysfunction of bladder, unspecified: Secondary | ICD-10-CM | POA: Diagnosis present

## 2022-05-07 DIAGNOSIS — E11649 Type 2 diabetes mellitus with hypoglycemia without coma: Secondary | ICD-10-CM | POA: Diagnosis not present

## 2022-05-07 DIAGNOSIS — J90 Pleural effusion, not elsewhere classified: Secondary | ICD-10-CM | POA: Diagnosis not present

## 2022-05-07 DIAGNOSIS — R630 Anorexia: Secondary | ICD-10-CM | POA: Diagnosis present

## 2022-05-07 DIAGNOSIS — Z833 Family history of diabetes mellitus: Secondary | ICD-10-CM

## 2022-05-07 DIAGNOSIS — S92912A Unspecified fracture of left toe(s), initial encounter for closed fracture: Secondary | ICD-10-CM | POA: Diagnosis present

## 2022-05-07 DIAGNOSIS — R0781 Pleurodynia: Secondary | ICD-10-CM | POA: Diagnosis not present

## 2022-05-07 DIAGNOSIS — R410 Disorientation, unspecified: Secondary | ICD-10-CM | POA: Diagnosis not present

## 2022-05-07 DIAGNOSIS — Z96652 Presence of left artificial knee joint: Secondary | ICD-10-CM | POA: Diagnosis present

## 2022-05-07 DIAGNOSIS — Z8042 Family history of malignant neoplasm of prostate: Secondary | ICD-10-CM

## 2022-05-07 DIAGNOSIS — M159 Polyosteoarthritis, unspecified: Secondary | ICD-10-CM | POA: Diagnosis not present

## 2022-05-07 DIAGNOSIS — Z853 Personal history of malignant neoplasm of breast: Secondary | ICD-10-CM

## 2022-05-07 DIAGNOSIS — R4781 Slurred speech: Secondary | ICD-10-CM | POA: Diagnosis not present

## 2022-05-07 DIAGNOSIS — R404 Transient alteration of awareness: Secondary | ICD-10-CM | POA: Diagnosis not present

## 2022-05-07 DIAGNOSIS — Z96642 Presence of left artificial hip joint: Secondary | ICD-10-CM | POA: Diagnosis present

## 2022-05-07 DIAGNOSIS — I10 Essential (primary) hypertension: Secondary | ICD-10-CM | POA: Diagnosis present

## 2022-05-07 DIAGNOSIS — R000 Tachycardia, unspecified: Secondary | ICD-10-CM | POA: Diagnosis not present

## 2022-05-07 DIAGNOSIS — C786 Secondary malignant neoplasm of retroperitoneum and peritoneum: Secondary | ICD-10-CM | POA: Diagnosis not present

## 2022-05-07 DIAGNOSIS — Z6824 Body mass index (BMI) 24.0-24.9, adult: Secondary | ICD-10-CM

## 2022-05-07 DIAGNOSIS — Z803 Family history of malignant neoplasm of breast: Secondary | ICD-10-CM

## 2022-05-07 DIAGNOSIS — Z436 Encounter for attention to other artificial openings of urinary tract: Secondary | ICD-10-CM | POA: Diagnosis not present

## 2022-05-07 LAB — COMPREHENSIVE METABOLIC PANEL
ALT: 18 U/L (ref 0–44)
AST: 37 U/L (ref 15–41)
Albumin: 2.5 g/dL — ABNORMAL LOW (ref 3.5–5.0)
Alkaline Phosphatase: 84 U/L (ref 38–126)
Anion gap: 13 (ref 5–15)
BUN: 32 mg/dL — ABNORMAL HIGH (ref 8–23)
CO2: 24 mmol/L (ref 22–32)
Calcium: 8.5 mg/dL — ABNORMAL LOW (ref 8.9–10.3)
Chloride: 95 mmol/L — ABNORMAL LOW (ref 98–111)
Creatinine, Ser: 1.46 mg/dL — ABNORMAL HIGH (ref 0.44–1.00)
GFR, Estimated: 34 mL/min — ABNORMAL LOW (ref >60)
Glucose, Bld: 53 mg/dL — ABNORMAL LOW (ref 70–99)
Potassium: 4.6 mmol/L (ref 3.5–5.1)
Sodium: 132 mmol/L — ABNORMAL LOW (ref 135–145)
Total Bilirubin: 0.4 mg/dL (ref 0.3–1.2)
Total Protein: 6.1 g/dL — ABNORMAL LOW (ref 6.5–8.1)

## 2022-05-07 LAB — CBC WITH DIFFERENTIAL/PLATELET
Abs Immature Granulocytes: 0.09 10*3/uL — ABNORMAL HIGH (ref 0.00–0.07)
Basophils Absolute: 0 10*3/uL (ref 0.0–0.1)
Basophils Relative: 0 %
Eosinophils Absolute: 0 10*3/uL (ref 0.0–0.5)
Eosinophils Relative: 0 %
HCT: 31.6 % — ABNORMAL LOW (ref 36.0–46.0)
Hemoglobin: 10.4 g/dL — ABNORMAL LOW (ref 12.0–15.0)
Immature Granulocytes: 1 %
Lymphocytes Relative: 9 %
Lymphs Abs: 0.9 10*3/uL (ref 0.7–4.0)
MCH: 27.7 pg (ref 26.0–34.0)
MCHC: 32.9 g/dL (ref 30.0–36.0)
MCV: 84 fL (ref 80.0–100.0)
Monocytes Absolute: 0.6 10*3/uL (ref 0.1–1.0)
Monocytes Relative: 6 %
Neutro Abs: 8.4 10*3/uL — ABNORMAL HIGH (ref 1.7–7.7)
Neutrophils Relative %: 84 %
Platelets: 356 10*3/uL (ref 150–400)
RBC: 3.76 MIL/uL — ABNORMAL LOW (ref 3.87–5.11)
RDW: 15.7 % — ABNORMAL HIGH (ref 11.5–15.5)
WBC: 10 10*3/uL (ref 4.0–10.5)
nRBC: 0 % (ref 0.0–0.2)

## 2022-05-07 LAB — CBG MONITORING, ED
Glucose-Capillary: 114 mg/dL — ABNORMAL HIGH (ref 70–99)
Glucose-Capillary: 43 mg/dL — CL (ref 70–99)
Glucose-Capillary: 121 mg/dL — ABNORMAL HIGH (ref 70–99)

## 2022-05-07 LAB — LACTIC ACID, PLASMA: Lactic Acid, Venous: 1.6 mmol/L (ref 0.5–1.9)

## 2022-05-07 MED ORDER — ALPRAZOLAM 0.5 MG PO TABS
0.5000 mg | ORAL_TABLET | Freq: Every evening | ORAL | Status: DC | PRN
  Administered 2022-05-07 – 2022-05-09 (×3): 0.5 mg via ORAL
  Filled 2022-05-07 (×2): qty 1
  Filled 2022-05-07: qty 2

## 2022-05-07 MED ORDER — DEXTROSE 50 % IV SOLN
1.0000 | Freq: Once | INTRAVENOUS | Status: AC
  Administered 2022-05-07: 50 mL via INTRAVENOUS
  Filled 2022-05-07: qty 50

## 2022-05-07 MED ORDER — PANTOPRAZOLE SODIUM 40 MG PO TBEC
40.0000 mg | DELAYED_RELEASE_TABLET | Freq: Every day | ORAL | Status: DC
  Administered 2022-05-07 – 2022-05-09 (×3): 40 mg via ORAL
  Filled 2022-05-07 (×3): qty 1

## 2022-05-07 MED ORDER — DEXTROSE 10 % IV SOLN: INTRAVENOUS | Status: DC

## 2022-05-07 NOTE — Assessment & Plan Note (Signed)
Stable. Baseline 1.4-1.8.

## 2022-05-07 NOTE — ED Notes (Signed)
Patient presents to ED with hx of suprapubic catheter.

## 2022-05-07 NOTE — ED Notes (Signed)
To XRAY

## 2022-05-07 NOTE — Assessment & Plan Note (Signed)
Stable

## 2022-05-07 NOTE — Assessment & Plan Note (Signed)
Verified DNR status with pt and her dtr [name].

## 2022-05-07 NOTE — ED Notes (Addendum)
Pt provided orange juice and food. MD notified of CBG. Pt alert and oriented at this time, in no distress.

## 2022-05-07 NOTE — ED Notes (Signed)
2nd lactic not indicated, 1st lactic WNL

## 2022-05-07 NOTE — Assessment & Plan Note (Signed)
Takes glipizide 10 mg qam at home.

## 2022-05-07 NOTE — Assessment & Plan Note (Signed)
Chronic. 

## 2022-05-07 NOTE — Assessment & Plan Note (Signed)
Observation telemetry bed. Continue with D10 infusion. Her continued hypoglycemia Likely due to pt taking her glipizide in face of her CKD stage 3b and poor po intake. Given her advanced age of 26 and CKD stage 3b, pt may need to reduce her dose of glipizide or stop it all together. Would definitely hold her glipizide at discharge until she is able to f/u with her PCP.

## 2022-05-07 NOTE — ED Triage Notes (Signed)
Pt arrived to ED via [facility] for hypoglycemia. Pt was seen by EMS this morning for a fall no injury and CBG of 56. EMS then saw pt this evening for stroke-like s/s of slurred speech and weakness. CBG was 60 at that time. EMS gave 256m of D10 and '4mg'$  zofran. All s/s resolved after that. VSS w/ EMS. 18g R AC

## 2022-05-07 NOTE — H&P (Signed)
History and Physical    [name] MRN:[id number] DOB: 08/06/1929 DOA: 05/07/2022  DOS: the patient was seen and examined on 05/07/2022  PCP: Panosh, Wanda K, MD   Patient coming from: Home  I have personally briefly reviewed patient's old medical records in [facility]  CC: hypoglycemia HPI: 88-year-old white female history of CKD stage IIIb, type 2 diabetes, chronic indwelling Foley, hypertension, acquired hypothyroidism, esophageal reflux, neurogenic bladder, presents to the ER today with continued hypoglycemia.  Patient had an episode of hypoglycemia at home with blood sugars about 60.  She had fallen while trying to get to her bedside commode.  Her daughter found her on the floor.  She not hit her head.  EMS was called to help get the patient up off the floor.  They noted her blood sugar to be 60.  She was given oatmeal and some food.  Patient was not taken to the hospital.  Patient's blood sugar continue be low at home.  Patient was disoriented and not making any sense when she was talking.  Daughter brought her to the ER here at [facility] for evaluation.  Repeat blood sugar was 53.  And then again 43.  She was given an amp of D50.  IV dextrose infusion was started.  Patient takes glipizide 10 mg daily.  She states that she did take her glipizide after EMS had given her something to eat.  She did not realize that she should not take her diabetes medicine when her blood sugar was low.  [facility] hospital[facility] contacted for admission.  Daughter states the patient was admitted to [facility] last month for pneumonia.  She states that the patient had a thoracentesis along with the paracentesis.  DC summary from [place] states pt has atypical cells in her paracentesis fluid. No comment on her thoracentesis fluid. Final pathology report not included in DC summary.    ED Course: repeat CBG continues to be less than 60. Started on D10 infusion.  Review of Systems:  Review of  Systems  Constitutional:  Positive for malaise/fatigue.  HENT: Negative.    Eyes: Negative.   Respiratory: Negative.    Cardiovascular: Negative.   Gastrointestinal:  Positive for heartburn.  Genitourinary:        Chronic indwelling foley  Musculoskeletal:  Positive for back pain.  Skin: Negative.   Neurological:        Confusion while hypoglycemic today.  Endo/Heme/Allergies:        Persistent hypoglycemia  Psychiatric/Behavioral: Negative.    All other systems reviewed and are negative.   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 , HER2/neu negative-- s/p right lumpectomy (without SLNB) and radiation therapy   Chronic lower back pain    DDD (degenerative disc disease)    lumbar   Diverticulosis    Family history of breast cancer    Family history of pancreatic cancer    Family history of prostate cancer    Flaccid neuropathic bladder, not elsewhere classified    Foley catheter in place    GERD (gastroesophageal reflux disease)    Hemorrhoids    Hiatal hernia    History of colon polyps    History of radiation therapy 09-12-2015 to 10-12-2015   42.72 gray   in 16 fractions directed right breast w/ boost of 12 gray in 6 fractions directed at the lumpectomy cavity- Total dose: 54.72y   History of recurrent UTIs    Hyperlipidemia    RBBB (right bundle branch block with left anterior fascicular block)    Type 2 diabetes mellitus (HCC)    Urinary retention with incomplete bladder emptying    Wears dentures    full upper and lower partial   Wears glasses    Wears hearing aid    bilateral but does not wear    Past Surgical History:  Procedure Laterality Date   BACK SURGERY     BREAST LUMPECTOMY WITH RADIOACTIVE SEED LOCALIZATION Right 08/03/2015    Procedure: BREAST LUMPECTOMY WITH RADIOACTIVE SEED LOCALIZATION;  Surgeon: Matthew Wakefield, MD;  Location: [facility];  Service: General;  Laterality: Right;   BREAST SURGERY     CARDIOVASCULAR STRESS TEST  01/14/2012   Low risk nuclear study w/ small mild apical and apical lateral reversible perfusion defect represents a small area of ischemia versus shifting breast artifact/  normal LV funciton and wall motion, ef 86%   CARPAL TUNNEL RELEASE Right 1977   CATARACT EXTRACTION W/ INTRAOCULAR LENS  IMPLANT, BILATERAL Bilateral left 1996/  right 1997   CLOSED RIGHT KNEE MANIPULATION  08/11/2002   post TKA   CYSTOSTOMY N/A 05/17/2016   Procedure: CYSTOSCOPY WITH  SUPRAPUBIC PLACMENT;  Surgeon: Mark Ottelin, MD;  Location: [facility];  Service: Urology;  Laterality: N/A;   EYE SURGERY     GANGLION CYST EXCISION Right 12/09/2001   right palm   HERNIA REPAIR     JOINT REPLACEMENT     KNEE ARTHROSCOPY Right 12/14/2002   w/ Lysis Adhesions   LAMINECTOMY  04/24/2020   Bilateral redo L2-3, L3-4 and L4-5 laminotomy/laminectomy/foraminotomies/medial facetectomy to decompress the bilateral L3, L4 and L5 nerve roots   LUMBAR LAMINECTOMY/DECOMPRESSION MICRODISCECTOMY N/A 03/06/2016   Procedure: CENTRAL DECOMPRESSION L3 - L4 ,L4 - L5;  Surgeon: Ronald Gioffre, MD;  Location: [facility];  Service: Orthopedics;  Laterality: N/A;   SHOULDER HEMI-ARTHROPLASTY Right 02/20/2009   avascular necrosis    TOTAL HIP ARTHROPLASTY Left 06/22/2021   Procedure: LEFT TOTAL HIP ARTHROPLASTY ANTERIOR APPROACH;  Surgeon: Blackman, Christopher Y, MD;  Location: [facility];  Service: Orthopedics;  Laterality: Left;   TOTAL KNEE ARTHROPLASTY Bilateral right 06-23-2002/  left 07-11-2008   UMBILICAL HERNIA REPAIR  1958   VAGINAL HYSTERECTOMY  1975     reports that she has never smoked. She has never used smokeless tobacco. She reports that she does not drink alcohol and does not use drugs.  Allergies   Allergen Reactions   Phenergan [Promethazine]     Slurred speech, acted very confused   Remeron [Mirtazapine] Itching    Family History  Problem Relation Age of Onset   Pancreatic cancer Mother 59   Diabetes Mother    Prostate cancer Brother    Diabetes Father    Heart disease Father    Diabetes Brother    Heart attack Brother    Diabetes Sister    Diabetes Daughter    Diabetes Son    Coronary artery disease Brother        MI in his 50s   Colon cancer Neg Hx    Stomach cancer Neg Hx     Prior to Admission medications   Medication Sig Start Date End Date Taking? Authorizing Provider  acetaminophen (TYLENOL) 650 MG CR tablet Take 650   mg by mouth every 8 (eight) hours as needed for pain.    [provider]  ALPRAZolam (XANAX) 1 MG tablet Take 0.5-1 tablets (0.5-1 mg total) by mouth at bedtime as needed for anxiety. Avoid regular use. 04/01/22   Panosh, Wanda K, MD  aspirin 81 MG chewable tablet Chew 1 tablet (81 mg total) by mouth 2 (two) times daily. 06/25/21   Clark, Gilbert W, PA-C  cholecalciferol (VITAMIN D3) 25 MCG (1000 UNIT) tablet Take 1,000 Units by mouth daily.    [provider]  colchicine 0.6 MG tablet Take 1 tablet (0.6 mg total) by mouth daily. May increase to twice a day for gout attacks 02/20/22   Panosh, Wanda K, MD  cyanocobalamin 1000 MCG tablet Take 1,000 mcg by mouth at bedtime.     [provider]  docusate sodium (COLACE) 100 MG capsule Take 1 capsule (100 mg total) by mouth 2 (two) times daily. Patient taking differently: Take 100 mg by mouth daily. 05/17/20   Love, Pamela S, PA-C  esomeprazole (NEXIUM) 40 MG capsule TAKE 1 CAPSULE DAILY 04/17/22   Panosh, Wanda K, MD  furosemide (LASIX) 40 MG tablet TAKE 1 TABLET DAILY OR AS DIRECTED 02/14/22   Panosh, Wanda K, MD  glipiZIDE (GLUCOTROL) 10 MG tablet TAKE 1 TABLET IN THE MORNING 04/17/22   Panosh, Wanda K, MD  polyethylene glycol powder (MIRALAX) 17 GM/SCOOP powder Take 1 Container  by mouth once.    [provider]  Trospium Chloride 60 MG CP24 Take 60 mg by mouth daily. 10/19/20   [provider]    Physical Exam: Vitals:   05/07/22 1852 05/07/22 2015 05/07/22 2030 05/07/22 2045  BP:  133/73 (!) 146/66 (!) 153/78  Pulse:  100 (!) 102   Resp:  (!) 22 (!) 23 (!) 24  Temp:      TempSrc:      SpO2:  96% 97%   Weight: 56.7 kg     Height: 5' 0.5" (1.537 m)       Physical Exam Vitals and nursing note reviewed.  Constitutional:      General: She is not in acute distress.    Appearance: She is not toxic-appearing.     Comments: Appears chronically ill  HENT:     Head: Normocephalic and atraumatic.     Nose: Nose normal.  Cardiovascular:     Rate and Rhythm: Regular rhythm. Tachycardia present.     Pulses: Normal pulses.  Pulmonary:     Effort: Pulmonary effort is normal.     Breath sounds: Normal breath sounds.  Abdominal:     General: There is no distension.     Tenderness: There is no abdominal tenderness. There is no guarding.  Musculoskeletal:     Comments: Mild/trace pretibial pitting edema  Skin:    General: Skin is warm and dry.     Capillary Refill: Capillary refill takes less than 2 seconds.  Neurological:     General: No focal deficit present.     Mental Status: She is alert.     Comments: Hard of hearing      Labs on Admission: I have personally reviewed following labs and imaging studies  CBC: Recent Labs  Lab 05/07/22 2005  WBC 10.0  NEUTROABS 8.4*  HGB 10.4*  HCT 31.6*  MCV 84.0  PLT 356   Basic Metabolic Panel: Recent Labs  Lab 05/07/22 2005  NA 132*  K 4.6  CL 95*  CO2 24  GLUCOSE 53*    BUN 32*  CREATININE 1.46*  CALCIUM 8.5*   GFR: Estimated Creatinine Clearance: 20.5 mL/min (A) (by C-G formula based on SCr of 1.46 mg/dL (H)). Liver Function Tests: Recent Labs  Lab 05/07/22 2005  AST 37  ALT 18  ALKPHOS 84  BILITOT 0.4  PROT 6.1*  ALBUMIN 2.5*   No results for input(s): "LIPASE",  "AMYLASE" in the last 168 hours. No results for input(s): "AMMONIA" in the last 168 hours. Coagulation Profile: No results for input(s): "INR", "PROTIME" in the last 168 hours. Cardiac Enzymes: No results for input(s): "CKTOTAL", "CKMB", "CKMBINDEX", "TROPONINI", "TROPONINIHS" in the last 168 hours. BNP (last 3 results) No results for input(s): "PROBNP" in the last 8760 hours. HbA1C: No results for input(s): "HGBA1C" in the last 72 hours. CBG: Recent Labs  Lab 05/07/22 1848 05/07/22 2011 05/07/22 2122  GLUCAP 114* 43* 121*   Lipid Profile: No results for input(s): "CHOL", "HDL", "LDLCALC", "TRIG", "CHOLHDL", "LDLDIRECT" in the last 72 hours. Thyroid Function Tests: No results for input(s): "TSH", "T4TOTAL", "FREET4", "T3FREE", "THYROIDAB" in the last 72 hours. Anemia Panel: No results for input(s): "VITAMINB12", "FOLATE", "FERRITIN", "TIBC", "IRON", "RETICCTPCT" in the last 72 hours. Urine analysis:    Component Value Date/Time   COLORURINE YELLOW 03/24/2016 0906   APPEARANCEUR TURBID (A) 03/24/2016 0906   LABSPEC 1.008 03/24/2016 0906   PHURINE 7.0 03/24/2016 0906   GLUCOSEU NEGATIVE 03/24/2016 0906   HGBUR MODERATE (A) 03/24/2016 0906   HGBUR negative 11/15/2009 0000   BILIRUBINUR Negative 02/16/2020 1230   KETONESUR NEGATIVE 03/24/2016 0906   PROTEINUR Positive (A) 02/16/2020 1230   PROTEINUR NEGATIVE 03/24/2016 0906   UROBILINOGEN 0.2 02/16/2020 1230   UROBILINOGEN 0.2 11/15/2009 0000   NITRITE Positive 02/16/2020 1230   NITRITE POSITIVE (A) 03/24/2016 0906   LEUKOCYTESUR Large (3+) (A) 02/16/2020 1230    Radiological Exams on Admission: I have personally reviewed images DG Chest 2 View  Result Date: 05/07/2022 CLINICAL DATA:  Sepsis EXAM: CHEST - 2 VIEW COMPARISON:  04/29/2022 FINDINGS: Lung volumes are small. Small right pleural effusion is present with associated right basilar atelectasis. Lungs are otherwise clear. No pneumothorax. No pleural effusion on the  left. Cardiac size within normal limits. Pulmonary vascularity is normal. Right total shoulder arthroplasty has been performed. Advanced degenerative changes are seen within the left shoulder. Surgical clips are seen within the right breast. IMPRESSION: 1. Small right pleural effusion with associated right basilar atelectasis. Electronically Signed   By: Fidela Salisbury M.D.   On: 05/07/2022 19:50    EKG: My personal interpretation of EKG shows: sinus tachycardia    Assessment/Plan Principal Problem:   Hypoglycemia Active Problems:   Hypothyroidism   GERD (gastroesophageal reflux disease)   Controlled type 2 diabetes mellitus without complication, without long-term current use of insulin (HCC)   Benign essential HTN   Stage 3b chronic kidney disease (HCC) - baseline SCr 1.4-1.8   Neurogenic bladder   Chronic indwelling Foley catheter   DNR (do not resuscitate)/DNI(Do Not Intubate)    Assessment and Plan: * Hypoglycemia Observation telemetry bed. Continue with D10 infusion. Her continued hypoglycemia Likely due to pt taking her glipizide in face of her CKD stage 3b and poor po intake. Given her advanced age of 24 and CKD stage 3b, pt may need to reduce her dose of glipizide or stop it all together. Would definitely hold her glipizide at discharge until she is able to f/u with her PCP.  DNR (do not resuscitate)/DNI(Do Not Intubate) Verified DNR status with  pt and her dtr [name].  Chronic indwelling Foley catheter Chronic.  Neurogenic bladder Chronic.  Stage 3b chronic kidney disease (HCC) - baseline SCr 1.4-1.8 Stable. Baseline 1.4-1.8.  Benign essential HTN Stable.  Controlled type 2 diabetes mellitus without complication, without long-term current use of insulin (HCC) Takes glipizide 10 mg qam at home.  GERD (gastroesophageal reflux disease) Continue protonix 40 mg daily.  Hypothyroidism Stable.   DVT prophylaxis: SQ Heparin Code Status: DNR/DNI(Do NOT Intubate).  Verified with pt and her dtr angela Family Communication: discussed with pt and pt's dtr angela  Disposition Plan: return home  Consults called: none  Admission status: Observation, Telemetry bed   Eric Chen, DO [facility] 05/07/2022, 10:57 PM    

## 2022-05-07 NOTE — Subjective & Objective (Addendum)
CC: hypoglycemia HPI: 88 year old white female history of CKD stage IIIb, type 2 diabetes, chronic indwelling Foley, hypertension, acquired hypothyroidism, esophageal reflux, neurogenic bladder, presents to the ER today with continued hypoglycemia.  Patient had an episode of hypoglycemia at home with blood sugars about 60.  She had fallen while trying to get to her bedside commode.  Her daughter found her on the floor.  She not hit her head.  EMS was called to help get the patient up off the floor.  They noted her blood sugar to be 60.  She was given oatmeal and some food.  Patient was not taken to the hospital.  Patient's blood sugar continue be low at home.  Patient was disoriented and not making any sense when she was talking.  Daughter brought her to the ER here at [facility] for evaluation.  Repeat blood sugar was 53.  And then again 43.  She was given an amp of D50.  IV dextrose infusion was started.  Patient takes glipizide 10 mg daily.  She states that she did take her glipizide after EMS had given her something to eat.  She did not realize that she should not take her diabetes medicine when her blood sugar was low.  [facility] hospitalist contacted for admission.  Daughter states the patient was admitted to [facility] last month for pneumonia.  She states that the patient had a thoracentesis along with the paracentesis.  DC summary from [place] states pt has atypical cells in her paracentesis fluid. No comment on her thoracentesis fluid. Final pathology report not included in DC summary.

## 2022-05-07 NOTE — Assessment & Plan Note (Signed)
Continue protonix 40mg daily.  

## 2022-05-07 NOTE — ED Provider Notes (Signed)
[place] EMERGENCY DEPARTMENT Provider Note   CSN: [id number] Arrival date & time: 05/07/22  1844     History  Chief Complaint  Patient presents with   Hypoglycemia    [name] is a 88 y.o. female.   Hypoglycemia Patient presented with hypoglycemia.  Had been seen this morning and found to have a sugar of 56.  Had confusion.  Sugar improved as did mental status.  This evening had slurred speech and generalized weakness.  Had sugar of 60 at that time.  Reportedly had eaten tonight.  Had been given D10 and some Zofran.  Now feeling generally weak but good speech and normal mental status.  Recent admission to [facility].  Reportedly had fluid taken off her lungs and out of her abdomen.  Had Pseudomonas UTI and pneumonia.  Reportedly had hyponatremia also.  Reportedly has not been doing all that well at home.  Patient is diabetic and states she is on glipizide.    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 , HER2/neu negative-- s/p right lumpectomy (without SLNB) and radiation therapy   Chronic lower back pain    DDD (degenerative disc disease)    lumbar   Diverticulosis    Family history of breast cancer    Family history of pancreatic cancer    Family history of prostate cancer    Flaccid neuropathic bladder, not elsewhere classified    Foley catheter in place    GERD (gastroesophageal reflux disease)    Hemorrhoids    Hiatal hernia    History of colon polyps    History of radiation therapy 09-12-2015 to 10-12-2015   42.72 gray in 16 fractions directed right breast w/ boost of 12 gray in 6 fractions directed at the lumpectomy cavity- Total dose: 54.72y   History of recurrent UTIs    Hyperlipidemia    RBBB (right bundle branch  block with left anterior fascicular block)    Type 2 diabetes mellitus ([place])    Urinary retention with incomplete bladder emptying    Wears dentures    full upper and lower partial   Wears glasses    Wears hearing aid    bilateral but does not wear    Home Medications Prior to Admission medications   Medication Sig Start Date End Date Taking? Authorizing Provider  acetaminophen (TYLENOL) 650 MG CR tablet Take 650 mg by mouth every 8 (eight) hours as needed for pain.    [provider]  ALPRAZolam Duanne Moron) 1 MG tablet Take 0.5-1 tablets (0.5-1 mg total) by mouth at bedtime as needed for anxiety. Avoid regular use. 04/01/22   Panosh, Standley Brooking, MD  aspirin 81 MG chewable tablet Chew 1 tablet (81 mg total) by mouth 2 (two) times daily. 06/25/21   Pete Pelt, PA-C  cholecalciferol (VITAMIN D3) 25 MCG (1000 UNIT) tablet Take 1,000 Units by mouth daily.    [provider]  colchicine 0.6 MG tablet Take 1 tablet (0.6 mg total) by mouth daily. May increase to twice a day for gout attacks 02/20/22   Panosh, Standley Brooking, MD  cyanocobalamin 1000 MCG tablet Take 1,000 mcg by mouth at bedtime.     [provider]  docusate sodium (COLACE) 100  MG capsule Take 1 capsule (100 mg total) by mouth 2 (two) times daily. Patient taking differently: Take 100 mg by mouth daily. 05/17/20   Love, Ivan Anchors, PA-C  esomeprazole (NEXIUM) 40 MG capsule TAKE 1 CAPSULE DAILY 04/17/22   Panosh, Standley Brooking, MD  furosemide (LASIX) 40 MG tablet TAKE 1 TABLET DAILY OR AS DIRECTED 02/14/22   Panosh, Standley Brooking, MD  glipiZIDE (GLUCOTROL) 10 MG tablet TAKE 1 TABLET IN THE MORNING 04/17/22   Panosh, Standley Brooking, MD  polyethylene glycol powder (MIRALAX) 17 GM/SCOOP powder Take 1 Container by mouth once.    [provider]  Trospium Chloride 60 MG CP24 Take 60 mg by mouth daily. 10/19/20   [provider]      Allergies    Phenergan [promethazine] and Remeron [mirtazapine]    Review of Systems    Review of Systems  Physical Exam Updated Vital Signs BP (!) 153/78   Pulse (!) 102   Temp 97.7 F (36.5 C) (Oral)   Resp (!) 24   Ht 5' 0.5" (1.537 m)   Wt 56.7 kg   SpO2 97%   BMI 24.01 kg/m  Physical Exam Vitals and nursing note reviewed.  Eyes:     Pupils: Pupils are equal, round, and reactive to light.  Cardiovascular:     Rate and Rhythm: Regular rhythm.  Pulmonary:     Breath sounds: No wheezing.  Abdominal:     Tenderness: There is no abdominal tenderness.  Musculoskeletal:     Cervical back: Neck supple.     Comments: Only mild edema bilateral lower extremities.  Skin:    General: Skin is warm.     Capillary Refill: Capillary refill takes less than 2 seconds.  Neurological:     Mental Status: She is alert and oriented to person, place, and time.     ED Results / Procedures / Treatments   Labs (all labs ordered are listed, but only abnormal results are displayed) Labs Reviewed  COMPREHENSIVE METABOLIC PANEL - Abnormal; Notable for the following components:      Result Value   Sodium 132 (*)    Chloride 95 (*)    Glucose, Bld 53 (*)    BUN 32 (*)    Creatinine, Ser 1.46 (*)    Calcium 8.5 (*)    Total Protein 6.1 (*)    Albumin 2.5 (*)    GFR, Estimated 34 (*)    All other components within normal limits  CBC WITH DIFFERENTIAL/PLATELET - Abnormal; Notable for the following components:   RBC 3.76 (*)    Hemoglobin 10.4 (*)    HCT 31.6 (*)    RDW 15.7 (*)    Neutro Abs 8.4 (*)    Abs Immature Granulocytes 0.09 (*)    All other components within normal limits  CBG MONITORING, ED - Abnormal; Notable for the following components:   Glucose-Capillary 114 (*)    All other components within normal limits  CBG MONITORING, ED - Abnormal; Notable for the following components:   Glucose-Capillary 43 (*)    All other components within normal limits  CBG MONITORING, ED - Abnormal; Notable for the following components:   Glucose-Capillary 121 (*)    All  other components within normal limits  LACTIC ACID, PLASMA  LACTIC ACID, PLASMA  URINALYSIS, ROUTINE W REFLEX MICROSCOPIC    EKG None  Radiology DG Chest 2 View  Result Date: 05/07/2022 CLINICAL DATA:  Sepsis EXAM: CHEST - 2 VIEW COMPARISON:  04/29/2022  FINDINGS: Lung volumes are small. Small right pleural effusion is present with associated right basilar atelectasis. Lungs are otherwise clear. No pneumothorax. No pleural effusion on the left. Cardiac size within normal limits. Pulmonary vascularity is normal. Right total shoulder arthroplasty has been performed. Advanced degenerative changes are seen within the left shoulder. Surgical clips are seen within the right breast. IMPRESSION: 1. Small right pleural effusion with associated right basilar atelectasis. Electronically Signed   By: Fidela Salisbury M.D.   On: 05/07/2022 19:50    Procedures Procedures    Medications Ordered in ED Medications  dextrose 10 % infusion ( Intravenous New Bag/Given 05/07/22 2028)  dextrose 50 % solution 50 mL (50 mLs Intravenous Given 05/07/22 2024)         ED Course/ Medical Decision Making/ A&P                           Medical Decision Making Amount and/or Complexity of Data Reviewed Labs: ordered. Radiology: ordered.  Risk Prescription drug management.   Patient awake and pleasant.  Reportedly had some confusion weakness home.  Hypoglycemic both this morning and again this evening.  Reportedly had been eating.  Differential diagnosis includes several different conditions.  Does include dehydration, acute kidney injury, infection.  Will get chest x-ray basic blood work urinalysis and EKG.  We will continue to monitor. Had another episode of hypoglycemia in the ER.  Sugar down to 42.  Given D50 and started on D10 drip.  More oral intake given.  Had already eaten prior to arrival also.  Lab work overall reassuring.  Creatinine at 1.5 which appears to be at baseline.  Normal white count.  I  think will require admission to hospital for monitoring.  Has indwelling catheter.  Has had recent UTI but still pending on urine.        Final Clinical Impression(s) / ED Diagnoses Final diagnoses:  Hypoglycemia    Rx / DC Orders ED Discharge Orders     None         Davonna Belling, MD 05/07/22 2210

## 2022-05-08 DIAGNOSIS — N319 Neuromuscular dysfunction of bladder, unspecified: Secondary | ICD-10-CM | POA: Diagnosis present

## 2022-05-08 DIAGNOSIS — S92912A Unspecified fracture of left toe(s), initial encounter for closed fracture: Secondary | ICD-10-CM | POA: Diagnosis present

## 2022-05-08 DIAGNOSIS — E039 Hypothyroidism, unspecified: Secondary | ICD-10-CM | POA: Diagnosis present

## 2022-05-08 DIAGNOSIS — E119 Type 2 diabetes mellitus without complications: Secondary | ICD-10-CM | POA: Diagnosis not present

## 2022-05-08 DIAGNOSIS — J910 Malignant pleural effusion: Secondary | ICD-10-CM

## 2022-05-08 DIAGNOSIS — Z8701 Personal history of pneumonia (recurrent): Secondary | ICD-10-CM | POA: Diagnosis not present

## 2022-05-08 DIAGNOSIS — Z515 Encounter for palliative care: Secondary | ICD-10-CM | POA: Diagnosis not present

## 2022-05-08 DIAGNOSIS — R54 Age-related physical debility: Secondary | ICD-10-CM | POA: Diagnosis present

## 2022-05-08 DIAGNOSIS — C782 Secondary malignant neoplasm of pleura: Secondary | ICD-10-CM | POA: Diagnosis not present

## 2022-05-08 DIAGNOSIS — I129 Hypertensive chronic kidney disease with stage 1 through stage 4 chronic kidney disease, or unspecified chronic kidney disease: Secondary | ICD-10-CM | POA: Diagnosis present

## 2022-05-08 DIAGNOSIS — E1122 Type 2 diabetes mellitus with diabetic chronic kidney disease: Secondary | ICD-10-CM | POA: Diagnosis present

## 2022-05-08 DIAGNOSIS — Z7189 Other specified counseling: Secondary | ICD-10-CM | POA: Diagnosis not present

## 2022-05-08 DIAGNOSIS — R627 Adult failure to thrive: Secondary | ICD-10-CM | POA: Diagnosis present

## 2022-05-08 DIAGNOSIS — Z978 Presence of other specified devices: Secondary | ICD-10-CM | POA: Diagnosis not present

## 2022-05-08 DIAGNOSIS — E11649 Type 2 diabetes mellitus with hypoglycemia without coma: Secondary | ICD-10-CM | POA: Diagnosis present

## 2022-05-08 DIAGNOSIS — C569 Malignant neoplasm of unspecified ovary: Secondary | ICD-10-CM | POA: Diagnosis not present

## 2022-05-08 DIAGNOSIS — Z66 Do not resuscitate: Secondary | ICD-10-CM | POA: Diagnosis present

## 2022-05-08 DIAGNOSIS — Z79899 Other long term (current) drug therapy: Secondary | ICD-10-CM | POA: Diagnosis not present

## 2022-05-08 DIAGNOSIS — Z923 Personal history of irradiation: Secondary | ICD-10-CM | POA: Diagnosis not present

## 2022-05-08 DIAGNOSIS — Z803 Family history of malignant neoplasm of breast: Secondary | ICD-10-CM | POA: Diagnosis not present

## 2022-05-08 DIAGNOSIS — Z8543 Personal history of malignant neoplasm of ovary: Secondary | ICD-10-CM | POA: Diagnosis not present

## 2022-05-08 DIAGNOSIS — W19XXXA Unspecified fall, initial encounter: Secondary | ICD-10-CM | POA: Diagnosis present

## 2022-05-08 DIAGNOSIS — Z8616 Personal history of COVID-19: Secondary | ICD-10-CM | POA: Diagnosis not present

## 2022-05-08 DIAGNOSIS — R0781 Pleurodynia: Secondary | ICD-10-CM | POA: Diagnosis not present

## 2022-05-08 DIAGNOSIS — N1832 Chronic kidney disease, stage 3b: Secondary | ICD-10-CM | POA: Diagnosis present

## 2022-05-08 DIAGNOSIS — M7989 Other specified soft tissue disorders: Secondary | ICD-10-CM | POA: Diagnosis not present

## 2022-05-08 DIAGNOSIS — K219 Gastro-esophageal reflux disease without esophagitis: Secondary | ICD-10-CM | POA: Diagnosis present

## 2022-05-08 DIAGNOSIS — Z436 Encounter for attention to other artificial openings of urinary tract: Secondary | ICD-10-CM | POA: Diagnosis not present

## 2022-05-08 DIAGNOSIS — R188 Other ascites: Secondary | ICD-10-CM | POA: Diagnosis not present

## 2022-05-08 DIAGNOSIS — C799 Secondary malignant neoplasm of unspecified site: Secondary | ICD-10-CM | POA: Diagnosis present

## 2022-05-08 DIAGNOSIS — C786 Secondary malignant neoplasm of retroperitoneum and peritoneum: Secondary | ICD-10-CM | POA: Diagnosis not present

## 2022-05-08 DIAGNOSIS — Y92009 Unspecified place in unspecified non-institutional (private) residence as the place of occurrence of the external cause: Secondary | ICD-10-CM | POA: Diagnosis not present

## 2022-05-08 DIAGNOSIS — Z800 Family history of malignant neoplasm of digestive organs: Secondary | ICD-10-CM | POA: Diagnosis not present

## 2022-05-08 DIAGNOSIS — Z87440 Personal history of urinary (tract) infections: Secondary | ICD-10-CM | POA: Diagnosis not present

## 2022-05-08 DIAGNOSIS — E162 Hypoglycemia, unspecified: Secondary | ICD-10-CM | POA: Diagnosis present

## 2022-05-08 DIAGNOSIS — I10 Essential (primary) hypertension: Secondary | ICD-10-CM | POA: Diagnosis not present

## 2022-05-08 DIAGNOSIS — E871 Hypo-osmolality and hyponatremia: Secondary | ICD-10-CM | POA: Diagnosis present

## 2022-05-08 DIAGNOSIS — E785 Hyperlipidemia, unspecified: Secondary | ICD-10-CM | POA: Diagnosis present

## 2022-05-08 LAB — BASIC METABOLIC PANEL
Anion gap: 13 (ref 5–15)
BUN: 31 mg/dL — ABNORMAL HIGH (ref 8–23)
CO2: 24 mmol/L (ref 22–32)
Calcium: 8.4 mg/dL — ABNORMAL LOW (ref 8.9–10.3)
Chloride: 94 mmol/L — ABNORMAL LOW (ref 98–111)
Creatinine, Ser: 1.44 mg/dL — ABNORMAL HIGH (ref 0.44–1.00)
GFR, Estimated: 35 mL/min — ABNORMAL LOW (ref >60)
Glucose, Bld: 84 mg/dL (ref 70–99)
Potassium: 4.5 mmol/L (ref 3.5–5.1)
Sodium: 131 mmol/L — ABNORMAL LOW (ref 135–145)

## 2022-05-08 LAB — CBG MONITORING, ED
Glucose-Capillary: 109 mg/dL — ABNORMAL HIGH (ref 70–99)
Glucose-Capillary: 134 mg/dL — ABNORMAL HIGH (ref 70–99)
Glucose-Capillary: 136 mg/dL — ABNORMAL HIGH (ref 70–99)
Glucose-Capillary: 139 mg/dL — ABNORMAL HIGH (ref 70–99)
Glucose-Capillary: 141 mg/dL — ABNORMAL HIGH (ref 70–99)
Glucose-Capillary: 141 mg/dL — ABNORMAL HIGH (ref 70–99)
Glucose-Capillary: 238 mg/dL — ABNORMAL HIGH (ref 70–99)
Glucose-Capillary: 246 mg/dL — ABNORMAL HIGH (ref 70–99)
Glucose-Capillary: 56 mg/dL — ABNORMAL LOW (ref 70–99)
Glucose-Capillary: 91 mg/dL (ref 70–99)

## 2022-05-08 LAB — GLUCOSE, CAPILLARY
Glucose-Capillary: 135 mg/dL — ABNORMAL HIGH (ref 70–99)
Glucose-Capillary: 137 mg/dL — ABNORMAL HIGH (ref 70–99)
Glucose-Capillary: 221 mg/dL — ABNORMAL HIGH (ref 70–99)
Glucose-Capillary: 236 mg/dL — ABNORMAL HIGH (ref 70–99)

## 2022-05-08 LAB — HEMOGLOBIN A1C
Hgb A1c MFr Bld: 5.2 % (ref 4.8–5.6)
Mean Plasma Glucose: 102.54 mg/dL

## 2022-05-08 MED ORDER — DEXTROSE-NACL 10-0.45 % IV SOLN
INTRAVENOUS | Status: DC
  Filled 2022-05-08 (×2): qty 1000

## 2022-05-08 MED ORDER — INSULIN ASPART 100 UNIT/ML IJ SOLN
0.0000 [IU] | Freq: Three times a day (TID) | INTRAMUSCULAR | Status: DC
  Administered 2022-05-08: 2 [IU] via SUBCUTANEOUS

## 2022-05-08 MED ORDER — ACETAMINOPHEN 325 MG PO TABS
650.0000 mg | ORAL_TABLET | Freq: Four times a day (QID) | ORAL | Status: DC | PRN
  Administered 2022-05-09 (×2): 650 mg via ORAL
  Filled 2022-05-08 (×2): qty 2

## 2022-05-08 MED ORDER — FUROSEMIDE 40 MG PO TABS
40.0000 mg | ORAL_TABLET | Freq: Every day | ORAL | Status: DC
  Administered 2022-05-08 – 2022-05-10 (×3): 40 mg via ORAL
  Filled 2022-05-08: qty 2
  Filled 2022-05-08 (×2): qty 1

## 2022-05-08 MED ORDER — ACETAMINOPHEN 650 MG RE SUPP: 650.0000 mg | Freq: Four times a day (QID) | RECTAL | Status: DC | PRN

## 2022-05-08 MED ORDER — ONDANSETRON HCL 4 MG/2ML IJ SOLN
4.0000 mg | Freq: Four times a day (QID) | INTRAMUSCULAR | Status: DC | PRN
  Administered 2022-05-08 (×2): 4 mg via INTRAVENOUS
  Filled 2022-05-08 (×2): qty 2

## 2022-05-08 MED ORDER — LACTATED RINGERS IV SOLN: INTRAVENOUS | Status: DC

## 2022-05-08 MED ORDER — HEPARIN SODIUM (PORCINE) 5000 UNIT/ML IJ SOLN
5000.0000 [IU] | Freq: Three times a day (TID) | INTRAMUSCULAR | Status: DC
  Administered 2022-05-08 – 2022-05-10 (×7): 5000 [IU] via SUBCUTANEOUS
  Filled 2022-05-08 (×7): qty 1

## 2022-05-08 MED ORDER — OXYBUTYNIN CHLORIDE 5 MG PO TABS
5.0000 mg | ORAL_TABLET | Freq: Two times a day (BID) | ORAL | Status: DC
  Administered 2022-05-08 – 2022-05-10 (×4): 5 mg via ORAL
  Filled 2022-05-08 (×4): qty 1

## 2022-05-08 MED ORDER — TROSPIUM CHLORIDE ER 60 MG PO CP24: 60.0000 mg | ORAL_CAPSULE | Freq: Every day | ORAL | Status: DC

## 2022-05-08 MED ORDER — MELATONIN 5 MG PO TABS
10.0000 mg | ORAL_TABLET | Freq: Every evening | ORAL | Status: DC | PRN
  Administered 2022-05-08 (×2): 10 mg via ORAL
  Filled 2022-05-08 (×3): qty 2

## 2022-05-08 NOTE — Evaluation (Signed)
Physical Therapy Evaluation Patient Details Name: [name] MRN: [id number] DOB: [birth year] Today's Date: 05/08/2022  History of Present Illness  88 year old white female history of CKD stage IIIb, type 2 diabetes, chronic indwelling Foley, hypertension, acquired hypothyroidism, esophageal reflux, neurogenic bladder, admitted with continued hypoglycemia after fall at home.  She was recently hospitalized at [facility] due to CAP.  Clinical Impression  Patient presents with decreased mobility due to deficits listed in PT problem list.  Currently min A for mobilizing in room in the ED with HR up to 124 and lots of  productive coughing.  She has support from her daughter and mobilizes with a walker at baseline completing all of her ADL's and managing meds on her own.  Feel she will benefit from skilled PT in the acute setting to allow return home with family support and follow up HHPT.      Recommendations for follow up therapy are one component of a multi-disciplinary discharge planning process, led by the attending physician.  Recommendations may be updated based on patient status, additional functional criteria and insurance authorization.  Follow Up Recommendations Home health PT      Assistance Recommended at Discharge Frequent or constant Supervision/Assistance  Patient can return home with the following  A little help with walking and/or transfers;Assistance with cooking/housework;Assist for transportation;Help with stairs or ramp for entrance;A little help with bathing/dressing/bathroom    Equipment Recommendations None recommended by PT  Recommendations for Other Services       Functional Status Assessment       Precautions / Restrictions Precautions Precautions: Fall Precaution Comments: watch HR      Mobility  Bed Mobility Overal bed mobility: Needs Assistance Bed Mobility: Supine to Sit     Supine to sit: Min assist     General bed mobility comments: for trunk to  sit up on edge of stretcher    Transfers Overall transfer level: Needs assistance Equipment used: Rolling walker (2 wheels) Transfers: Sit to/from Stand Sit to Stand: Min assist           General transfer comment: to stand from tall stretcher, assist for balance and safety    Ambulation/Gait Ambulation/Gait assistance: Min guard Gait Distance (Feet): 12 Feet Assistive device: Rolling walker (2 wheels) Gait Pattern/deviations: Step-to pattern, Decreased stride length, Shuffle       General Gait Details: mobilized in room only due to HR in 120's and pt sitting edge of stretcher coughing and expectorating phlegm for several minutes.  Stairs            Wheelchair Mobility    Modified Rankin (Stroke Patients Only)       Balance Overall balance assessment: Needs assistance Sitting-balance support: Feet unsupported Sitting balance-Leahy Scale: Good Sitting balance - Comments: on edge of stretcher for extended time coughing and expectorating phlegm   Standing balance support: Bilateral upper extremity supported Standing balance-Leahy Scale: Poor Standing balance comment: UE support for standing                             Pertinent Vitals/Pain Pain Assessment Pain Assessment: 0-10 Pain Score: 6  Pain Location: butt and abdomen Pain Descriptors / Indicators: Grimacing, Guarding, Aching Pain Intervention(s): Monitored during session    Home Living Family/patient expects to be discharged to:: Private residence Living Arrangements: Children Available Help at Discharge: Family Type of Home: House Home Access: Ramped entrance       Home Layout: One level  Home Equipment: Conservation officer, nature (2 wheels) Additional Comments: sponge bathes at home on her own; daughter helps with meals, finances, pt does her own meds    Prior Function Prior Level of Function : Independent/Modified Independent                     Hand Dominance   Dominant Hand:  Right    Extremity/Trunk Assessment   Upper Extremity Assessment Upper Extremity Assessment: Generalized weakness;RUE deficits/detail RUE Deficits / Details: limited shoulder elevation due to pain    Lower Extremity Assessment Lower Extremity Assessment: Generalized weakness       Communication   Communication: HOH  Cognition Arousal/Alertness: Awake/alert Behavior During Therapy: WFL for tasks assessed/performed Overall Cognitive Status: Within Functional Limits for tasks assessed                                          General Comments General comments (skin integrity, edema, etc.): daughter entered room during session.  BP stable coming from supine to sit; HR up to 124 on EOB, RN aware; noted R foot with bruising pt reports from falling    Exercises     Assessment/Plan    PT Assessment Patient needs continued PT services  PT Problem List Decreased strength;Decreased mobility;Decreased balance;Decreased activity tolerance;Decreased knowledge of use of DME       PT Treatment Interventions DME instruction;Therapeutic activities;Therapeutic exercise;Patient/family education;Gait training;Balance training;Functional mobility training    PT Goals (Current goals can be found in the Care Plan section)  Acute Rehab PT Goals Patient Stated Goal: to return home with daughter assist PT Goal Formulation: With patient/family Time For Goal Achievement: 05/22/22 Potential to Achieve Goals: Good    Frequency Min 3X/week     Co-evaluation               AM-PAC PT "6 Clicks" Mobility  Outcome Measure Help needed turning from your back to your side while in a flat bed without using bedrails?: A Little Help needed moving from lying on your back to sitting on the side of a flat bed without using bedrails?: A Little Help needed moving to and from a bed to a chair (including a wheelchair)?: A Little Help needed standing up from a chair using your arms (e.g.,  wheelchair or bedside chair)?: A Little Help needed to walk in hospital room?: A Little Help needed climbing 3-5 steps with a railing? : Total 6 Click Score: 16    End of Session Equipment Utilized During Treatment: Gait belt Activity Tolerance: Patient limited by fatigue Patient left: in chair;with family/visitor present Nurse Communication: Mobility status PT Visit Diagnosis: Other abnormalities of gait and mobility (R26.89);History of falling (Z91.81);Muscle weakness (generalized) (M62.81)    Time: 0086-7619 PT Time Calculation (min) (ACUTE ONLY): 29 min   Charges:   PT Evaluation $PT Eval Moderate Complexity: 1 Mod PT Treatments $Gait Training: 8-22 mins        Magda Kiel, PT Acute Rehabilitation Services [phone] 05/08/2022   Reginia Naas 05/08/2022, 1:52 PM

## 2022-05-08 NOTE — Progress Notes (Signed)
PROGRESS NOTE    [name]  KGM:[id number] DOB: January 22, 1930 DOA: 05/07/2022 PCP: Burnis Medin, MD    Chief Complaint  Patient presents with   Hypoglycemia    Brief Narrative:   88 year old white female history of CKD stage IIIb, type 2 diabetes, chronic indwelling Foley, hypertension, acquired hypothyroidism, esophageal reflux, neurogenic bladder, presents to the ER today with continued hypoglycemia.  Patient had an episode of hypoglycemia at home with blood sugars about 60.  She had fallen while trying to get to her bedside commode.  Her daughter found her on the floor.  She not hit her head.  EMS was called to help get the patient up off the floor.  They noted her blood sugar to be 60.  She was given oatmeal and some food. -Patient with recent hospitalization at 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    Assessment & Plan:   Principal Problem:   Hypoglycemia Active Problems:   Hypothyroidism   GERD (gastroesophageal reflux disease)   Controlled type 2 diabetes mellitus without complication, without long-term current use of insulin (HCC)   Benign essential HTN   Stage 3b chronic kidney disease (HCC) - baseline SCr 1.4-1.8   Neurogenic bladder   Chronic indwelling Foley catheter   DNR (do not resuscitate)/DNI(Do Not Intubate)   Hypoglycemia -This is due to poor oral intake, while taking her glipizide . -Required D10 half NS, no further hypoglycemia .   Failure to thrive/deconditioning  -Patient with very poor appetite and oral intake, will keep on IV fluids for now .  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   DNR (do not resuscitate)/DNI(Do Not Intubate) Verified DNR status with pt and her dtr [name].   Chronic indwelling Foley catheter Chronic.   Neurogenic bladder Chronic.   Stage 3b chronic kidney disease (HCC) - baseline SCr 1.4-1.8 Stable. Baseline 1.4-1.8.   Benign essential HTN Stable.   Controlled type 2 diabetes mellitus without complication, without long-term current use of insulin (HCC) Takes glipizide 10 mg qam at home.   GERD (gastroesophageal reflux disease) Continue protonix 40 mg daily.   Hypothyroidism Stable.     DVT prophylaxis: Bern heparin Code Status: (DNR) Family Communication: Discussed with daughter at bedside  Status is: Observation she will likely need to be changed to inpatient given very poor oral intake    Consultants:  Palliative medicine requested  Subjective:  It is extremely frail, deconditioned with very poor appetite  Objective: Vitals:   05/08/22 1245 05/08/22 1300 05/08/22 1330 05/08/22 1424  BP: (!) 130/54 122/62 (!) 104/52 (!) 114/54  Pulse: (!) 117 (!) 115 (!) 114 (!) 105  Resp:    (!) 22  Temp:      TempSrc:      SpO2: 96% 96% 96% 96%  Weight:      Height:        Intake/Output Summary (Last 24 hours) at 05/08/2022 1517 Last data filed at 05/08/2022 1134 Gross per 24 hour  Intake 1221.29 ml  Output 350 ml  Net 871.29 ml   Filed Weights   05/07/22 1852  Weight: 56.7 kg    Examination:  Awake  Alert, Oriented X 3, extremely frail, chronically ill-appearing Symmetrical Chest wall movement, managed air entry at right lung base RRR,No Gallops,Rubs or new Murmurs, No Parasternal Heave +ve B.Sounds, Abd Soft, mild ascites No Cyanosis, Clubbing or edema, No new Rash or bruise      Data Reviewed: I have personally reviewed following labs and imaging studies  CBC: Recent Labs  Lab  05/07/22 2005  WBC 10.0  NEUTROABS 8.4*  HGB 10.4*  HCT 31.6*  MCV 84.0  PLT 409    Basic Metabolic Panel: Recent Labs  Lab 05/07/22 2005 05/08/22 0120  NA 132* 131*  K 4.6 4.5  CL 95* 94*  CO2 24 24  GLUCOSE 53* 84  BUN 32* 31*  CREATININE 1.46* 1.44*  CALCIUM 8.5* 8.4*    GFR: Estimated Creatinine Clearance: 20.8 mL/min (A) (by C-G formula based on SCr of 1.44 mg/dL (H)).  Liver Function Tests: Recent Labs  Lab 05/07/22 2005  AST 37  ALT 18  ALKPHOS 84  BILITOT 0.4  PROT 6.1*  ALBUMIN 2.5*    CBG: Recent Labs  Lab 05/08/22 0621 05/08/22 0731 05/08/22 1017 05/08/22 1151 05/08/22 1418  GLUCAP 141* 139* 238* 246* 236*     No results found for this or any previous visit (from the past 240 hour(s)).       Radiology Studies: DG Chest 2 View  Result Date: 05/07/2022 CLINICAL DATA:  Sepsis EXAM: CHEST - 2 VIEW COMPARISON:  04/29/2022 FINDINGS: Lung volumes are small. Small right pleural effusion is present with associated right basilar atelectasis. Lungs are otherwise clear. No pneumothorax. No pleural effusion on the left. Cardiac size within normal limits. Pulmonary vascularity is normal. Right total shoulder arthroplasty has been performed. Advanced degenerative changes are seen within the left shoulder. Surgical clips are seen within the right breast. IMPRESSION: 1. Small right pleural effusion with associated right basilar atelectasis. Electronically Signed   By: Fidela Salisbury M.D.   On: 05/07/2022 19:50        Scheduled Meds:  furosemide  40 mg Oral Daily   heparin  5,000 Units Subcutaneous Q8H   oxybutynin  5 mg Oral BID   pantoprazole  40 mg Oral Daily   Continuous Infusions:  lactated ringers       LOS: 0 days       Phillips Climes, MD [facility]   To contact the attending provider between 7A-7P or the covering provider during after hours 7P-7A, please log into the web site [email or web address] and access using universal  Erma password for that web site. If you do not have the password, please call the hospital operator.  05/08/2022, 3:17 PM

## 2022-05-08 NOTE — ED Notes (Signed)
Lunch tray received. Pt sitting up in chair. Daughter at bedside.

## 2022-05-08 NOTE — Progress Notes (Addendum)
Inpatient Diabetes Program Recommendations  AACE/ADA: New Consensus Statement on Inpatient Glycemic Control (2015)  Target Ranges:  Prepandial:   less than 140 mg/dL      Peak postprandial:   less than 180 mg/dL (1-2 hours)      Critically ill patients:  140 - 180 mg/dL   Lab Results  Component Value Date   GLUCAP 246 (H) 05/08/2022   HGBA1C 5.2 05/08/2022    Review of Glycemic Control  Latest Reference Range & Units 05/08/22 06:21 05/08/22 07:31 05/08/22 10:17 05/08/22 11:51  Glucose-Capillary 70 - 99 mg/dL 141 (H) 139 (H) 238 (H) 246 (H)   Diabetes history: DM 2 Outpatient Diabetes medications:  Glucotrol 10 mg daily Current orders for Inpatient glycemic control:  None Inpatient Diabetes Program Recommendations:    Note low blood sugars.  Recommend d/c of Glucotrol from home medications (A1C=5.2%) due to risk for hypoglycemia.    While in the hospital, consider adding Novolog 0-6 units tid with meals (starts at 151 mg/dL).   Thanks,  Adah Perl, RN, BC-ADM Inpatient Diabetes Coordinator Pager [phone]  (8a-5p)  Addendum [phone]- Spoke with patient's daughter and discussed likely need for d/c of Glucotrol due to risk of hypoglycemia.  MD at bedside as well.  Explained that it is okay for blood sugars to be a little higher with patients age and the importance of avoidance of hypoglycemia.  Daughter verbalized understanding.

## 2022-05-08 NOTE — ED Notes (Signed)
Nausea is improved.

## 2022-05-08 NOTE — ED Notes (Signed)
Pt cleaned. Pt's room cleaned. Full linen change. Pt resting comfortably.

## 2022-05-08 NOTE — ED Notes (Signed)
Pt's CBG 56. Pt given juice and peanut butter. MD notified.

## 2022-05-08 NOTE — ED Notes (Signed)
Nausea after eating breakfast.

## 2022-05-08 NOTE — ED Notes (Signed)
Pt began vomiting large amounts. MD notified.

## 2022-05-08 NOTE — ED Notes (Signed)
ED TO INPATIENT HANDOFF REPORT  ED Nurse Name and Phone #: Stanton Kidney, RN  S Name/Age/Gender [name] 88 y.o. female Room/Bed: 036C/036C  Code Status   Code Status: Full Code  Home/SNF/Other Skilled nursing facility Patient oriented to: self, place, time, and situation Is this baseline? Yes   Triage Complete: Triage complete  Chief Complaint Hypoglycemia [E16.2]  Triage Note Pt arrived to ED via [facility] for hypoglycemia. Pt was seen by EMS this morning for a fall no injury and CBG of 56. EMS then saw pt this evening for stroke-like s/s of slurred speech and weakness. CBG was 60 at that time. EMS gave 255m of D10 and 456mzofran. All s/s resolved after that. VSS w/ EMS. 18g R AC   Allergies Allergies  Allergen Reactions   Phenergan [Promethazine]     Slurred speech, acted very confused   Remeron [Mirtazapine] Itching    Level of Care/Admitting Diagnosis ED Disposition     ED Disposition  Admit   Condition  --   Comment  Hospital Area: [id number]  Level of Care: Telemetry Medical [104]  [facility] level of care is available:: No  Covid Evaluation: Asymptomatic - no recent exposure (last 10 days) testing not required  Diagnosis: Hypoglycemia [2[696295]Admitting Physician: CHBridgett LarssonERTullahomaAttending Physician: CHBridgett LarssonERIC [[id number]]          B Medical/Surgery History Past Medical History:  Diagnosis Date   Arthritis    Bilateral edema of lower extremity    Breast cancer of upper-outer quadrant of right female breast ([facility] 07/19/2015--- oncologist-  [name]   DCIS,  grade 3, 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 , HER2/neu negative-- s/p right lumpectomy (without SLNB) and radiation therapy   Chronic lower back pain    DDD (degenerative disc disease)    lumbar   Diverticulosis    Family history of breast cancer    Family history of pancreatic cancer     Family history of prostate cancer    Flaccid neuropathic bladder, not elsewhere classified    Foley catheter in place    GERD (gastroesophageal reflux disease)    Hemorrhoids    Hiatal hernia    History of colon polyps    History of radiation therapy 09-12-2015 to 10-12-2015   42.72 gray in 16 fractions directed right breast w/ boost of 12 gray in 6 fractions directed at the lumpectomy cavity- Total dose: 54.72y   History of recurrent UTIs    Hyperlipidemia    RBBB (right bundle branch block with left anterior fascicular block)    Type 2 diabetes mellitus ([facility]   Urinary retention with incomplete bladder emptying    Wears dentures    full upper and lower partial   Wears glasses    Wears hearing aid    bilateral but does not wear   Past Surgical History:  Procedure Laterality Date   BACK SURGERY     BREAST LUMPECTOMY WITH RADIOACTIVE SEED LOCALIZATION Right 08/03/2015   Procedure: BREAST LUMPECTOMY WITH RADIOACTIVE SEED LOCALIZATION;  Surgeon: MaRolm BookbinderMD;  [place] Service: General;  Laterality: Right;   BREAST SURGERY     CARDIOVASCULAR STRESS TEST  01/14/2012   Low risk nuclear study w/ small mild apical and apical lateral reversible perfusion defect represents a small area of ischemia versus shifting breast artifact/  normal  LV funciton and wall motion, ef 86%   CARPAL TUNNEL RELEASE Right 1977   CATARACT EXTRACTION W/ INTRAOCULAR LENS  IMPLANT, BILATERAL Bilateral left 1996/  right 1997   CLOSED RIGHT KNEE MANIPULATION  08/11/2002   post TKA   CYSTOSTOMY N/A 05/17/2016   Procedure: CYSTOSCOPY WITH  SUPRAPUBIC PLACMENT;  Surgeon: [name], MD;  Location: [place];  Service: Urology;  Laterality: N/A;   EYE SURGERY     GANGLION CYST EXCISION Right 12/09/2001   right palm   HERNIA REPAIR     JOINT REPLACEMENT     KNEE ARTHROSCOPY Right 12/14/2002   w/ Lysis Adhesions   LAMINECTOMY  04/24/2020   Bilateral redo  L2-3, L3-4 and L4-5 laminotomy/laminectomy/foraminotomies/medial facetectomy to decompress the bilateral L3, L4 and L5 nerve roots   LUMBAR LAMINECTOMY/DECOMPRESSION MICRODISCECTOMY N/A 03/06/2016   Procedure: CENTRAL DECOMPRESSION L3 - L4 ,L4 - L5;  Surgeon: [name], MD;  Location: [facility];  Service: Orthopedics;  Laterality: N/A;   SHOULDER HEMI-ARTHROPLASTY Right 02/20/2009   avascular necrosis    TOTAL HIP ARTHROPLASTY Left 06/22/2021   Procedure: LEFT TOTAL HIP ARTHROPLASTY ANTERIOR APPROACH;  Surgeon: Mcarthur Rossetti, MD;  Location: [facility];  Service: Orthopedics;  Laterality: Left;   TOTAL KNEE ARTHROPLASTY Bilateral right 06-23-2002/  left [id number]   UMBILICAL HERNIA REPAIR  1958   VAGINAL HYSTERECTOMY  1975     A IV Location/Drains/Wounds Patient Lines/Drains/Airways Status     Active Line/Drains/Airways     Name Placement date Placement time Site Days   Peripheral IV 05/07/22 18 G Right Antecubital 05/07/22  1847  Antecubital  1   Suprapubic Catheter Latex 14 Fr. 05/10/20  1520  Latex  728   Incision (Closed) 04/24/20 Back 04/24/20  1525  -- 744   Incision (Closed) 06/22/21 Hip Left 06/22/21  1142  -- 320            Intake/Output Last 24 hours  Intake/Output Summary (Last 24 hours) at 05/08/2022 0057 Last data filed at 05/07/2022 1847 Gross per 24 hour  Intake 250 ml  Output --  Net 250 ml    Labs/Imaging Results for orders placed or performed during the hospital encounter of 05/07/22 (from the past 48 hour(s))  CBG monitoring, ED     Status: Abnormal   Collection Time: 05/07/22  6:48 PM  Result Value Ref Range   Glucose-Capillary 114 (H) 70 - 99 mg/dL    Comment: Glucose reference range applies only to samples taken after fasting for at least 8 hours.   Comment 1 Notify RN    Comment 2 Document in Chart   Comprehensive metabolic panel     Status: Abnormal   Collection Time: 05/07/22  8:05 PM  Result Value Ref Range   Sodium 132 (L) 135 - 145  mmol/L   Potassium 4.6 3.5 - 5.1 mmol/L   Chloride 95 (L) 98 - 111 mmol/L   CO2 24 22 - 32 mmol/L   Glucose, Bld 53 (L) 70 - 99 mg/dL    Comment: Glucose reference range applies only to samples taken after fasting for at least 8 hours.   BUN 32 (H) 8 - 23 mg/dL   Creatinine, Ser 1.46 (H) 0.44 - 1.00 mg/dL   Calcium 8.5 (L) 8.9 - 10.3 mg/dL   Total Protein 6.1 (L) 6.5 - 8.1 g/dL   Albumin 2.5 (L) 3.5 - 5.0 g/dL   AST 37 15 - 41 U/L   ALT 18 0 -  44 U/L   Alkaline Phosphatase 84 38 - 126 U/L   Total Bilirubin 0.4 0.3 - 1.2 mg/dL   GFR, Estimated 34 (L) >60 mL/min    Comment: (NOTE) Calculated using the CKD-EPI Creatinine Equation (2021)    Anion gap 13 5 - 15    Comment: Performed at [facility] [address] [place], [place] [address]  Lactic acid, plasma     Status: None   Collection Time: 05/07/22  8:05 PM  Result Value Ref Range   Lactic Acid, Venous 1.6 0.5 - 1.9 mmol/L    Comment: Performed at [facility] [address], [place], [place] [address]  CBC with Differential     Status: Abnormal   Collection Time: 05/07/22  8:05 PM  Result Value Ref Range   WBC 10.0 4.0 - 10.5 K/uL   RBC 3.76 (L) 3.87 - 5.11 MIL/uL   Hemoglobin 10.4 (L) 12.0 - 15.0 g/dL   HCT 31.6 (L) 36.0 - 46.0 %   MCV 84.0 80.0 - 100.0 fL   MCH 27.7 26.0 - 34.0 pg   MCHC 32.9 30.0 - 36.0 g/dL   RDW 15.7 (H) 11.5 - 15.5 %   Platelets 356 150 - 400 K/uL   nRBC 0.0 0.0 - 0.2 %   Neutrophils Relative % 84 %   Neutro Abs 8.4 (H) 1.7 - 7.7 K/uL   Lymphocytes Relative 9 %   Lymphs Abs 0.9 0.7 - 4.0 K/uL   Monocytes Relative 6 %   Monocytes Absolute 0.6 0.1 - 1.0 K/uL   Eosinophils Relative 0 %   Eosinophils Absolute 0.0 0.0 - 0.5 K/uL   Basophils Relative 0 %   Basophils Absolute 0.0 0.0 - 0.1 K/uL   Immature Granulocytes 1 %   Abs Immature Granulocytes 0.09 (H) 0.00 - 0.07 K/uL    Comment: Performed at [facility] [address], [place], [place] [address]  CBG monitoring, ED      Status: Abnormal   Collection Time: 05/07/22  8:11 PM  Result Value Ref Range   Glucose-Capillary 43 (LL) 70 - 99 mg/dL    Comment: Glucose reference range applies only to samples taken after fasting for at least 8 hours.   Comment 1 Document in Chart   CBG monitoring, ED     Status: Abnormal   Collection Time: 05/07/22  9:22 PM  Result Value Ref Range   Glucose-Capillary 121 (H) 70 - 99 mg/dL    Comment: Glucose reference range applies only to samples taken after fasting for at least 8 hours.  CBG monitoring, ED     Status: Abnormal   Collection Time: 05/08/22 12:50 AM  Result Value Ref Range   Glucose-Capillary 56 (L) 70 - 99 mg/dL    Comment: Glucose reference range applies only to samples taken after fasting for at least 8 hours.   DG Chest 2 View  Result Date: 05/07/2022 CLINICAL DATA:  Sepsis EXAM: CHEST - 2 VIEW COMPARISON:  04/29/2022 FINDINGS: Lung volumes are small. Small right pleural effusion is present with associated right basilar atelectasis. Lungs are otherwise clear. No pneumothorax. No pleural effusion on the left. Cardiac size within normal limits. Pulmonary vascularity is normal. Right total shoulder arthroplasty has been performed. Advanced degenerative changes are seen within the left shoulder. Surgical clips are seen within the right breast. IMPRESSION: 1. Small right pleural effusion with associated right basilar atelectasis. Electronically Signed   By: Linwood Dibbles.D.  On: 05/07/2022 19:50    Pending Labs Unresulted Labs (From admission, onward)     Start     Ordered   05/08/22 0029  Hemoglobin A1c  Add-on,   AD        05/08/22 0028   05/07/22 1855  Urinalysis, Routine w reflex microscopic  Once,   URGENT        05/07/22 1855            Vitals/Pain Today's Vitals   05/07/22 2045 05/07/22 2319 05/08/22 0043 05/08/22 0055  BP: (!) 153/78  130/72   Pulse:   (!) 110   Resp: (!) 24  15   Temp:  97.6 F (36.4 C)  98.5 F (36.9 C)  TempSrc:  Oral   Oral  SpO2:   96%   Weight:      Height:      PainSc:        Isolation Precautions No active isolations  Medications Medications  dextrose 10 % infusion ( Intravenous Rate/Dose Change 05/08/22 0054)  pantoprazole (PROTONIX) EC tablet 40 mg (40 mg Oral Given 05/07/22 2315)  furosemide (LASIX) tablet 40 mg (has no administration in time range)  Trospium Chloride CP24 60 mg (has no administration in time range)  heparin injection 5,000 Units (5,000 Units Subcutaneous Given 05/08/22 0044)  acetaminophen (TYLENOL) tablet 650 mg (has no administration in time range)    Or  acetaminophen (TYLENOL) suppository 650 mg (has no administration in time range)  melatonin tablet 10 mg (has no administration in time range)  ALPRAZolam (XANAX) tablet 0.5 mg (0.5 mg Oral Given 05/07/22 2316)  dextrose 50 % solution 50 mL (50 mLs Intravenous Given 05/07/22 2024)    Mobility non-ambulatory Moderate fall risk   Focused Assessments Neuro Assessment Handoff:  Swallow screen pass? Yes          Neuro Assessment:   Neuro Checks:      Last Documented NIHSS Modified Score:   Has TPA been given? No If patient is a Neuro Trauma and patient is going to OR before floor call report to [place] nurse: [phone] or [phone]   R Recommendations: See Admitting Provider Note  Report given to:   Additional Notes: pt is AAOx4. Pt is on RA.

## 2022-05-08 NOTE — ED Notes (Signed)
Pt assisted back to bed with walker.

## 2022-05-09 ENCOUNTER — Inpatient Hospital Stay (HOSPITAL_COMMUNITY): Payer: Medicare Other

## 2022-05-09 DIAGNOSIS — Z66 Do not resuscitate: Secondary | ICD-10-CM | POA: Diagnosis not present

## 2022-05-09 DIAGNOSIS — J910 Malignant pleural effusion: Secondary | ICD-10-CM | POA: Diagnosis not present

## 2022-05-09 DIAGNOSIS — E162 Hypoglycemia, unspecified: Secondary | ICD-10-CM | POA: Diagnosis not present

## 2022-05-09 LAB — GLUCOSE, CAPILLARY
Glucose-Capillary: 122 mg/dL — ABNORMAL HIGH (ref 70–99)
Glucose-Capillary: 125 mg/dL — ABNORMAL HIGH (ref 70–99)
Glucose-Capillary: 124 mg/dL — ABNORMAL HIGH (ref 70–99)
Glucose-Capillary: 150 mg/dL — ABNORMAL HIGH (ref 70–99)
Glucose-Capillary: 145 mg/dL — ABNORMAL HIGH (ref 70–99)

## 2022-05-09 MED ORDER — PANTOPRAZOLE SODIUM 40 MG IV SOLR
40.0000 mg | Freq: Two times a day (BID) | INTRAVENOUS | Status: DC
  Administered 2022-05-09 – 2022-05-10 (×2): 40 mg via INTRAVENOUS
  Filled 2022-05-09 (×3): qty 10

## 2022-05-09 MED ORDER — HYDROCODONE-ACETAMINOPHEN 5-325 MG PO TABS
1.0000 | ORAL_TABLET | Freq: Once | ORAL | Status: AC
  Administered 2022-05-09: 1 via ORAL
  Filled 2022-05-09: qty 1

## 2022-05-09 MED ORDER — MAGIC MOUTHWASH
5.0000 mL | Freq: Four times a day (QID) | ORAL | Status: DC
  Administered 2022-05-09 – 2022-05-10 (×4): 5 mL via ORAL
  Filled 2022-05-09 (×8): qty 5

## 2022-05-09 MED ORDER — MORPHINE SULFATE (PF) 2 MG/ML IV SOLN
1.0000 mg | INTRAVENOUS | Status: DC | PRN
  Administered 2022-05-09: 1 mg via INTRAVENOUS
  Filled 2022-05-09: qty 1

## 2022-05-09 MED ORDER — OXYCODONE HCL 5 MG PO TABS
5.0000 mg | ORAL_TABLET | Freq: Four times a day (QID) | ORAL | Status: DC | PRN
  Administered 2022-05-09 – 2022-05-10 (×3): 5 mg via ORAL
  Filled 2022-05-09 (×4): qty 1

## 2022-05-09 NOTE — Progress Notes (Signed)
PT Cancellation/DC Note  Patient Details Name: [name] MRN: [id number] DOB: Jul 03, 1930   Cancelled Treatment:    Reason Eval/Treat Not Completed: Other (comment). Pt transitioning to comfort care and to residential hospice.   [place] 05/09/2022, 4:11 PM Organ Office [phone]

## 2022-05-09 NOTE — TOC Initial Note (Addendum)
Transition of Care [facility]) - Initial/Assessment Note    Patient Details  Name: [name] MRN: [id number] Date of Birth: [birth year]  [facility]) CM/SW Contact:    Benard Halsted, LCSW Phone Number: 05/09/2022, 2:54 PM  Clinical Narrative:                 2:54pm-CSW received consult for hospice facility placement. CSW spoke with patient's daughter at bedside and she reported agreement with plan but wants to make her daughter aware first because she is not sure if she should choose [place] or [place] where they live. She stated she will call CSW when she has had time to consider which would be best for family and for patient's church friends.   4:45pm-Patient's daughter requests hospice facility in [place]. CSW provided referral to [facility] with [facility] [place]. She will review in the morning and follow up.  Expected Discharge Plan: [place] Barriers to Discharge: Continued Medical Work up   Patient Goals and CMS Choice Patient states their goals for this hospitalization and ongoing recovery are:: Comfort CMS Medicare.gov Compare Post Acute Care list provided to:: Patient Represenative (must comment) Choice offered to / list presented to : Adult Children  Expected Discharge Plan and Services Expected Discharge Plan: [place] In-house Referral: Clinical Social Work, Hospice / [place] Acute Care Choice: Hospice Living arrangements for the past 2 months: Single Family Home                                      Prior Living Arrangements/Services Living arrangements for the past 2 months: Single Family Home Lives with:: Adult Children Patient language and need for interpreter reviewed:: Yes Do you feel safe going back to the place where you live?: Yes      Need for Family Participation in Patient Care: Yes (Comment) Care giver support system in place?: Yes (comment)   Criminal Activity/Legal  Involvement Pertinent to Current Situation/Hospitalization: No - Comment as needed  Activities of Daily Living      Permission Sought/Granted Permission sought to share information with : Facility Sport and exercise psychologist, Family Supports Permission granted to share information with : Yes, Verbal Permission Granted  Share Information with NAME: [name]  Permission granted to share info w AGENCY: Hospice  Permission granted to share info w Relationship: Daughter  Permission granted to share info w Contact Information: [phone]  Emotional Assessment Appearance:: Appears stated age Attitude/Demeanor/Rapport: Engaged Affect (typically observed): Accepting, Appropriate Orientation: : Oriented to Self, Oriented to Place, Oriented to  Time, Oriented to Situation Alcohol / Substance Use: Not Applicable Psych Involvement: No (comment)  Admission diagnosis:  Hypoglycemia [E16.2] Patient Active Problem List   Diagnosis Date Noted   Hypoglycemia 05/07/2022   Chronic indwelling Foley catheter 05/07/2022   DNR (do not resuscitate)/DNI(Do Not Intubate) 05/07/2022   Status post total replacement of left hip 06/22/2021   Unilateral primary osteoarthritis, left hip 04/23/2021   Peripheral edema 07/11/2020   Sleep disturbance 06/12/2020   Drug induced constipation    Neurogenic bladder    Constipation    Hypoalbuminemia due to protein-calorie malnutrition (HCC)    Benign essential HTN    Stage 3b chronic kidney disease ([place]) - baseline SCr 1.4-1.8    Chronic pain syndrome    Controlled type 2 diabetes mellitus with hyperglycemia, without long-term current use of insulin ([place])  Lumbar radiculopathy 05/02/2020   Spondylolisthesis of lumbar region 04/24/2020   Urinary retention with incomplete bladder emptying    Hip pain    Severe back pain    AKI (acute kidney injury) ([place])    Intractable pain/ left hip osteoarthritis 12/25/2019   Spinal stenosis, lumbar region, with neurogenic  claudication 03/06/2016   Malignant neoplasm of right female breast ([place]) 01/11/2016   Controlled type 2 diabetes mellitus without complication, without long-term current use of insulin ([place]) 01/11/2016   Genetic testing 08/18/2015   Family history of breast cancer    Family history of prostate cancer    Family history of pancreatic cancer    Breast cancer of upper-outer quadrant of right female breast ([place]) 07/21/2015   Insomnia 05/06/2015   Medication management 12/16/2013   Hand arthritis 12/16/2013   Cold intolerance 06/17/2013   Post-menopause 12/23/2012   Hyperlipidemia 12/23/2012   Postmenopausal HRT (hormone replacement therapy) 12/23/2012   History of shingles 12/23/2012   Wears hearing aid 12/23/2012   Nonspecific abnormal unspecified cardiovascular function study 02/28/2012   Chronic sore throat 01/13/2012   Hoarse 01/13/2012   Itching  mid back 12/03/2011   Abnormal EKG 12/03/2011   Medicare annual wellness visit, subsequent 12/03/2011   DJD (degenerative joint disease) 12/03/2011   DIABETES MELLITUS, TYPE II, CONTROLLED 11/15/2009   VITAMIN D DEFICIENCY 11/15/2009   EXOGENOUS OBESITY 11/15/2009   ANXIETY DEPRESSION 12/22/2008   HYPERTHYROIDISM 08/23/2008   TOTAL KNEE REPLACEMENT, LEFT, HX OF 08/23/2008   Hypothyroidism 10/08/2007   ANEMIA 10/08/2007   Osteoarthritis 10/08/2007   OSTEOPENIA 10/08/2007   Dyslipidemia 11/07/2006   GERD (gastroesophageal reflux disease) 11/07/2006   PCP:  Burnis Medin, MD Pharmacy:   [facility] [place] [address] Phone: [phone] Fax: [place], [place] - [address] [place] [address] Phone: [phone] Fax: [phone]     Social Determinants of Health (SDOH) Interventions    Readmission Risk Interventions     No  data to display

## 2022-05-09 NOTE — Plan of Care (Signed)
  Problem: Education: Goal: Knowledge of General Education information will improve Description Including pain rating scale, medication(s)/side effects and non-pharmacologic comfort measures Outcome: Progressing   

## 2022-05-09 NOTE — Progress Notes (Signed)
  Transition of Care (TOC) Screening Note   Patient Details  Name: [name] Date of Birth: 04/16/1930   [facility] CM/SW Contact:    Cyndi Bender, RN Phone Number: 05/09/2022, 9:10 AM    [facility] has reviewed patient and no TOC needs have been identified at this time. We will continue to monitor patient advancement through interdisciplinary progression rounds. If new patient transition needs arise, please place a TOC consult.  Has used [facility] and  been to [facility] in the past.

## 2022-05-09 NOTE — Progress Notes (Signed)
Mobility Specialist Progress Note:   05/09/22 0930  Mobility  Activity  (bed level ex (limited))  Assistive Device None  Range of Motion/Exercises Active;All extremities  Activity Response Tolerated fair  Mobility Referral Yes  $Mobility charge 1 Mobility   Pt received in significant RLE pain, bruising noted. Pending XR per pt. Pt eager to work with mobility, however deferring OOB mobility at this time. Performed multiple bed level exercises, pt tolerated well. Left with all needs met.   [place]elta Numbers Acute Rehab Secure Chat or Office Phone: [phone]

## 2022-05-09 NOTE — Progress Notes (Signed)
PROGRESS NOTE    [name]  TKZ:[id number] DOB: [birth year] DOA: 05/07/2022 PCP: Burnis Medin, MD    Chief Complaint  Patient presents with   Hypoglycemia    Brief Narrative:   88 year old white female history of CKD stage IIIb, type 2 diabetes, chronic indwelling Foley, hypertension, acquired hypothyroidism, esophageal reflux, neurogenic bladder, presents to the ER today with continued hypoglycemia.  Patient had an episode of hypoglycemia at home with blood sugars about 60.  She had fallen while trying to get to her bedside commode.  Her daughter found her on the floor.  She not hit her head.  EMS was called to help get the patient up off the floor.  They noted her blood sugar to be 60.  She was given oatmeal and some food. 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    Assessment & Plan:   Principal Problem:   Hypoglycemia Active Problems:   Hypothyroidism   GERD (gastroesophageal reflux disease)   Controlled type 2 diabetes mellitus without complication, without long-term current use of insulin (HCC)   Benign essential HTN   Stage 3b chronic kidney disease (HCC) - baseline SCr 1.4-1.8   Neurogenic bladder   Chronic indwelling Foley catheter   DNR (do not resuscitate)/DNI(Do Not Intubate)   Hypoglycemia -This is due to poor oral intake, while taking her glipizide . -Required D10 half NS, no further hypoglycemia .   Failure to thrive/deconditioning  -Patient with very poor appetite and oral intake, will keep on IV fluids for now .  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 -Patient is extremely frail, deconditioned, after discussion with the family plan to proceed with hospice.     DNR (do not resuscitate)/DNI(Do Not Intubate) Verified DNR status with pt and her dtr [name].   Chronic indwelling Foley catheter Chronic.   Neurogenic bladder Chronic.   Stage 3b chronic kidney disease (HCC) - baseline SCr 1.4-1.8 Stable. Baseline 1.4-1.8.   Benign essential HTN Stable.   Controlled type 2 diabetes mellitus without complication, without long-term current use of insulin (HCC) Takes glipizide 10 mg qam at home.   GERD (gastroesophageal reflux disease) Continue protonix 40 mg daily.   Hypothyroidism Stable.   1,3,4 toe fractures -Secondary to fall at home, discussed with orthopedic, medical management with postop shoe and pain control, weight bearing as tolerated  DVT prophylaxis: [place] heparin Code Status: (DNR) Family Communication: Discussed with daughter at bedside  Disposition>> likely residential hospice Consultants:  Palliative medicine requested  Subjective:  It is extremely frail, deconditioned with very poor appetite  Objective: Vitals:   05/08/22 2311 05/09/22 0304 05/09/22 0758 05/09/22 1200  BP: 108/65 (!) 76/36  (!) 132/55  Pulse: (!) 102 100  98  Resp: '18 20  18  '$ Temp: 98.1 F (36.7 C) 98 F (36.7 C) 98.7 F (37.1 C) 98.2 F (36.8 C)  TempSrc: Oral Oral Oral Oral  SpO2: 96% 91% 93% (P) 95%  Weight:      Height:        Intake/Output Summary (Last 24 hours) at  05/09/2022 1447 Last data filed at 05/08/2022 1851 Gross per 24 hour  Intake 40.61 ml  Output --  Net 40.61 ml   Filed Weights   05/07/22 1852  Weight: 56.7 kg    Examination:    Awake Alert, Oriented X 3, extremely frail, deconditioned, chronically ill-appearing Symmetrical  Chest wall movement, diminished air entry at the bases RRR,No Gallops,Rubs or new Murmurs, No Parasternal Heave +ve B.Sounds, Abd Soft, relating ascites  no Cyanosis, Clubbing or edema, toes tender to palpation   Data Reviewed: I have personally reviewed following labs and imaging studies  CBC: Recent Labs  Lab 05/07/22 2005  WBC 10.0  NEUTROABS 8.4*  HGB 10.4*  HCT 31.6*  MCV 84.0  PLT 914    Basic Metabolic Panel: Recent Labs  Lab 05/07/22 2005 05/08/22 0120  NA 132* 131*  K 4.6 4.5  CL 95* 94*  CO2 24 24  GLUCOSE 53* 84  BUN 32* 31*  CREATININE 1.46* 1.44*  CALCIUM 8.5* 8.4*    GFR: Estimated Creatinine Clearance: 20.8 mL/min (A) (by C-G formula based on SCr of 1.44 mg/dL (H)).  Liver Function Tests: Recent Labs  Lab 05/07/22 2005  AST 37  ALT 18  ALKPHOS 84  BILITOT 0.4  PROT 6.1*  ALBUMIN 2.5*    CBG: Recent Labs  Lab 05/08/22 2048 05/08/22 2331 05/09/22 0358 05/09/22 0758 05/09/22 1258  GLUCAP 137* 135* 122* 125* 124*     No results found for this or any previous visit (from the past 240 hour(s)).       Radiology Studies: DG Ribs Unilateral Left  Result Date: 05/09/2022 CLINICAL DATA:  Recent fall with left rib pain, initial encounter EXAM: LEFT RIBS - 2 VIEW COMPARISON:  None Available. FINDINGS: No fracture or other bone lesions are seen involving the ribs. IMPRESSION: No acute rib fracture noted. Electronically Signed   By: Inez Catalina M.D.   On: 05/09/2022 11:56   DG Foot Complete Right  Result Date: 05/09/2022 CLINICAL DATA:  Recent fall with first toe pain and swelling, initial encounter EXAM: RIGHT FOOT COMPLETE - 3+ VIEW COMPARISON:  None Available. FINDINGS: Adenoid displaced fracture at the base of the first proximal phalanx is noted. Additionally some increased sclerosis is noted which may be related to an impacted fracture in the distal aspect of the first phalanx. Mildly impacted fractures in the distal aspect of the third  and fourth proximal phalanges are seen as well. No gross soft tissue abnormality is seen. Calcaneal spurring is noted. IMPRESSION: Fractures involving the first, third and fourth proximal phalanges as described. Electronically Signed   By: Inez Catalina M.D.   On: 05/09/2022 11:55   DG Chest 2 View  Result Date: 05/07/2022 CLINICAL DATA:  Sepsis EXAM: CHEST - 2 VIEW COMPARISON:  04/29/2022 FINDINGS: Lung volumes are small. Small right pleural effusion is present with associated right basilar atelectasis. Lungs are otherwise clear. No pneumothorax. No pleural effusion on the left. Cardiac size within normal limits. Pulmonary vascularity is normal. Right total shoulder arthroplasty has been performed. Advanced degenerative changes are seen within the left shoulder. Surgical clips are seen within the right breast. IMPRESSION: 1. Small right pleural effusion with associated right basilar atelectasis. Electronically Signed   By: Fidela Salisbury M.D.   On: 05/07/2022 19:50        Scheduled Meds:  furosemide  40 mg Oral Daily   heparin  5,000 Units Subcutaneous Q8H   insulin aspart  0-6 Units Subcutaneous TID WC  magic mouthwash  5 mL Oral QID   oxybutynin  5 mg Oral BID   pantoprazole (PROTONIX) IV  40 mg Intravenous Q12H   Continuous Infusions:  lactated ringers 50 mL/hr at 05/08/22 1851     LOS: 1 day       Phillips Climes, MD [facility]   To contact the attending provider between 7A-7P or the covering provider during after hours 7P-7A, please log into the web site [email or web address] and access using universal [place] password for that web site. If you do not have the password, please call the hospital operator.  05/09/2022, 2:47 PM

## 2022-05-10 DIAGNOSIS — N1832 Chronic kidney disease, stage 3b: Secondary | ICD-10-CM | POA: Diagnosis not present

## 2022-05-10 DIAGNOSIS — Z66 Do not resuscitate: Secondary | ICD-10-CM | POA: Diagnosis not present

## 2022-05-10 DIAGNOSIS — E162 Hypoglycemia, unspecified: Secondary | ICD-10-CM | POA: Diagnosis not present

## 2022-05-10 DIAGNOSIS — Z7189 Other specified counseling: Secondary | ICD-10-CM

## 2022-05-10 DIAGNOSIS — Z978 Presence of other specified devices: Secondary | ICD-10-CM | POA: Diagnosis not present

## 2022-05-10 DIAGNOSIS — Z515 Encounter for palliative care: Secondary | ICD-10-CM

## 2022-05-10 LAB — GLUCOSE, CAPILLARY
Glucose-Capillary: 111 mg/dL — ABNORMAL HIGH (ref 70–99)
Glucose-Capillary: 112 mg/dL — ABNORMAL HIGH (ref 70–99)
Glucose-Capillary: 124 mg/dL — ABNORMAL HIGH (ref 70–99)

## 2022-05-10 MED ORDER — GLYCOPYRROLATE 1 MG PO TABS: 1.0000 mg | ORAL_TABLET | ORAL | Status: DC | PRN

## 2022-05-10 MED ORDER — MORPHINE SULFATE (PF) 2 MG/ML IV SOLN: 1.0000 mg | INTRAVENOUS | 0 refills | Status: DC | PRN

## 2022-05-10 MED ORDER — ONDANSETRON HCL 4 MG/2ML IJ SOLN: 4.0000 mg | Freq: Four times a day (QID) | INTRAMUSCULAR | 0 refills | Status: DC | PRN

## 2022-05-10 MED ORDER — BISACODYL 10 MG RE SUPP: 10.0000 mg | Freq: Every day | RECTAL | Status: DC | PRN

## 2022-05-10 MED ORDER — OXYCODONE HCL 5 MG PO TABS: 5.0000 mg | ORAL_TABLET | Freq: Four times a day (QID) | ORAL | 0 refills | Status: DC | PRN

## 2022-05-10 MED ORDER — FUROSEMIDE 40 MG PO TABS: 40.0000 mg | ORAL_TABLET | Freq: Every day | ORAL | Status: DC

## 2022-05-10 MED ORDER — GLYCOPYRROLATE 0.2 MG/ML IJ SOLN: 0.2000 mg | INTRAMUSCULAR | Status: DC | PRN

## 2022-05-10 MED ORDER — MORPHINE SULFATE (CONCENTRATE) 10 MG/0.5ML PO SOLN: 2.5000 mg | Freq: Four times a day (QID) | ORAL | 0 refills | Status: DC

## 2022-05-10 MED ORDER — LORAZEPAM 2 MG/ML IJ SOLN: 0.5000 mg | INTRAMUSCULAR | Status: DC | PRN

## 2022-05-10 MED ORDER — MORPHINE SULFATE (CONCENTRATE) 10 MG/0.5ML PO SOLN
2.5000 mg | Freq: Four times a day (QID) | ORAL | Status: DC
  Administered 2022-05-10: 2.6 mg via ORAL
  Filled 2022-05-10: qty 0.5

## 2022-05-10 MED ORDER — BISACODYL 5 MG PO TBEC: 5.0000 mg | DELAYED_RELEASE_TABLET | Freq: Every day | ORAL | Status: DC | PRN

## 2022-05-10 MED ORDER — POLYVINYL ALCOHOL 1.4 % OP SOLN: 1.0000 [drp] | Freq: Four times a day (QID) | OPHTHALMIC | Status: DC | PRN

## 2022-05-10 NOTE — Consult Note (Signed)
[place]: Pt has been approved for transfer to [facility]. [address], [place], [place] [address]- [facility] direct number for report- [phone]- Can go today with first available transport.  Thank you for the opportunity to serve this patient and family.  Sharalyn Ink, RN [facility] [phone]

## 2022-05-10 NOTE — Consult Note (Signed)
[place]: Referral made for [facility] inpatient unit yesterday afternoon. Return call from daughter at 11:25am who wishes to proceed with referral to [facility]. Pending MD review/approval at this time. Sharalyn Ink, RN [facility] [phone]

## 2022-05-10 NOTE — Plan of Care (Signed)
  Problem: Education: Goal: Knowledge of General Education information will improve Description: Including pain rating scale, medication(s)/side effects and non-pharmacologic comfort measures Outcome: Progressing   Problem: Health Behavior/Discharge Planning: Goal: Ability to manage health-related needs will improve Outcome: Not Progressing   Problem: Clinical Measurements: Goal: Ability to maintain clinical measurements within normal limits will improve Outcome: Progressing Goal: Will remain free from infection Outcome: Progressing Goal: Diagnostic test results will improve Outcome: Progressing Goal: Respiratory complications will improve Outcome: Progressing Goal: Cardiovascular complication will be avoided Outcome: Progressing   Problem: Nutrition: Goal: Adequate nutrition will be maintained Outcome: Progressing   Problem: Elimination: Goal: Will not experience complications related to bowel motility Outcome: Progressing Goal: Will not experience complications related to urinary retention Outcome: Progressing   Problem: Pain Managment: Goal: General experience of comfort will improve Outcome: Progressing   Problem: Safety: Goal: Ability to remain free from injury will improve Outcome: Progressing   Problem: Skin Integrity: Goal: Risk for impaired skin integrity will decrease Outcome: Progressing   Problem: Education: Goal: Ability to describe self-care measures that may prevent or decrease complications (Diabetes Survival Skills Education) will improve Outcome: Progressing Goal: Individualized Educational Video(s) Outcome: Progressing   Problem: Coping: Goal: Ability to adjust to condition or change in health will improve Outcome: Progressing   Problem: Fluid Volume: Goal: Ability to maintain a balanced intake and output will improve Outcome: Progressing   Problem: Health Behavior/Discharge Planning: Goal: Ability to identify and utilize available  resources and services will improve Outcome: Progressing Goal: Ability to manage health-related needs will improve Outcome: Progressing   Problem: Nutritional: Goal: Maintenance of adequate nutrition will improve Outcome: Progressing Goal: Progress toward achieving an optimal weight will improve Outcome: Progressing   Problem: Skin Integrity: Goal: Risk for impaired skin integrity will decrease Outcome: Progressing   Problem: Tissue Perfusion: Goal: Adequacy of tissue perfusion will improve Outcome: Progressing

## 2022-05-10 NOTE — Discharge Instructions (Signed)
Management per [facility]

## 2022-05-10 NOTE — Consult Note (Signed)
Palliative Medicine Inpatient [name] Note  Consulting Provider: Dr. Waldron Labs  Reason for [name]:    Answer  [name]  Reason for [name]? Ovarian Cancer OVARIAN CANCER   05/10/2022  HPI:  Per intake H&P --> 88 year old white female history of CKD stage IIIb, type 2 diabetes, chronic indwelling Foley, hypertension, acquired hypothyroidism, esophageal reflux, neurogenic bladder, presents to the ER today with continued hypoglycemia - newly identified to have  high-grade serous carcinoma from a cytology report sent at [facility].   Palliative care has been asked to get involved to further aid in goals of care conversations.   Clinical Assessment/Goals of Care:  *Please note that this is a verbal dictation therefore any spelling or grammatical errors are due to the "[employer]" system interpretation.  I have reviewed medical records including EPIC notes, labs and imaging, received report from bedside RN, assessed the patient who is sleeping though arousable.    I met with patients daughter, [name] to further discuss diagnosis prognosis, GOC, EOL wishes, disposition and options.   I introduced Palliative Medicine as specialized medical care for people living with serious illness. It focuses on providing relief from the symptoms and stress of a serious illness. The goal is to improve quality of life for both the patient and the family.  Medical History Review and Understanding:  A discussion of patient's past medical history inclusive of her prior breast cancer, indwelling Foley catheter after back surgery in 2017, high blood pressure, and diabetes was had.  Social History:  Daizha is from [place] [facility] originally.  She is a widow and had 2 children though her son passed away 15 years ago.  Her daughter, [name] is her primary caregiver.  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  She enjoys spending time with her family.  She is a woman of the [place].  Functional and Nutritional State:  Prior to hospitalization, [name] was cared for by [facility].  They would split time between [place] home and [place]'s home.  She was able to mobilize with a walker.  She had had a diminishing appetite though would force herself to eat 3 meals a day.  Advance Directives:  A detailed discussion was had today regarding advanced directives.  Patient does have advanced directives which can be found in [place].  Code Status:  [name] is an established DNAR/DNI code status.   Discussion:  We reviewed patient's events leading to hospitalization.  She was affected by COVID-19 last year.  She had hip surgery in January and after that due to her chronic debility ended up being cared for by her daughter.  Her daughter shares that she had had worsening shortness of breath with mobility leading to hospitalization and identification of pleural effusions as well as ascites.  A thoracentesis and paracentesis were performed and cells were sent for cytology.  She had preceding hospitalization fallen and was found to be hypoglycemic.  Her daughter shares that she was adverse to going to the hospital.  It was later on when she was having slurred speech after a nap that they enacted EMS to bring her to the hospital.  Per patient's daughter she was advised by the ER physician to gain the patient's records from [facility].  It was after these were reviewed that it was identified she has stage IV ovarian cancer.  Patient's daughter asks if ovarian cancer can still be developed although patient had had a hysterectomy.  We reviewed that it can.  She shares that everything that is occurring is so shocking to her which I provided time and space for her to express.  Discussed the options of pursuit of comfort care and for [name] to  go to inpatient hospice. We talked about transition to comfort measures in house and what that would entail inclusive of medications to control pain, dyspnea, agitation, nausea, itching, and hiccups.  We discussed stopping all uneccessary measures such as cardiac monitoring, blood draws, needle sticks, and frequent vital signs. Utilized reflective listening throughout our time together.   Was able to speak to [facility] about this directly as well.  Plan to transition to comfort care and patient's daughter to speak to hospice of the [place].  Discussed the importance of continued conversation with family and their  medical providers regarding overall plan of care and treatment options, ensuring decisions are within the context of the patients values and GOCs.  Decision Maker: [name] Daughter [phone]  [phone]    SUMMARY OF RECOMMENDATIONS   DNAR/DNI  Change focus to comfort oriented care  We will add low-dose morphine around-the-clock to support dyspnea and pain  Appreciate TOC facilitating hospice of the [facility] evaluation  Ongoing palliative support  Code Status/Advance Care Planning: DNAR/DNI  Palliative Prophylaxis:  Aspiration, Bowel Regimen, Delirium Protocol, Frequent Pain Assessment, Oral Care, Palliative Wound Care, and Turn Reposition  Additional Recommendations (Limitations, Scope, Preferences): Comfort focus care  Psycho-social/Spiritual:  Desire for further Chaplaincy support: Not at this time Additional Recommendations: Discussion in the setting of patient's advanced cancer   Prognosis: Patient's time is limited to weeks in the setting of her of her lack of p.o. intake, weakening state, recurrent ascites.  Discharge Planning: Discharge to inpatient hospice per primary team  Vitals:   05/09/22 2336 05/10/22 0302  BP: (!) 92/50 (!) 103/52  Pulse: 100 100  Resp: 15 12  Temp: 98.4 F (36.9 C) 98.3 F (36.8 C)  SpO2: 93% 91%    Intake/Output  Summary (Last 24 hours) at 05/10/2022 0998 Last data filed at 05/09/2022 1330 Gross per 24 hour  Intake 360 ml  Output --  Net 360 ml   Last Weight  Most recent update: 05/07/2022  6:53 PM    Weight  56.7 kg (125 lb)            Gen: Frail elderly occasion female in no acute distress HEENT: moist mucous membranes CV: Irregular rate and regular rhythm PULM: On room air breathing is even and nonlabored ABD: Distended EXT: No edema Neuro: Alert and oriented x2-3  PPS: 20%   This conversation/these recommendations were discussed with patient primary care team, Dr. Waldron Labs  Billing based on MDM: High  Problems Addressed: One acute or chronic illness or injury that poses a threat to life or bodily function  Amount and/or Complexity of Data: Category 3:Discussion of management or test interpretation with external physician/other qualified health care professional/appropriate source (not separately reported)  Risks: Decision regarding hospitalization or escalation of hospital care and Decision not to resuscitate or to de-escalate care because of poor prognosis ______________________________________________________ [place] Team Team Cell Phone: [phone] Please utilize secure chat with additional questions, if there is no response within 30 minutes please call the above phone number  Palliative Medicine Team providers are available by phone from 7am to 7pm daily and can be reached through the  team cell phone.  Should this patient require assistance outside of these hours, please call the patient's attending physician.

## 2022-05-10 NOTE — Discharge Summary (Signed)
Physician Discharge Summary  [name] ESL:[id number] DOB: [birth year] DOA: 05/07/2022  PCP: Burnis Medin, MD  Admit date: 05/07/2022 Discharge date: 05/10/2022  Admitted From: Home Disposition:  [facility])  Recommendations for Outpatient Follow-up:  Management Per [facility] hospice   Discharge Condition: ( hospice) CODE STATUS: ( DNR, Comfort Care)  Brief/Interim Summary:  88 year old white female history of CKD stage IIIb, type 2 diabetes, chronic indwelling Foley, hypertension, acquired hypothyroidism, esophageal reflux, neurogenic bladder, presents to the ER today with continued hypoglycemia.  Patient had an episode of hypoglycemia at home with blood sugars about 60.  She had fallen while trying to get to her bedside commode.  Her daughter found her on the floor.  She not hit her head.  EMS was called to help get the patient up off the floor.  They noted her blood sugar to be 60.  She was given oatmeal and some food. 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   Hypoglycemia -This is due to poor oral intake, while taking her glipizide . -Required D10 half NS, no further hypoglycemia .   Failure to thrive/deconditioning .   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 -Patient is extremely frail, deconditioned, after discussion with the family plan to proceed with hospice.     DNR (do not  resuscitate)/DNI(Do Not Intubate)  Chronic indwelling Foley catheter Chronic.   Neurogenic bladder Chronic.   Stage 3b chronic kidney disease (HCC) - baseline SCr 1.4-1.8    Benign essential HTN    Controlled type 2 diabetes mellitus without complication, without long-term current use of insulin (HCC) Takes glipizide 10 mg qam at home.   GERD (gastroesophageal reflux disease)    Hypothyroidism    1,3,4 left  toe fractures -Secondary to fall at home, discussed with orthopedic, medical management with postop shoe and pain control, weight bearing as tolerated -Continue with pain management   Goals of care -Patient extremely frail, with multiple comorbidities, appears to be in stage IV metastatic disease with most likely ovarian cancer is the primary, given her functional status and comorbidities , palliative medicine has been consulted, and plan to proceed with full comfort measures, and discharged to hospice facility, [place] [facility] will have bed available today.     Discharge Diagnoses:  Principal Problem:   Hypoglycemia Active Problems:   Hypothyroidism   GERD (gastroesophageal reflux disease)   Controlled type 2 diabetes mellitus without complication, without long-term current use of insulin (HCC)   Benign essential HTN   Stage 3b chronic kidney disease (HCC) - baseline SCr 1.4-1.8   Neurogenic bladder   Chronic indwelling Foley catheter   DNR (do not resuscitate)/DNI(Do Not Intubate)   Hospice care patient   Palliative care patient    Discharge Instructions  Discharge Instructions     Diet - low sodium heart healthy   Complete by: As directed    Discharge instructions   Complete by: As directed    Management per [facility] hospice   Increase activity slowly   Complete by: As directed    No wound  care   Complete by: As directed       Allergies as of 05/10/2022       Reactions   Phenergan [promethazine]    Slurred speech, acted very confused    Remeron [mirtazapine] Itching        Medication List     STOP taking these medications    aspirin 81 MG chewable tablet   aspirin EC 81 MG tablet   atorvastatin 10 MG tablet Commonly known as: LIPITOR   cholecalciferol 25 MCG (1000 UNIT) tablet Commonly known as: VITAMIN D3   cyanocobalamin 1000 MCG tablet   glipiZIDE 10 MG tablet Commonly known as: GLUCOTROL   MiraLax 17 GM/SCOOP powder Generic drug: polyethylene glycol powder   Trospium Chloride 60 MG Cp24       TAKE these medications    acetaminophen 650 MG CR tablet Commonly known as: TYLENOL Take 650 mg by mouth every 8 (eight) hours as needed for pain.   ALPRAZolam 1 MG tablet Commonly known as: XANAX Take 0.5-1 tablets (0.5-1 mg total) by mouth at bedtime as needed for anxiety. Avoid regular use. What changed: when to take this   colchicine 0.6 MG tablet Take 1 tablet (0.6 mg total) by mouth daily. May increase to twice a day for gout attacks What changed: additional instructions   docusate sodium 100 MG capsule Commonly known as: COLACE Take 1 capsule (100 mg total) by mouth 2 (two) times daily. What changed: when to take this   esomeprazole 40 MG capsule Commonly known as: NEXIUM TAKE 1 CAPSULE DAILY   furosemide 40 MG tablet Commonly known as: LASIX Take 1 tablet (40 mg total) by mouth daily. Start taking on: May 11, 2022 What changed: See the new instructions.   glycopyrrolate 1 MG tablet Commonly known as: ROBINUL Take 1 tablet (1 mg total) by mouth every 4 (four) hours as needed (excessive secretions).   morphine CONCENTRATE 10 MG/0.5ML Soln concentrated solution Take 0.13 mLs (2.6 mg total) by mouth every 6 (six) hours.   morphine (PF) 2 MG/ML injection Inject 0.5 mLs (1 mg total) into the vein every 2 (two) hours as needed.   ondansetron 4 MG/2ML Soln injection Commonly known as: ZOFRAN Inject 2 mLs (4 mg total) into the vein every 6 (six) hours as needed for vomiting or  nausea.   oxyCODONE 5 MG immediate release tablet Commonly known as: Oxy IR/ROXICODONE Take 1 tablet (5 mg total) by mouth every 6 (six) hours as needed for breakthrough pain.        Allergies  Allergen Reactions   Phenergan [Promethazine]     Slurred speech, acted very confused   Remeron [Mirtazapine] Itching    Consultations: Palliative medicine   Procedures/Studies: DG Ribs Unilateral Left  Result Date: 05/09/2022 CLINICAL DATA:  Recent fall with left rib pain, initial encounter EXAM: LEFT RIBS - 2 VIEW COMPARISON:  None Available. FINDINGS: No fracture or other bone lesions are seen involving the ribs. IMPRESSION: No acute rib fracture noted. Electronically Signed   By: Inez Catalina M.D.   On: 05/09/2022 11:56   DG Foot Complete Right  Result Date: 05/09/2022 CLINICAL DATA:  Recent fall with first toe pain and swelling, initial encounter EXAM: RIGHT FOOT COMPLETE - 3+ VIEW COMPARISON:  None Available. FINDINGS: Adenoid displaced fracture at the base of the first proximal phalanx is noted. Additionally some increased sclerosis is noted which may be related to an impacted fracture in the distal aspect of the first phalanx. Mildly  impacted fractures in the distal aspect of the third and fourth proximal phalanges are seen as well. No gross soft tissue abnormality is seen. Calcaneal spurring is noted. IMPRESSION: Fractures involving the first, third and fourth proximal phalanges as described. Electronically Signed   By: Inez Catalina M.D.   On: 05/09/2022 11:55   DG Chest 2 View  Result Date: 05/07/2022 CLINICAL DATA:  Sepsis EXAM: CHEST - 2 VIEW COMPARISON:  04/29/2022 FINDINGS: Lung volumes are small. Small right pleural effusion is present with associated right basilar atelectasis. Lungs are otherwise clear. No pneumothorax. No pleural effusion on the left. Cardiac size within normal limits. Pulmonary vascularity is normal. Right total shoulder arthroplasty has been performed.  Advanced degenerative changes are seen within the left shoulder. Surgical clips are seen within the right breast. IMPRESSION: 1. Small right pleural effusion with associated right basilar atelectasis. Electronically Signed   By: Fidela Salisbury M.D.   On: 05/07/2022 19:50   ECHOCARDIOGRAM COMPLETE  Result Date: 04/19/2022    ECHOCARDIOGRAM REPORT   Patient Name:   [name] Date of Exam: 04/19/2022 Medical Rec #:  [id number]       Height:       60.5 in Accession #:    [id number]      Weight:       122.6 lb Date of Birth:  [birth year]       BSA:          1.525 m Patient Age:    42 years        BP:           140/70 mmHg Patient Gender: F               HR:           134 bpm. Exam Location:  Outpatient Procedure: 2D Echo, 3D Echo, Strain Analysis, Cardiac Doppler and Color Doppler Indications:    R06.02 SOB  History:        Patient has prior history of Echocardiogram examinations, most                 recent 04/13/2020. Abnormal ECG; Risk Factors:Hypertension,                 Diabetes, Non-Smoker and Dyslipidemia. Chest pressure.  Sonographer:    Leavy Cella RDCS Referring Phys: [place]  1. Left ventricular ejection fraction, by estimation, is 60 to 65%. The left ventricle has normal function. The left ventricle has no regional wall motion abnormalities. Left ventricular diastolic parameters are indeterminate.  2. Right ventricular systolic function is normal. The right ventricular size is normal. There is normal pulmonary artery systolic pressure. The estimated right ventricular systolic pressure is 82.5 mmHg.  3. The mitral valve is abnormal. Trivial mitral valve regurgitation. Mild mitral stenosis. The mean mitral valve gradient is 5.0 mmHg with average heart rate of 102 bpm. Severe mitral annular calcification.  4. The aortic valve is tricuspid. Aortic valve regurgitation is not visualized. Aortic valve sclerosis/calcification is present, without any evidence of aortic stenosis.   5. The inferior vena cava is normal in size with greater than 50% respiratory variability, suggesting right atrial pressure of 3 mmHg.  6. There is an echodensity in the right atrium. Appears present on prior echo 04/2020. Increases in size with atrial contraction. Suspect prominent crista terminalis FINDINGS  Left Ventricle: Left ventricular ejection fraction, by estimation, is 60 to 65%. The left ventricle has normal function. The left ventricle  has no regional wall motion abnormalities. The left ventricular internal cavity size was normal in size. There is  no left ventricular hypertrophy. Left ventricular diastolic parameters are indeterminate. Right Ventricle: The right ventricular size is normal. No increase in right ventricular wall thickness. Right ventricular systolic function is normal. There is normal pulmonary artery systolic pressure. The tricuspid regurgitant velocity is 2.01 m/s, and  with an assumed right atrial pressure of 3 mmHg, the estimated right ventricular systolic pressure is 92.4 mmHg. Left Atrium: Left atrial size was normal in size. Right Atrium: Right atrial size was normal in size. Pericardium: Trivial pericardial effusion is present. Presence of epicardial fat layer. Mitral Valve: The mitral valve is abnormal. Severe mitral annular calcification. Trivial mitral valve regurgitation. Mild mitral valve stenosis. The mean mitral valve gradient is 5.0 mmHg with average heart rate of 102 bpm. Tricuspid Valve: The tricuspid valve is normal in structure. Tricuspid valve regurgitation is trivial. Aortic Valve: The aortic valve is tricuspid. Aortic valve regurgitation is not visualized. Aortic valve sclerosis/calcification is present, without any evidence of aortic stenosis. Pulmonic Valve: The pulmonic valve was grossly normal. Pulmonic valve regurgitation is trivial. Aorta: The aortic root and ascending aorta are structurally normal, with no evidence of dilitation. Venous: The inferior vena  cava is normal in size with greater than 50% respiratory variability, suggesting right atrial pressure of 3 mmHg. IAS/Shunts: The interatrial septum was not well visualized.  LEFT VENTRICLE PLAX 2D LVIDd:         4.08 cm   Diastology LVIDs:         2.77 cm   LV e' medial:    3.92 cm/s LV PW:         0.87 cm   LV E/e' medial:  20.9 LV IVS:        0.78 cm   LV e' lateral:   5.04 cm/s LVOT diam:     1.70 cm   LV E/e' lateral: 16.3 LV SV:         24 LV SV Index:   16 LVOT Area:     2.27 cm  RIGHT VENTRICLE RV Basal diam:  3.15 cm RV Mid diam:    2.67 cm RV S prime:     10.30 cm/s TAPSE (M-mode): 1.0 cm LEFT ATRIUM             Index        RIGHT ATRIUM          Index LA diam:        3.10 cm 2.03 cm/m   RA Area:     5.83 cm LA Vol (A2C):   41.8 ml 27.40 ml/m  RA Volume:   9.77 ml  6.40 ml/m LA Vol (A4C):   29.3 ml 19.21 ml/m LA Biplane Vol: 35.4 ml 23.21 ml/m  AORTIC VALVE LVOT Vmax:   71.00 cm/s LVOT Vmean:  45.700 cm/s LVOT VTI:    0.106 m  AORTA Ao Root diam: 3.40 cm Ao Asc diam:  3.00 cm MITRAL VALVE                TRICUSPID VALVE MV Area (PHT): 6.27 cm     TR Peak grad:   16.2 mmHg MV Mean grad:  5.0 mmHg     TR Vmax:        201.00 cm/s MV Decel Time: 121 msec MV E velocity: 82.10 cm/s   SHUNTS MV A velocity: 187.00 cm/s  Systemic VTI:  0.11 m MV E/A  ratio:  0.44         Systemic Diam: 1.70 cm Oswaldo Milian MD Electronically signed by Oswaldo Milian MD Signature Date/Time: 04/19/2022/9:06:58 PM    Final       Subjective:  She is still complaining of right toe pain, but overall reports she is comfortable.  Discharge Exam: Vitals:   05/10/22 0302 05/10/22 0800  BP: (!) 103/52 (!) 122/53  Pulse: 100 100  Resp: 12 16  Temp: 98.3 F (36.8 C) 98.5 F (36.9 C)  SpO2: 91% 94%   Vitals:   05/09/22 2000 05/09/22 2336 05/10/22 0302 05/10/22 0800  BP: (!) 103/56 (!) 92/50 (!) 103/52 (!) 122/53  Pulse: (!) 107 100 100 100  Resp: '16 15 12 16  '$ Temp: 98.3 F (36.8 C) 98.4 F (36.9 C)  98.3 F (36.8 C) 98.5 F (36.9 C)  TempSrc: Oral Oral Oral Oral  SpO2: 95% 93% 91% 94%  Weight:      Height:        General: Pt is alert, awake, teamly frail, deconditioned. Cardiovascular: RRR, S1/S2 +, no rubs, no gallops Respiratory: Diminished air entry at the bases Abdominal: Soft, NT, mild ascites, bowel sounds + Extremities: no edema, bruising in her right toes    The results of significant diagnostics from this hospitalization (including imaging, microbiology, ancillary and laboratory) are listed below for reference.     Microbiology: No results found for this or any previous visit (from the past 240 hour(s)).   Labs: BNP (last 3 results) No results for input(s): "BNP" in the last 8760 hours. Basic Metabolic Panel: Recent Labs  Lab 05/07/22 2005 05/08/22 0120  NA 132* 131*  K 4.6 4.5  CL 95* 94*  CO2 24 24  GLUCOSE 53* 84  BUN 32* 31*  CREATININE 1.46* 1.44*  CALCIUM 8.5* 8.4*   Liver Function Tests: Recent Labs  Lab 05/07/22 2005  AST 37  ALT 18  ALKPHOS 84  BILITOT 0.4  PROT 6.1*  ALBUMIN 2.5*   No results for input(s): "LIPASE", "AMYLASE" in the last 168 hours. No results for input(s): "AMMONIA" in the last 168 hours. CBC: Recent Labs  Lab 05/07/22 2005  WBC 10.0  NEUTROABS 8.4*  HGB 10.4*  HCT 31.6*  MCV 84.0  PLT 356   Cardiac Enzymes: No results for input(s): "CKTOTAL", "CKMB", "CKMBINDEX", "TROPONINI" in the last 168 hours. BNP: Invalid input(s): "POCBNP" CBG: Recent Labs  Lab 05/09/22 1642 05/09/22 2112 05/10/22 0000 05/10/22 0334 05/10/22 0942  GLUCAP 150* 145* 124* 111* 112*   D-Dimer No results for input(s): "DDIMER" in the last 72 hours. Hgb A1c Recent Labs    05/08/22 0120  HGBA1C 5.2   Lipid Profile No results for input(s): "CHOL", "HDL", "LDLCALC", "TRIG", "CHOLHDL", "LDLDIRECT" in the last 72 hours. Thyroid function studies No results for input(s): "TSH", "T4TOTAL", "T3FREE", "THYROIDAB" in the last 72  hours.  Invalid input(s): "FREET3" Anemia work up No results for input(s): "VITAMINB12", "FOLATE", "FERRITIN", "TIBC", "IRON", "RETICCTPCT" in the last 72 hours. Urinalysis    Component Value Date/Time   COLORURINE YELLOW 03/24/2016 0906   APPEARANCEUR TURBID (A) 03/24/2016 0906   LABSPEC 1.008 03/24/2016 0906   PHURINE 7.0 03/24/2016 0906   GLUCOSEU NEGATIVE 03/24/2016 0906   HGBUR MODERATE (A) 03/24/2016 0906   HGBUR negative 11/15/2009 0000   BILIRUBINUR Negative 02/16/2020 1230   KETONESUR NEGATIVE 03/24/2016 0906   PROTEINUR Positive (A) 02/16/2020 1230   PROTEINUR NEGATIVE 03/24/2016 0906   UROBILINOGEN 0.2 02/16/2020 1230  UROBILINOGEN 0.2 11/15/2009 0000   NITRITE Positive 02/16/2020 1230   NITRITE POSITIVE (A) 03/24/2016 0906   LEUKOCYTESUR Large (3+) (A) 02/16/2020 1230   Sepsis Labs Recent Labs  Lab 05/07/22 2005  WBC 10.0   Microbiology No results found for this or any previous visit (from the past 240 hour(s)).   Time coordinating discharge: Over 30 minutes  SIGNED:   Phillips Climes, MD  [facility] 05/10/2022, 1:50 PM Pager   If 7PM-7AM, please contact night-coverage [email or web address]

## 2022-05-10 NOTE — TOC Transition Note (Signed)
Transition of Care [facility]) - CM/SW Discharge Note   Patient Details  Name: [name] MRN: [id number] Date of Birth: [birth year]  Transition of Care [facility]) CM/SW Contact:  Benard Halsted, LCSW Phone Number: 05/10/2022, 2:24 PM   Clinical Narrative:    Patient will DC to: [facility] [place] [facility] Anticipated DC date: 05/10/22 Family notified: Daughter Transport by: Corey Harold   Per MD patient ready for DC to [facility]. RN to call report prior to discharge [phone]). RN, patient, patient's family, and facility notified of DC. Discharge Summary sent to facility. DC packet on chart. Ambulance transport requested for patient.   CSW will sign off for now as social work intervention is no longer needed. Please consult [place] again if new needs arise.     Final next level of care: [place] Barriers to Discharge: Barriers Resolved   Patient Goals and CMS Choice Patient states their goals for this hospitalization and ongoing recovery are:: Comfort CMS Medicare.gov Compare Post Acute Care list provided to:: Patient Represenative (must comment) Choice offered to / list presented to : Adult Children  Discharge Placement                Patient to be transferred to facility by: [place] Name of family member notified: Daughter Patient and family notified of of transfer: 05/10/22  Discharge Plan and Services In-house Referral: Clinical Social Work, Hospice / Palliative Care   Post Acute Care Choice: Hospice                               Social Determinants of Health (SDOH) Interventions Housing Interventions: Intervention Not Indicated   Readmission Risk Interventions     No data to display

## 2022-05-15 ENCOUNTER — Inpatient Hospital Stay: Payer: Medicare Other | Admitting: Internal Medicine

## 2022-05-17 ENCOUNTER — Encounter: Payer: Self-pay | Admitting: Internal Medicine

## 2022-05-20 NOTE — Telephone Encounter (Signed)
I wont have time right now for a  record review  please team investigate and find  what test  she is talking about  and let me  and her know

## 2022-05-20 NOTE — Telephone Encounter (Signed)
Spoke to daughter, [name]. She informs that her mother passed away on 10-07-22 morning. Was admitted to hospice at [facility]. Added they found out patient had Ovarian cancer stage 4. She has no information about when the test was done last at the moment.

## 2022-05-22 ENCOUNTER — Encounter: Payer: Self-pay | Admitting: Internal Medicine

## 2022-05-23 ENCOUNTER — Telehealth: Payer: Self-pay | Admitting: Internal Medicine

## 2022-05-23 NOTE — Telephone Encounter (Signed)
Done

## 2022-05-23 NOTE — Telephone Encounter (Signed)
Review of record   She did have genetic testing  and counseling in 2017  ( negative for specific high risk) please get copy of tetsing and genetic counseling results to  family .

## 2022-05-23 NOTE — Telephone Encounter (Signed)
[facility] called to say Pt passed away on 2022/05/21.

## 2022-05-23 NOTE — Telephone Encounter (Signed)
Condolences to family    please send written sympathy [place] note.

## 2022-06-07 DEATH — deceased
